# Patient Record
Sex: Female | Born: 1960 | Race: White | Hispanic: No | Smoking: Current every day smoker
Health system: Southern US, Community
[De-identification: ages and names within clinical notes are randomized; demographics above are authoritative.]

## PROBLEM LIST (undated history)

## (undated) DIAGNOSIS — K219 Gastro-esophageal reflux disease without esophagitis: Secondary | ICD-10-CM

## (undated) DIAGNOSIS — J449 Chronic obstructive pulmonary disease, unspecified: Secondary | ICD-10-CM

## (undated) DIAGNOSIS — G8929 Other chronic pain: Secondary | ICD-10-CM

## (undated) DIAGNOSIS — Z860100 Personal history of colon polyps, unspecified: Secondary | ICD-10-CM

## (undated) DIAGNOSIS — I10 Essential (primary) hypertension: Secondary | ICD-10-CM

## (undated) DIAGNOSIS — M169 Osteoarthritis of hip, unspecified: Secondary | ICD-10-CM

## (undated) DIAGNOSIS — E119 Type 2 diabetes mellitus without complications: Secondary | ICD-10-CM

## (undated) DIAGNOSIS — J45909 Unspecified asthma, uncomplicated: Secondary | ICD-10-CM

## (undated) DIAGNOSIS — C801 Malignant (primary) neoplasm, unspecified: Secondary | ICD-10-CM

## (undated) DIAGNOSIS — Z972 Presence of dental prosthetic device (complete) (partial): Secondary | ICD-10-CM

## (undated) DIAGNOSIS — Z8709 Personal history of other diseases of the respiratory system: Secondary | ICD-10-CM

## (undated) DIAGNOSIS — Z720 Tobacco use: Secondary | ICD-10-CM

## (undated) DIAGNOSIS — I5030 Unspecified diastolic (congestive) heart failure: Secondary | ICD-10-CM

## (undated) DIAGNOSIS — E785 Hyperlipidemia, unspecified: Secondary | ICD-10-CM

## (undated) DIAGNOSIS — R002 Palpitations: Secondary | ICD-10-CM

## (undated) DIAGNOSIS — R0600 Dyspnea, unspecified: Secondary | ICD-10-CM

## (undated) DIAGNOSIS — R531 Weakness: Secondary | ICD-10-CM

## (undated) DIAGNOSIS — I739 Peripheral vascular disease, unspecified: Secondary | ICD-10-CM

## (undated) DIAGNOSIS — Z8619 Personal history of other infectious and parasitic diseases: Secondary | ICD-10-CM

## (undated) DIAGNOSIS — M549 Dorsalgia, unspecified: Secondary | ICD-10-CM

## (undated) HISTORY — PX: COLONOSCOPY: SHX174

## (undated) HISTORY — DX: Essential (primary) hypertension: I10

## (undated) HISTORY — PX: ABDOMINAL HYSTERECTOMY: SHX81

## (undated) HISTORY — DX: Osteoarthritis of hip, unspecified: M16.9

## (undated) HISTORY — DX: Hyperlipidemia, unspecified: E78.5

## (undated) HISTORY — PX: APPENDECTOMY: SHX54

## (undated) HISTORY — PX: BACK SURGERY: SHX140

## (undated) HISTORY — PX: BREAST BIOPSY: SHX20

## (undated) HISTORY — PX: TOTAL ABDOMINAL HYSTERECTOMY W/ BILATERAL SALPINGOOPHORECTOMY: SHX83

## (undated) HISTORY — PX: EYE SURGERY: SHX253

---

## 2007-07-02 ENCOUNTER — Ambulatory Visit: Payer: Self-pay

## 2007-07-09 ENCOUNTER — Ambulatory Visit: Payer: Self-pay

## 2007-08-15 ENCOUNTER — Emergency Department: Payer: Self-pay | Admitting: Emergency Medicine

## 2010-02-23 ENCOUNTER — Ambulatory Visit: Payer: Self-pay | Admitting: Family Medicine

## 2010-02-23 DIAGNOSIS — F17219 Nicotine dependence, cigarettes, with unspecified nicotine-induced disorders: Secondary | ICD-10-CM | POA: Insufficient documentation

## 2010-03-08 ENCOUNTER — Ambulatory Visit: Payer: Self-pay | Admitting: Internal Medicine

## 2010-03-08 DIAGNOSIS — R519 Headache, unspecified: Secondary | ICD-10-CM | POA: Insufficient documentation

## 2010-03-08 DIAGNOSIS — R51 Headache: Secondary | ICD-10-CM | POA: Insufficient documentation

## 2010-05-04 NOTE — Assessment & Plan Note (Signed)
Summary: COLD/JBB   Vital Signs:  Patient Profile:   50 Years Old Female CC:      Cold & URI symptoms Height:     67 inches Weight:      216 pounds BMI:     33.95 O2 Sat:      95 % O2 treatment:    Room Air Temp:     97.4 degrees F oral Pulse rate:   93 / minute Pulse rhythm:   regular Resp:     20 per minute BP sitting:   129 / 91  (right arm)  Pt. in pain?   no  Vitals Entered By: Levonne Spiller EMT-P (February 23, 2010 12:40 PM)              Is Patient Diabetic? No Comments Pt. is a smoker. 1 pack per day.      Current Allergies: No known allergies History of Present Illness History from: patient Chief Complaint: Cold & URI symptoms History of Present Illness: The patient is reporting that she has had a "cold" for the last 2 weeks and her husband made her come in to get evaluated because she was coughing so much that he couldn't sleep.  She says that she is coughing up yellow sputum and denies fever and chills.  She is having some mild SOB.  No CP.  No emesis.  Pt says that she has been wheezing and has not been using her albuterol and advair that she has at home.  She has fallen out with her PCP and says that she may not be following up there.  She says that they want her to come in too regularly for medical care and treatment.  She has a history of smoking 1ppd of cigarettes for 37+ years.  She denies having a history of COPD/Emphysema but no records are available to confirm/verify.    REVIEW OF SYSTEMS Constitutional Symptoms      Denies fever, chills, night sweats, weight loss, weight gain, and fatigue.  Eyes       Denies change in vision, eye pain, eye discharge, glasses, contact lenses, and eye surgery. Ear/Nose/Throat/Mouth       Complains of frequent runny nose and hoarseness.      Denies hearing loss/aids, change in hearing, ear pain, ear discharge, dizziness, frequent nose bleeds, sinus problems, sore throat, and tooth pain or bleeding.  Respiratory  Complains of productive cough, wheezing, and shortness of breath.      Denies dry cough, asthma, bronchitis, and emphysema/COPD.      Comments: Colored Sputum Cardiovascular       Denies murmurs, chest pain, and tires easily with exhertion.    Gastrointestinal       Denies stomach pain, nausea/vomiting, diarrhea, constipation, blood in bowel movements, and indigestion. Genitourniary       Denies painful urination, kidney stones, and loss of urinary control. Neurological       Denies paralysis, seizures, and fainting/blackouts. Musculoskeletal       Denies muscle pain, joint pain, joint stiffness, decreased range of motion, redness, swelling, muscle weakness, and gout.  Skin       Denies bruising, unusual mles/lumps or sores, and hair/skin or nail changes.  Psych       Denies mood changes, temper/anger issues, anxiety/stress, speech problems, depression, and sleep problems.  Past History:  Family History: Last updated: 02/23/2010 Pt denies.  Social History: Last updated: 02/23/2010 Married Current Smoker - 1 ppd for 37+ years Drug use-no Regular  exercise-no  Risk Factors: Exercise: no (02/23/2010)  Risk Factors: Smoking Status: current (02/23/2010)  Past Medical History: ? COPD Chronic Active Nicotine Dependence  Past Surgical History: Denies surgical history  Family History: Pt denies.  Social History: Married Current Smoker - 1 ppd for 37+ years Drug use-no Regular exercise-no Smoking Status:  current Drug Use:  no Does Patient Exercise:  no Physical Exam General appearance: well developed, well nourished, no acute distress Head: normocephalic, atraumatic Eyes: conjunctivae and lids normal Pupils: equal, round, reactive to light Ears: normal, no lesions or deformities Nasal: pale, boggy, swollen nasal turbinates Oral/Pharynx: tongue normal, posterior pharynx without erythema or exudate Neck: neck supple,  trachea midline, no masses Chest/Lungs:  scattered wheezes throughout all lobes, rales at both bases Heart: regular rate and  rhythm, no murmur Abdomen: soft, non-tender without obvious organomegaly Extremities: normal extremities Neurological: grossly intact and non-focal Skin: no obvious rashes or lesions MSE: oriented to time, place, and person Assessment Problems:   New Problems: ? of OBSTRUCTIVE CHRONIC BRONCHITIS WITH EXACERBATION (ICD-491.21) ALLERGIC RHINITIS, SEASONAL (ICD-477.0) ACUTE BRONCHITIS (ICD-466.0) CIGARETTE SMOKER (ICD-305.1)   Patient Education: Patient and/or caregiver instructed in the following: rest, fluids. The risks, benefits and possible side effects were clearly explained and discussed with the patient.  The patient verbalized clear understanding.  The patient was given instructions to return if symptoms don't improve, worsen or new changes develop.  If it is not during clinic hours and the patient cannot get back to this clinic then the patient was told to seek medical care at an available urgent care or emergency department.  The patient verbalized understanding.   Demonstrates willingness to comply.  Plan New Medications/Changes: MEDROL (PAK) 4 MG TABS (METHYLPREDNISOLONE) take 1 dose pak as directed  #1 pak x 0, 02/23/2010, Clanford Johnson MD DOXYCYCLINE HYCLATE 100 MG CAPS (DOXYCYCLINE HYCLATE) take 1 by mouth two times a day with food until completed - avoid heavy sun exposure  #14 x 0, 02/23/2010, Standley Dakins MD  New Orders: Nebulizer Tx [phone] Planning Comments:   The patient was counseled and advised to stop using all tobacco products.  Medical assistance was offered and the patient was encouraged to call 1-800-QUIT-NOW to get a smoking cessation coach.    Follow Up: Follow up in 1-2 days if no improvement, Follow up on an as needed basis, Follow up with Primary Physician  The patient and/or caregiver has been counseled thoroughly with regard to medications prescribed including  dosage, schedule, interactions, rationale for use, and possible side effects and they verbalize understanding.  Diagnoses and expected course of recovery discussed and will return if not improved as expected or if the condition worsens. Patient and/or caregiver verbalized understanding.  Prescriptions: MEDROL (PAK) 4 MG TABS (METHYLPREDNISOLONE) take 1 dose pak as directed  #1 pak x 0   Entered and Authorized by:   Standley Dakins MD   Signed by:   Standley Dakins MD on 02/23/2010   Method used:   Electronically to        [facility] (retail)       [address]       [place], [place]  [address]       Ph: [phone]       Fax: [phone]   RxID:   [id number] DOXYCYCLINE HYCLATE 100 MG CAPS (DOXYCYCLINE HYCLATE) take 1 by mouth two times a day with food until completed - avoid heavy sun exposure  #14  x 0   Entered and Authorized by:   Standley Dakins MD   Signed by:   Standley Dakins MD on 02/23/2010   Method used:   Electronically to        [facility] (retail)       [address]       [place], [place]  [address]       Ph: [phone]       Fax: [phone]   RxID:   [id number]   The patient received 2 nebulizer treatments with albuterol/ipratropium solution and tolerated the treatments very well.  I reexamined the patient after the treatments and her pulse ox improved to 96-97% and lungs were clear and coughing stopped.    Patient Instructions: 1)  Tobacco is very bad for your health and your loved ones! You Should stop smoking!. 2)  Stop Smoking Tips: Choose a Quit date. Cut down before the Quit date. decide what you will do as a substitute when you feel the urge to smoke(gum,toothpick,exercise). 3)  Take your antibiotic as prescribed until ALL of it is gone, but stop if you develop a rash or swelling and contact our office as soon as possible. 4)  The patient was encouraged to get a flu  vaccine when she is feeling better.  The patient verbalized clear understanding.   5)  The patient was informed that there is no on-call provider or services available at this clinic during off-hours (when the clinic is closed).  If the patient developed a problem or concern that required immediate attention, the patient was advised to go the the nearest available urgent care or emergency department for medical care.  The patient verbalized understanding.    6)  It was clearly explained to the patient that this [facility] is not intended to be a primary care clinic.  The patient is always better served by the continuity of care and the provider/patient relationships developed with their dedicated primary care provider.  The patient was told to be sure to follow up as soon as possible with their primary care provider to discuss treatments received and to receive further examination and testing.  The patient verbalized understanding. The will f/u with PCP ASAP.   Orders Added: 1)  [facility]x [phone]

## 2010-05-04 NOTE — Assessment & Plan Note (Signed)
Summary: BAD COLD/EVM   Vital Signs:  Patient profile:   50 year old female Height:      67.5 inches Weight:      217 pounds BMI:     33.61 O2 Sat:      98 % on Room air Temp:     97.7 degrees F oral Pulse rate:   85 / minute Pulse rhythm:   regular Resp:     20 per minute BP sitting:   146 / 87  (right arm)  Vitals Entered By: Levonne Spiller EMT-P (March 08, 2010 11:20 AM) CC: URI/ Cold Syptoms Is Patient Diabetic? No Pain Assessment Patient in pain? no       Does patient need assistance? Functional Status Self care Ambulation Normal Comments Pt. Is A Smoker. 1 Pack Per Day.   CC:  URI/ Cold Syptoms.  Allergies (verified): No Known Drug Allergies  Past History:  Past Medical History: Last updated: 02/23/2010 ? COPD Chronic Active Nicotine Dependence  Past Surgical History: back surg  Family History: denies  Social History: Married Current Smoker - 1 ppd for 37+ years Drug use-no Regular exercise-no later says she tries to walk 1 mile daily disabled to to back problems on diet and has lost from max of 226 lbs  Review of Systems       The patient complains of weight loss, hoarseness, dyspnea on exertion, prolonged cough, and headaches.  The patient denies anorexia, fever, weight gain, chest pain, syncope, peripheral edema, hemoptysis, abdominal pain, melena, hematochezia, and severe indigestion/heartburn.   General:  Denies chills, fatigue, fever, loss of appetite, malaise, sleep disorder, sweats, weakness, and weight loss. Eyes:  Complains of blurring; denies discharge, double vision, eye irritation, eye pain, halos, itching, light sensitivity, red eye, vision loss-1 eye, and vision loss-both eyes; chronic and unchanged . ENT:  Complains of hoarseness, nasal congestion, and sore throat; nasal worse if supine requiring 4 pillows for 4 mos. denies sob if mouth breathing. CV:  Complains of difficulty breathing at night, difficulty breathing while lying  down, fatigue, and shortness of breath with exertion; denies bluish discoloration of lips or nails, chest pain or discomfort, fainting, leg cramps with exertion, lightheadness, near fainting, palpitations, swelling of feet, swelling of hands, and weight gain; fatigue chronic and unchanged   . Resp:  Complains of cough, morning headaches, sputum productive, and wheezing; denies chest discomfort, chest pain with inspiration, coughing up blood, excessive snoring, hypersomnolence, pleuritic, and shortness of breath; green sputum. denies dx of emphysema or copd in past. GI:  Denies abdominal pain, bloody stools, change in bowel habits, constipation, dark tarry stools, diarrhea, excessive appetite, gas, hemorrhoids, indigestion, loss of appetite, nausea, vomiting, vomiting blood, and yellowish skin color. GU:  Denies abnormal vaginal bleeding, decreased libido, discharge, dysuria, genital sores, hematuria, incontinence, nocturia, urinary frequency, and urinary hesitancy. MS:  Complains of low back pain; denies joint pain, joint redness, joint swelling, loss of strength, mid back pain, muscle aches, muscle , cramps, muscle weakness, stiffness, and thoracic pain; chronic and unchanged . Derm:  Denies changes in color of skin, changes in nail beds, dryness, excessive perspiration, flushing, hair loss, insect bite(s), itching, lesion(s), poor wound healing, and rash. Neuro:  Complains of headaches; denies brief paralysis, difficulty with concentration, disturbances in coordination, falling down, inability to speak, memory loss, numbness, poor balance, seizures, sensation of room spinning, tingling, tremors, visual disturbances, and weakness; generalized, responds to tylenol. 6/day max. Psych:  Denies alternate hallucination ( auditory/visual), anxiety, depression,  easily angered, easily tearful, irritability, mental problems, panic attacks, sense of great danger, suicidal thoughts/plans, thoughts of violence, unusual  visions or sounds, and thoughts /plans of harming others. Endo:  Denies cold intolerance, excessive hunger, excessive thirst, excessive urination, heat intolerance, polyuria, and weight change. Heme:  Denies abnormal bruising, bleeding, enlarge lymph nodes, fevers, pallor, and skin discoloration. Allergy:  Denies hives or rash, itching eyes, persistent infections, seasonal allergies, and sneezing; denies dx of allergies and prior seasons OK.  Physical Exam  General:  alert, well-developed, well-hydrated, appropriate dress, cooperative to examination, and overweight-appearing.   Head:  normocephalic and atraumatic.   Eyes:  no injection.   Ears:  External ear exam shows no significant lesions or deformities.  Otoscopic examination reveals clear canals, tympanic membranes are intact bilaterally without bulging, retraction, inflammation or discharge. Hearing is grossly normal bilaterally. Nose:  External nasal examination shows no deformity or inflammation. Nasal mucosa are pink and moist without lesions or exudates. Mouth:  Oral mucosa and oropharynx without lesions or exudates.   Neck:  No deformities, masses, or tenderness noted. Chest Wall:  No deformities, noted. Lungs:  normal respiratory effort and normal breath sounds.   Extremities:  No clubbing, cyanosis, edema, or deformity noted .   Neurologic:  No cranial nerve deficits noted. Station and gait are normal. motor and coordinative functions appear intact. Skin:  turgor normal and color normal.   Cervical Nodes:  No lymphadenopathy noted Psych:  Cognition and judgment appear intact. Alert and cooperative with normal attention span and concentration. No apparent delusions, illusions, hallucinations. patient admits she will never try to stop smoking due to past failures   Problems:  Medical Problems Added: 1)  Dx of Headache  (ICD-784.0) 2)  Dx of Cough  (ICD-786.2)  Impression & Recommendations:  Problem # 1:  COUGH  (ICD-786.2) patient is better after medrol and doxy but still worse than baseline and denies smokers cough. thinks her husband has given her back his new resp infection. Denies knowledge of copd and understands the natural progression of this if smoking continues.  Problem # 2:  HEADACHE (ICD-784.0) admits she get this with acute illness and sometimes related to chronic back pain. ha is less severe with less coughing. denies hbp dx.  Complete Medication List: 1)  Azithromycin 250 Mg Tabs (Azithromycin) .... 2 then 1 by mouth qd  Patient Instructions: 1)  coricidin hbp as needed. 2)  afrin generic 2 sprays in each nostril every 12 hours for 3 days maximum followed by 3 days of non-use. Continue cycle as needed to control nasal or ear congestion . 3)  robitussin dm or delsym. 4)  continue tylenol. 5)  get blood pressure rechecked and a flu shot when better 6)  Please schedule an appointment with your primary doctor in :10 days if not well. Prescriptions: AZITHROMYCIN 250 MG TABS (AZITHROMYCIN) 2 then 1 by mouth qd  #6 x 0   Entered and Authorized by:   J. Juline Patch MD   Signed by:   Shela Commons. Juline Patch MD on 03/08/2010   Method used:   Electronically to        [facility] (retail)       [address]       [place], [place]  [address]       Ph: [phone]       Fax: [phone]   RxID:   [id number]

## 2011-06-28 DIAGNOSIS — B958 Unspecified staphylococcus as the cause of diseases classified elsewhere: Secondary | ICD-10-CM | POA: Insufficient documentation

## 2011-06-28 DIAGNOSIS — T847XXA Infection and inflammatory reaction due to other internal orthopedic prosthetic devices, implants and grafts, initial encounter: Secondary | ICD-10-CM | POA: Insufficient documentation

## 2013-07-02 ENCOUNTER — Ambulatory Visit
Admission: RE | Admit: 2013-07-02 | Discharge: 2013-07-02 | Disposition: A | Discharge status: Home / Self Care | Payer: Medicare Other | Source: Ambulatory Visit | Attending: Internal Medicine | Admitting: Internal Medicine

## 2013-07-02 ENCOUNTER — Encounter: Payer: Self-pay | Admitting: Internal Medicine

## 2013-07-02 ENCOUNTER — Ambulatory Visit (INDEPENDENT_AMBULATORY_CARE_PROVIDER_SITE_OTHER): Payer: Medicare Other | Admitting: Internal Medicine

## 2013-07-02 VITALS — BP 131/84 | HR 87 | Temp 98.6°F | Ht 67.0 in | Wt 214.5 lb

## 2013-07-02 DIAGNOSIS — I739 Peripheral vascular disease, unspecified: Secondary | ICD-10-CM | POA: Insufficient documentation

## 2013-07-02 DIAGNOSIS — E785 Hyperlipidemia, unspecified: Secondary | ICD-10-CM | POA: Insufficient documentation

## 2013-07-02 DIAGNOSIS — M539 Dorsopathy, unspecified: Secondary | ICD-10-CM | POA: Insufficient documentation

## 2013-07-02 DIAGNOSIS — M5430 Sciatica, unspecified side: Secondary | ICD-10-CM

## 2013-07-02 DIAGNOSIS — E119 Type 2 diabetes mellitus without complications: Secondary | ICD-10-CM

## 2013-07-02 DIAGNOSIS — Z981 Arthrodesis status: Secondary | ICD-10-CM | POA: Insufficient documentation

## 2013-07-02 DIAGNOSIS — I10 Essential (primary) hypertension: Secondary | ICD-10-CM

## 2013-07-02 DIAGNOSIS — T8140XA Infection following a procedure, unspecified, initial encounter: Secondary | ICD-10-CM | POA: Insufficient documentation

## 2013-07-02 LAB — COMPREHENSIVE METABOLIC PANEL
ALK PHOS: 83 U/L (ref 39–117)
ALT: 45 U/L — ABNORMAL HIGH (ref 0–35)
AST: 38 U/L — ABNORMAL HIGH (ref 0–37)
Albumin: 4.6 g/dL (ref 3.5–5.2)
BUN: 13 mg/dL (ref 6–23)
CALCIUM: 9.8 mg/dL (ref 8.4–10.5)
CO2: 25 mEq/L (ref 19–32)
Chloride: 101 mEq/L (ref 96–112)
Creat: 0.79 mg/dL (ref 0.50–1.10)
GLUCOSE: 97 mg/dL (ref 70–99)
Potassium: 4.8 mEq/L (ref 3.5–5.3)
Sodium: 136 mEq/L (ref 135–145)
TOTAL PROTEIN: 7.1 g/dL (ref 6.0–8.3)
Total Bilirubin: 0.3 mg/dL (ref 0.2–1.2)

## 2013-07-02 LAB — CBC
HEMATOCRIT: 40.2 % (ref 36.0–46.0)
Hemoglobin: 13.8 g/dL (ref 12.0–15.0)
MCH: 30.5 pg (ref 26.0–34.0)
MCHC: 34.3 g/dL (ref 30.0–36.0)
MCV: 88.9 fL (ref 78.0–100.0)
Platelets: 338 10*3/uL (ref 150–400)
RBC: 4.52 MIL/uL (ref 3.87–5.11)
RDW: 13.3 % (ref 11.5–15.5)
WBC: 9.9 10*3/uL (ref 4.0–10.5)

## 2013-07-02 LAB — C-REACTIVE PROTEIN: CRP: 0.7 mg/dL — ABNORMAL HIGH (ref <0.60)

## 2013-07-02 LAB — SEDIMENTATION RATE: SED RATE: 10 mm/h (ref 0–22)

## 2013-07-02 MED ORDER — SULFAMETHOXAZOLE-TMP DS 800-160 MG PO TABS: 1.0000 | ORAL_TABLET | Freq: Two times a day (BID) | ORAL | Status: DC

## 2013-07-02 NOTE — Progress Notes (Signed)
Patient ID: Stacey Gross, female   DOB: 1960-04-05, 53 y.o.   MRN: [id number]         [facility]  Reason for Consult: History of postoperative lumbar staph infection Referring Physician: Dr. Golden Pop  Patient Active Problem List   Diagnosis Date Noted  . Sciatica 07/02/2013    Priority: High  . S/P lumbar fusion 07/02/2013    Priority: Medium  . Post-operative infection 07/02/2013    Priority: Medium  . Type II or unspecified type diabetes mellitus without mention of complication, not stated as uncontrolled   . Unspecified essential hypertension   . Other and unspecified hyperlipidemia   . CIGARETTE SMOKER 02/23/2010    Patient's Medications  New Prescriptions   No medications on file  Previous Medications   ACETAMINOPHEN (TYLENOL) 325 MG TABLET    Take 650 mg by mouth every 6 (six) hours as needed.   METFORMIN (GLUCOPHAGE) 500 MG TABLET    Take by mouth 2 (two) times daily.   OMEPRAZOLE (PRILOSEC) 20 MG CAPSULE    Take 30 mg by mouth daily.  Modified Medications   Modified Medication Previous Medication   SULFAMETHOXAZOLE-TRIMETHOPRIM (BACTRIM DS) 800-160 MG PER TABLET sulfamethoxazole-trimethoprim (BACTRIM DS) 800-160 MG per tablet      Take 1 tablet by mouth 2 (two) times daily.    Take 1 tablet by mouth 2 (two) times daily.  Discontinued Medications   FERROUS SULFATE 325 (65 FE) MG TABLET    Take 325 mg by mouth 2 (two) times daily.    Recommendations: 1. Continue trimethoprim sulfamethoxazole for now 2. Check CBC, complete metabolic panel, sedimentation rate and C-reactive protein 3. Lumbar x-rays 4. Obtain medical records from Dr. Lanae Boast and Dr. Almira Coaster  5. Followup in 2 weeks  Assessment: Ms. Stacey Gross has developed acute buttock pain with radiation into both thighs compatible with sciatica. It is unclear if this is related to previous infection that has reactivated or new musculoskeletal problems. Will continue  trimethoprim sulfamethoxazole for now, attempt to obtain her prior medical records, check lab work and lumbar x-rays.   HPI: Stacey Gross is a 53 y.o. female who fell at work in 2010 and sufferred a lumbar spine injury. She underwent lumbar fusion in [place] in 2010 by Dr. Almira Coaster. She said that she developed a staph infection and required repeat surgery 2 weeks later. She was started on IV antibiotics. She recalls being allergic to all of the mycin antibiotics but cannot recall what antibiotic she was treated with. She states that she stayed on IV antibiotics for 6 months postoperatively. She was followed by Dr. Lanae Boast, an infectious disease specialist in [place], until he moved to [place] last summer. She last saw him 8 months ago. He had recommended that she stay on chronic suppressive antibiotic therapy with trimethoprim sulfamethoxazole. She's been on it continuously since completing IV antibiotics 5 years ago. She has not had anyone to refill her antibiotics if she recently started taking it only once daily in fear that she would run out.  About 3 months ago she began having severe pain radiating from her buttocks down her posterior thighs bilaterally. This pain was unlike the pain she had in 2010. She's not had any recent injury that could have precipitated her acute pain. She states that the pain is there all the time and gets worse when she rolls over in bed, stands and walks. It has been getting progressively worse. She had been caring for  her husband who had metastatic lung cancer until he died 2 months ago. She states that prevented her from seeing any doctor about her pain. She has not been taking anything for pain. She states that Tylenol does not work. Her pain can be up to 7/10.  Review of Systems: Constitutional: positive for weight loss, negative for chills, fevers and sweats Eyes: negative Ears, nose, mouth, throat, and face: negative Respiratory:  negative Cardiovascular: negative Gastrointestinal: positive for recent diarrhea that she relates to her metformin Genitourinary:negative    Past Medical History  Diagnosis Date  . Diabetes     History  Substance Use Topics  . Smoking status: Current Every Day Smoker -- 1.00 packs/day for 26 years    Types: Cigarettes  . Smokeless tobacco: Never Used  . Alcohol Use: No    No family history on file. Allergies  Allergen Reactions  . Doxycycline Hyclate   . Vancomycin     OBJECTIVE: Blood pressure 131/84, pulse 87, temperature 98.6 F (37 C), temperature source Oral, height 5\' 7"  (1.702 m), weight 214 lb 8 oz (97.297 kg). General: She is tearful and somewhat uncomfortable due to pain Skin: No rash Oral: No oropharyngeal lesions her teeth in good condition Lungs: Clear Cor: Regular S1 and S2 with no murmurs Abdomen: Obese, soft and nontender Neuro: Strength appears to be intact in all extremities but the exam is somewhat compromised by pain. She is able to stand without assistance and walk across the room Healed lumbar incision without any evidence of active infection  Microbiology: No results found for this or any previous visit (from the past 240 hour(s)).  Michel Bickers, MD [facility] [facility] [phone] pager   [phone] cell 07/02/2013, 2:26 PM

## 2013-07-22 ENCOUNTER — Ambulatory Visit (INDEPENDENT_AMBULATORY_CARE_PROVIDER_SITE_OTHER): Payer: Medicare Other | Admitting: Internal Medicine

## 2013-07-22 VITALS — BP 149/82 | HR 71 | Temp 97.8°F | Wt 217.0 lb

## 2013-07-22 DIAGNOSIS — T8140XA Infection following a procedure, unspecified, initial encounter: Secondary | ICD-10-CM

## 2013-07-22 NOTE — Progress Notes (Signed)
Patient ID: Stacey Gross, female   DOB: Sep 03, 1960, 53 y.o.   MRN: [id number]         [facility]  Patient Active Problem List   Diagnosis Date Noted  . Sciatica 07/02/2013    Priority: High  . S/P lumbar fusion 07/02/2013    Priority: Medium  . Post-operative infection 07/02/2013    Priority: Medium  . Type II or unspecified type diabetes mellitus without mention of complication, not stated as uncontrolled   . Unspecified essential hypertension   . Other and unspecified hyperlipidemia   . CIGARETTE SMOKER 02/23/2010    Patient's Medications  New Prescriptions   No medications on file  Previous Medications   ACETAMINOPHEN (TYLENOL) 325 MG TABLET    Take 650 mg by mouth every 6 (six) hours as needed.   METFORMIN (GLUCOPHAGE) 500 MG TABLET    Take by mouth 2 (two) times daily.   OMEPRAZOLE (PRILOSEC) 20 MG CAPSULE    Take 30 mg by mouth daily.  Modified Medications   No medications on file  Discontinued Medications   SULFAMETHOXAZOLE-TRIMETHOPRIM (BACTRIM DS) 800-160 MG PER TABLET    Take 1 tablet by mouth 2 (two) times daily.    Subjective: Stacey Gross is in for her followup visit. She states her back pain is unchanged since her last visit. It has been worse over the past 3 months but has not changed recently. She states that she is still taking her trimethoprim sulfamethoxazole. She says that she does not like taking medications and would like to try stopping it.  Review of Systems: Pertinent items are noted in HPI.  Past Medical History  Diagnosis Date  . Diabetes     History  Substance Use Topics  . Smoking status: Current Every Day Smoker -- 1.00 packs/day for 26 years    Types: Cigarettes  . Smokeless tobacco: Never Used  . Alcohol Use: No    No family history on file.  Allergies  Allergen Reactions  . Doxycycline Hyclate   . Vancomycin     Objective: Temp: 97.8 F (36.6 C) (04/20 1047) Temp src: Oral (04/20 1047) BP:  149/82 mmHg (04/20 1047) Pulse Rate: 71 (04/20 1047)  General: He is talkative and in good spirits Skin: No rash Lungs: Clear Cor: Regular S1 and S2 with no murmurs Abdomen: Soft nontender Lumbar incisions healed  Lab Results Lab Results  Component Value Date   WBC 9.9 07/02/2013   HGB 13.8 07/02/2013   HCT 40.2 07/02/2013   MCV 88.9 07/02/2013   PLT 338 07/02/2013   CMP     Component Value Date/Time   NA 136 07/02/2013 1422   K 4.8 07/02/2013 1422   CL 101 07/02/2013 1422   CO2 25 07/02/2013 1422   GLUCOSE 97 07/02/2013 1422   BUN 13 07/02/2013 1422   CREATININE 0.79 07/02/2013 1422   CALCIUM 9.8 07/02/2013 1422   PROT 7.1 07/02/2013 1422   ALBUMIN 4.6 07/02/2013 1422   AST 38* 07/02/2013 1422   ALT 45* 07/02/2013 1422   ALKPHOS 83 07/02/2013 1422   BILITOT 0.3 07/02/2013 1422   Lab Results  Component Value Date   CRP 0.7* 07/02/2013   Lab Results  Component Value Date   ESRSEDRATE 10 07/02/2013     Assessment: Her lab work and x-rays are unremarkable. I cannot determine if her previous lumbar infection has been cured or if it is simply suppressed with chronic trimethoprim sulfamethoxazole therapy. She would like to try  stopping it now to see how she does.  Plan: 1. Discontinue trimethoprim-sulfamethoxazole 2. Followup in 6 weeks   Michel Bickers, MD [facility] for [place] [phone] pager   [phone] cell 07/22/2013, 11:03 AM

## 2013-09-02 ENCOUNTER — Ambulatory Visit: Payer: Medicare Other | Admitting: Internal Medicine

## 2013-09-18 ENCOUNTER — Ambulatory Visit (INDEPENDENT_AMBULATORY_CARE_PROVIDER_SITE_OTHER): Payer: Medicare Other | Admitting: Internal Medicine

## 2013-09-18 ENCOUNTER — Encounter: Payer: Self-pay | Admitting: Internal Medicine

## 2013-09-18 VITALS — BP 155/83 | HR 83 | Temp 98.3°F | Wt 220.5 lb

## 2013-09-18 DIAGNOSIS — T8140XA Infection following a procedure, unspecified, initial encounter: Secondary | ICD-10-CM

## 2013-09-18 NOTE — Progress Notes (Signed)
Patient ID: Stacey Gross, female   DOB: 15-Oct-1960, 53 y.o.   MRN: [id number]         [facility] for Infectious Disease  Patient Active Problem List   Diagnosis Date Noted  . Claudication 07/02/2013    Priority: High  . S/P lumbar fusion 07/02/2013    Priority: Medium  . Post-operative infection 07/02/2013    Priority: Medium  . Type II or unspecified type diabetes mellitus without mention of complication, not stated as uncontrolled   . Unspecified essential hypertension   . Other and unspecified hyperlipidemia   . CIGARETTE SMOKER 02/23/2010    Patient's Medications  New Prescriptions   No medications on file  Previous Medications   IBUPROFEN (ADVIL,MOTRIN) 200 MG TABLET    Take 200 mg by mouth every 6 (six) hours as needed.   METFORMIN (GLUCOPHAGE) 500 MG TABLET    Take by mouth 2 (two) times daily.   OMEPRAZOLE (PRILOSEC) 20 MG CAPSULE    Take 30 mg by mouth daily.  Modified Medications   No medications on file  Discontinued Medications   ACETAMINOPHEN (TYLENOL) 325 MG TABLET    Take 650 mg by mouth every 6 (six) hours as needed.    Subjective: Stacey Gross is in for her routine visit. She stopped taking her chronic, suppressive trimethoprim sulfamethoxazole after her last visit with me on April 20. She has not noted any change in her chronic leg pain. She is not having any back pain. She states her pain is worse when she walks. It improves if she stops and rests. She has not any fever, chills or sweats. She is down to smoking one pack of cigarettes daily. She previously smoked 2 packs. She has started using and he cigarette but has no current plans to quit. She has quit for up to one month in the past. She was recently seen by her home health nurse who told her that her cholesterol was "skyhigh". She recalls her last hemoglobin A1c was normal. Her blood sugars at home have been running around 175 recently.  Review of Systems: Constitutional: negative Eyes:  negative Ears, nose, mouth, throat, and face: negative Respiratory: negative Cardiovascular: negative Gastrointestinal: negative Genitourinary:negative Musculoskeletal:positive for calf pain when walking, negative for back pain and muscle weakness Neurological: negative for coordination problems, gait problems, paresthesia and weakness  Past Medical History  Diagnosis Date  . Diabetes   . Hyperlipidemia     History  Substance Use Topics  . Smoking status: Current Every Day Smoker -- 1.00 packs/day for 26 years    Types: Cigarettes  . Smokeless tobacco: Never Used  . Alcohol Use: No    Family History  Problem Relation Age of Onset  . Hyperlipidemia Mother   . Hyperlipidemia Father     Allergies  Allergen Reactions  . Doxycycline Hyclate   . Vancomycin   . Latex Rash    Objective: Temp: 98.3 F (36.8 C) (06/17 1012) Temp src: Oral (06/17 1012) BP: 155/83 mmHg (06/17 1012) Pulse Rate: 83 (06/17 1012)  General: She is in no distress  Skin: No rash  Oral: No oropharyngeal lesions her teeth in good condition  Lungs: Clear  Cor: Regular S1 and S2 with no murmurs  Abdomen: Obese, soft and nontender  Neuro: Strength appears to be intact in all extremities. Healed lumbar incision without any evidence of active infection  Assessment: She's had no change in her leg pain and no recurrence of back pain since stopping chronic, suppressive trimethoprim  sulfamethoxazole 2 months ago. I think it is safe to say that she does not have any active infection and I see no need for further diagnostic testing or antibiotic treatment.  It sounds like she has claudication. She is due to see her primary care physician, Dr. Jeananne Rama next month. I talked to her at length about the importance of cigarette cessation and gave her written information about the [place] quit line.  We'll also be very important for her to maintain optimal glycemic control and to get her dyslipidemia under  better control.  Plan: 1. Continue observation off of antibiotics 2. Followup here as needed 3. Cigarette cessation counseling provided   Michel Bickers, MD [facility] for [facility] [place] [facility] [phone] pager   [phone] cell 09/18/2013, 10:35 AM

## 2013-11-07 ENCOUNTER — Ambulatory Visit: Payer: Self-pay | Admitting: Family Medicine

## 2013-11-14 ENCOUNTER — Ambulatory Visit: Payer: Self-pay | Admitting: Family Medicine

## 2014-02-23 ENCOUNTER — Emergency Department: Payer: Self-pay | Admitting: Emergency Medicine

## 2014-05-13 DIAGNOSIS — E119 Type 2 diabetes mellitus without complications: Secondary | ICD-10-CM | POA: Diagnosis not present

## 2014-05-13 DIAGNOSIS — I10 Essential (primary) hypertension: Secondary | ICD-10-CM | POA: Diagnosis not present

## 2014-05-13 DIAGNOSIS — E785 Hyperlipidemia, unspecified: Secondary | ICD-10-CM | POA: Diagnosis not present

## 2014-05-13 DIAGNOSIS — Z23 Encounter for immunization: Secondary | ICD-10-CM | POA: Diagnosis not present

## 2014-08-07 DIAGNOSIS — E785 Hyperlipidemia, unspecified: Secondary | ICD-10-CM | POA: Diagnosis not present

## 2014-08-07 DIAGNOSIS — I10 Essential (primary) hypertension: Secondary | ICD-10-CM | POA: Diagnosis not present

## 2014-08-07 DIAGNOSIS — E119 Type 2 diabetes mellitus without complications: Secondary | ICD-10-CM | POA: Diagnosis not present

## 2014-09-04 ENCOUNTER — Other Ambulatory Visit: Payer: Self-pay | Admitting: Family Medicine

## 2014-09-04 ENCOUNTER — Telehealth: Payer: Self-pay

## 2014-09-04 MED ORDER — SULFAMETHOXAZOLE-TRIMETHOPRIM 400-80 MG PO TABS: 1.0000 | ORAL_TABLET | Freq: Two times a day (BID) | ORAL | Status: DC

## 2014-09-04 NOTE — Telephone Encounter (Signed)
Call re sulfa meds

## 2014-09-04 NOTE — Telephone Encounter (Signed)
Patient's phone is disconnected.

## 2014-09-04 NOTE — Addendum Note (Signed)
Addended byGolden Pop on: 09/04/2014 05:39 PM   Modules accepted: Orders

## 2014-09-04 NOTE — Telephone Encounter (Signed)
rx given

## 2014-10-21 DIAGNOSIS — E1159 Type 2 diabetes mellitus with other circulatory complications: Secondary | ICD-10-CM | POA: Insufficient documentation

## 2014-10-21 DIAGNOSIS — Z87891 Personal history of nicotine dependence: Secondary | ICD-10-CM | POA: Insufficient documentation

## 2014-10-21 DIAGNOSIS — M4326 Fusion of spine, lumbar region: Secondary | ICD-10-CM | POA: Insufficient documentation

## 2014-10-21 DIAGNOSIS — E1169 Type 2 diabetes mellitus with other specified complication: Secondary | ICD-10-CM | POA: Insufficient documentation

## 2014-10-21 DIAGNOSIS — M169 Osteoarthritis of hip, unspecified: Secondary | ICD-10-CM | POA: Insufficient documentation

## 2014-10-21 DIAGNOSIS — G8918 Other acute postprocedural pain: Secondary | ICD-10-CM | POA: Insufficient documentation

## 2014-10-21 DIAGNOSIS — E119 Type 2 diabetes mellitus without complications: Secondary | ICD-10-CM | POA: Insufficient documentation

## 2014-10-21 DIAGNOSIS — I10 Essential (primary) hypertension: Secondary | ICD-10-CM | POA: Insufficient documentation

## 2014-10-21 DIAGNOSIS — E785 Hyperlipidemia, unspecified: Secondary | ICD-10-CM | POA: Insufficient documentation

## 2014-10-21 DIAGNOSIS — M549 Dorsalgia, unspecified: Secondary | ICD-10-CM | POA: Insufficient documentation

## 2014-10-22 ENCOUNTER — Encounter: Payer: Self-pay | Admitting: Family Medicine

## 2014-11-03 ENCOUNTER — Other Ambulatory Visit: Payer: Self-pay | Admitting: Family Medicine

## 2014-11-18 ENCOUNTER — Other Ambulatory Visit: Payer: Self-pay | Admitting: Family Medicine

## 2014-12-30 ENCOUNTER — Encounter: Payer: Self-pay | Admitting: Family Medicine

## 2014-12-30 ENCOUNTER — Ambulatory Visit (INDEPENDENT_AMBULATORY_CARE_PROVIDER_SITE_OTHER): Payer: Medicare Other | Admitting: Family Medicine

## 2014-12-30 VITALS — BP 95/65 | HR 82 | Temp 97.5°F | Ht 66.8 in | Wt 204.0 lb

## 2014-12-30 DIAGNOSIS — K219 Gastro-esophageal reflux disease without esophagitis: Secondary | ICD-10-CM

## 2014-12-30 DIAGNOSIS — E119 Type 2 diabetes mellitus without complications: Secondary | ICD-10-CM | POA: Diagnosis not present

## 2014-12-30 DIAGNOSIS — Z720 Tobacco use: Secondary | ICD-10-CM | POA: Diagnosis not present

## 2014-12-30 DIAGNOSIS — I10 Essential (primary) hypertension: Secondary | ICD-10-CM | POA: Diagnosis not present

## 2014-12-30 DIAGNOSIS — F329 Major depressive disorder, single episode, unspecified: Secondary | ICD-10-CM

## 2014-12-30 DIAGNOSIS — T847XXD Infection and inflammatory reaction due to other internal orthopedic prosthetic devices, implants and grafts, subsequent encounter: Secondary | ICD-10-CM

## 2014-12-30 DIAGNOSIS — N3000 Acute cystitis without hematuria: Secondary | ICD-10-CM | POA: Diagnosis not present

## 2014-12-30 DIAGNOSIS — F17200 Nicotine dependence, unspecified, uncomplicated: Secondary | ICD-10-CM

## 2014-12-30 DIAGNOSIS — Z0000 Encounter for general adult medical examination without abnormal findings: Secondary | ICD-10-CM

## 2014-12-30 DIAGNOSIS — E785 Hyperlipidemia, unspecified: Secondary | ICD-10-CM

## 2014-12-30 DIAGNOSIS — F32A Depression, unspecified: Secondary | ICD-10-CM | POA: Insufficient documentation

## 2014-12-30 LAB — URINALYSIS, ROUTINE W REFLEX MICROSCOPIC
Bilirubin, UA: NEGATIVE
KETONES UA: NEGATIVE
Nitrite, UA: NEGATIVE
Protein, UA: NEGATIVE
RBC, UA: NEGATIVE
Specific Gravity, UA: 1.015 (ref 1.005–1.030)
Urobilinogen, Ur: 0.2 mg/dL (ref 0.2–1.0)
pH, UA: 6 (ref 5.0–7.5)

## 2014-12-30 LAB — MICROSCOPIC EXAMINATION: Renal Epithel, UA: NONE SEEN /hpf

## 2014-12-30 LAB — BAYER DCA HB A1C WAIVED: HB A1C (BAYER DCA - WAIVED): 5.8 % (ref <7.0)

## 2014-12-30 MED ORDER — ATORVASTATIN CALCIUM 20 MG PO TABS: 20.0000 mg | ORAL_TABLET | Freq: Every day | ORAL | Status: DC

## 2014-12-30 MED ORDER — METFORMIN HCL 500 MG PO TABS: 1000.0000 mg | ORAL_TABLET | Freq: Two times a day (BID) | ORAL | Status: DC

## 2014-12-30 MED ORDER — SULFAMETHOXAZOLE-TRIMETHOPRIM 800-160 MG PO TABS: 1.0000 | ORAL_TABLET | Freq: Two times a day (BID) | ORAL | Status: DC

## 2014-12-30 MED ORDER — DULOXETINE HCL 60 MG PO CPEP: 60.0000 mg | ORAL_CAPSULE | Freq: Every day | ORAL | Status: DC

## 2014-12-30 MED ORDER — MELOXICAM 15 MG PO TABS: 15.0000 mg | ORAL_TABLET | Freq: Every day | ORAL | Status: DC

## 2014-12-30 MED ORDER — CANAGLIFLOZIN 300 MG PO TABS: 300.0000 mg | ORAL_TABLET | Freq: Every day | ORAL | Status: DC

## 2014-12-30 MED ORDER — OMEPRAZOLE 20 MG PO CPDR: 20.0000 mg | DELAYED_RELEASE_CAPSULE | Freq: Every day | ORAL | Status: DC

## 2014-12-30 MED ORDER — LISINOPRIL 10 MG PO TABS: 10.0000 mg | ORAL_TABLET | Freq: Every day | ORAL | Status: DC

## 2014-12-30 MED ORDER — PIOGLITAZONE HCL 30 MG PO TABS: 30.0000 mg | ORAL_TABLET | Freq: Every day | ORAL | Status: DC

## 2014-12-30 NOTE — Assessment & Plan Note (Signed)
The current medical regimen is effective;  continue present plan and medications.  

## 2014-12-30 NOTE — Assessment & Plan Note (Signed)
No symptoms of infection will continue antibiotics long-term

## 2014-12-30 NOTE — Assessment & Plan Note (Signed)
Encouraged to quit smoking.  

## 2014-12-30 NOTE — Progress Notes (Signed)
BP 95/65 mmHg  Pulse 82  Temp(Src) 97.5 F (36.4 C)  Ht 5' 6.8" (1.697 m)  Wt 204 lb (92.534 kg)  BMI 32.13 kg/m2  SpO2 97%   Subjective:    Patient ID: Stacey Gross, female    DOB: 04-19-1960, 54 y.o.   MRN: [id number]  HPI: Stacey Gross is a 54 y.o. female  Chief Complaint  Patient presents with  . Annual Exam   Patient for physical has been doing well without any specific complaints. 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 Depression nerves pain controlled well with Cymbalta And congratulations on 16 pound weight loss  Relevant past medical, surgical, family and social history reviewed and updated as indicated. Interim medical history since our last visit reviewed. Allergies and medications reviewed and updated.  Review of Systems  Constitutional: Negative.   HENT: Negative.   Eyes: Negative.   Respiratory: Negative.   Cardiovascular: Negative.   Gastrointestinal: Negative.   Endocrine: Negative.   Genitourinary: Negative.   Musculoskeletal: Negative.   Skin: Negative.   Allergic/Immunologic: Negative.   Neurological: Negative.   Hematological: Negative.   Psychiatric/Behavioral: Negative.     Per HPI unless specifically indicated above     Objective:    BP 95/65 mmHg  Pulse 82  Temp(Src) 97.5 F (36.4 C)  Ht 5' 6.8" (1.697 m)  Wt 204 lb (92.534 kg)  BMI 32.13 kg/m2  SpO2 97%  Wt Readings from Last 3 Encounters:  12/30/14 204 lb (92.534 kg)  08/07/14 208 lb (94.348 kg)  09/18/13 220 lb 8 oz (100.018 kg)    Physical Exam  Constitutional: She is oriented to person, place, and time. She appears well-developed and well-nourished.   HENT:  Head: Normocephalic and atraumatic.  Right Ear: External ear normal.  Left Ear: External ear normal.  Nose: Nose normal.  Mouth/Throat: Oropharynx is clear and moist.  Eyes: Conjunctivae and EOM are normal. Pupils are equal, round, and reactive to light.  Neck: Normal range of motion. Neck supple. Carotid bruit is not present.  Cardiovascular: Normal rate, regular rhythm and normal heart sounds.   No murmur heard. Pulmonary/Chest: Effort normal and breath sounds normal.  Abdominal: Soft. Bowel sounds are normal. There is no hepatosplenomegaly.  Musculoskeletal: Normal range of motion.  Neurological: She is alert and oriented to person, place, and time.  Skin: No rash noted.  Psychiatric: She has a normal mood and affect. Her behavior is normal. Judgment and thought content normal.        Assessment & Plan:   Problem List Items Addressed This Visit      Cardiovascular and Mediastinum   Hypertension    The current medical regimen is effective;  continue present plan and medications.       Relevant Medications   atorvastatin (LIPITOR) 20 MG tablet   lisinopril (PRINIVIL,ZESTRIL) 10 MG tablet   Other Relevant Orders   CBC with Differential/Platelet   Comprehensive metabolic panel   TSH   Urinalysis, Routine w reflex microscopic (not at [facility])     Digestive   Acid reflux    The current medical regimen is effective;  continue present plan and medications.       Relevant Medications  omeprazole (PRILOSEC) 20 MG capsule   Other Relevant Orders   Comprehensive metabolic panel     Endocrine   Diabetes   Relevant Medications   atorvastatin (LIPITOR) 20 MG tablet   canagliflozin (INVOKANA) 300 MG TABS tablet   lisinopril (PRINIVIL,ZESTRIL) 10 MG tablet   metFORMIN (GLUCOPHAGE) 500 MG tablet   pioglitazone (ACTOS) 30 MG tablet   Other Relevant Orders   Bayer DCA Hb A1c Waived     Musculoskeletal and Integument   Infection/inflammation due to internal  orthopedic device/implant/graft    No symptoms of infection will continue antibiotics long-term      Relevant Medications   meloxicam (MOBIC) 15 MG tablet   sulfamethoxazole-trimethoprim (BACTRIM DS,SEPTRA DS) 800-160 MG tablet   Other Relevant Orders   CBC with Differential/Platelet   Comprehensive metabolic panel   TSH   Urinalysis, Routine w reflex microscopic (not at  [facility])     Other   CIGARETTE SMOKER    Encouraged to quit smoking      Hyperlipidemia    The current medical regimen is effective;  continue present plan and medications.       Relevant Medications   atorvastatin (LIPITOR) 20 MG tablet   lisinopril (PRINIVIL,ZESTRIL) 10 MG tablet   Other Relevant Orders   Lipid panel   Comprehensive metabolic panel   Depression    The current medical regimen is effective;  continue present plan and medications.       Relevant Medications   DULoxetine (CYMBALTA) 60 MG capsule   Other Relevant Orders   Comprehensive metabolic panel   TSH    Other Visit Diagnoses    PE (physical exam), annual    -  Primary        Follow up plan: Return in about 3 months (around 03/31/2015), or if symptoms worsen or fail to improve, for A1c.

## 2014-12-30 NOTE — Addendum Note (Signed)
Addended byGolden Pop on: 12/30/2014 04:09 PM   Modules accepted: Orders, SmartSet

## 2014-12-31 ENCOUNTER — Telehealth: Payer: Self-pay | Admitting: Family Medicine

## 2014-12-31 LAB — CBC WITH DIFFERENTIAL/PLATELET
BASOS: 1 %
Basophils Absolute: 0.1 10*3/uL (ref 0.0–0.2)
EOS (ABSOLUTE): 0.1 10*3/uL (ref 0.0–0.4)
EOS: 1 %
HEMATOCRIT: 41.7 % (ref 34.0–46.6)
Hemoglobin: 13.9 g/dL (ref 11.1–15.9)
Immature Grans (Abs): 0.1 10*3/uL (ref 0.0–0.1)
Immature Granulocytes: 1 %
LYMPHS ABS: 3.4 10*3/uL — AB (ref 0.7–3.1)
Lymphs: 29 %
MCH: 31.5 pg (ref 26.6–33.0)
MCHC: 33.3 g/dL (ref 31.5–35.7)
MCV: 95 fL (ref 79–97)
MONOS ABS: 0.6 10*3/uL (ref 0.1–0.9)
Monocytes: 5 %
NEUTROS ABS: 7.4 10*3/uL — AB (ref 1.4–7.0)
Neutrophils: 63 %
Platelets: 346 10*3/uL (ref 150–379)
RBC: 4.41 x10E6/uL (ref 3.77–5.28)
RDW: 12.9 % (ref 12.3–15.4)
WBC: 11.6 10*3/uL — ABNORMAL HIGH (ref 3.4–10.8)

## 2014-12-31 LAB — COMPREHENSIVE METABOLIC PANEL
ALT: 10 IU/L (ref 0–32)
AST: 15 IU/L (ref 0–40)
Albumin/Globulin Ratio: 2.1 (ref 1.1–2.5)
Albumin: 4.5 g/dL (ref 3.5–5.5)
Alkaline Phosphatase: 71 IU/L (ref 39–117)
BUN/Creatinine Ratio: 12 (ref 9–23)
BUN: 16 mg/dL (ref 6–24)
Bilirubin Total: 0.3 mg/dL (ref 0.0–1.2)
CALCIUM: 9.9 mg/dL (ref 8.7–10.2)
CO2: 23 mmol/L (ref 18–29)
Chloride: 94 mmol/L — ABNORMAL LOW (ref 97–108)
Creatinine, Ser: 1.35 mg/dL — ABNORMAL HIGH (ref 0.57–1.00)
GFR, EST AFRICAN AMERICAN: 51 mL/min/{1.73_m2} — AB (ref >59)
GFR, EST NON AFRICAN AMERICAN: 44 mL/min/{1.73_m2} — AB (ref >59)
Globulin, Total: 2.1 g/dL (ref 1.5–4.5)
Glucose: 105 mg/dL — ABNORMAL HIGH (ref 65–99)
Potassium: 5.1 mmol/L (ref 3.5–5.2)
Sodium: 132 mmol/L — ABNORMAL LOW (ref 134–144)
TOTAL PROTEIN: 6.6 g/dL (ref 6.0–8.5)

## 2014-12-31 LAB — LIPID PANEL
CHOL/HDL RATIO: 4.5 ratio — AB (ref 0.0–4.4)
Cholesterol, Total: 161 mg/dL (ref 100–199)
HDL: 36 mg/dL — ABNORMAL LOW (ref >39)
LDL Calculated: 74 mg/dL (ref 0–99)
Triglycerides: 256 mg/dL — ABNORMAL HIGH (ref 0–149)
VLDL Cholesterol Cal: 51 mg/dL — ABNORMAL HIGH (ref 5–40)

## 2014-12-31 LAB — TSH: TSH: 1.28 u[IU]/mL (ref 0.450–4.500)

## 2014-12-31 NOTE — Telephone Encounter (Signed)
Phone call Discussed with patient elevated white count for possible UTI will observe Culture and sensitivity pending Patient's GFR declining some will observe patient is not taking Advil and Aleve aspirin will observe Recheck CBC and BMP mid next month Patient indicates she will comply

## 2014-12-31 NOTE — Telephone Encounter (Signed)
-----   Message from Wynn Maudlin, [place] sent at 12/31/2014 12:58 PM EDT ----- labs

## 2015-01-01 ENCOUNTER — Other Ambulatory Visit: Payer: Self-pay | Admitting: Family Medicine

## 2015-01-01 LAB — CULTURE, URINE COMPREHENSIVE

## 2015-01-25 ENCOUNTER — Encounter: Payer: Self-pay | Admitting: Emergency Medicine

## 2015-01-25 ENCOUNTER — Emergency Department
Admission: EM | Admit: 2015-01-25 | Discharge: 2015-01-25 | Disposition: A | Discharge status: Home / Self Care | Payer: Medicare Other | Attending: Emergency Medicine | Admitting: Emergency Medicine

## 2015-01-25 DIAGNOSIS — M5442 Lumbago with sciatica, left side: Secondary | ICD-10-CM | POA: Diagnosis not present

## 2015-01-25 DIAGNOSIS — R319 Hematuria, unspecified: Secondary | ICD-10-CM | POA: Insufficient documentation

## 2015-01-25 DIAGNOSIS — E119 Type 2 diabetes mellitus without complications: Secondary | ICD-10-CM | POA: Insufficient documentation

## 2015-01-25 DIAGNOSIS — Z79899 Other long term (current) drug therapy: Secondary | ICD-10-CM | POA: Diagnosis not present

## 2015-01-25 DIAGNOSIS — G8929 Other chronic pain: Secondary | ICD-10-CM | POA: Diagnosis not present

## 2015-01-25 DIAGNOSIS — Z91040 Latex allergy status: Secondary | ICD-10-CM | POA: Diagnosis not present

## 2015-01-25 DIAGNOSIS — M545 Low back pain: Secondary | ICD-10-CM | POA: Diagnosis not present

## 2015-01-25 DIAGNOSIS — R9431 Abnormal electrocardiogram [ECG] [EKG]: Secondary | ICD-10-CM | POA: Diagnosis not present

## 2015-01-25 DIAGNOSIS — R1110 Vomiting, unspecified: Secondary | ICD-10-CM | POA: Diagnosis not present

## 2015-01-25 DIAGNOSIS — Z720 Tobacco use: Secondary | ICD-10-CM | POA: Diagnosis not present

## 2015-01-25 DIAGNOSIS — Z791 Long term (current) use of non-steroidal anti-inflammatories (NSAID): Secondary | ICD-10-CM | POA: Insufficient documentation

## 2015-01-25 DIAGNOSIS — I10 Essential (primary) hypertension: Secondary | ICD-10-CM | POA: Insufficient documentation

## 2015-01-25 DIAGNOSIS — M5432 Sciatica, left side: Secondary | ICD-10-CM

## 2015-01-25 DIAGNOSIS — R531 Weakness: Secondary | ICD-10-CM | POA: Diagnosis present

## 2015-01-25 DIAGNOSIS — M549 Dorsalgia, unspecified: Secondary | ICD-10-CM

## 2015-01-25 LAB — URINALYSIS COMPLETE WITH MICROSCOPIC (ARMC ONLY)
Bilirubin Urine: NEGATIVE
Glucose, UA: >500 mg/dL — AB
Hgb urine dipstick: NEGATIVE
Ketones, ur: NEGATIVE mg/dL
Nitrite: NEGATIVE
PH: 6 (ref 5.0–8.0)
PROTEIN: NEGATIVE mg/dL
SPECIFIC GRAVITY, URINE: 1.006 (ref 1.005–1.030)

## 2015-01-25 LAB — CBC
HEMATOCRIT: 42.5 % (ref 35.0–47.0)
HEMOGLOBIN: 14.6 g/dL (ref 12.0–16.0)
MCH: 31.5 pg (ref 26.0–34.0)
MCHC: 34.3 g/dL (ref 32.0–36.0)
MCV: 91.8 fL (ref 80.0–100.0)
Platelets: 303 10*3/uL (ref 150–440)
RBC: 4.62 MIL/uL (ref 3.80–5.20)
RDW: 13.3 % (ref 11.5–14.5)
WBC: 9.6 10*3/uL (ref 3.6–11.0)

## 2015-01-25 LAB — COMPREHENSIVE METABOLIC PANEL
ALBUMIN: 4.6 g/dL (ref 3.5–5.0)
ALT: 18 U/L (ref 14–54)
ANION GAP: 8 (ref 5–15)
AST: 21 U/L (ref 15–41)
Alkaline Phosphatase: 77 U/L (ref 38–126)
BUN: 23 mg/dL — ABNORMAL HIGH (ref 6–20)
CO2: 24 mmol/L (ref 22–32)
Calcium: 9.5 mg/dL (ref 8.9–10.3)
Chloride: 104 mmol/L (ref 101–111)
Creatinine, Ser: 1.12 mg/dL — ABNORMAL HIGH (ref 0.44–1.00)
GFR calc non Af Amer: 54 mL/min — ABNORMAL LOW (ref >60)
Glucose, Bld: 115 mg/dL — ABNORMAL HIGH (ref 65–99)
POTASSIUM: 4.5 mmol/L (ref 3.5–5.1)
SODIUM: 136 mmol/L (ref 135–145)
Total Bilirubin: 0.8 mg/dL (ref 0.3–1.2)
Total Protein: 7.4 g/dL (ref 6.5–8.1)

## 2015-01-25 LAB — LIPASE, BLOOD: LIPASE: 27 U/L (ref 11–51)

## 2015-01-25 MED ORDER — KETOROLAC TROMETHAMINE 30 MG/ML IJ SOLN
60.0000 mg | Freq: Once | INTRAMUSCULAR | Status: DC
  Filled 2015-01-25: qty 2

## 2015-01-25 MED ORDER — OXYCODONE-ACETAMINOPHEN 5-325 MG PO TABS: 1.0000 | ORAL_TABLET | Freq: Four times a day (QID) | ORAL | Status: DC | PRN

## 2015-01-25 MED ORDER — KETOROLAC TROMETHAMINE 30 MG/ML IJ SOLN
30.0000 mg | Freq: Once | INTRAMUSCULAR | Status: AC
  Administered 2015-01-25: 30 mg via INTRAVENOUS

## 2015-01-25 NOTE — ED Provider Notes (Addendum)
[facility] Emergency Department Provider Note  ____________________________________________  Time seen: 3:10 PM  I have reviewed the triage vital signs and the nursing notes.   HISTORY  Chief Complaint Weakness and Emesis    HPI Stacey Gross is a 54 y.o. female who complains of worsening left leg pain that is been going on for at least 2 or 3 years, but worsening over the past week. No new trauma or other injuries. No falls. She denies numbness tingling or weakness pallor bladder incontinence or retention. He does state that she noticed some blood in her urine yesterday, but denies any fevers chills dysuria frequency urgency. Has a history of back surgery and hardware infection but denies any new unusual pain at this time.  The back pain is in the left lower back radiating into the posterior left thigh into the knee. It is achy and severe. Worse with movement and weightbearing.     Past Medical History  Diagnosis Date  . Tobacco abuse   . Hyperlipidemia   . Hypertension   . Diabetes ([place])   . Degenerative joint disease (DJD) of hip   . Postoperative back pain   . Fusion of spine of lumbar region   . Infection/inflammation due to internal orthopedic device/implant/graft [facility])      Patient Active Problem List   Diagnosis Date Noted  . Acid reflux 12/30/2014  . Depression 12/30/2014  . Tobacco abuse   . Hyperlipidemia   . Hypertension   . Diabetes ([place])   . Degenerative joint disease (DJD) of hip   . Postoperative back pain   . Fusion of spine of lumbar region   . Infection/inflammation due to internal orthopedic device/implant/graft ([place])   . Claudication ([place]) 07/02/2013  . S/P lumbar fusion 07/02/2013  . Post-operative infection 07/02/2013  . Type II or unspecified type diabetes mellitus without mention of complication, not stated as uncontrolled   . Unspecified essential hypertension   . Other and unspecified hyperlipidemia   .  CIGARETTE SMOKER 02/23/2010     Past Surgical History  Procedure Laterality Date  . Appendectomy    . Abdominal hysterectomy      total  . Spine surgery  2010     Current Outpatient Rx  Name  Route  Sig  Dispense  Refill  . atorvastatin (LIPITOR) 20 MG tablet   Oral   Take 1 tablet (20 mg total) by mouth at bedtime.   90 tablet   4   . canagliflozin (INVOKANA) 300 MG TABS tablet   Oral   Take 300 mg by mouth daily before breakfast.   90 tablet   4   . DULoxetine (CYMBALTA) 60 MG capsule   Oral   Take 1 capsule (60 mg total) by mouth daily.   90 capsule   4   . lisinopril (PRINIVIL,ZESTRIL) 10 MG tablet   Oral   Take 1 tablet (10 mg total) by mouth daily.   90 tablet   4   . meloxicam (MOBIC) 15 MG tablet   Oral   Take 1 tablet (15 mg total) by mouth daily.   90 tablet   4   . metFORMIN (GLUCOPHAGE) 500 MG tablet   Oral   Take 2 tablets (1,000 mg total) by mouth 2 (two) times daily.   180 tablet   4   . omeprazole (PRILOSEC) 20 MG capsule   Oral   Take 1 capsule (20 mg total) by mouth daily.   90 capsule  4   . pioglitazone (ACTOS) 30 MG tablet   Oral   Take 1 tablet (30 mg total) by mouth daily.   90 tablet   4   . sitaGLIPtin (JANUVIA) 100 MG tablet   Oral   Take 100 mg by mouth daily.         Marland Kitchen sulfamethoxazole-trimethoprim (BACTRIM DS,SEPTRA DS) 800-160 MG tablet   Oral   Take 1 tablet by mouth 2 (two) times daily. Patient taking differently: Take 1 tablet by mouth 3 (three) times daily.    180 tablet   4   . oxyCODONE-acetaminophen (ROXICET) 5-325 MG tablet   Oral   Take 1 tablet by mouth every 6 (six) hours as needed for severe pain.   6 tablet   0      Allergies Doxycycline hyclate; Latex; and Vancomycin   Family History  Problem Relation Age of Onset  . Hyperlipidemia Mother   . Hyperlipidemia Father   . Cancer Sister     melanoma    Social History Social History  Substance Use Topics  . Smoking status:  Current Every Day Smoker -- 1.00 packs/day for 26 years    Types: Cigarettes  . Smokeless tobacco: Never Used  . Alcohol Use: No   Denies drug use Review of Systems  Constitutional:   No fever or chills. No weight changes Eyes:   No blurry vision or double vision.  ENT:   No sore throat. Cardiovascular:   No chest pain. Respiratory:   No dyspnea or cough. Gastrointestinal:   Negative for abdominal pain, vomiting and diarrhea.  No BRBPR or melena. Genitourinary:   Negative for dysuria, urinary retention, or difficulty urinating. Positive hematuria Musculoskeletal:   Positive chronic back pain and left leg pain  Skin:   Negative for rash. Neurological:   Negative for headaches, focal weakness or numbness. Psychiatric:  No anxiety or depression.   Endocrine:  No hot/cold intolerance, changes in energy, or sleep difficulty.  10-point ROS otherwise negative.  ____________________________________________   PHYSICAL EXAM:  VITAL SIGNS: ED Triage Vitals  Enc Vitals Group     BP 01/25/15 1412 121/87 mmHg     Pulse Rate 01/25/15 1412 77     Resp 01/25/15 1412 18     Temp 01/25/15 1412 97.5 F (36.4 C)     Temp Source 01/25/15 1412 Oral     SpO2 01/25/15 1412 98 %     Weight 01/25/15 1412 186 lb (84.369 kg)     Height 01/25/15 1412 5\' 7"  (1.702 m)     Head Cir --      Peak Flow --      Pain Score 01/25/15 1413 8     Pain Loc --      Pain Edu? --      Excl. in [place]? --      Constitutional:   Alert and oriented. Well appearing and in no distress. Eyes:   No scleral icterus. No conjunctival pallor. PERRL. EOMI ENT   Head:   Normocephalic and atraumatic.   Nose:   No congestion/rhinnorhea. No septal hematoma   Mouth/Throat:   MMM, no pharyngeal erythema. No peritonsillar mass. No uvula shift.   Neck:   No stridor. No SubQ emphysema. No meningismus. Hematological/Lymphatic/Immunilogical:   No cervical lymphadenopathy. Cardiovascular:   RRR. Normal and symmetric  distal pulses are present in all extremities. No murmurs, rubs, or gallops. Respiratory:   Normal respiratory effort without tachypnea nor retractions. Breath sounds are clear and  equal bilaterally. No wheezes/rales/rhonchi. Gastrointestinal:   Soft and nontender. No distention. There is no CVA tenderness.  No rebound, rigidity, or guarding. Genitourinary:   deferred Musculoskeletal:   No midline spinal tenderness. There is pain reproduced with rotation of the trunk, particularly with rotation toward the right. Straight leg raise causes only a tightness in the posterior right thigh on the right side, but is positive with reproduced left lower back pain on the left at about 15-20. Neurologic:   Normal speech and language.  CN 2-10 normal. Motor grossly intact. No pronator drift.  Normal gait. Normal dorsiflexion and plantarflexion. Normal patellar reflexes, symmetric. No gross focal neurologic deficits are appreciated.  Skin:    Skin is warm, dry and intact. No rash noted.  No petechiae, purpura, or bullae. Psychiatric:   Mood and affect are normal. Speech and behavior are normal. Patient exhibits appropriate insight and judgment.  ____________________________________________    LABS (pertinent positives/negatives) (all labs ordered are listed, but only abnormal results are displayed) Labs Reviewed  COMPREHENSIVE METABOLIC PANEL - Abnormal; Notable for the following:    Glucose, Bld 115 (*)    BUN 23 (*)    Creatinine, Ser 1.12 (*)    GFR calc non Af Amer 54 (*)    All other components within normal limits  URINALYSIS COMPLETEWITH MICROSCOPIC ([facility] ONLY) - Abnormal; Notable for the following:    Color, Urine YELLOW (*)    APPearance CLEAR (*)    Glucose, UA >500 (*)    Leukocytes, UA 1+ (*)    Bacteria, UA RARE (*)    Squamous Epithelial / LPF 6-30 (*)    All other components within normal limits  LIPASE, BLOOD  CBC    ____________________________________________   EKG  Interpreted by me Normal sinus rhythm rate of 84, normal axis and intervals. Poor R-wave progression in anterior precordial leads. Normal ST segments and T waves  ____________________________________________    RADIOLOGY    ____________________________________________   PROCEDURES   ____________________________________________   INITIAL IMPRESSION / ASSESSMENT AND PLAN / ED COURSE  Pertinent labs & imaging results that were available during my care of the patient were reviewed by me and considered in my medical decision making (see chart for details).  Patient presents with chronic back pain and sciatica. Also has hematuria which if she has urinary tract infection and acute stress response may be exacerbating her chronic pain. We'll give her some Toradol for her back pain while getting urinalysis. Labs otherwise unrevealing. Patient ambulatory to the bathroom with steady gait. Low suspicion for spinal epidural abscess or hematoma or cauda equina or other neurologic injury. No evidence of sepsis stroke. Patient has a follow-up appointment with Dr. Janae Bridgeman in 3 days so encouraged her to continue taking the [place] and to keep that appointment. She's also been encouraged to continue taking all of her medications to manage her diabetes.  ----------------------------------------- 5:19 PM on 01/25/2015 -----------------------------------------  Labs unremarkable. Patient calm, pain controlled, stable. We'll discharge and have her follow up with primary care.   ____________________________________________   FINAL CLINICAL IMPRESSION(S) / ED DIAGNOSES  Final diagnoses:  Chronic back pain  Sciatica associated with disorder of lumbar spine, left      Carrie Mew, MD 01/25/15 [place], MD 01/25/15 [phone]

## 2015-01-25 NOTE — Discharge Instructions (Signed)
You were prescribed a medication that is potentially sedating. Do not drink alcohol, drive or participate in any other potentially dangerous activities while taking this medication as it may make you sleepy. Do not take this medication with any other sedating medications, either prescription or over-the-counter. If you were prescribed Percocet or Vicodin, do not take these with acetaminophen (Tylenol) as it is already contained within these medications.   Opioid pain medications (or "narcotics") can be habit forming.  Use it as little as possible to achieve adequate pain control.  Do not use or use it with extreme caution if you have a history of opiate abuse or dependence.  If you are on a pain contract with your primary care doctor or a pain specialist, be sure to let them know you were prescribed this medication today from the [facility] Emergency Department.  This medication is intended for your use only - do not give any to anyone else and keep it in a secure place where nobody else, especially children and pets, have access to it.  It will also cause or worsen constipation, so you may want to consider taking an over-the-counter stool softener while you are taking this medication.  Chronic Back Pain  When back pain lasts longer than 3 months, it is called chronic back pain.People with chronic back pain often go through certain periods that are more intense (flare-ups).  CAUSES Chronic back pain can be caused by wear and tear (degeneration) on different structures in your back. These structures include:  The bones of your spine (vertebrae) and the joints surrounding your spinal cord and nerve roots (facets).  The strong, fibrous tissues that connect your vertebrae (ligaments). Degeneration of these structures may result in pressure on your nerves. This can lead to constant pain. HOME CARE INSTRUCTIONS  Avoid bending, heavy lifting, prolonged sitting, and activities which make the problem  worse.  Take brief periods of rest throughout the day to reduce your pain. Lying down or standing usually is better than sitting while you are resting.  Take over-the-counter or prescription medicines only as directed by your caregiver. SEEK IMMEDIATE MEDICAL CARE IF:   You have weakness or numbness in one of your legs or feet.  You have trouble controlling your bladder or bowels.  You have nausea, vomiting, abdominal pain, shortness of breath, or fainting.   This information is not intended to replace advice given to you by your health care provider. Make sure you discuss any questions you have with your health care provider.   Document Released: 04/28/2004 Document Revised: 06/13/2011 Document Reviewed: 09/08/2014 Elsevier Interactive Patient Education 2016 Elsevier Inc.  Sciatica Sciatica is pain, weakness, numbness, or tingling along the path of the sciatic nerve. The nerve starts in the lower back and runs down the back of each leg. The nerve controls the muscles in the lower leg and in the back of the knee, while also providing sensation to the back of the thigh, lower leg, and the sole of your foot. Sciatica is a symptom of another medical condition. For instance, nerve damage or certain conditions, such as a herniated disk or bone spur on the spine, pinch or put pressure on the sciatic nerve. This causes the pain, weakness, or other sensations normally associated with sciatica. Generally, sciatica only affects one side of the body. CAUSES   Herniated or slipped disc.  Degenerative disk disease.  A pain disorder involving the narrow muscle in the buttocks (piriformis syndrome).  Pelvic injury or fracture.  Pregnancy.  Tumor (rare). SYMPTOMS  Symptoms can vary from mild to very severe. The symptoms usually travel from the low back to the buttocks and down the back of the leg. Symptoms can include:  Mild tingling or dull aches in the lower back, leg, or hip.  Numbness in  the back of the calf or sole of the foot.  Burning sensations in the lower back, leg, or hip.  Sharp pains in the lower back, leg, or hip.  Leg weakness.  Severe back pain inhibiting movement. These symptoms may get worse with coughing, sneezing, laughing, or prolonged sitting or standing. Also, being overweight may worsen symptoms. DIAGNOSIS  Your caregiver will perform a physical exam to look for common symptoms of sciatica. He or she may ask you to do certain movements or activities that would trigger sciatic nerve pain. Other tests may be performed to find the cause of the sciatica. These may include:  Blood tests.  X-rays.  Imaging tests, such as an MRI or CT scan. TREATMENT  Treatment is directed at the cause of the sciatic pain. Sometimes, treatment is not necessary and the pain and discomfort goes away on its own. If treatment is needed, your caregiver may suggest:  Over-the-counter medicines to relieve pain.  Prescription medicines, such as anti-inflammatory medicine, muscle relaxants, or narcotics.  Applying heat or ice to the painful area.  Steroid injections to lessen pain, irritation, and inflammation around the nerve.  Reducing activity during periods of pain.  Exercising and stretching to strengthen your abdomen and improve flexibility of your spine. Your caregiver may suggest losing weight if the extra weight makes the back pain worse.  Physical therapy.  Surgery to eliminate what is pressing or pinching the nerve, such as a bone spur or part of a herniated disk. HOME CARE INSTRUCTIONS   Only take over-the-counter or prescription medicines for pain or discomfort as directed by your caregiver.  Apply ice to the affected area for 20 minutes, 3-4 times a day for the first 48-72 hours. Then try heat in the same way.  Exercise, stretch, or perform your usual activities if these do not aggravate your pain.  Attend physical therapy sessions as directed by your  caregiver.  Keep all follow-up appointments as directed by your caregiver.  Do not wear high heels or shoes that do not provide proper support.  Check your mattress to see if it is too soft. A firm mattress may lessen your pain and discomfort. SEEK IMMEDIATE MEDICAL CARE IF:   You lose control of your bowel or bladder (incontinence).  You have increasing weakness in the lower back, pelvis, buttocks, or legs.  You have redness or swelling of your back.  You have a burning sensation when you urinate.  You have pain that gets worse when you lie down or awakens you at night.  Your pain is worse than you have experienced in the past.  Your pain is lasting longer than 4 weeks.  You are suddenly losing weight without reason. MAKE SURE YOU:  Understand these instructions.  Will watch your condition.  Will get help right away if you are not doing well or get worse.   This information is not intended to replace advice given to you by your health care provider. Make sure you discuss any questions you have with your health care provider.   Document Released: 03/15/2001 Document Revised: 12/10/2014 Document Reviewed: 07/31/2011 Elsevier Interactive Patient Education [employer].

## 2015-01-25 NOTE — ED Notes (Signed)
Per pt she has not been able to bear weight on her legs d/t pain and weakness. Pt's SO states she had back surgery approx 5 years ago and was dx with an infection in her back 5 years ago. Per SO pt is taking 3,000mg  of abx/day for the infection in her back. Both pt and SO states that she has not been able to keep anything down. SO states that pt's legs are swelling beginning to turn black. Pt states last episode of emesis was "not too long ago".

## 2015-01-28 ENCOUNTER — Ambulatory Visit (INDEPENDENT_AMBULATORY_CARE_PROVIDER_SITE_OTHER): Payer: Medicare Other | Admitting: Family Medicine

## 2015-01-28 ENCOUNTER — Encounter: Payer: Self-pay | Admitting: Family Medicine

## 2015-01-28 VITALS — BP 96/63 | HR 82 | Temp 98.4°F | Ht 66.3 in | Wt 201.0 lb

## 2015-01-28 DIAGNOSIS — F329 Major depressive disorder, single episode, unspecified: Secondary | ICD-10-CM | POA: Diagnosis not present

## 2015-01-28 DIAGNOSIS — E119 Type 2 diabetes mellitus without complications: Secondary | ICD-10-CM

## 2015-01-28 DIAGNOSIS — M4326 Fusion of spine, lumbar region: Secondary | ICD-10-CM | POA: Diagnosis not present

## 2015-01-28 DIAGNOSIS — F32A Depression, unspecified: Secondary | ICD-10-CM

## 2015-01-28 MED ORDER — TRAMADOL HCL 50 MG PO TABS: 50.0000 mg | ORAL_TABLET | Freq: Three times a day (TID) | ORAL | Status: DC | PRN

## 2015-01-28 MED ORDER — PREDNISONE 10 MG PO TABS: ORAL_TABLET | ORAL | Status: DC

## 2015-01-28 NOTE — Assessment & Plan Note (Signed)
Patient with chronic back pain with acute on chronic over this last month worsening pain with sciatica and radicular symptoms into her left leg. Reviewed the emergency nature of cauda equina syndrome and to go to the emergency room Reviewed prednisone risk-benefit and elevated blood sugar especially in a known diabetic. Reviewed because of hardware and back imaging would be limited will refer to neurosurgery to further evaluate And determine any appropriate imaging and further treatment.

## 2015-01-28 NOTE — Progress Notes (Signed)
BP 96/63 mmHg  Pulse 82  Temp(Src) 98.4 F (36.9 C)  Ht 5' 6.3" (1.684 m)  Wt 201 lb (91.173 kg)  BMI 32.15 kg/m2  SpO2 99%   Subjective:    Patient ID: Stacey Gross, female    DOB: Sep 28, 1960, 54 y.o.   MRN: [id number]  HPI: Stacey Gross is a 54 y.o. female  Chief Complaint  Patient presents with  . Sciatica    left leg  . bloodwork    [facility] ED, 01/26/15   Patient follow-up from emergency room for sciatica pain medicine Toradol injection did not help patient with continued marked left leg pain discomfort weakness difficulty walking no bowel or bladder symptoms no swelling or wetting pants inadvertently. No blood in stool or urine. And pain is gotten worse. Radicular pain is constant down her left buttocks area into her left knee. And having left leg numbness and weakness. Relevant past medical, surgical, family and social history reviewed and updated as indicated. Interim medical history since our last visit reviewed. Allergies and medications reviewed and updated.  Review of Systems  Constitutional: Positive for activity change and unexpected weight change. Negative for fever, chills and fatigue.       Weight loss as patient not able to eat due to pain  Respiratory: Negative.   Cardiovascular: Negative.     Per HPI unless specifically indicated above     Objective:    BP 96/63 mmHg  Pulse 82  Temp(Src) 98.4 F (36.9 C)  Ht 5' 6.3" (1.684 m)  Wt 201 lb (91.173 kg)  BMI 32.15 kg/m2  SpO2 99%  Wt Readings from Last 3 Encounters:  01/28/15 201 lb (91.173 kg)  01/25/15 186 lb (84.369 kg)  12/30/14 204 lb (92.534 kg)    Physical Exam  Constitutional: She is oriented to person, place, and time. She appears well-developed and well-nourished. No distress.  HENT:  Head: Normocephalic and atraumatic.  Right Ear: Hearing normal.  Left Ear: Hearing normal.  Nose: Nose normal.  Eyes: Conjunctivae and lids are normal. Right eye exhibits no discharge. Left eye  exhibits no discharge. No scleral icterus.  Pulmonary/Chest: Effort normal. No respiratory distress.  Musculoskeletal: Normal range of motion.  Neurological: She is alert and oriented to person, place, and time.  Patient refuses left leg raising because it'll hurt too bad has decreased coordination of left leg unable to track heel to shin on the left or have enough strength to stand on her left foot. Unable to do toe raises on the left  Skin: Skin is intact. No rash noted.  Psychiatric: She has a normal mood and affect. Her speech is normal and behavior is normal. Judgment and thought content normal. Cognition and memory are normal.    Results for orders placed or performed during the hospital encounter of 01/25/15  Lipase, blood  Result Value Ref Range   Lipase 27 11 - 51 U/L  Comprehensive metabolic panel  Result Value Ref Range   Sodium 136 135 - 145 mmol/L   Potassium 4.5 3.5 - 5.1 mmol/L   Chloride 104 101 - 111 mmol/L   CO2 24 22 - 32 mmol/L   Glucose, Bld 115 (H) 65 - 99 mg/dL   BUN 23 (H) 6 - 20 mg/dL   Creatinine, Ser 1.12 (H) 0.44 - 1.00 mg/dL   Calcium 9.5 8.9 - 10.3 mg/dL   Total Protein 7.4 6.5 - 8.1 g/dL   Albumin 4.6 3.5 - 5.0 g/dL   AST  21 15 - 41 U/L   ALT 18 14 - 54 U/L   Alkaline Phosphatase 77 38 - 126 U/L   Total Bilirubin 0.8 0.3 - 1.2 mg/dL   GFR calc non Af Amer 54 (L) >60 mL/min   GFR calc Af Amer >60 >60 mL/min   Anion gap 8 5 - 15  CBC  Result Value Ref Range   WBC 9.6 3.6 - 11.0 K/uL   RBC 4.62 3.80 - 5.20 MIL/uL   Hemoglobin 14.6 12.0 - 16.0 g/dL   HCT 42.5 35.0 - 47.0 %   MCV 91.8 80.0 - 100.0 fL   MCH 31.5 26.0 - 34.0 pg   MCHC 34.3 32.0 - 36.0 g/dL   RDW 13.3 11.5 - 14.5 %   Platelets 303 150 - 440 K/uL  Urinalysis complete, with microscopic ([facility] only)  Result Value Ref Range   Color, Urine YELLOW (A) YELLOW   APPearance CLEAR (A) CLEAR   Glucose, UA >500 (A) NEGATIVE mg/dL   Bilirubin Urine NEGATIVE NEGATIVE   Ketones, ur NEGATIVE  NEGATIVE mg/dL   Specific Gravity, Urine 1.006 1.005 - 1.030   Hgb urine dipstick NEGATIVE NEGATIVE   pH 6.0 5.0 - 8.0   Protein, ur NEGATIVE NEGATIVE mg/dL   Nitrite NEGATIVE NEGATIVE   Leukocytes, UA 1+ (A) NEGATIVE   RBC / HPF 0-5 0 - 5 RBC/hpf   WBC, UA 6-30 0 - 5 WBC/hpf   Bacteria, UA RARE (A) NONE SEEN   Squamous Epithelial / LPF 6-30 (A) NONE SEEN   Mucous PRESENT       Assessment & Plan:   Problem List Items Addressed This Visit      Endocrine   Diabetes ([place]) - Primary    Reviewed diabetes blood sugars been doing well especially with weight loss patient should be on to tolerate prednisone        Musculoskeletal and Integument   Fusion of spine of lumbar region    Patient with chronic back pain with acute on chronic over this last month worsening pain with sciatica and radicular symptoms into her left leg. Reviewed the emergency nature of cauda equina syndrome and to go to the emergency room Reviewed prednisone risk-benefit and elevated blood sugar especially in a known diabetic. Reviewed because of hardware and back imaging would be limited will refer to neurosurgery to further evaluate And determine any appropriate imaging and further treatment.      Relevant Orders   Ambulatory referral to Neurosurgery     Other   Depression    Discuss potential for interaction between duloxetine and tramadol patient will hold duloxetine while taking tramadol.          Follow up plan: Return in about 4 weeks (around 02/25/2015), or if symptoms worsen or fail to improve, for Follow-up .

## 2015-01-28 NOTE — Assessment & Plan Note (Signed)
Discuss potential for interaction between duloxetine and tramadol patient will hold duloxetine while taking tramadol.

## 2015-01-28 NOTE — Assessment & Plan Note (Signed)
Reviewed diabetes blood sugars been doing well especially with weight loss patient should be on to tolerate prednisone

## 2015-02-02 DIAGNOSIS — M25551 Pain in right hip: Secondary | ICD-10-CM | POA: Diagnosis not present

## 2015-02-02 DIAGNOSIS — T847XXS Infection and inflammatory reaction due to other internal orthopedic prosthetic devices, implants and grafts, sequela: Secondary | ICD-10-CM | POA: Diagnosis not present

## 2015-02-02 DIAGNOSIS — M4727 Other spondylosis with radiculopathy, lumbosacral region: Secondary | ICD-10-CM | POA: Diagnosis not present

## 2015-02-02 DIAGNOSIS — M25552 Pain in left hip: Secondary | ICD-10-CM | POA: Diagnosis not present

## 2015-02-03 ENCOUNTER — Telehealth: Payer: Self-pay

## 2015-02-03 MED ORDER — ONETOUCH ULTRA SYSTEM W/DEVICE KIT: 1.0000 | PACK | Freq: Once | Status: DC

## 2015-02-03 NOTE — Telephone Encounter (Signed)
Fax from [employer]  Requesting  One Touch Ultra Test Strips #100 Checks once daily

## 2015-02-05 ENCOUNTER — Other Ambulatory Visit (HOSPITAL_COMMUNITY): Payer: Self-pay | Admitting: Neurosurgery

## 2015-02-05 ENCOUNTER — Other Ambulatory Visit: Payer: Self-pay | Admitting: Neurosurgery

## 2015-02-05 DIAGNOSIS — T847XXS Infection and inflammatory reaction due to other internal orthopedic prosthetic devices, implants and grafts, sequela: Secondary | ICD-10-CM

## 2015-02-09 ENCOUNTER — Telehealth: Payer: Self-pay

## 2015-02-09 MED ORDER — GLUCOSE BLOOD VI STRP: ORAL_STRIP | Status: DC

## 2015-02-09 NOTE — Telephone Encounter (Signed)
[facility]  Stating 4th request  One Touch Ultra Test Strips #100

## 2015-02-13 ENCOUNTER — Ambulatory Visit (HOSPITAL_COMMUNITY)
Admission: RE | Admit: 2015-02-13 | Discharge: 2015-02-13 | Disposition: A | Discharge status: Home / Self Care | Payer: Medicare Other | Source: Ambulatory Visit | Attending: Neurosurgery | Admitting: Neurosurgery

## 2015-02-13 DIAGNOSIS — M5416 Radiculopathy, lumbar region: Secondary | ICD-10-CM | POA: Diagnosis not present

## 2015-02-13 DIAGNOSIS — T847XXA Infection and inflammatory reaction due to other internal orthopedic prosthetic devices, implants and grafts, initial encounter: Secondary | ICD-10-CM | POA: Diagnosis present

## 2015-02-13 DIAGNOSIS — M4806 Spinal stenosis, lumbar region: Secondary | ICD-10-CM | POA: Diagnosis not present

## 2015-02-13 DIAGNOSIS — T847XXS Infection and inflammatory reaction due to other internal orthopedic prosthetic devices, implants and grafts, sequela: Secondary | ICD-10-CM | POA: Insufficient documentation

## 2015-02-13 DIAGNOSIS — M5136 Other intervertebral disc degeneration, lumbar region: Secondary | ICD-10-CM | POA: Insufficient documentation

## 2015-02-13 DIAGNOSIS — Y838 Other surgical procedures as the cause of abnormal reaction of the patient, or of later complication, without mention of misadventure at the time of the procedure: Secondary | ICD-10-CM | POA: Diagnosis not present

## 2015-02-13 DIAGNOSIS — Z981 Arthrodesis status: Secondary | ICD-10-CM | POA: Diagnosis not present

## 2015-02-13 LAB — GLUCOSE, CAPILLARY: GLUCOSE-CAPILLARY: 113 mg/dL — AB (ref 65–99)

## 2015-02-13 MED ORDER — OXYCODONE HCL 5 MG PO TABS
ORAL_TABLET | ORAL | Status: AC
  Filled 2015-02-13: qty 1

## 2015-02-13 MED ORDER — DIAZEPAM 5 MG PO TABS
10.0000 mg | ORAL_TABLET | Freq: Once | ORAL | Status: AC
  Administered 2015-02-13: 10 mg via ORAL

## 2015-02-13 MED ORDER — ONDANSETRON HCL 4 MG/2ML IJ SOLN: 4.0000 mg | Freq: Four times a day (QID) | INTRAMUSCULAR | Status: DC | PRN

## 2015-02-13 MED ORDER — OXYCODONE HCL 5 MG PO TABS
5.0000 mg | ORAL_TABLET | ORAL | Status: DC | PRN
  Administered 2015-02-13 (×2): 5 mg via ORAL

## 2015-02-13 MED ORDER — LIDOCAINE HCL (PF) 1 % IJ SOLN
INTRAMUSCULAR | Status: AC
  Filled 2015-02-13: qty 5

## 2015-02-13 MED ORDER — DIAZEPAM 5 MG PO TABS
ORAL_TABLET | ORAL | Status: AC
  Administered 2015-02-13: 10 mg via ORAL
  Filled 2015-02-13: qty 2

## 2015-02-13 MED ORDER — OXYCODONE HCL 5 MG PO TABS
ORAL_TABLET | ORAL | Status: DC
Start: 2015-02-13 — End: 2015-02-14
  Filled 2015-02-13: qty 1

## 2015-02-13 MED ORDER — IOHEXOL 180 MG/ML  SOLN
20.0000 mL | Freq: Once | INTRAMUSCULAR | Status: DC | PRN
  Administered 2015-02-13: 20 mL via INTRATHECAL
  Filled 2015-02-13: qty 20

## 2015-02-13 NOTE — Op Note (Signed)
02/13/2015 [facility]el[facility]  PATIENT:  Stacey Gross is a 54 y.o. female with pain in the lower back and lower extremities.  PRE-OPERATIVE DIAGNOSIS:  Osteoarthritis with radiculopathy, spinal stenosis  POST-OPERATIVE DIAGNOSIS:  Osteoarthritis with radiculopathy, spinal stenosis   PROCEDURE:  Lumbar Myelogram   SURGEON:  Stephane Niemann  ANESTHESIA:   local LOCAL MEDICATIONS USED:  LIDOCAINE  and Amount: 7 ml Procedure Note: HASTI VAUGHNS is a 54 y.o. female Was taken to the fluoroscopy suite and  positioned prone on the fluoroscopy table. Her back was prepared and draped in a sterile manner. I infiltrated 7 cc into the lumbar region. I then introduced a spinal needle into the thecal sac at the L2/3 interlaminar space. I infiltrated 20cc of Omnipaque 180 into the thecal sac. Fluoroscopy showed the needle and contrast in the thecal sac. [name] tolerated the procedure well. she Will be taken to CT for evaluation.     PATIENT DISPOSITION:  PACU - hemodynamically stable.

## 2015-02-13 NOTE — Discharge Instructions (Signed)
Myelography, Care After °These instructions give you information on caring for yourself after your procedure. Your doctor may also give you more specific instructions. Call your doctor if you have any problems or questions after your procedure. °HOME CARE °· Rest the first day. °· When you rest, lie flat, with your head slightly raised (elevated). °· Avoid heavy lifting and activity for 48 hours, or as told by your doctor. °· You may take the bandage (dressing) off one day after the test, or as told by your doctor. °· Take all medicines only as told by your doctor. °· Ask your doctor when it is okay to take a shower or bath. °· Ask your doctor when your test results will be ready and how you can get them. Make sure you follow up and get your results. °· Do not drink alcohol for 24 hours, or as told by your doctor. °· Drink enough fluid to keep your pee (urine) clear or pale yellow. °GET HELP IF:  °· You have a fever. °· You have a headache. °· You feel sick to your stomach (nauseous) or throw up (vomit). °· You have pain or cramping in your belly (abdomen). °GET HELP RIGHT AWAY IF:  °· You have a headache with a stiff neck or fever. °· You have trouble breathing. °· Any of the places where the needles were put in are: °¨ Puffy (swollen) or red. °¨ Sore or hot to the touch. °¨ Draining yellowish-white fluid (pus). °¨ Bleeding. °MAKE SURE YOU: °· Understand these instructions. °· Will watch your condition. °· Will get help right away if you are not doing well or get worse. °  °This information is not intended to replace advice given to you by your health care provider. Make sure you discuss any questions you have with your health care provider. °  °Document Released: 12/29/2007 Document Revised: 04/11/2014 Document Reviewed: 12/14/2011 °Elsevier Interactive Patient Education ©2016 Elsevier Inc. ° °

## 2015-02-18 ENCOUNTER — Telehealth: Payer: Self-pay | Admitting: Family Medicine

## 2015-02-18 NOTE — Telephone Encounter (Signed)
Pt would like to know if she could have something called [facility]

## 2015-02-25 ENCOUNTER — Encounter: Payer: Self-pay | Admitting: Family Medicine

## 2015-02-25 ENCOUNTER — Ambulatory Visit (INDEPENDENT_AMBULATORY_CARE_PROVIDER_SITE_OTHER): Payer: Medicare Other | Admitting: Family Medicine

## 2015-02-25 VITALS — BP 119/74 | HR 73 | Temp 97.9°F | Ht 66.3 in | Wt 201.0 lb

## 2015-02-25 DIAGNOSIS — M48061 Spinal stenosis, lumbar region without neurogenic claudication: Secondary | ICD-10-CM | POA: Insufficient documentation

## 2015-02-25 DIAGNOSIS — M4806 Spinal stenosis, lumbar region: Secondary | ICD-10-CM

## 2015-02-25 DIAGNOSIS — Z720 Tobacco use: Secondary | ICD-10-CM | POA: Diagnosis not present

## 2015-02-25 DIAGNOSIS — J019 Acute sinusitis, unspecified: Secondary | ICD-10-CM | POA: Diagnosis not present

## 2015-02-25 MED ORDER — AMOXICILLIN-POT CLAVULANATE 875-125 MG PO TABS
1.0000 | ORAL_TABLET | Freq: Two times a day (BID) | ORAL | Status: DC
Start: 2015-02-25 — End: 2015-03-26

## 2015-02-25 NOTE — Progress Notes (Addendum)
   BP 119/74 mmHg  Pulse 73  Temp(Src) 97.9 F (36.6 C)  Ht 5' 6.3" (1.684 m)  Wt 201 lb (91.173 kg)  BMI 32.15 kg/m2  SpO2 99%   Subjective:    Patient ID: Stacey Gross, female    DOB: April 12, 1960, 54 y.o.   MRN: [id number]  HPI: Stacey Gross is a 54 y.o. female  No chief complaint on file.  patient still with chest head cold and a lot of drainage congestion and coughing Tramadol did not help at all for pain has continued Cymbalta for nerves. Working with neurosurgeon and CT of back showing marked spinal stenosis patient hasn't heard back from neurosurgery at patient will contact later today. Cold still feeling bad with a lot of drainage no fever  Relevant past medical, surgical, family and social history reviewed and updated as indicated. Interim medical history since our last visit reviewed. Allergies and medications reviewed and updated.  Review of Systems  Constitutional: Positive for fatigue. Negative for chills and diaphoresis.  Respiratory: Negative.   Cardiovascular: Negative.     Per HPI unless specifically indicated above     Objective:    BP 119/74 mmHg  Pulse 73  Temp(Src) 97.9 F (36.6 C)  Ht 5' 6.3" (1.684 m)  Wt 201 lb (91.173 kg)  BMI 32.15 kg/m2  SpO2 99%  Wt Readings from Last 3 Encounters:  02/25/15 201 lb (91.173 kg)  02/13/15 196 lb (88.905 kg)  01/28/15 201 lb (91.173 kg)    Physical Exam  Constitutional: She is oriented to person, place, and time. She appears well-developed and well-nourished. No distress.  HENT:  Head: Normocephalic and atraumatic.  Right Ear: Hearing normal.  Left Ear: Hearing normal.  Nose: Nose normal.  Mouth/Throat: Oropharyngeal exudate present.  Left ear inflamed  Eyes: Conjunctivae and lids are normal. Right eye exhibits no discharge. Left eye exhibits no discharge. No scleral icterus.  Cardiovascular: Normal rate, regular rhythm and normal heart sounds.   Pulmonary/Chest: Effort normal. No respiratory  distress.  Rhonchi  Musculoskeletal: Normal range of motion.  Lymphadenopathy:    She has no cervical adenopathy.  Neurological: She is alert and oriented to person, place, and time.  Skin: Skin is intact. No rash noted.  Psychiatric: She has a normal mood and affect. Her speech is normal and behavior is normal. Judgment and thought content normal. Cognition and memory are normal.    Results for orders placed or performed during the hospital encounter of 02/13/15  Glucose, capillary  Result Value Ref Range   Glucose-Capillary 113 (H) 65 - 99 mg/dL   Comment 1 Notify RN       Assessment & Plan:   Problem List Items Addressed This Visit      Respiratory   Sinusitis, acute - Primary    Discussed sinusitis care and treatment with use of over-the-counter Tylenol sinus Mucinex will start antibiotics      Relevant Medications   amoxicillin-clavulanate (AUGMENTIN) 875-125 MG tablet     Other   Tobacco abuse    Encourage smoking cessation again      Spinal stenosis at L4-L5 level    Patient will follow-up with neurosurgery for further consideration of repeat surgery.          Follow up plan: Return in about 4 weeks (around 03/25/2015) for Diabetes check 1 month with A1c.

## 2015-02-25 NOTE — Assessment & Plan Note (Signed)
Encourage smoking cessation again

## 2015-02-25 NOTE — Assessment & Plan Note (Signed)
Patient will follow-up with neurosurgery for further consideration of repeat surgery.

## 2015-02-25 NOTE — Assessment & Plan Note (Signed)
Discussed sinusitis care and treatment with use of over-the-counter Tylenol sinus Mucinex will start antibiotics

## 2015-03-02 DIAGNOSIS — M4806 Spinal stenosis, lumbar region: Secondary | ICD-10-CM | POA: Diagnosis not present

## 2015-03-02 DIAGNOSIS — Z6831 Body mass index (BMI) 31.0-31.9, adult: Secondary | ICD-10-CM | POA: Diagnosis not present

## 2015-03-02 DIAGNOSIS — T847XXS Infection and inflammatory reaction due to other internal orthopedic prosthetic devices, implants and grafts, sequela: Secondary | ICD-10-CM | POA: Diagnosis not present

## 2015-03-02 DIAGNOSIS — M5416 Radiculopathy, lumbar region: Secondary | ICD-10-CM | POA: Diagnosis not present

## 2015-03-02 DIAGNOSIS — M4727 Other spondylosis with radiculopathy, lumbosacral region: Secondary | ICD-10-CM | POA: Diagnosis not present

## 2015-03-03 ENCOUNTER — Other Ambulatory Visit: Payer: Self-pay | Admitting: Neurosurgery

## 2015-03-05 ENCOUNTER — Encounter (HOSPITAL_COMMUNITY): Payer: Self-pay | Admitting: *Deleted

## 2015-03-06 ENCOUNTER — Inpatient Hospital Stay (HOSPITAL_COMMUNITY): Payer: Medicare Other | Admitting: Anesthesiology

## 2015-03-06 ENCOUNTER — Inpatient Hospital Stay (HOSPITAL_COMMUNITY): Payer: Medicare Other

## 2015-03-06 ENCOUNTER — Encounter (HOSPITAL_COMMUNITY): Payer: Self-pay | Admitting: General Practice

## 2015-03-06 ENCOUNTER — Inpatient Hospital Stay (HOSPITAL_COMMUNITY)
Admission: RE | Admit: 2015-03-06 | Discharge: 2015-03-08 | DRG: 459 | Disposition: A | Discharge status: Home / Self Care | Payer: Medicare Other | Source: Ambulatory Visit | Attending: Neurosurgery | Admitting: Neurosurgery

## 2015-03-06 ENCOUNTER — Encounter (HOSPITAL_COMMUNITY)
Admission: RE | Disposition: A | Discharge status: Home / Self Care | Payer: Self-pay | Source: Ambulatory Visit | Attending: Neurosurgery

## 2015-03-06 DIAGNOSIS — M4726 Other spondylosis with radiculopathy, lumbar region: Secondary | ICD-10-CM | POA: Diagnosis present

## 2015-03-06 DIAGNOSIS — Z9889 Other specified postprocedural states: Secondary | ICD-10-CM | POA: Diagnosis not present

## 2015-03-06 DIAGNOSIS — I10 Essential (primary) hypertension: Secondary | ICD-10-CM | POA: Diagnosis present

## 2015-03-06 DIAGNOSIS — Z91040 Latex allergy status: Secondary | ICD-10-CM | POA: Diagnosis not present

## 2015-03-06 DIAGNOSIS — M4306 Spondylolysis, lumbar region: Secondary | ICD-10-CM | POA: Diagnosis not present

## 2015-03-06 DIAGNOSIS — M4806 Spinal stenosis, lumbar region: Principal | ICD-10-CM | POA: Diagnosis present

## 2015-03-06 DIAGNOSIS — Z7984 Long term (current) use of oral hypoglycemic drugs: Secondary | ICD-10-CM

## 2015-03-06 DIAGNOSIS — G9519 Other vascular myelopathies: Secondary | ICD-10-CM | POA: Diagnosis not present

## 2015-03-06 DIAGNOSIS — Z881 Allergy status to other antibiotic agents status: Secondary | ICD-10-CM | POA: Diagnosis not present

## 2015-03-06 DIAGNOSIS — M47816 Spondylosis without myelopathy or radiculopathy, lumbar region: Secondary | ICD-10-CM | POA: Diagnosis not present

## 2015-03-06 DIAGNOSIS — E119 Type 2 diabetes mellitus without complications: Secondary | ICD-10-CM | POA: Diagnosis not present

## 2015-03-06 DIAGNOSIS — E7800 Pure hypercholesterolemia, unspecified: Secondary | ICD-10-CM | POA: Diagnosis not present

## 2015-03-06 DIAGNOSIS — M4326 Fusion of spine, lumbar region: Secondary | ICD-10-CM | POA: Diagnosis not present

## 2015-03-06 DIAGNOSIS — Z888 Allergy status to other drugs, medicaments and biological substances status: Secondary | ICD-10-CM

## 2015-03-06 DIAGNOSIS — Z981 Arthrodesis status: Secondary | ICD-10-CM

## 2015-03-06 DIAGNOSIS — F17200 Nicotine dependence, unspecified, uncomplicated: Secondary | ICD-10-CM | POA: Diagnosis not present

## 2015-03-06 DIAGNOSIS — M549 Dorsalgia, unspecified: Secondary | ICD-10-CM | POA: Diagnosis not present

## 2015-03-06 DIAGNOSIS — Z792 Long term (current) use of antibiotics: Secondary | ICD-10-CM

## 2015-03-06 DIAGNOSIS — K219 Gastro-esophageal reflux disease without esophagitis: Secondary | ICD-10-CM | POA: Diagnosis not present

## 2015-03-06 DIAGNOSIS — Z419 Encounter for procedure for purposes other than remedying health state, unspecified: Secondary | ICD-10-CM

## 2015-03-06 HISTORY — DX: Personal history of colonic polyps: Z86.010

## 2015-03-06 HISTORY — DX: Personal history of other diseases of the respiratory system: Z87.09

## 2015-03-06 HISTORY — DX: Weakness: R53.1

## 2015-03-06 HISTORY — DX: Gastro-esophageal reflux disease without esophagitis: K21.9

## 2015-03-06 HISTORY — DX: Personal history of colon polyps, unspecified: Z86.0100

## 2015-03-06 HISTORY — DX: Dorsalgia, unspecified: M54.9

## 2015-03-06 HISTORY — DX: Other chronic pain: G89.29

## 2015-03-06 HISTORY — DX: Personal history of other infectious and parasitic diseases: Z86.19

## 2015-03-06 LAB — CBC
HEMATOCRIT: 42 % (ref 36.0–46.0)
Hemoglobin: 13.8 g/dL (ref 12.0–15.0)
MCH: 31.6 pg (ref 26.0–34.0)
MCHC: 32.9 g/dL (ref 30.0–36.0)
MCV: 96.1 fL (ref 78.0–100.0)
Platelets: 270 10*3/uL (ref 150–400)
RBC: 4.37 MIL/uL (ref 3.87–5.11)
RDW: 13.2 % (ref 11.5–15.5)
WBC: 7.9 10*3/uL (ref 4.0–10.5)

## 2015-03-06 LAB — GLUCOSE, CAPILLARY
Glucose-Capillary: 116 mg/dL — ABNORMAL HIGH (ref 65–99)
Glucose-Capillary: 93 mg/dL (ref 65–99)

## 2015-03-06 LAB — BASIC METABOLIC PANEL
Anion gap: 8 (ref 5–15)
BUN: 11 mg/dL (ref 6–20)
CHLORIDE: 107 mmol/L (ref 101–111)
CO2: 26 mmol/L (ref 22–32)
Calcium: 9.5 mg/dL (ref 8.9–10.3)
Creatinine, Ser: 0.78 mg/dL (ref 0.44–1.00)
GFR calc Af Amer: >60 mL/min (ref >60)
GFR calc non Af Amer: >60 mL/min (ref >60)
GLUCOSE: 105 mg/dL — AB (ref 65–99)
POTASSIUM: 4.1 mmol/L (ref 3.5–5.1)
Sodium: 141 mmol/L (ref 135–145)

## 2015-03-06 LAB — SURGICAL PCR SCREEN
MRSA, PCR: NEGATIVE
Staphylococcus aureus: NEGATIVE

## 2015-03-06 LAB — ABO/RH: ABO/RH(D): O POS

## 2015-03-06 SURGERY — POSTERIOR LUMBAR FUSION 1 LEVEL
Anesthesia: General | Site: Back

## 2015-03-06 MED ORDER — ACETAMINOPHEN 325 MG PO TABS: 650.0000 mg | ORAL_TABLET | ORAL | Status: DC | PRN

## 2015-03-06 MED ORDER — PROPOFOL 10 MG/ML IV BOLUS
INTRAVENOUS | Status: DC | PRN
  Administered 2015-03-06: 150 mg via INTRAVENOUS

## 2015-03-06 MED ORDER — PHENYLEPHRINE 40 MCG/ML (10ML) SYRINGE FOR IV PUSH (FOR BLOOD PRESSURE SUPPORT)
PREFILLED_SYRINGE | INTRAVENOUS | Status: AC
Start: 2015-03-06 — End: 2015-03-06
  Filled 2015-03-06: qty 10

## 2015-03-06 MED ORDER — SULFAMETHOXAZOLE-TRIMETHOPRIM 400-80 MG/5ML IV SOLN
5.0000 mg/kg | INTRAVENOUS | Status: DC
  Filled 2015-03-06: qty 28.5

## 2015-03-06 MED ORDER — SULFAMETHOXAZOLE-TRIMETHOPRIM 800-160 MG PO TABS
1.0000 | ORAL_TABLET | Freq: Two times a day (BID) | ORAL | Status: DC
  Administered 2015-03-06 – 2015-03-08 (×4): 1 via ORAL
  Filled 2015-03-06 (×4): qty 1

## 2015-03-06 MED ORDER — PROPOFOL 10 MG/ML IV BOLUS
INTRAVENOUS | Status: AC
  Filled 2015-03-06: qty 20

## 2015-03-06 MED ORDER — GLYCOPYRROLATE 0.2 MG/ML IJ SOLN
INTRAMUSCULAR | Status: DC | PRN
  Administered 2015-03-06: 0.6 mg via INTRAVENOUS

## 2015-03-06 MED ORDER — NEOSTIGMINE METHYLSULFATE 10 MG/10ML IV SOLN
INTRAVENOUS | Status: AC
  Filled 2015-03-06: qty 1

## 2015-03-06 MED ORDER — BISACODYL 5 MG PO TBEC
5.0000 mg | DELAYED_RELEASE_TABLET | Freq: Every day | ORAL | Status: DC | PRN
  Administered 2015-03-07: 5 mg via ORAL
  Filled 2015-03-06: qty 1

## 2015-03-06 MED ORDER — HYDROMORPHONE HCL 1 MG/ML IJ SOLN
0.5000 mg | INTRAMUSCULAR | Status: DC | PRN
  Administered 2015-03-06 – 2015-03-07 (×4): 1 mg via INTRAVENOUS
  Filled 2015-03-06 (×4): qty 1

## 2015-03-06 MED ORDER — DULOXETINE HCL 60 MG PO CPEP
60.0000 mg | ORAL_CAPSULE | Freq: Every day | ORAL | Status: DC
  Administered 2015-03-07 – 2015-03-08 (×2): 60 mg via ORAL
  Filled 2015-03-06 (×2): qty 1

## 2015-03-06 MED ORDER — ROCURONIUM BROMIDE 50 MG/5ML IV SOLN
INTRAVENOUS | Status: AC
  Filled 2015-03-06: qty 1

## 2015-03-06 MED ORDER — SODIUM CHLORIDE 0.9 % IV SOLN: 250.0000 mL | INTRAVENOUS | Status: DC

## 2015-03-06 MED ORDER — FENTANYL CITRATE (PF) 100 MCG/2ML IJ SOLN
INTRAMUSCULAR | Status: AC
  Filled 2015-03-06: qty 2

## 2015-03-06 MED ORDER — DIAZEPAM 5 MG PO TABS
ORAL_TABLET | ORAL | Status: AC
  Filled 2015-03-06: qty 1

## 2015-03-06 MED ORDER — AMOXICILLIN-POT CLAVULANATE 875-125 MG PO TABS
1.0000 | ORAL_TABLET | Freq: Two times a day (BID) | ORAL | Status: DC
  Administered 2015-03-06 – 2015-03-08 (×4): 1 via ORAL
  Filled 2015-03-06 (×4): qty 1

## 2015-03-06 MED ORDER — ONDANSETRON HCL 4 MG/2ML IJ SOLN
INTRAMUSCULAR | Status: AC
  Filled 2015-03-06: qty 2

## 2015-03-06 MED ORDER — CEFAZOLIN SODIUM-DEXTROSE 2-3 GM-% IV SOLR
2.0000 g | Freq: Three times a day (TID) | INTRAVENOUS | Status: AC
  Administered 2015-03-06 – 2015-03-07 (×2): 2 g via INTRAVENOUS
  Filled 2015-03-06 (×2): qty 50

## 2015-03-06 MED ORDER — ALBUTEROL SULFATE HFA 108 (90 BASE) MCG/ACT IN AERS
INHALATION_SPRAY | RESPIRATORY_TRACT | Status: DC | PRN
  Administered 2015-03-06: 2 via RESPIRATORY_TRACT
  Administered 2015-03-06: 4 via RESPIRATORY_TRACT
  Administered 2015-03-06: 2 via RESPIRATORY_TRACT

## 2015-03-06 MED ORDER — FENTANYL CITRATE (PF) 250 MCG/5ML IJ SOLN
INTRAMUSCULAR | Status: AC
  Filled 2015-03-06: qty 5

## 2015-03-06 MED ORDER — LINAGLIPTIN 5 MG PO TABS
5.0000 mg | ORAL_TABLET | Freq: Every day | ORAL | Status: DC
  Administered 2015-03-08: 5 mg via ORAL
  Filled 2015-03-06 (×3): qty 1

## 2015-03-06 MED ORDER — GLYCOPYRROLATE 0.2 MG/ML IJ SOLN
INTRAMUSCULAR | Status: AC
  Filled 2015-03-06: qty 3

## 2015-03-06 MED ORDER — MAGNESIUM CITRATE PO SOLN: 1.0000 | Freq: Once | ORAL | Status: DC | PRN

## 2015-03-06 MED ORDER — POTASSIUM CHLORIDE IN NACL 20-0.9 MEQ/L-% IV SOLN
INTRAVENOUS | Status: DC
  Administered 2015-03-06: 20:00:00 via INTRAVENOUS
  Filled 2015-03-06 (×6): qty 1000

## 2015-03-06 MED ORDER — PHENOL 1.4 % MT LIQD: 1.0000 | OROMUCOSAL | Status: DC | PRN

## 2015-03-06 MED ORDER — PHENYLEPHRINE HCL 10 MG/ML IJ SOLN
INTRAMUSCULAR | Status: DC | PRN
  Administered 2015-03-06: 80 ug via INTRAVENOUS
  Administered 2015-03-06: 40 ug via INTRAVENOUS
  Administered 2015-03-06: 80 ug via INTRAVENOUS

## 2015-03-06 MED ORDER — FENTANYL CITRATE (PF) 100 MCG/2ML IJ SOLN
INTRAMUSCULAR | Status: DC | PRN
  Administered 2015-03-06 (×2): 50 ug via INTRAVENOUS
  Administered 2015-03-06: 100 ug via INTRAVENOUS
  Administered 2015-03-06: 25 ug via INTRAVENOUS
  Administered 2015-03-06: 100 ug via INTRAVENOUS

## 2015-03-06 MED ORDER — ROCURONIUM BROMIDE 100 MG/10ML IV SOLN
INTRAVENOUS | Status: DC | PRN
  Administered 2015-03-06: 50 mg via INTRAVENOUS
  Administered 2015-03-06: 10 mg via INTRAVENOUS

## 2015-03-06 MED ORDER — MENTHOL 3 MG MT LOZG: 1.0000 | LOZENGE | OROMUCOSAL | Status: DC | PRN

## 2015-03-06 MED ORDER — DOCUSATE SODIUM 100 MG PO CAPS
100.0000 mg | ORAL_CAPSULE | Freq: Two times a day (BID) | ORAL | Status: DC
  Administered 2015-03-06 – 2015-03-08 (×4): 100 mg via ORAL
  Filled 2015-03-06 (×4): qty 1

## 2015-03-06 MED ORDER — MIDAZOLAM HCL 5 MG/5ML IJ SOLN
INTRAMUSCULAR | Status: DC | PRN
  Administered 2015-03-06: 2 mg via INTRAVENOUS

## 2015-03-06 MED ORDER — PIOGLITAZONE HCL 30 MG PO TABS
30.0000 mg | ORAL_TABLET | Freq: Every day | ORAL | Status: DC
  Administered 2015-03-07 – 2015-03-08 (×2): 30 mg via ORAL
  Filled 2015-03-06 (×2): qty 1

## 2015-03-06 MED ORDER — DEXTROSE 5 % IV SOLN
INTRAVENOUS | Status: DC | PRN
  Administered 2015-03-06: 12:00:00 via INTRAVENOUS

## 2015-03-06 MED ORDER — ATORVASTATIN CALCIUM 10 MG PO TABS
20.0000 mg | ORAL_TABLET | Freq: Every day | ORAL | Status: DC
  Administered 2015-03-06 – 2015-03-07 (×2): 20 mg via ORAL
  Filled 2015-03-06 (×2): qty 2

## 2015-03-06 MED ORDER — CANAGLIFLOZIN 300 MG PO TABS
300.0000 mg | ORAL_TABLET | Freq: Every day | ORAL | Status: DC
  Administered 2015-03-07 – 2015-03-08 (×2): 300 mg via ORAL
  Filled 2015-03-06 (×3): qty 300

## 2015-03-06 MED ORDER — MUPIROCIN 2 % EX OINT
1.0000 "application " | TOPICAL_OINTMENT | Freq: Once | CUTANEOUS | Status: AC
  Administered 2015-03-06: 1 via TOPICAL
  Filled 2015-03-06: qty 22

## 2015-03-06 MED ORDER — ONDANSETRON HCL 4 MG/2ML IJ SOLN: 4.0000 mg | INTRAMUSCULAR | Status: DC | PRN

## 2015-03-06 MED ORDER — PROMETHAZINE HCL 25 MG/ML IJ SOLN: 6.2500 mg | INTRAMUSCULAR | Status: DC | PRN

## 2015-03-06 MED ORDER — BUPIVACAINE HCL (PF) 0.5 % IJ SOLN
INTRAMUSCULAR | Status: DC | PRN
  Administered 2015-03-06: 20 mL

## 2015-03-06 MED ORDER — SENNOSIDES-DOCUSATE SODIUM 8.6-50 MG PO TABS: 1.0000 | ORAL_TABLET | Freq: Every evening | ORAL | Status: DC | PRN

## 2015-03-06 MED ORDER — PANTOPRAZOLE SODIUM 40 MG PO TBEC
40.0000 mg | DELAYED_RELEASE_TABLET | Freq: Every day | ORAL | Status: DC
  Administered 2015-03-06 – 2015-03-08 (×3): 40 mg via ORAL
  Filled 2015-03-06 (×3): qty 1

## 2015-03-06 MED ORDER — LACTATED RINGERS IV SOLN
INTRAVENOUS | Status: DC | PRN
  Administered 2015-03-06 (×3): via INTRAVENOUS

## 2015-03-06 MED ORDER — LISINOPRIL 10 MG PO TABS
10.0000 mg | ORAL_TABLET | Freq: Every day | ORAL | Status: DC
  Administered 2015-03-06 – 2015-03-08 (×3): 10 mg via ORAL
  Filled 2015-03-06 (×3): qty 1

## 2015-03-06 MED ORDER — THROMBIN 20000 UNITS EX SOLR
CUTANEOUS | Status: DC | PRN
  Administered 2015-03-06: 20 mL via TOPICAL

## 2015-03-06 MED ORDER — CEFAZOLIN SODIUM-DEXTROSE 2-3 GM-% IV SOLR
INTRAVENOUS | Status: DC | PRN
  Administered 2015-03-06: 2 g via INTRAVENOUS

## 2015-03-06 MED ORDER — MIDAZOLAM HCL 2 MG/2ML IJ SOLN
INTRAMUSCULAR | Status: AC
  Filled 2015-03-06: qty 2

## 2015-03-06 MED ORDER — BACITRACIN ZINC 500 UNIT/GM EX OINT
TOPICAL_OINTMENT | CUTANEOUS | Status: DC | PRN
  Administered 2015-03-06: 1 via TOPICAL

## 2015-03-06 MED ORDER — SODIUM CHLORIDE 0.9 % IJ SOLN: 3.0000 mL | INTRAMUSCULAR | Status: DC | PRN

## 2015-03-06 MED ORDER — ZOLPIDEM TARTRATE 5 MG PO TABS
5.0000 mg | ORAL_TABLET | Freq: Every evening | ORAL | Status: DC | PRN
  Administered 2015-03-07 (×2): 5 mg via ORAL
  Filled 2015-03-06 (×2): qty 1

## 2015-03-06 MED ORDER — DEXTROSE 5 % IV SOLN
10.0000 mg | INTRAVENOUS | Status: DC | PRN
  Administered 2015-03-06: 15 ug/min via INTRAVENOUS

## 2015-03-06 MED ORDER — DIAZEPAM 5 MG PO TABS
5.0000 mg | ORAL_TABLET | Freq: Four times a day (QID) | ORAL | Status: DC | PRN
  Administered 2015-03-06 – 2015-03-07 (×2): 5 mg via ORAL
  Filled 2015-03-06 (×2): qty 1

## 2015-03-06 MED ORDER — METFORMIN HCL 500 MG PO TABS
1000.0000 mg | ORAL_TABLET | Freq: Two times a day (BID) | ORAL | Status: DC
Start: 2015-03-07 — End: 2015-03-08
  Administered 2015-03-07 – 2015-03-08 (×3): 1000 mg via ORAL
  Filled 2015-03-06 (×3): qty 2

## 2015-03-06 MED ORDER — PHENYLEPHRINE 40 MCG/ML (10ML) SYRINGE FOR IV PUSH (FOR BLOOD PRESSURE SUPPORT)
PREFILLED_SYRINGE | INTRAVENOUS | Status: AC
  Filled 2015-03-06: qty 10

## 2015-03-06 MED ORDER — 0.9 % SODIUM CHLORIDE (POUR BTL) OPTIME
TOPICAL | Status: DC | PRN
  Administered 2015-03-06 (×2): 1000 mL

## 2015-03-06 MED ORDER — NEOSTIGMINE METHYLSULFATE 10 MG/10ML IV SOLN
INTRAVENOUS | Status: DC | PRN
  Administered 2015-03-06: 4 mg via INTRAVENOUS

## 2015-03-06 MED ORDER — ONDANSETRON HCL 4 MG/2ML IJ SOLN
INTRAMUSCULAR | Status: DC | PRN
  Administered 2015-03-06: 4 mg via INTRAVENOUS

## 2015-03-06 MED ORDER — LACTATED RINGERS IV SOLN
INTRAVENOUS | Status: DC
  Administered 2015-03-06: 09:00:00 via INTRAVENOUS

## 2015-03-06 MED ORDER — SODIUM CHLORIDE 0.9 % IJ SOLN
3.0000 mL | Freq: Two times a day (BID) | INTRAMUSCULAR | Status: DC
  Administered 2015-03-06 – 2015-03-07 (×2): 3 mL via INTRAVENOUS

## 2015-03-06 MED ORDER — FENTANYL CITRATE (PF) 100 MCG/2ML IJ SOLN
25.0000 ug | INTRAMUSCULAR | Status: DC | PRN
  Administered 2015-03-06 (×4): 25 ug via INTRAVENOUS

## 2015-03-06 MED ORDER — ACETAMINOPHEN 650 MG RE SUPP: 650.0000 mg | RECTAL | Status: DC | PRN

## 2015-03-06 MED ORDER — CEFAZOLIN SODIUM-DEXTROSE 2-3 GM-% IV SOLR
INTRAVENOUS | Status: AC
  Filled 2015-03-06: qty 50

## 2015-03-06 MED ORDER — LIDOCAINE HCL (CARDIAC) 20 MG/ML IV SOLN
INTRAVENOUS | Status: DC | PRN
  Administered 2015-03-06: 100 mg via INTRAVENOUS

## 2015-03-06 SURGICAL SUPPLY — 62 items
BENZOIN TINCTURE PRP APPL 2/3 (GAUZE/BANDAGES/DRESSINGS) IMPLANT
BLADE CLIPPER SURG (BLADE) IMPLANT
BUR MATCHSTICK NEURO 3.0 LAGG (BURR) ×6 IMPLANT
BUR PRECISION FLUTE 5.0 (BURR) ×3 IMPLANT
CANISTER SUCT 3000ML PPV (MISCELLANEOUS) ×3 IMPLANT
CAP LCK SPNE (Orthopedic Implant) ×4 IMPLANT
CAP LOCK SPINE RADIUS (Orthopedic Implant) ×4 IMPLANT
CAP LOCKING (Orthopedic Implant) ×8 IMPLANT
CLOSURE WOUND 1/2 X4 (GAUZE/BANDAGES/DRESSINGS)
CONT SPEC 4OZ CLIKSEAL STRL BL (MISCELLANEOUS) ×3 IMPLANT
COVER BACK TABLE 60X90IN (DRAPES) ×3 IMPLANT
DECANTER SPIKE VIAL GLASS SM (MISCELLANEOUS) ×3 IMPLANT
DRAPE C-ARM 42X72 X-RAY (DRAPES) ×6 IMPLANT
DRAPE C-ARMOR (DRAPES) IMPLANT
DRAPE LAPAROTOMY 100X72X124 (DRAPES) ×3 IMPLANT
DRAPE POUCH INSTRU U-SHP 10X18 (DRAPES) ×3 IMPLANT
DRAPE SURG 17X23 STRL (DRAPES) ×3 IMPLANT
DRSG OPSITE POSTOP 4X8 (GAUZE/BANDAGES/DRESSINGS) ×3 IMPLANT
DURAPREP 26ML APPLICATOR (WOUND CARE) ×3 IMPLANT
ELECT REM PT RETURN 9FT ADLT (ELECTROSURGICAL) ×3
ELECTRODE REM PT RTRN 9FT ADLT (ELECTROSURGICAL) ×1 IMPLANT
GAUZE SPONGE 4X4 12PLY STRL (GAUZE/BANDAGES/DRESSINGS) IMPLANT
GAUZE SPONGE 4X4 16PLY XRAY LF (GAUZE/BANDAGES/DRESSINGS) ×3 IMPLANT
GLOVE EXAM NITRILE LRG STRL (GLOVE) IMPLANT
GLOVE EXAM NITRILE MD LF STRL (GLOVE) IMPLANT
GLOVE EXAM NITRILE XL STR (GLOVE) IMPLANT
GLOVE EXAM NITRILE XS STR PU (GLOVE) IMPLANT
GLOVE SS N UNI LF 6.5 STRL (GLOVE) ×6 IMPLANT
GOWN STRL REUS W/ TWL LRG LVL3 (GOWN DISPOSABLE) ×2 IMPLANT
GOWN STRL REUS W/ TWL XL LVL3 (GOWN DISPOSABLE) IMPLANT
GOWN STRL REUS W/TWL 2XL LVL3 (GOWN DISPOSABLE) IMPLANT
GOWN STRL REUS W/TWL LRG LVL3 (GOWN DISPOSABLE) ×4
GOWN STRL REUS W/TWL XL LVL3 (GOWN DISPOSABLE)
GRAFT BN 5X1XSPNE CVD POST DBM (Bone Implant) ×1 IMPLANT
GRAFT BONE MAGNIFUSE 1X5CM (Bone Implant) ×2 IMPLANT
KIT BASIN OR (CUSTOM PROCEDURE TRAY) ×3 IMPLANT
KIT POSITION SURG JACKSON T1 (MISCELLANEOUS) ×3 IMPLANT
KIT ROOM TURNOVER OR (KITS) ×3 IMPLANT
LIQUID BAND (GAUZE/BANDAGES/DRESSINGS) ×3 IMPLANT
MILL MEDIUM DISP (BLADE) ×3 IMPLANT
NEEDLE HYPO 25X1 1.5 SAFETY (NEEDLE) ×3 IMPLANT
NEEDLE SPNL 18GX3.5 QUINCKE PK (NEEDLE) IMPLANT
NS IRRIG 1000ML POUR BTL (IV SOLUTION) ×6 IMPLANT
PACK LAMINECTOMY NEURO (CUSTOM PROCEDURE TRAY) ×3 IMPLANT
PAD ARMBOARD 7.5X6 YLW CONV (MISCELLANEOUS) ×6 IMPLANT
ROD RADIUS 35MM (Rod) ×6 IMPLANT
SCREW 6.75X45MM (Screw) ×12 IMPLANT
SLEEVE SURGEON STRL (DRAPES) ×3 IMPLANT
SPACER OPAL 10X24 12MM (Spacer) ×6 IMPLANT
SPONGE LAP 4X18 X RAY DECT (DISPOSABLE) IMPLANT
SPONGE SURGIFOAM ABS GEL 100 (HEMOSTASIS) ×3 IMPLANT
STRIP CLOSURE SKIN 1/2X4 (GAUZE/BANDAGES/DRESSINGS) IMPLANT
SUT ETHILON 3 0 FSL (SUTURE) ×3 IMPLANT
SUT PROLENE 6 0 BV (SUTURE) IMPLANT
SUT VIC AB 0 CT1 18XCR BRD8 (SUTURE) ×2 IMPLANT
SUT VIC AB 0 CT1 8-18 (SUTURE) ×4
SUT VIC AB 2-0 CT1 18 (SUTURE) ×3 IMPLANT
SUT VIC AB 3-0 SH 8-18 (SUTURE) ×3 IMPLANT
TOWEL OR 17X24 6PK STRL BLUE (TOWEL DISPOSABLE) ×3 IMPLANT
TOWEL OR 17X26 10 PK STRL BLUE (TOWEL DISPOSABLE) ×3 IMPLANT
TRAY FOLEY CATH SILVER 16FR LF (SET/KITS/TRAYS/PACK) ×3 IMPLANT
WATER STERILE IRR 1000ML POUR (IV SOLUTION) ×3 IMPLANT

## 2015-03-06 NOTE — Transfer of Care (Signed)
Immediate Anesthesia Transfer of Care Note  Patient: Stacey Gross  Procedure(s) Performed: Procedure(s) with comments: Lumbar four- five posterior lumbar interbody fusion with interbody prosthesis posterior lateral arthrodesis and posterior segmental instrumentation with removal of existing hardware (N/A) - L45 posterior lumbar interbody fusion with interbody prosthesis posterior lateral arthrodesis and posterior segmental instrumentation with removal of existing hardware  Patient Location: PACU  Anesthesia Type:General  Level of Consciousness: awake and alert   Airway & Oxygen Therapy: Patient Spontanous Breathing and Patient connected to nasal cannula oxygen  Post-op Assessment: Report given to RN, Post -op Vital signs reviewed and stable and Patient moving all extremities  Post vital signs: Reviewed and stable  Last Vitals:  Filed Vitals:   03/06/15 0822  BP: 144/75  Pulse: 95  Temp: 37.3 C  Resp: 20    Complications: No apparent anesthesia complications

## 2015-03-06 NOTE — H&P (Signed)
BP 144/75 mmHg  Pulse 95  Temp(Src) 99.1 F (37.3 C) (Oral)  Resp 20  Ht 5\' 6"  (1.676 m)  Wt 91.173 kg (201 lb)  BMI 32.46 kg/m2  SpO2 95%  Stacey Gross comes in today at the behest of Dr. Golden Pop.  She has a somewhat complicated history, having sustained a back injury in 2004 and undergone a lumbar fusion at L5-S1 in, I believe, 2009.  This was complicated by a postoperative hematoma for which she was taken to the operating room and evacuated soon after operation and then subsequently by wound infection.  Her hardware was never removed.  She has been told that she needs to take antibacterials for the rest of her life as a result of the hardware being in place.  She has no new films but states that she, for the last nine months, has had pain in the back, buttocks, and left lower extremity.  She mentions no discomfort or problem on the right side.  She has had prednisone tapers.  She does take pain medication, which she says is not helpful, and she has had very little, if any, relief.  She is 54 years of age, right-handed.     She states that the pain is so severe that she seems to have fallen twice, though it is not exactly clear if that is just when her legs gave out on her.     She has been seen and evaluated by Dr. Michel Bickers.     REVIEW OF SYSTEMS:                                    Review of systems positive for weight loss, night sweats, eye infections, hypertension, hypercholesterolemia, swelling in the lower extremities, leg pain with walking and at rest, shortness of breath, nausea, abdominal pain, back pain, joint pain, leg weakness, diabetes.  She denies skin, neurological, psychiatric, hematologic, or allergic problems.      PAST MEDICAL HISTORY:                                Past medical history is significant for hypertension, diabetes and some kidney problems              Medications and Allergies:  SHE HAS AN ALLERGY TO DOXYCYCLINE, LATEX AND VANCOMYCIN.  She  takes atorvastatin, Invokana, duloxetine, lisinopril, meloxicam, metformin, omeprazole, pioglitazone, sitagliptin, and Bactrim, which she takes twice a day.     FAMILY HISTORY:                                              She provided no history about her mother or father.          SOCIAL HISTORY:                                            She does smoke, two packs of cigarettes a day.  She does drink alcohol socially.  She does not have a history of substance abuse.     PHYSICAL EXAMINATION:  Vital signs are as follows:  Height 5 feet 6.5 inches, weight 209.2 pounds, blood pressure is 147/82, pulse is 83, temperature is 99.2.  Pain is 8/10.     NEUROLOGICAL EXAMINATION:                       On physical exam she is alert, oriented x4 and answers all questions appropriately.  Memory, language, attention span and fund of knowledge are normal.  Well-kempt and in minor distress.  Markedly antalgic walk, favors the left lower extremity.  Toe walking is done with great difficulty as she hops, essentially, on the left lower extremity.  She can heel walk.  Hip flexors show approximately normal strength, though they are weaker on the left than when compared to the right.  Hamstrings and quadriceps also seem slightly weaker on the left in comparison to the right but would be 5/5 on an objective basis.  Reflexes 2+ at the knees, ankles, biceps, triceps, brachioradialis. Symmetric facial sensation and movement.  Uvula elevates in the midline.  Shoulder shrug is normal.  Tongue protrudes in the midline.      IMAGING STUDIES:                                          I did look at a plain x-ray of the lumbar spine.  It seems to show what is a solid fusion at L5-S1.  She has a non-segmental construct with a crosslink.  She also had two x-rays of the hips, which I reviewed.  X-ray of the left and of the right both showed osteoarthritis.  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 Thus I believe that she needs a decompressive laminectomy and fusion since this adjacent to the fused segement at L5/S1. Risks and benefits were discussed including but not limited to bleeding, infection, need for further surgery, no pain relief, hardware failure, nerve damage, weakness in one or both lower extremities, bowel and or bladder dysfunction. She understands and wishes to proceed.

## 2015-03-06 NOTE — Progress Notes (Signed)
..  Pt arrived to 5C08 @ 1831, Pt A&Ox 4, c/o pain 0/10. Dressing CDI,. Pt VS taken, Fluids runningat 75 cc/hr. Foley intact, unclamped. Pt without distress. Family at the bedside. Diet ordered, will monitor.

## 2015-03-06 NOTE — Op Note (Signed)
03/06/2015  5:02 PM  PATIENT:  Stacey Gross  54 y.o. female with severe stenosis at L4/5 and pain consistent with neurogenic claudication.   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, neurogenic claudication  PROCEDURE:  Procedure(s): Lumbar four- five posterior lumbar interbody fusion with interbody Peek prosthesis, 12 x 2mm, morselized autograft Lumbar decompression L4, L5 roots bilaterally, beyond the needed exposure for a PLIF  posterior lateral arthrodesis L4-5 morselized allograft   posterior segmental instrumentation L4/5(Stryker), 6.75x19mm x4 screws L4, L5 pedicle screw removal(Zimmer)  SURGEON:  Surgeon(s): Ashok Pall, MD Kevan Ny Ditty, MD  ASSISTANTS:Ditty, Marland Kitchen  ANESTHESIA:   general  EBL:  Total I/O In: 3000 [I.V.:3000] Out: 670 [Urine:270; Blood:400]  BLOOD ADMINISTERED:none  CELL SAVER GIVEN:none  COUNT:per nursing  DRAINS: none   SPECIMEN:  No Specimen  DICTATION: Stacey Gross is a 54 y.o. female whom was taken to the operating room intubated, and placed under a general anesthetic without difficulty. A foley catheter was placed under sterile conditions. He was positioned prone on a Jackson stable with all pressure points properly padded.  His lumbar region was prepped and draped in a sterile manner. I infiltrated occ's 1/2%lidocaine/1:2000,000 strength epinephrine into the planned incision. I opened the skin with a 10 blade and took the incision down to the thoracolumbar fascia. I exposed the lamina of L3, and 4 in a subperiosteal fashion bilaterally. I confirmed my location with an intraoperative xray.  I placed self retaining retractors and started the hardware removal. I used the monopolar cautery to expose the hardware on both sides. I then was able to remove the screws and rods. I decompressed the spinal canal at L4,5 via a  complete laminectomy of L4, well beyond the exposure needed just to perform the interbody arthordeses, and partial laminectomy of L5. I performed complete inferior facetectomies at L4 in order to decompress the lateral recesses and the L4 nerve roots. I performed foraminotomies of L5 to decompress the L5 roots. With the decompression completed moved on to the PLIF. PLIF's were performed at L4/5 in the same fashion. I opened the disc space with a 15 blade then used a variety of instruments to remove the disc and prepare the space for the arthrodesis. I used curettes, rongeurs, punches, shavers for the disc space, and rasps in the discetomy. I measured the disc space and placed 40mmx 67mm  Peek cages(Synthes) into the disc space(s). I packed the disc spaces and the cages with morselized autograft I decorticated the lateral bone at L4, and L5. I then placed magnafuse on the decorticated surfaces to complete the posterolateral arthrodesis.  We placed pedicle screws at L4, and L5, using fluoroscopic guidance. I drilled a pilot hole, then cannulated the pedicles at L4 with a bone probe at each site. I then tapped each L4 pedicle, assessing each site for pedicle violations. No cutouts were appreciated. Screws (Stryker) were then placed at each site without difficulty.I removed 6.47mm screws and used the 6.72mm screws at both L5, and at L4. 20mm screws were used at each level.  Final films were performed and all screws appeared to be in good position. We connected the rods, and secured them with locking caps. I closed the wound in a layered fashion. We approximated the thoracolumbar fascia, subcutaneous, and subcuticular planes with vicryl sutures. I approximated the skin edges with nylon. I used an occlusive bandage for a sterile dressing.  PLAN OF CARE: Admit to inpatient   PATIENT DISPOSITION:  PACU - hemodynamically stable.   Delay start of Pharmacological VTE agent (>24hrs) due to surgical blood loss or  risk of bleeding:  yes

## 2015-03-06 NOTE — Anesthesia Procedure Notes (Signed)
Procedure Name: Intubation Date/Time: 03/06/2015 12:12 PM Performed by: Gaylene Brooks Pre-anesthesia Checklist: Patient identified, Timeout performed, Emergency Drugs available, Suction available and Patient being monitored Patient Re-evaluated:Patient Re-evaluated prior to inductionOxygen Delivery Method: Circle system utilized Preoxygenation: Pre-oxygenation with 100% oxygen Intubation Type: IV induction Ventilation: Mask ventilation without difficulty and Oral airway inserted - appropriate to patient size Laryngoscope Size: Sabra Heck and 2 Grade View: Grade I Tube type: Oral Tube size: 7.0 mm Number of attempts: 1 Airway Equipment and Method: Stylet Placement Confirmation: ETT inserted through vocal cords under direct vision,  breath sounds checked- equal and bilateral,  positive ETCO2 and CO2 detector Secured at: 21 cm Tube secured with: Tape Dental Injury: Teeth and Oropharynx as per pre-operative assessment

## 2015-03-06 NOTE — Anesthesia Preprocedure Evaluation (Addendum)
Anesthesia Evaluation  Patient identified by MRN, date of birth, ID band Patient awake    Reviewed: Allergy & Precautions, NPO status , Patient's Chart, lab work & pertinent test results  Airway Mallampati: II  TM Distance: >3 FB Neck ROM: Full    Dental  (+) Dental Advisory Given, Upper Dentures   Pulmonary Current Smoker (43 pack years),    Pulmonary exam normal breath sounds clear to auscultation       Cardiovascular hypertension, Pt. on medications (-) angina(-) CAD and (-) Past MI Normal cardiovascular exam Rhythm:Regular Rate:Normal     Neuro/Psych PSYCHIATRIC DISORDERS Depression Spinal stenosis     GI/Hepatic Neg liver ROS, GERD  Medicated,  Endo/Other  diabetes, Type 2, Oral Hypoglycemic AgentsObesity   Renal/GU negative Renal ROS     Musculoskeletal  (+) Arthritis , Osteoarthritis,    Abdominal   Peds  Hematology negative hematology ROS (+)   Anesthesia Other Findings Day of surgery medications reviewed with the patient.  Reproductive/Obstetrics                          Anesthesia Physical Anesthesia Plan  ASA: III  Anesthesia Plan: General   Post-op Pain Management:    Induction: Intravenous  Airway Management Planned: Oral ETT  Additional Equipment:   Intra-op Plan:   Post-operative Plan: Extubation in OR  Informed Consent: I have reviewed the patients History and Physical, chart, labs and discussed the procedure including the risks, benefits and alternatives for the proposed anesthesia with the patient or authorized representative who has indicated his/her understanding and acceptance.   Dental advisory given  Plan Discussed with: CRNA  Anesthesia Plan Comments: (Risks/benefits of general anesthesia discussed with patient including risk of damage to teeth, lips, gum, and tongue, nausea/vomiting, allergic reactions to medications, and the possibility of heart  attack, stroke and death.  All patient questions answered.  Patient wishes to proceed.)       Anesthesia Quick Evaluation

## 2015-03-06 NOTE — Progress Notes (Signed)
Utilization review completed.  

## 2015-03-07 LAB — TYPE AND SCREEN
ABO/RH(D): O POS
Antibody Screen: NEGATIVE

## 2015-03-07 LAB — HEMOGLOBIN A1C
HEMOGLOBIN A1C: 5.8 % — AB (ref 4.8–5.6)
MEAN PLASMA GLUCOSE: 120 mg/dL

## 2015-03-07 MED ORDER — OXYCODONE-ACETAMINOPHEN 5-325 MG PO TABS
1.0000 | ORAL_TABLET | ORAL | Status: DC | PRN
  Administered 2015-03-07: 1 via ORAL
  Administered 2015-03-07 – 2015-03-08 (×3): 2 via ORAL
  Filled 2015-03-07: qty 1
  Filled 2015-03-07 (×3): qty 2

## 2015-03-07 NOTE — Evaluation (Signed)
Physical Therapy Evaluation Patient Details Name: Stacey Gross MRN: [id number] DOB: 05/21/1960 Today's Date: 03/07/2015   History of Present Illness  54 y.o. s/p Lumbar four- five posterior lumbar interbody fusion with interbody prosthesis posterior lateral arthrodesis and posterior segmental instrumentation with removal of existing hardware. Pt with history of previous back surgery.  Clinical Impression  Patient ready for discharge home.  Will attempt to see in am to ensure she does not have any final questions for therapy.  She is well-versed in DME use and mobility with RW.  Patient doing well with general mobility and has adequate support at home.  Pt safe to walk on unit with RW.    Follow Up Recommendations No PT follow up;Supervision - Intermittent    Equipment Recommendations  None recommended by PT    Recommendations for Other Services       Precautions / Restrictions Precautions Precautions: Back Precaution Booklet Issued: Yes (comment) Precaution Comments: reviewed back precautions with pt and sleeping posture Required Braces or Orthoses: Spinal Brace Spinal Brace: Lumbar corset;Applied in sitting position Restrictions Weight Bearing Restrictions: No      Mobility  Bed Mobility Overal bed mobility: Modified Independent Bed Mobility: Sidelying to Sit   Sidelying to sit: Modified independent (Device/Increase time);HOB elevated (rail)       General bed mobility comments: reviwed bed mobility strategies to manage pain  Transfers Overall transfer level: Modified independent Equipment used: Rolling walker (2 wheeled) Transfers: Sit to/from Stand Sit to Stand: Supervision         General transfer comment: decr hand positioning safety (held to walker upon standing)  Ambulation/Gait Ambulation/Gait assistance: Supervision Ambulation Distance (Feet): 500 Feet Assistive device: Rolling walker (2 wheeled) Gait Pattern/deviations: Step-through pattern      General Gait Details: guarded posture  Stairs Stairs: Yes Stairs assistance: Min guard Stair Management: One rail Right;Forwards;Step to pattern Number of Stairs: 10 General stair comments: ascend with Rt leg, decr Lt hip stability upon trial of leading with left  Wheelchair Mobility    Modified Rankin (Stroke Patients Only)       Balance Overall balance assessment: Needs assistance Sitting-balance support: No upper extremity supported;Feet supported Sitting balance-Leahy Scale: Good     Standing balance support: No upper extremity supported Standing balance-Leahy Scale: Fair                               Pertinent Vitals/Pain Pain Assessment: 0-10 Pain Score: 4  Pain Location: back at incision site (alleviated by wearing brace) Pain Descriptors / Indicators: Sore Pain Intervention(s): Limited activity within patient's tolerance;Monitored during session    Home Living Family/patient expects to be discharged to:: Private residence Living Arrangements: Other relatives (sister) Available Help at Discharge: Family;Available 24 hours/day Type of Home: Mobile home Home Access: Stairs to enter Entrance Stairs-Rails: Can reach both Entrance Stairs-Number of Steps: 2 Home Layout: One level Home Equipment: Walker - 2 wheels;Walker - 4 wheels;Cane - single point;Shower seat;Wheelchair - manual;Toilet riser;Adaptive equipment      Prior Function Level of Independence: Independent               Hand Dominance   Dominant Hand: Right    Extremity/Trunk Assessment   Upper Extremity Assessment: Overall WFL for tasks assessed           Lower Extremity Assessment: Generalized weakness (decr left hip strength noted during stair training)      Cervical / Trunk Assessment:  Normal  Communication   Communication: No difficulties  Cognition Arousal/Alertness: Awake/alert Behavior During Therapy: WFL for tasks assessed/performed Overall Cognitive  Status: Within Functional Limits for tasks assessed                      General Comments General comments (skin integrity, edema, etc.): clean, dry dressing, min assist to don back brace    Exercises        Assessment/Plan    PT Assessment Patient needs continued PT services  PT Diagnosis Abnormality of gait;Acute pain   PT Problem List Decreased strength;Decreased activity tolerance;Decreased balance;Decreased knowledge of precautions;Pain  PT Treatment Interventions DME instruction;Gait training;Stair training;Functional mobility training;Therapeutic exercise;Therapeutic activities;Balance training;Patient/family education   PT Goals (Current goals can be found in the Care Plan section) Acute Rehab PT Goals Patient Stated Goal: go home as soon as possible PT Goal Formulation: With patient Time For Goal Achievement: 03/13/15 Potential to Achieve Goals: Good    Frequency Min 3X/week   Barriers to discharge        Co-evaluation               End of Session Equipment Utilized During Treatment: Gait belt;Back brace Activity Tolerance: Patient tolerated treatment well;No increased pain Patient left: in bed;with call bell/phone within reach (sitting EOB)           Time: [id number] PT Time Calculation (min) (ACUTE ONLY): 30 min   Charges:   PT Evaluation $Initial PT Evaluation Tier I: 1 Procedure PT Treatments $Gait Training: 8-22 mins   PT G CodesMalka So, PT [phone]  Clint 03/07/2015, 7:19 PM

## 2015-03-07 NOTE — Evaluation (Signed)
Occupational Therapy Evaluation Patient Details Name: Stacey Gross MRN: [id number] DOB: Jul 28, 1960 Today's Date: 03/07/2015    History of Present Illness 54 y.o. s/p Lumbar four- five posterior lumbar interbody fusion with interbody prosthesis posterior lateral arthrodesis and posterior segmental instrumentation with removal of existing hardware. Pt with history of previous back surgery.   Clinical Impression   Pt s/p above. Education provided in session and pt verbalized understanding of information covered. OT signing off.    Follow Up Recommendations  No OT follow up;Supervision - Intermittent    Equipment Recommendations  None recommended by OT    Recommendations for Other Services       Precautions / Restrictions Precautions Precautions: Back Precaution Booklet Issued: No Precaution Comments: reviewed back precautions with pt Required Braces or Orthoses: Spinal Brace Spinal Brace: Lumbar corset;Applied in sitting position Restrictions Weight Bearing Restrictions: No      Mobility Bed Mobility Overal bed mobility: Needs Assistance Bed Mobility: Rolling;Sidelying to Sit;Sit to Sidelying Rolling: Min assist Sidelying to sit: Supervision     Sit to sidelying: Supervision General bed mobility comments: cues for technique.   Transfers Overall transfer level: Needs assistance   Transfers: Sit to/from Stand Sit to Stand: Min guard              Balance  No LOB in session-slightly unsteady.                                          ADL Overall ADL's : Needs assistance/impaired Eating/Feeding: Independent;Sitting               Upper Body Dressing : Set up;Supervision/safety;Sitting   Lower Body Dressing: Minimal assistance;With adaptive equipment;Sit to/from stand   Toilet Transfer: Min guard;Ambulation (ambulated with and without RW)           Functional mobility during ADLs: Min guard;Rolling walker (ambulated with and  without RW) General ADL Comments: Educated on AE. Discussed incorporating precautions into functional activities such as using a cup for oral care, placement of grooming items, reaching head. Instructed to have clothing under brace and not sleeping in it. Educated on use of bag on walker and sitting for LB bathing. Recommended someone be with her for shower transfer and bathing and discussed shower transfer.     Vision     Perception     Praxis      Pertinent Vitals/Pain Pain Assessment: 0-10 Pain Score:  (6-7) Pain Location: back-once she was sitting EOB Pain Intervention(s): Monitored during session     Hand Dominance Right   Extremity/Trunk Assessment Upper Extremity Assessment Upper Extremity Assessment: Overall WFL for tasks assessed   Lower Extremity Assessment Lower Extremity Assessment: Defer to PT evaluation       Communication Communication Communication: No difficulties   Cognition Arousal/Alertness: Awake/alert Behavior During Therapy: WFL for tasks assessed/performed Overall Cognitive Status: Within Functional Limits for tasks assessed                     General Comments       Exercises       Shoulder Instructions      Home Living Family/patient expects to be discharged to:: Private residence Living Arrangements: Other relatives (sister; cousin lives next door) Available Help at Discharge: Family;Available 24 hours/day (boyfriend can also assist) Type of Home: Mobile home Home Access: Stairs to enter [employer] of Steps:  2 Entrance Stairs-Rails: Right;Left;Can reach both Home Layout: One level     Bathroom Shower/Tub: Tub/shower unit;Walk-in shower         Home Equipment: Gilford Rile - 4 wheels;Cane - single point;Shower seat;Adaptive equipment;Wheelchair - Higher education careers adviser: [employer]        Prior Functioning/Environment Level of Independence: Independent             OT Diagnosis: Acute pain   OT Problem  List:     OT Treatment/Interventions:      OT Goals(Current goals can be found in the care plan section) Acute Rehab OT Goals Patient Stated Goal: go home  OT Frequency:     Barriers to D/C:            Co-evaluation              End of Session Equipment Utilized During Treatment: Gait belt;Rolling walker;Back brace Nurse Communication: Mobility status;Other (comment) (became nauseous)  Activity Tolerance: Patient tolerated treatment well Patient left: in bed;with call bell/phone within reach;with bed alarm set   Time: 1341-1402 OT Time Calculation (min): 21 min Charges:  OT General Charges $OT Visit: 1 Procedure OT Evaluation $Initial OT Evaluation Tier I: 1 Procedure G-CodesBenito Mccreedy OTR/L [id number] 03/07/2015, 2:17 PM

## 2015-03-07 NOTE — Anesthesia Postprocedure Evaluation (Signed)
Anesthesia Post Note  Patient: Stacey Gross  Procedure(s) Performed: Procedure(s) (LRB): Lumbar four- five posterior lumbar interbody fusion with interbody prosthesis posterior lateral arthrodesis and posterior segmental instrumentation with removal of existing hardware (N/A)  Patient location during evaluation: PACU Anesthesia Type: General Level of consciousness: sedated Pain management: pain level controlled Vital Signs Assessment: post-procedure vital signs reviewed and stable Respiratory status: spontaneous breathing and respiratory function stable Cardiovascular status: stable Anesthetic complications: no    Last Vitals:  Filed Vitals:   03/07/15 0130 03/07/15 0454  BP: 107/87 102/52  Pulse: 76 75  Temp: 36.8 C 37.2 C  Resp: 18 18    Last Pain:  Filed Vitals:   03/07/15 0458  PainSc: 8                  Kandy Towery DANIEL

## 2015-03-07 NOTE — Progress Notes (Signed)
Filed Vitals:   03/06/15 1826 03/06/15 2150 03/07/15 0130 03/07/15 0454  BP: 126/73 101/59 107/87 102/52  Pulse: 70 78 76 75  Temp: 97.6 F (36.4 C) 98.1 F (36.7 C) 98.3 F (36.8 C) 98.9 F (37.2 C)  TempSrc: Oral Oral Oral Oral  Resp: 19 18 18 18   Height: 5\' 6"  (1.676 m)     Weight: 89.812 kg (198 lb)     SpO2: 100% 98% 94% 93%    CBC  Recent Labs  03/06/15 0859  WBC 7.9  HGB 13.8  HCT 42.0  PLT 270   BMET  Recent Labs  03/06/15 0859  NA 141  K 4.1  CL 107  CO2 26  GLUCOSE 105*  BUN 11  CREATININE 0.78  CALCIUM 9.5    Patient has been up and ambulating in the room, but has not yet walked in the hall. Dressing dry and intact. Pain not well controlled with IV Dilaudid because of only brief duration, will start on by mouth pain medication for better pain control. Encouraged to ambulate in the hall. Foley DC'd about 3 hours ago, no void yet, nursing staff to monitor voiding function.  Plan: Continue to progress through postoperative recovery.  Encouraged to ambulate in halls, ordered Percocet for analgesia.  Hosie Spangle, MD 03/07/2015, 9:21 AM

## 2015-03-08 MED ORDER — OXYCODONE-ACETAMINOPHEN 5-325 MG PO TABS: 1.0000 | ORAL_TABLET | ORAL | Status: DC | PRN

## 2015-03-08 MED ORDER — METHOCARBAMOL 750 MG PO TABS: 750.0000 mg | ORAL_TABLET | Freq: Four times a day (QID) | ORAL | Status: DC

## 2015-03-08 MED ORDER — DIAZEPAM 5 MG PO TABS: 5.0000 mg | ORAL_TABLET | Freq: Four times a day (QID) | ORAL | Status: DC | PRN

## 2015-03-08 NOTE — Discharge Summary (Signed)
Date of admission: 12-16  Date of discharge: 03/08/2015  Admission diagnosis: L4-5 spondylosis, L4-5 stenosis, neurogenic claudication  Discharge diagnosis: Same  Procedure performed: L4-5 posterior lumbar interbody fusion  Hospital course: Stacey Gross was admitted to the hospital morning surgery taken the operating room for the above listed operation. She tolerated it well. She was neurologically intact postoperatively. Over the accommodation increase her ambulation. She is discharged home today in stable medical neurological condition.  Follow-up: With Dr. Gloriann Loan in 3 weeks  Discharge medications: Resume prior medications, Percocet for pain, Valium and Robaxin for muscle spasms

## 2015-03-08 NOTE — Progress Notes (Signed)
Doing well after 4-5 PLIF Full strength Bandage reinforced, some serous drainage Home today

## 2015-03-26 ENCOUNTER — Encounter: Payer: Self-pay | Admitting: Family Medicine

## 2015-03-26 ENCOUNTER — Ambulatory Visit (INDEPENDENT_AMBULATORY_CARE_PROVIDER_SITE_OTHER): Payer: Medicare Other | Admitting: Family Medicine

## 2015-03-26 VITALS — BP 135/73 | HR 69 | Temp 98.5°F | Ht 67.0 in | Wt 193.0 lb

## 2015-03-26 DIAGNOSIS — M4326 Fusion of spine, lumbar region: Secondary | ICD-10-CM | POA: Diagnosis not present

## 2015-03-26 DIAGNOSIS — E119 Type 2 diabetes mellitus without complications: Secondary | ICD-10-CM

## 2015-03-26 DIAGNOSIS — J411 Mucopurulent chronic bronchitis: Secondary | ICD-10-CM

## 2015-03-26 DIAGNOSIS — J449 Chronic obstructive pulmonary disease, unspecified: Secondary | ICD-10-CM | POA: Insufficient documentation

## 2015-03-26 MED ORDER — AZITHROMYCIN 250 MG PO TABS: ORAL_TABLET | ORAL | Status: DC

## 2015-03-26 MED ORDER — FLUCONAZOLE 150 MG PO TABS: 150.0000 mg | ORAL_TABLET | Freq: Once | ORAL | Status: DC

## 2015-03-26 MED ORDER — HYDROCODONE-ACETAMINOPHEN 10-325 MG PO TABS: 1.0000 | ORAL_TABLET | Freq: Three times a day (TID) | ORAL | Status: DC | PRN

## 2015-03-26 NOTE — Assessment & Plan Note (Signed)
Discussed acute exacerbation chronic bronchitis Encouraged to quit smoking and strongly Will start azithromycin May need pulmonary consult

## 2015-03-26 NOTE — Assessment & Plan Note (Signed)
For pain will try Vicodin

## 2015-03-26 NOTE — Progress Notes (Signed)
BP 135/73 mmHg  Pulse 69  Temp(Src) 98.5 F (36.9 C)  Ht 5\' 7"  (1.702 m)  Wt 193 lb (87.544 kg)  BMI 30.22 kg/m2  SpO2 95%   Subjective:    Patient ID: Stacey Gross, female    DOB: 09/22/1960, 54 y.o.   MRN: [id number]  HPI: Stacey Gross is a 54 y.o. female  Chief Complaint  Patient presents with  . Diabetes  . URI    follow-up had back surgery earlier this month had a stormy course with some bleeding had to go back in every suturing Been taking Percocet for pain and getting marked nausea and throwing up from Percocets unable to take that needs something else for pain Took tramadol prior to surgery and that didn't do any good at all  Patient's cold is noted a month ago got a little better on Augmentin but not much need something else continues to smoke like a chimney. Patient's diabetes is been doing well hemoglobin A1c of 5.8 done at the hospital  Relevant past medical, surgical, family and social history reviewed and updated as indicated. Interim medical history since our last visit reviewed. Allergies and medications reviewed and updated.  Review of Systems  Constitutional: Positive for fatigue.  HENT: Negative.   Respiratory:       Marked cough and congestion  Cardiovascular: Negative.   Genitourinary:       Complaints of a vaginal yeast infection secondary to antibiotics and hospitalization    Per HPI unless specifically indicated above     Objective:    BP 135/73 mmHg  Pulse 69  Temp(Src) 98.5 F (36.9 C)  Ht 5\' 7"  (1.702 m)  Wt 193 lb (87.544 kg)  BMI 30.22 kg/m2  SpO2 95%  Wt Readings from Last 3 Encounters:  03/26/15 193 lb (87.544 kg)  03/06/15 198 lb (89.812 kg)  02/25/15 201 lb (91.173 kg)    Physical Exam  Constitutional: She is oriented to person, place, and time. She appears well-developed and well-nourished. No distress.  HENT:  Head: Normocephalic and atraumatic.  Right Ear: Hearing normal.  Left Ear: Hearing normal.  Nose: Nose  normal.  Mouth/Throat: Oropharyngeal exudate present.  Eyes: Conjunctivae and lids are normal. Right eye exhibits no discharge. Left eye exhibits no discharge. No scleral icterus.  Cardiovascular: Normal rate, regular rhythm and normal heart sounds.   Pulmonary/Chest: Effort normal. No respiratory distress.  Rhonchi and wheeze  Musculoskeletal: Normal range of motion.  Neurological: She is alert and oriented to person, place, and time.  Skin: Skin is intact. No rash noted.  Psychiatric: She has a normal mood and affect. Her speech is normal and behavior is normal. Judgment and thought content normal. Cognition and memory are normal.    Results for orders placed or performed during the hospital encounter of 03/06/15  Surgical pcr screen  Result Value Ref Range   MRSA, PCR NEGATIVE NEGATIVE   Staphylococcus aureus NEGATIVE NEGATIVE  Basic metabolic panel  Result Value Ref Range   Sodium 141 135 - 145 mmol/L   Potassium 4.1 3.5 - 5.1 mmol/L   Chloride 107 101 - 111 mmol/L   CO2 26 22 - 32 mmol/L   Glucose, Bld 105 (H) 65 - 99 mg/dL   BUN 11 6 - 20 mg/dL   Creatinine, Ser 0.78 0.44 - 1.00 mg/dL   Calcium 9.5 8.9 - 10.3 mg/dL   GFR calc non Af Amer >60 >60 mL/min   GFR calc Af Amer >60 >  60 mL/min   Anion gap 8 5 - 15  CBC  Result Value Ref Range   WBC 7.9 4.0 - 10.5 K/uL   RBC 4.37 3.87 - 5.11 MIL/uL   Hemoglobin 13.8 12.0 - 15.0 g/dL   HCT 42.0 36.0 - 46.0 %   MCV 96.1 78.0 - 100.0 fL   MCH 31.6 26.0 - 34.0 pg   MCHC 32.9 30.0 - 36.0 g/dL   RDW 13.2 11.5 - 15.5 %   Platelets 270 150 - 400 K/uL  Hemoglobin A1c  Result Value Ref Range   Hgb A1c MFr Bld 5.8 (H) 4.8 - 5.6 %   Mean Plasma Glucose 120 mg/dL  Glucose, capillary  Result Value Ref Range   Glucose-Capillary 116 (H) 65 - 99 mg/dL  Glucose, capillary  Result Value Ref Range   Glucose-Capillary 93 65 - 99 mg/dL  Type and screen All Cardiac and thoracic surgeries, spinal fusions, myomectomies, craniotomies, colon &  liver resections, total joint revisions, same day c-section with placenta previa or accreta.  Result Value Ref Range   ABO/RH(D) O POS    Antibody Screen NEG    Sample Expiration 03/09/2015   ABO/Rh  Result Value Ref Range   ABO/RH(D) O POS       Assessment & Plan:   Problem List Items Addressed This Visit      Respiratory   COPD (chronic obstructive pulmonary disease) ([place])    Discussed acute exacerbation chronic bronchitis Encouraged to quit smoking and strongly Will start azithromycin May need pulmonary consult      Relevant Medications   azithromycin (ZITHROMAX) 250 MG tablet     Endocrine   Diabetes ([place])    The current medical regimen is effective;  continue present plan and medications.         Musculoskeletal and Integument   Fusion of spine of lumbar region    For pain will try Vicodin       Other Visit Diagnoses    Diabetes mellitus without complication ([place])    -  Primary        Follow up plan: Return in about 3 months (around 06/24/2015) for a1c.

## 2015-03-26 NOTE — Assessment & Plan Note (Signed)
The current medical regimen is effective;  continue present plan and medications.  

## 2015-04-07 DIAGNOSIS — M5416 Radiculopathy, lumbar region: Secondary | ICD-10-CM | POA: Diagnosis not present

## 2015-04-09 ENCOUNTER — Ambulatory Visit: Payer: Self-pay | Admitting: Family Medicine

## 2015-04-16 ENCOUNTER — Telehealth: Payer: Self-pay | Admitting: Family Medicine

## 2015-04-16 NOTE — Telephone Encounter (Signed)
Pt has a bad cold and would like to [facility]

## 2015-04-16 NOTE — Telephone Encounter (Signed)
Patient will need to be seen, please get patient scheduled for an appointment. If we do not have any available, please suggest [facility].

## 2015-04-20 NOTE — Telephone Encounter (Signed)
Called patient to schedule appointment. Patient states she is getting better and doesn't feel like she needs to schedule an appointment. I asked for for her to please give [place] a call to schedule an appointment if she starts feeling worse.

## 2015-06-09 ENCOUNTER — Encounter: Payer: Self-pay | Admitting: Family Medicine

## 2015-06-09 ENCOUNTER — Ambulatory Visit (INDEPENDENT_AMBULATORY_CARE_PROVIDER_SITE_OTHER): Payer: Medicare Other | Admitting: Family Medicine

## 2015-06-09 VITALS — BP 120/77 | HR 75 | Temp 98.8°F | Ht 66.7 in | Wt 189.0 lb

## 2015-06-09 DIAGNOSIS — J411 Mucopurulent chronic bronchitis: Secondary | ICD-10-CM | POA: Diagnosis not present

## 2015-06-09 DIAGNOSIS — I10 Essential (primary) hypertension: Secondary | ICD-10-CM | POA: Diagnosis not present

## 2015-06-09 DIAGNOSIS — E119 Type 2 diabetes mellitus without complications: Secondary | ICD-10-CM | POA: Diagnosis not present

## 2015-06-09 LAB — BAYER DCA HB A1C WAIVED: HB A1C: 5.9 % (ref <7.0)

## 2015-06-09 NOTE — Assessment & Plan Note (Signed)
Readings doing okay still smoking Scuffs smoking cessation patient not ready

## 2015-06-09 NOTE — Progress Notes (Signed)
BP 120/77 mmHg  Pulse 75  Temp(Src) 98.8 F (37.1 C)  Ht 5' 6.7" (1.694 m)  Wt 189 lb (85.73 kg)  BMI 29.87 kg/m2  SpO2 99%   Subjective:    Patient ID: Stacey Gross, female    DOB: Oct 24, 1960, 55 y.o.   MRN: [id number]  HPI: Stacey Gross is a 55 y.o. female  Chief Complaint  Patient presents with  . Diabetes  . Patient has not taken any medication for past 2 weeks   Patient's diabetes good long-term control along with blood pressure went to the beach and didn't take her medicine and is done well anyway  been back on medicines for 4 days now. No complaints from medications With back doing better patient is been feeling betters been able to be more active She'll taking tramadol and sleeps well Relevant past medical, surgical, family and social history reviewed and updated as indicated. Interim medical history since our last visit reviewed. Allergies and medications reviewed and updated.  Review of Systems  Constitutional: Negative.   Respiratory: Negative.   Cardiovascular: Negative.     Per HPI unless specifically indicated above     Objective:    BP 120/77 mmHg  Pulse 75  Temp(Src) 98.8 F (37.1 C)  Ht 5' 6.7" (1.694 m)  Wt 189 lb (85.73 kg)  BMI 29.87 kg/m2  SpO2 99%  Wt Readings from Last 3 Encounters:  06/09/15 189 lb (85.73 kg)  03/26/15 193 lb (87.544 kg)  03/06/15 198 lb (89.812 kg)    Physical Exam  Constitutional: She is oriented to person, place, and time. She appears well-developed and well-nourished. No distress.  HENT:  Head: Normocephalic and atraumatic.  Right Ear: Hearing normal.  Left Ear: Hearing normal.  Nose: Nose normal.  Eyes: Conjunctivae and lids are normal. Right eye exhibits no discharge. Left eye exhibits no discharge. No scleral icterus.  Cardiovascular: Normal rate, regular rhythm and normal heart sounds.   Pulmonary/Chest: Effort normal and breath sounds normal. No respiratory distress.  Musculoskeletal: Normal range  of motion.  Neurological: She is alert and oriented to person, place, and time.  Skin: Skin is intact. No rash noted.  Psychiatric: She has a normal mood and affect. Her speech is normal and behavior is normal. Judgment and thought content normal. Cognition and memory are normal.    Results for orders placed or performed during the hospital encounter of 03/06/15  Surgical pcr screen  Result Value Ref Range   MRSA, PCR NEGATIVE NEGATIVE   Staphylococcus aureus NEGATIVE NEGATIVE  Basic metabolic panel  Result Value Ref Range   Sodium 141 135 - 145 mmol/L   Potassium 4.1 3.5 - 5.1 mmol/L   Chloride 107 101 - 111 mmol/L   CO2 26 22 - 32 mmol/L   Glucose, Bld 105 (H) 65 - 99 mg/dL   BUN 11 6 - 20 mg/dL   Creatinine, Ser 0.78 0.44 - 1.00 mg/dL   Calcium 9.5 8.9 - 10.3 mg/dL   GFR calc non Af Amer >60 >60 mL/min   GFR calc Af Amer >60 >60 mL/min   Anion gap 8 5 - 15  CBC  Result Value Ref Range   WBC 7.9 4.0 - 10.5 K/uL   RBC 4.37 3.87 - 5.11 MIL/uL   Hemoglobin 13.8 12.0 - 15.0 g/dL   HCT 42.0 36.0 - 46.0 %   MCV 96.1 78.0 - 100.0 fL   MCH 31.6 26.0 - 34.0 pg   MCHC 32.9 30.0 -  36.0 g/dL   RDW 13.2 11.5 - 15.5 %   Platelets 270 150 - 400 K/uL  Hemoglobin A1c  Result Value Ref Range   Hgb A1c MFr Bld 5.8 (H) 4.8 - 5.6 %   Mean Plasma Glucose 120 mg/dL  Glucose, capillary  Result Value Ref Range   Glucose-Capillary 116 (H) 65 - 99 mg/dL  Glucose, capillary  Result Value Ref Range   Glucose-Capillary 93 65 - 99 mg/dL  Type and screen All Cardiac and thoracic surgeries, spinal fusions, myomectomies, craniotomies, colon & liver resections, total joint revisions, same day c-section with placenta previa or accreta.  Result Value Ref Range   ABO/RH(D) O POS    Antibody Screen NEG    Sample Expiration 03/09/2015   ABO/Rh  Result Value Ref Range   ABO/RH(D) O POS       Assessment & Plan:   Problem List Items Addressed This Visit      Cardiovascular and Mediastinum    Hypertension    The current medical regimen is effective;  continue present plan and medications.         Respiratory   COPD (chronic obstructive pulmonary disease) (HCC)    Readings doing okay still smoking Scuffs smoking cessation patient not ready        Endocrine   Diabetes ([place]) - Primary    The current medical regimen is effective;  continue present plan and medications.       Relevant Orders   Bayer DCA Hb A1c Waived       Follow up plan: Return in about 3 months (around 09/09/2015) for BMP, lipids, alt, ast, a1c.

## 2015-06-09 NOTE — Assessment & Plan Note (Signed)
The current medical regimen is effective;  continue present plan and medications.  

## 2015-09-21 ENCOUNTER — Ambulatory Visit (INDEPENDENT_AMBULATORY_CARE_PROVIDER_SITE_OTHER): Payer: Medicare Other | Admitting: Family Medicine

## 2015-09-21 ENCOUNTER — Encounter: Payer: Self-pay | Admitting: Family Medicine

## 2015-09-21 VITALS — BP 109/72 | HR 71 | Temp 98.1°F | Ht 66.7 in | Wt 202.0 lb

## 2015-09-21 DIAGNOSIS — I10 Essential (primary) hypertension: Secondary | ICD-10-CM

## 2015-09-21 DIAGNOSIS — Z720 Tobacco use: Secondary | ICD-10-CM | POA: Diagnosis not present

## 2015-09-21 DIAGNOSIS — E119 Type 2 diabetes mellitus without complications: Secondary | ICD-10-CM | POA: Diagnosis not present

## 2015-09-21 DIAGNOSIS — E785 Hyperlipidemia, unspecified: Secondary | ICD-10-CM | POA: Diagnosis not present

## 2015-09-21 LAB — LP+ALT+AST PICCOLO, WAIVED
ALT (SGPT) PICCOLO, WAIVED: 14 U/L (ref 10–47)
AST (SGOT) PICCOLO, WAIVED: 34 U/L (ref 11–38)
Chol/HDL Ratio Piccolo,Waive: 6.5 mg/dL — ABNORMAL HIGH
Cholesterol Piccolo, Waived: 274 mg/dL — ABNORMAL HIGH (ref <200)
HDL CHOL PICCOLO, WAIVED: 42 mg/dL — AB (ref >59)
LDL Chol Calc Piccolo Waived: 171 mg/dL — ABNORMAL HIGH (ref <100)
TRIGLYCERIDES PICCOLO,WAIVED: 301 mg/dL — AB (ref <150)
VLDL Chol Calc Piccolo,Waive: 60 mg/dL — ABNORMAL HIGH (ref <30)

## 2015-09-21 LAB — BAYER DCA HB A1C WAIVED: HB A1C (BAYER DCA - WAIVED): 5.7 % (ref <7.0)

## 2015-09-21 MED ORDER — VARENICLINE TARTRATE 1 MG PO TABS: 1.0000 mg | ORAL_TABLET | Freq: Two times a day (BID) | ORAL | Status: DC

## 2015-09-21 MED ORDER — VARENICLINE TARTRATE 0.5 MG X 11 & 1 MG X 42 PO MISC: ORAL | Status: DC

## 2015-09-21 NOTE — Assessment & Plan Note (Signed)
The current medical regimen is effective;  continue present plan and medications.  

## 2015-09-21 NOTE — Assessment & Plan Note (Signed)
Has already been working on cutting back on amount of cigarettes, will start Chantix. Risks and side effects discussed. Patient ready to quit and very motivated.

## 2015-09-21 NOTE — Progress Notes (Signed)
BP 109/72 mmHg  Pulse 71  Temp(Src) 98.1 F (36.7 C)  Ht 5' 6.7" (1.694 m)  Wt 202 lb (91.627 kg)  BMI 31.93 kg/m2  SpO2 97%   Subjective:    Patient ID: Stacey Gross, female    DOB: Aug 11, 1960, 55 y.o.   MRN: [id number]  HPI: Stacey Gross is a 55 y.o. female  Chief Complaint  Patient presents with  . Hypertension  . Hyperlipidemia  . Diabetes  . Nicotine Dependence  Recheck medications doing well Patient not sure she is taking atorvastatin. Taking other medications without problems noted low blood sugar spells or other issues. The pressure doing well no complaints Patient's been off her diet as she is been at the beach eating out mostly brown food.   Relevant past medical, surgical, family and social history reviewed and updated as indicated. Interim medical history since our last visit reviewed. Allergies and medications reviewed and updated.  Review of Systems  Constitutional: Negative.   Respiratory: Negative.   Cardiovascular: Negative.     Per HPI unless specifically indicated above     Objective:    BP 109/72 mmHg  Pulse 71  Temp(Src) 98.1 F (36.7 C)  Ht 5' 6.7" (1.694 m)  Wt 202 lb (91.627 kg)  BMI 31.93 kg/m2  SpO2 97%  Wt Readings from Last 3 Encounters:  09/21/15 202 lb (91.627 kg)  06/09/15 189 lb (85.73 kg)  03/26/15 193 lb (87.544 kg)    Physical Exam  Constitutional: She is oriented to person, place, and time. She appears well-developed and well-nourished. No distress.  HENT:  Head: Normocephalic and atraumatic.  Right Ear: Hearing normal.  Left Ear: Hearing normal.  Nose: Nose normal.  Eyes: Conjunctivae and lids are normal. Right eye exhibits no discharge. Left eye exhibits no discharge. No scleral icterus.  Cardiovascular: Normal rate, regular rhythm and normal heart sounds.   Pulmonary/Chest: Effort normal and breath sounds normal. No respiratory distress.  Musculoskeletal: Normal range of motion.  Neurological: She is  alert and oriented to person, place, and time.  Skin: Skin is intact. No rash noted.  Psychiatric: She has a normal mood and affect. Her speech is normal and behavior is normal. Judgment and thought content normal. Cognition and memory are normal.    Results for orders placed or performed in visit on 06/09/15  Bayer DCA Hb A1c Waived  Result Value Ref Range   Bayer DCA Hb A1c Waived 5.9 <7.0 %      Assessment & Plan:   Problem List Items Addressed This Visit      Cardiovascular and Mediastinum   Hypertension    The current medical regimen is effective;  continue present plan and medications.       Relevant Orders   Bayer DCA Hb A1c Waived   LP+ALT+AST Piccolo, Waived   Basic metabolic panel     Endocrine   Diabetes [facility])    The current medical regimen is effective;  continue present plan and medications.       Relevant Orders   Bayer DCA Hb A1c Waived   LP+ALT+AST Piccolo, Waived   Basic metabolic panel     Other   Tobacco abuse    Has already been working on cutting back on amount of cigarettes, will start Chantix. Risks and side effects discussed. Patient ready to quit and very motivated.       Relevant Medications   varenicline (CHANTIX CONTINUING MONTH PAK) 1 MG tablet   varenicline (CHANTIX  STARTING MONTH PAK) 0.5 MG X 11 & 1 MG X 42 tablet   Hyperlipidemia - Primary    Patient will work on improving diet and exercise, and will begin taking lipitor in the mornings with other meds to help her remember to take it.      Relevant Orders   Bayer DCA Hb A1c Waived   LP+ALT+AST Piccolo, Waived   Basic metabolic panel       Follow up plan: Return in about 3 months (around 12/22/2015) for Physical Exam a1c.

## 2015-09-21 NOTE — Assessment & Plan Note (Signed)
Patient will work on improving diet and exercise, and will begin taking lipitor in the mornings with other meds to help her remember to take it.

## 2015-09-22 ENCOUNTER — Encounter: Payer: Self-pay | Admitting: Family Medicine

## 2015-09-22 LAB — BASIC METABOLIC PANEL
BUN / CREAT RATIO: 18 (ref 9–23)
BUN: 19 mg/dL (ref 6–24)
CO2: 19 mmol/L (ref 18–29)
CREATININE: 1.03 mg/dL — AB (ref 0.57–1.00)
Calcium: 9.3 mg/dL (ref 8.7–10.2)
Chloride: 101 mmol/L (ref 96–106)
GFR calc Af Amer: 70 mL/min/{1.73_m2} (ref >59)
GFR, EST NON AFRICAN AMERICAN: 60 mL/min/{1.73_m2} (ref >59)
GLUCOSE: 100 mg/dL — AB (ref 65–99)
POTASSIUM: 4.7 mmol/L (ref 3.5–5.2)
SODIUM: 139 mmol/L (ref 134–144)

## 2015-10-01 ENCOUNTER — Telehealth: Payer: Self-pay | Admitting: Family Medicine

## 2015-10-01 NOTE — Telephone Encounter (Signed)
Pt called asking for "something to be called in" I explained Dr Jeananne Rama was out of the office until the end of July, pt declined to make appt with a different provider.

## 2015-10-28 ENCOUNTER — Other Ambulatory Visit: Payer: Self-pay | Admitting: Family Medicine

## 2015-10-28 ENCOUNTER — Telehealth: Payer: Self-pay | Admitting: Family Medicine

## 2015-10-28 DIAGNOSIS — L03119 Cellulitis of unspecified part of limb: Principal | ICD-10-CM

## 2015-10-28 DIAGNOSIS — L02619 Cutaneous abscess of unspecified foot: Secondary | ICD-10-CM

## 2015-10-28 MED ORDER — MUPIROCIN 2 % EX OINT: TOPICAL_OINTMENT | Freq: Two times a day (BID) | CUTANEOUS | Status: DC

## 2015-10-28 NOTE — Telephone Encounter (Signed)
Phone call Discussed with patient had trauma to toe with toenail knocked off now toes using yellow material and slightly red will call him some Bactroban ointment If not getting better will need to see.

## 2015-10-28 NOTE — Telephone Encounter (Signed)
Returned patient's call, no answer, left VM

## 2015-10-29 ENCOUNTER — Telehealth: Payer: Self-pay

## 2015-10-29 MED ORDER — MUPIROCIN 2 % EX OINT: 1.0000 "application " | TOPICAL_OINTMENT | Freq: Two times a day (BID) | CUTANEOUS | 0 refills | Status: DC

## 2015-10-29 NOTE — Telephone Encounter (Signed)
Pharmacy states patient is there looking for Rx,  I believe you were going to send in [facility]

## 2015-10-29 NOTE — Telephone Encounter (Signed)
Call pt 

## 2015-10-29 NOTE — Telephone Encounter (Signed)
Pt called and stated that she has left but would like a call back once it has been called in. Thanks

## 2015-12-08 DIAGNOSIS — M4806 Spinal stenosis, lumbar region: Secondary | ICD-10-CM | POA: Diagnosis not present

## 2016-01-04 ENCOUNTER — Encounter (INDEPENDENT_AMBULATORY_CARE_PROVIDER_SITE_OTHER): Payer: Self-pay

## 2016-01-13 ENCOUNTER — Other Ambulatory Visit: Payer: Self-pay | Admitting: Family Medicine

## 2016-01-13 NOTE — Telephone Encounter (Signed)
1 year worth of medicine sent to pharmacy last month.

## 2016-02-09 ENCOUNTER — Ambulatory Visit (INDEPENDENT_AMBULATORY_CARE_PROVIDER_SITE_OTHER): Payer: Medicare Other | Admitting: Family Medicine

## 2016-02-09 ENCOUNTER — Encounter: Payer: Self-pay | Admitting: Family Medicine

## 2016-02-09 VITALS — BP 136/82 | HR 73 | Temp 98.2°F | Ht 67.25 in | Wt 211.0 lb

## 2016-02-09 DIAGNOSIS — M4326 Fusion of spine, lumbar region: Secondary | ICD-10-CM

## 2016-02-09 DIAGNOSIS — T847XXD Infection and inflammatory reaction due to other internal orthopedic prosthetic devices, implants and grafts, subsequent encounter: Secondary | ICD-10-CM

## 2016-02-09 DIAGNOSIS — E782 Mixed hyperlipidemia: Secondary | ICD-10-CM

## 2016-02-09 DIAGNOSIS — Z0000 Encounter for general adult medical examination without abnormal findings: Secondary | ICD-10-CM

## 2016-02-09 DIAGNOSIS — I10 Essential (primary) hypertension: Secondary | ICD-10-CM

## 2016-02-09 DIAGNOSIS — E7800 Pure hypercholesterolemia, unspecified: Secondary | ICD-10-CM

## 2016-02-09 DIAGNOSIS — Z23 Encounter for immunization: Secondary | ICD-10-CM

## 2016-02-09 DIAGNOSIS — E119 Type 2 diabetes mellitus without complications: Secondary | ICD-10-CM

## 2016-02-09 DIAGNOSIS — K219 Gastro-esophageal reflux disease without esophagitis: Secondary | ICD-10-CM

## 2016-02-09 DIAGNOSIS — J411 Mucopurulent chronic bronchitis: Secondary | ICD-10-CM

## 2016-02-09 DIAGNOSIS — Z1231 Encounter for screening mammogram for malignant neoplasm of breast: Secondary | ICD-10-CM

## 2016-02-09 LAB — URINALYSIS, ROUTINE W REFLEX MICROSCOPIC
Bilirubin, UA: NEGATIVE
GLUCOSE, UA: NEGATIVE
Ketones, UA: NEGATIVE
Nitrite, UA: NEGATIVE
Specific Gravity, UA: 1.015 (ref 1.005–1.030)
UUROB: 0.2 mg/dL (ref 0.2–1.0)
pH, UA: 6 (ref 5.0–7.5)

## 2016-02-09 LAB — MICROALBUMIN, URINE WAIVED
Creatinine, Urine Waived: 200 mg/dL (ref 10–300)
Microalb, Ur Waived: 30 mg/L — ABNORMAL HIGH (ref 0–19)
Microalb/Creat Ratio: <30 mg/g (ref <30)

## 2016-02-09 LAB — BAYER DCA HB A1C WAIVED: HB A1C (BAYER DCA - WAIVED): 5.8 % (ref <7.0)

## 2016-02-09 LAB — MICROSCOPIC EXAMINATION

## 2016-02-09 MED ORDER — MELOXICAM 15 MG PO TABS: 15.0000 mg | ORAL_TABLET | Freq: Every day | ORAL | 4 refills | Status: DC

## 2016-02-09 MED ORDER — OMEPRAZOLE 20 MG PO CPDR: 20.0000 mg | DELAYED_RELEASE_CAPSULE | Freq: Every day | ORAL | 4 refills | Status: DC

## 2016-02-09 MED ORDER — PIOGLITAZONE HCL 30 MG PO TABS: 30.0000 mg | ORAL_TABLET | Freq: Every day | ORAL | 4 refills | Status: DC

## 2016-02-09 MED ORDER — METFORMIN HCL 500 MG PO TABS: 1000.0000 mg | ORAL_TABLET | Freq: Two times a day (BID) | ORAL | 4 refills | Status: DC

## 2016-02-09 MED ORDER — LISINOPRIL 10 MG PO TABS: 10.0000 mg | ORAL_TABLET | Freq: Every day | ORAL | 4 refills | Status: DC

## 2016-02-09 MED ORDER — ATORVASTATIN CALCIUM 20 MG PO TABS: 20.0000 mg | ORAL_TABLET | Freq: Every day | ORAL | 4 refills | Status: DC

## 2016-02-09 MED ORDER — SULFAMETHOXAZOLE-TRIMETHOPRIM 800-160 MG PO TABS: 1.0000 | ORAL_TABLET | Freq: Two times a day (BID) | ORAL | 4 refills | Status: DC

## 2016-02-09 NOTE — Assessment & Plan Note (Signed)
Stable on medications

## 2016-02-09 NOTE — Assessment & Plan Note (Signed)
The current medical regimen is effective;  continue present plan and medications.  

## 2016-02-09 NOTE — Assessment & Plan Note (Signed)
Chronic pain of arthritis stable

## 2016-02-09 NOTE — Assessment & Plan Note (Signed)
Discussed patient's diabetes doing well off Invokanna discussed better diet exercise nutrition will stay off Invokanna this patient's A1c of 5.8.

## 2016-02-09 NOTE — Progress Notes (Addendum)
BP 136/82 (BP Location: Left Arm)   Pulse 73   Temp 98.2 F (36.8 C)   Ht 5' 7.25" (1.708 m)   Wt 211 lb (95.7 kg)   SpO2 98%   BMI 32.80 kg/m    Subjective:    Patient ID: Stacey Gross, female    DOB: 1960/10/18, 55 y.o.   MRN: [id number]  HPI: Stacey Gross is a 55 y.o. female  Chief Complaint  Patient presents with  . Annual Exam  . Diabetes    she states she has been out of her Invokana for a month, was unable to get refills. She will call and schedule her Dm eye exam.   Patient with complaints of fatigue and poor sleep. On typical day review patient with limited nutrition except for drinks a lot of sweet tea and coffee with cream. Drinks coffee all day long into the evening and at night may even take cup of coffee to bed with her. (Then she wonders why she can't sleep) Patient gaining weight with very poor nutrition mostly sugar and cream.  Patient is been trying to get Invokanna refilled and due to some breakdown in our office systems has not been refilled and patient hasn't been taking for over a month. Patient hasn't noticed any change in her diabetes status. Patient still smoking readings doing okay though On back arthritis stuff constant pain Patient doing okay with Septra daily for suppression of osteomyelitis  Relevant past medical, surgical, family and social history reviewed and updated as indicated. Interim medical history since our last visit reviewed. Allergies and medications reviewed and updated.  Review of Systems  Constitutional: Negative.   HENT: Negative.   Eyes: Negative.   Respiratory: Negative.   Cardiovascular: Negative.   Gastrointestinal: Negative.   Endocrine: Negative.   Genitourinary: Negative.   Musculoskeletal: Negative.   Skin: Negative.   Allergic/Immunologic: Negative.   Neurological: Negative.   Hematological: Negative.   Psychiatric/Behavioral: Negative.     Per HPI unless specifically indicated above     Objective:    BP 136/82 (BP Location: Left Arm)   Pulse 73   Temp 98.2 F (36.8 C)   Ht 5' 7.25" (1.708 m)   Wt 211 lb (95.7 kg)   SpO2 98%   BMI 32.80 kg/m   Wt Readings from Last 3 Encounters:  02/09/16 211 lb (95.7 kg)  09/21/15 202 lb (91.6 kg)  06/09/15 189 lb (85.7 kg)    Physical Exam  Constitutional: She is oriented to person, place, and time. She appears well-developed and well-nourished.  HENT:  Head: Normocephalic and atraumatic.  Right Ear: External ear normal.  Left Ear: External ear normal.  Nose: Nose normal.  Mouth/Throat: Oropharynx is clear and moist.  Eyes: Conjunctivae and EOM are normal. Pupils are equal, round, and reactive to light.  Neck: Normal range of motion. Neck supple. Carotid bruit is not present.  Cardiovascular: Normal rate, regular rhythm and normal heart sounds.   No murmur heard. Pulmonary/Chest: Effort normal. She has wheezes. She has no rales. She exhibits no mass. Right breast exhibits no mass, no skin change and no tenderness. Left breast exhibits no mass, no skin change and no tenderness. Breasts are symmetrical.  Abdominal: Soft. Bowel sounds are normal. There is no hepatosplenomegaly.  Musculoskeletal: Normal range of motion.  Neurological: She is alert and oriented to person, place, and time.  Skin: No rash noted.  Psychiatric: She has a normal mood and affect. Her behavior is normal.  Judgment and thought content normal.    Results for orders placed or performed in visit on 09/21/15  Bayer DCA Hb A1c Waived  Result Value Ref Range   Bayer DCA Hb A1c Waived 5.7 <7.0 %  LP+ALT+AST Piccolo, Waived  Result Value Ref Range   ALT (SGPT) Piccolo, Waived 14 10 - 47 U/L   AST (SGOT) Piccolo, Waived 34 11 - 38 U/L   Cholesterol Piccolo, Waived 274 (H) <200 mg/dL   HDL Chol Piccolo, Waived 42 (L) >59 mg/dL   Triglycerides Piccolo,Waived 301 (H) <150 mg/dL   Chol/HDL Ratio Piccolo,Waive 6.5 (H) mg/dL   LDL Chol Calc Piccolo Waived 171 (H) <100 mg/dL     VLDL Chol Calc Piccolo,Waive 60 (H) <30 mg/dL  Basic metabolic panel  Result Value Ref Range   Glucose 100 (H) 65 - 99 mg/dL   BUN 19 6 - 24 mg/dL   Creatinine, Ser 1.03 (H) 0.57 - 1.00 mg/dL   GFR calc non Af Amer 60 >59 mL/min/1.73   GFR calc Af Amer 70 >59 mL/min/1.73   BUN/Creatinine Ratio 18 9 - 23   Sodium 139 134 - 144 mmol/L   Potassium 4.7 3.5 - 5.2 mmol/L   Chloride 101 96 - 106 mmol/L   CO2 19 18 - 29 mmol/L   Calcium 9.3 8.7 - 10.2 mg/dL      Assessment & Plan:   Problem List Items Addressed This Visit      Cardiovascular and Mediastinum   Hypertension    The current medical regimen is effective;  continue present plan and medications.       Relevant Medications   atorvastatin (LIPITOR) 20 MG tablet   lisinopril (PRINIVIL,ZESTRIL) 10 MG tablet   Other Relevant Orders   CBC with Differential/Platelet   Comprehensive metabolic panel     Respiratory   COPD (chronic obstructive pulmonary disease) ([place])    The current medical regimen is effective;  continue present plan and medications.         Digestive   Acid reflux   Relevant Medications   omeprazole (PRILOSEC) 20 MG capsule     Endocrine   Diabetes ([place])    Discussed patient's diabetes doing well off Invokanna discussed better diet exercise nutrition will stay off Invokanna this patient's A1c of 5.8.      Relevant Medications   atorvastatin (LIPITOR) 20 MG tablet   lisinopril (PRINIVIL,ZESTRIL) 10 MG tablet   metFORMIN (GLUCOPHAGE) 500 MG tablet   pioglitazone (ACTOS) 30 MG tablet   Other Relevant Orders   Urinalysis, Routine w reflex microscopic (not at [facility])   Bayer DCA Hb A1c Waived (STAT)   Microalbumin, Urine Waived   Pneumococcal polysaccharide vaccine 23-valent greater than or equal to 2yo subcutaneous/IM (Completed)     Musculoskeletal and Integument   Fusion of spine of lumbar region    Chronic pain of arthritis stable      Wound infection complicating hardware (HCC)    Stable  on medications      Relevant Medications   sulfamethoxazole-trimethoprim (BACTRIM DS,SEPTRA DS) 800-160 MG tablet     Other   Hyperlipidemia - Primary    The current medical regimen is effective;  continue present plan and medications.       Relevant Medications   atorvastatin (LIPITOR) 20 MG tablet   lisinopril (PRINIVIL,ZESTRIL) 10 MG tablet   Other Relevant Orders   Lipid Profile    Other Visit Diagnoses    PE (physical exam), annual  Relevant Orders   CBC with Differential/Platelet   Comprehensive metabolic panel   Lipid Profile   Urinalysis, Routine w reflex microscopic (not at [facility])   TSH   Bayer DCA Hb A1c Waived (STAT)   Microalbumin, Urine Waived   Needs flu shot       Screening mammogram, encounter for       Relevant Orders   MM Digital Screening   Need for pneumococcal vaccination       Relevant Orders   Pneumococcal polysaccharide vaccine 23-valent greater than or equal to 2yo subcutaneous/IM (Completed)   Encounter for immunization       Relevant Orders   Flu Vaccine QUAD 36+ mos IM (Completed)   Infection/inflammation due to internal orthopedic device/implant/graft, subsequent encounter       Relevant Medications   meloxicam (MOBIC) 15 MG tablet   sulfamethoxazole-trimethoprim (BACTRIM DS,SEPTRA DS) 800-160 MG tablet     Discussed lifestyle and sleep disorder also discussed malnutrition and better sleep and eating. Cutting back on sugar and cream   Follow up plan: Return in about 3 months (around 05/11/2016) for Hemoglobin A1c.

## 2016-02-10 ENCOUNTER — Encounter: Payer: Self-pay | Admitting: Family Medicine

## 2016-02-10 LAB — CBC WITH DIFFERENTIAL/PLATELET
BASOS ABS: 0.1 10*3/uL (ref 0.0–0.2)
BASOS: 1 %
EOS (ABSOLUTE): 0.1 10*3/uL (ref 0.0–0.4)
Eos: 2 %
HEMATOCRIT: 41 % (ref 34.0–46.6)
Hemoglobin: 13.4 g/dL (ref 11.1–15.9)
IMMATURE GRANULOCYTES: 0 %
Immature Grans (Abs): 0 10*3/uL (ref 0.0–0.1)
Lymphocytes Absolute: 2.5 10*3/uL (ref 0.7–3.1)
Lymphs: 37 %
MCH: 29.8 pg (ref 26.6–33.0)
MCHC: 32.7 g/dL (ref 31.5–35.7)
MCV: 91 fL (ref 79–97)
MONOS ABS: 0.5 10*3/uL (ref 0.1–0.9)
Monocytes: 7 %
NEUTROS PCT: 53 %
Neutrophils Absolute: 3.6 10*3/uL (ref 1.4–7.0)
Platelets: 300 10*3/uL (ref 150–379)
RBC: 4.5 x10E6/uL (ref 3.77–5.28)
RDW: 13.6 % (ref 12.3–15.4)
WBC: 6.8 10*3/uL (ref 3.4–10.8)

## 2016-02-10 LAB — TSH: TSH: 0.899 u[IU]/mL (ref 0.450–4.500)

## 2016-02-10 LAB — COMPREHENSIVE METABOLIC PANEL
A/G RATIO: 1.8 (ref 1.2–2.2)
ALK PHOS: 88 IU/L (ref 39–117)
ALT: 16 IU/L (ref 0–32)
AST: 18 IU/L (ref 0–40)
Albumin: 4.2 g/dL (ref 3.5–5.5)
BUN/Creatinine Ratio: 17 (ref 9–23)
BUN: 18 mg/dL (ref 6–24)
Bilirubin Total: 0.4 mg/dL (ref 0.0–1.2)
CO2: 25 mmol/L (ref 18–29)
CREATININE: 1.03 mg/dL — AB (ref 0.57–1.00)
Calcium: 9.5 mg/dL (ref 8.7–10.2)
Chloride: 101 mmol/L (ref 96–106)
GFR calc Af Amer: 70 mL/min/{1.73_m2} (ref >59)
GFR calc non Af Amer: 60 mL/min/{1.73_m2} (ref >59)
GLOBULIN, TOTAL: 2.3 g/dL (ref 1.5–4.5)
Glucose: 99 mg/dL (ref 65–99)
POTASSIUM: 4.9 mmol/L (ref 3.5–5.2)
SODIUM: 140 mmol/L (ref 134–144)
Total Protein: 6.5 g/dL (ref 6.0–8.5)

## 2016-02-10 LAB — LIPID PANEL
CHOLESTEROL TOTAL: 198 mg/dL (ref 100–199)
Chol/HDL Ratio: 5 ratio units — ABNORMAL HIGH (ref 0.0–4.4)
HDL: 40 mg/dL (ref >39)
LDL Calculated: 116 mg/dL — ABNORMAL HIGH (ref 0–99)
TRIGLYCERIDES: 209 mg/dL — AB (ref 0–149)
VLDL Cholesterol Cal: 42 mg/dL — ABNORMAL HIGH (ref 5–40)

## 2016-02-29 ENCOUNTER — Encounter: Payer: Self-pay | Admitting: Family Medicine

## 2016-02-29 ENCOUNTER — Ambulatory Visit (INDEPENDENT_AMBULATORY_CARE_PROVIDER_SITE_OTHER): Payer: Medicare Other | Admitting: Family Medicine

## 2016-02-29 VITALS — BP 112/79 | HR 74 | Temp 98.5°F | Wt 207.0 lb

## 2016-02-29 DIAGNOSIS — I739 Peripheral vascular disease, unspecified: Secondary | ICD-10-CM

## 2016-02-29 MED ORDER — TRAMADOL HCL 50 MG PO TABS: 50.0000 mg | ORAL_TABLET | Freq: Three times a day (TID) | ORAL | 0 refills | Status: DC | PRN

## 2016-02-29 NOTE — Progress Notes (Signed)
Be sure when I check a patient with you to put it somewhere in the notes

## 2016-02-29 NOTE — Patient Instructions (Signed)
Follow up as needed. Await vascular appt scheduling with referral coordinator

## 2016-02-29 NOTE — Progress Notes (Addendum)
   BP 112/79   Pulse 74   Temp 98.5 F (36.9 C)   Wt 207 lb (93.9 kg)   SpO2 97%   BMI 32.18 kg/m    Subjective:    Patient ID: Stacey Gross, female    DOB: [birth year], 54 y.o.   MRN: [id number]  HPI: Stacey Gross is a 55 y.o. female  Chief Complaint  Patient presents with  . Toe swelling    x 10 days. bilateral feet, started in left. Toes swell and then they turn black and then she soaks them and color returns. Painful, feet stay cold as ice.    Patient presents with about 10 days of worsening pain, swelling, and discoloration of distal toes. Left foot seems worse than right foot. States her feet stay freezing cold no matter what she does. Warm epsom salt soaks seem to help temporarily. Hx of diabetes, claudication, and smoking. On chronic bactrim for osteomyelitis suppression. No LE wounds, fevers, chills.  Not yet ready to quit smoking.  Relevant past medical, surgical, family and social history reviewed and updated as indicated. Interim medical history since our last visit reviewed. Allergies and medications reviewed and updated.  Review of Systems  Constitutional: Negative.   HENT: Negative.   Respiratory: Negative.   Cardiovascular: Negative.   Gastrointestinal: Negative.   Genitourinary: Negative.   Musculoskeletal: Positive for arthralgias, gait problem and joint swelling.  Skin: Positive for color change.  Psychiatric/Behavioral: Negative.     Per HPI unless specifically indicated above     Objective:    BP 112/79   Pulse 74   Temp 98.5 F (36.9 C)   Wt 207 lb (93.9 kg)   SpO2 97%   BMI 32.18 kg/m   Wt Readings from Last 3 Encounters:  02/29/16 207 lb (93.9 kg)  02/09/16 211 lb (95.7 kg)  09/21/15 202 lb (91.6 kg)    Physical Exam  Constitutional: She is oriented to person, place, and time. She appears well-developed and well-nourished. No distress.  HENT:  Head: Atraumatic.  Eyes: Conjunctivae are normal. No scleral icterus.  Neck: Normal  range of motion. Neck supple.  Cardiovascular: Normal rate.   Minimal posterior tibial pulse palpated b/l   Pulmonary/Chest: Effort normal. No respiratory distress.  Musculoskeletal: Normal range of motion.  Neurological: She is alert and oriented to person, place, and time.  Skin: Skin is warm and dry.  Psychiatric: She has a normal mood and affect. Her behavior is normal.  Nursing note and vitals reviewed.     Assessment & Plan:   Problem List Items Addressed This Visit    None    Visit Diagnoses    PAD (peripheral artery disease) ([place])    -  Primary   Referral to vascular surgery placed. Leg elevation, warm epsom salt soaks. Discussed smoking cessation importance.    Relevant Orders   Ambulatory referral to Vascular Surgery    Case discussed with Dr. Jeananne Rama, who also examined the patient.    Follow up plan: Return if symptoms worsen or fail to improve.

## 2016-03-01 ENCOUNTER — Ambulatory Visit: Payer: Medicare Other | Admitting: Family Medicine

## 2016-03-04 ENCOUNTER — Encounter (INDEPENDENT_AMBULATORY_CARE_PROVIDER_SITE_OTHER): Payer: Self-pay | Admitting: Vascular Surgery

## 2016-03-04 ENCOUNTER — Ambulatory Visit (INDEPENDENT_AMBULATORY_CARE_PROVIDER_SITE_OTHER): Payer: Medicare Other | Admitting: Vascular Surgery

## 2016-03-04 ENCOUNTER — Encounter (INDEPENDENT_AMBULATORY_CARE_PROVIDER_SITE_OTHER): Payer: Self-pay

## 2016-03-04 VITALS — BP 134/85 | HR 68 | Resp 16 | Ht 67.0 in | Wt 209.6 lb

## 2016-03-04 DIAGNOSIS — I75029 Atheroembolism of unspecified lower extremity: Secondary | ICD-10-CM | POA: Insufficient documentation

## 2016-03-04 DIAGNOSIS — E119 Type 2 diabetes mellitus without complications: Secondary | ICD-10-CM | POA: Diagnosis not present

## 2016-03-04 DIAGNOSIS — I70213 Atherosclerosis of native arteries of extremities with intermittent claudication, bilateral legs: Secondary | ICD-10-CM

## 2016-03-04 DIAGNOSIS — E782 Mixed hyperlipidemia: Secondary | ICD-10-CM

## 2016-03-04 DIAGNOSIS — I70219 Atherosclerosis of native arteries of extremities with intermittent claudication, unspecified extremity: Secondary | ICD-10-CM | POA: Insufficient documentation

## 2016-03-04 DIAGNOSIS — F17210 Nicotine dependence, cigarettes, uncomplicated: Secondary | ICD-10-CM

## 2016-03-04 DIAGNOSIS — Z720 Tobacco use: Secondary | ICD-10-CM

## 2016-03-04 DIAGNOSIS — I75022 Atheroembolism of left lower extremity: Secondary | ICD-10-CM

## 2016-03-04 NOTE — Patient Instructions (Signed)

## 2016-03-04 NOTE — Assessment & Plan Note (Signed)
The patient describes symptoms consistent with claudication of both lower extremities which is long-standing. 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 We had a long discussion regarding the pathophysiology and natural history of both peripheral arterial disease as well as blue toe syndrome. She reports severe pain. I have offered her noninvasive studies for full other evaluation, but she would like to proceed as soon as possible to angiography. Given her strong clinical history and exam I think that is certainly reasonable. I have added Plavix to her regimen today. She will continue taking her Lipitor. Smoking cessation is strongly recommended.

## 2016-03-04 NOTE — Progress Notes (Signed)
Patient ID: Stacey Gross, female   DOB: 07-20-1960, 56 y.o.   MRN: [id number]  Chief Complaint  Patient presents with  . New Patient (Initial Visit)    HPI Stacey Gross is a 55 y.o. female.  I am asked to see the patient by Dr. Jeananne Rama and his PA Ms. Lane for evaluation of peripheral artery disease with painful discolored toes.  The patient reports Pain and cramping in her legs with walking. This predominantly affects the calves and thighs and is a little worse on the left leg on the right leg. This has been going on for over a year without a clear inciting event or causative factor. This had been gradually progressive. A couple of weeks ago, she developed dark discoloration of all the toes on the left foot as well as a lesser degree of discoloration of the toes on the right foot. These were exquisitely painful. Her left great toe and third toe remained discolored with the third toe being the worst. She says the color has gradually improved. She has been soaking these in Epsom salts daily. She does not have open ulceration or infection. She has multiple atherosclerotic risk factors including ongoing tobacco use, hyperlipidemia, and diabetes.   Past Medical History:  Diagnosis Date  . Chronic back pain   . Degenerative joint disease (DJD) of hip   . GERD (gastroesophageal reflux disease)    takes Omeprazole daily  . History of bronchitis yrs ago  . History of colon polyps    benign  . History of staph infection 10 yrs ago  . Hyperlipidemia    takes Atorvastatin daily  . Weakness    numbness and tingling in both legs     Past Surgical History:  Procedure Laterality Date  . ABDOMINAL HYSTERECTOMY     total  . APPENDECTOMY    . BACK SURGERY  9-10 yrs ago  . COLONOSCOPY    . HERNIA REPAIR      Family History  Problem Relation Age of Onset  . Hyperlipidemia Mother   . Hyperlipidemia Father   . Cancer Sister     melanoma  No known history of bleeding disorders or  clotting disorders.  Familiar with peripheral arterial disease as her husband has had a treated many times  Social History Social History  Substance Use Topics  . Smoking status: Current Every Day Smoker    Packs/day: 0.50    Years: 43.00    Types: Cigarettes  . Smokeless tobacco: Never Used  . Alcohol use No  No IV drug use  Allergies  Allergen Reactions  . Doxycycline Hyclate Anaphylaxis, Swelling and Rash  . Latex Rash  . Vancomycin Rash    Current Outpatient Prescriptions  Medication Sig Dispense Refill  . atorvastatin (LIPITOR) 20 MG tablet Take 1 tablet (20 mg total) by mouth at bedtime. 90 tablet 4  . Blood Glucose Monitoring Suppl (ONE TOUCH ULTRA SYSTEM KIT) W/DEVICE KIT 1 kit by Does not apply route once. 100 each 12  . glucose blood test strip Use as instructed 100 each 12  . lisinopril (PRINIVIL,ZESTRIL) 10 MG tablet Take 1 tablet (10 mg total) by mouth daily. 90 tablet 4  . meloxicam (MOBIC) 15 MG tablet Take 1 tablet (15 mg total) by mouth daily. 90 tablet 4  . metFORMIN (GLUCOPHAGE) 500 MG tablet Take 2 tablets (1,000 mg total) by mouth 2 (two) times daily. 180 tablet 4  . omeprazole (PRILOSEC) 20 MG capsule Take 1 capsule (  20 mg total) by mouth daily. 90 capsule 4  . pioglitazone (ACTOS) 30 MG tablet Take 1 tablet (30 mg total) by mouth daily. 90 tablet 4  . sulfamethoxazole-trimethoprim (BACTRIM DS,SEPTRA DS) 800-160 MG tablet Take 1 tablet by mouth 2 (two) times daily. 180 tablet 4  . traMADol (ULTRAM) 50 MG tablet Take 1 tablet (50 mg total) by mouth every 8 (eight) hours as needed. 30 tablet 0   No current facility-administered medications for this visit.       REVIEW OF SYSTEMS (Negative unless checked)  Constitutional: [] Weight loss  [] Fever  [] Chills Cardiac: [] Chest pain   [] Chest pressure   [] Palpitations   [] Shortness of breath when laying flat   [] Shortness of breath at rest   [] Shortness of breath with exertion. Vascular:  [x] Pain in legs with  walking   [] Pain in legs at rest   [] Pain in legs when laying flat   [x] Claudication   [] Pain in feet when walking  [x] Pain in feet at rest  [] Pain in feet when laying flat   [] History of DVT   [] Phlebitis   [] Swelling in legs   [] Varicose veins   [] Non-healing ulcers Pulmonary:   [] Uses home oxygen   [] Productive cough   [] Hemoptysis   [] Wheeze  [] COPD   [] Asthma Neurologic:  [] Dizziness  [] Blackouts   [] Seizures   [] History of stroke   [] History of TIA  [] Aphasia   [] Temporary blindness   [] Dysphagia   [] Weakness or numbness in arms   [] Weakness or numbness in legs Musculoskeletal:  [] Arthritis   [] Joint swelling   [] Joint pain   [x] Low back pain Hematologic:  [] Easy bruising  [] Easy bleeding   [] Hypercoagulable state   [] Anemic  [] Hepatitis Gastrointestinal:  [] Blood in stool   [] Vomiting blood  [] Gastroesophageal reflux/heartburn   [] Abdominal pain Genitourinary:  [] Chronic kidney disease   [] Difficult urination  [] Frequent urination  [] Burning with urination   [] Hematuria Skin:  [] Rashes   [] Ulcers   [] Wounds Psychological:  [] History of anxiety   []  History of major depression.    Physical Exam BP 134/85   Pulse 68   Resp 16   Ht 5' 7"  (1.702 m)   Wt 95.1 kg (209 lb 9.6 oz)   BMI 32.83 kg/m  Gen:  WD/WN, NAD. Appears older than stated age Head: /AT, No temporalis wasting. Prominent temp pulse not noted. Ear/Nose/Throat: Hearing grossly intact, nares w/o erythema or drainage, oropharynx w/o Erythema/Exudate Eyes: Conjunctiva clear, sclera non-icteric  Neck: trachea midline.  No JVD.  Pulmonary:  Good air movement, respirations nonlabored, no use of accessory muscles  Cardiac: RRR, normal S1, S2 Vascular:  Vessel Right Left  Radial Palpable Palpable  Ulnar Palpable Palpable  Brachial Palpable Palpable  Carotid Palpable, without bruit Palpable, without bruit  Aorta Not palpable N/A  Femoral Palpable Palpable  Popliteal Palpable Trace Palpable  PT 1+ Palpable Not Palpable  DP  1+ Palpable 1+ Palpable   Gastrointestinal: soft, non-tender/non-distended. No guarding/reflex. No masses, surgical incisions, or scars. Musculoskeletal: M/S 5/5 throughout.  Cyanosis is present in the left great toe and left third toe most notably in the third toe. The remainder of the toes on both feet have pretty good capillary refill at this point. Neurologic: Sensation grossly intact in extremities.  Symmetrical.  Speech is fluent. Motor exam as listed above. Psychiatric: Judgment intact, Mood & affect appropriate for pt's clinical situation. Dermatologic: No rashes or ulcers noted.  No cellulitis or open wounds. Lymph : No Cervical, Axillary, or  Inguinal lymphadenopathy.   Radiology No results found.  Labs Recent Results (from the past 2160 hour(s))  CBC with Differential/Platelet     Status: None   Collection Time: 02/09/16  8:57 AM  Result Value Ref Range   WBC 6.8 3.4 - 10.8 x10E3/uL   RBC 4.50 3.77 - 5.28 x10E6/uL   Hemoglobin 13.4 11.1 - 15.9 g/dL   Hematocrit 41.0 34.0 - 46.6 %   MCV 91 79 - 97 fL   MCH 29.8 26.6 - 33.0 pg   MCHC 32.7 31.5 - 35.7 g/dL   RDW 13.6 12.3 - 15.4 %   Platelets 300 150 - 379 x10E3/uL   Neutrophils 53 Not Estab. %    Comment: Occasional metamyelocyte seen on scan.   Lymphs 37 Not Estab. %   Monocytes 7 Not Estab. %   Eos 2 Not Estab. %   Basos 1 Not Estab. %   Neutrophils Absolute 3.6 1.4 - 7.0 x10E3/uL   Lymphocytes Absolute 2.5 0.7 - 3.1 x10E3/uL   Monocytes Absolute 0.5 0.1 - 0.9 x10E3/uL   EOS (ABSOLUTE) 0.1 0.0 - 0.4 x10E3/uL   Basophils Absolute 0.1 0.0 - 0.2 x10E3/uL   Immature Granulocytes 0 Not Estab. %   Immature Grans (Abs) 0.0 0.0 - 0.1 x10E3/uL   Hematology Comments: Note:     Comment: Verified by microscopic examination.  Comprehensive metabolic panel     Status: Abnormal   Collection Time: 02/09/16  8:57 AM  Result Value Ref Range   Glucose 99 65 - 99 mg/dL   BUN 18 6 - 24 mg/dL   Creatinine, Ser 1.03 (H) 0.57 - 1.00  mg/dL   GFR calc non Af Amer 60 >59 mL/min/1.73   GFR calc Af Amer 70 >59 mL/min/1.73   BUN/Creatinine Ratio 17 9 - 23   Sodium 140 134 - 144 mmol/L   Potassium 4.9 3.5 - 5.2 mmol/L   Chloride 101 96 - 106 mmol/L   CO2 25 18 - 29 mmol/L   Calcium 9.5 8.7 - 10.2 mg/dL   Total Protein 6.5 6.0 - 8.5 g/dL   Albumin 4.2 3.5 - 5.5 g/dL   Globulin, Total 2.3 1.5 - 4.5 g/dL   Albumin/Globulin Ratio 1.8 1.2 - 2.2   Bilirubin Total 0.4 0.0 - 1.2 mg/dL   Alkaline Phosphatase 88 39 - 117 IU/L   AST 18 0 - 40 IU/L   ALT 16 0 - 32 IU/L  Lipid Profile     Status: Abnormal   Collection Time: 02/09/16  8:57 AM  Result Value Ref Range   Cholesterol, Total 198 100 - 199 mg/dL   Triglycerides 209 (H) 0 - 149 mg/dL   HDL 40 >39 mg/dL   VLDL Cholesterol Cal 42 (H) 5 - 40 mg/dL   LDL Calculated 116 (H) 0 - 99 mg/dL   Chol/HDL Ratio 5.0 (H) 0.0 - 4.4 ratio units    Comment:                                   T. Chol/HDL Ratio                                             Men  Women  1/2 Avg.Risk  3.4    3.3                                   Avg.Risk  5.0    4.4                                2X Avg.Risk  9.6    7.1                                3X Avg.Risk 23.4   11.0   Urinalysis, Routine w reflex microscopic (not at [facility])     Status: Abnormal   Collection Time: 02/09/16  8:57 AM  Result Value Ref Range   Specific Gravity, UA 1.015 1.005 - 1.030   pH, UA 6.0 5.0 - 7.5   Color, UA Yellow Yellow   Appearance Ur Clear Clear   Leukocytes, UA Trace (A) Negative   Protein, UA 1+ (A) Negative/Trace   Glucose, UA Negative Negative   Ketones, UA Negative Negative   RBC, UA Trace (A) Negative   Bilirubin, UA Negative Negative   Urobilinogen, Ur 0.2 0.2 - 1.0 mg/dL   Nitrite, UA Negative Negative   Microscopic Examination See below:   TSH     Status: None   Collection Time: 02/09/16  8:57 AM  Result Value Ref Range   TSH 0.899 0.450 - 4.500 uIU/mL  Bayer DCA Hb A1c  Waived (STAT)     Status: None   Collection Time: 02/09/16  8:57 AM  Result Value Ref Range   Bayer DCA Hb A1c Waived 5.8 <7.0 %    Comment:                                       Diabetic Adult            <7.0                                       Healthy Adult        4.3 - 5.7                                                           (DCCT/NGSP) American Diabetes Association's Summary of Glycemic Recommendations for Adults with Diabetes: Hemoglobin A1c <7.0%. More stringent glycemic goals (A1c <6.0%) may further reduce complications at the cost of increased risk of hypoglycemia.   Microalbumin, Urine Waived     Status: Abnormal   Collection Time: 02/09/16  8:57 AM  Result Value Ref Range   Microalb, Ur Waived 30 (H) 0 - 19 mg/L   Creatinine, Urine Waived 200 10 - 300 mg/dL   Microalb/Creat Ratio <30 <30 mg/g    Comment:                              Abnormal:       30 -  300                         High Abnormal:           >300   Microscopic Examination     Status: Abnormal   Collection Time: 02/09/16  8:57 AM  Result Value Ref Range   WBC, UA 0-5 0 - 5 /hpf   RBC, UA 0-2 0 - 2 /hpf   Epithelial Cells (non renal) 0-10 0 - 10 /hpf   Renal Epithel, UA 0-10 (A) None seen /hpf   Bacteria, UA Few (A) None seen/Few    Assessment/Plan:  Diabetes blood glucose control important in reducing the progression of atherosclerotic disease. Also, involved in wound healing. On appropriate medications.   Hyperlipidemia lipid control important in reducing the progression of atherosclerotic disease. Continue statin therapy   Tobacco abuse We had a discussion for approximately 4-5 minutes regarding the absolute need for smoking cessation due to the deleterious nature of tobacco on the vascular system. We discussed the tobacco use would diminish patency of any intervention, and likely significantly worsen progressio of disease. We discussed multiple agents for quitting including replacement  therapy or medications to reduce cravings such as Chantix. The patient voices their understanding of the importance of smoking cessation.   Atherosclerosis of native arteries of extremity with intermittent claudication ([facility]) The patient describes symptoms consistent with claudication of both lower extremities which is long-standing. 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 We had a long discussion regarding the pathophysiology and natural history of both peripheral arterial disease as well as blue toe syndrome. She reports severe pain. I have offered her noninvasive studies for full other evaluation, but she would like to proceed as soon as possible to angiography. Given her strong clinical history and exam I think that is certainly reasonable. I have added Plavix to her regimen today. She will continue taking her Lipitor. Smoking cessation is strongly recommended.  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) The patient describes symptoms consistent with claudication of both lower extremities which is long-standing. 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 We had a long discussion regarding the pathophysiology and natural history of both peripheral arterial disease as well as blue toe syndrome. She reports severe pain. I have offered her noninvasive studies for full other evaluation, but she would like to proceed as soon as possible to angiography. Given her strong clinical history and exam I think that is certainly reasonable. I have added Plavix to her regimen today. She will continue taking her Lipitor. Smoking cessation is strongly recommended.      Leotis Pain 03/04/2016, 1:28 PM   This note was created with Dragon medical transcription system.  Any errors from dictation are unintentional.

## 2016-03-04 NOTE — Assessment & Plan Note (Signed)
lipid control important in reducing the progression of atherosclerotic disease. Continue statin therapy  

## 2016-03-04 NOTE — Assessment & Plan Note (Signed)
We had a discussion for approximately 4-5 minutes regarding the absolute need for smoking cessation due to the deleterious nature of tobacco on the vascular system. We discussed the tobacco use would diminish patency of any intervention, and likely significantly worsen progressio of disease. We discussed multiple agents for quitting including replacement therapy or medications to reduce cravings such as Chantix. The patient voices their understanding of the importance of smoking cessation.  

## 2016-03-04 NOTE — Assessment & Plan Note (Signed)
blood glucose control important in reducing the progression of atherosclerotic disease. Also, involved in wound healing. On appropriate medications.  

## 2016-03-08 ENCOUNTER — Other Ambulatory Visit (INDEPENDENT_AMBULATORY_CARE_PROVIDER_SITE_OTHER): Payer: Self-pay | Admitting: Vascular Surgery

## 2016-03-08 ENCOUNTER — Encounter (INDEPENDENT_AMBULATORY_CARE_PROVIDER_SITE_OTHER): Payer: Self-pay

## 2016-03-09 ENCOUNTER — Encounter
Admission: RE | Admit: 2016-03-09 | Discharge: 2016-03-09 | Disposition: A | Discharge status: Home / Self Care | Payer: Medicare Other | Source: Ambulatory Visit | Attending: Vascular Surgery | Admitting: Vascular Surgery

## 2016-03-09 DIAGNOSIS — I739 Peripheral vascular disease, unspecified: Secondary | ICD-10-CM | POA: Insufficient documentation

## 2016-03-09 DIAGNOSIS — Z01812 Encounter for preprocedural laboratory examination: Secondary | ICD-10-CM | POA: Diagnosis not present

## 2016-03-09 HISTORY — DX: Peripheral vascular disease, unspecified: I73.9

## 2016-03-09 HISTORY — DX: Type 2 diabetes mellitus without complications: E11.9

## 2016-03-09 HISTORY — DX: Tobacco use: Z72.0

## 2016-03-09 LAB — CREATININE, SERUM
Creatinine, Ser: 1.02 mg/dL — ABNORMAL HIGH (ref 0.44–1.00)
GFR calc Af Amer: >60 mL/min (ref >60)
GFR calc non Af Amer: 60 mL/min — ABNORMAL LOW (ref >60)

## 2016-03-09 LAB — SURGICAL PCR SCREEN
MRSA, PCR: NEGATIVE
Staphylococcus aureus: POSITIVE — AB

## 2016-03-09 LAB — BUN: BUN: 20 mg/dL (ref 6–20)

## 2016-03-09 NOTE — Patient Instructions (Signed)
[facility] VEIN AND VASCULAR SURGERY  Your procedure is scheduled with Dr  Lucky Cowboy                              On:  Date: March 17, 2016 (Thursday)              Time: 90:30 AM     On arrival go [facility] on first floor of the [facility]. Your arrival time will be approximately 1-1.5 hours prior to you procedure start time. This allows time to check lab results and prep you for the procedure.  If your procedure time change you will be notified by the doctor's office.    _x____Do not eat or drink 8 hours prior to your procedure.    __x___Take all your morning medications with sips of water.  __x___Continue Plavix and/or Aspirin.  _____Take half of your bedtime Insulin.  _____Take half of  your morning Insulin.  _____Bring your current medications in their bottle with you.  __x___Do not take Metformin 24 hours before procedure or 48 hours after you procedure. (Stop Metformin on March 16, 2016)  _____Stop Pradaxa, Xarelto, Coumadin, Effient, or Eliquis for 3 days prior to your                      procedure.  Your recovery time will be 1-2 hours (1 hour for a  vein stick, 2 hours for an artery stick )  Please call Dr Lucky Cowboy and Dr Nino Parsley office with any questions or concerns: [phone].  You will need to have someone drive you home and stay with you the night of the procedure.

## 2016-03-11 ENCOUNTER — Other Ambulatory Visit: Payer: Self-pay

## 2016-03-17 ENCOUNTER — Encounter: Payer: Self-pay | Admitting: *Deleted

## 2016-03-17 ENCOUNTER — Ambulatory Visit
Admission: RE | Admit: 2016-03-17 | Discharge: 2016-03-17 | Disposition: A | Discharge status: Home / Self Care | Payer: Medicare Other | Source: Ambulatory Visit | Attending: Vascular Surgery | Admitting: Vascular Surgery

## 2016-03-17 ENCOUNTER — Encounter
Admission: RE | Disposition: A | Discharge status: Home / Self Care | Payer: Self-pay | Source: Ambulatory Visit | Attending: Vascular Surgery

## 2016-03-17 DIAGNOSIS — Z91040 Latex allergy status: Secondary | ICD-10-CM | POA: Insufficient documentation

## 2016-03-17 DIAGNOSIS — I75022 Atheroembolism of left lower extremity: Secondary | ICD-10-CM | POA: Insufficient documentation

## 2016-03-17 DIAGNOSIS — Z90710 Acquired absence of both cervix and uterus: Secondary | ICD-10-CM | POA: Diagnosis not present

## 2016-03-17 DIAGNOSIS — E119 Type 2 diabetes mellitus without complications: Secondary | ICD-10-CM | POA: Diagnosis not present

## 2016-03-17 DIAGNOSIS — K219 Gastro-esophageal reflux disease without esophagitis: Secondary | ICD-10-CM | POA: Diagnosis not present

## 2016-03-17 DIAGNOSIS — Z86010 Personal history of colonic polyps: Secondary | ICD-10-CM | POA: Diagnosis not present

## 2016-03-17 DIAGNOSIS — Z8619 Personal history of other infectious and parasitic diseases: Secondary | ICD-10-CM | POA: Insufficient documentation

## 2016-03-17 DIAGNOSIS — G8929 Other chronic pain: Secondary | ICD-10-CM | POA: Diagnosis not present

## 2016-03-17 DIAGNOSIS — M169 Osteoarthritis of hip, unspecified: Secondary | ICD-10-CM | POA: Insufficient documentation

## 2016-03-17 DIAGNOSIS — Z808 Family history of malignant neoplasm of other organs or systems: Secondary | ICD-10-CM | POA: Insufficient documentation

## 2016-03-17 DIAGNOSIS — Z7984 Long term (current) use of oral hypoglycemic drugs: Secondary | ICD-10-CM | POA: Insufficient documentation

## 2016-03-17 DIAGNOSIS — F17210 Nicotine dependence, cigarettes, uncomplicated: Secondary | ICD-10-CM | POA: Diagnosis not present

## 2016-03-17 DIAGNOSIS — I70212 Atherosclerosis of native arteries of extremities with intermittent claudication, left leg: Secondary | ICD-10-CM | POA: Diagnosis not present

## 2016-03-17 DIAGNOSIS — Z8342 Family history of familial hypercholesterolemia: Secondary | ICD-10-CM | POA: Diagnosis not present

## 2016-03-17 DIAGNOSIS — M545 Low back pain: Secondary | ICD-10-CM | POA: Diagnosis not present

## 2016-03-17 DIAGNOSIS — I70218 Atherosclerosis of native arteries of extremities with intermittent claudication, other extremity: Secondary | ICD-10-CM | POA: Insufficient documentation

## 2016-03-17 DIAGNOSIS — Z881 Allergy status to other antibiotic agents status: Secondary | ICD-10-CM | POA: Insufficient documentation

## 2016-03-17 DIAGNOSIS — E785 Hyperlipidemia, unspecified: Secondary | ICD-10-CM | POA: Insufficient documentation

## 2016-03-17 HISTORY — PX: PERIPHERAL VASCULAR CATHETERIZATION: SHX172C

## 2016-03-17 LAB — GLUCOSE, CAPILLARY: GLUCOSE-CAPILLARY: 100 mg/dL — AB (ref 65–99)

## 2016-03-17 SURGERY — LOWER EXTREMITY ANGIOGRAPHY
Anesthesia: Moderate Sedation | Site: Leg Lower | Laterality: Left

## 2016-03-17 MED ORDER — DEXTROSE 5 % IV SOLN: 1.5000 g | INTRAVENOUS | Status: DC

## 2016-03-17 MED ORDER — METOPROLOL TARTRATE 5 MG/5ML IV SOLN: 2.0000 mg | INTRAVENOUS | Status: DC | PRN

## 2016-03-17 MED ORDER — SODIUM CHLORIDE 0.9 % IV SOLN: 500.0000 mL | Freq: Once | INTRAVENOUS | Status: DC | PRN

## 2016-03-17 MED ORDER — SODIUM CHLORIDE 0.9 % IV SOLN
INTRAVENOUS | Status: DC
  Administered 2016-03-17: 10:00:00 via INTRAVENOUS

## 2016-03-17 MED ORDER — ONDANSETRON HCL 4 MG/2ML IJ SOLN: 4.0000 mg | Freq: Four times a day (QID) | INTRAMUSCULAR | Status: DC | PRN

## 2016-03-17 MED ORDER — FENTANYL CITRATE (PF) 100 MCG/2ML IJ SOLN
INTRAMUSCULAR | Status: DC | PRN
  Administered 2016-03-17 (×4): 50 ug via INTRAVENOUS

## 2016-03-17 MED ORDER — FENTANYL CITRATE (PF) 100 MCG/2ML IJ SOLN
INTRAMUSCULAR | Status: AC
  Filled 2016-03-17: qty 4

## 2016-03-17 MED ORDER — HYDROMORPHONE HCL 1 MG/ML IJ SOLN: 1.0000 mg | Freq: Once | INTRAMUSCULAR | Status: DC

## 2016-03-17 MED ORDER — ACETAMINOPHEN 325 MG RE SUPP: 325.0000 mg | RECTAL | Status: DC | PRN

## 2016-03-17 MED ORDER — ACETAMINOPHEN 325 MG PO TABS: 325.0000 mg | ORAL_TABLET | ORAL | Status: DC | PRN

## 2016-03-17 MED ORDER — GUAIFENESIN-DM 100-10 MG/5ML PO SYRP: 15.0000 mL | ORAL_SOLUTION | ORAL | Status: DC | PRN

## 2016-03-17 MED ORDER — CEFAZOLIN IN D5W 1 GM/50ML IV SOLN: 1.0000 g | Freq: Once | INTRAVENOUS | Status: DC

## 2016-03-17 MED ORDER — LIDOCAINE-EPINEPHRINE 1 %-1:100000 IJ SOLN
INTRAMUSCULAR | Status: AC
  Filled 2016-03-17: qty 1

## 2016-03-17 MED ORDER — PHENOL 1.4 % MT LIQD
1.0000 | OROMUCOSAL | Status: DC | PRN
  Filled 2016-03-17: qty 177

## 2016-03-17 MED ORDER — MIDAZOLAM HCL 5 MG/5ML IJ SOLN
INTRAMUSCULAR | Status: AC
  Filled 2016-03-17: qty 5

## 2016-03-17 MED ORDER — FENTANYL CITRATE (PF) 100 MCG/2ML IJ SOLN
INTRAMUSCULAR | Status: AC
  Filled 2016-03-17: qty 2

## 2016-03-17 MED ORDER — HYDROMORPHONE HCL 1 MG/ML IJ SOLN: 0.5000 mg | INTRAMUSCULAR | Status: DC | PRN

## 2016-03-17 MED ORDER — OXYCODONE-ACETAMINOPHEN 5-325 MG PO TABS: 1.0000 | ORAL_TABLET | ORAL | Status: DC | PRN

## 2016-03-17 MED ORDER — ATORVASTATIN CALCIUM 10 MG PO TABS: 10.0000 mg | ORAL_TABLET | Freq: Every day | ORAL | 3 refills | Status: DC

## 2016-03-17 MED ORDER — METHYLPREDNISOLONE SODIUM SUCC 125 MG IJ SOLR: 125.0000 mg | INTRAMUSCULAR | Status: DC | PRN

## 2016-03-17 MED ORDER — MIDAZOLAM HCL 2 MG/2ML IJ SOLN
INTRAMUSCULAR | Status: AC
  Filled 2016-03-17: qty 2

## 2016-03-17 MED ORDER — HYDRALAZINE HCL 20 MG/ML IJ SOLN: 5.0000 mg | INTRAMUSCULAR | Status: DC | PRN

## 2016-03-17 MED ORDER — OXYCODONE-ACETAMINOPHEN 5-325 MG PO TABS
ORAL_TABLET | ORAL | Status: AC
Start: 2016-03-17 — End: 2016-03-17
  Administered 2016-03-17: 1 via ORAL
  Filled 2016-03-17: qty 1

## 2016-03-17 MED ORDER — LABETALOL HCL 5 MG/ML IV SOLN: 10.0000 mg | INTRAVENOUS | Status: DC | PRN

## 2016-03-17 MED ORDER — FAMOTIDINE 20 MG PO TABS: 40.0000 mg | ORAL_TABLET | ORAL | Status: DC | PRN

## 2016-03-17 MED ORDER — MIDAZOLAM HCL 2 MG/2ML IJ SOLN
INTRAMUSCULAR | Status: DC | PRN
  Administered 2016-03-17 (×3): 1 mg via INTRAVENOUS
  Administered 2016-03-17: 2 mg via INTRAVENOUS

## 2016-03-17 MED ORDER — OXYCODONE-ACETAMINOPHEN 5-325 MG PO TABS
1.0000 | ORAL_TABLET | Freq: Once | ORAL | Status: AC
  Administered 2016-03-17: 1 via ORAL

## 2016-03-17 SURGICAL SUPPLY — 7 items
CATH PIG 70CM (CATHETERS) ×2 IMPLANT
DEVICE STARCLOSE SE CLOSURE (Vascular Products) ×2 IMPLANT
PACK ANGIOGRAPHY (CUSTOM PROCEDURE TRAY) ×2 IMPLANT
SHEATH BRITE TIP 5FRX11 (SHEATH) ×2 IMPLANT
SYR MEDRAD MARK V 150ML (SYRINGE) ×2 IMPLANT
TUBING CONTRAST HIGH PRESS 72 (TUBING) ×2 IMPLANT
WIRE J 3MM .035X145CM (WIRE) ×2 IMPLANT

## 2016-03-17 NOTE — Discharge Instructions (Signed)
Angiogram, Care After °These instructions give you information about caring for yourself after your procedure. Your doctor may also give you more specific instructions. Call your doctor if you have any problems or questions after your procedure. °Follow these instructions at home: °· Take medicines only as told by your doctor. °· Follow your doctor's instructions about: °¨ Care of the area where the tube was inserted. °¨ Bandage (dressing) changes and removal. °· You may shower 24-48 hours after the procedure or as told by your doctor. °· Do not take baths, swim, or use a hot tub until your doctor approves. °· Every day, check the area where the tube was inserted. Watch for: °¨ Redness, swelling, or pain. °¨ Fluid, blood, or pus. °· Do not apply powder or lotion to the site. °· Do not lift anything that is heavier than 10 lb (4.5 kg) for 5 days or as told by your doctor. °· Ask your doctor when you can: °¨ Return to work or school. °¨ Do physical activities or play sports. °¨ Have sex. °· Do not drive or operate heavy machinery for 24 hours or as told by your doctor. °· Have someone with you for the first 24 hours after the procedure. °· Keep all follow-up visits as told by your doctor. This is important. °Contact a health care provider if: °· You have a fever. °· You have chills. °· You have more bleeding from the area where the tube was inserted. Hold pressure on the area. °· You have redness, swelling, or pain in the area where the tube was inserted. °· You have fluid or pus coming from the area. °Get help right away if: °· You have a lot of pain in the area where the tube was inserted. °· The area where the tube was inserted is bleeding, and the bleeding does not stop after 30 minutes of holding steady pressure on the area. °· The area near or just beyond the insertion site becomes pale, cool, tingly, or numb. °This information is not intended to replace advice given to you by your health care provider. Make  sure you discuss any questions you have with your health care provider. °Document Released: 06/17/2008 Document Revised: 08/27/2015 Document Reviewed: 08/22/2012 °Elsevier Interactive Patient Education © 2017 [employer] ° °

## 2016-03-17 NOTE — Progress Notes (Signed)
Pt sat up,  rt fem site wnl post sitting. Pt given meal tray and coffee

## 2016-03-17 NOTE — Op Note (Signed)
[facility] Percutaneous Study/Intervention Procedural Note   Date of Surgery: 03/17/2016  Surgeon(s):Myrlene Riera   Assistants:none  Pre-operative Diagnosis: PAD with atheroembolization to the left foot  Post-operative diagnosis: Same  Procedure(s) Performed: 1. Ultrasound guidance for vascular access right femoral artery 2. Catheter placement into left superficial femoral artery from right femoral approach 3. Aortogram and selective left lower extremity angiogram 4. 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 The patient is brought in for angiography for further evaluation and potential treatment. Risks and benefits are discussed and informed consent is obtained  Procedure: The patient was identified and appropriate procedural time out was performed. The patient was then placed supine on the table and prepped and draped in the usual sterile fashion.Moderate conscious sedation was administered during a face to face encounter with the patient throughout the procedure with my supervision of the RN administering medicines and monitoring the patient's vital signs, pulse oximetry, telemetry and mental status throughout from the start of the procedure until the patient was taken to the recovery room. Ultrasound was used to evaluate the right common femoral artery. It was patent . A digital ultrasound image was acquired. A Seldinger needle was used to access the right common femoral artery under direct ultrasound guidance and a permanent image was performed. A 0.035 J wire was advanced without resistance and a 5Fr sheath  was placed. Pigtail catheter was placed into the aorta and an AP aortogram was performed. This demonstrated normal renal arteries and normal aorta and iliac segments without significant stenosis. I then crossed the aortic bifurcation and advanced to the left femoral head. Selective left lower extremity angiogram was then performed. This demonstrated mild irregularity at Hunter's canal with an otherwise patent common femoral artery, profunda femoris artery, superficial femoral artery, and popliteal artery. The degree of stenosis in this location is minimal and less than 20%. There is then two-vessel runoff to the foot. The flow was reasonably slow into the foot and there was some intrinsic small vessel disease in the foot. Because the slow flow, the catheter was actually advanced into the mid superficial femoral artery to opacify the tibial and pedal vessels. Given these findings, no intervention was warranted and medical management alone would be planned. I elected to terminate the procedure. The sheath was removed and StarClose closure device was deployed in the right femoral artery with excellent hemostatic result. The patient was taken to the recovery room in stable condition having tolerated the procedure well.  Findings:  Aortogram: Normal renal arteries, normal aorta and iliac arteries without significant stenosis Left Lower Extremity: Mild irregularity at Hunter's canal with an otherwise patent common femoral artery, profunda femoris artery, superficial femoral artery, and popliteal artery. The degree of stenosis in this location is minimal and less than 20%. There is then two-vessel runoff to the foot. The flow was reasonably slow into the foot and there was some intrinsic small vessel disease in the foot. Because the slow flow, the catheter was actually advanced into the mid superficial femoral artery to opacify the tibial and pedal vessels.   Disposition: Patient was  taken to the recovery room in stable condition having tolerated the procedure well.  Complications: None  Leotis Pain 03/17/2016 11:10  AM   This note was created with Dragon Medical transcription system. Any errors in dictation are purely unintentional.

## 2016-03-17 NOTE — H&P (Signed)
[facility] History & Physical Update  The patient was interviewed and re-examined.  The patient's previous History and Physical has been reviewed and is unchanged.  There is no change in the plan of care. We plan to proceed with the scheduled procedure.  Leotis Pain, MD  03/17/2016, 9:29 AM

## 2016-03-18 ENCOUNTER — Encounter: Payer: Self-pay | Admitting: Vascular Surgery

## 2016-04-08 ENCOUNTER — Ambulatory Visit (INDEPENDENT_AMBULATORY_CARE_PROVIDER_SITE_OTHER): Payer: Medicare Other | Admitting: Vascular Surgery

## 2016-04-08 ENCOUNTER — Encounter (INDEPENDENT_AMBULATORY_CARE_PROVIDER_SITE_OTHER): Payer: Self-pay | Admitting: Vascular Surgery

## 2016-04-08 VITALS — BP 137/82 | HR 70 | Resp 17 | Wt 213.0 lb

## 2016-04-08 DIAGNOSIS — E119 Type 2 diabetes mellitus without complications: Secondary | ICD-10-CM | POA: Diagnosis not present

## 2016-04-08 DIAGNOSIS — E782 Mixed hyperlipidemia: Secondary | ICD-10-CM | POA: Diagnosis not present

## 2016-04-08 DIAGNOSIS — I75022 Atheroembolism of left lower extremity: Secondary | ICD-10-CM

## 2016-04-08 NOTE — Assessment & Plan Note (Signed)
blood glucose control important in reducing the progression of atherosclerotic disease. Also, involved in wound healing. On appropriate medications.  

## 2016-04-08 NOTE — Progress Notes (Signed)
MRN : [id number]  Stacey Gross is a 56 y.o. (1960-05-05) female who presents with chief complaint of  Chief Complaint  Patient presents with  . Follow-up  .  HPI: Patient returns in follow-up after an angiogram 3 weeks ago. She had no periprocedural complications. Her third toe cyanosis has improved but her great toe cyanosis remains. Her foot is feeling better with less pain at her last visit. her angiogram showed no large vessel disease that would require revascularization and she has been maintained on antiplatelet therapy.        Past Medical History:  Diagnosis Date  . Chronic back pain   . Degenerative joint disease (DJD) of hip   . GERD (gastroesophageal reflux disease)    takes Omeprazole daily  . History of bronchitis yrs ago  . History of colon polyps    benign  . History of staph infection 10 yrs ago  . Hyperlipidemia    takes Atorvastatin daily  . Weakness    numbness and tingling in both legs          Past Surgical History:  Procedure Laterality Date  . ABDOMINAL HYSTERECTOMY     total  . APPENDECTOMY    . BACK SURGERY  9-10 yrs ago  . COLONOSCOPY    . HERNIA REPAIR            Family History  Problem Relation Age of Onset  . Hyperlipidemia Mother   . Hyperlipidemia Father   . Cancer Sister     melanoma  No known history of bleeding disorders or clotting disorders.  Familiar with peripheral arterial disease as her husband has had a treated many times  Social History      Social History  Substance Use Topics  . Smoking status: Current Every Day Smoker    Packs/day: 0.50    Years: 43.00    Types: Cigarettes  . Smokeless tobacco: Never Used  . Alcohol use No  No IV drug use      Allergies  Allergen Reactions  . Doxycycline Hyclate Anaphylaxis, Swelling and Rash  . Latex Rash  . Vancomycin Rash          Current Outpatient Prescriptions  Medication Sig Dispense Refill  . atorvastatin (LIPITOR)  20 MG tablet Take 1 tablet (20 mg total) by mouth at bedtime. 90 tablet 4  . Blood Glucose Monitoring Suppl (ONE TOUCH ULTRA SYSTEM KIT) W/DEVICE KIT 1 kit by Does not apply route once. 100 each 12  . glucose blood test strip Use as instructed 100 each 12  . lisinopril (PRINIVIL,ZESTRIL) 10 MG tablet Take 1 tablet (10 mg total) by mouth daily. 90 tablet 4  . meloxicam (MOBIC) 15 MG tablet Take 1 tablet (15 mg total) by mouth daily. 90 tablet 4  . metFORMIN (GLUCOPHAGE) 500 MG tablet Take 2 tablets (1,000 mg total) by mouth 2 (two) times daily. 180 tablet 4  . omeprazole (PRILOSEC) 20 MG capsule Take 1 capsule (20 mg total) by mouth daily. 90 capsule 4  . pioglitazone (ACTOS) 30 MG tablet Take 1 tablet (30 mg total) by mouth daily. 90 tablet 4  . sulfamethoxazole-trimethoprim (BACTRIM DS,SEPTRA DS) 800-160 MG tablet Take 1 tablet by mouth 2 (two) times daily. 180 tablet 4  . traMADol (ULTRAM) 50 MG tablet Take 1 tablet (50 mg total) by mouth every 8 (eight) hours as needed. 30 tablet 0   No current facility-administered medications for this visit.  REVIEW OF SYSTEMS (Negative unless checked)  Constitutional: [] Weight loss  [] Fever  [] Chills Cardiac: [] Chest pain   [] Chest pressure   [] Palpitations   [] Shortness of breath when laying flat   [] Shortness of breath at rest   [] Shortness of breath with exertion. Vascular:  [x] Pain in legs with walking   [] Pain in legs at rest   [] Pain in legs when laying flat   [x] Claudication   [] Pain in feet when walking  [x] Pain in feet at rest  [] Pain in feet when laying flat   [] History of DVT   [] Phlebitis   [] Swelling in legs   [] Varicose veins   [] Non-healing ulcers Pulmonary:   [] Uses home oxygen   [] Productive cough   [] Hemoptysis   [] Wheeze  [] COPD   [] Asthma Neurologic:  [] Dizziness  [] Blackouts   [] Seizures   [] History of stroke   [] History of TIA  [] Aphasia   [] Temporary blindness   [] Dysphagia   [] Weakness or numbness in arms   [] Weakness or  numbness in legs Musculoskeletal:  [] Arthritis   [] Joint swelling   [] Joint pain   [x] Low back pain Hematologic:  [] Easy bruising  [] Easy bleeding   [] Hypercoagulable state   [] Anemic  [] Hepatitis Gastrointestinal:  [] Blood in stool   [] Vomiting blood  [] Gastroesophageal reflux/heartburn   [] Abdominal pain Genitourinary:  [] Chronic kidney disease   [] Difficult urination  [] Frequent urination  [] Burning with urination   [] Hematuria Skin:  [] Rashes   [] Ulcers   [] Wounds Psychological:  [] History of anxiety   []  History of major depression.    Physical Exam BP 134/85   Pulse 68   Resp 16   Ht 5' 7"  (1.702 m)   Wt 95.1 kg (209 lb 9.6 oz)   BMI 32.83 kg/m  Gen:  WD/WN, NAD. Appears older than stated age Head: /AT, No temporalis wasting. Prominent temp pulse not noted. Ear/Nose/Throat: Hearing grossly intact, nares w/o erythema or drainage, oropharynx w/o Erythema/Exudate Eyes: Conjunctiva clear, sclera non-icteric  Neck: trachea midline.  No JVD.  Pulmonary:  Good air movement, respirations nonlabored, no use of accessory muscles  Cardiac: RRR, normal S1, S2 Vascular:  Vessel Right Left  Radial Palpable Palpable  Ulnar Palpable Palpable  Brachial Palpable Palpable  Carotid Palpable, without bruit Palpable, without bruit  Aorta Not palpable N/A  Femoral Palpable Palpable  Popliteal Palpable Trace Palpable  PT 1+ Palpable 1+ Palpable  DP 1+ Palpable 1+ Palpable   Gastrointestinal: soft, non-tender/non-distended. No guarding/reflex. No masses, surgical incisions, or scars. Musculoskeletal: M/S 5/5 throughout.  Cyanosis is present in the left great toe. The remainder of the toes on both feet have pretty good capillary refill at this point. Neurologic: Sensation grossly intact in extremities.  Symmetrical.  Speech is fluent. Motor exam as listed above. Psychiatric: Judgment intact, Mood & affect appropriate for pt's clinical situation. Dermatologic: No rashes or ulcers noted.  No  cellulitis or open wounds. Lymph : No Cervical, Axillary, or Inguinal lymphadenopathy.    Labs Recent Results (from the past 2160 hour(s))  CBC with Differential/Platelet     Status: None   Collection Time: 02/09/16  8:57 AM  Result Value Ref Range   WBC 6.8 3.4 - 10.8 x10E3/uL   RBC 4.50 3.77 - 5.28 x10E6/uL   Hemoglobin 13.4 11.1 - 15.9 g/dL   Hematocrit 41.0 34.0 - 46.6 %   MCV 91 79 - 97 fL   MCH 29.8 26.6 - 33.0 pg   MCHC 32.7 31.5 - 35.7 g/dL   RDW 13.6 12.3 -  15.4 %   Platelets 300 150 - 379 x10E3/uL   Neutrophils 53 Not Estab. %    Comment: Occasional metamyelocyte seen on scan.   Lymphs 37 Not Estab. %   Monocytes 7 Not Estab. %   Eos 2 Not Estab. %   Basos 1 Not Estab. %   Neutrophils Absolute 3.6 1.4 - 7.0 x10E3/uL   Lymphocytes Absolute 2.5 0.7 - 3.1 x10E3/uL   Monocytes Absolute 0.5 0.1 - 0.9 x10E3/uL   EOS (ABSOLUTE) 0.1 0.0 - 0.4 x10E3/uL   Basophils Absolute 0.1 0.0 - 0.2 x10E3/uL   Immature Granulocytes 0 Not Estab. %   Immature Grans (Abs) 0.0 0.0 - 0.1 x10E3/uL   Hematology Comments: Note:     Comment: Verified by microscopic examination.  Comprehensive metabolic panel     Status: Abnormal   Collection Time: 02/09/16  8:57 AM  Result Value Ref Range   Glucose 99 65 - 99 mg/dL   BUN 18 6 - 24 mg/dL   Creatinine, Ser 1.03 (H) 0.57 - 1.00 mg/dL   GFR calc non Af Amer 60 >59 mL/min/1.73   GFR calc Af Amer 70 >59 mL/min/1.73   BUN/Creatinine Ratio 17 9 - 23   Sodium 140 134 - 144 mmol/L   Potassium 4.9 3.5 - 5.2 mmol/L   Chloride 101 96 - 106 mmol/L   CO2 25 18 - 29 mmol/L   Calcium 9.5 8.7 - 10.2 mg/dL   Total Protein 6.5 6.0 - 8.5 g/dL   Albumin 4.2 3.5 - 5.5 g/dL   Globulin, Total 2.3 1.5 - 4.5 g/dL   Albumin/Globulin Ratio 1.8 1.2 - 2.2   Bilirubin Total 0.4 0.0 - 1.2 mg/dL   Alkaline Phosphatase 88 39 - 117 IU/L   AST 18 0 - 40 IU/L   ALT 16 0 - 32 IU/L  Lipid Profile     Status: Abnormal   Collection Time: 02/09/16  8:57 AM  Result Value  Ref Range   Cholesterol, Total 198 100 - 199 mg/dL   Triglycerides 209 (H) 0 - 149 mg/dL   HDL 40 >39 mg/dL   VLDL Cholesterol Cal 42 (H) 5 - 40 mg/dL   LDL Calculated 116 (H) 0 - 99 mg/dL   Chol/HDL Ratio 5.0 (H) 0.0 - 4.4 ratio units    Comment:                                   T. Chol/HDL Ratio                                             Men  Women                               1/2 Avg.Risk  3.4    3.3                                   Avg.Risk  5.0    4.4                                2X Avg.Risk  9.6    7.1  3X Avg.Risk 23.4   11.0   Urinalysis, Routine w reflex microscopic (not at [facility])     Status: Abnormal   Collection Time: 02/09/16  8:57 AM  Result Value Ref Range   Specific Gravity, UA 1.015 1.005 - 1.030   pH, UA 6.0 5.0 - 7.5   Color, UA Yellow Yellow   Appearance Ur Clear Clear   Leukocytes, UA Trace (A) Negative   Protein, UA 1+ (A) Negative/Trace   Glucose, UA Negative Negative   Ketones, UA Negative Negative   RBC, UA Trace (A) Negative   Bilirubin, UA Negative Negative   Urobilinogen, Ur 0.2 0.2 - 1.0 mg/dL   Nitrite, UA Negative Negative   Microscopic Examination See below:   TSH     Status: None   Collection Time: 02/09/16  8:57 AM  Result Value Ref Range   TSH 0.899 0.450 - 4.500 uIU/mL  Bayer DCA Hb A1c Waived (STAT)     Status: None   Collection Time: 02/09/16  8:57 AM  Result Value Ref Range   Bayer DCA Hb A1c Waived 5.8 <7.0 %    Comment:                                       Diabetic Adult            <7.0                                       Healthy Adult        4.3 - 5.7                                                           (DCCT/NGSP) American Diabetes Association's Summary of Glycemic Recommendations for Adults with Diabetes: Hemoglobin A1c <7.0%. More stringent glycemic goals (A1c <6.0%) may further reduce complications at the cost of increased risk of hypoglycemia.   Microalbumin, Urine Waived     Status:  Abnormal   Collection Time: 02/09/16  8:57 AM  Result Value Ref Range   Microalb, Ur Waived 30 (H) 0 - 19 mg/L   Creatinine, Urine Waived 200 10 - 300 mg/dL   Microalb/Creat Ratio <30 <30 mg/g    Comment:                              Abnormal:       30 - 300                         High Abnormal:           >300   Microscopic Examination     Status: Abnormal   Collection Time: 02/09/16  8:57 AM  Result Value Ref Range   WBC, UA 0-5 0 - 5 /hpf   RBC, UA 0-2 0 - 2 /hpf   Epithelial Cells (non renal) 0-10 0 - 10 /hpf   Renal Epithel, UA 0-10 (A) None seen /hpf   Bacteria, UA Few (A) None seen/Few  BUN     Status: None  Collection Time: 03/09/16 12:54 PM  Result Value Ref Range   BUN 20 6 - 20 mg/dL  Creatinine, serum     Status: Abnormal   Collection Time: 03/09/16 12:54 PM  Result Value Ref Range   Creatinine, Ser 1.02 (H) 0.44 - 1.00 mg/dL   GFR calc non Af Amer 60 (L) >60 mL/min   GFR calc Af Amer >60 >60 mL/min    Comment: (NOTE) The eGFR has been calculated using the CKD EPI equation. This calculation has not been validated in all clinical situations. eGFR's persistently <60 mL/min signify possible Chronic Kidney Disease.   Surgical pcr screen     Status: Abnormal   Collection Time: 03/09/16 12:54 PM  Result Value Ref Range   MRSA, PCR NEGATIVE NEGATIVE   Staphylococcus aureus POSITIVE (A) NEGATIVE    Comment:        The Xpert SA Assay (FDA approved for NASAL specimens in patients over 54 years of age), is one component of a comprehensive surveillance program.  Test performance has been validated by [facility] for patients greater than or equal to 53 year old. It is not intended to diagnose infection nor to guide or monitor treatment.   Glucose, capillary     Status: Abnormal   Collection Time: 03/17/16 10:06 AM  Result Value Ref Range   Glucose-Capillary 100 (H) 65 - 99 mg/dL    Radiology No results found.    Assessment/Plan  Hyperlipidemia lipid  control important in reducing the progression of atherosclerotic disease. Continue statin therapy   Diabetes blood glucose control important in reducing the progression of atherosclerotic disease. Also, involved in wound healing. On appropriate medications.   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 Angiogram showed no large vessel disease requiring revascularization. Continue antiplatelet therapy but can switch to aspirin alone as her symptoms improved. Return to clinic as needed.    Leotis Pain, MD  04/08/2016 9:31 AM    This note was created with Dragon medical transcription system.  Any errors from dictation are purely unintentional

## 2016-04-08 NOTE — Assessment & Plan Note (Signed)
lipid control important in reducing the progression of atherosclerotic disease. Continue statin therapy  

## 2016-04-08 NOTE — Assessment & Plan Note (Signed)
Her symptoms are gradually improving. Angiogram showed no large vessel disease requiring revascularization. Continue antiplatelet therapy but can switch to aspirin alone as her symptoms improved. Return to clinic as needed.

## 2016-05-06 ENCOUNTER — Ambulatory Visit (INDEPENDENT_AMBULATORY_CARE_PROVIDER_SITE_OTHER): Payer: Medicare Other | Admitting: Family Medicine

## 2016-05-06 ENCOUNTER — Encounter: Payer: Self-pay | Admitting: Family Medicine

## 2016-05-06 VITALS — BP 91/58 | HR 90 | Temp 98.6°F | Wt 209.0 lb

## 2016-05-06 DIAGNOSIS — R05 Cough: Secondary | ICD-10-CM

## 2016-05-06 DIAGNOSIS — J411 Mucopurulent chronic bronchitis: Secondary | ICD-10-CM

## 2016-05-06 DIAGNOSIS — R6889 Other general symptoms and signs: Secondary | ICD-10-CM | POA: Diagnosis not present

## 2016-05-06 DIAGNOSIS — R059 Cough, unspecified: Secondary | ICD-10-CM

## 2016-05-06 LAB — VERITOR FLU A/B WAIVED
INFLUENZA B: NEGATIVE
Influenza A: NEGATIVE

## 2016-05-06 MED ORDER — AZITHROMYCIN 250 MG PO TABS: ORAL_TABLET | ORAL | 0 refills | Status: DC

## 2016-05-06 NOTE — Assessment & Plan Note (Signed)
Acute flare from URI. Symbicort samples given. Continue using albuterol nebulizers prn at home. Start azithromycin. Follow up as scheduled for recheck.

## 2016-05-06 NOTE — Patient Instructions (Signed)
Follow up as scheduled.  

## 2016-05-06 NOTE — Progress Notes (Signed)
BP (!) 91/58   Pulse 90   Temp 98.6 F (37 C)   Wt 209 lb (94.8 kg)   SpO2 95%   BMI 32.73 kg/m    Subjective:    Patient ID: Stacey Gross, female    DOB: Dec 24, 1960, 56 y.o.   MRN: [id number]  HPI: Stacey Gross is a 56 y.o. female  Chief Complaint  Patient presents with  . URI    x 8 days, fever, head and chest congestion, cough, sore throat, runny nose, head ache. No body aches.   Patient presents with over a week of intermittent fever, congestion, cough, sore throat, HA, wheezing, DOE. Denies CP, body aches, chills, sore throat. Taking mucinex, alka seltzer cold tablets. States she is not on any inhalers because she can't afford abluterol or the steroid inhalers. Boyfriend brought over his nebulizer and she has been occasionally using albuterol nebs. Feels like sxs have improved the past few days some.   Past Medical History:  Diagnosis Date  . Chronic back pain   . Degenerative joint disease (DJD) of hip   . Diabetes mellitus without complication ([place])   . GERD (gastroesophageal reflux disease)    takes Omeprazole daily  . History of bronchitis yrs ago  . History of colon polyps    benign  . History of staph infection 10 yrs ago  . Hyperlipidemia    takes Atorvastatin daily  . Hypertension   . Peripheral vascular disease ([place])   . Tobacco use    0ne pack of cigaretts daily  . Weakness    numbness and tingling in both legs    Social History   Social History  . Marital status: Widowed    Spouse name: N/A  . Number of children: 0  . Years of education: N/A   Occupational History  . disabled    Social History Main Topics  . Smoking status: Current Every Day Smoker    Packs/day: 1.00    Years: 43.00    Types: Cigarettes  . Smokeless tobacco: Never Used  . Alcohol use No  . Drug use: No  . Sexual activity: Not on file   Other Topics Concern  . Not on file   Social History Narrative  . No narrative on file    Relevant past medical, surgical,  family and social history reviewed and updated as indicated. Interim medical history since our last visit reviewed. Allergies and medications reviewed and updated.  Review of Systems  Constitutional: Positive for fever.  HENT: Positive for congestion, rhinorrhea and sore throat.   Respiratory: Positive for cough, shortness of breath and wheezing.   Cardiovascular: Negative.   Gastrointestinal: Negative.   Genitourinary: Negative.   Musculoskeletal: Negative.   Neurological: Positive for headaches.  Psychiatric/Behavioral: Negative.     Per HPI unless specifically indicated above     Objective:    BP (!) 91/58   Pulse 90   Temp 98.6 F (37 C)   Wt 209 lb (94.8 kg)   SpO2 95%   BMI 32.73 kg/m   Wt Readings from Last 3 Encounters:  05/06/16 209 lb (94.8 kg)  04/08/16 213 lb (96.6 kg)  03/17/16 206 lb (93.4 kg)    Physical Exam  Constitutional: She is oriented to person, place, and time. She appears well-developed and well-nourished.  HENT:  Head: Atraumatic.  Right Ear: External ear normal.  Left Ear: External ear normal.  Nose: Nose normal.  Mouth/Throat: Oropharynx is clear and moist.  Left middle ear effusion  Eyes: Conjunctivae are normal. Pupils are equal, round, and reactive to light.  Neck: Normal range of motion. Neck supple.  Cardiovascular: Normal rate and normal heart sounds.   Pulmonary/Chest: She has wheezes (Moderate wheezes). She has no rales.  Musculoskeletal: Normal range of motion.  Neurological: She is alert and oriented to person, place, and time.  Skin: Skin is warm and dry.  Psychiatric: She has a normal mood and affect. Her behavior is normal.    Results for orders placed or performed during the hospital encounter of 03/17/16  Glucose, capillary  Result Value Ref Range   Glucose-Capillary 100 (H) 65 - 99 mg/dL      Assessment & Plan:   Problem List Items Addressed This Visit      Respiratory   COPD (chronic obstructive pulmonary  disease) ([place]) - Primary    Acute flare from URI. Symbicort samples given. Continue using albuterol nebulizers prn at home. Start azithromycin. Follow up as scheduled for recheck.       Relevant Medications   azithromycin (ZITHROMAX) 250 MG tablet    Other Visit Diagnoses    Cough       Rapid flu negative. Will treat URI symptoms with azithromycin. Continue OTC remedies and supportive care.    Relevant Orders   Influenza A & B (STAT)      Follow up plan: Return for as scheduled next weekF.

## 2016-05-09 ENCOUNTER — Ambulatory Visit: Payer: Medicare Other | Admitting: Family Medicine

## 2016-05-11 ENCOUNTER — Ambulatory Visit (INDEPENDENT_AMBULATORY_CARE_PROVIDER_SITE_OTHER): Payer: Medicare Other | Admitting: Family Medicine

## 2016-05-11 ENCOUNTER — Encounter: Payer: Self-pay | Admitting: Family Medicine

## 2016-05-11 VITALS — BP 107/73 | HR 80 | Temp 98.0°F | Ht 67.0 in | Wt 211.6 lb

## 2016-05-11 DIAGNOSIS — I10 Essential (primary) hypertension: Secondary | ICD-10-CM | POA: Diagnosis not present

## 2016-05-11 DIAGNOSIS — E119 Type 2 diabetes mellitus without complications: Secondary | ICD-10-CM | POA: Diagnosis not present

## 2016-05-11 DIAGNOSIS — J019 Acute sinusitis, unspecified: Secondary | ICD-10-CM | POA: Diagnosis not present

## 2016-05-11 LAB — BAYER DCA HB A1C WAIVED: HB A1C: 5.8 % (ref <7.0)

## 2016-05-11 MED ORDER — AMOXICILLIN-POT CLAVULANATE 875-125 MG PO TABS: 1.0000 | ORAL_TABLET | Freq: Two times a day (BID) | ORAL | 0 refills | Status: DC

## 2016-05-11 NOTE — Progress Notes (Signed)
BP 107/73   Pulse 80   Temp 98 F (36.7 C) (Oral)   Ht 5\' 7"  (1.702 m)   Wt 211 lb 9.6 oz (96 kg)   BMI 33.14 kg/m    Subjective:    Patient ID: Stacey Gross, female    DOB: May 10, 1960, 56 y.o.   MRN: [id number]  HPI: Stacey Gross is a 56 y.o. female  Chief Complaint  Patient presents with  . Follow-up  . Hypertension  . Diabetes  . Cough  Patient follow-up diabetes doing well noted low blood sugar spells no problems with medications taken faithfully. Blood pressure doing well no complaints taking lisinopril without problems Cholesterol no issues. Patient's biggest concern is cough still hasn't cleared up maybe is worse is very loose productive Leory Plowman also sinus congestion pressure. Z-Pak and other medicines given didn't do anything.  Relevant past medical, surgical, family and social history reviewed and updated as indicated. Interim medical history since our last visit reviewed. Allergies and medications reviewed and updated.  Review of Systems  Constitutional: Positive for fatigue. Negative for chills, diaphoresis and fever.  HENT: Positive for congestion, rhinorrhea, sinus pain and sinus pressure.   Respiratory: Positive for cough, choking, shortness of breath and wheezing.   Cardiovascular: Negative.     Per HPI unless specifically indicated above     Objective:    BP 107/73   Pulse 80   Temp 98 F (36.7 C) (Oral)   Ht 5\' 7"  (1.702 m)   Wt 211 lb 9.6 oz (96 kg)   BMI 33.14 kg/m   Wt Readings from Last 3 Encounters:  05/11/16 211 lb 9.6 oz (96 kg)  05/06/16 209 lb (94.8 kg)  04/08/16 213 lb (96.6 kg)    Physical Exam  Constitutional: She is oriented to person, place, and time. She appears well-developed and well-nourished. No distress.  HENT:  Head: Normocephalic and atraumatic.  Right Ear: Hearing and external ear normal.  Left Ear: Hearing and external ear normal.  Nose: Nose normal.  Mouth/Throat: Oropharyngeal exudate present.  Eyes:  Conjunctivae and lids are normal. Right eye exhibits no discharge. Left eye exhibits no discharge. No scleral icterus.  Neck: No thyromegaly present.  Cardiovascular: Normal rate, regular rhythm and normal heart sounds.   Pulmonary/Chest: Effort normal and breath sounds normal. No respiratory distress. She has no wheezes. She has no rales.  Musculoskeletal: Normal range of motion.  Lymphadenopathy:    She has no cervical adenopathy.  Neurological: She is alert and oriented to person, place, and time.  Skin: Skin is intact. No rash noted.  Psychiatric: She has a normal mood and affect. Her speech is normal and behavior is normal. Judgment and thought content normal. Cognition and memory are normal.    Results for orders placed or performed in visit on 05/06/16  Influenza A & B (STAT)  Result Value Ref Range   Influenza A Negative Negative   Influenza B Negative Negative      Assessment & Plan:   Problem List Items Addressed This Visit      Cardiovascular and Mediastinum   Hypertension    The current medical regimen is effective;  continue present plan and medications.       Relevant Orders   Bayer DCA Hb A1c Waived     Endocrine   Diabetes ([place]) - Primary    The current medical regimen is effective;  continue present plan and medications.       Relevant Orders  Bayer DCA Hb A1c Waived    Other Visit Diagnoses    Acute sinusitis, recurrence not specified, unspecified location       Discuss care and treatment sinusitis bronchitis use of Augmentin Mucinex quitting smoking and if worse will need recheck chest x-ray etc.   Relevant Medications   amoxicillin-clavulanate (AUGMENTIN) 875-125 MG tablet       Follow up plan: Return in about 3 months (around 08/08/2016) for Hemoglobin A1c, BMP,  Lipids, ALT, AST.

## 2016-05-11 NOTE — Assessment & Plan Note (Signed)
The current medical regimen is effective;  continue present plan and medications.  

## 2016-06-10 ENCOUNTER — Telehealth: Payer: Self-pay | Admitting: Family Medicine

## 2016-06-10 MED ORDER — FLUTICASONE PROPIONATE 50 MCG/ACT NA SUSP: 2.0000 | Freq: Every day | NASAL | 6 refills | Status: DC

## 2016-06-10 NOTE — Telephone Encounter (Signed)
Pt aware.

## 2016-06-10 NOTE — Telephone Encounter (Signed)
Please advise 

## 2016-06-10 NOTE — Telephone Encounter (Signed)
Rx sent to her pharmacy. If this doesn't help, she will need to be seen.

## 2016-06-10 NOTE — Telephone Encounter (Signed)
Pt called and stated that her allergies were acting up and she has tried zyrtec and eyedrops but neither are helping. She would like to know if she could have something else sent in to [facility].

## 2016-06-13 ENCOUNTER — Other Ambulatory Visit: Payer: Self-pay

## 2016-06-13 DIAGNOSIS — E119 Type 2 diabetes mellitus without complications: Secondary | ICD-10-CM

## 2016-06-13 DIAGNOSIS — I10 Essential (primary) hypertension: Secondary | ICD-10-CM

## 2016-06-13 DIAGNOSIS — E7800 Pure hypercholesterolemia, unspecified: Secondary | ICD-10-CM

## 2016-06-13 MED ORDER — ATORVASTATIN CALCIUM 20 MG PO TABS: 20.0000 mg | ORAL_TABLET | Freq: Every day | ORAL | 0 refills | Status: DC

## 2016-06-13 MED ORDER — PIOGLITAZONE HCL 30 MG PO TABS: 30.0000 mg | ORAL_TABLET | Freq: Every day | ORAL | 0 refills | Status: DC

## 2016-06-13 MED ORDER — LISINOPRIL 10 MG PO TABS: 10.0000 mg | ORAL_TABLET | Freq: Every day | ORAL | 0 refills | Status: DC

## 2016-08-11 ENCOUNTER — Ambulatory Visit (INDEPENDENT_AMBULATORY_CARE_PROVIDER_SITE_OTHER): Payer: Medicare Other | Admitting: Family Medicine

## 2016-08-11 ENCOUNTER — Encounter: Payer: Self-pay | Admitting: Family Medicine

## 2016-08-11 VITALS — BP 121/82 | HR 54 | Ht 67.0 in | Wt 221.0 lb

## 2016-08-11 DIAGNOSIS — F331 Major depressive disorder, recurrent, moderate: Secondary | ICD-10-CM | POA: Diagnosis not present

## 2016-08-11 DIAGNOSIS — E782 Mixed hyperlipidemia: Secondary | ICD-10-CM | POA: Diagnosis not present

## 2016-08-11 DIAGNOSIS — E7800 Pure hypercholesterolemia, unspecified: Secondary | ICD-10-CM

## 2016-08-11 DIAGNOSIS — I75022 Atheroembolism of left lower extremity: Secondary | ICD-10-CM

## 2016-08-11 DIAGNOSIS — I10 Essential (primary) hypertension: Secondary | ICD-10-CM

## 2016-08-11 DIAGNOSIS — K219 Gastro-esophageal reflux disease without esophagitis: Secondary | ICD-10-CM

## 2016-08-11 DIAGNOSIS — T847XXD Infection and inflammatory reaction due to other internal orthopedic prosthetic devices, implants and grafts, subsequent encounter: Secondary | ICD-10-CM | POA: Diagnosis not present

## 2016-08-11 DIAGNOSIS — E119 Type 2 diabetes mellitus without complications: Secondary | ICD-10-CM

## 2016-08-11 LAB — LP+ALT+AST PICCOLO, WAIVED
ALT (SGPT) PICCOLO, WAIVED: 23 U/L (ref 10–47)
AST (SGOT) Piccolo, Waived: 23 U/L (ref 11–38)
Chol/HDL Ratio Piccolo,Waive: 3.7 mg/dL
Cholesterol Piccolo, Waived: 187 mg/dL (ref <200)
HDL Chol Piccolo, Waived: 51 mg/dL — ABNORMAL LOW (ref >59)
LDL CHOL CALC PICCOLO WAIVED: 90 mg/dL (ref <100)
TRIGLYCERIDES PICCOLO,WAIVED: 229 mg/dL — AB (ref <150)
VLDL CHOL CALC PICCOLO,WAIVE: 46 mg/dL — AB (ref <30)

## 2016-08-11 LAB — BAYER DCA HB A1C WAIVED: HB A1C (BAYER DCA - WAIVED): 5.9 % (ref <7.0)

## 2016-08-11 MED ORDER — ATORVASTATIN CALCIUM 20 MG PO TABS: 20.0000 mg | ORAL_TABLET | Freq: Every day | ORAL | 4 refills | Status: DC

## 2016-08-11 MED ORDER — TRAZODONE HCL 50 MG PO TABS: 25.0000 mg | ORAL_TABLET | Freq: Every evening | ORAL | 3 refills | Status: DC | PRN

## 2016-08-11 MED ORDER — PIOGLITAZONE HCL 30 MG PO TABS: 30.0000 mg | ORAL_TABLET | Freq: Every day | ORAL | 4 refills | Status: DC

## 2016-08-11 MED ORDER — METFORMIN HCL 500 MG PO TABS: 1000.0000 mg | ORAL_TABLET | Freq: Two times a day (BID) | ORAL | 4 refills | Status: DC

## 2016-08-11 MED ORDER — MELOXICAM 15 MG PO TABS: 15.0000 mg | ORAL_TABLET | Freq: Every day | ORAL | 4 refills | Status: DC

## 2016-08-11 MED ORDER — LISINOPRIL 10 MG PO TABS: 10.0000 mg | ORAL_TABLET | Freq: Every day | ORAL | 4 refills | Status: DC

## 2016-08-11 MED ORDER — OMEPRAZOLE 20 MG PO CPDR: 20.0000 mg | DELAYED_RELEASE_CAPSULE | Freq: Every day | ORAL | 4 refills | Status: DC

## 2016-08-11 MED ORDER — SULFAMETHOXAZOLE-TRIMETHOPRIM 800-160 MG PO TABS: 1.0000 | ORAL_TABLET | Freq: Two times a day (BID) | ORAL | 4 refills | Status: DC

## 2016-08-11 MED ORDER — FLUOXETINE HCL 20 MG PO TABS: 20.0000 mg | ORAL_TABLET | Freq: Every day | ORAL | 1 refills | Status: DC

## 2016-08-11 NOTE — Progress Notes (Signed)
BP 121/82   Pulse (!) 54   Ht 5\' 7"  (1.702 m)   Wt 221 lb (100.2 kg)   SpO2 99%   BMI 34.61 kg/m    Subjective:    Patient ID: Stacey Gross, female    DOB: 1960-07-28, 56 y.o.   MRN: [id number]  HPI: Stacey Gross is a 56 y.o. female  Chief Complaint  Patient presents with  . Follow-up  . Hyperlipidemia  . Diabetes   Patient follow-up having complaints of marked fatigue mostly secondary to poor sleep wakes up frequently due to leg cramping and spasms. Not really restless legs but leg cramps. Due to inactivity patient's appetite has increased and consequently has gained weight. Continued with medications blood pressures doing well hasn't tried any medication for her legs with Tylenol or other type massage stretching prior to bed. Diabetes noted low blood sugar spells no issues with medications. Blood pressure doing well with medications no complaints or problems.  Relevant past medical, surgical, family and social history reviewed and updated as indicated. Interim medical history since our last visit reviewed. Allergies and medications reviewed and updated.  Review of Systems  Constitutional: Negative.   Respiratory: Negative.   Cardiovascular: Negative.     Per HPI unless specifically indicated above     Objective:    BP 121/82   Pulse (!) 54   Ht 5\' 7"  (1.702 m)   Wt 221 lb (100.2 kg)   SpO2 99%   BMI 34.61 kg/m   Wt Readings from Last 3 Encounters:  08/11/16 221 lb (100.2 kg)  05/11/16 211 lb 9.6 oz (96 kg)  05/06/16 209 lb (94.8 kg)    Physical Exam  Constitutional: She is oriented to person, place, and time. She appears well-developed and well-nourished.  HENT:  Head: Normocephalic and atraumatic.  Eyes: Conjunctivae and EOM are normal.  Neck: Normal range of motion.  Cardiovascular: Normal rate, regular rhythm and normal heart sounds.   Pulmonary/Chest: Effort normal and breath sounds normal.  Musculoskeletal: Normal range of motion.    Neurological: She is alert and oriented to person, place, and time.  Skin: No erythema.  Psychiatric: She has a normal mood and affect. Her behavior is normal. Judgment and thought content normal.    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 Patient's going to continue smoking so that is not an option. Reviewed other options including stretching and massage. Will try Flexeril at bedtime. We'll also hold Lipitor for 2 weeks then restart and observe symptoms.      Relevant Medications   atorvastatin (LIPITOR) 20 MG tablet   lisinopril (PRINIVIL,ZESTRIL) 10 MG tablet     Digestive   Acid reflux   Relevant Medications   omeprazole (PRILOSEC) 20 MG capsule     Endocrine   Diabetes ([place])    The current medical regimen is effective;  continue present plan and medications.       Relevant Medications   atorvastatin (LIPITOR) 20 MG tablet   lisinopril (PRINIVIL,ZESTRIL) 10 MG tablet   metFORMIN (GLUCOPHAGE) 500 MG tablet   pioglitazone (ACTOS) 30 MG tablet   Other Relevant Orders   Basic metabolic panel   Bayer DCA Hb A1c Waived   LP+ALT+AST Piccolo, Waived     Other   Hyperlipidemia    Will hold atorvastatin to see if causing leg symptoms      Relevant Medications   atorvastatin (LIPITOR) 20 MG tablet   lisinopril (PRINIVIL,ZESTRIL) 10 MG tablet   Other Relevant Orders   Basic metabolic panel   Bayer DCA Hb A1c Waived   LP+ALT+AST Piccolo, Waived    Depression    Patient with recurrence of depression will start Prozac as does not want Cymbalta has taken that before and did not seem to work effectively.      Relevant Medications   FLUoxetine (PROZAC) 20 MG tablet   traZODone (DESYREL) 50 MG tablet    Other Visit Diagnoses    Infection/inflammation due to internal orthopedic device/implant/graft, subsequent encounter       Relevant Medications   meloxicam (MOBIC) 15 MG tablet   sulfamethoxazole-trimethoprim (BACTRIM DS,SEPTRA DS) 800-160 MG tablet       Follow up plan: Return in about 2 weeks (around 08/25/2016) for Recheck leg cramps and depression.

## 2016-08-11 NOTE — Assessment & Plan Note (Signed)
The current medical regimen is effective;  continue present plan and medications.  

## 2016-08-11 NOTE — Assessment & Plan Note (Signed)
Reviewed foot leg cramps and circulation issues. Patient's going to continue smoking so that is not an option. Reviewed other options including stretching and massage. Will try Flexeril at bedtime. We'll also hold Lipitor for 2 weeks then restart and observe symptoms.

## 2016-08-11 NOTE — Assessment & Plan Note (Signed)
Will hold atorvastatin to see if causing leg symptoms

## 2016-08-11 NOTE — Assessment & Plan Note (Signed)
Patient with recurrence of depression will start Prozac as does not want Cymbalta has taken that before and did not seem to work effectively.

## 2016-08-12 LAB — BASIC METABOLIC PANEL
BUN/Creatinine Ratio: 20 (ref 9–23)
BUN: 21 mg/dL (ref 6–24)
CALCIUM: 9.5 mg/dL (ref 8.7–10.2)
CO2: 23 mmol/L (ref 18–29)
Chloride: 95 mmol/L — ABNORMAL LOW (ref 96–106)
Creatinine, Ser: 1.05 mg/dL — ABNORMAL HIGH (ref 0.57–1.00)
GFR calc Af Amer: 68 mL/min/{1.73_m2} (ref >59)
GFR calc non Af Amer: 59 mL/min/{1.73_m2} — ABNORMAL LOW (ref >59)
GLUCOSE: 143 mg/dL — AB (ref 65–99)
POTASSIUM: 4.7 mmol/L (ref 3.5–5.2)
Sodium: 135 mmol/L (ref 134–144)

## 2016-08-15 ENCOUNTER — Encounter: Payer: Self-pay | Admitting: Family Medicine

## 2016-09-13 ENCOUNTER — Ambulatory Visit (INDEPENDENT_AMBULATORY_CARE_PROVIDER_SITE_OTHER): Payer: Medicare Other | Admitting: Family Medicine

## 2016-09-13 ENCOUNTER — Encounter: Payer: Self-pay | Admitting: Family Medicine

## 2016-09-13 VITALS — BP 119/79 | HR 70 | Ht 67.0 in | Wt 222.0 lb

## 2016-09-13 DIAGNOSIS — E119 Type 2 diabetes mellitus without complications: Secondary | ICD-10-CM

## 2016-09-13 DIAGNOSIS — I70213 Atherosclerosis of native arteries of extremities with intermittent claudication, bilateral legs: Secondary | ICD-10-CM

## 2016-09-13 DIAGNOSIS — F331 Major depressive disorder, recurrent, moderate: Secondary | ICD-10-CM | POA: Diagnosis not present

## 2016-09-13 MED ORDER — GLUCOSE BLOOD VI STRP: ORAL_STRIP | 12 refills | Status: DC

## 2016-09-13 MED ORDER — FLUOXETINE HCL 20 MG PO TABS: 20.0000 mg | ORAL_TABLET | Freq: Every day | ORAL | 4 refills | Status: DC

## 2016-09-13 MED ORDER — CYCLOBENZAPRINE HCL 10 MG PO TABS
10.0000 mg | ORAL_TABLET | Freq: Every day | ORAL | 3 refills | Status: DC
Start: 2016-09-13 — End: 2017-10-04

## 2016-09-13 NOTE — Progress Notes (Signed)
BP 119/79   Pulse 70   Ht 5\' 7"  (1.702 m)   Wt 222 lb (100.7 kg)   SpO2 98%   BMI 34.77 kg/m    Subjective:    Patient ID: Stacey Gross, female    DOB: 1960/05/30, 56 y.o.   MRN: [id number]  HPI: Stacey Gross is a 56 y.o. female  Patient follow-up for multiple issues. Depression somewhat better doesn't feel like slapping everybody. Taking medicines without side effects so will continue. Sleep trazodone helped for a few nights and then is quit helping. Still getting up a lot to urinate. Discuss hydration fluid water and using the bathroom at night patient will try to cut back. This may help her leg cramps since waking her at night also. Has tried massage didn't really help any. Did not get the Flexeril prescription as we discussed ongoing ahead and give a prescription for that.  Relevant past medical, surgical, family and social history reviewed and updated as indicated. Interim medical history since our last visit reviewed. Allergies and medications reviewed and updated.  Review of Systems  Constitutional: Negative.   Respiratory: Negative.   Cardiovascular: Negative.     Per HPI unless specifically indicated above     Objective:    BP 119/79   Pulse 70   Ht 5\' 7"  (3.818 m)   Wt 222 lb (100.7 kg)   SpO2 98%   BMI 34.77 kg/m   Wt Readings from Last 3 Encounters:  09/13/16 222 lb (100.7 kg)  08/11/16 221 lb (100.2 kg)  05/11/16 211 lb 9.6 oz (96 kg)    Physical Exam  Constitutional: She is oriented to person, place, and time. She appears well-developed and well-nourished.  HENT:  Head: Normocephalic and atraumatic.  Eyes: Conjunctivae and EOM are normal.  Neck: Normal range of motion.  Cardiovascular: Normal rate, regular rhythm and normal heart sounds.   Pulmonary/Chest: Effort normal and breath sounds normal.  Musculoskeletal: Normal range of motion.  Neurological: She is alert and oriented to person, place, and time.  Skin: No erythema.  Psychiatric:  She has a normal mood and affect. Her behavior is normal. Judgment and thought content normal.    Results for orders placed or performed in visit on 29/93/71  Basic metabolic panel  Result Value Ref Range   Glucose 143 (H) 65 - 99 mg/dL   BUN 21 6 - 24 mg/dL   Creatinine, Ser 1.05 (H) 0.57 - 1.00 mg/dL   GFR calc non Af Amer 59 (L) >59 mL/min/1.73   GFR calc Af Amer 68 >59 mL/min/1.73   BUN/Creatinine Ratio 20 9 - 23   Sodium 135 134 - 144 mmol/L   Potassium 4.7 3.5 - 5.2 mmol/L   Chloride 95 (L) 96 - 106 mmol/L   CO2 23 18 - 29 mmol/L   Calcium 9.5 8.7 - 10.2 mg/dL  Bayer DCA Hb A1c Waived  Result Value Ref Range   Bayer DCA Hb A1c Waived 5.9 <7.0 %  LP+ALT+AST Piccolo, Waived  Result Value Ref Range   ALT (SGPT) Piccolo, Waived 23 10 - 47 U/L   AST (SGOT) Piccolo, Waived 23 11 - 38 U/L   Cholesterol Piccolo, Waived 187 <200 mg/dL   HDL Chol Piccolo, Waived 51 (L) >59 mg/dL   Triglycerides Piccolo,Waived 229 (H) <150 mg/dL   Chol/HDL Ratio Piccolo,Waive 3.7 mg/dL   LDL Chol Calc Piccolo Waived 90 <100 mg/dL   VLDL Chol Calc Piccolo,Waive 46 (H) <30 mg/dL  Assessment & Plan:   Problem List Items Addressed This Visit      Endocrine   Diabetes ([place]) - Primary     Other   Depression   Relevant Medications   FLUoxetine (PROZAC) 20 MG tablet       Follow up plan: Return in about 2 months (around 11/13/2016) for Hemoglobin A1c.

## 2016-09-13 NOTE — Assessment & Plan Note (Signed)
Legs not really any better will try Flexeril as patient did not give prescription from last time.

## 2016-09-13 NOTE — Assessment & Plan Note (Signed)
The current medical regimen is effective;  continue present plan and medications.  

## 2016-11-14 ENCOUNTER — Ambulatory Visit (INDEPENDENT_AMBULATORY_CARE_PROVIDER_SITE_OTHER): Payer: Medicare Other | Admitting: Family Medicine

## 2016-11-14 ENCOUNTER — Other Ambulatory Visit: Payer: Self-pay | Admitting: Family Medicine

## 2016-11-14 ENCOUNTER — Encounter: Payer: Self-pay | Admitting: Family Medicine

## 2016-11-14 VITALS — BP 112/73 | HR 105 | Wt 218.0 lb

## 2016-11-14 DIAGNOSIS — R591 Generalized enlarged lymph nodes: Secondary | ICD-10-CM | POA: Diagnosis not present

## 2016-11-14 DIAGNOSIS — E119 Type 2 diabetes mellitus without complications: Secondary | ICD-10-CM

## 2016-11-14 DIAGNOSIS — I10 Essential (primary) hypertension: Secondary | ICD-10-CM

## 2016-11-14 DIAGNOSIS — Z114 Encounter for screening for human immunodeficiency virus [HIV]: Secondary | ICD-10-CM | POA: Diagnosis not present

## 2016-11-14 DIAGNOSIS — Z1159 Encounter for screening for other viral diseases: Secondary | ICD-10-CM | POA: Diagnosis not present

## 2016-11-14 NOTE — Progress Notes (Signed)
BP 112/73   Pulse (!) 105   Wt 218 lb (98.9 kg)   SpO2 96%   BMI 34.14 kg/m    Subjective:    Patient ID: Stacey Gross, female    DOB: 07-29-1960, 56 y.o.   MRN: [id number]  HPI: Stacey Gross is a 56 y.o. female  Chief Complaint  Patient presents with  . Follow-up  . Diabetes  . Neck Pain    x 2 weeks.    Patient with no complaints from diabetes noted low blood sugar spells or issues with medications having good control.  Patient of especially concern today is left neck area has noticed some slight nodularity with marked discomfort in that area especially with palpation of the nodules. This radiates into her left shoulder area and is painful with looking to the left This is been ongoing for 2+ weeks no known specific trauma or irritation to this area. Relevant past medical, surgical, family and social history reviewed and updated as indicated. Interim medical history since our last visit reviewed. Allergies and medications reviewed and updated.  Review of Systems  Constitutional: Negative.   HENT: Negative for ear pain, facial swelling, hearing loss, rhinorrhea, sinus pain, sore throat, trouble swallowing and voice change.   Respiratory: Negative.  Negative for apnea, choking and chest tightness.   Cardiovascular: Negative.     Per HPI unless specifically indicated above     Objective:    BP 112/73   Pulse (!) 105   Wt 218 lb (98.9 kg)   SpO2 96%   BMI 34.14 kg/m   Wt Readings from Last 3 Encounters:  11/14/16 218 lb (98.9 kg)  09/13/16 222 lb (100.7 kg)  08/11/16 221 lb (100.2 kg)    Physical Exam  Constitutional: She is oriented to person, place, and time. She appears well-developed and well-nourished.  HENT:  Head: Normocephalic and atraumatic.  Eyes: Conjunctivae and EOM are normal.  Neck: Normal range of motion.  Left neck area of nodularity and tenderness palpated no distinct nodules palpated more like a thickening.  Cardiovascular: Normal rate,  regular rhythm and normal heart sounds.   Pulmonary/Chest: Effort normal and breath sounds normal.  Musculoskeletal: Normal range of motion.  Neurological: She is alert and oriented to person, place, and time.  Skin: No erythema.  Psychiatric: She has a normal mood and affect. Her behavior is normal. Judgment and thought content normal.    Results for orders placed or performed in visit on 54/65/03  Basic metabolic panel  Result Value Ref Range   Glucose 143 (H) 65 - 99 mg/dL   BUN 21 6 - 24 mg/dL   Creatinine, Ser 1.05 (H) 0.57 - 1.00 mg/dL   GFR calc non Af Amer 59 (L) >59 mL/min/1.73   GFR calc Af Amer 68 >59 mL/min/1.73   BUN/Creatinine Ratio 20 9 - 23   Sodium 135 134 - 144 mmol/L   Potassium 4.7 3.5 - 5.2 mmol/L   Chloride 95 (L) 96 - 106 mmol/L   CO2 23 18 - 29 mmol/L   Calcium 9.5 8.7 - 10.2 mg/dL  Bayer DCA Hb A1c Waived  Result Value Ref Range   Bayer DCA Hb A1c Waived 5.9 <7.0 %  LP+ALT+AST Piccolo, Waived  Result Value Ref Range   ALT (SGPT) Piccolo, Waived 23 10 - 47 U/L   AST (SGOT) Piccolo, Waived 23 11 - 38 U/L   Cholesterol Piccolo, Waived 187 <200 mg/dL   HDL Chol Piccolo, Waived 51 (  L) >59 mg/dL   Triglycerides Piccolo,Waived 229 (H) <150 mg/dL   Chol/HDL Ratio Piccolo,Waive 3.7 mg/dL   LDL Chol Calc Piccolo Waived 90 <100 mg/dL   VLDL Chol Calc Piccolo,Waive 46 (H) <30 mg/dL      Assessment & Plan:   Problem List Items Addressed This Visit      Cardiovascular and Mediastinum   Hypertension    The current medical regimen is effective;  continue present plan and medications.         Endocrine   Diabetes ([place]) - Primary    The current medical regimen is effective;  continue present plan and medications.       Relevant Orders   Bayer DCA Hb A1c Waived     Immune and Lymphatic   Head and neck lymphadenopathy    With nonspecific neck symptoms will refer to ear nose and throat to further evaluate.       Other Visit Diagnoses    Need for  hepatitis C screening test       Encounter for screening for HIV           Follow up plan: Return in about 3 months (around 02/14/2017) for Hemoglobin A1c, BMP,  Lipids, ALT, AST.

## 2016-11-14 NOTE — Assessment & Plan Note (Signed)
The current medical regimen is effective;  continue present plan and medications.  

## 2016-11-14 NOTE — Assessment & Plan Note (Signed)
With nonspecific neck symptoms will refer to ear nose and throat to further evaluate.

## 2016-11-15 LAB — BAYER DCA HB A1C WAIVED: HB A1C (BAYER DCA - WAIVED): 5.6 % (ref <7.0)

## 2016-12-08 ENCOUNTER — Other Ambulatory Visit: Payer: Self-pay | Admitting: Family Medicine

## 2016-12-08 DIAGNOSIS — F331 Major depressive disorder, recurrent, moderate: Secondary | ICD-10-CM

## 2016-12-09 ENCOUNTER — Telehealth: Payer: Self-pay | Admitting: Family Medicine

## 2016-12-09 NOTE — Telephone Encounter (Signed)
Form filled out, signed by Dr. Wynetta Emery, and faxed to [facility].

## 2016-12-09 NOTE — Telephone Encounter (Signed)
Patient needs new script for the Accu check aviva , test strips, lancets and accu check.  Please sent to [facility] at [facility] as soon as this can be done.  The call came in for this yesterday and was not forwarded to the provider.  Sorry about that I will let the pharmacist know it was not forwarded to the provider/CMA  Thank Gentry Fitz [phone]

## 2016-12-28 ENCOUNTER — Ambulatory Visit (INDEPENDENT_AMBULATORY_CARE_PROVIDER_SITE_OTHER): Payer: Medicare Other | Admitting: Family Medicine

## 2016-12-28 ENCOUNTER — Encounter: Payer: Self-pay | Admitting: Family Medicine

## 2016-12-28 VITALS — BP 132/74 | HR 75 | Temp 98.4°F | Wt 217.0 lb

## 2016-12-28 DIAGNOSIS — T847XXD Infection and inflammatory reaction due to other internal orthopedic prosthetic devices, implants and grafts, subsequent encounter: Secondary | ICD-10-CM | POA: Diagnosis not present

## 2016-12-28 DIAGNOSIS — J019 Acute sinusitis, unspecified: Secondary | ICD-10-CM | POA: Diagnosis not present

## 2016-12-28 MED ORDER — DOXYCYCLINE HYCLATE 100 MG PO TABS: 100.0000 mg | ORAL_TABLET | Freq: Two times a day (BID) | ORAL | 0 refills | Status: DC

## 2016-12-28 NOTE — Progress Notes (Signed)
   BP 132/74   Pulse 75   Temp 98.4 F (36.9 C) (Oral)   Wt 217 lb (98.4 kg)   SpO2 93%   BMI 33.99 kg/m    Subjective:    Patient ID: Stacey Gross, female    DOB: Sep 18, 1960, 56 y.o.   MRN: [id number]  HPI: Stacey Gross is a 56 y.o. female  Chief Complaint  Patient presents with  . Cough    Mostly Dry cough.   . Chest Pain  Patient with getting worse sinus congestion drainage feeling bad cough and low-grade fever achiness and is been ongoing for 5 days and getting worse. Patient's tried over-the-counter medications without any real relief. Patient also has been taking some leftover amoxicillin one a day. With no effect. Relevant past medical, surgical, family and social history reviewed and updated as indicated. Interim medical history since our last visit reviewed. Allergies and medications reviewed and updated.  Review of Systems  Constitutional: Negative.   Respiratory: Negative.   Cardiovascular: Negative.     Per HPI unless specifically indicated above     Objective:    BP 132/74   Pulse 75   Temp 98.4 F (36.9 C) (Oral)   Wt 217 lb (98.4 kg)   SpO2 93%   BMI 33.99 kg/m   Wt Readings from Last 3 Encounters:  12/28/16 217 lb (98.4 kg)  11/14/16 218 lb (98.9 kg)  09/13/16 222 lb (100.7 kg)    Physical Exam  Constitutional: She is oriented to person, place, and time. She appears well-developed and well-nourished.  HENT:  Head: Normocephalic and atraumatic.  Right Ear: External ear normal.  Left Ear: External ear normal.  Mouth/Throat: Oropharynx is clear and moist.  Eyes: Conjunctivae and EOM are normal.  Neck: Normal range of motion.  Cardiovascular: Normal rate, regular rhythm and normal heart sounds.   Pulmonary/Chest: Effort normal and breath sounds normal.  Musculoskeletal: Normal range of motion.  Lymphadenopathy:    She has no cervical adenopathy.  Neurological: She is alert and oriented to person, place, and time.  Skin: No erythema.    Psychiatric: She has a normal mood and affect. Her behavior is normal. Judgment and thought content normal.    Results for orders placed or performed in visit on 11/14/16  Bayer DCA Hb A1c Waived  Result Value Ref Range   Bayer DCA Hb A1c Waived 5.6 <7.0 %      Assessment & Plan:   Problem List Items Addressed This Visit      Respiratory   Acute sinusitis - Primary   Relevant Medications   doxycycline (VIBRA-TABS) 100 MG tablet     Musculoskeletal and Integument   Infection/inflammation due to internal orthopedic device/implant/graft ([place])     Discussed sinusitis care and treatment not using leftover antibiotics unless at full dose. 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 We'll use doxycycline 100 mg twice a day. Discuss other over-the-counter medications for cough cold care and treatment of Mucinex Tylenol sinus nasal rinse etc.  Follow up plan: Return for As scheduled.

## 2017-01-16 ENCOUNTER — Ambulatory Visit: Payer: Medicare Other

## 2017-01-25 ENCOUNTER — Ambulatory Visit (INDEPENDENT_AMBULATORY_CARE_PROVIDER_SITE_OTHER): Payer: Medicare Other

## 2017-01-25 VITALS — BP 116/75 | HR 93 | Temp 98.3°F | Resp 18 | Ht 67.0 in | Wt 220.4 lb

## 2017-01-25 DIAGNOSIS — Z0000 Encounter for general adult medical examination without abnormal findings: Secondary | ICD-10-CM

## 2017-01-25 NOTE — Progress Notes (Signed)
Subjective:   Stacey Gross is a 56 y.o. female who presents for Medicare Annual (Subsequent) preventive examination.  Review of Systems:  Cardiac Risk Factors include: advanced age (>68mn, >>78women);hypertension;diabetes mellitus;dyslipidemia;smoking/ tobacco exposure;obesity (BMI >30kg/m2)     Objective:     Vitals: BP 116/75 (BP Location: Left Arm, Patient Position: Sitting)   Pulse 93   Temp 98.3 F (36.8 C) (Oral)   Resp 18   Ht 5' 7"  (1.702 m)   Wt 220 lb 6.4 oz (100 kg)   BMI 34.52 kg/m   Body mass index is 34.52 kg/m.   Tobacco History  Smoking Status  . Current Every Day Smoker  . Packs/day: 1.00  . Years: 43.00  . Types: Cigarettes  Smokeless Tobacco  . Never Used     Ready to quit: No Counseling given: Yes   Past Medical History:  Diagnosis Date  . Chronic back pain   . Degenerative joint disease (DJD) of hip   . Diabetes mellitus without complication ([place]   . GERD (gastroesophageal reflux disease)    takes Omeprazole daily  . History of bronchitis yrs ago  . History of colon polyps    benign  . History of staph infection 10 yrs ago  . Hyperlipidemia    takes Atorvastatin daily  . Hypertension   . Peripheral vascular disease ([place]   . Tobacco use    0ne pack of cigaretts daily  . Weakness    numbness and tingling in both legs    Past Surgical History:  Procedure Laterality Date  . ABDOMINAL HYSTERECTOMY     total  . APPENDECTOMY    . BACK SURGERY  9-10 yrs ago  . COLONOSCOPY    . PERIPHERAL VASCULAR CATHETERIZATION Left 03/17/2016   Procedure: Lower Extremity Angiography;  Surgeon: JAlgernon Huxley MD;  [facility];  Service: Cardiovascular;  Laterality: Left;   Family History  Problem Relation Age of Onset  . Hyperlipidemia Mother   . Hyperlipidemia Father   . Melanoma Sister    History  Sexual Activity  . Sexual activity: Not on file    Outpatient Encounter Prescriptions as of 01/25/2017  Medication Sig   . atorvastatin (LIPITOR) 20 MG tablet Take 1 tablet (20 mg total) by mouth at bedtime.  . Blood Glucose Monitoring Suppl (ONE TOUCH ULTRA SYSTEM KIT) W/DEVICE KIT 1 kit by Does not apply route once. (Patient taking differently: 1 kit by Does not apply route daily. )  . cyclobenzaprine (FLEXERIL) 10 MG tablet Take 1 tablet (10 mg total) by mouth at bedtime.  .Marland KitchenFLUoxetine (PROZAC) 20 MG tablet Take 1 tablet (20 mg total) by mouth daily.  .Marland Kitchenglucose blood test strip Use as instructed  . lisinopril (PRINIVIL,ZESTRIL) 10 MG tablet Take 1 tablet (10 mg total) by mouth daily.  . meloxicam (MOBIC) 15 MG tablet Take 1 tablet (15 mg total) by mouth daily.  . metFORMIN (GLUCOPHAGE) 500 MG tablet Take 2 tablets (1,000 mg total) by mouth 2 (two) times daily.  .Marland Kitchenomeprazole (PRILOSEC) 20 MG capsule Take 1 capsule (20 mg total) by mouth daily.  . pioglitazone (ACTOS) 30 MG tablet Take 1 tablet (30 mg total) by mouth daily.  . traZODone (DESYREL) 50 MG tablet TAKE ONE-HALF TO ONE TABLET BY MOUTH AT BEDTIME AS NEEDED FOR SLEEP  . [DISCONTINUED] doxycycline (VIBRA-TABS) 100 MG tablet Take 1 tablet (100 mg total) by mouth 2 (two) times daily. (Patient not taking: Reported on 01/25/2017)  No facility-administered encounter medications on file as of 01/25/2017.     Activities of Daily Living In your present state of health, do you have any difficulty performing the following activities: 01/25/2017  Hearing? N  Vision? N  Difficulty concentrating or making decisions? N  Walking or climbing stairs? Y  Comment d/t back surgery   Dressing or bathing? N  Doing errands, shopping? N  Preparing Food and eating ? N  Using the Toilet? N  In the past six months, have you accidently leaked urine? N  Do you have problems with loss of bowel control? N  Managing your Medications? N  Managing your Finances? N  Housekeeping or managing your Housekeeping? N  Some recent data might be hidden    Patient Care  Team: Guadalupe Maple, MD as PCP - General (Family Medicine) Guadalupe Maple, MD (Family Medicine)    Assessment:     Exercise Activities and Dietary recommendations Current Exercise Habits: The patient does not participate in regular exercise at present, Exercise limited by: orthopedic condition(s) (back pain )  Goals    . Quit smoking / using tobacco          Smoking cessation discussed       Fall Risk Fall Risk  01/25/2017 11/14/2016 08/11/2016 05/11/2016 09/18/2013  Falls in the past year? No No Yes No No  Number falls in past yr: - - 1 - -  Injury with Fall? - - No - -  Risk for fall due to : - - - - -  Risk for fall due to: Comment - - - - -  Follow up - - Falls evaluation completed - -   Depression Screen PHQ 2/9 Scores 01/25/2017 11/14/2016 08/11/2016 02/09/2016  PHQ - 2 Score 0 0 0 0  PHQ- 9 Score 0 - - -     Cognitive Function     6CIT Screen 01/25/2017  What Year? 0 points  What month? 0 points  What time? 0 points  Count back from 20 0 points  Months in reverse 0 points  Repeat phrase 0 points  Total Score 0    Immunization History  Administered Date(s) Administered  . Influenza,inj,Quad PF,6+ Mos 02/09/2016  . Pneumococcal Polysaccharide-23 02/09/2016  . Td 04/04/2004, 05/13/2014   Screening Tests Health Maintenance  Topic Date Due  . INFLUENZA VACCINE  02/21/2017 (Originally 11/02/2016)  . MAMMOGRAM  01/31/2018 (Originally 11/19/2015)  . OPHTHALMOLOGY EXAM  01/31/2018 (Originally 03/15/2015)  . Hepatitis C Screening  01/31/2018 (Originally [birth year])  . HIV Screening  01/31/2018 (Originally 05/06/1973)  . HEMOGLOBIN A1C  05/17/2017  . FOOT EXAM  08/11/2017  . PNEUMOCOCCAL POLYSACCHARIDE VACCINE (2) 02/08/2021  . COLONOSCOPY  07/11/2022  . TETANUS/TDAP  05/13/2024      Plan:     I have personally reviewed and addressed the Medicare Annual Wellness questionnaire and have noted the following in the patient's chart:  A. Medical and social  history B. Use of alcohol, tobacco or illicit drugs  C. Current medications and supplements D. Functional ability and status E.  Nutritional status F.  Physical activity G. Advance directives H. List of other physicians I.  Hospitalizations, surgeries, and ER visits in previous 12 months J.  [place] such as hearing and vision if needed, cognitive and depression L. Referrals and appointments  In addition, I have reviewed and discussed with patient certain preventive protocols, quality metrics, and best practice recommendations. A written personalized care plan for preventive services as  well as general preventive health recommendations were provided to patient.   Signed,  Tyler Aas, LPN Nurse Health Advisor   MD Recommendations: Patient states her chest cold is not better after abx treatment. She has also tried mucinex as needed. Advised to call and make an appointment if needed.

## 2017-01-25 NOTE — Patient Instructions (Signed)
Stacey Gross , Thank you for taking time to come for your Medicare Wellness Visit. I appreciate your ongoing commitment to your health goals. Please review the following plan we discussed and let me know if I can assist you in the future.   Screening recommendations/referrals: Colonoscopy: completed 07/10/2012 Mammogram: completed 11/18/2013- declined Bone Density: completed 11/07/2013 Recommended yearly ophthalmology/optometry visit for glaucoma screening and checkup Recommended yearly dental visit for hygiene and checkup  Vaccinations: Influenza vaccine: due, ill today Pneumococcal vaccine:due at age 45 Tdap vaccine: up to date Shingles vaccine: due, check with your insurance company for coverage   Advanced directives: Please bring a copy of your health care power of attorney and living will to the office at your convenience.  Conditions/risks identified: Smoking cessation discussed   Next appointment: Follow up on 02/22/2017 at 8:45am with Dr.Crissman. Follow up in one year for your annual wellness exam.   Preventive Care 40-64 Years, Female Preventive care refers to lifestyle choices and visits with your health care provider that can promote health and wellness. What does preventive care include?  A yearly physical exam. This is also called an annual well check.  Dental exams once or twice a year.  Routine eye exams. Ask your health care provider how often you should have your eyes checked.  Personal lifestyle choices, including:  Daily care of your teeth and gums.  Regular physical activity.  Eating a healthy diet.  Avoiding tobacco and drug use.  Limiting alcohol use.  Practicing safe sex.  Taking low-dose aspirin daily starting at age 70.  Taking vitamin and mineral supplements as recommended by your health care provider. What happens during an annual well check? The services and screenings done by your health care provider during your annual well check will depend  on your age, overall health, lifestyle risk factors, and family history of disease. Counseling  Your health care provider may ask you questions about your:  Alcohol use.  Tobacco use.  Drug use.  Emotional well-being.  Home and relationship well-being.  Sexual activity.  Eating habits.  Work and work Statistician.  Method of birth control.  Menstrual cycle.  Pregnancy history. Screening  You may have the following tests or measurements:  Height, weight, and BMI.  Blood pressure.  Lipid and cholesterol levels. These may be checked every 5 years, or more frequently if you are over 41 years old.  Skin check.  Lung cancer screening. You may have this screening every year starting at age 13 if you have a 30-pack-year history of smoking and currently smoke or have quit within the past 15 years.  Fecal occult blood test (FOBT) of the stool. You may have this test every year starting at age 51.  Flexible sigmoidoscopy or colonoscopy. You may have a sigmoidoscopy every 5 years or a colonoscopy every 10 years starting at age 50.  Hepatitis C blood test.  Hepatitis B blood test.  Sexually transmitted disease (STD) testing.  Diabetes screening. This is done by checking your blood sugar (glucose) after you have not eaten for a while (fasting). You may have this done every 1-3 years.  Mammogram. This may be done every 1-2 years. Talk to your health care provider about when you should start having regular mammograms. This may depend on whether you have a family history of breast cancer.  BRCA-related cancer screening. This may be done if you have a family history of breast, ovarian, tubal, or peritoneal cancers.  Pelvic exam and Pap test. This may  be done every 3 years starting at age 81. Starting at age 63, this may be done every 5 years if you have a Pap test in combination with an HPV test.  Bone density scan. This is done to screen for osteoporosis. You may have this scan  if you are at high risk for osteoporosis. Discuss your test results, treatment options, and if necessary, the need for more tests with your health care provider. Vaccines  Your health care provider may recommend certain vaccines, such as:  Influenza vaccine. This is recommended every year.  Tetanus, diphtheria, and acellular pertussis (Tdap, Td) vaccine. You may need a Td booster every 10 years.  Zoster vaccine. You may need this after age 86.  Pneumococcal 13-valent conjugate (PCV13) vaccine. You may need this if you have certain conditions and were not previously vaccinated.  Pneumococcal polysaccharide (PPSV23) vaccine. You may need one or two doses if you smoke cigarettes or if you have certain conditions. Talk to your health care provider about which screenings and vaccines you need and how often you need them. This information is not intended to replace advice given to you by your health care provider. Make sure you discuss any questions you have with your health care provider. Document Released: 04/17/2015 Document Revised: 12/09/2015 Document Reviewed: 01/20/2015 Elsevier Interactive Patient Education  2017 [place] Prevention in the Home Falls can cause injuries. They can happen to people of all ages. There are many things you can do to make your home safe and to help prevent falls. What can I do on the outside of my home?  Regularly fix the edges of walkways and driveways and fix any cracks.  Remove anything that might make you trip as you walk through a door, such as a raised step or threshold.  Trim any bushes or trees on the path to your home.  Use bright outdoor lighting.  Clear any walking paths of anything that might make someone trip, such as rocks or tools.  Regularly check to see if handrails are loose or broken. Make sure that both sides of any steps have handrails.  Any raised decks and porches should have guardrails on the edges.  Have any  leaves, snow, or ice cleared regularly.  Use sand or salt on walking paths during winter.  Clean up any spills in your garage right away. This includes oil or grease spills. What can I do in the bathroom?  Use night lights.  Install grab bars by the toilet and in the tub and shower. Do not use towel bars as grab bars.  Use non-skid mats or decals in the tub or shower.  If you need to sit down in the shower, use a plastic, non-slip stool.  Keep the floor dry. Clean up any water that spills on the floor as soon as it happens.  Remove soap buildup in the tub or shower regularly.  Attach bath mats securely with double-sided non-slip rug tape.  Do not have throw rugs and other things on the floor that can make you trip. What can I do in the bedroom?  Use night lights.  Make sure that you have a light by your bed that is easy to reach.  Do not use any sheets or blankets that are too big for your bed. They should not hang down onto the floor.  Have a firm chair that has side arms. You can use this for support while you get dressed.  Do  not have throw rugs and other things on the floor that can make you trip. What can I do in the kitchen?  Clean up any spills right away.  Avoid walking on wet floors.  Keep items that you use a lot in easy-to-reach places.  If you need to reach something above you, use a strong step stool that has a grab bar.  Keep electrical cords out of the way.  Do not use floor polish or wax that makes floors slippery. If you must use wax, use non-skid floor wax.  Do not have throw rugs and other things on the floor that can make you trip. What can I do with my stairs?  Do not leave any items on the stairs.  Make sure that there are handrails on both sides of the stairs and use them. Fix handrails that are broken or loose. Make sure that handrails are as long as the stairways.  Check any carpeting to make sure that it is firmly attached to the stairs.  Fix any carpet that is loose or worn.  Avoid having throw rugs at the top or bottom of the stairs. If you do have throw rugs, attach them to the floor with carpet tape.  Make sure that you have a light switch at the top of the stairs and the bottom of the stairs. If you do not have them, ask someone to add them for you. What else can I do to help prevent falls?  Wear shoes that:  Do not have high heels.  Have rubber bottoms.  Are comfortable and fit you well.  Are closed at the toe. Do not wear sandals.  If you use a stepladder:  Make sure that it is fully opened. Do not climb a closed stepladder.  Make sure that both sides of the stepladder are locked into place.  Ask someone to hold it for you, if possible.  Clearly mark and make sure that you can see:  Any grab bars or handrails.  First and last steps.  Where the edge of each step is.  Use tools that help you move around (mobility aids) if they are needed. These include:  Canes.  Walkers.  Scooters.  Crutches.  Turn on the lights when you go into a dark area. Replace any light bulbs as soon as they burn out.  Set up your furniture so you have a clear path. Avoid moving your furniture around.  If any of your floors are uneven, fix them.  If there are any pets around you, be aware of where they are.  Review your medicines with your doctor. Some medicines can make you feel dizzy. This can increase your chance of falling. Ask your doctor what other things that you can do to help prevent falls. This information is not intended to replace advice given to you by your health care provider. Make sure you discuss any questions you have with your health care provider. Document Released: 01/15/2009 Document Revised: 08/27/2015 Document Reviewed: 04/25/2014 Elsevier Interactive Patient Education  2017 [employer].

## 2017-02-07 ENCOUNTER — Other Ambulatory Visit: Payer: Self-pay | Admitting: Family Medicine

## 2017-02-07 DIAGNOSIS — F331 Major depressive disorder, recurrent, moderate: Secondary | ICD-10-CM

## 2017-02-13 ENCOUNTER — Other Ambulatory Visit: Payer: Self-pay

## 2017-02-13 NOTE — Patient Outreach (Signed)
[facility]) Care Management  02/13/2017  Stacey Gross 08-05-1960 [id number]   Medication Adherence call to Mrs.Stacey Gross patient is showing past due under [facility] Ins.on Pioglitazone 30 mg and Metformin 500 mg spoke to patient she said she still has a few tablets ,she also said she will order this medication herself patient did not want any assistance.   [place] Management Direct Dial [phone]  Fax [phone] [email or web address]

## 2017-02-22 ENCOUNTER — Encounter: Payer: Self-pay | Admitting: Family Medicine

## 2017-02-22 ENCOUNTER — Ambulatory Visit (INDEPENDENT_AMBULATORY_CARE_PROVIDER_SITE_OTHER): Payer: Medicare Other | Admitting: Family Medicine

## 2017-02-22 VITALS — BP 114/79 | HR 73 | Wt 219.0 lb

## 2017-02-22 DIAGNOSIS — I10 Essential (primary) hypertension: Secondary | ICD-10-CM

## 2017-02-22 DIAGNOSIS — E119 Type 2 diabetes mellitus without complications: Secondary | ICD-10-CM

## 2017-02-22 DIAGNOSIS — E782 Mixed hyperlipidemia: Secondary | ICD-10-CM | POA: Diagnosis not present

## 2017-02-22 DIAGNOSIS — T847XXD Infection and inflammatory reaction due to other internal orthopedic prosthetic devices, implants and grafts, subsequent encounter: Secondary | ICD-10-CM | POA: Diagnosis not present

## 2017-02-22 DIAGNOSIS — Z7189 Other specified counseling: Secondary | ICD-10-CM

## 2017-02-22 DIAGNOSIS — Z23 Encounter for immunization: Secondary | ICD-10-CM | POA: Diagnosis not present

## 2017-02-22 DIAGNOSIS — M549 Dorsalgia, unspecified: Secondary | ICD-10-CM

## 2017-02-22 DIAGNOSIS — E7800 Pure hypercholesterolemia, unspecified: Secondary | ICD-10-CM

## 2017-02-22 DIAGNOSIS — G8918 Other acute postprocedural pain: Secondary | ICD-10-CM | POA: Diagnosis not present

## 2017-02-22 DIAGNOSIS — Z0000 Encounter for general adult medical examination without abnormal findings: Secondary | ICD-10-CM

## 2017-02-22 DIAGNOSIS — F331 Major depressive disorder, recurrent, moderate: Secondary | ICD-10-CM

## 2017-02-22 DIAGNOSIS — K219 Gastro-esophageal reflux disease without esophagitis: Secondary | ICD-10-CM

## 2017-02-22 DIAGNOSIS — Z0001 Encounter for general adult medical examination with abnormal findings: Secondary | ICD-10-CM | POA: Diagnosis not present

## 2017-02-22 LAB — URINALYSIS, ROUTINE W REFLEX MICROSCOPIC
Bilirubin, UA: NEGATIVE
Glucose, UA: NEGATIVE
Ketones, UA: NEGATIVE
Nitrite, UA: NEGATIVE
PH UA: 6.5 (ref 5.0–7.5)
PROTEIN UA: NEGATIVE
RBC UA: NEGATIVE
SPEC GRAV UA: 1.02 (ref 1.005–1.030)
Urobilinogen, Ur: 1 mg/dL (ref 0.2–1.0)

## 2017-02-22 LAB — LP+ALT+AST PICCOLO, WAIVED
ALT (SGPT) Piccolo, Waived: 19 U/L (ref 10–47)
AST (SGOT) Piccolo, Waived: 32 U/L (ref 11–38)
CHOL/HDL RATIO PICCOLO,WAIVE: 3.8 mg/dL
Cholesterol Piccolo, Waived: 194 mg/dL (ref <200)
HDL Chol Piccolo, Waived: 51 mg/dL — ABNORMAL LOW (ref >59)
LDL CHOL CALC PICCOLO WAIVED: 97 mg/dL (ref <100)
TRIGLYCERIDES PICCOLO,WAIVED: 227 mg/dL — AB (ref <150)
VLDL CHOL CALC PICCOLO,WAIVE: 45 mg/dL — AB (ref <30)

## 2017-02-22 LAB — MICROSCOPIC EXAMINATION

## 2017-02-22 LAB — BAYER DCA HB A1C WAIVED: HB A1C (BAYER DCA - WAIVED): 5.8 % (ref <7.0)

## 2017-02-22 MED ORDER — MELOXICAM 15 MG PO TABS: 15.0000 mg | ORAL_TABLET | Freq: Every day | ORAL | 4 refills | Status: DC

## 2017-02-22 MED ORDER — OMEPRAZOLE 20 MG PO CPDR: 20.0000 mg | DELAYED_RELEASE_CAPSULE | Freq: Every day | ORAL | 4 refills | Status: DC

## 2017-02-22 MED ORDER — ATORVASTATIN CALCIUM 20 MG PO TABS: 20.0000 mg | ORAL_TABLET | Freq: Every day | ORAL | 4 refills | Status: DC

## 2017-02-22 MED ORDER — FLUOXETINE HCL 20 MG PO TABS: 20.0000 mg | ORAL_TABLET | Freq: Every day | ORAL | 4 refills | Status: DC

## 2017-02-22 MED ORDER — TRAZODONE HCL 50 MG PO TABS: 50.0000 mg | ORAL_TABLET | Freq: Every evening | ORAL | 5 refills | Status: DC | PRN

## 2017-02-22 MED ORDER — PIOGLITAZONE HCL 30 MG PO TABS: 30.0000 mg | ORAL_TABLET | Freq: Every day | ORAL | 4 refills | Status: DC

## 2017-02-22 MED ORDER — LISINOPRIL 10 MG PO TABS: 10.0000 mg | ORAL_TABLET | Freq: Every day | ORAL | 4 refills | Status: DC

## 2017-02-22 MED ORDER — METFORMIN HCL 500 MG PO TABS: 1000.0000 mg | ORAL_TABLET | Freq: Two times a day (BID) | ORAL | 4 refills | Status: DC

## 2017-02-22 NOTE — Progress Notes (Signed)
BP 114/79   Pulse 73   Wt 219 lb (99.3 kg)   SpO2 99%   BMI 34.30 kg/m    Subjective:    Patient ID: Stacey Gross, female    DOB: 01/11/1961, 56 y.o.   MRN: [id number]  HPI: Stacey Gross is a 56 y.o. female Annual exam  Chief Complaint  Patient presents with  . Follow-up  . Hypertension  . Diabetes  . Hyperlipidemia   Patient follow-up doing really well blood pressure medications no issues or complaints. Same for cholesterol doing faithfully without problems or issues. Diabetes doing well noted low blood sugar spells taking medications without problems or side effects and doing well. Unfortunately patient continues to smoke!  patient of concern has ongoing low back pain has appointment with back doctor in February and is taking Goody's powders and Tylenol to help with pain..  Relevant past medical, surgical, family and social history reviewed and updated as indicated. Interim medical history since our last visit reviewed. Allergies and medications reviewed and updated.  Review of Systems  Constitutional: Negative.   HENT: Negative.   Eyes: Negative.   Respiratory: Negative.   Cardiovascular: Negative.   Gastrointestinal: Negative.   Endocrine: Negative.   Genitourinary: Negative.   Musculoskeletal: Negative.   Skin: Negative.   Allergic/Immunologic: Negative.   Neurological: Negative.   Hematological: Negative.   Psychiatric/Behavioral: Negative.     Per HPI unless specifically indicated above     Objective:    BP 114/79   Pulse 73   Wt 219 lb (99.3 kg)   SpO2 99%   BMI 34.30 kg/m   Wt Readings from Last 3 Encounters:  02/22/17 219 lb (99.3 kg)  01/25/17 220 lb 6.4 oz (100 kg)  12/28/16 217 lb (98.4 kg)    Physical Exam  Constitutional: She is oriented to person, place, and time. She appears well-developed and well-nourished.  HENT:  Head: Normocephalic and atraumatic.  Right Ear: External ear normal.  Left Ear: External ear normal.  Nose:  Nose normal.  Mouth/Throat: Oropharynx is clear and moist.  Eyes: Conjunctivae and EOM are normal. Pupils are equal, round, and reactive to light.  Neck: Normal range of motion. Neck supple. Carotid bruit is not present.  Cardiovascular: Normal rate, regular rhythm and normal heart sounds.  No murmur heard. Pulmonary/Chest: Effort normal and breath sounds normal. She exhibits no mass. Right breast exhibits no mass, no skin change and no tenderness. Left breast exhibits no mass, no skin change and no tenderness. Breasts are symmetrical.  Abdominal: Soft. Bowel sounds are normal. There is no hepatosplenomegaly.  Musculoskeletal: Normal range of motion.  Neurological: She is alert and oriented to person, place, and time.  Skin: No rash noted. No erythema.  Psychiatric: She has a normal mood and affect. Her behavior is normal. Judgment and thought content normal.    Results for orders placed or performed in visit on 11/14/16  Bayer DCA Hb A1c Waived  Result Value Ref Range   Bayer DCA Hb A1c Waived 5.6 <7.0 %      Assessment & Plan:   Problem List Items Addressed This Visit      Cardiovascular and Mediastinum   Hypertension - Primary    The current medical regimen is effective;  continue present plan and medications.       Relevant Orders   Bayer DCA Hb A1c Waived   LP+ALT+AST [place], [place]     Digestive   Acid reflux  Endocrine   Diabetes [facility])    The current medical regimen is effective;  continue present plan and medications.       Relevant Orders   Bayer DCA Hb A1c Waived   LP+ALT+AST [place], Waived     Other   Hyperlipidemia   Relevant Orders   Bayer DCA Hb A1c Waived   LP+ALT+AST [place], Waived   Postoperative back pain    Discuss renal function and Goody's powders will continue meloxicam and limit Goody's powders.      Depression   Advanced care planning/counseling discussion    A voluntary discussion about advance care planning including the  explanation and discussion of advance directives was extensively discussed  with the patient.  Explanation about the health care proxy and Living will was reviewed and packet with forms with explanation of how to fill them out was given.        Other Visit Diagnoses    Needs flu shot       Relevant Orders   Flu Vaccine QUAD 36+ mos IM (Completed)   Infection/inflammation due to internal orthopedic device/implant/graft, subsequent encounter       PE (physical exam), annual           Follow up plan: Return in about 6 months (around 08/22/2017) for Hemoglobin A1c, BMP,  Lipids, ALT, AST.

## 2017-02-22 NOTE — Assessment & Plan Note (Addendum)
Discuss renal function and Goody's powders will continue meloxicam and limit Goody's powders.

## 2017-02-22 NOTE — Assessment & Plan Note (Signed)
A voluntary discussion about advance care planning including the explanation and discussion of advance directives was extensively discussed  with the patient.  Explanation about the health care proxy and Living will was reviewed and packet with forms with explanation of how to fill them out was given.    

## 2017-02-22 NOTE — Assessment & Plan Note (Signed)
The current medical regimen is effective;  continue present plan and medications.  

## 2017-02-23 LAB — COMPREHENSIVE METABOLIC PANEL
ALBUMIN: 4.6 g/dL (ref 3.5–5.5)
ALK PHOS: 94 IU/L (ref 39–117)
ALT: 17 IU/L (ref 0–32)
AST: 17 IU/L (ref 0–40)
Albumin/Globulin Ratio: 2.2 (ref 1.2–2.2)
BILIRUBIN TOTAL: 0.3 mg/dL (ref 0.0–1.2)
BUN/Creatinine Ratio: 15 (ref 9–23)
BUN: 18 mg/dL (ref 6–24)
CHLORIDE: 101 mmol/L (ref 96–106)
CO2: 24 mmol/L (ref 20–29)
CREATININE: 1.19 mg/dL — AB (ref 0.57–1.00)
Calcium: 9.6 mg/dL (ref 8.7–10.2)
GFR calc Af Amer: 58 mL/min/{1.73_m2} — ABNORMAL LOW (ref >59)
GFR calc non Af Amer: 50 mL/min/{1.73_m2} — ABNORMAL LOW (ref >59)
GLUCOSE: 100 mg/dL — AB (ref 65–99)
Globulin, Total: 2.1 g/dL (ref 1.5–4.5)
Potassium: 5 mmol/L (ref 3.5–5.2)
Sodium: 141 mmol/L (ref 134–144)
Total Protein: 6.7 g/dL (ref 6.0–8.5)

## 2017-02-23 LAB — CBC WITH DIFFERENTIAL/PLATELET
BASOS ABS: 0.1 10*3/uL (ref 0.0–0.2)
Basos: 1 %
EOS (ABSOLUTE): 0.1 10*3/uL (ref 0.0–0.4)
EOS: 1 %
HEMATOCRIT: 39.2 % (ref 34.0–46.6)
HEMOGLOBIN: 12.8 g/dL (ref 11.1–15.9)
IMMATURE GRANULOCYTES: 0 %
Immature Grans (Abs): 0 10*3/uL (ref 0.0–0.1)
LYMPHS: 29 %
Lymphocytes Absolute: 1.9 10*3/uL (ref 0.7–3.1)
MCH: 30.2 pg (ref 26.6–33.0)
MCHC: 32.7 g/dL (ref 31.5–35.7)
MCV: 93 fL (ref 79–97)
MONOCYTES: 6 %
Monocytes Absolute: 0.4 10*3/uL (ref 0.1–0.9)
NEUTROS PCT: 63 %
Neutrophils Absolute: 4.2 10*3/uL (ref 1.4–7.0)
PLATELETS: 307 10*3/uL (ref 150–379)
RBC: 4.24 x10E6/uL (ref 3.77–5.28)
RDW: 13 % (ref 12.3–15.4)
WBC: 6.6 10*3/uL (ref 3.4–10.8)

## 2017-02-23 LAB — TSH: TSH: 0.983 u[IU]/mL (ref 0.450–4.500)

## 2017-02-28 ENCOUNTER — Telehealth: Payer: Self-pay | Admitting: Family Medicine

## 2017-02-28 NOTE — Telephone Encounter (Signed)
Phone call Discussed with patient slight decline in renal function not taking anti-inflammatory agents will observe recheck BMP next office visit.

## 2017-03-06 ENCOUNTER — Telehealth: Payer: Self-pay | Admitting: Family Medicine

## 2017-03-06 ENCOUNTER — Encounter: Payer: Self-pay | Admitting: *Deleted

## 2017-03-06 MED ORDER — AZITHROMYCIN 250 MG PO TABS: ORAL_TABLET | ORAL | 0 refills | Status: DC

## 2017-03-06 NOTE — Telephone Encounter (Signed)
rx sent

## 2017-03-06 NOTE — Telephone Encounter (Signed)
Pt is requesting an antibiotic for her cold. She has had the cold for about a week. She has chest congestion but not coughing up anything. She has taken Mucinex and it is not helping. She does have some shortness of breathe with exertion. Wakes up "with a tickle in her throat". She does not have a temp elevation.

## 2017-03-06 NOTE — Addendum Note (Signed)
Addended by: Golden Pop A on: 03/06/2017 05:21 PM   Modules accepted: Orders

## 2017-03-06 NOTE — Telephone Encounter (Signed)
This encounter was created in error - please disregard.

## 2017-03-15 ENCOUNTER — Telehealth: Payer: Self-pay | Admitting: Family Medicine

## 2017-03-15 MED ORDER — AMOXICILLIN-POT CLAVULANATE 875-125 MG PO TABS: 1.0000 | ORAL_TABLET | Freq: Two times a day (BID) | ORAL | 0 refills | Status: DC

## 2017-03-15 NOTE — Telephone Encounter (Signed)
Patient was contacted. She has completed Zpac. Still continues to have chest congestion, w/ mostly dry, some productivity. Please advise.

## 2017-03-15 NOTE — Telephone Encounter (Signed)
Please Advise

## 2017-03-15 NOTE — Telephone Encounter (Signed)
Phone call Discussed with patient Z-Pak no better cough and feeling bad will call in Augmentin.

## 2017-03-15 NOTE — Telephone Encounter (Signed)
Copied from [place]. Topic: Inquiry >> Mar 15, 2017 10:19 AM Cecelia Byars, NT wrote: Reason for CRM: Patient needs to discuss anti biotics requested to speak with PCP's nurse only  please advise [phone]

## 2017-03-15 NOTE — Telephone Encounter (Signed)
Call pt 

## 2017-04-18 ENCOUNTER — Ambulatory Visit (INDEPENDENT_AMBULATORY_CARE_PROVIDER_SITE_OTHER): Payer: Medicare Other | Admitting: Family Medicine

## 2017-04-18 ENCOUNTER — Telehealth: Payer: Self-pay | Admitting: Family Medicine

## 2017-04-18 ENCOUNTER — Encounter: Payer: Self-pay | Admitting: Family Medicine

## 2017-04-18 ENCOUNTER — Ambulatory Visit
Admission: RE | Admit: 2017-04-18 | Discharge: 2017-04-18 | Disposition: A | Discharge status: Home / Self Care | Payer: Medicare Other | Source: Ambulatory Visit | Attending: Family Medicine | Admitting: Family Medicine

## 2017-04-18 ENCOUNTER — Ambulatory Visit: Payer: Self-pay | Admitting: *Deleted

## 2017-04-18 VITALS — BP 127/75 | HR 87 | Temp 99.5°F | Wt 224.1 lb

## 2017-04-18 DIAGNOSIS — K5732 Diverticulitis of large intestine without perforation or abscess without bleeding: Secondary | ICD-10-CM | POA: Diagnosis not present

## 2017-04-18 DIAGNOSIS — I700 Atherosclerosis of aorta: Secondary | ICD-10-CM | POA: Insufficient documentation

## 2017-04-18 DIAGNOSIS — R109 Unspecified abdominal pain: Secondary | ICD-10-CM | POA: Diagnosis not present

## 2017-04-18 DIAGNOSIS — R1032 Left lower quadrant pain: Secondary | ICD-10-CM | POA: Diagnosis not present

## 2017-04-18 DIAGNOSIS — K5720 Diverticulitis of large intestine with perforation and abscess without bleeding: Secondary | ICD-10-CM | POA: Diagnosis not present

## 2017-04-18 HISTORY — DX: Malignant (primary) neoplasm, unspecified: C80.1

## 2017-04-18 HISTORY — DX: Unspecified asthma, uncomplicated: J45.909

## 2017-04-18 LAB — UA/M W/RFLX CULTURE, ROUTINE
Bilirubin, UA: NEGATIVE
GLUCOSE, UA: NEGATIVE
KETONES UA: NEGATIVE
NITRITE UA: NEGATIVE
Protein, UA: NEGATIVE
RBC, UA: NEGATIVE
SPEC GRAV UA: 1.015 (ref 1.005–1.030)
Urobilinogen, Ur: 0.2 mg/dL (ref 0.2–1.0)
pH, UA: 6 (ref 5.0–7.5)

## 2017-04-18 LAB — MICROSCOPIC EXAMINATION
BACTERIA UA: NONE SEEN
RBC MICROSCOPIC, UA: NONE SEEN /HPF (ref 0–?)

## 2017-04-18 LAB — POCT I-STAT CREATININE: Creatinine, Ser: 0.8 mg/dL (ref 0.44–1.00)

## 2017-04-18 MED ORDER — METRONIDAZOLE 500 MG PO TABS: 500.0000 mg | ORAL_TABLET | Freq: Two times a day (BID) | ORAL | 0 refills | Status: DC

## 2017-04-18 MED ORDER — CIPROFLOXACIN HCL 500 MG PO TABS: 500.0000 mg | ORAL_TABLET | Freq: Two times a day (BID) | ORAL | 0 refills | Status: DC

## 2017-04-18 MED ORDER — TRAMADOL HCL 50 MG PO TABS: 50.0000 mg | ORAL_TABLET | Freq: Three times a day (TID) | ORAL | 0 refills | Status: DC | PRN

## 2017-04-18 MED ORDER — IOPAMIDOL (ISOVUE-370) INJECTION 76%
80.0000 mL | Freq: Once | INTRAVENOUS | Status: AC | PRN
  Administered 2017-04-18: 80 mL via INTRAVENOUS

## 2017-04-18 NOTE — Telephone Encounter (Signed)
Call received from [facility] and message relayed to patient as below. Patient has acute diverticulitis. Will treat with antibiotics and give some medicine for pain. No perforation, no need to go to the ER. Rx sent to her pharmacy. I'd like to see her on Friday to make sure she's doing better. We can see her at 1:30 on Friday. Please call in tramadol. Thanks!

## 2017-04-18 NOTE — Telephone Encounter (Signed)
Medication called into [facility].

## 2017-04-18 NOTE — Telephone Encounter (Signed)
Pt c/o rectal pain with some bleeding. Had been constipated and took Ex lax, which did not help her. Denied having external hemorrhoids. Appointment made for today.  Reason for Disposition . SEVERE rectal pain (e.g., excruciating, unable to have a bowel movement)  Answer Assessment - Initial Assessment Questions 1. SYMPTOM:  "What's the main symptom you're concerned about?" (e.g., pain, itching, swelling, rash)     pain 2. ONSET: "When did the ________  start?"     A week ago 3. RECTAL PAIN: "Do you have any pain around your rectum?" "How bad is the pain?"  (Scale 1-10; or mild, moderate, severe)  - MILD (1-3): doesn't interfere with normal activities   - MODERATE (4-7): interferes with normal activities or awakens from sleep, limping   - SEVERE (8-10): excruciating pain, unable to have a bowel movement      #10 4. RECTAL ITCHING: "Do you have any itching in this area?" "How bad is the itching?"  (Scale 1-10; or mild, moderate, severe)  - MILD - doesn't interfere with normal activities   - MODERATE-SEVERE: interferes with normal activities or awakens from sleep     no 5. CONSTIPATION: "Do you have constipation?" If so, "How bad is it?"     Yes has been 2 days 6. CAUSE: "What do you think is causing the anus symptoms?"     Not sure 7. OTHER SYMPTOMS: "Do you have any other symptoms?"  (e.g., rectal bleeding, abdominal pain, vomiting, fever)     Rectal bleeding, abdominal pain on the left side 8. PREGNANCY: "Is there any chance you are pregnant?" "When was your last menstrual period?"     no  Protocols used: RECTAL [facility]

## 2017-04-18 NOTE — Progress Notes (Signed)
BP 127/75 (BP Location: Left Arm, Patient Position: Sitting, Cuff Size: Large)   Pulse 87   Temp 99.5 F (37.5 C)   Wt 224 lb 1 oz (101.6 kg)   SpO2 96%   BMI 35.09 kg/m    Subjective:    Patient ID: Stacey Gross, female    DOB: May 30, 1960, 57 y.o.   MRN: [id number]  HPI: Stacey Gross is a 57 y.o. female  Chief Complaint  Patient presents with  . Back Pain  . Abdominal Pain   ABDOMINAL PAIN- has been having pain in LLQ  Duration: over a week Onset: sudden Severity: severe Quality: sharp Location:  LLQ  Episode duration:  Radiation: no Frequency: constant Alleviating factors:  Aggravating factors: Status: worse Treatments attempted: PPI Fever: no Nausea: no Vomiting: no Weight loss: no Decreased appetite: no Diarrhea: yes Constipation: yes Blood in stool: yes Heartburn: no Jaundice: no Rash: no Dysuria/urinary frequency: no Hematuria: no History of sexually transmitted disease: no Recurrent NSAID use: no  Relevant past medical, surgical, family and social history reviewed and updated as indicated. Interim medical history since our last visit reviewed. Allergies and medications reviewed and updated.  Review of Systems  Constitutional: Negative.   Respiratory: Negative.   Cardiovascular: Negative.   Gastrointestinal: Positive for abdominal pain, anal bleeding, blood in stool, constipation and diarrhea. Negative for abdominal distention, nausea, rectal pain and vomiting.  Genitourinary: Negative.   Neurological: Negative.   Psychiatric/Behavioral: Negative.     Per HPI unless specifically indicated above     Objective:    BP 127/75 (BP Location: Left Arm, Patient Position: Sitting, Cuff Size: Large)   Pulse 87   Temp 99.5 F (37.5 C)   Wt 224 lb 1 oz (101.6 kg)   SpO2 96%   BMI 35.09 kg/m   Wt Readings from Last 3 Encounters:  04/18/17 224 lb 1 oz (101.6 kg)  02/22/17 219 lb (99.3 kg)  01/25/17 220 lb 6.4 oz (100 kg)    Physical Exam   Constitutional: She is oriented to person, place, and time. She appears well-developed and well-nourished. No distress.  HENT:  Head: Normocephalic and atraumatic.  Right Ear: Hearing normal.  Left Ear: Hearing normal.  Nose: Nose normal.  Eyes: Conjunctivae and lids are normal. Right eye exhibits no discharge. Left eye exhibits no discharge. No scleral icterus.  Cardiovascular: Normal rate, regular rhythm, normal heart sounds and intact distal pulses. Exam reveals no gallop and no friction rub.  No murmur heard. Pulmonary/Chest: Effort normal and breath sounds normal. No respiratory distress. She has no wheezes. She has no rales. She exhibits no tenderness.  Abdominal: Soft. Bowel sounds are normal. She exhibits no distension and no mass. There is tenderness (LLQ). There is no rebound and no guarding.  Musculoskeletal: Normal range of motion.  Neurological: She is alert and oriented to person, place, and time.  Skin: Skin is warm, dry and intact. No rash noted. She is not diaphoretic. No erythema. No pallor.  Psychiatric: She has a normal mood and affect. Her speech is normal and behavior is normal. Judgment and thought content normal. Cognition and memory are normal.  Nursing note and vitals reviewed.   Results for orders placed or performed in visit on 02/22/17  Microscopic Examination  Result Value Ref Range   WBC, UA 0-5 0 - 5 /hpf   RBC, UA 0-2 0 - 2 /hpf   Epithelial Cells (non renal) 0-10 0 - 10 /hpf   Renal Epithel,  UA 0-10 (A) None seen /hpf   Bacteria, UA Few None seen/Few  Bayer DCA Hb A1c Waived  Result Value Ref Range   Bayer DCA Hb A1c Waived 5.8 <7.0 %  LP+ALT+AST Piccolo, Waived  Result Value Ref Range   ALT (SGPT) Piccolo, Waived 19 10 - 47 U/L   AST (SGOT) Piccolo, Waived 32 11 - 38 U/L   Cholesterol Piccolo, Waived 194 <200 mg/dL   HDL Chol Piccolo, Waived 51 (L) >59 mg/dL   Triglycerides Piccolo,Waived 227 (H) <150 mg/dL   Chol/HDL Ratio Piccolo,Waive 3.8  mg/dL   LDL Chol Calc Piccolo Waived 97 <100 mg/dL   VLDL Chol Calc Piccolo,Waive 45 (H) <30 mg/dL  Comprehensive metabolic panel  Result Value Ref Range   Glucose 100 (H) 65 - 99 mg/dL   BUN 18 6 - 24 mg/dL   Creatinine, Ser 1.19 (H) 0.57 - 1.00 mg/dL   GFR calc non Af Amer 50 (L) >59 mL/min/1.73   GFR calc Af Amer 58 (L) >59 mL/min/1.73   BUN/Creatinine Ratio 15 9 - 23   Sodium 141 134 - 144 mmol/L   Potassium 5.0 3.5 - 5.2 mmol/L   Chloride 101 96 - 106 mmol/L   CO2 24 20 - 29 mmol/L   Calcium 9.6 8.7 - 10.2 mg/dL   Total Protein 6.7 6.0 - 8.5 g/dL   Albumin 4.6 3.5 - 5.5 g/dL   Globulin, Total 2.1 1.5 - 4.5 g/dL   Albumin/Globulin Ratio 2.2 1.2 - 2.2   Bilirubin Total 0.3 0.0 - 1.2 mg/dL   Alkaline Phosphatase 94 39 - 117 IU/L   AST 17 0 - 40 IU/L   ALT 17 0 - 32 IU/L  CBC with Differential/Platelet  Result Value Ref Range   WBC 6.6 3.4 - 10.8 x10E3/uL   RBC 4.24 3.77 - 5.28 x10E6/uL   Hemoglobin 12.8 11.1 - 15.9 g/dL   Hematocrit 39.2 34.0 - 46.6 %   MCV 93 79 - 97 fL   MCH 30.2 26.6 - 33.0 pg   MCHC 32.7 31.5 - 35.7 g/dL   RDW 13.0 12.3 - 15.4 %   Platelets 307 150 - 379 x10E3/uL   Neutrophils 63 Not Estab. %   Lymphs 29 Not Estab. %   Monocytes 6 Not Estab. %   Eos 1 Not Estab. %   Basos 1 Not Estab. %   Neutrophils Absolute 4.2 1.4 - 7.0 x10E3/uL   Lymphocytes Absolute 1.9 0.7 - 3.1 x10E3/uL   Monocytes Absolute 0.4 0.1 - 0.9 x10E3/uL   EOS (ABSOLUTE) 0.1 0.0 - 0.4 x10E3/uL   Basophils Absolute 0.1 0.0 - 0.2 x10E3/uL   Immature Granulocytes 0 Not Estab. %   Immature Grans (Abs) 0.0 0.0 - 0.1 x10E3/uL  TSH  Result Value Ref Range   TSH 0.983 0.450 - 4.500 uIU/mL  Urinalysis, Routine w reflex microscopic  Result Value Ref Range   Specific Gravity, UA 1.020 1.005 - 1.030   pH, UA 6.5 5.0 - 7.5   Color, UA Yellow Yellow   Appearance Ur Hazy (A) Clear   Leukocytes, UA 1+ (A) Negative   Protein, UA Negative Negative/Trace   Glucose, UA Negative Negative    Ketones, UA Negative Negative   RBC, UA Negative Negative   Bilirubin, UA Negative Negative   Urobilinogen, Ur 1.0 0.2 - 1.0 mg/dL   Nitrite, UA Negative Negative   Microscopic Examination See below:       Assessment & Plan:   Problem List Items  Addressed This Visit    None    Visit Diagnoses    Left lower quadrant pain    -  Primary   Significant concern for diverticulitis. Will obtain CT of adomen and pelvis. Await results. Checking CBC and CMP. Continue to monitor closely.   Relevant Orders   CT Abdomen Pelvis W Contrast   CBC with Differential/Platelet   Comprehensive metabolic panel   Abdominal pain, unspecified abdominal location       Relevant Orders   UA/M w/rflx Culture, Routine   CBC with Differential/Platelet   Comprehensive metabolic panel       Follow up plan: Return Pending results.

## 2017-04-21 ENCOUNTER — Encounter: Payer: Self-pay | Admitting: Family Medicine

## 2017-04-21 ENCOUNTER — Ambulatory Visit (INDEPENDENT_AMBULATORY_CARE_PROVIDER_SITE_OTHER): Payer: Medicare Other | Admitting: Family Medicine

## 2017-04-21 VITALS — BP 130/76 | HR 87 | Temp 98.6°F | Wt 224.1 lb

## 2017-04-21 DIAGNOSIS — K5900 Constipation, unspecified: Secondary | ICD-10-CM

## 2017-04-21 DIAGNOSIS — K5792 Diverticulitis of intestine, part unspecified, without perforation or abscess without bleeding: Secondary | ICD-10-CM

## 2017-04-21 NOTE — Patient Instructions (Addendum)
Diverticulitis °Diverticulitis is infection or inflammation of small pouches (diverticula) in the colon that form due to a condition called diverticulosis. Diverticula can trap stool (feces) and bacteria, causing infection and inflammation. °Diverticulitis may cause severe stomach pain and diarrhea. It may lead to tissue damage in the colon that causes bleeding. The diverticula may also burst (rupture) and cause infected stool to enter other areas of the abdomen. °Complications of diverticulitis can include: °· Bleeding. °· Severe infection. °· Severe pain. °· Rupture (perforation) of the colon. °· Blockage (obstruction) of the colon. ° °What are the causes? °This condition is caused by stool becoming trapped in the diverticula, which allows bacteria to grow in the diverticula. This leads to inflammation and infection. °What increases the risk? °You are more likely to develop this condition if: °· You have diverticulosis. The risk for diverticulosis increases if: °? You are overweight or obese. °? You use tobacco products. °? You do not get enough exercise. °· You eat a diet that does not include enough fiber. High-fiber foods include fruits, vegetables, beans, nuts, and whole grains. ° °What are the signs or symptoms? °Symptoms of this condition may include: °· Pain and tenderness in the abdomen. The pain is normally located on the left side of the abdomen, but it may occur in other areas. °· Fever and chills. °· Bloating. °· Cramping. °· Nausea. °· Vomiting. °· Changes in bowel routines. °· Blood in your stool. ° °How is this diagnosed? °This condition is diagnosed based on: °· Your medical history. °· A physical exam. °· Tests to make sure there is nothing else causing your condition. These tests may include: °? Blood tests. °? Urine tests. °? Imaging tests of the abdomen, including X-rays, ultrasounds, MRIs, or CT scans. ° °How is this treated? °Most cases of this condition are mild and can be treated at home.  Treatment may include: °· Taking over-the-counter pain medicines. °· Following a clear liquid diet. °· Taking antibiotic medicines by mouth. °· Rest. ° °More severe cases may need to be treated at a hospital. Treatment may include: °· Not eating or drinking. °· Taking prescription pain medicine. °· Receiving antibiotic medicines through an IV tube. °· Receiving fluids and nutrition through an IV tube. °· Surgery. ° °When your condition is under control, your health care provider may recommend that you have a colonoscopy. This is an exam to look at the entire large intestine. During the exam, a lubricated, bendable tube is inserted into the anus and then passed into the rectum, colon, and other parts of the large intestine. A colonoscopy can show how severe your diverticula are and whether something else may be causing your symptoms. °Follow these instructions at home: °Medicines °· Take over-the-counter and prescription medicines only as told by your health care provider. These include fiber supplements, probiotics, and stool softeners. °· If you were prescribed an antibiotic medicine, take it as told by your health care provider. Do not stop taking the antibiotic even if you start to feel better. °· Do not drive or use heavy machinery while taking prescription pain medicine. °General instructions °· Follow a full liquid diet or another diet as directed by your health care provider. After your symptoms improve, your health care provider may tell you to change your diet. He or she may recommend that you eat a diet that contains at least 25 g (25 grams) of fiber daily. Fiber makes it easier to pass stool. Healthy sources of fiber include: °? Berries. One cup   contains 4-8 grams of fiber. °? Beans or lentils. One half cup contains 5-8 grams of fiber. °? Green vegetables. One cup contains 4 grams of fiber. °· Exercise for at least 30 minutes, 3 times each week. You should exercise hard enough to raise your heart rate and  break a sweat. °· Keep all follow-up visits as told by your health care provider. This is important. You may need a colonoscopy. °Contact a health care provider if: °· Your pain does not improve. °· You have a hard time drinking or eating food. °· Your bowel movements do not return to normal. °Get help right away if: °· Your pain gets worse. °· Your symptoms do not get better with treatment. °· Your symptoms suddenly get worse. °· You have a fever. °· You vomit more than one time. °· You have stools that are bloody, black, or tarry. °Summary °· Diverticulitis is infection or inflammation of small pouches (diverticula) in the colon that form due to a condition called diverticulosis. Diverticula can trap stool (feces) and bacteria, causing infection and inflammation. °· You are at higher risk for this condition if you have diverticulosis and you eat a diet that does not include enough fiber. °· Most cases of this condition are mild and can be treated at home. More severe cases may need to be treated at a hospital. °· When your condition is under control, your health care provider may recommend that you have an exam called a colonoscopy. This exam can show how severe your diverticula are and whether something else may be causing your symptoms. °This information is not intended to replace advice given to you by your health care provider. Make sure you discuss any questions you have with your health care provider. °Document Released: 12/29/2004 Document Revised: 04/23/2016 Document Reviewed: 04/23/2016 °Elsevier Interactive Patient Education © 2018 [employer] ° °

## 2017-04-21 NOTE — Progress Notes (Signed)
BP 130/76 (BP Location: Left Arm, Patient Position: Sitting, Cuff Size: Normal)   Pulse 87   Temp 98.6 F (37 C)   Wt 224 lb 1 oz (101.6 kg)   SpO2 97%   BMI 35.09 kg/m    Subjective:    Patient ID: Stacey Gross, female    DOB: [birth year], 57 y.o.   MRN: [id number]  HPI: Stacey Gross is a 57 y.o. female  Chief Complaint  Patient presents with  . Diverticulitis   Still not feeling well. She notes that she is still feeling worse after she eats. She is still having pain in her LLQ. No fevers. Doing a bit better, but very constipated. She feels like every time she eats something she has to go to the bathroom and then she is only able to get out a very small pellet. Belly feels like it's tight and aching. She has had no fevers. No chills. No diarrhea. No blood. No other concerns or complaints at this time.   Relevant past medical, surgical, family and social history reviewed and updated as indicated. Interim medical history since our last visit reviewed. Allergies and medications reviewed and updated.  Review of Systems  Constitutional: Negative.   Respiratory: Negative.   Cardiovascular: Negative.   Gastrointestinal: Positive for abdominal distention and abdominal pain. Negative for anal bleeding, blood in stool, constipation, diarrhea, nausea, rectal pain and vomiting.  Psychiatric/Behavioral: Negative.     Per HPI unless specifically indicated above     Objective:    BP 130/76 (BP Location: Left Arm, Patient Position: Sitting, Cuff Size: Normal)   Pulse 87   Temp 98.6 F (37 C)   Wt 224 lb 1 oz (101.6 kg)   SpO2 97%   BMI 35.09 kg/m   Wt Readings from Last 3 Encounters:  04/21/17 224 lb 1 oz (101.6 kg)  04/18/17 224 lb 1 oz (101.6 kg)  02/22/17 219 lb (99.3 kg)    Physical Exam  Constitutional: She is oriented to person, place, and time. She appears well-developed and well-nourished. No distress.  HENT:  Head: Normocephalic and atraumatic.  Right Ear:  Hearing normal.  Left Ear: Hearing normal.  Nose: Nose normal.  Eyes: Conjunctivae and lids are normal. Right eye exhibits no discharge. Left eye exhibits no discharge. No scleral icterus.  Cardiovascular: Normal rate, regular rhythm, normal heart sounds and intact distal pulses. Exam reveals no gallop and no friction rub.  No murmur heard. Pulmonary/Chest: Effort normal and breath sounds normal. No respiratory distress. She has no wheezes. She has no rales. She exhibits no tenderness.  Abdominal: Soft. Bowel sounds are normal. She exhibits no distension and no mass. There is tenderness. There is no rebound and no guarding.  Musculoskeletal: Normal range of motion.  Neurological: She is alert and oriented to person, place, and time.  Skin: Skin is warm, dry and intact. No rash noted. She is not diaphoretic. No erythema. No pallor.  Psychiatric: She has a normal mood and affect. Her speech is normal and behavior is normal. Judgment and thought content normal. Cognition and memory are normal.  Nursing note and vitals reviewed.   Results for orders placed or performed during the hospital encounter of 04/18/17  I-STAT creatinine  Result Value Ref Range   Creatinine, Ser 0.80 0.44 - 1.00 mg/dL      Assessment & Plan:   Problem List Items Addressed This Visit    None    Visit Diagnoses    Diverticulitis    -  Primary   Seems to be resolving. Pain significantly better on exam, but now with constipation pain. Will recheck on Monday.   Constipation, unspecified constipation type       Will do oral laxatives- avoid any enemas due to diverticulitis. Recheck on Monday.       Follow up plan: Return Monday, for follow up.

## 2017-05-30 ENCOUNTER — Encounter: Payer: Self-pay | Admitting: Family Medicine

## 2017-06-07 ENCOUNTER — Emergency Department
Admission: EM | Admit: 2017-06-07 | Discharge: 2017-06-07 | Disposition: A | Discharge status: Home / Self Care | Payer: Medicare Other | Attending: Emergency Medicine | Admitting: Emergency Medicine

## 2017-06-07 ENCOUNTER — Ambulatory Visit: Payer: Self-pay | Admitting: *Deleted

## 2017-06-07 ENCOUNTER — Encounter: Payer: Self-pay | Admitting: Emergency Medicine

## 2017-06-07 ENCOUNTER — Telehealth: Payer: Self-pay | Admitting: Family Medicine

## 2017-06-07 DIAGNOSIS — R109 Unspecified abdominal pain: Secondary | ICD-10-CM | POA: Insufficient documentation

## 2017-06-07 DIAGNOSIS — Z5321 Procedure and treatment not carried out due to patient leaving prior to being seen by health care provider: Secondary | ICD-10-CM | POA: Insufficient documentation

## 2017-06-07 LAB — URINALYSIS, COMPLETE (UACMP) WITH MICROSCOPIC
BACTERIA UA: NONE SEEN
Bilirubin Urine: NEGATIVE
GLUCOSE, UA: NEGATIVE mg/dL
Hgb urine dipstick: NEGATIVE
KETONES UR: NEGATIVE mg/dL
NITRITE: NEGATIVE
PH: 7 (ref 5.0–8.0)
Protein, ur: NEGATIVE mg/dL
Specific Gravity, Urine: 1.014 (ref 1.005–1.030)

## 2017-06-07 LAB — COMPREHENSIVE METABOLIC PANEL
ALT: 18 U/L (ref 14–54)
AST: 21 U/L (ref 15–41)
Albumin: 4.3 g/dL (ref 3.5–5.0)
Alkaline Phosphatase: 83 U/L (ref 38–126)
Anion gap: 10 (ref 5–15)
BILIRUBIN TOTAL: 0.6 mg/dL (ref 0.3–1.2)
BUN: 16 mg/dL (ref 6–20)
CO2: 26 mmol/L (ref 22–32)
Calcium: 9.2 mg/dL (ref 8.9–10.3)
Chloride: 101 mmol/L (ref 101–111)
Creatinine, Ser: 0.99 mg/dL (ref 0.44–1.00)
Glucose, Bld: 103 mg/dL — ABNORMAL HIGH (ref 65–99)
POTASSIUM: 4.6 mmol/L (ref 3.5–5.1)
Sodium: 137 mmol/L (ref 135–145)
TOTAL PROTEIN: 7.9 g/dL (ref 6.5–8.1)

## 2017-06-07 LAB — LIPASE, BLOOD: LIPASE: 28 U/L (ref 11–51)

## 2017-06-07 LAB — CBC
HEMATOCRIT: 40.7 % (ref 35.0–47.0)
Hemoglobin: 13.5 g/dL (ref 12.0–16.0)
MCH: 30.3 pg (ref 26.0–34.0)
MCHC: 33.2 g/dL (ref 32.0–36.0)
MCV: 91.3 fL (ref 80.0–100.0)
PLATELETS: 285 10*3/uL (ref 150–440)
RBC: 4.46 MIL/uL (ref 3.80–5.20)
RDW: 13.3 % (ref 11.5–14.5)
WBC: 9.8 10*3/uL (ref 3.6–11.0)

## 2017-06-07 NOTE — Telephone Encounter (Signed)
Patient is experiencing severe abdominal pain and also due to bowel movements from drinking a liquid to help her bowels she has been seeing blood.  Please advise.  Thank you

## 2017-06-07 NOTE — Telephone Encounter (Signed)
Pt called with complaints of colon pain; she has a history of diverticulitis; the pt states she took a bottle of clear laxative and the capsule that she had before but the pain has not gotten better; she also says that she "has been to the bathroom so much she is bleeding out her butt"; she also says that her stool has turned bloody; nurse triage initiated and recommendations made to include going to ED; also spoke with Santiago Glad at [facility] and was told that the message would be given to the provider; they will contact the pt before she leaves for the ED; the pt verbalizes understanding;  the pt can be reached at [phone].  Reason for Disposition . [1] SEVERE pain (e.g., excruciating) AND [2] present > 1 hour  Answer Assessment - Initial Assessment Questions 1. LOCATION: "Where does it hurt?"      Bottom of stomach 2. RADIATION: "Does the pain shoot anywhere else?" (e.g., chest, back)     Radiates up all the way up; feels like gas 3. ONSET: "When did the pain begin?" (e.g., minutes, hours or days ago)      06/06/17 4. SUDDEN: "Gradual or sudden onset?"     sudden 5. PATTERN "Does the pain come and go, or is it constant?"    - If constant: "Is it getting better, staying the same, or worsening?"      (Note: Constant means the pain never goes away completely; most serious pain is constant and it progresses)     - If intermittent: "How long does it last?" "Do you have pain now?"     (Note: Intermittent means the pain goes away completely between bouts)     intermittent 6. SEVERITY: "How bad is the pain?"  (e.g., Scale 1-10; mild, moderate, or severe)   - MILD (1-3): doesn't interfere with normal activities, abdomen soft and not tender to touch    - MODERATE (4-7): interferes with normal activities or awakens from sleep, tender to touch    - SEVERE (8-10): excruciating pain, doubled over, unable to do any normal activities      Rated 8 out of 10 7. RECURRENT SYMPTOM: "Have you ever had this type  of abdominal pain before?" If so, ask: "When was the last time?" and "What happened that time?"      Yes; history of diverticulitis 8. CAUSE: "What do you think is causing the abdominal pain?"    diverticulitis 9. RELIEVING/AGGRAVATING FACTORS: "What makes it better or worse?" (e.g., movement, antacids, bowel movement)     Sitting on toliet and passing gas helps 10. OTHER SYMPTOMS: "Has there been any vomiting, diarrhea, constipation, or urine problems?"       Constipation; took laxative and now having watery, bloody stool 11. PREGNANCY: "Is there any chance you are pregnant?" "When was your last menstrual period?"       No hysterectomy  Protocols used: ABDOMINAL PAIN - [facility]

## 2017-06-07 NOTE — ED Notes (Signed)
FN: pt to stat desk reporting to this RN that she is no longer staying to be seen and is leaving.

## 2017-06-07 NOTE — Telephone Encounter (Signed)
She has been advised to go to the ER- did she not go?

## 2017-06-07 NOTE — Telephone Encounter (Signed)
Patient notified to go to the ER patient agreed.

## 2017-06-07 NOTE — Telephone Encounter (Signed)
Agree with sending her to ER.

## 2017-06-07 NOTE — Telephone Encounter (Signed)
Pt  Was  Triaged   Earlier today  And  Was  Advised  To  Go  To  Er   -  Treatment   Was  Initiated  And  Patient  Left     Without completing  Treatment   Pt  Called  Back   Reports  Symptoms  Are  Still   Present  She reports  Pain is  Severe  Pt  Advised   To  Return to  Er   And  Complete  Treatment  . Pt was  Advised  To  Remain Npo .   Pt  States   She  Will  Comply  And  Go  To  Er . She  Says  She  Will go to [facility]    Reason for Disposition . [1] SEVERE pain (e.g., excruciating) AND [2] present > 1 hour  Answer Assessment - Initial Assessment Questions 1. LOCATION: "Where does it hurt?"      Lower  abdoman  l   Side    2. RADIATION: "Does the pain shoot anywhere else?" (e.g., chest, back)       Goes   Across   Stomach   3. ONSET: "When did the pain begin?" (e.g., minutes, hours or days ago)        2  Days   Ago   4. SUDDEN: "Gradual or sudden onset?"       Sudden   5. PATTERN "Does the pain come and go, or is it constant?"    - If constant: "Is it getting better, staying the same, or worsening?"      (Note: Constant means the pain never goes away completely; most serious pain is constant and it progresses)     - If intermittent: "How long does it last?" "Do you have pain now?"     (Note: Intermittent means the pain goes away completely between bouts)       Constant   6. SEVERITY: "How bad is the pain?"  (e.g., Scale 1-10; mild, moderate, or severe)   - MILD (1-3): doesn't interfere with normal activities, abdomen soft and not tender to touch    - MODERATE (4-7): interferes with normal activities or awakens from sleep, tender to touch    - SEVERE (8-10): excruciating pain, doubled over, unable to do any normal activities        Severe    7. RECURRENT SYMPTOM: "Have you ever had this type of abdominal pain before?" If so, ask: "When was the last time?" and "What happened that time?"      Yes     approx  1. 5  Months    Took  Anti  Biotics   8. CAUSE: "What do you think is causing the  abdominal pain?"      Does  Not  Know    9. RELIEVING/AGGRAVATING FACTORS: "What makes it better or worse?" (e.g., movement, antacids, bowel movement)       Movement   Makes  It  Worse    10. OTHER SYMPTOMS: "Has there been any vomiting, diarrhea, constipation, or urine problems?"       No    Pt   Pt took  Laxative  Yesterday   With  Watery  Rectal bleeding   11. PREGNANCY: "Is there any chance you are pregnant?" "When was your last menstrual period?"         N/a  Protocols used: ABDOMINAL PAIN - [facility]

## 2017-06-07 NOTE — Telephone Encounter (Signed)
I did this as high priority due to severe abdominal pain and blood.

## 2017-06-07 NOTE — ED Triage Notes (Signed)
Pt to ED with c/o of abdominal pain that started Monday. Pt has hx of diverticulitis.

## 2017-07-11 ENCOUNTER — Ambulatory Visit (INDEPENDENT_AMBULATORY_CARE_PROVIDER_SITE_OTHER): Payer: Medicare Other | Admitting: Family Medicine

## 2017-07-11 ENCOUNTER — Encounter: Payer: Self-pay | Admitting: Family Medicine

## 2017-07-11 DIAGNOSIS — I75022 Atheroembolism of left lower extremity: Secondary | ICD-10-CM | POA: Diagnosis not present

## 2017-07-11 DIAGNOSIS — K5732 Diverticulitis of large intestine without perforation or abscess without bleeding: Secondary | ICD-10-CM

## 2017-07-11 MED ORDER — METRONIDAZOLE 500 MG PO TABS: 500.0000 mg | ORAL_TABLET | Freq: Two times a day (BID) | ORAL | 0 refills | Status: DC

## 2017-07-11 MED ORDER — CIPROFLOXACIN HCL 500 MG PO TABS: 500.0000 mg | ORAL_TABLET | Freq: Two times a day (BID) | ORAL | 0 refills | Status: DC

## 2017-07-11 NOTE — Assessment & Plan Note (Signed)
Discussed diverticulitis care and treatment will use antibiotics for 2 weeks.  Patient will follow-up here in a month and at that time will consider GI referral. Discussed constipation care and treatment use of Dulcolax along with Senokot.

## 2017-07-11 NOTE — Progress Notes (Signed)
BP 116/82   Pulse 89   Ht 5\' 6"  (1.676 m)   SpO2 97%   BMI 35.51 kg/m    Subjective:    Patient ID: Stacey Gross, female    DOB: Feb 06, 1961, 57 y.o.   MRN: [id number]  HPI: Stacey Gross is a 57 y.o. female  Chief Complaint  Patient presents with  . Foot Swelling    and blistering. Bilateral  . Constipation    Pt would like colonoscopy.    Follow-up diverticulitis with patient got better with taking antibiotics for a week and then got worse ended up in the emergency room but after almost 6 hours of waiting left untreated. Patient now having marked problems with constipation has used over-the-counter what sounds like Senokot and may be Citroma.  Is barely having any motion at all. Also has some purplish changes of both feet and legs with varicosities and some mild edema.  Relevant past medical, surgical, family and social history reviewed and updated as indicated. Interim medical history since our last visit reviewed. Allergies and medications reviewed and updated.  Review of Systems  Constitutional: Negative.   Respiratory: Negative.   Cardiovascular: Negative.     Per HPI unless specifically indicated above     Objective:    BP 116/82   Pulse 89   Ht 5\' 6"  (1.676 m)   SpO2 97%   BMI 35.51 kg/m   Wt Readings from Last 3 Encounters:  06/07/17 220 lb (99.8 kg)  04/21/17 224 lb 1 oz (101.6 kg)  04/18/17 224 lb 1 oz (101.6 kg)    Physical Exam  Constitutional: She is oriented to person, place, and time. She appears well-developed and well-nourished. No distress.  HENT:  Head: Normocephalic and atraumatic.  Right Ear: Hearing normal.  Left Ear: Hearing normal.  Nose: Nose normal.  Eyes: Conjunctivae and lids are normal. Right eye exhibits no discharge. Left eye exhibits no discharge. No scleral icterus.  Pulmonary/Chest: Effort normal. No respiratory distress.  Musculoskeletal: Normal range of motion.  Neurological: She is alert and oriented to person,  place, and time.  Skin: Skin is intact. No rash noted.  Psychiatric: She has a normal mood and affect. Her speech is normal and behavior is normal. Judgment and thought content normal. Cognition and memory are normal.    Results for orders placed or performed during the hospital encounter of 06/07/17  Lipase, blood  Result Value Ref Range   Lipase 28 11 - 51 U/L  Comprehensive metabolic panel  Result Value Ref Range   Sodium 137 135 - 145 mmol/L   Potassium 4.6 3.5 - 5.1 mmol/L   Chloride 101 101 - 111 mmol/L   CO2 26 22 - 32 mmol/L   Glucose, Bld 103 (H) 65 - 99 mg/dL   BUN 16 6 - 20 mg/dL   Creatinine, Ser 0.99 0.44 - 1.00 mg/dL   Calcium 9.2 8.9 - 10.3 mg/dL   Total Protein 7.9 6.5 - 8.1 g/dL   Albumin 4.3 3.5 - 5.0 g/dL   AST 21 15 - 41 U/L   ALT 18 14 - 54 U/L   Alkaline Phosphatase 83 38 - 126 U/L   Total Bilirubin 0.6 0.3 - 1.2 mg/dL   GFR calc non Af Amer >60 >60 mL/min   GFR calc Af Amer >60 >60 mL/min   Anion gap 10 5 - 15  CBC  Result Value Ref Range   WBC 9.8 3.6 - 11.0 K/uL   RBC  4.46 3.80 - 5.20 MIL/uL   Hemoglobin 13.5 12.0 - 16.0 g/dL   HCT 40.7 35.0 - 47.0 %   MCV 91.3 80.0 - 100.0 fL   MCH 30.3 26.0 - 34.0 pg   MCHC 33.2 32.0 - 36.0 g/dL   RDW 13.3 11.5 - 14.5 %   Platelets 285 150 - 440 K/uL  Urinalysis, Complete w Microscopic  Result Value Ref Range   Color, Urine YELLOW (A) YELLOW   APPearance HAZY (A) CLEAR   Specific Gravity, Urine 1.014 1.005 - 1.030   pH 7.0 5.0 - 8.0   Glucose, UA NEGATIVE NEGATIVE mg/dL   Hgb urine dipstick NEGATIVE NEGATIVE   Bilirubin Urine NEGATIVE NEGATIVE   Ketones, ur NEGATIVE NEGATIVE mg/dL   Protein, ur NEGATIVE NEGATIVE mg/dL   Nitrite NEGATIVE NEGATIVE   Leukocytes, UA TRACE (A) NEGATIVE   RBC / HPF 0-5 0 - 5 RBC/hpf   WBC, UA 0-5 0 - 5 WBC/hpf   Bacteria, UA NONE SEEN NONE SEEN   Squamous Epithelial / LPF 0-5 (A) NONE SEEN      Assessment & Plan:   Problem List Items Addressed This Visit       Cardiovascular and Mediastinum   Blue toe syndrome (HCC)    Discussed elevation discussed compression        Digestive   Diverticulitis large intestine    Discussed diverticulitis care and treatment will use antibiotics for 2 weeks.  Patient will follow-up here in a month and at that time will consider GI referral. Discussed constipation care and treatment use of Dulcolax along with Senokot.      Relevant Medications   metroNIDAZOLE (FLAGYL) 500 MG tablet   ciprofloxacin (CIPRO) 500 MG tablet       Follow up plan: Return in about 1 month (around 08/08/2017).

## 2017-07-11 NOTE — Assessment & Plan Note (Signed)
Discussed elevation discussed compression

## 2017-08-08 ENCOUNTER — Encounter: Payer: Self-pay | Admitting: Family Medicine

## 2017-08-08 ENCOUNTER — Ambulatory Visit (INDEPENDENT_AMBULATORY_CARE_PROVIDER_SITE_OTHER): Payer: Medicare Other | Admitting: Family Medicine

## 2017-08-08 DIAGNOSIS — K5904 Chronic idiopathic constipation: Secondary | ICD-10-CM | POA: Diagnosis not present

## 2017-08-08 DIAGNOSIS — K5900 Constipation, unspecified: Secondary | ICD-10-CM | POA: Insufficient documentation

## 2017-08-08 DIAGNOSIS — J411 Mucopurulent chronic bronchitis: Secondary | ICD-10-CM

## 2017-08-08 DIAGNOSIS — K5732 Diverticulitis of large intestine without perforation or abscess without bleeding: Secondary | ICD-10-CM | POA: Diagnosis not present

## 2017-08-08 NOTE — Progress Notes (Signed)
BP 116/82   Pulse 66   Ht 5\' 6"  (1.676 m)   Wt 226 lb (102.5 kg)   SpO2 98%   BMI 36.48 kg/m    Subjective:    Patient ID: Stacey Gross, female    DOB: [birth year], 57 y.o.   MRN: [id number]  HPI: Stacey Gross is a 57 y.o. female  Chief Complaint  Patient presents with  . Follow-up    Doing much better. Still having difficulty w/ bm  Patient follow-up constipation diverticulitis.  Took antibiotics which is really helped with discomfort and pain but still having constipation problems.  Taking Dulcolax and Senokot.  Having issues with 2 loose stool up to 15 times a day to constipation. Again no further diverticulitis symptoms or changes.  No blood in stool or urine.  Relevant past medical, surgical, family and social history reviewed and updated as indicated. Interim medical history since our last visit reviewed. Allergies and medications reviewed and updated.  Review of Systems  Constitutional: Negative.   Respiratory: Negative.   Cardiovascular: Negative.     Per HPI unless specifically indicated above     Objective:    BP 116/82   Pulse 66   Ht 5\' 6"  (1.676 m)   Wt 226 lb (102.5 kg)   SpO2 98%   BMI 36.48 kg/m   Wt Readings from Last 3 Encounters:  08/08/17 226 lb (102.5 kg)  06/07/17 220 lb (99.8 kg)  04/21/17 224 lb 1 oz (101.6 kg)    Physical Exam  Constitutional: She is oriented to person, place, and time. She appears well-developed and well-nourished.  HENT:  Head: Normocephalic and atraumatic.  Eyes: Conjunctivae and EOM are normal.  Neck: Normal range of motion.  Cardiovascular: Normal rate, regular rhythm and normal heart sounds.  Pulmonary/Chest: Effort normal and breath sounds normal.  Musculoskeletal: Normal range of motion.  Neurological: She is alert and oriented to person, place, and time.  Skin: No erythema.  Psychiatric: She has a normal mood and affect. Her behavior is normal. Judgment and thought content normal.    Results for  orders placed or performed during the hospital encounter of 06/07/17  Lipase, blood  Result Value Ref Range   Lipase 28 11 - 51 U/L  Comprehensive metabolic panel  Result Value Ref Range   Sodium 137 135 - 145 mmol/L   Potassium 4.6 3.5 - 5.1 mmol/L   Chloride 101 101 - 111 mmol/L   CO2 26 22 - 32 mmol/L   Glucose, Bld 103 (H) 65 - 99 mg/dL   BUN 16 6 - 20 mg/dL   Creatinine, Ser 0.99 0.44 - 1.00 mg/dL   Calcium 9.2 8.9 - 10.3 mg/dL   Total Protein 7.9 6.5 - 8.1 g/dL   Albumin 4.3 3.5 - 5.0 g/dL   AST 21 15 - 41 U/L   ALT 18 14 - 54 U/L   Alkaline Phosphatase 83 38 - 126 U/L   Total Bilirubin 0.6 0.3 - 1.2 mg/dL   GFR calc non Af Amer >60 >60 mL/min   GFR calc Af Amer >60 >60 mL/min   Anion gap 10 5 - 15  CBC  Result Value Ref Range   WBC 9.8 3.6 - 11.0 K/uL   RBC 4.46 3.80 - 5.20 MIL/uL   Hemoglobin 13.5 12.0 - 16.0 g/dL   HCT 40.7 35.0 - 47.0 %   MCV 91.3 80.0 - 100.0 fL   MCH 30.3 26.0 - 34.0 pg   MCHC  33.2 32.0 - 36.0 g/dL   RDW 13.3 11.5 - 14.5 %   Platelets 285 150 - 440 K/uL  Urinalysis, Complete w Microscopic  Result Value Ref Range   Color, Urine YELLOW (A) YELLOW   APPearance HAZY (A) CLEAR   Specific Gravity, Urine 1.014 1.005 - 1.030   pH 7.0 5.0 - 8.0   Glucose, UA NEGATIVE NEGATIVE mg/dL   Hgb urine dipstick NEGATIVE NEGATIVE   Bilirubin Urine NEGATIVE NEGATIVE   Ketones, ur NEGATIVE NEGATIVE mg/dL   Protein, ur NEGATIVE NEGATIVE mg/dL   Nitrite NEGATIVE NEGATIVE   Leukocytes, UA TRACE (A) NEGATIVE   RBC / HPF 0-5 0 - 5 RBC/hpf   WBC, UA 0-5 0 - 5 WBC/hpf   Bacteria, UA NONE SEEN NONE SEEN   Squamous Epithelial / LPF 0-5 (A) NONE SEEN      Assessment & Plan:   Problem List Items Addressed This Visit      Respiratory   COPD (chronic obstructive pulmonary disease) ([place])    Still smoking discussed stopping        Digestive   Diverticulitis large intestine    No further pain is completed antibiotics and doing well.      Relevant Orders    Ambulatory referral to Gastroenterology     Other   Constipation    Discussed constipation care and treatment also GI referral      Relevant Orders   Ambulatory referral to Gastroenterology       Follow up plan: Return in about 1 month (around 09/05/2017) for BMP,  Lipids, ALT, AST, Hemoglobin A1c.

## 2017-08-08 NOTE — Assessment & Plan Note (Signed)
Still smoking discussed stopping 

## 2017-08-08 NOTE — Assessment & Plan Note (Signed)
Discussed constipation care and treatment also GI referral

## 2017-08-08 NOTE — Assessment & Plan Note (Signed)
No further pain is completed antibiotics and doing well.

## 2017-08-10 ENCOUNTER — Encounter: Payer: Self-pay | Admitting: Gastroenterology

## 2017-08-10 ENCOUNTER — Other Ambulatory Visit: Payer: Self-pay

## 2017-08-10 ENCOUNTER — Ambulatory Visit: Payer: Medicare Other | Admitting: Gastroenterology

## 2017-08-10 VITALS — BP 133/79 | HR 72 | Temp 97.8°F | Ht 66.0 in | Wt 226.8 lb

## 2017-08-10 DIAGNOSIS — K5909 Other constipation: Secondary | ICD-10-CM

## 2017-08-10 DIAGNOSIS — K5732 Diverticulitis of large intestine without perforation or abscess without bleeding: Secondary | ICD-10-CM | POA: Diagnosis not present

## 2017-08-10 NOTE — Patient Instructions (Signed)
F/u 3 months Blood work ordered-creatine  High-Fiber Diet Fiber, also called dietary fiber, is a type of carbohydrate found in fruits, vegetables, whole grains, and beans. A high-fiber diet can have many health benefits. Your health care provider may recommend a high-fiber diet to help:  Prevent constipation. Fiber can make your bowel movements more regular.  Lower your cholesterol.  Relieve hemorrhoids, uncomplicated diverticulosis, or irritable bowel syndrome.  Prevent overeating as part of a weight-loss plan.  Prevent heart disease, type 2 diabetes, and certain cancers.  What is my plan? The recommended daily intake of fiber includes:  38 grams for men under age 89.  93 grams for men over age 38.  53 grams for women under age 18.  6 grams for women over age 93.  You can get the recommended daily intake of dietary fiber by eating a variety of fruits, vegetables, grains, and beans. Your health care provider may also recommend a fiber supplement if it is not possible to get enough fiber through your diet. What do I need to know about a high-fiber diet?  Fiber supplements have not been widely studied for their effectiveness, so it is better to get fiber through food sources.  Always check the fiber content on thenutrition facts label of any prepackaged food. Look for foods that contain at least 5 grams of fiber per serving.  Ask your dietitian if you have questions about specific foods that are related to your condition, especially if those foods are not listed in the following section.  Increase your daily fiber consumption gradually. Increasing your intake of dietary fiber too quickly may cause bloating, cramping, or gas.  Drink plenty of water. Water helps you to digest fiber. What foods can I eat? Grains Whole-grain breads. Multigrain cereal. Oats and oatmeal. Brown rice. Barley. Bulgur wheat. [place]. Bran muffins. Popcorn. Rye wafer crackers. Vegetables Sweet  potatoes. Spinach. Kale. Artichokes. Cabbage. Broccoli. Green peas. Carrots. Squash. Fruits Berries. Pears. Apples. Oranges. Avocados. Prunes and raisins. Dried figs. Meats and Other Protein Sources [place], kidney, pinto, and soy beans. Split peas. Lentils. Nuts and seeds. Dairy Fiber-fortified yogurt. Beverages Fiber-fortified soy milk. Fiber-fortified orange juice. Other Fiber bars. The items listed above may not be a complete list of recommended foods or beverages. Contact your dietitian for more options. What foods are not recommended? Grains White bread. Pasta made with refined flour. White rice. Vegetables Fried potatoes. Canned vegetables. Well-cooked vegetables. Fruits Fruit juice. Cooked, strained fruit. Meats and Other Protein Sources Fatty cuts of meat. Fried Sales executive or fried fish. Dairy Milk. Yogurt. Cream cheese. Sour cream. Beverages Soft drinks. Other Cakes and pastries. Butter and oils. The items listed above may not be a complete list of foods and beverages to avoid. Contact your dietitian for more information. What are some tips for including high-fiber foods in my diet?  Eat a wide variety of high-fiber foods.  Make sure that half of all grains consumed each day are whole grains.  Replace breads and cereals made from refined flour or white flour with whole-grain breads and cereals.  Replace white rice with brown rice, bulgur wheat, or millet.  Start the day with a breakfast that is high in fiber, such as a cereal that contains at least 5 grams of fiber per serving.  Use beans in place of meat in soups, salads, or pasta.  Eat high-fiber snacks, such as berries, raw vegetables, nuts, or popcorn. This information is not intended to replace advice given to you by your health  care provider. Make sure you discuss any questions you have with your health care provider. Document Released: 03/21/2005 Document Revised: 08/27/2015 Document Reviewed:  09/03/2013 Elsevier Interactive Patient Education  [employer].

## 2017-08-10 NOTE — Progress Notes (Signed)
Stacey Gross, [place] [address]  Main: [phone]  Fax: [phone]   Gastroenterology Consultation  Referring Provider:     Guadalupe Maple, MD Primary Care Physician:  Guadalupe Maple, MD Primary Gastroenterologist:  Dr. Vonda Antigua Reason for Consultation:     Constipation, diverticulitis        HPI:   Stacey Gross is a 57 y.o. y/o female referred for consultation & management  by Dr. Guadalupe Maple, MD.  In January 2019, patient had left lower quadrant abdominal pain that led to a CT scan by her primary care provider, that showed severe descending colon diverticulitis.  Patient received outpatient antibiotics, and patient states pain improved after that.  In early March 2019, pain reoccurred, and she went to the ER, but had to wait 6 hours so went back home.  She was seen by her primary care provider in April 2018, and at that time complained of constipation, and persistent left lower quadrant abdominal pain, and was treated with outpatient antibiotics.  No imaging was done at that time.  Patient denies any fever or chills.  She states she finished her antibiotics about 5 days ago, and had a 2-week course.  States pain improved after that, but recurred last night.  Reports the pain is sharp, intermittent, left lower quadrant, with no radiation, not associated with any nausea or vomiting or diarrhea.  No blood in stool.  However, reports one episode of black stool.  Reports intermittent bright red blood per rectum.  Last episode of bright red blood per rectum was 1 month ago, and thinks it might be due to hemorrhoids.  No abdominal pain at this time, last episode of pain was last night.  Patient reports a colonoscopy about 5 to 6 years ago.  Report not available.  States polyps were removed at that time.  No other colonoscopies besides that.  No immediate family history of colon cancer.  No NSAID use.  No heartburn, dysphagia, weight  loss, dyspepsia.    Reports constipation for for 5 months, and inability to have a bowel movement without over-the-counter laxatives during this time.  Is currently on Dulcolax and Senokot.   Past Medical History:  Diagnosis Date  . Asthma   . Cancer ([place])   . Chronic back pain   . Degenerative joint disease (DJD) of hip   . Diabetes mellitus without complication ([place])   . GERD (gastroesophageal reflux disease)    takes Omeprazole daily  . History of bronchitis yrs ago  . History of colon polyps    benign  . History of staph infection 10 yrs ago  . Hyperlipidemia    takes Atorvastatin daily  . Hypertension   . Peripheral vascular disease ([place])   . Tobacco use    0ne pack of cigaretts daily  . Weakness    numbness and tingling in both legs     Past Surgical History:  Procedure Laterality Date  . ABDOMINAL HYSTERECTOMY     total  . APPENDECTOMY    . BACK SURGERY  9-10 yrs ago  . COLONOSCOPY    . PERIPHERAL VASCULAR CATHETERIZATION Left 03/17/2016   Procedure: Lower Extremity Angiography;  Surgeon: Algernon Huxley, MD;  Location: [facility];  Service: Cardiovascular;  Laterality: Left;    Prior to Admission medications   Medication Sig Start Date End Date Taking? Authorizing Provider  atorvastatin (LIPITOR) 20 MG tablet Take 1 tablet (20  mg total) by mouth at bedtime. 02/22/17  Yes Crissman, Jeannette How, MD  bisacodyl (DULCOLAX) 5 MG EC tablet Take 5 mg by mouth at bedtime.   Yes [provider]  Blood Glucose Monitoring Suppl (ONE TOUCH ULTRA SYSTEM KIT) W/DEVICE KIT 1 kit by Does not apply route once. Patient taking differently: 1 kit by Does not apply route daily.  02/03/15  Yes Crissman, Jeannette How, MD  cyclobenzaprine (FLEXERIL) 10 MG tablet Take 1 tablet (10 mg total) by mouth at bedtime. 09/13/16  Yes Crissman, Jeannette How, MD  FLUoxetine (PROZAC) 20 MG tablet Take 1 tablet (20 mg total) by mouth daily. 02/22/17  Yes Guadalupe Maple, MD  glucose blood test strip Use  as instructed 09/13/16  Yes Crissman, Jeannette How, MD  lisinopril (PRINIVIL,ZESTRIL) 10 MG tablet Take 1 tablet (10 mg total) by mouth daily. 02/22/17  Yes Crissman, Jeannette How, MD  meloxicam (MOBIC) 15 MG tablet Take 1 tablet (15 mg total) by mouth daily. 02/22/17  Yes Crissman, Jeannette How, MD  metFORMIN (GLUCOPHAGE) 500 MG tablet Take 2 tablets (1,000 mg total) by mouth 2 (two) times daily. 02/22/17  Yes Crissman, Jeannette How, MD  omeprazole (PRILOSEC) 20 MG capsule Take 1 capsule (20 mg total) by mouth daily. 02/22/17  Yes Crissman, Jeannette How, MD  pioglitazone (ACTOS) 30 MG tablet Take 1 tablet (30 mg total) by mouth daily. 02/22/17  Yes Crissman, Jeannette How, MD  senna (SENOKOT) 8.6 MG TABS tablet Take 2 tablets by mouth daily.   Yes [provider]  traMADol (ULTRAM) 50 MG tablet Take 1 tablet (50 mg total) by mouth every 8 (eight) hours as needed. 04/18/17  Yes Johnson, Megan P, DO  traZODone (DESYREL) 50 MG tablet Take 1 tablet (50 mg total) by mouth at bedtime as needed for sleep. 02/22/17  Yes Crissman, Jeannette How, MD    Family History  Problem Relation Age of Onset  . Hyperlipidemia Mother   . Hyperlipidemia Father   . Melanoma Sister      Social History   Tobacco Use  . Smoking status: Current Every Day Smoker    Packs/day: 1.00    Years: 43.00    Pack years: 43.00    Types: Cigarettes  . Smokeless tobacco: Never Used  Substance Use Topics  . Alcohol use: No  . Drug use: No    Allergies as of 08/10/2017 - Review Complete 08/10/2017  Allergen Reaction Noted  . Doxycycline hyclate Anaphylaxis, Swelling, and Rash   . Latex Rash 09/18/2013  . Vancomycin Rash     Review of Systems:    All systems reviewed and negative except where noted in HPI.   Physical Exam:  BP 133/79   Pulse 72   Temp 97.8 F (36.6 C) (Oral)   Ht 5' 6"  (1.676 m)   Wt 226 lb 12.8 oz (102.9 kg)   BMI 36.61 kg/m  No LMP recorded. Patient has had a hysterectomy. Psych:  Alert and cooperative. Normal mood and  affect. General:   Alert,  Well-developed, well-nourished, pleasant and cooperative in NAD Head:  Normocephalic and atraumatic. Eyes:  Sclera clear, no icterus.   Conjunctiva pink. Ears:  Normal auditory acuity. Nose:  No deformity, discharge, or lesions. Mouth:  No deformity or lesions,oropharynx pink & moist. Neck:  Supple; no masses or thyromegaly. Lungs:  Respirations even and unlabored.  Clear throughout to auscultation.   No wheezes, crackles, or rhonchi. No acute distress. Heart:  Regular rate and rhythm; no murmurs,  clicks, rubs, or gallops. Abdomen:  Normal bowel sounds.  No bruits.  Soft, non-tender and non-distended without masses, hepatosplenomegaly or hernias noted.  No guarding or rebound tenderness.    Msk:  Symmetrical without gross deformities. Good, equal movement & strength bilaterally. Pulses:  Normal pulses noted. Extremities:  No clubbing or edema.  No cyanosis. Neurologic:  Alert and oriented x3;  grossly normal neurologically. Skin:  Intact without significant lesions or rashes. No jaundice. Lymph Nodes:  No significant cervical adenopathy. Psych:  Alert and cooperative. Normal mood and affect.   Labs: CBC    Component Value Date/Time   WBC 9.8 06/07/2017 1114   RBC 4.46 06/07/2017 1114   HGB 13.5 06/07/2017 1114   HGB 12.8 02/22/2017 1150   HCT 40.7 06/07/2017 1114   HCT 39.2 02/22/2017 1150   PLT 285 06/07/2017 1114   PLT 307 02/22/2017 1150   MCV 91.3 06/07/2017 1114   MCV 93 02/22/2017 1150   MCH 30.3 06/07/2017 1114   MCHC 33.2 06/07/2017 1114   RDW 13.3 06/07/2017 1114   RDW 13.0 02/22/2017 1150   LYMPHSABS 1.9 02/22/2017 1150   EOSABS 0.1 02/22/2017 1150   BASOSABS 0.1 02/22/2017 1150   CMP     Component Value Date/Time   NA 137 06/07/2017 1114   NA 141 02/22/2017 1150   K 4.6 06/07/2017 1114   CL 101 06/07/2017 1114   CO2 26 06/07/2017 1114   GLUCOSE 103 (H) 06/07/2017 1114   BUN 16 06/07/2017 1114   BUN 18 02/22/2017 1150    CREATININE 0.99 06/07/2017 1114   CREATININE 0.79 07/02/2013 1422   CALCIUM 9.2 06/07/2017 1114   PROT 7.9 06/07/2017 1114   PROT 6.7 02/22/2017 1150   ALBUMIN 4.3 06/07/2017 1114   ALBUMIN 4.6 02/22/2017 1150   AST 21 06/07/2017 1114   AST 32 02/22/2017 0847   ALT 18 06/07/2017 1114   ALT 19 02/22/2017 0847   ALKPHOS 83 06/07/2017 1114   BILITOT 0.6 06/07/2017 1114   BILITOT 0.3 02/22/2017 1150   GFRNONAA >60 06/07/2017 1114   GFRAA >60 06/07/2017 1114    Imaging Studies: CT abdomen pelvis, Jan 2019 report reviewed.  Assessment and Plan:   DANAIJA ESKRIDGE is a 57 y.o. y/o female has been referred for constipation, and diverticulitis  January 2019 showed severe descending colon diverticulitis Patient had recurrence of pain earlier in May, and reports some pain last night as well.  Therefore, CT scan is indicated to evaluate status of previous diverticulitis, to see if it is completely resolved or still persistent. If no further diverticulitis present, can proceed with colonoscopy, due to episode of diverticulitis, and also due to previous history of polyps, and also due to episode of bright red blood per rectum 1 month ago. Since patient also describe one episode of black stools, EGD can be added to that colonoscopy No anemia on lab work which is reassuring Avoid NSAIDs, and this was discussed  Left lower quadrant pain is intermittent, and improves after bowel movements, and thus might be related to constipation High-fiber diet discussed Patient was encouraged to start taking Metamucil daily as well   Dr Stacey Antigua

## 2017-08-11 ENCOUNTER — Other Ambulatory Visit: Payer: Self-pay

## 2017-08-11 DIAGNOSIS — K5732 Diverticulitis of large intestine without perforation or abscess without bleeding: Secondary | ICD-10-CM

## 2017-08-11 LAB — CREATININE, SERUM
Creatinine, Ser: 1.06 mg/dL — ABNORMAL HIGH (ref 0.57–1.00)
GFR calc Af Amer: 66 mL/min/{1.73_m2} (ref >59)
GFR calc non Af Amer: 58 mL/min/{1.73_m2} — ABNORMAL LOW (ref >59)

## 2017-08-11 NOTE — Progress Notes (Signed)
Was on hold to schedule, will try to re-contact scheduling.

## 2017-08-14 ENCOUNTER — Telehealth: Payer: Self-pay | Admitting: Gastroenterology

## 2017-08-14 DIAGNOSIS — R7989 Other specified abnormal findings of blood chemistry: Secondary | ICD-10-CM

## 2017-08-14 NOTE — Telephone Encounter (Signed)
Pt notified that CT of abdomin/pelvis with contrast ordered for 08/22/17.  Due to elevated creatinine level they will repeat lab a hour before procedure per scheduling. Pt is aware of creatinine level being elevated.  Pt CT is at 11:45am and is to be at [facility] outpatient imaging at 10:45am for lab. To pick up contrast prior to day of appt. Drink one bottle at 9:45am and the 2nd bottle at 10:45am.

## 2017-08-14 NOTE — Telephone Encounter (Signed)
Pt left vm she states the nurse was supposed to set her up  For a CT scan and has not heard please call pt

## 2017-08-22 ENCOUNTER — Ambulatory Visit
Admission: RE | Admit: 2017-08-22 | Discharge: 2017-08-22 | Disposition: A | Discharge status: Home / Self Care | Payer: Medicare Other | Source: Ambulatory Visit | Attending: Gastroenterology | Admitting: Gastroenterology

## 2017-08-22 ENCOUNTER — Ambulatory Visit: Payer: Medicare Other | Admitting: Family Medicine

## 2017-08-22 DIAGNOSIS — I700 Atherosclerosis of aorta: Secondary | ICD-10-CM | POA: Diagnosis not present

## 2017-08-22 DIAGNOSIS — K5732 Diverticulitis of large intestine without perforation or abscess without bleeding: Secondary | ICD-10-CM | POA: Diagnosis not present

## 2017-08-22 DIAGNOSIS — R109 Unspecified abdominal pain: Secondary | ICD-10-CM | POA: Diagnosis not present

## 2017-08-22 MED ORDER — IOPAMIDOL (ISOVUE-300) INJECTION 61%
100.0000 mL | Freq: Once | INTRAVENOUS | Status: AC | PRN
  Administered 2017-08-22: 100 mL via INTRAVENOUS

## 2017-09-01 ENCOUNTER — Telehealth: Payer: Self-pay | Admitting: Gastroenterology

## 2017-09-01 ENCOUNTER — Other Ambulatory Visit: Payer: Self-pay

## 2017-09-01 DIAGNOSIS — K219 Gastro-esophageal reflux disease without esophagitis: Secondary | ICD-10-CM

## 2017-09-01 DIAGNOSIS — Z8719 Personal history of other diseases of the digestive system: Secondary | ICD-10-CM

## 2017-09-01 DIAGNOSIS — Z86010 Personal history of colonic polyps: Secondary | ICD-10-CM

## 2017-09-01 MED ORDER — PEG 3350-KCL-NA BICARB-NACL 420 G PO SOLR: 4000.0000 mL | Freq: Once | ORAL | 0 refills | Status: AC

## 2017-09-01 MED ORDER — BISACODYL 5 MG PO TBEC: 10.0000 mg | DELAYED_RELEASE_TABLET | Freq: Once | ORAL | 0 refills | Status: AC

## 2017-09-01 NOTE — Telephone Encounter (Signed)
Patient called and stated her CT scan looked good and she doesn't want to have a colonoscopy done at this time.

## 2017-09-01 NOTE — Telephone Encounter (Signed)
Pt had called the hospital and canceled produre.

## 2017-09-06 ENCOUNTER — Other Ambulatory Visit: Payer: Self-pay

## 2017-09-06 SURGERY — COLONOSCOPY WITH PROPOFOL
Anesthesia: General

## 2017-09-06 MED ORDER — PEG 3350-KCL-NA BICARB-NACL 420 G PO SOLR: 4000.0000 mL | Freq: Once | ORAL | 0 refills | Status: AC

## 2017-09-06 NOTE — Telephone Encounter (Signed)
I spoke with pt and she did agree to have colonoscopy done. I explained why she needed it. Scheduled for 09/20/17 at [facility]. Will mail prep information. I also went over prep instructions.

## 2017-09-11 ENCOUNTER — Other Ambulatory Visit: Payer: Self-pay

## 2017-09-11 ENCOUNTER — Ambulatory Visit: Payer: Medicare Other | Admitting: Family Medicine

## 2017-09-11 DIAGNOSIS — Z86010 Personal history of colonic polyps: Secondary | ICD-10-CM

## 2017-09-11 DIAGNOSIS — K219 Gastro-esophageal reflux disease without esophagitis: Secondary | ICD-10-CM

## 2017-09-11 DIAGNOSIS — Z8719 Personal history of other diseases of the digestive system: Secondary | ICD-10-CM

## 2017-09-14 ENCOUNTER — Other Ambulatory Visit: Payer: Self-pay

## 2017-09-14 ENCOUNTER — Other Ambulatory Visit: Payer: Self-pay | Admitting: Family Medicine

## 2017-09-14 DIAGNOSIS — F331 Major depressive disorder, recurrent, moderate: Secondary | ICD-10-CM

## 2017-09-14 NOTE — Telephone Encounter (Signed)
trazodone refill Last Refill:02/22/17 # 30 tab 5RF Last OV: 09/13/16 PCP: Dr. Jeananne Rama Pharmacy:[facility].

## 2017-09-15 ENCOUNTER — Other Ambulatory Visit: Payer: Self-pay

## 2017-09-19 ENCOUNTER — Encounter: Payer: Self-pay | Admitting: *Deleted

## 2017-09-20 ENCOUNTER — Ambulatory Visit: Payer: Medicare Other | Admitting: Family

## 2017-09-20 ENCOUNTER — Other Ambulatory Visit: Payer: Self-pay

## 2017-09-20 ENCOUNTER — Ambulatory Visit
Admission: RE | Admit: 2017-09-20 | Discharge: 2017-09-20 | Disposition: A | Discharge status: Home / Self Care | Payer: Medicare Other | Source: Ambulatory Visit | Attending: Gastroenterology | Admitting: Gastroenterology

## 2017-09-20 ENCOUNTER — Encounter
Admission: RE | Disposition: A | Discharge status: Home / Self Care | Payer: Self-pay | Source: Ambulatory Visit | Attending: Gastroenterology

## 2017-09-20 ENCOUNTER — Encounter: Payer: Self-pay | Admitting: Anesthesiology

## 2017-09-20 DIAGNOSIS — D125 Benign neoplasm of sigmoid colon: Secondary | ICD-10-CM | POA: Insufficient documentation

## 2017-09-20 DIAGNOSIS — K5790 Diverticulosis of intestine, part unspecified, without perforation or abscess without bleeding: Secondary | ICD-10-CM | POA: Diagnosis not present

## 2017-09-20 DIAGNOSIS — Z86010 Personal history of colonic polyps: Secondary | ICD-10-CM | POA: Diagnosis not present

## 2017-09-20 DIAGNOSIS — K2950 Unspecified chronic gastritis without bleeding: Secondary | ICD-10-CM | POA: Insufficient documentation

## 2017-09-20 DIAGNOSIS — K2960 Other gastritis without bleeding: Secondary | ICD-10-CM | POA: Diagnosis not present

## 2017-09-20 DIAGNOSIS — Z791 Long term (current) use of non-steroidal anti-inflammatories (NSAID): Secondary | ICD-10-CM | POA: Diagnosis not present

## 2017-09-20 DIAGNOSIS — M329 Systemic lupus erythematosus, unspecified: Secondary | ICD-10-CM | POA: Insufficient documentation

## 2017-09-20 DIAGNOSIS — K219 Gastro-esophageal reflux disease without esophagitis: Secondary | ICD-10-CM | POA: Diagnosis not present

## 2017-09-20 DIAGNOSIS — Z7984 Long term (current) use of oral hypoglycemic drugs: Secondary | ICD-10-CM | POA: Diagnosis not present

## 2017-09-20 DIAGNOSIS — E1151 Type 2 diabetes mellitus with diabetic peripheral angiopathy without gangrene: Secondary | ICD-10-CM | POA: Diagnosis not present

## 2017-09-20 DIAGNOSIS — Z79899 Other long term (current) drug therapy: Secondary | ICD-10-CM | POA: Diagnosis not present

## 2017-09-20 DIAGNOSIS — Z8719 Personal history of other diseases of the digestive system: Secondary | ICD-10-CM

## 2017-09-20 DIAGNOSIS — D122 Benign neoplasm of ascending colon: Secondary | ICD-10-CM

## 2017-09-20 DIAGNOSIS — D123 Benign neoplasm of transverse colon: Secondary | ICD-10-CM | POA: Diagnosis not present

## 2017-09-20 DIAGNOSIS — Z881 Allergy status to other antibiotic agents status: Secondary | ICD-10-CM | POA: Insufficient documentation

## 2017-09-20 DIAGNOSIS — E785 Hyperlipidemia, unspecified: Secondary | ICD-10-CM | POA: Insufficient documentation

## 2017-09-20 DIAGNOSIS — K635 Polyp of colon: Secondary | ICD-10-CM

## 2017-09-20 DIAGNOSIS — K921 Melena: Secondary | ICD-10-CM | POA: Diagnosis not present

## 2017-09-20 DIAGNOSIS — D127 Benign neoplasm of rectosigmoid junction: Secondary | ICD-10-CM | POA: Diagnosis not present

## 2017-09-20 DIAGNOSIS — J449 Chronic obstructive pulmonary disease, unspecified: Secondary | ICD-10-CM | POA: Insufficient documentation

## 2017-09-20 DIAGNOSIS — K319 Disease of stomach and duodenum, unspecified: Secondary | ICD-10-CM | POA: Diagnosis not present

## 2017-09-20 DIAGNOSIS — K259 Gastric ulcer, unspecified as acute or chronic, without hemorrhage or perforation: Secondary | ICD-10-CM | POA: Diagnosis not present

## 2017-09-20 DIAGNOSIS — K3189 Other diseases of stomach and duodenum: Secondary | ICD-10-CM

## 2017-09-20 DIAGNOSIS — K5730 Diverticulosis of large intestine without perforation or abscess without bleeding: Secondary | ICD-10-CM

## 2017-09-20 DIAGNOSIS — Q402 Other specified congenital malformations of stomach: Secondary | ICD-10-CM | POA: Diagnosis not present

## 2017-09-20 DIAGNOSIS — F17210 Nicotine dependence, cigarettes, uncomplicated: Secondary | ICD-10-CM | POA: Insufficient documentation

## 2017-09-20 DIAGNOSIS — I10 Essential (primary) hypertension: Secondary | ICD-10-CM | POA: Insufficient documentation

## 2017-09-20 DIAGNOSIS — Z1211 Encounter for screening for malignant neoplasm of colon: Secondary | ICD-10-CM | POA: Diagnosis present

## 2017-09-20 HISTORY — PX: COLONOSCOPY WITH PROPOFOL: SHX5780

## 2017-09-20 HISTORY — PX: ESOPHAGOGASTRODUODENOSCOPY (EGD) WITH PROPOFOL: SHX5813

## 2017-09-20 LAB — GLUCOSE, CAPILLARY: Glucose-Capillary: 140 mg/dL — ABNORMAL HIGH (ref 65–99)

## 2017-09-20 SURGERY — COLONOSCOPY WITH PROPOFOL
Anesthesia: General

## 2017-09-20 MED ORDER — SODIUM CHLORIDE 0.9 % IV SOLN
INTRAVENOUS | Status: DC
  Administered 2017-09-20: 08:00:00 via INTRAVENOUS

## 2017-09-20 MED ORDER — PROPOFOL 10 MG/ML IV BOLUS
INTRAVENOUS | Status: DC | PRN
  Administered 2017-09-20: 160 mg via INTRAVENOUS
  Administered 2017-09-20 (×2): 20 mg via INTRAVENOUS
  Administered 2017-09-20: 30 mg via INTRAVENOUS
  Administered 2017-09-20: 20 mg via INTRAVENOUS

## 2017-09-20 MED ORDER — PROPOFOL 500 MG/50ML IV EMUL
INTRAVENOUS | Status: AC
  Filled 2017-09-20: qty 50

## 2017-09-20 MED ORDER — PROPOFOL 500 MG/50ML IV EMUL
INTRAVENOUS | Status: DC | PRN
  Administered 2017-09-20: 120 ug/kg/min via INTRAVENOUS

## 2017-09-20 MED ORDER — PROPOFOL 10 MG/ML IV BOLUS
INTRAVENOUS | Status: AC
  Filled 2017-09-20: qty 20

## 2017-09-20 NOTE — Op Note (Signed)
[facility] Gastroenterology Patient Name: Stacey Gross Procedure Date: 09/20/2017 7:49 AM MRN: [id number] Account #: [id number] Date of Birth: 27-Apr-1960 Admit Type: Outpatient Age: 57 Room: [facility] ENDO ROOM 2 Gender: Female Note Status: Finalized Procedure:            Colonoscopy Indications:          High risk colon cancer surveillance: Personal history                        of colonic polyps, Incidental - Diverticulitis Providers:            Eian Vandervelden B. Bonna Gains MD, MD Referring MD:         Guadalupe Maple, MD (Referring MD) Medicines:            Monitored Anesthesia Care Complications:        No immediate complications. Procedure:            Pre-Anesthesia Assessment:                       - ASA Grade Assessment: III - A patient with severe                        systemic disease.                       - Prior to the procedure, a History and Physical was                        performed, and patient medications, allergies and                        sensitivities were reviewed. The patient's tolerance of                        previous anesthesia was reviewed.                       - The risks and benefits of the procedure and the                        sedation options and risks were discussed with the                        patient. All questions were answered and informed                        consent was obtained.                       - Patient identification and proposed procedure were                        verified prior to the procedure by the physician, the                        nurse, the anesthesiologist, the anesthetist and the                        technician. The procedure was verified in the procedure  room.                       After obtaining informed consent, the colonoscope was                        passed under direct vision. Throughout the procedure,                        the patient's blood pressure, pulse,  and oxygen                        saturations were monitored continuously. The                        Colonoscope was introduced through the anus and                        advanced to the the cecum, identified by appendiceal                        orifice and ileocecal valve. The colonoscopy was                        performed with ease. The patient tolerated the                        procedure well. The quality of the bowel preparation                        was fair except the ascending colon was poor and the                        cecum was poor. Findings:      The perianal and digital rectal examinations were normal.      Multiple diverticula were found in the colon.      A 5 mm polyp was found in the ascending colon. The polyp was flat. The       polyp was removed with a cold snare. Resection and retrieval were       complete.      Two sessile, non-bleeding polyps were found in the transverse colon. The       polyps were 4 to 5 mm in size. The polyp was removed with a cold biopsy       forceps and The polyp was removed with a cold snare. Resection was       complete, but the polyp tissue was only partially retrieved. One polyp       was removed completely via cold snare but was not retrieved. The other       was removed via biopsy forceps and was completely retrieved.      A 7 mm polyp was found in the transverse colon. The polyp was flat. The       polyp was removed with a cold snare. Resection and retrieval were       complete.      A 7 mm polyp was found in the sigmoid colon. The polyp was       semi-pedunculated. The polyp was removed with a hot snare. Resection and       retrieval were complete. To prevent bleeding after the polypectomy,  one       hemostatic clip was successfully placed. There was no bleeding at the       end of the procedure.      Three flat polyps were found in the recto-sigmoid colon. The polyps were       3 to 5 mm in size. These polyps were removed  with a cold snare.       Resection and retrieval were complete.      The exam was otherwise without abnormality.      The rectum, sigmoid colon, descending colon, transverse colon, ascending       colon and cecum appeared normal.      The retroflexed view of the distal rectum and anal verge was normal and       showed no anal or rectal abnormalities. Impression:           - Diverticulosis.                       - One 5 mm polyp in the ascending colon, removed with a                        cold snare. Resected and retrieved.                       - Two 4 to 5 mm, non-bleeding polyps in the transverse                        colon, removed with a cold snare and removed with a                        cold biopsy forceps. Complete resection. Partial                        retrieval.                       - One 7 mm polyp in the transverse colon, removed with                        a cold snare. Resected and retrieved.                       - One 7 mm polyp in the sigmoid colon, removed with a                        hot snare. Resected and retrieved. Clip was placed.                       - Three 3 to 5 mm polyps at the recto-sigmoid colon,                        removed with a cold snare. Resected and retrieved.                       - The examination was otherwise normal.                       - The rectum, sigmoid colon, descending colon,  transverse colon, ascending colon and cecum are normal.                       - The distal rectum and anal verge are normal on                        retroflexion view. Recommendation:       - Discharge patient to home (with escort).                       - High fiber diet.                       - Advance diet as tolerated.                       - Continue present medications.                       - Await pathology results.                       - Repeat colonoscopy in 6 months for surveillance with                        2 day  prep due to fair prep on today's exam.                       - The findings and recommendations were discussed with                        the patient.                       - The findings and recommendations were discussed with                        the patient's family.                       - Return to primary care physician as previously                        scheduled. Procedure Code(s):    --- Professional ---                       [phone], Colonoscopy, flexible; with removal of tumor(s),                        polyp(s), or other lesion(s) by snare technique Diagnosis Code(s):    --- Professional ---                       Z86.010, Personal history of colonic polyps                       D12.2, Benign neoplasm of ascending colon                       D12.3, Benign neoplasm of transverse colon (hepatic                        flexure or splenic  flexure)                       D12.5, Benign neoplasm of sigmoid colon                       D12.7, Benign neoplasm of rectosigmoid junction                       K57.30, Diverticulosis of large intestine without                        perforation or abscess without bleeding CPT copyright 2017 American Medical Association. All rights reserved. The codes documented in this report are preliminary and upon coder review may  be revised to meet current compliance requirements.  Vonda Antigua, MD Margretta Sidle B. Bonna Gains MD, MD 09/20/2017 9:37:48 AM This report has been signed electronically. Number of Addenda: 0 Note Initiated On: 09/20/2017 7:49 AM Scope Withdrawal Time: 0 hours 44 minutes 0 seconds  Total Procedure Duration: 0 hours 51 minutes 41 seconds  Estimated Blood Loss: Estimated blood loss: none.      [facility]

## 2017-09-20 NOTE — Anesthesia Postprocedure Evaluation (Signed)
Anesthesia Post Note  Patient: Stacey Gross  Procedure(s) Performed: COLONOSCOPY WITH PROPOFOL (N/A ) ESOPHAGOGASTRODUODENOSCOPY (EGD) WITH PROPOFOL (N/A )  Patient location during evaluation: PACU Anesthesia Type: General Level of consciousness: awake, awake and alert and oriented Pain management: pain level controlled Vital Signs Assessment: post-procedure vital signs reviewed and stable Respiratory status: spontaneous breathing Cardiovascular status: blood pressure returned to baseline Anesthetic complications: no     Last Vitals:  Vitals:   09/20/17 0747 09/20/17 0937  BP: (!) 183/80 (!) 128/52  Pulse: 65   Resp: 20 16  Temp: (!) 35.4 C (!) 36.1 C  SpO2: 98% 95%    Last Pain:  Vitals:   09/20/17 0937  TempSrc: Tympanic  PainSc: 0-No pain                 Paz Winsett Marilynn Rail

## 2017-09-20 NOTE — Anesthesia Preprocedure Evaluation (Addendum)
Anesthesia Evaluation  Patient identified by MRN, date of birth, ID band Patient awake    Reviewed: Allergy & Precautions, H&P , NPO status , Patient's Chart, lab work & pertinent test results, reviewed documented beta blocker date and time   History of Anesthesia Complications Negative for: history of anesthetic complications  Airway Mallampati: I  TM Distance: >3 FB Neck ROM: full    Dental  (+) Dental Advidsory Given, Edentulous Upper, Missing   Pulmonary neg shortness of breath, asthma , neg sleep apnea, COPD,  COPD inhaler, neg recent URI, Current Smoker,           Cardiovascular Exercise Tolerance: Good hypertension, (-) angina+ Peripheral Vascular Disease  (-) CAD, (-) Past MI, (-) Cardiac Stents and (-) CABG (-) dysrhythmias (-) Valvular Problems/Murmurs     Neuro/Psych PSYCHIATRIC DISORDERS Depression negative neurological ROS     GI/Hepatic Neg liver ROS, GERD  ,  Endo/Other  diabetes  Renal/GU negative Renal ROS  negative genitourinary   Musculoskeletal   Abdominal   Peds  Hematology negative hematology ROS (+)   Anesthesia Other Findings Past Medical History: No date: Asthma No date: Cancer ([place]) No date: Chronic back pain No date: Degenerative joint disease (DJD) of hip No date: Diabetes mellitus without complication (HCC) No date: GERD (gastroesophageal reflux disease)     Comment:  takes Omeprazole daily yrs ago: History of bronchitis No date: History of colon polyps     Comment:  benign 10 yrs ago: History of staph infection No date: Hyperlipidemia     Comment:  takes Atorvastatin daily No date: Hypertension No date: Peripheral vascular disease (HCC) No date: Tobacco use     Comment:  0ne pack of cigaretts daily No date: Weakness     Comment:  numbness and tingling in both legs    Reproductive/Obstetrics negative OB ROS                            Anesthesia  Physical Anesthesia Plan  ASA: III  Anesthesia Plan: General   Post-op Pain Management:    Induction: Intravenous  PONV Risk Score and Plan: 2 and Propofol infusion  Airway Management Planned: Nasal Cannula  Additional Equipment:   Intra-op Plan:   Post-operative Plan:   Informed Consent: I have reviewed the patients History and Physical, chart, labs and discussed the procedure including the risks, benefits and alternatives for the proposed anesthesia with the patient or authorized representative who has indicated his/her understanding and acceptance.   Dental Advisory Given  Plan Discussed with: Anesthesiologist, CRNA and Surgeon  Anesthesia Plan Comments:         Anesthesia Quick Evaluation

## 2017-09-20 NOTE — Op Note (Signed)
[facility] Gastroenterology Patient Name: Stacey Gross Procedure Date: 09/20/2017 7:51 AM MRN: [id number] Account #: [id number] Date of Birth: 09/10/1960 Admit Type: Outpatient Age: 57 Room: [facility] ENDO ROOM 2 Gender: Female Note Status: Finalized Procedure:            Upper GI endoscopy Indications:          Melena Providers:            Janiaya Ryser B. Bonna Gains MD, MD Referring MD:         Guadalupe Maple, MD (Referring MD) Medicines:            Monitored Anesthesia Care Complications:        No immediate complications. Procedure:            Pre-Anesthesia Assessment:                       - Prior to the procedure, a History and Physical was                        performed, and patient medications, allergies and                        sensitivities were reviewed. The patient's tolerance of                        previous anesthesia was reviewed.                       - The risks and benefits of the procedure and the                        sedation options and risks were discussed with the                        patient. All questions were answered and informed                        consent was obtained.                       - Patient identification and proposed procedure were                        verified prior to the procedure by the physician, the                        nurse, the anesthesiologist, the anesthetist and the                        technician. The procedure was verified in the procedure                        room.                       - ASA Grade Assessment: III - A patient with severe                        systemic disease.                       After  obtaining informed consent, the endoscope was                        passed under direct vision. Throughout the procedure,                        the patient's blood pressure, pulse, and oxygen                        saturations were monitored continuously. The Endoscope   was introduced through the mouth, and advanced to the                        second part of duodenum. The upper GI endoscopy was                        accomplished with ease. The patient tolerated the                        procedure well. 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 Biopsies were taken with a cold       forceps for histology.      The exam of the esophagus was otherwise normal.      A single localized, non-bleeding erosion was found in the gastric       antrum. There were no stigmata of recent bleeding. Biopsies were taken       with a cold forceps for histology. Biopsies were obtained in the gastric       body, at the incisura and in the gastric antrum with cold forceps for       histology.      Localized mild mucosal changes characterized by thickened folds were       found in the duodenal bulb. Biopsies were taken with a cold forceps for       histology.      The second portion of the duodenum and examined duodenum were normal. Impression:           - Ectopic gastric mucosa in the proximal esophagus.                        Biopsied.                       - Non-bleeding erosive gastropathy. Biopsied.                       - Mucosal changes in the duodenum. Biopsied.                       - Normal second portion of the duodenum and examined                        duodenum.                       - Biopsies were obtained in the gastric body, at the                        incisura and in the gastric antrum. Recommendation:       -  Await pathology results.                       - Discharge patient to home (with escort).                       - Advance diet as tolerated.                       - Continue present medications.                       - Patient has a contact number available for                        emergencies. The signs and symptoms of potential                        delayed  complications were discussed with the patient.                        Return to normal activities tomorrow. Written discharge                        instructions were provided to the patient.                       - Discharge patient to home (with escort).                       - The findings and recommendations were discussed with                        the patient.                       - The findings and recommendations were discussed with                        the patient's family.                       - Take prescribed proton pump inhibitor or H2 blocker                        (antacid) medications 30 - 60 minutes before meals.                       - Avoid NSAIDs except Aspirin 81mg  if medically                        indicated and prescribed by PCP or cardiology Procedure Code(s):    --- Professional ---                       [phone], Esophagogastroduodenoscopy, flexible, transoral;                        with biopsy, single or multiple Diagnosis Code(s):    --- Professional ---                       Q40.2, Other specified congenital malformations of  stomach                       K31.89, Other diseases of stomach and duodenum                       K92.1, Melena (includes Hematochezia) CPT copyright 2017 American Medical Association. All rights reserved. The codes documented in this report are preliminary and upon coder review may  be revised to meet current compliance requirements.  Vonda Antigua, MD Margretta Sidle B. Bonna Gains MD, MD 09/20/2017 8:34:03 AM This report has been signed electronically. Number of Addenda: 0 Note Initiated On: 09/20/2017 7:51 AM Estimated Blood Loss: Estimated blood loss: none.      [facility]

## 2017-09-20 NOTE — Anesthesia Post-op Follow-up Note (Signed)
Anesthesia QCDR form completed.        

## 2017-09-20 NOTE — H&P (Signed)
Stacey Antigua, MD [address] [place], [place], [place], [address] [address], [place], [place], [place], [address] Phone: [phone]  Fax: [phone]  Primary Care Physician:  Guadalupe Maple, MD   Pre-Procedure History & Physical: HPI:  Stacey Gross is a 57 y.o. female is here for a colonoscopy and EGD.   Past Medical History:  Diagnosis Date  . Asthma   . Cancer ([place])   . Chronic back pain   . Degenerative joint disease (DJD) of hip   . Diabetes mellitus without complication ([place])   . GERD (gastroesophageal reflux disease)    takes Omeprazole daily  . History of bronchitis yrs ago  . History of colon polyps    benign  . History of staph infection 10 yrs ago  . Hyperlipidemia    takes Atorvastatin daily  . Hypertension   . Peripheral vascular disease ([place])   . Tobacco use    0ne pack of cigaretts daily  . Weakness    numbness and tingling in both legs     Past Surgical History:  Procedure Laterality Date  . ABDOMINAL HYSTERECTOMY     total  . APPENDECTOMY    . BACK SURGERY  9-10 yrs ago  . COLONOSCOPY    . PERIPHERAL VASCULAR CATHETERIZATION Left 03/17/2016   Procedure: Lower Extremity Angiography;  Surgeon: Algernon Huxley, MD;  Location: [facility];  Service: Cardiovascular;  Laterality: Left;    Prior to Admission medications   Medication Sig Start Date End Date Taking? Authorizing Provider  cyclobenzaprine (FLEXERIL) 10 MG tablet Take 1 tablet (10 mg total) by mouth at bedtime. 09/13/16  Yes Crissman, Jeannette How, MD  atorvastatin (LIPITOR) 20 MG tablet Take 1 tablet (20 mg total) by mouth at bedtime. 02/22/17   Guadalupe Maple, MD  bisacodyl (DULCOLAX) 5 MG EC tablet Take 5 mg by mouth at bedtime.    [provider]  Blood Glucose Monitoring Suppl (ONE TOUCH ULTRA SYSTEM KIT) W/DEVICE KIT 1 kit by Does not apply route once. Patient taking differently: 1 kit by Does not apply route daily.  02/03/15   Guadalupe Maple, MD    FLUoxetine (PROZAC) 20 MG tablet Take 1 tablet (20 mg total) by mouth daily. 02/22/17   Guadalupe Maple, MD  glucose blood test strip Use as instructed 09/13/16   Guadalupe Maple, MD  lisinopril (PRINIVIL,ZESTRIL) 10 MG tablet Take 1 tablet (10 mg total) by mouth daily. 02/22/17   Guadalupe Maple, MD  meloxicam (MOBIC) 15 MG tablet Take 1 tablet (15 mg total) by mouth daily. 02/22/17   Guadalupe Maple, MD  metFORMIN (GLUCOPHAGE) 500 MG tablet Take 2 tablets (1,000 mg total) by mouth 2 (two) times daily. 02/22/17   Guadalupe Maple, MD  omeprazole (PRILOSEC) 20 MG capsule Take 1 capsule (20 mg total) by mouth daily. 02/22/17   Guadalupe Maple, MD  pioglitazone (ACTOS) 30 MG tablet Take 1 tablet (30 mg total) by mouth daily. 02/22/17   Guadalupe Maple, MD  senna (SENOKOT) 8.6 MG TABS tablet Take 2 tablets by mouth daily.    [provider]  traMADol (ULTRAM) 50 MG tablet Take 1 tablet (50 mg total) by mouth every 8 (eight) hours as needed. 04/18/17   Johnson, Megan P, DO  traZODone (DESYREL) 50 MG tablet TAKE ONE TABLET BY MOUTH AT BEDTIME AS NEEDED FOR SLEEP 09/14/17   Guadalupe Maple, MD    Allergies as of 09/12/2017 -  Review Complete 08/22/2017  Allergen Reaction Noted  . Doxycycline hyclate Anaphylaxis, Swelling, and Rash   . Latex Rash 09/18/2013  . Vancomycin Rash     Family History  Problem Relation Age of Onset  . Hyperlipidemia Mother   . Hyperlipidemia Father   . Melanoma Sister     Social History   Socioeconomic History  . Marital status: Widowed    Spouse name: Not on file  . Number of children: 0  . Years of education: Not on file  . Highest education level: Not on file  Occupational History  . Occupation: disabled  Social Needs  . Financial resource strain: Not on file  . Food insecurity:    Worry: Not on file    Inability: Not on file  . Transportation needs:    Medical: Not on file    Non-medical: Not on file  Tobacco Use  . Smoking status:  Current Every Day Smoker    Packs/day: 1.00    Years: 43.00    Pack years: 43.00    Types: Cigarettes  . Smokeless tobacco: Never Used  Substance and Sexual Activity  . Alcohol use: No  . Drug use: No  . Sexual activity: Not on file  Lifestyle  . Physical activity:    Days per week: Not on file    Minutes per session: Not on file  . Stress: Not on file  Relationships  . Social connections:    Talks on phone: Not on file    Gets together: Not on file    Attends religious service: Not on file    Active member of club or organization: Not on file    Attends meetings of clubs or organizations: Not on file    Relationship status: Not on file  . Intimate partner violence:    Fear of current or ex partner: Not on file    Emotionally abused: Not on file    Physically abused: Not on file    Forced sexual activity: Not on file  Other Topics Concern  . Not on file  Social History Narrative  . Not on file    Review of Systems: See HPI, otherwise negative ROS  Physical Exam: BP (!) 183/80   Pulse 65   Temp (!) 95.8 F (35.4 C) (Oral)   Resp 20   Ht _0  (1.702 m)   Wt 224 lb (101.6 kg)   SpO2 98%   BMI 35.08 kg/m  General:   Alert,  pleasant and cooperative in NAD Head:  Normocephalic and atraumatic. Neck:  Supple; no masses or thyromegaly. Lungs:  Clear throughout to auscultation, normal respiratory effort.    Heart:  +S1, +S2, Regular rate and rhythm, No edema. Abdomen:  Soft, nontender and nondistended. Normal bowel sounds, without guarding, and without rebound.   Neurologic:  Alert and  oriented x4;  grossly normal neurologically.  Impression/Plan: Stacey Gross is here for a colonoscopy to be performed for diverticulitis, and history of colon polyps, EGD for melena.  Risks, benefits, limitations, and alternatives regarding the procedures have been reviewed with the patient.  Questions have been answered.  All parties agreeable.   Virgel Manifold, MD   09/20/2017, 8:02 AM

## 2017-09-20 NOTE — Transfer of Care (Signed)
Immediate Anesthesia Transfer of Care Note  Patient: Stacey Gross  Procedure(s) Performed: COLONOSCOPY WITH PROPOFOL (N/A ) ESOPHAGOGASTRODUODENOSCOPY (EGD) WITH PROPOFOL (N/A )  Patient Location: PACU  Anesthesia Type:General  Level of Consciousness: awake, alert  and oriented  Airway & Oxygen Therapy: Patient Spontanous Breathing  Post-op Assessment: Report given to RN  Post vital signs: Reviewed and stable  Last Vitals:  Vitals Value Taken Time  BP 128/52 09/20/2017  9:37 AM  Temp 36.1 C 09/20/2017  9:37 AM  Pulse 66 09/20/2017  9:38 AM  Resp 16 09/20/2017  9:38 AM  SpO2 93 % 09/20/2017  9:38 AM  Vitals shown include unvalidated device data.  Last Pain:  Vitals:   09/20/17 0937  TempSrc: Tympanic  PainSc: 0-No pain         Complications: No apparent anesthesia complications

## 2017-09-21 ENCOUNTER — Encounter: Payer: Self-pay | Admitting: Gastroenterology

## 2017-09-21 LAB — SURGICAL PATHOLOGY

## 2017-09-22 ENCOUNTER — Encounter: Payer: Self-pay | Admitting: Gastroenterology

## 2017-10-04 ENCOUNTER — Other Ambulatory Visit: Payer: Self-pay | Admitting: Family Medicine

## 2017-10-04 NOTE — Telephone Encounter (Signed)
Request refill for cyclobenzaprine 10 mg tab, prescription expired on 09/13/17.  LOV 08/08/17 Dr. Jeananne Rama [facility]

## 2017-10-16 ENCOUNTER — Telehealth: Payer: Self-pay | Admitting: Gastroenterology

## 2017-10-16 NOTE — Telephone Encounter (Signed)
Pt left vm to see about prev. message

## 2017-10-16 NOTE — Telephone Encounter (Signed)
Pt is calling to let Dr. Bonna Gains she still can not go to the bathroom if someone could call her

## 2017-10-18 NOTE — Telephone Encounter (Signed)
Pt states she took laxatives and is going a little. Please advise.

## 2017-10-20 NOTE — Telephone Encounter (Signed)
I spoke with pt and she states she took Senokot 2 tabs. Last night, Fleet enema, Dulcolax 2 tabs last night and is taking Miralax 1 capful everyday with her coffee. States she is drinking lots of fluid. She is not hurting as bad in lower abdomen as she was, and no rectal bleeding from constipation. Does state her stomach is hard. I did advise pt to drink some warm prune juice with her coffee and see if that doesn't help. Please advise.

## 2017-10-23 ENCOUNTER — Telehealth: Payer: Self-pay | Admitting: Family Medicine

## 2017-10-23 MED ORDER — LINACLOTIDE 145 MCG PO CAPS: 145.0000 ug | ORAL_CAPSULE | Freq: Every day | ORAL | 5 refills | Status: DC

## 2017-10-23 NOTE — Telephone Encounter (Signed)
Phone call Patient very frustrated having marked constipation issues will work with GI but also called in some Linzess.

## 2017-10-23 NOTE — Telephone Encounter (Signed)
Copied from [place] [id number]. Topic: Quick Communication - See Telephone Encounter >> Oct 23, 2017 10:02 AM Neva Seat wrote: Pt is having issues with not having a bowel movement. Pt has discussed with the colon doctor's nurse and nothing they advised is working. Pt is needing a call back to see if there is something else to be done. Please call pt back asap.

## 2017-10-27 NOTE — Telephone Encounter (Signed)
Pt.notified

## 2017-11-03 NOTE — Progress Notes (Signed)
Patient needs labs, A1C, and F/U.

## 2017-11-13 ENCOUNTER — Ambulatory Visit: Payer: Medicare Other | Admitting: Gastroenterology

## 2017-11-27 DIAGNOSIS — E119 Type 2 diabetes mellitus without complications: Secondary | ICD-10-CM | POA: Diagnosis not present

## 2017-11-27 DIAGNOSIS — Z0100 Encounter for examination of eyes and vision without abnormal findings: Secondary | ICD-10-CM | POA: Diagnosis not present

## 2018-01-18 ENCOUNTER — Ambulatory Visit: Payer: Medicare Other | Admitting: Family Medicine

## 2018-02-01 ENCOUNTER — Ambulatory Visit (INDEPENDENT_AMBULATORY_CARE_PROVIDER_SITE_OTHER): Payer: Medicare Other | Admitting: Family Medicine

## 2018-02-01 ENCOUNTER — Ambulatory Visit (INDEPENDENT_AMBULATORY_CARE_PROVIDER_SITE_OTHER): Payer: Medicare Other

## 2018-02-01 ENCOUNTER — Encounter: Payer: Self-pay | Admitting: Family Medicine

## 2018-02-01 VITALS — BP 128/82 | HR 74 | Temp 97.9°F | Resp 18 | Ht 67.0 in | Wt 231.0 lb

## 2018-02-01 DIAGNOSIS — Z1239 Encounter for other screening for malignant neoplasm of breast: Secondary | ICD-10-CM | POA: Diagnosis not present

## 2018-02-01 DIAGNOSIS — K219 Gastro-esophageal reflux disease without esophagitis: Secondary | ICD-10-CM | POA: Diagnosis not present

## 2018-02-01 DIAGNOSIS — I10 Essential (primary) hypertension: Secondary | ICD-10-CM | POA: Diagnosis not present

## 2018-02-01 DIAGNOSIS — Z0000 Encounter for general adult medical examination without abnormal findings: Secondary | ICD-10-CM | POA: Diagnosis not present

## 2018-02-01 DIAGNOSIS — Z7189 Other specified counseling: Secondary | ICD-10-CM | POA: Diagnosis not present

## 2018-02-01 MED ORDER — OMEPRAZOLE 20 MG PO CPDR: 20.0000 mg | DELAYED_RELEASE_CAPSULE | Freq: Two times a day (BID) | ORAL | 3 refills | Status: DC

## 2018-02-01 MED ORDER — DICLOFENAC SODIUM 75 MG PO TBEC: 75.0000 mg | DELAYED_RELEASE_TABLET | Freq: Two times a day (BID) | ORAL | 3 refills | Status: DC

## 2018-02-01 NOTE — Patient Instructions (Signed)
Stacey Gross , Thank you for taking time to come for your Medicare Wellness Visit. I appreciate your ongoing commitment to your health goals. Please review the following plan we discussed and let me know if I can assist you in the future.   Screening recommendations/referrals: Colonoscopy: review recommendations from gastroenterology Mammogram: Please call [phone] to schedule your mammogram.  Bone Density: Up to date Recommended yearly ophthalmology/optometry visit for glaucoma screening and checkup Recommended yearly dental visit for hygiene and checkup Mammogram A mammogram is an X-ray of the breasts that is done to check for changes that are not normal. This test can screen for and find any changes that may suggest breast cancer. This test can also help to find other changes and variations in the breast. What happens before the procedure?  Have this test done about 1-2 weeks after your period. This is usually when your breasts are the least tender.  If you are visiting a new doctor or clinic, send any past mammogram images to your new doctor's office.  Wash your breasts and under your arms the day of the test.  Do not use deodorants, perfumes, lotions, or powders on the day of the test.  Take off any jewelry from your neck.  Wear clothes that you can change into and out of easily. What happens during the procedure?  You will undress from the waist up. You will put on a gown.  You will stand in front of the X-ray machine.  Each breast will be placed between two plastic or glass plates. The plates will press down on your breast for a few seconds. Try to stay as relaxed as possible. This does not cause any harm to your breasts. Any discomfort you feel will be very brief.  X-rays will be taken from different angles of each breast. The procedure may vary among doctors and hospitals. What happens after the procedure?  The mammogram will be looked at by a specialist  (radiologist).  You may need to do certain parts of the test again. This depends on the quality of the images.  Ask when your test results will be ready. Make sure you get your test results.  You may go back to your normal activities. This information is not intended to replace advice given to you by your health care provider. Make sure you discuss any questions you have with your health care provider. Document Released: 06/17/2008 Document Revised: 08/27/2015 Document Reviewed: 05/30/2014 Elsevier Interactive Patient Education  2018 [employer].   Vaccinations: Influenza vaccine: due - please contact pharmacy or return to office for high dose flu vaccine Pneumococcal vaccine: next dose due at age 21 Tdap vaccine: Up to date Shingles vaccine: Shingrix discussed  Advanced directives: Advance directive discussed with you today. I have provided a copy for you to complete at home and have notarized. Once this is complete please bring a copy in to our office so we can scan it into your chart.  Conditions/risks identified: smoking cessation disscussed; please contact us if you would like additional information on quitting smoking  Next appointment: Please follow up in one year for your Medicare Annual Wellness visit.   Preventive Care 40-64 Years, Female Preventive care refers to lifestyle choices and visits with your health care provider that can promote health and wellness. What does preventive care include?  A yearly physical exam. This is also called an annual well check.  Dental exams once or twice a year.  Routine eye exams. Ask your health  care provider how often you should have your eyes checked.  Personal lifestyle choices, including:  Daily care of your teeth and gums.  Regular physical activity.  Eating a healthy diet.  Avoiding tobacco and drug use.  Limiting alcohol use.  Practicing safe sex.  Taking low-dose aspirin daily starting at age 27.  Taking vitamin  and mineral supplements as recommended by your health care provider. What happens during an annual well check? The services and screenings done by your health care provider during your annual well check will depend on your age, overall health, lifestyle risk factors, and family history of disease. Counseling  Your health care provider may ask you questions about your:  Alcohol use.  Tobacco use.  Drug use.  Emotional well-being.  Home and relationship well-being.  Sexual activity.  Eating habits.  Work and work Statistician.  Method of birth control.  Menstrual cycle.  Pregnancy history. Screening  You may have the following tests or measurements:  Height, weight, and BMI.  Blood pressure.  Lipid and cholesterol levels. These may be checked every 5 years, or more frequently if you are over 72 years old.  Skin check.  Lung cancer screening. You may have this screening every year starting at age 18 if you have a 30-pack-year history of smoking and currently smoke or have quit within the past 15 years.  Fecal occult blood test (FOBT) of the stool. You may have this test every year starting at age 98.  Flexible sigmoidoscopy or colonoscopy. You may have a sigmoidoscopy every 5 years or a colonoscopy every 10 years starting at age 79.  Hepatitis C blood test.  Hepatitis B blood test.  Sexually transmitted disease (STD) testing.  Diabetes screening. This is done by checking your blood sugar (glucose) after you have not eaten for a while (fasting). You may have this done every 1-3 years.  Mammogram. This may be done every 1-2 years. Talk to your health care provider about when you should start having regular mammograms. This may depend on whether you have a family history of breast cancer.  BRCA-related cancer screening. This may be done if you have a family history of breast, ovarian, tubal, or peritoneal cancers.  Pelvic exam and Pap test. This may be done every 3  years starting at age 68. Starting at age 43, this may be done every 5 years if you have a Pap test in combination with an HPV test.  Bone density scan. This is done to screen for osteoporosis. You may have this scan if you are at high risk for osteoporosis. Discuss your test results, treatment options, and if necessary, the need for more tests with your health care provider. Vaccines  Your health care provider may recommend certain vaccines, such as:  Influenza vaccine. This is recommended every year.  Tetanus, diphtheria, and acellular pertussis (Tdap, Td) vaccine. You may need a Td booster every 10 years.  Zoster vaccine. You may need this after age 31.  Pneumococcal 13-valent conjugate (PCV13) vaccine. You may need this if you have certain conditions and were not previously vaccinated.  Pneumococcal polysaccharide (PPSV23) vaccine. You may need one or two doses if you smoke cigarettes or if you have certain conditions. Talk to your health care provider about which screenings and vaccines you need and how often you need them. This information is not intended to replace advice given to you by your health care provider. Make sure you discuss any questions you have with your health care  provider. Document Released: 04/17/2015 Document Revised: 12/09/2015 Document Reviewed: 01/20/2015 Elsevier Interactive Patient Education  2017 [place] Prevention in the Home Falls can cause injuries. They can happen to people of all ages. There are many things you can do to make your home safe and to help prevent falls. What can I do on the outside of my home?  Regularly fix the edges of walkways and driveways and fix any cracks.  Remove anything that might make you trip as you walk through a door, such as a raised step or threshold.  Trim any bushes or trees on the path to your home.  Use bright outdoor lighting.  Clear any walking paths of anything that might make someone trip,  such as rocks or tools.  Regularly check to see if handrails are loose or broken. Make sure that both sides of any steps have handrails.  Any raised decks and porches should have guardrails on the edges.  Have any leaves, snow, or ice cleared regularly.  Use sand or salt on walking paths during winter.  Clean up any spills in your garage right away. This includes oil or grease spills. What can I do in the bathroom?  Use night lights.  Install grab bars by the toilet and in the tub and shower. Do not use towel bars as grab bars.  Use non-skid mats or decals in the tub or shower.  If you need to sit down in the shower, use a plastic, non-slip stool.  Keep the floor dry. Clean up any water that spills on the floor as soon as it happens.  Remove soap buildup in the tub or shower regularly.  Attach bath mats securely with double-sided non-slip rug tape.  Do not have throw rugs and other things on the floor that can make you trip. What can I do in the bedroom?  Use night lights.  Make sure that you have a light by your bed that is easy to reach.  Do not use any sheets or blankets that are too big for your bed. They should not hang down onto the floor.  Have a firm chair that has side arms. You can use this for support while you get dressed.  Do not have throw rugs and other things on the floor that can make you trip. What can I do in the kitchen?  Clean up any spills right away.  Avoid walking on wet floors.  Keep items that you use a lot in easy-to-reach places.  If you need to reach something above you, use a strong step stool that has a grab bar.  Keep electrical cords out of the way.  Do not use floor polish or wax that makes floors slippery. If you must use wax, use non-skid floor wax.  Do not have throw rugs and other things on the floor that can make you trip. What can I do with my stairs?  Do not leave any items on the stairs.  Make sure that there are  handrails on both sides of the stairs and use them. Fix handrails that are broken or loose. Make sure that handrails are as long as the stairways.  Check any carpeting to make sure that it is firmly attached to the stairs. Fix any carpet that is loose or worn.  Avoid having throw rugs at the top or bottom of the stairs. If you do have throw rugs, attach them to the floor with carpet tape.  Make  sure that you have a light switch at the top of the stairs and the bottom of the stairs. If you do not have them, ask someone to add them for you. What else can I do to help prevent falls?  Wear shoes that:  Do not have high heels.  Have rubber bottoms.  Are comfortable and fit you well.  Are closed at the toe. Do not wear sandals.  If you use a stepladder:  Make sure that it is fully opened. Do not climb a closed stepladder.  Make sure that both sides of the stepladder are locked into place.  Ask someone to hold it for you, if possible.  Clearly mark and make sure that you can see:  Any grab bars or handrails.  First and last steps.  Where the edge of each step is.  Use tools that help you move around (mobility aids) if they are needed. These include:  Canes.  Walkers.  Scooters.  Crutches.  Turn on the lights when you go into a dark area. Replace any light bulbs as soon as they burn out.  Set up your furniture so you have a clear path. Avoid moving your furniture around.  If any of your floors are uneven, fix them.  If there are any pets around you, be aware of where they are.  Review your medicines with your doctor. Some medicines can make you feel dizzy. This can increase your chance of falling. Ask your doctor what other things that you can do to help prevent falls. This information is not intended to replace advice given to you by your health care provider. Make sure you discuss any questions you have with your health care provider. Document Released: 01/15/2009  Document Revised: 08/27/2015 Document Reviewed: 04/25/2014 Elsevier Interactive Patient Education  2017 [employer].

## 2018-02-01 NOTE — Assessment & Plan Note (Signed)
Increase Prilosec and follow-up with GI

## 2018-02-01 NOTE — Progress Notes (Signed)
BP 128/82   Pulse 74   Ht 5\' 7"  (1.702 m)   Wt 231 lb (104.8 kg)   SpO2 94%   BMI 36.18 kg/m    Subjective:    Patient ID: Stacey Gross, female    DOB: 09/12/1960, 57 y.o.   MRN: [id number]  HPI: Stacey Gross is a 57 y.o. female  Chief Complaint  Patient presents with  . Follow-up  Patient for multiple concerns 1 left ankle lateral ankle area pain and heel area pain is been ongoing for a week meloxicam is not helping having exquisite pain with marked tenderness. Patient having reflux issues taking Prilosec 20 mg twice a day which seems to help but is running out once refills. Has questions about her colonoscopy and 6 months repeat reviewed and looks like poor prep the patient will check with GI for further details. Patient had multiple questions about her living will etc. which were discussed in detail. Patient still smoking discussed smoking cessation which patient is not interested in  Relevant past medical, surgical, family and social history reviewed and updated as indicated. Interim medical history since our last visit reviewed. Allergies and medications reviewed and updated.  Review of Systems  Constitutional: Negative.   Respiratory: Negative.   Cardiovascular: Negative.     Per HPI unless specifically indicated above     Objective:    BP 128/82   Pulse 74   Ht 5\' 7"  (1.702 m)   Wt 231 lb (104.8 kg)   SpO2 94%   BMI 36.18 kg/m   Wt Readings from Last 3 Encounters:  02/01/18 231 lb (104.8 kg)  02/01/18 231 lb (104.8 kg)  09/20/17 224 lb (101.6 kg)    Physical Exam  Constitutional: She is oriented to person, place, and time. She appears well-developed and well-nourished.  HENT:  Head: Normocephalic and atraumatic.  Eyes: Conjunctivae and EOM are normal.  Neck: Normal range of motion.  Cardiovascular: Normal rate, regular rhythm and normal heart sounds.  Pulmonary/Chest: Effort normal and breath sounds normal.  Musculoskeletal: Normal range of  motion.  Neurological: She is alert and oriented to person, place, and time.  Skin: No erythema.  Psychiatric: She has a normal mood and affect. Her behavior is normal. Judgment and thought content normal.    Results for orders placed or performed during the hospital encounter of 09/20/17  Glucose, capillary  Result Value Ref Range   Glucose-Capillary 140 (H) 65 - 99 mg/dL  Surgical pathology  Result Value Ref Range   SURGICAL PATHOLOGY      Surgical Pathology CASE: [id number] PATIENT: [facility] Surgical Pathology Report     SPECIMEN SUBMITTED: A. Duodenum, thickened bulb fold; cbx B. Stomach, erosion, r/o h pylori; cbx C. Stomach, atrophic mucosa, 18 cm; cbx D. Colon polyp, ascending; cold snare E. Colon polyp, transverse; cbx F. Colon polyp, transverse; cold snare G. Colon polyp, sigmoid; hot snare H. Colon polyp x3, rectosigmoid; cold snare  CLINICAL HISTORY: None provided  PRE-OPERATIVE DIAGNOSIS: GERD K21.9; hx diverticulitis Z87.19, HX colon polyps Z86.010; hx melena K87.19  POST-OPERATIVE DIAGNOSIS: Gastric erosion, thickened duodenal fold, diverticulosis, polyps     DIAGNOSIS: A.  THICKENED FOLD, DUODENUM BULB; COLD BIOPSY: - DUODENAL MUCOSA WITH PRESERVED VILLOUS ARCHITECTURE AND BRUNNER'S GLAND HYPERPLASIA. - NEGATIVE FOR INTRA-EPITHELIAL LYMPHOCYTOSIS, DYSPLASIA AND MALIGNANCY.  B.  STOMACH EROSION; COLD BIOPSY: - ANTRAL MUCOSA WITH MINIMAL CHRONIC GASTRITIS AND EROSI ON. - OXYNTIC MUCOSA WITH MINIMAL CHRONIC GASTRITIS. - NEGATIVE FOR H. PYLORI, DYSPLASIA, AND  MALIGNANCY.  C.  ATROPHIC MUCOSA, STOMACH, 18 CM; COLD BIOPSY: - SUPERFICIAL SAMPLE OF OXYNTIC MUCOSA WITH MODERATE CHRONIC GASTRITIS WITH MILD ACTIVITY. - MILD FOVEOLAR HYPERPLASIA. - NEGATIVE FOR H. PYLORI, DYSPLASIA AND MALIGNANCY.  Note: Features of atrophy are not seen in this superficial sample.  D.  COLON POLYP, ASCENDING; COLD SNARE: - TUBULAR ADENOMA. - NEGATIVE FOR  HIGH-GRADE DYSPLASIA AND MALIGNANCY.  E.  COLON POLYP, TRANSVERSE; COLD BIOPSY: - TUBULAR ADENOMA. - NEGATIVE FOR HIGH-GRADE DYSPLASIA AND MALIGNANCY.  F.  COLON POLYP, TRANSVERSE; COLD SNARE: - TUBULAR ADENOMA. - NEGATIVE FOR HIGH-GRADE DYSPLASIA AND MALIGNANCY.  G.  COLON POLYP, SIGMOID; HOT SNARE: - TUBULOVILLOUS ADENOMA. - NEGATIVE FOR HIGH-GRADE DYSPLASIA AND MALIGNANCY.  H.  COLON POLYP X3, RECTOSIGMOID; COLD SNARE: - HYPERPLASTIC POLYP (1), NEGATIVE FOR DYSPLASIA AND MALIGNANCY. - NO PA THOLOGIC CHANGE (2).   GROSS DESCRIPTION: A. Labeled: Cbx thickened duodenal bulb fold Received: In formalin Tissue fragment(s): 2 Size: 0.2 and 0.4 cm Description: Tan fragments Entirely submitted in 1 cassette.  B. Labeled: Cbx gastric erosion to rule out H. pylori Received: In formalin Tissue fragment(s): Multiple Size: aggregate, 1.0 x 0.4 x 0.1 cm Description: Pink-Tan fragments Entirely submitted in one cassette.  C. Labeled: Atopic gastric mucosa at 18 cm cbx Received in formalin Tissue fragment(s): 2 Size: 0.3 and 0.4 cm Description: Tan fragments Entirely submitted in one cassette.  D. Labeled: Cold snare polyp ascending colon in formalin Received: In formalin Tissue fragment(s): 1 Size: 0.3 cm Description: Tan fragment Entirely submitted in one cassette.  E. Labeled: Cbx polyp transverse colon Received: In formalin Tissue fragment(s): Multiple Size: Aggregate, 0.8 x 0.2 x 0.1 cm Description: Tan fragments Entirely submitted in one cassette .  F. Labeled: Cold snare transverse polyp: Received: In formalin Tissue fragment(s): Multiple Size: Aggregate, 1.2 x 0.3 x 0.1 cm Description: Tan fragments and fecal material Entirely submitted in one cassette.  G. Labeled: Hot snare polyp sigmoid colon Received: In formalin Tissue fragment(s): Multiple Size: Aggregate, 2.0 x 0.8 x 0.1 cm Description: Purple tissue fragments and fecal material, marked  blue Entirely submitted in one cassette.  H. Labeled: Cold snare polyp x3 rectal sigmoid Received: In formalin Tissue fragment(s): Multiple Size: Aggregate, 2.0 x 1.0 x 0.1 cm Description: Tan fragments and fecal material Entirely submitted in one cassette.    Final Diagnosis performed by Delorse Lek, MD.   Electronically signed 09/21/2017 3:14:21PM Correction performed by Delorse Lek, MD.   Electronically signed 09/21/2017 3:15:06PM The electronic signature indicates that the named Attending Pathologist has evaluated the specimen  Technical component performe d at [facility], [address] [place], [place] [address] Lab: [phone] Dir: Rush Farmer, MD, MMM  Professional component performed at [facility], [facility], [place], [place], [place] [address] Lab: [phone] Dir: Dellia Nims. Reuel Derby, MD       Assessment & Plan:   Problem List Items Addressed This Visit      Cardiovascular and Mediastinum   Hypertension    The current medical regimen is effective;  continue present plan and medications.         Digestive   GERD (gastroesophageal reflux disease)    Increase Prilosec and follow-up with GI      Relevant Medications   omeprazole (PRILOSEC) 20 MG capsule     Other   Advanced care planning/counseling discussion    A voluntary discussion about advanced care planning including explanation and discussion of advanced directives was extentively discussed with the patient.  Explained about  the healthcare proxy and living will was reviewed and packet with forms with expiration of how to fill them out was given.  Time spent: Encounter 16+ min individuals present: Patient       Ankle pain will try diclofenac if not successful will consider orthopedic referral  Follow up plan: Return in about 2 months (around 04/03/2018) for Physical Exam, Hemoglobin A1c.

## 2018-02-01 NOTE — Assessment & Plan Note (Signed)
A voluntary discussion about advanced care planning including explanation and discussion of advanced directives was extentively discussed with the patient.  Explained about the healthcare proxy and living will was reviewed and packet with forms with expiration of how to fill them out was given.  Time spent: Encounter 16+ min individuals present: Patient 

## 2018-02-01 NOTE — Assessment & Plan Note (Signed)
The current medical regimen is effective;  continue present plan and medications.  

## 2018-02-01 NOTE — Progress Notes (Signed)
Subjective:   Stacey Gross is a 57 y.o. female who presents for Medicare Annual (Subsequent) preventive examination.  Review of Systems:   Cardiac Risk Factors include: advanced age (>58mn, >>93women);diabetes mellitus;dyslipidemia;obesity (BMI >30kg/m2);hypertension;smoking/ tobacco exposure     Objective:     Vitals: BP 128/82 (BP Location: Left Arm, Patient Position: Sitting, Cuff Size: Large)   Pulse 74   Temp 97.9 F (36.6 C)   Resp 18   Ht _0  (1.702 m)   Wt 231 lb (104.8 kg)   SpO2 94%   BMI 36.18 kg/m   Body mass index is 36.18 kg/m.  Advanced Directives 02/01/2018 09/20/2017 06/07/2017 01/25/2017 04/08/2016 03/17/2016 03/04/2016  Does Patient Have a Medical Advance Directive? No _1  Yes  Type of Advance Directive - Living will Living will [place] Living will Living will Living will  Copy of [place] Chart? - - - No - copy requested - - -  Would patient like information on creating a medical advance directive? Yes (MAU/Ambulatory/Procedural Areas - Information given) - - - - - -    Tobacco Social History   Tobacco Use  Smoking Status Current Every Day Smoker  . Packs/day: 0.50  . Years: 43.00  . Pack years: 21.50  . Types: Cigarettes  Smokeless Tobacco Never Used     Ready to quit: Not Answered Counseling given: Not Answered   Clinical Intake:  Pre-visit preparation completed: Yes  Pain : 0-10 Pain Score: 6  Pain Type: Acute pain Pain Location: Foot Pain Orientation: Other (Comment)(bilateral heels, left ankle) Pain Descriptors / Indicators: Stabbing Pain Onset: In the past 7 days Pain Frequency: Intermittent Pain Relieving Factors: being off of her feet  Pain Relieving Factors: being off of her feet  Nutritional Status: BMI > 30  Obese Nutritional Risks: None Diabetes: Yes CBG done?: No Did pt. bring in CBG monitor from home?: No   Nutrition Risk Assessment:  Has  the patient had any N/V/D within the last 2 months?  No  Does the patient have any non-healing wounds?  No  Has the patient had any unintentional weight loss or weight gain?  No   Diabetes:  Is the patient diabetic?  Yes  If diabetic, was a CBG obtained today?  No  Did the patient bring in their glucometer from home?  No  How often do you monitor your CBG's? Once daily.   Financial Strains and Diabetes Management:  Are you having any financial strains with the device, your supplies or your medication? No .  Does the patient want to be seen by Chronic Care Management for management of their diabetes?  No  Would the patient like to be referred to a Nutritionist or for Diabetic Management?  No   Diabetic Exams:  Diabetic Eye Exam: Completed per patient. Will obtain result from company that went to patients home [facility]  Diabetic Foot Exam: due. Dr. CJeananne Ramato complete.  How often do you need to have someone help you when you read instructions, pamphlets, or other written materials from your doctor or pharmacy?: 1 - Never What is the last grade level you completed in school?: 8th grade  Interpreter Needed?: No  Information entered by :: KClemetine MarkerLPN  Past Medical History:  Diagnosis Date  . Asthma   . Cancer ([place]   . Chronic back pain   . Degenerative joint disease (DJD) of hip   .  Diabetes mellitus without complication ([place])   . GERD (gastroesophageal reflux disease)    takes Omeprazole daily  . History of bronchitis yrs ago  . History of colon polyps    benign  . History of staph infection 10 yrs ago  . Hyperlipidemia    takes Atorvastatin daily  . Hypertension   . Peripheral vascular disease ([place])   . Tobacco use    0ne pack of cigaretts daily  . Weakness    numbness and tingling in both legs    Past Surgical History:  Procedure Laterality Date  . ABDOMINAL HYSTERECTOMY     total  . APPENDECTOMY    . BACK SURGERY  9-10 yrs ago  . COLONOSCOPY      . COLONOSCOPY WITH PROPOFOL N/A 09/20/2017   Procedure: COLONOSCOPY WITH PROPOFOL;  Surgeon: Virgel Manifold, MD;  Location: [facility] END[facility];  Service: Endoscopy;  Laterality: N/A;  . ESOPHAGOGASTRODUODENOSCOPY (EGD) WITH PROPOFOL N/A 09/20/2017   Procedure: ESOPHAGOGASTRODUODENOSCOPY (EGD) WITH PROPOFOL;  Surgeon: Virgel Manifold, MD;  Location: [facility] ENDOSCOPY;  Service: Endoscopy;  Laterality: N/A;  . PERIPHERAL VASCULAR CATHETERIZATION Left 03/17/2016   Procedure: Lower Extremity Angiography;  Surgeon: Algernon Huxley, MD;  Location: [facility];  Service: Cardiovascular;  Laterality: Left;   Family History  Problem Relation Age of Onset  . Hyperlipidemia Mother   . Hyperlipidemia Father   . Melanoma Sister    Social History   Socioeconomic History  . Marital status: Widowed    Spouse name: Not on file  . Number of children: 0  . Years of education: 8  . Highest education level: Not on file  Occupational History  . Occupation: disabled  Social Needs  . Financial resource strain: Not very hard  . Food insecurity:    Worry: Never true    Inability: Never true  . Transportation needs:    Medical: No    Non-medical: No  Tobacco Use  . Smoking status: Current Every Day Smoker    Packs/day: 0.50    Years: 43.00    Pack years: 21.50    Types: Cigarettes  . Smokeless tobacco: Never Used  Substance and Sexual Activity  . Alcohol use: No  . Drug use: No  . Sexual activity: Not on file  Lifestyle  . Physical activity:    Days per week: 0 days    Minutes per session: 0 min  . Stress: Patient refused  Relationships  . Social connections:    Talks on phone: More than three times a week    Gets together: More than three times a week    Attends religious service: Never    Active member of club or organization: No    Attends meetings of clubs or organizations: Never    Relationship status: Widowed  Other Topics Concern  . Not on file  Social History Narrative   . Not on file    Outpatient Encounter Medications as of 02/01/2018  Medication Sig  . atorvastatin (LIPITOR) 20 MG tablet Take 1 tablet (20 mg total) by mouth at bedtime.  . bisacodyl (DULCOLAX) 5 MG EC tablet Take 5 mg by mouth at bedtime.  . cyclobenzaprine (FLEXERIL) 10 MG tablet TAKE ONE TABLET BY MOUTH AT BEDTIME.  Marland Kitchen FLUoxetine (PROZAC) 20 MG capsule   . FLUoxetine (PROZAC) 20 MG tablet Take 1 tablet (20 mg total) by mouth daily.  Marland Kitchen glucose blood test strip Use as instructed  . linaclotide (LINZESS) 145 MCG CAPS capsule Take  1 capsule (145 mcg total) by mouth daily before breakfast.  . lisinopril (PRINIVIL,ZESTRIL) 10 MG tablet Take 1 tablet (10 mg total) by mouth daily.  . meloxicam (MOBIC) 15 MG tablet Take 1 tablet (15 mg total) by mouth daily.  . metFORMIN (GLUCOPHAGE) 500 MG tablet Take 2 tablets (1,000 mg total) by mouth 2 (two) times daily.  Marland Kitchen omeprazole (PRILOSEC) 20 MG capsule Take 1 capsule (20 mg total) by mouth daily.  . pioglitazone (ACTOS) 30 MG tablet Take 1 tablet (30 mg total) by mouth daily.  Marland Kitchen senna (SENOKOT) 8.6 MG TABS tablet Take 2 tablets by mouth daily.  . traZODone (DESYREL) 50 MG tablet TAKE ONE TABLET BY MOUTH AT BEDTIME AS NEEDED FOR SLEEP  . Blood Glucose Monitoring Suppl (ONE TOUCH ULTRA SYSTEM KIT) W/DEVICE KIT 1 kit by Does not apply route once. (Patient taking differently: 1 kit by Does not apply route daily. )  . traMADol (ULTRAM) 50 MG tablet Take 1 tablet (50 mg total) by mouth every 8 (eight) hours as needed. (Patient not taking: Reported on 02/01/2018)   No facility-administered encounter medications on file as of 02/01/2018.     Activities of Daily Living In your present state of health, do you have any difficulty performing the following activities: 02/01/2018  Hearing? N  Comment declines hearing aids  Vision? N  Comment no glasses  Difficulty concentrating or making decisions? N  Walking or climbing stairs? N  Dressing or bathing? N   Doing errands, shopping? N  Preparing Food and eating ? N  Using the Toilet? N  In the past six months, have you accidently leaked urine? N  Do you have problems with loss of bowel control? N  Managing your Medications? N  Managing your Finances? N  Housekeeping or managing your Housekeeping? N  Some recent data might be hidden    Patient Care Team: Guadalupe Maple, MD as PCP - General (Family Medicine) Guadalupe Maple, MD (Family Medicine)    Assessment:   This is a routine wellness examination for Donni.  Exercise Activities and Dietary recommendations Current Exercise Habits: The patient does not participate in regular exercise at present, Exercise limited by: orthopedic condition(s)(back pain)  Goals    . Quit smoking / using tobacco     Smoking cessation discussed        Fall Risk Fall Risk  02/01/2018 08/08/2017 02/22/2017 01/25/2017 11/14/2016  Falls in the past year? _0   Number falls in past yr: - - - - -  Injury with Fall? - - - - -  Risk for fall due to : - - - - -  Risk for fall due to: Comment - - - - -  Follow up - - - - -   FALL [place]:  Any stairs in or around the home WITH handrails? Yes  Home free of loose throw rugs in walkways, pet beds, electrical cords, etc? Yes  Adequate lighting in your home to reduce risk of falls? Yes   ASSISTIVE DEVICES UTILIZED TO PREVENT FALLS:  Life alert? No  Use of a cane, walker or w/c? No  Grab bars in the bathroom? No  Shower chair or bench in shower? No  Elevated toilet seat or a handicapped toilet? Yes   DME ORDERS:  DME order needed?  No   TIMED UP AND GO:  Was the test performed? YES   Length of time to ambulate 10 feet:  7 sec.   GAIT:  Appearance of gait: Gait stead-fast andnwithout the use of an assistive device. Education: Fall risk prevention has been discussed.  Intervention(s) required? No   Depression Screen PHQ 2/9 Scores 02/01/2018 08/08/2017  02/22/2017 01/25/2017  PHQ - 2 Score - 0 0 0  PHQ- 9 Score - - - 0  Exception Documentation Patient refusal - - -     Cognitive Function     6CIT Screen 02/01/2018 01/25/2017  What Year? 0 points 0 points  What month? 0 points 0 points  What time? 0 points 0 points  Count back from 20 0 points 0 points  Months in reverse 0 points 0 points  Repeat phrase 0 points 0 points  Total Score 0 0    Immunization History  Administered Date(s) Administered  . Influenza,inj,Quad PF,6+ Mos 02/09/2016, 02/22/2017  . Pneumococcal Polysaccharide-23 02/09/2016  . Td 04/04/2004, 05/13/2014    Qualifies for Shingles Vaccine? Yes  Due for Shingrix. Education has been provided regarding the importance of this vaccine. Pt has been advised to call insurance company to determine out of pocket expense. Advised may also receive vaccine at local pharmacy or Health Dept. Verbalized acceptance and understanding.  Tdap: Up to date  Flu Vaccine: Due for Flu vaccine. Does the patient want to receive this vaccine today?  Yes . Education has been provided regarding the importance of this vaccine but still declined. Advised may receive this vaccine at local pharmacy or Health Dept. Aware to provide a copy of the vaccination record if obtained from local pharmacy or Health Dept. Verbalized acceptance and understanding. Pt aware office out of stock of high dose vaccine today and plans to contact pharmacy or return to office for nurse visit.   Pneumococcal Vaccine: Up to date    Screening Tests Health Maintenance  Topic Date Due  . OPHTHALMOLOGY EXAM  03/15/2015  . MAMMOGRAM  11/19/2015  . FOOT EXAM  08/11/2017  . HEMOGLOBIN A1C  08/22/2017  . INFLUENZA VACCINE  11/02/2017  . Hepatitis C Screening  02/02/2019 (Originally 10/08/1960)  . HIV Screening  02/02/2019 (Originally 05/06/1975)  . COLONOSCOPY  09/21/2018  . TETANUS/TDAP  05/13/2024  . PNEUMOCOCCAL POLYSACCHARIDE VACCINE AGE 74-64 HIGH RISK  Completed       Cancer Screenings:  Colorectal Screening: Completed 09/20/17. Repeat every 5 years;   Mammogram:  Ordered today. Pt provided with contact info and advised to call to schedule appt.   Bone Density: Completed 11/07/13.   Lung Cancer Screening: (Low Dose CT Chest recommended if Age 92-80 years, 30 pack-year currently smoking OR have quit w/in 15years.) does qualify. Pt declines for now.    Additional Screening:  Hepatitis C Screening: does qualify; declined  Vision Screening: Recommended annual ophthalmology exams for early detection of glaucoma and other disorders of the eye. Is the patient up to date with their annual eye exam?  Yes  Who is the provider or what is the name of the office in which the pt attends annual eye exams? Pt states exam done at home by company provided by [facility].  Dental Screening: Recommended annual dental exams for proper oral hygiene  Community Resource Referral:  CRR required this visit?  No   Plan:      I have personally reviewed and addressed the Medicare Annual Wellness questionnaire and have noted the following in the patient's chart:  A. Medical and social history B. Use of alcohol, tobacco or illicit drugs  C. Current medications and supplements D.  Functional ability and status E.  Nutritional status F.  Physical activity G. Advance directives H. List of other physicians I.  Hospitalizations, surgeries, and ER visits in previous 12 months J.  [place] such as hearing and vision if needed, cognitive and depression L. Referrals and appointments   In addition, I have reviewed and discussed with patient certain preventive protocols, quality metrics, and best practice recommendations. A written personalized care plan for preventive services as well as general preventive health recommendations were provided to patient.   Signed,  Clemetine Marker, LPN Nurse Health Advisor   Nurse Notes: Pt refused PHQ "I think those questions are  stupid" She is scheduled to see Dr. Jeananne Rama today regarding constipation. She also c/o bilateral stabbing foot pain; needs diabetic foot exam. Mammogram referral sent today. Patient also stated she is supposed to have another colonoscopy in 6 months (Dec 2019) per note from GI this is true but no orders have been placed. She also needs the high dose flu vaccine but office is currently out of stock, she will contact pharmacy or return for nurse visit.

## 2018-03-13 ENCOUNTER — Other Ambulatory Visit: Payer: Self-pay | Admitting: Family Medicine

## 2018-03-13 ENCOUNTER — Telehealth: Payer: Self-pay | Admitting: Family Medicine

## 2018-03-13 DIAGNOSIS — K219 Gastro-esophageal reflux disease without esophagitis: Secondary | ICD-10-CM

## 2018-03-13 DIAGNOSIS — F331 Major depressive disorder, recurrent, moderate: Secondary | ICD-10-CM

## 2018-03-13 DIAGNOSIS — E119 Type 2 diabetes mellitus without complications: Secondary | ICD-10-CM

## 2018-03-13 NOTE — Telephone Encounter (Signed)
Copied from [place] [id number]. Topic: Quick Communication - Rx Refill/Question >> Mar 13, 2018  1:48 PM Rayann Heman wrote: Medication: metFORMIN (GLUCOPHAGE) 500 MG tablet [[id number]] omeprazole (PRILOSEC) 20 MG capsule [[id number]]   Pt is requesting call from dr Jeananne Rama   Has the patient contacted their pharmacy? No  Preferred Pharmacy (with phone number or street name): [place], [place] - [address] [phone] (Phone) [phone] (Fax)  Agent: Please be advised that RX refills may take up to 3 business days. We ask that you follow-up with your pharmacy.

## 2018-03-14 MED ORDER — OMEPRAZOLE 20 MG PO CPDR: 20.0000 mg | DELAYED_RELEASE_CAPSULE | Freq: Two times a day (BID) | ORAL | 1 refills | Status: DC

## 2018-03-14 MED ORDER — METFORMIN HCL 500 MG PO TABS: 1000.0000 mg | ORAL_TABLET | Freq: Two times a day (BID) | ORAL | 1 refills | Status: DC

## 2018-03-14 NOTE — Telephone Encounter (Signed)
Requested medication (s) are due for refill today: yes  Requested medication (s) are on the active medication list: yes  Last refill:  02/22/17  Future visit scheduled: yes  Notes to clinic:  Expired RX    Requested Prescriptions  Pending Prescriptions Disp Refills   FLUoxetine (PROZAC) 20 MG capsule [Pharmacy Med Name: FLUOXETINE HCL 20 MG CAP] 90 capsule     Sig: TAKE 1 CAPSULE BY MOUTH DAILY     Psychiatry:  Antidepressants - SSRI Passed - 03/13/2018  9:43 AM      Passed - Completed PHQ-2 or PHQ-9 in the last 360 days.      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

## 2018-03-15 ENCOUNTER — Other Ambulatory Visit: Payer: Self-pay | Admitting: Family Medicine

## 2018-03-15 DIAGNOSIS — F331 Major depressive disorder, recurrent, moderate: Secondary | ICD-10-CM

## 2018-03-16 NOTE — Telephone Encounter (Signed)
Already ordered on 03/15/18 in another encounter.

## 2018-03-21 ENCOUNTER — Ambulatory Visit (INDEPENDENT_AMBULATORY_CARE_PROVIDER_SITE_OTHER): Payer: Medicare Other | Admitting: Nurse Practitioner

## 2018-03-21 ENCOUNTER — Encounter: Payer: Self-pay | Admitting: Nurse Practitioner

## 2018-03-21 ENCOUNTER — Other Ambulatory Visit: Payer: Self-pay

## 2018-03-21 VITALS — BP 141/77 | HR 76 | Temp 98.3°F | Ht 67.0 in

## 2018-03-21 DIAGNOSIS — Z1211 Encounter for screening for malignant neoplasm of colon: Secondary | ICD-10-CM | POA: Diagnosis not present

## 2018-03-21 DIAGNOSIS — E1151 Type 2 diabetes mellitus with diabetic peripheral angiopathy without gangrene: Secondary | ICD-10-CM | POA: Diagnosis not present

## 2018-03-21 DIAGNOSIS — J411 Mucopurulent chronic bronchitis: Secondary | ICD-10-CM

## 2018-03-21 MED ORDER — ALBUTEROL SULFATE (2.5 MG/3ML) 0.083% IN NEBU
2.5000 mg | INHALATION_SOLUTION | Freq: Once | RESPIRATORY_TRACT | Status: AC
  Administered 2018-03-21: 2.5 mg via RESPIRATORY_TRACT

## 2018-03-21 MED ORDER — PREDNISONE 10 MG PO TABS: 30.0000 mg | ORAL_TABLET | Freq: Every day | ORAL | 0 refills | Status: AC

## 2018-03-21 MED ORDER — ALBUTEROL SULFATE HFA 108 (90 BASE) MCG/ACT IN AERS: 2.0000 | INHALATION_SPRAY | Freq: Four times a day (QID) | RESPIRATORY_TRACT | 0 refills | Status: DC | PRN

## 2018-03-21 MED ORDER — ALBUTEROL SULFATE (2.5 MG/3ML) 0.083% IN NEBU: 2.5000 mg | INHALATION_SOLUTION | Freq: Four times a day (QID) | RESPIRATORY_TRACT | 4 refills | Status: DC | PRN

## 2018-03-21 MED ORDER — AMOXICILLIN-POT CLAVULANATE 875-125 MG PO TABS: 1.0000 | ORAL_TABLET | Freq: Two times a day (BID) | ORAL | 0 refills | Status: DC

## 2018-03-21 MED ORDER — VARENICLINE TARTRATE 0.5 MG PO TABS: 0.5000 mg | ORAL_TABLET | Freq: Two times a day (BID) | ORAL | 3 refills | Status: DC

## 2018-03-21 NOTE — Assessment & Plan Note (Addendum)
Refer to vascular d/t pt report worsening pain and discoloration.  Referral sent to podiatry for foot care, calluses bilateral heels.  Continue current med regimen.  A1C today.

## 2018-03-21 NOTE — Progress Notes (Signed)
BP (!) 141/77   Pulse 76   Temp 98.3 F (36.8 C) (Oral)   Ht 5\' 7"  (1.702 m)   SpO2 95%   BMI 36.18 kg/m    Subjective:    Patient ID: Stacey Gross, female    DOB: 31-May-1960, 57 y.o.   MRN: [id number]  HPI: Stacey Gross is a 57 y.o. female presents for cough and bilateral foot pain  Chief Complaint  Patient presents with  . Cough    pt states she has had a chest congestion about a week ago  . Foot Pain    bilateral  . Wheezing   UPPER RESPIRATORY TRACT INFECTION Started a week ago.  Reports she has noted SOB with it walking car.  Has not taken inhalers in two weeks, as she ran out and could not afford to refill.  Has nebulizers at home, but has only been using occasionally.  Reports cough is getting worse, especially when she smokes.  Continues to smoke, but wishes to restart Chantix which she was on in 2017 and she reports it did help some.  She now reports she is more focused on quitting.  Discussed alternate options, such as OTC patch or lozenges.  She states they did not work. Worst symptom: cough Fever: no Cough: yes Shortness of breath: yes Wheezing: yes Chest pain: yes, with cough Chest tightness: yes Chest congestion: yes Nasal congestion: yes Runny nose: yes Post nasal drip: no Sneezing: no Sore throat: no Swollen glands: no Sinus pressure: no Headache: no Face pain: no Toothache: no Ear pain: none Ear pressure: none Eyes red/itching:no Eye drainage/crusting: no  Vomiting: no Rash: no Fatigue: yes Sick contacts: no Strep contacts: no  Context: worse Recurrent sinusitis: no Relief with OTC cold/cough medications: no  Treatments attempted: cold/sinus   BILATERAL FOOT PAIN: Has been ongoing issue for over a year.  Has previously seen vascular and reports being placed on Plavix, which has said they told her she could stop taking once pain improved.  Reports sharp pain to bilateral feet, can not tie shoes tight.  Has a burning sensation to feet,  reports when she takes her shoes off it is worse.  Worse pain at night or when on feet for long period of time.  States pain is present whether she is resting or moving.  Pain is a 10/10 when present and c/o callus to left heel, which causes discomfort.  States that she has had discoloration to bilateral toes that has waxed and waned over past year, which she states is now becoming worse.  Has not followed up with vascular in several months per her report, on review last saw vascular 04/2016, Dr. Lucky Cowboy.  Relevant past medical, surgical, family and social history reviewed and updated as indicated. Interim medical history since our last visit reviewed. Allergies and medications reviewed and updated.  Review of Systems  Constitutional: Positive for fatigue. Negative for activity change, appetite change, diaphoresis and fever.  HENT: Positive for congestion and rhinorrhea. Negative for ear discharge, ear pain, postnasal drip, sinus pressure, sinus pain, sneezing, sore throat and voice change.   Respiratory: Positive for cough, chest tightness (with cough), shortness of breath (with cough) and wheezing.   Cardiovascular: Negative for chest pain, palpitations and leg swelling.  Gastrointestinal: Negative for abdominal distention, abdominal pain, constipation, diarrhea, nausea and vomiting.  Endocrine: Negative for cold intolerance, heat intolerance, polydipsia, polyphagia and polyuria.  Skin: Positive for color change (bilateral toes).  Neurological: Negative for dizziness,  numbness and headaches.  Psychiatric/Behavioral: Negative.     Per HPI unless specifically indicated above     Objective:    BP (!) 141/77   Pulse 76   Temp 98.3 F (36.8 C) (Oral)   Ht 5\' 7"  (1.702 m)   SpO2 95%   BMI 36.18 kg/m   Wt Readings from Last 3 Encounters:  02/01/18 231 lb (104.8 kg)  02/01/18 231 lb (104.8 kg)  09/20/17 224 lb (101.6 kg)    Physical Exam Vitals signs and nursing note reviewed.    Constitutional:      Appearance: She is well-developed. She is obese. She is ill-appearing.  HENT:     Head: Normocephalic.     Right Ear: Hearing, ear canal and external ear normal. A middle ear effusion is present.     Left Ear: Hearing, ear canal and external ear normal. A middle ear effusion is present.     Nose: Mucosal edema and rhinorrhea present. Rhinorrhea is clear.     Right Sinus: No maxillary sinus tenderness or frontal sinus tenderness.     Left Sinus: No maxillary sinus tenderness or frontal sinus tenderness.     Mouth/Throat:     Mouth: Mucous membranes are moist.     Pharynx: Oropharynx is clear. No oropharyngeal exudate or posterior oropharyngeal erythema.  Eyes:     General:        Right eye: No discharge.        Left eye: No discharge.     Conjunctiva/sclera: Conjunctivae normal.     Pupils: Pupils are equal, round, and reactive to light.  Neck:     Musculoskeletal: Normal range of motion and neck supple.     Thyroid: No thyromegaly.     Vascular: No carotid bruit or JVD.  Cardiovascular:     Rate and Rhythm: Normal rate and regular rhythm.     Pulses:          Dorsalis pedis pulses are 1+ on the right side and 1+ on the left side.       Posterior tibial pulses are 1+ on the right side and 1+ on the left side.     Heart sounds: Normal heart sounds.     Comments: Diminished pulses bilateral feet.  Purplish discoloration to bilateral second and great toes (slow cap refill), with mild discoloration to remaining toes bilaterally.  Cool to touch.   Pulmonary:     Effort: Pulmonary effort is normal.     Breath sounds: Examination of the right-upper field reveals wheezing. Examination of the left-upper field reveals wheezing. Wheezing present.     Comments: Expiratory wheezes upper lobes with clear lower lobes. Audible wheezes intermittently while talking.  No SOB noted.  Intermittent productive (yellow) cough. Abdominal:     General: Bowel sounds are normal.      Palpations: Abdomen is soft.  Musculoskeletal:     Right lower leg: No edema.     Left lower leg: No edema.  Lymphadenopathy:     Cervical: No cervical adenopathy.  Skin:    General: Skin is warm and dry.  Neurological:     Mental Status: She is alert and oriented to person, place, and time.     Deep Tendon Reflexes: Reflexes are normal and symmetric.     Reflex Scores:      Brachioradialis reflexes are 2+ on the right side and 2+ on the left side.      Patellar reflexes are 2+ on the right  side and 2+ on the left side. Psychiatric:        Behavior: Behavior normal.        Thought Content: Thought content normal.        Judgment: Judgment normal.    Diabetic Foot Exam - Simple   Simple Foot Form Visual Inspection See comments:  Yes Sensation Testing See comments:  Yes Pulse Check See comments:  Yes Comments Refer to physical exam.  Decreased sensation bilateral feet L>R.  Calluses present bilateral heels.     Most Recent CrCl 92.48 based on most recent labs and weight.  Results for orders placed or performed during the hospital encounter of 09/20/17  Glucose, capillary  Result Value Ref Range   Glucose-Capillary 140 (H) 65 - 99 mg/dL  Surgical pathology  Result Value Ref Range   SURGICAL PATHOLOGY      Surgical Pathology CASE: [id number] PATIENT: [facility] Surgical Pathology Report     SPECIMEN SUBMITTED: A. Duodenum, thickened bulb fold; cbx B. Stomach, erosion, r/o h pylori; cbx C. Stomach, atrophic mucosa, 18 cm; cbx D. Colon polyp, ascending; cold snare E. Colon polyp, transverse; cbx F. Colon polyp, transverse; cold snare G. Colon polyp, sigmoid; hot snare H. Colon polyp x3, rectosigmoid; cold snare  CLINICAL HISTORY: None provided  PRE-OPERATIVE DIAGNOSIS: GERD K21.9; hx diverticulitis Z87.19, HX colon polyps Z86.010; hx melena K87.19  POST-OPERATIVE DIAGNOSIS: Gastric erosion, thickened duodenal fold, diverticulosis,  polyps     DIAGNOSIS: A.  THICKENED FOLD, DUODENUM BULB; COLD BIOPSY: - DUODENAL MUCOSA WITH PRESERVED VILLOUS ARCHITECTURE AND BRUNNER'S GLAND HYPERPLASIA. - NEGATIVE FOR INTRA-EPITHELIAL LYMPHOCYTOSIS, DYSPLASIA AND MALIGNANCY.  B.  STOMACH EROSION; COLD BIOPSY: - ANTRAL MUCOSA WITH MINIMAL CHRONIC GASTRITIS AND EROSI ON. - OXYNTIC MUCOSA WITH MINIMAL CHRONIC GASTRITIS. - NEGATIVE FOR H. PYLORI, DYSPLASIA, AND MALIGNANCY.  C.  ATROPHIC MUCOSA, STOMACH, 18 CM; COLD BIOPSY: - SUPERFICIAL SAMPLE OF OXYNTIC MUCOSA WITH MODERATE CHRONIC GASTRITIS WITH MILD ACTIVITY. - MILD FOVEOLAR HYPERPLASIA. - NEGATIVE FOR H. PYLORI, DYSPLASIA AND MALIGNANCY.  Note: Features of atrophy are not seen in this superficial sample.  D.  COLON POLYP, ASCENDING; COLD SNARE: - TUBULAR ADENOMA. - NEGATIVE FOR HIGH-GRADE DYSPLASIA AND MALIGNANCY.  E.  COLON POLYP, TRANSVERSE; COLD BIOPSY: - TUBULAR ADENOMA. - NEGATIVE FOR HIGH-GRADE DYSPLASIA AND MALIGNANCY.  F.  COLON POLYP, TRANSVERSE; COLD SNARE: - TUBULAR ADENOMA. - NEGATIVE FOR HIGH-GRADE DYSPLASIA AND MALIGNANCY.  G.  COLON POLYP, SIGMOID; HOT SNARE: - TUBULOVILLOUS ADENOMA. - NEGATIVE FOR HIGH-GRADE DYSPLASIA AND MALIGNANCY.  H.  COLON POLYP X3, RECTOSIGMOID; COLD SNARE: - HYPERPLASTIC POLYP (1), NEGATIVE FOR DYSPLASIA AND MALIGNANCY. - NO PA THOLOGIC CHANGE (2).   GROSS DESCRIPTION: A. Labeled: Cbx thickened duodenal bulb fold Received: In formalin Tissue fragment(s): 2 Size: 0.2 and 0.4 cm Description: Tan fragments Entirely submitted in 1 cassette.  B. Labeled: Cbx gastric erosion to rule out H. pylori Received: In formalin Tissue fragment(s): Multiple Size: aggregate, 1.0 x 0.4 x 0.1 cm Description: Pink-Tan fragments Entirely submitted in one cassette.  C. Labeled: Atopic gastric mucosa at 18 cm cbx Received in formalin Tissue fragment(s): 2 Size: 0.3 and 0.4 cm Description: Tan fragments Entirely submitted in one  cassette.  D. Labeled: Cold snare polyp ascending colon in formalin Received: In formalin Tissue fragment(s): 1 Size: 0.3 cm Description: Tan fragment Entirely submitted in one cassette.  E. Labeled: Cbx polyp transverse colon Received: In formalin Tissue fragment(s): Multiple Size: Aggregate, 0.8 x 0.2 x 0.1 cm Description: Tan fragments Entirely submitted in  one cassette .  F. Labeled: Cold snare transverse polyp: Received: In formalin Tissue fragment(s): Multiple Size: Aggregate, 1.2 x 0.3 x 0.1 cm Description: Tan fragments and fecal material Entirely submitted in one cassette.  G. Labeled: Hot snare polyp sigmoid colon Received: In formalin Tissue fragment(s): Multiple Size: Aggregate, 2.0 x 0.8 x 0.1 cm Description: Purple tissue fragments and fecal material, marked blue Entirely submitted in one cassette.  H. Labeled: Cold snare polyp x3 rectal sigmoid Received: In formalin Tissue fragment(s): Multiple Size: Aggregate, 2.0 x 1.0 x 0.1 cm Description: Tan fragments and fecal material Entirely submitted in one cassette.    Final Diagnosis performed by Delorse Lek, MD.   Electronically signed 09/21/2017 3:14:21PM Correction performed by Delorse Lek, MD.   Electronically signed 09/21/2017 3:15:06PM The electronic signature indicates that the named Attending Pathologist has evaluated the specimen  Technical component performe d at [facility], [address] [place], [place] [address] Lab: [phone] Dir: Rush Farmer, MD, MMM  Professional component performed at [facility], [facility], [place], [place],  [address] Lab: [phone] Dir: Dellia Nims. Rubinas, MD       Assessment & Plan:   Problem List Items Addressed This Visit      Cardiovascular and Mediastinum   Type 2 diabetes, controlled, with peripheral circulatory disorder ([place])    Refer to vascular d/t pt report worsening pain and discoloration.  Referral sent to podiatry for  foot care, calluses bilateral heels.  Continue current med regimen.  A1C today.      Relevant Orders   Ambulatory referral to Vascular Surgery   Ambulatory referral to Podiatry   HgB A1c     Respiratory   COPD (chronic obstructive pulmonary disease) ([place]) - Primary    Exacerbation present, most likely d/t continued smoking and no use of maintenance inhaler in weeks.  Script for Augmentin BID x 10 days, Albuterol inhaler and nebs, and Prednisone 30 MG x 5 days sent.  Provided sample of Symbicort to patient, as she reports financial strain with inhalers.  Chantix script sent (most recent CrCl 92.48).  Continue to encouraged complete cessation.  Return for worsening or continued symptoms.      Relevant Medications   albuterol (PROVENTIL) (2.5 MG/3ML) 0.083% nebulizer solution 2.5 mg (Completed)   albuterol (PROVENTIL) (2.5 MG/3ML) 0.083% nebulizer solution   varenicline (CHANTIX) 0.5 MG tablet   albuterol (PROVENTIL HFA;VENTOLIN HFA) 108 (90 Base) MCG/ACT inhaler   predniSONE (DELTASONE) 10 MG tablet    Other Visit Diagnoses    Encounter for screening colonoscopy       Referral to Dr. Allen Norris per patient request.   Relevant Orders   Ambulatory referral to Gastroenterology       Follow up plan: Return as scheduled in February Dr. Jeananne Rama.

## 2018-03-21 NOTE — Assessment & Plan Note (Signed)
Exacerbation present, most likely d/t continued smoking and no use of maintenance inhaler in weeks.  Script for Augmentin BID x 10 days, Albuterol inhaler and nebs, and Prednisone 30 MG x 5 days sent.  Provided sample of Symbicort to patient, as she reports financial strain with inhalers.  Chantix script sent (most recent CrCl 92.48).  Continue to encouraged complete cessation.  Return for worsening or continued symptoms.

## 2018-03-21 NOTE — Patient Instructions (Addendum)
Diabetes Mellitus and Foot Care  Foot care is an important part of your health, especially when you have diabetes. Diabetes may cause you to have problems because of poor blood flow (circulation) to your feet and legs, which can cause your skin to:   Become thinner and drier.   Break more easily.   Heal more slowly.   Peel and crack.  You may also have nerve damage (neuropathy) in your legs and feet, causing decreased feeling in them. This means that you may not notice minor injuries to your feet that could lead to more serious problems. Noticing and addressing any potential problems early is the best way to prevent future foot problems.  How to care for your feet  Foot hygiene   Wash your feet daily with warm water and mild soap. Do not use hot water. Then, pat your feet and the areas between your toes until they are completely dry. Do not soak your feet as this can dry your skin.   Trim your toenails straight across. Do not dig under them or around the cuticle. File the edges of your nails with an emery board or nail file.   Apply a moisturizing lotion or petroleum jelly to the skin on your feet and to dry, brittle toenails. Use lotion that does not contain alcohol and is unscented. Do not apply lotion between your toes.  Shoes and socks   Wear clean socks or stockings every day. Make sure they are not too tight. Do not wear knee-high stockings since they may decrease blood flow to your legs.   Wear shoes that fit properly and have enough cushioning. Always look in your shoes before you put them on to be sure there are no objects inside.   To break in new shoes, wear them for just a few hours a day. This prevents injuries on your feet.  Wounds, scrapes, corns, and calluses   Check your feet daily for blisters, cuts, bruises, sores, and redness. If you cannot see the bottom of your feet, use a mirror or ask someone for help.   Do not cut corns or calluses or try to remove them with medicine.   If you  find a minor scrape, cut, or break in the skin on your feet, keep it and the skin around it clean and dry. You may clean these areas with mild soap and water. Do not clean the area with peroxide, alcohol, or iodine.   If you have a wound, scrape, corn, or callus on your foot, look at it several times a day to make sure it is healing and not infected. Check for:  ? Redness, swelling, or pain.  ? Fluid or blood.  ? Warmth.  ? Pus or a bad smell.  General instructions   Do not cross your legs. This may decrease blood flow to your feet.   Do not use heating pads or hot water bottles on your feet. They may burn your skin. If you have lost feeling in your feet or legs, you may not know this is happening until it is too late.   Protect your feet from hot and cold by wearing shoes, such as at the beach or on hot pavement.   Schedule a complete foot exam at least once a year (annually) or more often if you have foot problems. If you have foot problems, report any cuts, sores, or bruises to your health care provider immediately.  Contact a health care provider if:     You have a medical condition that increases your risk of infection and you have any cuts, sores, or bruises on your feet.   You have an injury that is not healing.   You have redness on your legs or feet.   You feel burning or tingling in your legs or feet.   You have pain or cramps in your legs and feet.   Your legs or feet are numb.   Your feet always feel cold.   You have pain around a toenail.  Get help right away if:   You have a wound, scrape, corn, or callus on your foot and:  ? You have pain, swelling, or redness that gets worse.  ? You have fluid or blood coming from the wound, scrape, corn, or callus.  ? Your wound, scrape, corn, or callus feels warm to the touch.  ? You have pus or a bad smell coming from the wound, scrape, corn, or callus.  ? You have a fever.  ? You have a red line going up your leg.  Summary   Check your feet every day  for cuts, sores, red spots, swelling, and blisters.   Moisturize feet and legs daily.   Wear shoes that fit properly and have enough cushioning.   If you have foot problems, report any cuts, sores, or bruises to your health care provider immediately.   Schedule a complete foot exam at least once a year (annually) or more often if you have foot problems.  This information is not intended to replace advice given to you by your health care provider. Make sure you discuss any questions you have with your health care provider.  Document Released: 03/18/2000 Document Revised: 05/03/2017 Document Reviewed: 04/22/2016  [employer] Interactive Patient Education  2019 [employer]

## 2018-03-22 LAB — HEMOGLOBIN A1C
Est. average glucose Bld gHb Est-mCnc: 146 mg/dL
Hgb A1c MFr Bld: 6.7 % — ABNORMAL HIGH (ref 4.8–5.6)

## 2018-03-23 ENCOUNTER — Other Ambulatory Visit (INDEPENDENT_AMBULATORY_CARE_PROVIDER_SITE_OTHER): Payer: Self-pay | Admitting: Nurse Practitioner

## 2018-03-23 ENCOUNTER — Other Ambulatory Visit (INDEPENDENT_AMBULATORY_CARE_PROVIDER_SITE_OTHER): Payer: Medicare Other

## 2018-03-23 ENCOUNTER — Encounter (INDEPENDENT_AMBULATORY_CARE_PROVIDER_SITE_OTHER): Payer: Self-pay | Admitting: Vascular Surgery

## 2018-03-23 ENCOUNTER — Ambulatory Visit (INDEPENDENT_AMBULATORY_CARE_PROVIDER_SITE_OTHER): Payer: Medicare Other | Admitting: Vascular Surgery

## 2018-03-23 VITALS — BP 130/79 | HR 73 | Resp 16 | Ht 67.0 in | Wt 235.8 lb

## 2018-03-23 DIAGNOSIS — E119 Type 2 diabetes mellitus without complications: Secondary | ICD-10-CM

## 2018-03-23 DIAGNOSIS — M79661 Pain in right lower leg: Secondary | ICD-10-CM

## 2018-03-23 DIAGNOSIS — E782 Mixed hyperlipidemia: Secondary | ICD-10-CM | POA: Diagnosis not present

## 2018-03-23 DIAGNOSIS — M79662 Pain in left lower leg: Secondary | ICD-10-CM | POA: Diagnosis not present

## 2018-03-23 DIAGNOSIS — Z720 Tobacco use: Secondary | ICD-10-CM

## 2018-03-23 DIAGNOSIS — I75023 Atheroembolism of bilateral lower extremities: Secondary | ICD-10-CM

## 2018-03-23 DIAGNOSIS — F17210 Nicotine dependence, cigarettes, uncomplicated: Secondary | ICD-10-CM

## 2018-03-23 NOTE — Progress Notes (Signed)
MRN : [id number]  Stacey Gross is a 57 y.o. (11-Apr-1960) female who presents with chief complaint of  Chief Complaint  Patient presents with  . Follow-up  .  History of Present Illness: Patient returns today in follow up of PAD with some bluish discoloration to the toes on both feet.  She had the same symptoms about 2 years ago and underwent evaluation including an angiogram which showed only small vessel disease.  No intervention was performed, and her symptoms improved with Plavix.  She continues to smoke although she has gotten Chantix and is planning to stop the first of the year. We studied her with noninvasive studies today which showed normal triphasic waveforms and ABIs of 0.94 on the right and 0.92 on the left.  Her digital pressure was normal on the left and somewhat reduced on the right at 65.  Current Outpatient Medications  Medication Sig Dispense Refill  . albuterol (PROVENTIL HFA;VENTOLIN HFA) 108 (90 Base) MCG/ACT inhaler Inhale 2 puffs into the lungs every 6 (six) hours as needed for wheezing or shortness of breath. 1 Inhaler 0  . albuterol (PROVENTIL) (2.5 MG/3ML) 0.083% nebulizer solution Take 3 mLs (2.5 mg total) by nebulization every 6 (six) hours as needed for wheezing or shortness of breath. 150 mL 4  . amoxicillin-clavulanate (AUGMENTIN) 875-125 MG tablet Take 1 tablet by mouth 2 (two) times daily. 20 tablet 0  . atorvastatin (LIPITOR) 20 MG tablet Take 1 tablet (20 mg total) by mouth at bedtime. 90 tablet 4  . bisacodyl (DULCOLAX) 5 MG EC tablet Take 5 mg by mouth at bedtime.    . Blood Glucose Monitoring Suppl (ONE TOUCH ULTRA SYSTEM KIT) W/DEVICE KIT 1 kit by Does not apply route once. (Patient taking differently: 1 kit by Does not apply route daily. ) 100 each 12  . budesonide-formoterol (SYMBICORT) 160-4.5 MCG/ACT inhaler Inhale 2 puffs into the lungs 2 (two) times daily.    . cyclobenzaprine (FLEXERIL) 10 MG tablet TAKE ONE TABLET BY MOUTH AT BEDTIME. 30 tablet  3  . diclofenac (VOLTAREN) 75 MG EC tablet Take 1 tablet (75 mg total) by mouth 2 (two) times daily. 30 tablet 3  . FLUoxetine (PROZAC) 20 MG capsule TAKE 1 CAPSULE BY MOUTH DAILY 90 capsule 1  . glucose blood test strip Use as instructed 100 each 12  . linaclotide (LINZESS) 145 MCG CAPS capsule Take 1 capsule (145 mcg total) by mouth daily before breakfast. 30 capsule 5  . lisinopril (PRINIVIL,ZESTRIL) 10 MG tablet Take 1 tablet (10 mg total) by mouth daily. 90 tablet 4  . metFORMIN (GLUCOPHAGE) 500 MG tablet Take 2 tablets (1,000 mg total) by mouth 2 (two) times daily. 180 tablet 1  . omeprazole (PRILOSEC) 20 MG capsule Take 1 capsule (20 mg total) by mouth 2 (two) times daily before a meal. 180 capsule 1  . pioglitazone (ACTOS) 30 MG tablet Take 1 tablet (30 mg total) by mouth daily. 90 tablet 4  . predniSONE (DELTASONE) 10 MG tablet Take 3 tablets (30 mg total) by mouth daily with breakfast for 5 days. 15 tablet 0  . senna (SENOKOT) 8.6 MG TABS tablet Take 2 tablets by mouth daily.    . traZODone (DESYREL) 50 MG tablet TAKE ONE TABLET BY MOUTH AT BEDTIME AS NEEDED FOR SLEEP 30 tablet 5  . varenicline (CHANTIX) 0.5 MG tablet Take 1 tablet (0.5 mg total) by mouth 2 (two) times daily. Take one 0.5 MG tablet once a day for 3  days and then increase to one 0.5MG  tablet twice a day for 4 days and then increase to two 0.5 MG tablet twice a day. (Patient not taking: Reported on 03/23/2018) 90 tablet 3   No current facility-administered medications for this visit.     Past Medical History:  Diagnosis Date  . Asthma   . Cancer ([place])   . Chronic back pain   . Degenerative joint disease (DJD) of hip   . Diabetes mellitus without complication ([place])   . GERD (gastroesophageal reflux disease)    takes Omeprazole daily  . History of bronchitis yrs ago  . History of colon polyps    benign  . History of staph infection 10 yrs ago  . Hyperlipidemia    takes Atorvastatin daily  . Hypertension   .  Peripheral vascular disease ([place])   . Tobacco use    0ne pack of cigaretts daily  . Weakness    numbness and tingling in both legs     Past Surgical History:  Procedure Laterality Date  . ABDOMINAL HYSTERECTOMY     total  . APPENDECTOMY    . BACK SURGERY  9-10 yrs ago  . COLONOSCOPY    . COLONOSCOPY WITH PROPOFOL N/A 09/20/2017   Procedure: COLONOSCOPY WITH PROPOFOL;  Surgeon: Virgel Manifold, MD;  Location: [facility];  Service: Endoscopy;  Laterality: N/A;  . ESOPHAGOGASTRODUODENOSCOPY (EGD) WITH PROPOFOL N/A 09/20/2017   Procedure: ESOPHAGOGASTRODUODENOSCOPY (EGD) WITH PROPOFOL;  Surgeon: Virgel Manifold, MD;  Location: [facility] END[facility];  Service: Endoscopy;  Laterality: N/A;  . PERIPHERAL VASCULAR CATHETERIZATION Left 03/17/2016   Procedure: Lower Extremity Angiography;  Surgeon: Algernon Huxley, MD;  Location: [facility];  Service: Cardiovascular;  Laterality: Left;   Family History  Problem Relation Age of Onset  . Hyperlipidemia Mother   . Hyperlipidemia Father   . Cancer Sister     melanoma  No known history of bleeding disorders or clotting disorders.  Familiar with peripheral arterial disease as her husband has had a treated many times  Social History      Social History  Substance Use Topics  . Smoking status: Current Every Day Smoker    Packs/day: 0.50    Years: 43.00    Types: Cigarettes  . Smokeless tobacco: Never Used  . Alcohol use No  No IV drug use      Allergies  Allergen Reactions  . Doxycycline Hyclate Anaphylaxis, Swelling and Rash  . Latex Rash  . Vancomycin Rash      REVIEW OF SYSTEMS(Negative unless checked)  Constitutional: [] ?Weight loss[] ?Fever[] ?Chills Cardiac:[] ?Chest pain[] ?Chest pressure[] ?Palpitations [] ?Shortness of breath when laying flat [] ?Shortness of breath at rest [] ?Shortness of breath with exertion. Vascular: [x] ?Pain in legs with walking[] ?Pain in legsat  rest[] ?Pain in legs when laying flat [x] ?Claudication [] ?Pain in feet when walking [x] ?Pain in feet at rest [] ?Pain in feet when laying flat [] ?History of DVT [] ?Phlebitis [] ?Swelling in legs [] ?Varicose veins [] ?Non-healing ulcers Pulmonary: [] ?Uses home oxygen [] ?Productive cough[] ?Hemoptysis [] ?Wheeze [] ?COPD [] ?Asthma Neurologic: [] ?Dizziness [] ?Blackouts [] ?Seizures [] ?History of stroke [] ?History of TIA[] ?Aphasia [] ?Temporary blindness[] ?Dysphagia [] ?Weaknessor numbness in arms [] ?Weakness or numbnessin legs Musculoskeletal: [] ?Arthritis [] ?Joint swelling [] ?Joint pain [x] ?Low back pain Hematologic:[] ?Easy bruising[] ?Easy bleeding [] ?Hypercoagulable state [] ?Anemic [] ?Hepatitis Gastrointestinal:[] ?Blood in stool[] ?Vomiting blood[] ?Gastroesophageal reflux/heartburn[] ?Abdominal pain Genitourinary: [] ?Chronic kidney disease [] ?Difficulturination [] ?Frequenturination [] ?Burning with urination[] ?Hematuria Skin: [] ?Rashes [] ?Ulcers [] ?Wounds Psychological: [] ?History of anxiety[] ?History of major depression.    Physical Examination  BP 130/79 (BP Location: Right Arm, Patient Position: Sitting)   Pulse 73   Resp 16  Ht 5' 7"  (1.702 m)   Wt 235 lb 12.8 oz (107 kg)   BMI 36.93 kg/m  Gen:  WD/WN, NAD Head: [place]/AT, No temporalis wasting. Ear/Nose/Throat: Hearing grossly intact, nares w/o erythema or drainage Eyes: Conjunctiva clear. Sclera non-icteric Neck: Supple.  Trachea midline Pulmonary:  Good air movement, no use of accessory muscles.  Cardiac: RRR, no JVD Vascular:  Vessel Right Left  Radial Palpable Palpable                          PT Palpable Palpable  DP Palpable Palpable    Musculoskeletal: M/S 5/5 throughout.  No deformity or atrophy.  Purple to blueish discoloration of multiple toes on the right foot with the second toe being the worst.  The left second toe is also  discolored and somewhat blue.  No edema. Neurologic: Sensation grossly intact in extremities.  Symmetrical.  Speech is fluent.  Psychiatric: Judgment intact, Mood & affect appropriate for pt's clinical situation. Dermatologic: No rashes or ulcers noted.  No cellulitis or open wounds.       Labs Recent Results (from the past 2160 hour(s))  HgB A1c     Status: Abnormal   Collection Time: 03/21/18 11:48 AM  Result Value Ref Range   Hgb A1c MFr Bld 6.7 (H) 4.8 - 5.6 %    Comment:          Prediabetes: 5.7 - 6.4          Diabetes: >6.4          Glycemic control for adults with diabetes: <7.0    Est. average glucose Bld gHb Est-mCnc 146 mg/dL    Radiology No results found.  Assessment/Plan Hyperlipidemia lipid control important in reducing the progression of atherosclerotic disease. Continue statin therapy   Diabetes blood glucose control important in reducing the progression of atherosclerotic disease. Also, involved in wound healing. On appropriate medications.  Tobacco abuse We had a discussion for approximately 3 minutes regarding the absolute need for smoking cessation due to the deleterious nature of tobacco on the vascular system. We discussed the tobacco use would diminish patency of any intervention, and likely significantly worsen progressio of disease. We discussed multiple agents for quitting including replacement therapy or medications to reduce cravings such as Chantix. The patient voices their understanding of the importance of smoking cessation.   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  Her digital pressure was normal on the left and somewhat reduced on the right at 65. Given these findings, it would appear that she has small vessel disease as well as atheroembolization/blue toe syndrome.  Given her relatively normal ABIs, the likelihood that  endovascular intervention is going to be of benefit is somewhat limited.  At this point I would resume her Plavix that she had been on a couple of years ago.  She will continue her statin agent.  I will reassess her in 1 to 2 months and if her symptoms worsen or do not improve, an angiogram may need to be performed for complete evaluation.  Smoking cessation would be of great benefit.    Leotis Pain, MD  03/23/2018 1:16 PM    This note was created with Dragon medical transcription system.  Any errors from dictation are purely unintentional

## 2018-03-23 NOTE — Patient Instructions (Signed)
Peripheral Vascular Disease  Peripheral vascular disease (PVD) is a disease of the blood vessels that are not part of your heart and brain. A simple term for PVD is poor circulation. In most cases, PVD narrows the blood vessels that carry blood from your heart to the rest of your body. This can reduce the supply of blood to your arms, legs, and internal organs, like your stomach or kidneys. However, PVD most often affects a person's lower legs and feet. Without treatment, PVD tends to get worse. PVD can also lead to acute ischemic limb. This is when an arm or leg suddenly cannot get enough blood. This is a medical emergency. Follow these instructions at home: Lifestyle  Do not use any products that contain nicotine or tobacco, such as cigarettes and e-cigarettes. If you need help quitting, ask your doctor.  Lose weight if you are overweight. Or, stay at a healthy weight as told by your doctor.  Eat a diet that is low in fat and cholesterol. If you need help, ask your doctor.  Exercise regularly. Ask your doctor for activities that are right for you. General instructions  Take over-the-counter and prescription medicines only as told by your doctor.  Take good care of your feet: ? Wear comfortable shoes that fit well. ? Check your feet often for any cuts or sores.  Keep all follow-up visits as told by your doctor This is important. Contact a doctor if:  You have cramps in your legs when you walk.  You have leg pain when you are at rest.  You have coldness in a leg or foot.  Your skin changes.  You are unable to get or have an erection (erectile dysfunction).  You have cuts or sores on your feet that do not heal. Get help right away if:  Your arm or leg turns cold, numb, and blue.  Your arms or legs become red, warm, swollen, painful, or numb.  You have chest pain.  You have trouble breathing.  You suddenly have weakness in your face, arm, or leg.  You become very  confused or you cannot speak.  You suddenly have a very bad headache.  You suddenly cannot see. Summary  Peripheral vascular disease (PVD) is a disease of the blood vessels.  A simple term for PVD is poor circulation. Without treatment, PVD tends to get worse.  Treatment may include exercise, low fat and low cholesterol diet, and quitting smoking. This information is not intended to replace advice given to you by your health care provider. Make sure you discuss any questions you have with your health care provider. Document Released: 06/15/2009 Document Revised: 04/28/2016 Document Reviewed: 04/28/2016 Elsevier Interactive Patient Education  2019 Elsevier Inc.  

## 2018-03-23 NOTE — Assessment & Plan Note (Signed)

## 2018-03-23 NOTE — Assessment & Plan Note (Signed)
We studied her with noninvasive studies today which showed normal triphasic waveforms and ABIs of 0.94 on the right and 0.92 on the left.  Her digital pressure was normal on the left and somewhat reduced on the right at 65. Given these findings, it would appear that she has small vessel disease as well as atheroembolization/blue toe syndrome.  Given her relatively normal ABIs, the likelihood that endovascular intervention is going to be of benefit is somewhat limited.  At this point I would resume her Plavix that she had been on a couple of years ago.  She will continue her statin agent.  I will reassess her in 1 to 2 months and if her symptoms worsen or do not improve, an angiogram may need to be performed for complete evaluation.  Smoking cessation would be of great benefit.

## 2018-03-26 ENCOUNTER — Other Ambulatory Visit: Payer: Self-pay | Admitting: Nurse Practitioner

## 2018-03-26 ENCOUNTER — Telehealth: Payer: Self-pay | Admitting: Nurse Practitioner

## 2018-03-26 ENCOUNTER — Ambulatory Visit
Admission: RE | Admit: 2018-03-26 | Discharge: 2018-03-26 | Disposition: A | Discharge status: Home / Self Care | Payer: Medicare Other | Source: Ambulatory Visit | Attending: Family Medicine | Admitting: Family Medicine

## 2018-03-26 DIAGNOSIS — Z1239 Encounter for other screening for malignant neoplasm of breast: Secondary | ICD-10-CM

## 2018-03-26 DIAGNOSIS — Z1231 Encounter for screening mammogram for malignant neoplasm of breast: Secondary | ICD-10-CM | POA: Diagnosis not present

## 2018-03-26 MED ORDER — VARENICLINE TARTRATE 1 MG PO TABS: 1.0000 mg | ORAL_TABLET | Freq: Two times a day (BID) | ORAL | 2 refills | Status: DC

## 2018-03-26 MED ORDER — VARENICLINE TARTRATE 0.5 MG PO TABS: ORAL_TABLET | ORAL | 0 refills | Status: DC

## 2018-03-26 NOTE — Telephone Encounter (Signed)
Copied from [place] [id number]. Topic: Quick Communication - Rx Refill/Question >> Mar 26, 2018 11:55 AM Margot Ables wrote: Medication: varenicline (CHANTIX) 0.5 MG tablet - need to resend RX for chantix starter pack & then separate RX for the continuing therapy.  Starter pack - Take 1 tablet (0.5 mg total) by mouth 2 (two) times daily. Take one 0.5 MG tablet once a day for 3 days and then increase to one 0.5MG   tablet twice a day for 4 days and then increase to two 0.5 MG tablet twice a day.  Has the patient contacted their pharmacy? Yes Preferred Pharmacy (with phone number or street name): [facility] [address] [place], [place] - [place] [phone] (Phone) [phone] (Fax)

## 2018-03-26 NOTE — Telephone Encounter (Signed)
Complete

## 2018-03-26 NOTE — Progress Notes (Signed)
Dose change and new script sent for Chantix.

## 2018-03-29 ENCOUNTER — Telehealth: Payer: Self-pay

## 2018-03-29 ENCOUNTER — Other Ambulatory Visit: Payer: Self-pay

## 2018-03-29 DIAGNOSIS — Z86010 Personal history of colonic polyps: Secondary | ICD-10-CM

## 2018-03-29 DIAGNOSIS — E7800 Pure hypercholesterolemia, unspecified: Secondary | ICD-10-CM

## 2018-03-29 MED ORDER — ATORVASTATIN CALCIUM 20 MG PO TABS: 20.0000 mg | ORAL_TABLET | Freq: Every day | ORAL | 4 refills | Status: DC

## 2018-03-29 NOTE — Telephone Encounter (Signed)
Contacted patient to discuss new referral for a colonoscopy.  Upon reviewing her chart I noticed that she had been seen by Dr. Bonna Gains.  Paitent stated that she did not want to see Dr. Bonna Gains and she was supposed to be scheduled originally with Dr. Allen Norris from the start and did not want to see Dr. Bonna Gains to start with.    I discussed this matter with Ginger (Lead CMA) and she approved of patient transferring service from Dr. Bonna Gains to Dr. Allen Norris.  Patient has been scheduled colonoscopy with dx of history of colon polyps Allen Norris) as she mentioned during her triage.  She also wanted an appt with Dr. Allen Norris to discuss her change in bowel habits.  I will ask front office to call and arrange an office visit with Dr. Allen Norris to be scheduled after her colonoscopy date.  Thanks [employer]

## 2018-03-29 NOTE — Telephone Encounter (Signed)
-----   Message from Algernon Huxley, MD sent at 03/29/2018 10:29 AM EST ----- She can stop five days before the procedure and resume the day after the procedure ----- Message ----- From: Vanetta Mulders, CMA Sent: 03/29/2018   9:58 AM EST To: Algernon Huxley, MD

## 2018-03-29 NOTE — Telephone Encounter (Signed)
Contacted patient to notify her that we are able to transfer her care from Dr. Bonna Gains to Dr. Allen Norris.  Her office visit has been scheduled for Dr. Allen Norris on 02/18 with Dr. Allen Norris at [facility] location.  Thanks [employer]

## 2018-03-29 NOTE — Telephone Encounter (Signed)
LVM to advise patient to stop blood thinner 5 days prior to colonoscopy and resume the day after as advised by Dr. Lucky Cowboy.  Thanks [employer]

## 2018-04-13 ENCOUNTER — Ambulatory Visit: Payer: Medicare Other | Admitting: Podiatry

## 2018-04-13 ENCOUNTER — Encounter: Payer: Self-pay | Admitting: Podiatry

## 2018-04-13 ENCOUNTER — Ambulatory Visit (INDEPENDENT_AMBULATORY_CARE_PROVIDER_SITE_OTHER): Payer: Medicare Other

## 2018-04-13 VITALS — BP 124/73 | HR 72 | Temp 97.5°F

## 2018-04-13 DIAGNOSIS — L989 Disorder of the skin and subcutaneous tissue, unspecified: Secondary | ICD-10-CM

## 2018-04-13 DIAGNOSIS — M722 Plantar fascial fibromatosis: Secondary | ICD-10-CM

## 2018-04-13 DIAGNOSIS — I739 Peripheral vascular disease, unspecified: Secondary | ICD-10-CM | POA: Diagnosis not present

## 2018-04-16 NOTE — Progress Notes (Signed)
   Subjective: 58 year old female presenting today with a chief complaint of constant sharp, stabbing pain to the plantar left heel secondary to a callus lesion that has been present for the past 3-4 months. Walking increases the pain. She has not done anything for treatment. She states she has circulation issues and is treated by Dr. Lucky Cowboy. Patient is here for further evaluation and treatment.   Past Medical History:  Diagnosis Date  . Asthma   . Cancer ([place])    stomach  . Chronic back pain   . Degenerative joint disease (DJD) of hip   . Diabetes mellitus without complication ([place])   . GERD (gastroesophageal reflux disease)    takes Omeprazole daily  . History of bronchitis yrs ago  . History of colon polyps    benign  . History of staph infection 10 yrs ago  . Hyperlipidemia    takes Atorvastatin daily  . Hypertension   . Peripheral vascular disease ([place])   . Tobacco use    0ne pack of cigaretts daily  . Weakness    numbness and tingling in both legs      Objective:  Physical Exam General: Alert and oriented x3 in no acute distress  Dermatology: Hyperkeratotic lesion present on the left heel. Pain on palpation with a central nucleated core noted.  Skin is warm, dry and supple bilateral lower extremities. Negative for open lesions or macerations.  Vascular: Palpable pedal pulses bilaterally. No edema or erythema noted. Capillary refill within normal limits.  Neurological: Epicritic and protective threshold diminished bilaterally.   Musculoskeletal Exam: Pain on palpation at the keratotic lesion noted. Range of motion within normal limits bilateral. Muscle strength 5/5 in all groups bilateral.  Radiographic Exam:  Normal osseous mineralization. Joint spaces preserved. No fracture/dislocation/boney destruction.    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 X-Rays  reviewed.  #2 Excisional debridement of keratotic lesion using a chisel blade was performed without incident.  #3 Dressed area with light dressing. #4 Continue PVD management with Dr. Lucky Cowboy, Vascular.  #5 Recommended good shoe gear.  #6 Patient is to return to the clinic PRN.    Edrick Kins, DPM [facility]  Dr. Edrick Kins, [address]                                        [place], [place] [address]                Office [phone]  Fax [phone]

## 2018-04-17 ENCOUNTER — Other Ambulatory Visit: Payer: Self-pay | Admitting: Family Medicine

## 2018-04-17 DIAGNOSIS — I10 Essential (primary) hypertension: Secondary | ICD-10-CM

## 2018-04-17 DIAGNOSIS — E119 Type 2 diabetes mellitus without complications: Secondary | ICD-10-CM

## 2018-04-24 ENCOUNTER — Ambulatory Visit: Payer: Medicare Other | Admitting: Certified Registered Nurse Anesthetist

## 2018-04-24 ENCOUNTER — Encounter: Payer: Self-pay | Admitting: *Deleted

## 2018-04-24 ENCOUNTER — Ambulatory Visit
Admission: RE | Admit: 2018-04-24 | Discharge: 2018-04-24 | Disposition: A | Discharge status: Home / Self Care | Payer: Medicare Other | Attending: Gastroenterology | Admitting: Gastroenterology

## 2018-04-24 ENCOUNTER — Encounter
Admission: RE | Disposition: A | Discharge status: Home / Self Care | Payer: Self-pay | Source: Home / Self Care | Attending: Gastroenterology

## 2018-04-24 DIAGNOSIS — Z881 Allergy status to other antibiotic agents status: Secondary | ICD-10-CM | POA: Diagnosis not present

## 2018-04-24 DIAGNOSIS — Z79899 Other long term (current) drug therapy: Secondary | ICD-10-CM | POA: Diagnosis not present

## 2018-04-24 DIAGNOSIS — E1151 Type 2 diabetes mellitus with diabetic peripheral angiopathy without gangrene: Secondary | ICD-10-CM | POA: Insufficient documentation

## 2018-04-24 DIAGNOSIS — K641 Second degree hemorrhoids: Secondary | ICD-10-CM | POA: Diagnosis not present

## 2018-04-24 DIAGNOSIS — K219 Gastro-esophageal reflux disease without esophagitis: Secondary | ICD-10-CM | POA: Diagnosis not present

## 2018-04-24 DIAGNOSIS — Z86010 Personal history of colonic polyps: Secondary | ICD-10-CM | POA: Diagnosis not present

## 2018-04-24 DIAGNOSIS — K5730 Diverticulosis of large intestine without perforation or abscess without bleeding: Secondary | ICD-10-CM | POA: Insufficient documentation

## 2018-04-24 DIAGNOSIS — Z1211 Encounter for screening for malignant neoplasm of colon: Secondary | ICD-10-CM | POA: Insufficient documentation

## 2018-04-24 DIAGNOSIS — Z7951 Long term (current) use of inhaled steroids: Secondary | ICD-10-CM | POA: Insufficient documentation

## 2018-04-24 DIAGNOSIS — D122 Benign neoplasm of ascending colon: Secondary | ICD-10-CM | POA: Diagnosis not present

## 2018-04-24 DIAGNOSIS — F17210 Nicotine dependence, cigarettes, uncomplicated: Secondary | ICD-10-CM | POA: Insufficient documentation

## 2018-04-24 DIAGNOSIS — D124 Benign neoplasm of descending colon: Secondary | ICD-10-CM | POA: Diagnosis not present

## 2018-04-24 DIAGNOSIS — K5790 Diverticulosis of intestine, part unspecified, without perforation or abscess without bleeding: Secondary | ICD-10-CM | POA: Diagnosis not present

## 2018-04-24 DIAGNOSIS — Z7984 Long term (current) use of oral hypoglycemic drugs: Secondary | ICD-10-CM | POA: Insufficient documentation

## 2018-04-24 DIAGNOSIS — D126 Benign neoplasm of colon, unspecified: Secondary | ICD-10-CM | POA: Diagnosis not present

## 2018-04-24 DIAGNOSIS — D123 Benign neoplasm of transverse colon: Secondary | ICD-10-CM | POA: Diagnosis not present

## 2018-04-24 DIAGNOSIS — E785 Hyperlipidemia, unspecified: Secondary | ICD-10-CM | POA: Diagnosis not present

## 2018-04-24 DIAGNOSIS — F329 Major depressive disorder, single episode, unspecified: Secondary | ICD-10-CM | POA: Diagnosis not present

## 2018-04-24 DIAGNOSIS — J449 Chronic obstructive pulmonary disease, unspecified: Secondary | ICD-10-CM | POA: Insufficient documentation

## 2018-04-24 DIAGNOSIS — K635 Polyp of colon: Secondary | ICD-10-CM | POA: Insufficient documentation

## 2018-04-24 DIAGNOSIS — I10 Essential (primary) hypertension: Secondary | ICD-10-CM | POA: Insufficient documentation

## 2018-04-24 HISTORY — DX: Chronic obstructive pulmonary disease, unspecified: J44.9

## 2018-04-24 HISTORY — DX: Dyspnea, unspecified: R06.00

## 2018-04-24 HISTORY — PX: COLONOSCOPY WITH PROPOFOL: SHX5780

## 2018-04-24 LAB — GLUCOSE, CAPILLARY: Glucose-Capillary: 113 mg/dL — ABNORMAL HIGH (ref 70–99)

## 2018-04-24 SURGERY — COLONOSCOPY WITH PROPOFOL
Anesthesia: General

## 2018-04-24 MED ORDER — LIDOCAINE HCL (CARDIAC) PF 100 MG/5ML IV SOSY
PREFILLED_SYRINGE | INTRAVENOUS | Status: DC | PRN
  Administered 2018-04-24: 50 mg via INTRAVENOUS

## 2018-04-24 MED ORDER — PROPOFOL 500 MG/50ML IV EMUL
INTRAVENOUS | Status: AC
  Filled 2018-04-24: qty 50

## 2018-04-24 MED ORDER — LIDOCAINE HCL (PF) 2 % IJ SOLN
INTRAMUSCULAR | Status: AC
  Filled 2018-04-24: qty 10

## 2018-04-24 MED ORDER — SODIUM CHLORIDE 0.9 % IV SOLN
INTRAVENOUS | Status: DC
  Administered 2018-04-24: 1000 mL via INTRAVENOUS

## 2018-04-24 MED ORDER — PROPOFOL 500 MG/50ML IV EMUL
INTRAVENOUS | Status: DC | PRN
  Administered 2018-04-24: 125 ug/kg/min via INTRAVENOUS

## 2018-04-24 MED ORDER — PROPOFOL 10 MG/ML IV BOLUS
INTRAVENOUS | Status: DC | PRN
  Administered 2018-04-24: 20 mg via INTRAVENOUS
  Administered 2018-04-24: 80 mg via INTRAVENOUS
  Administered 2018-04-24: 20 mg via INTRAVENOUS

## 2018-04-24 NOTE — H&P (Signed)
Lucilla Lame, MD [address] [place], [place] [address] Phone:[phone] Fax : [phone]  Primary Care Physician:  Guadalupe Maple, MD Primary Gastroenterologist:  Dr. Allen Norris  Pre-Procedure History & Physical: HPI:  Stacey Gross is a 58 y.o. female is here for an colonoscopy.   Past Medical History:  Diagnosis Date  . Asthma   . Cancer ([place])    stomach  . Chronic back pain   . COPD (chronic obstructive pulmonary disease) ([place])   . Degenerative joint disease (DJD) of hip   . Diabetes mellitus without complication ([place])   . Dyspnea   . GERD (gastroesophageal reflux disease)    takes Omeprazole daily  . History of bronchitis yrs ago  . History of colon polyps    benign  . History of staph infection 10 yrs ago  . Hyperlipidemia    takes Atorvastatin daily  . Hypertension   . Peripheral vascular disease ([place])   . Tobacco use    0ne pack of cigaretts daily  . Weakness    numbness and tingling in both legs     Past Surgical History:  Procedure Laterality Date  . ABDOMINAL HYSTERECTOMY     total  . APPENDECTOMY    . BACK SURGERY  9-10 yrs ago  . BREAST BIOPSY Right    Core- neg  . COLONOSCOPY    . COLONOSCOPY WITH PROPOFOL N/A 09/20/2017   Procedure: COLONOSCOPY WITH PROPOFOL;  Surgeon: Virgel Manifold, MD;  Location: [facility] ENDOSCOPY;  Service: Endoscopy;  Laterality: N/A;  . ESOPHAGOGASTRODUODENOSCOPY (EGD) WITH PROPOFOL N/A 09/20/2017   Procedure: ESOPHAGOGASTRODUODENOSCOPY (EGD) WITH PROPOFOL;  Surgeon: Virgel Manifold, MD;  Location: [facility] ENDOSCOPY;  Service: Endoscopy;  Laterality: N/A;  . PERIPHERAL VASCULAR CATHETERIZATION Left 03/17/2016   Procedure: Lower Extremity Angiography;  Surgeon: Algernon Huxley, MD;  Location: [facility];  Service: Cardiovascular;  Laterality: Left;    Prior to Admission medications   Medication Sig Start Date End Date Taking? Authorizing Provider  budesonide-formoterol (SYMBICORT) 160-4.5  MCG/ACT inhaler Inhale 2 puffs into the lungs 2 (two) times daily.   Yes [provider]  albuterol (PROVENTIL HFA;VENTOLIN HFA) 108 (90 Base) MCG/ACT inhaler Inhale 2 puffs into the lungs every 6 (six) hours as needed for wheezing or shortness of breath. 03/21/18   Cannady, Henrine Screws T, NP  albuterol (PROVENTIL) (2.5 MG/3ML) 0.083% nebulizer solution Take 3 mLs (2.5 mg total) by nebulization every 6 (six) hours as needed for wheezing or shortness of breath. 03/21/18   Cannady, Henrine Screws T, NP  atorvastatin (LIPITOR) 20 MG tablet Take 1 tablet (20 mg total) by mouth at bedtime. 03/29/18   Cannady, Henrine Screws T, NP  bisacodyl (DULCOLAX) 5 MG EC tablet Take 5 mg by mouth at bedtime.    [provider]  Blood Glucose Monitoring Suppl (ONE TOUCH ULTRA SYSTEM KIT) W/DEVICE KIT 1 kit by Does not apply route once. Patient taking differently: 1 kit by Does not apply route daily.  02/03/15   Guadalupe Maple, MD  cyclobenzaprine (FLEXERIL) 10 MG tablet TAKE ONE TABLET BY MOUTH AT BEDTIME. 10/04/17   Crissman, Jeannette How, MD  diclofenac (VOLTAREN) 75 MG EC tablet Take 1 tablet (75 mg total) by mouth 2 (two) times daily. 02/01/18   Guadalupe Maple, MD  FLUoxetine (PROZAC) 20 MG capsule TAKE 1 CAPSULE BY MOUTH DAILY 03/15/18   Guadalupe Maple, MD  glucose blood test strip Use as instructed 09/13/16   Guadalupe Maple,  MD  linaclotide (LINZESS) 145 MCG CAPS capsule Take 1 capsule (145 mcg total) by mouth daily before breakfast. 10/23/17   Guadalupe Maple, MD  lisinopril (PRINIVIL,ZESTRIL) 10 MG tablet TAKE 1 TABLET BY MOUTH EVERY DAY 04/17/18   Guadalupe Maple, MD  metFORMIN (GLUCOPHAGE) 500 MG tablet Take 2 tablets (1,000 mg total) by mouth 2 (two) times daily. 03/14/18   Guadalupe Maple, MD  omeprazole (PRILOSEC) 20 MG capsule Take 1 capsule (20 mg total) by mouth 2 (two) times daily before a meal. 03/14/18   Crissman, Jeannette How, MD  pioglitazone (ACTOS) 30 MG tablet TAKE 1 TABLET BY MOUTH EVERY DAY 04/17/18    Guadalupe Maple, MD  senna (SENOKOT) 8.6 MG TABS tablet Take 2 tablets by mouth daily.    [provider]  traZODone (DESYREL) 50 MG tablet TAKE ONE TABLET BY MOUTH AT BEDTIME AS NEEDED FOR SLEEP 09/14/17   Guadalupe Maple, MD  varenicline (CHANTIX CONTINUING MONTH PAK) 1 MG tablet Take 1 tablet (1 mg total) by mouth 2 (two) times daily. Start this only after finished initial 7 day start up. 04/04/17   Cannady, Henrine Screws T, NP  varenicline (CHANTIX) 0.5 MG tablet Take 1 tablet (0.5MG) once a day x 3 days and then 1 tablet twice a day x 4 days. 03/26/18   Marnee Guarneri T, NP    Allergies as of 03/30/2018 - Review Complete 03/23/2018  Allergen Reaction Noted  . Doxycycline hyclate Anaphylaxis, Swelling, and Rash   . Latex Rash 09/18/2013  . Vancomycin Rash     Family History  Problem Relation Age of Onset  . Hyperlipidemia Mother   . Hyperlipidemia Father   . Melanoma Sister   . Breast cancer Neg Hx     Social History   Socioeconomic History  . Marital status: Widowed    Spouse name: Not on file  . Number of children: 0  . Years of education: 8  . Highest education level: Not on file  Occupational History  . Occupation: disabled  Social Needs  . Financial resource strain: Not very hard  . Food insecurity:    Worry: Never true    Inability: Never true  . Transportation needs:    Medical: No    Non-medical: No  Tobacco Use  . Smoking status: Current Every Day Smoker    Packs/day: 0.50    Years: 43.00    Pack years: 21.50    Types: Cigarettes  . Smokeless tobacco: Never Used  Substance and Sexual Activity  . Alcohol use: No  . Drug use: No  . Sexual activity: Not on file  Lifestyle  . Physical activity:    Days per week: 0 days    Minutes per session: 0 min  . Stress: Patient refused  Relationships  . Social connections:    Talks on phone: More than three times a week    Gets together: More than three times a week    Attends religious service: Never     Active member of club or organization: No    Attends meetings of clubs or organizations: Never    Relationship status: Widowed  . Intimate partner violence:    Fear of current or ex partner: No    Emotionally abused: No    Physically abused: No    Forced sexual activity: No  Other Topics Concern  . Not on file  Social History Narrative  . Not on file    Review of Systems: See  HPI, otherwise negative ROS  Physical Exam: BP (!) 130/93   Pulse 76   Temp (!) 97.2 F (36.2 C) (Tympanic)   Resp 20   Ht 5' 7"  (1.702 m)   Wt 104.3 kg   SpO2 97%   BMI 36.02 kg/m  General:   Alert,  pleasant and cooperative in NAD Head:  Normocephalic and atraumatic. Neck:  Supple; no masses or thyromegaly. Lungs:  Clear throughout to auscultation.    Heart:  Regular rate and rhythm. Abdomen:  Soft, nontender and nondistended. Normal bowel sounds, without guarding, and without rebound.   Neurologic:  Alert and  oriented x4;  grossly normal neurologically.  Impression/Plan: Stacey Gross is here for an colonoscopy to be performed for history of polyps 09/20/2017 with sub optimal prep.  Risks, benefits, limitations, and alternatives regarding  colonoscopy have been reviewed with the patient.  Questions have been answered.  All parties agreeable.   Lucilla Lame, MD  04/24/2018, 8:10 AM

## 2018-04-24 NOTE — Transfer of Care (Signed)
Immediate Anesthesia Transfer of Care Note  Patient: Stacey Gross  Procedure(s) Performed: COLONOSCOPY WITH PROPOFOL (N/A )  Patient Location: PACU and Endoscopy Unit  Anesthesia Type:General  Level of Consciousness: awake, alert , oriented and patient cooperative  Airway & Oxygen Therapy: Patient Spontanous Breathing  Post-op Assessment: Report given to RN and Post -op Vital signs reviewed and stable  Post vital signs: Reviewed and stable  Last Vitals:  Vitals Value Taken Time  BP 109/66 04/24/2018  8:33 AM  Temp 36.2 C 04/24/2018  8:32 AM  Pulse 77 04/24/2018  8:33 AM  Resp 20 04/24/2018  8:33 AM  SpO2 98 % 04/24/2018  8:33 AM  Vitals shown include unvalidated device data.  Last Pain:  Vitals:   04/24/18 0832  TempSrc: Tympanic  PainSc: 0-No pain         Complications: No apparent anesthesia complications

## 2018-04-24 NOTE — Anesthesia Postprocedure Evaluation (Signed)
Anesthesia Post Note  Patient: Stacey Gross  Procedure(s) Performed: COLONOSCOPY WITH PROPOFOL (N/A )  Patient location during evaluation: Endoscopy Anesthesia Type: General Level of consciousness: awake and alert Pain management: pain level controlled Vital Signs Assessment: post-procedure vital signs reviewed and stable Respiratory status: spontaneous breathing, nonlabored ventilation, respiratory function stable and patient connected to nasal cannula oxygen Cardiovascular status: blood pressure returned to baseline and stable Postop Assessment: no apparent nausea or vomiting Anesthetic complications: no     Last Vitals:  Vitals:   04/24/18 0852 04/24/18 0902  BP: (!) 136/57 (!) 132/117  Pulse: 63 (!) 56  Resp: 17 (!) 22  Temp:    SpO2: 100% 94%    Last Pain:  Vitals:   04/24/18 0902  TempSrc:   PainSc: 0-No pain                 Precious Haws Rayburn Mundis

## 2018-04-24 NOTE — Op Note (Signed)
[facility] Gastroenterology Patient Name: Stacey Gross Procedure Date: 04/24/2018 7:59 AM MRN: [id number] Account #: [id number] Date of Birth: Feb 13, 1961 Admit Type: Outpatient Age: 58 Room: [facility] ENDO ROOM 4 Gender: Female Note Status: Finalized Procedure:            Colonoscopy Indications:          High risk colon cancer surveillance: Personal history                        of colonic polyps Providers:            Lucilla Lame MD, MD Medicines:            Propofol per Anesthesia Complications:        No immediate complications. Procedure:            Pre-Anesthesia Assessment:                       - Prior to the procedure, a History and Physical was                        performed, and patient medications and allergies were                        reviewed. The patient's tolerance of previous                        anesthesia was also reviewed. The risks and benefits of                        the procedure and the sedation options and risks were                        discussed with the patient. All questions were                        answered, and informed consent was obtained. Prior                        Anticoagulants: The patient has taken no previous                        anticoagulant or antiplatelet agents. ASA Grade                        Assessment: II - A patient with mild systemic disease.                        After reviewing the risks and benefits, the patient was                        deemed in satisfactory condition to undergo the                        procedure.                       After obtaining informed consent, the colonoscope was                        passed under direct vision. Throughout the  procedure,                        the patient's blood pressure, pulse, and oxygen                        saturations were monitored continuously. The                        Colonoscope was introduced through the anus and    advanced to the the cecum, identified by appendiceal                        orifice and ileocecal valve. The colonoscopy was                        performed without difficulty. The patient tolerated the                        procedure well. The quality of the bowel preparation                        was excellent. Findings:      The perianal and digital rectal examinations were normal.      A 3 mm polyp was found in the ascending colon. The polyp was sessile.       The polyp was removed with a cold biopsy forceps. Resection and       retrieval were complete.      A 3 mm polyp was found in the descending colon. The polyp was sessile.       The polyp was removed with a cold biopsy forceps. Resection and       retrieval were complete.      A 3 mm polyp was found in the sigmoid colon. The polyp was sessile. The       polyp was removed with a cold biopsy forceps. Resection and retrieval       were complete.      Multiple small-mouthed diverticula were found in the entire colon.      Non-bleeding internal hemorrhoids were found during retroflexion. The       hemorrhoids were Grade II (internal hemorrhoids that prolapse but reduce       spontaneously). Impression:           - One 3 mm polyp in the ascending colon, removed with a                        cold biopsy forceps. Resected and retrieved.                       - One 3 mm polyp in the descending colon, removed with                        a cold biopsy forceps. Resected and retrieved.                       - One 3 mm polyp in the sigmoid colon, removed with a                        cold biopsy forceps. Resected and retrieved.                       -  Diverticulosis in the entire examined colon.                       - Non-bleeding internal hemorrhoids. Recommendation:       - Discharge patient to home.                       - Resume previous diet.                       - Continue present medications.                       - Await  pathology results.                       - Repeat colonoscopy in 3 years for surveillance. Procedure Code(s):    --- Professional ---                       [id number], Colonoscopy, flexible; with biopsy, single or                        multiple Diagnosis Code(s):    --- Professional ---                       Z86.010, Personal history of colonic polyps                       D12.2, Benign neoplasm of ascending colon                       D12.4, Benign neoplasm of descending colon                       D12.5, Benign neoplasm of sigmoid colon CPT copyright 2018 American Medical [employer]. All rights reserved. The codes documented in this report are preliminary and upon coder review may  be revised to meet current compliance requirements. Lucilla Lame MD, MD 04/24/2018 8:31:58 AM This report has been signed electronically. Number of Addenda: 0 Note Initiated On: 04/24/2018 7:59 AM Scope Withdrawal Time: 0 hours 8 minutes 42 seconds  Total Procedure Duration: 0 hours 11 minutes 42 seconds       [facility]

## 2018-04-24 NOTE — Anesthesia Preprocedure Evaluation (Signed)
Anesthesia Evaluation  Patient identified by MRN, date of birth, ID band Patient awake    Reviewed: Allergy & Precautions, H&P , NPO status , Patient's Chart, lab work & pertinent test results, reviewed documented beta blocker date and time   History of Anesthesia Complications Negative for: history of anesthetic complications  Airway Mallampati: III  TM Distance: <3 FB Neck ROM: limited    Dental  (+) Dental Advidsory Given, Missing, Poor Dentition, Upper Dentures   Pulmonary shortness of breath and with exertion, asthma , neg sleep apnea, COPD,  COPD inhaler, neg recent URI, Current Smoker,           Cardiovascular Exercise Tolerance: Good hypertension, (-) angina+ Peripheral Vascular Disease  (-) CAD, (-) Past MI, (-) Cardiac Stents and (-) CABG (-) dysrhythmias (-) Valvular Problems/Murmurs     Neuro/Psych PSYCHIATRIC DISORDERS Depression  Neuromuscular disease    GI/Hepatic Neg liver ROS, GERD  Medicated,  Endo/Other  diabetes, Type 2  Renal/GU negative Renal ROS  negative genitourinary   Musculoskeletal  (+) Arthritis ,   Abdominal   Peds  Hematology negative hematology ROS (+)   Anesthesia Other Findings Past Medical History: No date: Asthma No date: Cancer ([place]) No date: Chronic back pain No date: Degenerative joint disease (DJD) of hip No date: Diabetes mellitus without complication ([facility]) No date: GERD (gastroesophageal reflux disease)     Comment:  takes Omeprazole daily yrs ago: History of bronchitis No date: History of colon polyps     Comment:  benign 10 yrs ago: History of staph infection No date: Hyperlipidemia     Comment:  takes Atorvastatin daily No date: Hypertension No date: Peripheral vascular disease ([facility]) No date: Tobacco use     Comment:  0ne pack of cigaretts daily No date: Weakness     Comment:  numbness and tingling in both legs    Reproductive/Obstetrics negative OB ROS                              Anesthesia Physical  Anesthesia Plan  ASA: III  Anesthesia Plan: General   Post-op Pain Management:    Induction: Intravenous  PONV Risk Score and Plan: 2 and Propofol infusion  Airway Management Planned: Nasal Cannula  Additional Equipment:   Intra-op Plan:   Post-operative Plan:   Informed Consent: I have reviewed the patients History and Physical, chart, labs and discussed the procedure including the risks, benefits and alternatives for the proposed anesthesia with the patient or authorized representative who has indicated his/her understanding and acceptance.     Dental Advisory Given  Plan Discussed with: Anesthesiologist, CRNA and Surgeon  Anesthesia Plan Comments: (Patient consented for risks of anesthesia including but not limited to:  - adverse reactions to medications - risk of intubation if required - damage to teeth, lips or other oral mucosa - sore throat or hoarseness - Damage to heart, brain, lungs or loss of life  Patient voiced understanding.)        Anesthesia Quick Evaluation

## 2018-04-24 NOTE — Anesthesia Post-op Follow-up Note (Signed)
Anesthesia QCDR form completed.        

## 2018-04-25 ENCOUNTER — Encounter: Payer: Self-pay | Admitting: Gastroenterology

## 2018-04-25 LAB — SURGICAL PATHOLOGY

## 2018-04-26 ENCOUNTER — Encounter: Payer: Self-pay | Admitting: Gastroenterology

## 2018-05-09 ENCOUNTER — Encounter: Payer: Self-pay | Admitting: Family Medicine

## 2018-05-09 ENCOUNTER — Ambulatory Visit (INDEPENDENT_AMBULATORY_CARE_PROVIDER_SITE_OTHER): Payer: Medicare Other | Admitting: Family Medicine

## 2018-05-09 VITALS — BP 144/89 | HR 82 | Temp 98.3°F | Ht 66.5 in | Wt 237.4 lb

## 2018-05-09 DIAGNOSIS — F331 Major depressive disorder, recurrent, moderate: Secondary | ICD-10-CM | POA: Diagnosis not present

## 2018-05-09 DIAGNOSIS — Z23 Encounter for immunization: Secondary | ICD-10-CM

## 2018-05-09 DIAGNOSIS — J411 Mucopurulent chronic bronchitis: Secondary | ICD-10-CM

## 2018-05-09 DIAGNOSIS — I10 Essential (primary) hypertension: Secondary | ICD-10-CM

## 2018-05-09 DIAGNOSIS — E1151 Type 2 diabetes mellitus with diabetic peripheral angiopathy without gangrene: Secondary | ICD-10-CM

## 2018-05-09 DIAGNOSIS — Z122 Encounter for screening for malignant neoplasm of respiratory organs: Secondary | ICD-10-CM | POA: Diagnosis not present

## 2018-05-09 DIAGNOSIS — I700 Atherosclerosis of aorta: Secondary | ICD-10-CM | POA: Diagnosis not present

## 2018-05-09 DIAGNOSIS — I75023 Atheroembolism of bilateral lower extremities: Secondary | ICD-10-CM

## 2018-05-09 DIAGNOSIS — E119 Type 2 diabetes mellitus without complications: Secondary | ICD-10-CM

## 2018-05-09 DIAGNOSIS — K219 Gastro-esophageal reflux disease without esophagitis: Secondary | ICD-10-CM

## 2018-05-09 LAB — URINALYSIS, ROUTINE W REFLEX MICROSCOPIC
Bilirubin, UA: NEGATIVE
Glucose, UA: NEGATIVE
Ketones, UA: NEGATIVE
LEUKOCYTES UA: NEGATIVE
Nitrite, UA: NEGATIVE
Protein, UA: NEGATIVE
RBC, UA: NEGATIVE
Specific Gravity, UA: 1.025 (ref 1.005–1.030)
Urobilinogen, Ur: 0.2 mg/dL (ref 0.2–1.0)
pH, UA: 5.5 (ref 5.0–7.5)

## 2018-05-09 MED ORDER — PANTOPRAZOLE SODIUM 40 MG PO TBEC: 40.0000 mg | DELAYED_RELEASE_TABLET | Freq: Every day | ORAL | 4 refills | Status: DC

## 2018-05-09 MED ORDER — LISINOPRIL 10 MG PO TABS: 10.0000 mg | ORAL_TABLET | Freq: Every day | ORAL | 4 refills | Status: DC

## 2018-05-09 MED ORDER — FLUOXETINE HCL 20 MG PO CAPS: 20.0000 mg | ORAL_CAPSULE | Freq: Every day | ORAL | 4 refills | Status: DC

## 2018-05-09 MED ORDER — TRAZODONE HCL 50 MG PO TABS: 50.0000 mg | ORAL_TABLET | Freq: Every evening | ORAL | 4 refills | Status: DC | PRN

## 2018-05-09 MED ORDER — ALBUTEROL SULFATE HFA 108 (90 BASE) MCG/ACT IN AERS: 2.0000 | INHALATION_SPRAY | Freq: Four times a day (QID) | RESPIRATORY_TRACT | 3 refills | Status: DC | PRN

## 2018-05-09 MED ORDER — METFORMIN HCL 500 MG PO TABS: 1000.0000 mg | ORAL_TABLET | Freq: Two times a day (BID) | ORAL | 4 refills | Status: DC

## 2018-05-09 MED ORDER — PIOGLITAZONE HCL 30 MG PO TABS: 30.0000 mg | ORAL_TABLET | Freq: Every day | ORAL | 4 refills | Status: DC

## 2018-05-09 NOTE — Assessment & Plan Note (Signed)
Stable followed by vascular.  

## 2018-05-09 NOTE — Assessment & Plan Note (Signed)
Continue diabetes medications stable

## 2018-05-09 NOTE — Assessment & Plan Note (Signed)
The current medical regimen is effective;  continue present plan and medications.  

## 2018-05-09 NOTE — Assessment & Plan Note (Signed)
The current medical regimen is effective;  continue present plan and medications. We will set up screening CT for lung cancer

## 2018-05-09 NOTE — Assessment & Plan Note (Signed)
Stable

## 2018-05-09 NOTE — Progress Notes (Signed)
BP (!) 144/89   Pulse 82   Temp 98.3 F (36.8 C) (Oral)   Ht 5' 6.5" (1.689 m)   Wt 237 lb 6.4 oz (107.7 kg)   SpO2 95%   BMI 37.74 kg/m    Subjective:    Patient ID: Stacey Gross, female    DOB: 1960-09-25, 58 y.o.   MRN: [id number]  HPI: Stacey Gross is a 58 y.o. female  Chief Complaint  Patient presents with  . Annual Exam  Patient's been able to with Chantix help cut back on her smoking is down to 8 cigarettes a day. Has not had lung cancer screening through CT will set up for same.  Has been smoking since she was 58 years old. Patient's breathing otherwise doing okay likes albuterol and uses Symbicort. Takes atorvastatin without problems. Takes Plavix which was started by vascular surgery. Taking fluoxetine without problems. Metformin for diabetes doing well with no low blood sugar spells.   Relevant past medical, surgical, family and social history reviewed and updated as indicated. Interim medical history since our last visit reviewed. Allergies and medications reviewed and updated.  Review of Systems  Constitutional: Negative.   HENT: Negative.   Eyes: Negative.   Respiratory: Negative.   Cardiovascular: Negative.   Gastrointestinal: Negative.   Endocrine: Negative.   Genitourinary: Negative.   Musculoskeletal: Negative.   Skin: Negative.   Allergic/Immunologic: Negative.   Neurological: Negative.   Hematological: Negative.   Psychiatric/Behavioral: Negative.     Per HPI unless specifically indicated above     Objective:    BP (!) 144/89   Pulse 82   Temp 98.3 F (36.8 C) (Oral)   Ht 5' 6.5" (1.689 m)   Wt 237 lb 6.4 oz (107.7 kg)   SpO2 95%   BMI 37.74 kg/m   Wt Readings from Last 3 Encounters:  05/09/18 237 lb 6.4 oz (107.7 kg)  04/24/18 230 lb (104.3 kg)  03/23/18 235 lb 12.8 oz (107 kg)    Physical Exam Constitutional:      Appearance: She is well-developed.  HENT:     Head: Normocephalic and atraumatic.     Right Ear:  External ear normal.     Left Ear: External ear normal.     Nose: Nose normal.  Eyes:     Conjunctiva/sclera: Conjunctivae normal.     Pupils: Pupils are equal, round, and reactive to light.  Neck:     Musculoskeletal: Normal range of motion and neck supple.     Vascular: No carotid bruit.  Cardiovascular:     Rate and Rhythm: Normal rate and regular rhythm.     Heart sounds: Normal heart sounds. No murmur.  Pulmonary:     Effort: Pulmonary effort is normal.     Breath sounds: Normal breath sounds.  Abdominal:     General: Bowel sounds are normal.     Palpations: Abdomen is soft.  Musculoskeletal: Normal range of motion.  Skin:    Findings: No rash.  Neurological:     Mental Status: She is alert and oriented to person, place, and time.  Psychiatric:        Behavior: Behavior normal.        Thought Content: Thought content normal.        Judgment: Judgment normal.     Results for orders placed or performed during the hospital encounter of 04/24/18  Glucose, capillary  Result Value Ref Range   Glucose-Capillary 113 (H) 70 - 99 mg/dL  Surgical pathology  Result Value Ref Range   SURGICAL PATHOLOGY      Surgical Pathology CASE: [id number] PATIENT: [facility] Surgical Pathology Report     SPECIMEN SUBMITTED: A. Colon polyp, ascending; cbx B. Colon polyp, descending; cbx C. Colon polyp, sigmoid; cbx  CLINICAL HISTORY: None provided  PRE-OPERATIVE DIAGNOSIS: History of colon polyps  POST-OPERATIVE DIAGNOSIS: Diverticulosis, colon polyps     DIAGNOSIS: A.  COLON POLYP, ASCENDING; COLD BIOPSY: - TUBULAR ADENOMA. - NEGATIVE FOR HIGH-GRADE DYSPLASIA AND MALIGNANCY.  B.  COLON POLYP, DESCENDING; COLD BIOPSY: - TUBULAR ADENOMA. - NEGATIVE FOR HIGH-GRADE DYSPLASIA AND MALIGNANCY.  C.  COLON POLYP, SIGMOID; COLD BIOPSY: - HYPERPLASTIC POLYP. - NEGATIVE FOR DYSPLASIA AND MALIGNANCY.   GROSS DESCRIPTION: A. Labeled: C BX ascending colon  polyp Received: Formalin Tissue fragment(s): 1 Size: 0.3 cm Description: Tan soft tissue fragment Entirely submitted in 1 cassette.  B. Labeled: C BX descending colon polyp Received: Formalin Tissue fragment(s) : 1 Size: 0.3 cm Description: Tan soft tissue fragment Entirely submitted in 1 cassette.  C. Labeled: C BX sigmoid colon polyp Received: Formalin Tissue fragment(s): 3 Size: 0.2-0.3 cm Description: Tan soft tissue fragments Entirely submitted in 1 cassette.   Final Diagnosis performed by Quay Burow, MD.   Electronically signed 04/25/2018 1:53:00PM The electronic signature indicates that the named Attending Pathologist has evaluated the specimen  Technical component performed at [facility], [address] [place], [place] [address] Lab: [phone] Dir: Rush Farmer, MD, MMM  Professional component performed at [facility], [facility], [place], [place], [place] [address] Lab: [phone] Dir: Dellia Nims. Rubinas, MD       Assessment & Plan:   Problem List Items Addressed This Visit      Cardiovascular and Mediastinum   Hypertension   Relevant Medications   lisinopril (PRINIVIL,ZESTRIL) 10 MG tablet   Other Relevant Orders   TSH   Blue toe syndrome (HCC)    Stable followed by vascular      Relevant Medications   lisinopril (PRINIVIL,ZESTRIL) 10 MG tablet   Aortic atherosclerosis (HCC)    Stable       Relevant Medications   lisinopril (PRINIVIL,ZESTRIL) 10 MG tablet   Other Relevant Orders   Lipid panel   Urinalysis, Routine w reflex microscopic   Type 2 diabetes, controlled, with peripheral circulatory disorder (HCC)    Continue diabetes medications stable      Relevant Medications   pioglitazone (ACTOS) 30 MG tablet   lisinopril (PRINIVIL,ZESTRIL) 10 MG tablet   metFORMIN (GLUCOPHAGE) 500 MG tablet     Respiratory   COPD (chronic obstructive pulmonary disease) (HCC)    The current medical regimen is effective;  continue  present plan and medications. We will set up screening CT for lung cancer      Relevant Medications   albuterol (PROVENTIL HFA;VENTOLIN HFA) 108 (90 Base) MCG/ACT inhaler     Digestive   GERD (gastroesophageal reflux disease)    The current medical regimen is effective;  continue present plan and medications.       Relevant Medications   pantoprazole (PROTONIX) 40 MG tablet     Endocrine   Diabetes (HCC)   Relevant Medications   pioglitazone (ACTOS) 30 MG tablet   lisinopril (PRINIVIL,ZESTRIL) 10 MG tablet   metFORMIN (GLUCOPHAGE) 500 MG tablet   Other Relevant Orders   CBC with Differential/Platelet   Comprehensive metabolic panel   TSH   Urinalysis, Routine w reflex microscopic     Other   Depression  Relevant Medications   traZODone (DESYREL) 50 MG tablet   FLUoxetine (PROZAC) 20 MG capsule   Other Relevant Orders   CBC with Differential/Platelet   Comprehensive metabolic panel   TSH   Urinalysis, Routine w reflex microscopic    Other Visit Diagnoses    Need for influenza vaccination    -  Primary   Relevant Orders   Flu Vaccine QUAD 36+ mos IM   Encounter for screening for lung cancer       Relevant Orders   CT CHEST LUNG CA SCREEN LOW DOSE W/O CM       Follow up plan: Return in about 6 months (around 11/07/2018) for BMP,  Lipids, ALT, AST, Hemoglobin A1c.

## 2018-05-09 NOTE — Patient Instructions (Signed)
Influenza (Flu) Vaccine (Inactivated or Recombinant): What You Need to Know  1. Why get vaccinated?  Influenza vaccine can prevent influenza (flu).  Flu is a contagious disease that spreads around the [place] every year, usually between October and May. Anyone can get the flu, but it is more dangerous for some people. Infants and young children, people 60 years of age and older, pregnant women, and people with certain health conditions or a weakened immune system are at greatest risk of flu complications.  Pneumonia, bronchitis, sinus infections and ear infections are examples of flu-related complications. If you have a medical condition, such as heart disease, cancer or diabetes, flu can make it worse.  Flu can cause fever and chills, sore throat, muscle aches, fatigue, cough, headache, and runny or stuffy nose. Some people may have vomiting and diarrhea, though this is more common in children than adults.  Each year thousands of people in the [place] die from flu, and many more are hospitalized. Flu vaccine prevents millions of illnesses and flu-related visits to the doctor each year.  2. Influenza vaccine  CDC recommends everyone 6 months of age and older get vaccinated every flu season. Children 6 months through 8 years of age may need 2 doses during a single flu season. Everyone else needs only 1 dose each flu season.  It takes about 2 weeks for protection to develop after vaccination.  There are many flu viruses, and they are always changing. Each year a new flu vaccine is made to protect against three or four viruses that are likely to cause disease in the upcoming flu season. Even when the vaccine doesn't exactly match these viruses, it may still provide some protection.  Influenza vaccine does not cause flu.  Influenza vaccine may be given at the same time as other vaccines.  3. Talk with your health care provider  Tell your vaccine provider if the person getting the vaccine:  · Has had an  allergic reaction after a previous dose of influenza vaccine, or has any severe, life-threatening allergies.  · Has ever had Guillain-Barré Syndrome (also called GBS).  In some cases, your health care provider may decide to postpone influenza vaccination to a future visit.  People with minor illnesses, such as a cold, may be vaccinated. People who are moderately or severely ill should usually wait until they recover before getting influenza vaccine.  Your health care provider can give you more information.  4. Risks of a vaccine reaction  · Soreness, redness, and swelling where shot is given, fever, muscle aches, and headache can happen after influenza vaccine.  · There may be a very small increased risk of Guillain-Barré Syndrome (GBS) after inactivated influenza vaccine (the flu shot).  Young children who get the flu shot along with pneumococcal vaccine (PCV13), and/or DTaP vaccine at the same time might be slightly more likely to have a seizure caused by fever. Tell your health care provider if a child who is getting flu vaccine has ever had a seizure.  People sometimes faint after medical procedures, including vaccination. Tell your provider if you feel dizzy or have vision changes or ringing in the ears.  As with any medicine, there is a very remote chance of a vaccine causing a severe allergic reaction, other serious injury, or death.  5. What if there is a serious problem?  An allergic reaction could occur after the vaccinated person leaves the clinic. If you see signs of a severe allergic reaction (hives, swelling   of the face and throat, difficulty breathing, a fast heartbeat, dizziness, or weakness), call 9-1-1 and get the person to the nearest hospital.  For other signs that concern you, call your health care provider.  Adverse reactions should be reported to the Vaccine Adverse Event Reporting System (VAERS). Your health care provider will usually file this report, or you can do it yourself. Visit the  VAERS website at www.vaers.hhs.gov or call 1-800-822-7967.VAERS is only for reporting reactions, and VAERS staff do not give medical advice.  6. The National Vaccine Injury Compensation Program  The National Vaccine Injury Compensation Program (VICP) is a federal program that was created to compensate people who may have been injured by certain vaccines. Visit the VICP website at www.hrsa.gov/vaccinecompensation or call 1-800-338-2382 to learn about the program and about filing a claim. There is a time limit to file a claim for compensation.  7. How can I learn more?  · Ask your healthcare provider.  · Call your local or state health department.  · Contact the Centers for Disease Control and Prevention (CDC):  ? Call 1-800-232-4636 (1-800-CDC-INFO) or  ? Visit CDC's www.cdc.gov/flu  Vaccine Information Statement (Interim) Inactivated Influenza Vaccine (11/16/2017)  This information is not intended to replace advice given to you by your health care provider. Make sure you discuss any questions you have with your health care provider.  Document Released: 01/13/2006 Document Revised: 11/20/2017 Document Reviewed: 11/20/2017  Elsevier Interactive Patient Education © 2019 [employer].

## 2018-05-10 ENCOUNTER — Encounter: Payer: Self-pay | Admitting: Family Medicine

## 2018-05-10 ENCOUNTER — Telehealth: Payer: Self-pay | Admitting: *Deleted

## 2018-05-10 DIAGNOSIS — Z122 Encounter for screening for malignant neoplasm of respiratory organs: Secondary | ICD-10-CM

## 2018-05-10 DIAGNOSIS — Z87891 Personal history of nicotine dependence: Secondary | ICD-10-CM

## 2018-05-10 LAB — CBC WITH DIFFERENTIAL/PLATELET
Basophils Absolute: 0.1 10*3/uL (ref 0.0–0.2)
Basos: 1 %
EOS (ABSOLUTE): 0.1 10*3/uL (ref 0.0–0.4)
Eos: 1 %
Hematocrit: 39.7 % (ref 34.0–46.6)
Hemoglobin: 13.8 g/dL (ref 11.1–15.9)
Immature Grans (Abs): 0 10*3/uL (ref 0.0–0.1)
Immature Granulocytes: 1 %
Lymphocytes Absolute: 2.6 10*3/uL (ref 0.7–3.1)
Lymphs: 31 %
MCH: 31.1 pg (ref 26.6–33.0)
MCHC: 34.8 g/dL (ref 31.5–35.7)
MCV: 89 fL (ref 79–97)
Monocytes Absolute: 0.5 10*3/uL (ref 0.1–0.9)
Monocytes: 6 %
NEUTROS ABS: 5.1 10*3/uL (ref 1.4–7.0)
Neutrophils: 60 %
Platelets: 298 10*3/uL (ref 150–450)
RBC: 4.44 x10E6/uL (ref 3.77–5.28)
RDW: 12.1 % (ref 11.7–15.4)
WBC: 8.5 10*3/uL (ref 3.4–10.8)

## 2018-05-10 LAB — LIPID PANEL
Chol/HDL Ratio: 5.2 ratio — ABNORMAL HIGH (ref 0.0–4.4)
Cholesterol, Total: 203 mg/dL — ABNORMAL HIGH (ref 100–199)
HDL: 39 mg/dL — ABNORMAL LOW (ref >39)
LDL Calculated: 125 mg/dL — ABNORMAL HIGH (ref 0–99)
Triglycerides: 195 mg/dL — ABNORMAL HIGH (ref 0–149)
VLDL Cholesterol Cal: 39 mg/dL (ref 5–40)

## 2018-05-10 LAB — COMPREHENSIVE METABOLIC PANEL
ALT: 23 IU/L (ref 0–32)
AST: 23 IU/L (ref 0–40)
Albumin/Globulin Ratio: 1.7 (ref 1.2–2.2)
Albumin: 4.3 g/dL (ref 3.8–4.9)
Alkaline Phosphatase: 100 IU/L (ref 39–117)
BILIRUBIN TOTAL: 0.3 mg/dL (ref 0.0–1.2)
BUN / CREAT RATIO: 23 (ref 12–28)
BUN: 20 mg/dL (ref 8–27)
CO2: 25 mmol/L (ref 20–29)
Calcium: 9.9 mg/dL (ref 8.7–10.3)
Chloride: 100 mmol/L (ref 96–106)
Creatinine, Ser: 0.88 mg/dL (ref 0.57–1.00)
GFR calc Af Amer: 83 mL/min/{1.73_m2} (ref >59)
GFR calc non Af Amer: 72 mL/min/{1.73_m2} (ref >59)
Globulin, Total: 2.5 g/dL (ref 1.5–4.5)
Glucose: 105 mg/dL — ABNORMAL HIGH (ref 65–99)
Potassium: 4.8 mmol/L (ref 3.5–5.2)
Sodium: 139 mmol/L (ref 134–144)
Total Protein: 6.8 g/dL (ref 6.0–8.5)

## 2018-05-10 LAB — TSH: TSH: 0.647 u[IU]/mL (ref 0.450–4.500)

## 2018-05-10 NOTE — Telephone Encounter (Signed)
Received referral for initial lung cancer screening scan. Contacted patient and obtained smoking history,(current, 96 pack year) as well as answering questions related to screening process. Patient denies signs of lung cancer such as weight loss or hemoptysis. Patient denies comorbidity that would prevent curative treatment if lung cancer were found. Patient is scheduled for shared decision making visit and CT scan on 05/29/18 at 130.

## 2018-05-22 ENCOUNTER — Ambulatory Visit: Payer: Self-pay | Admitting: Gastroenterology

## 2018-05-22 ENCOUNTER — Other Ambulatory Visit: Payer: Self-pay

## 2018-05-22 ENCOUNTER — Encounter: Payer: Self-pay | Admitting: Gastroenterology

## 2018-05-22 ENCOUNTER — Ambulatory Visit: Payer: Medicare Other | Admitting: Gastroenterology

## 2018-05-22 VITALS — BP 137/82 | HR 96 | Ht 66.5 in | Wt 239.0 lb

## 2018-05-22 DIAGNOSIS — K5904 Chronic idiopathic constipation: Secondary | ICD-10-CM

## 2018-05-22 NOTE — Progress Notes (Signed)
Primary Care Physician: Guadalupe Maple, MD  Primary Gastroenterologist:  Dr. Lucilla Lame  Chief Complaint  Patient presents with  . change in bowel habits    HPI: Stacey Gross is a 58 y.o. female here for chronic constipation.  The patient states that she is constipated quite frequently and will only move her bowels once a week.  The patient states that she takes different medications to help her move her bowels when she gets constipated.  She does not take anything on a regular basis.  There is no report of any unexplained weight loss fevers chills nausea vomiting.  The patient has had 2 colonoscopies with polyps recently the second one 6 months after the first 1 due to a poor prep.  The patient has been recommended to have a repeat colonoscopy in 3 years.  The patient denies any bright red blood per rectum.  Current Outpatient Medications  Medication Sig Dispense Refill  . albuterol (PROVENTIL HFA;VENTOLIN HFA) 108 (90 Base) MCG/ACT inhaler Inhale 2 puffs into the lungs every 6 (six) hours as needed for wheezing or shortness of breath. 1 Inhaler 3  . atorvastatin (LIPITOR) 20 MG tablet Take 1 tablet (20 mg total) by mouth at bedtime. 90 tablet 4  . Blood Glucose Monitoring Suppl (ONE TOUCH ULTRA SYSTEM KIT) W/DEVICE KIT 1 kit by Does not apply route once. (Patient taking differently: 1 kit by Does not apply route daily. ) 100 each 12  . budesonide-formoterol (SYMBICORT) 160-4.5 MCG/ACT inhaler Inhale 2 puffs into the lungs 2 (two) times daily.    . clopidogrel (PLAVIX) 75 MG tablet Take 75 mg by mouth daily.    . cyclobenzaprine (FLEXERIL) 10 MG tablet TAKE ONE TABLET BY MOUTH AT BEDTIME. 30 tablet 3  . FLUoxetine (PROZAC) 20 MG capsule Take 1 capsule (20 mg total) by mouth daily. 90 capsule 4  . glucose blood test strip Use as instructed 100 each 12  . lisinopril (PRINIVIL,ZESTRIL) 10 MG tablet Take 1 tablet (10 mg total) by mouth daily. 90 tablet 4  . metFORMIN (GLUCOPHAGE) 500  MG tablet Take 2 tablets (1,000 mg total) by mouth 2 (two) times daily. 360 tablet 4  . omeprazole (PRILOSEC) 20 MG capsule Take 1 capsule (20 mg total) by mouth 2 (two) times daily before a meal. 180 capsule 1  . pantoprazole (PROTONIX) 40 MG tablet Take 1 tablet (40 mg total) by mouth daily. 90 tablet 4  . pioglitazone (ACTOS) 30 MG tablet Take 1 tablet (30 mg total) by mouth daily. 90 tablet 4  . traZODone (DESYREL) 50 MG tablet Take 1 tablet (50 mg total) by mouth at bedtime as needed. for sleep 90 tablet 4  . varenicline (CHANTIX CONTINUING MONTH PAK) 1 MG tablet Take 1 tablet (1 mg total) by mouth 2 (two) times daily. Start this only after finished initial 7 day start up. 60 tablet 2   No current facility-administered medications for this visit.     Allergies as of 05/22/2018 - Review Complete 05/22/2018  Allergen Reaction Noted  . Doxycycline hyclate Anaphylaxis, Swelling, and Rash   . Latex Rash 09/18/2013  . Vancomycin Rash     ROS:  General: Negative for anorexia, weight loss, fever, chills, fatigue, weakness. ENT: Negative for hoarseness, difficulty swallowing , nasal congestion. CV: Negative for chest pain, angina, palpitations, dyspnea on exertion, peripheral edema.  Respiratory: Negative for dyspnea at rest, dyspnea on exertion, cough, sputum, wheezing.  GI: See history of present illness. GU:  Negative for dysuria, hematuria, urinary incontinence, urinary frequency, nocturnal urination.  Endo: Negative for unusual weight change.    Physical Examination:   BP 137/82   Pulse 96   Ht 5' 6.5" (1.689 m)   Wt 239 lb (108.4 kg)   BMI 38.00 kg/m   General: Well-nourished, well-developed in no acute distress.  Eyes: No icterus. Conjunctivae pink. Mouth: Oropharyngeal mucosa moist and pink , no lesions erythema or exudate. Lungs: Clear to auscultation bilaterally. Non-labored. Heart: Regular rate and rhythm, no murmurs rubs or gallops.  Abdomen: Bowel sounds are normal,  nontender, nondistended, no hepatosplenomegaly or masses, no abdominal bruits or hernia , no rebound or guarding.   Extremities: No lower extremity edema. No clubbing or deformities. Neuro: Alert and oriented x 3.  Grossly intact. Skin: Warm and dry, no jaundice.   Psych: Alert and cooperative, normal mood and affect.  Labs:    Imaging Studies: No results found.  Assessment and Plan:   Stacey Gross is a 58 y.o. y/o female who has a history of chronic constipation.  The patient has had an obstructing lesion ruled out with her recent colonoscopies.  The patient reports that she has Linzess at home but does not recall the dose.  The patient has been told that she should take this every day instead of as needed and she will contact me if she needs a refill or if the medication is not working.  The patient has been explained the plan and agrees with it.    Lucilla Lame, MD. Marval Regal   Note: This dictation was prepared with Dragon dictation along with smaller phrase technology. Any transcriptional errors that result from this process are unintentional.

## 2018-05-25 ENCOUNTER — Encounter (INDEPENDENT_AMBULATORY_CARE_PROVIDER_SITE_OTHER): Payer: Self-pay | Admitting: Vascular Surgery

## 2018-05-25 ENCOUNTER — Other Ambulatory Visit: Payer: Self-pay

## 2018-05-25 ENCOUNTER — Ambulatory Visit (INDEPENDENT_AMBULATORY_CARE_PROVIDER_SITE_OTHER): Payer: Medicare Other | Admitting: Vascular Surgery

## 2018-05-25 VITALS — BP 152/93 | HR 73 | Resp 16 | Ht 67.0 in | Wt 238.6 lb

## 2018-05-25 DIAGNOSIS — I75023 Atheroembolism of bilateral lower extremities: Secondary | ICD-10-CM

## 2018-05-25 DIAGNOSIS — I10 Essential (primary) hypertension: Secondary | ICD-10-CM

## 2018-05-25 DIAGNOSIS — E782 Mixed hyperlipidemia: Secondary | ICD-10-CM | POA: Diagnosis not present

## 2018-05-25 DIAGNOSIS — E119 Type 2 diabetes mellitus without complications: Secondary | ICD-10-CM | POA: Diagnosis not present

## 2018-05-25 NOTE — Assessment & Plan Note (Signed)
Symptoms are really not improving as well as they should for somebody his perfusion was found to be reasonably well intact several months ago.  I will plan to see her back in 1 to 2 months with repeat noninvasive studies or sooner if worsening problems develop in the interim.

## 2018-05-25 NOTE — Patient Instructions (Signed)
Peripheral Vascular Disease  Peripheral vascular disease (PVD) is a disease of the blood vessels that are not part of your heart and brain. A simple term for PVD is poor circulation. In most cases, PVD narrows the blood vessels that carry blood from your heart to the rest of your body. This can reduce the supply of blood to your arms, legs, and internal organs, like your stomach or kidneys. However, PVD most often affects a person's lower legs and feet. Without treatment, PVD tends to get worse. PVD can also lead to acute ischemic limb. This is when an arm or leg suddenly cannot get enough blood. This is a medical emergency. Follow these instructions at home: Lifestyle  Do not use any products that contain nicotine or tobacco, such as cigarettes and e-cigarettes. If you need help quitting, ask your doctor.  Lose weight if you are overweight. Or, stay at a healthy weight as told by your doctor.  Eat a diet that is low in fat and cholesterol. If you need help, ask your doctor.  Exercise regularly. Ask your doctor for activities that are right for you. General instructions  Take over-the-counter and prescription medicines only as told by your doctor.  Take good care of your feet: ? Wear comfortable shoes that fit well. ? Check your feet often for any cuts or sores.  Keep all follow-up visits as told by your doctor This is important. Contact a doctor if:  You have cramps in your legs when you walk.  You have leg pain when you are at rest.  You have coldness in a leg or foot.  Your skin changes.  You are unable to get or have an erection (erectile dysfunction).  You have cuts or sores on your feet that do not heal. Get help right away if:  Your arm or leg turns cold, numb, and blue.  Your arms or legs become red, warm, swollen, painful, or numb.  You have chest pain.  You have trouble breathing.  You suddenly have weakness in your face, arm, or leg.  You become very  confused or you cannot speak.  You suddenly have a very bad headache.  You suddenly cannot see. Summary  Peripheral vascular disease (PVD) is a disease of the blood vessels.  A simple term for PVD is poor circulation. Without treatment, PVD tends to get worse.  Treatment may include exercise, low fat and low cholesterol diet, and quitting smoking. This information is not intended to replace advice given to you by your health care provider. Make sure you discuss any questions you have with your health care provider. Document Released: 06/15/2009 Document Revised: 04/28/2016 Document Reviewed: 04/28/2016 Elsevier Interactive Patient Education  2019 Elsevier Inc.  

## 2018-05-25 NOTE — Progress Notes (Signed)
MRN : [id number]  Stacey Gross is a 58 y.o. ([birth year]) female who presents with chief complaint of  Chief Complaint  Patient presents with  . Follow-up    19monthfollow up  .  History of Present Illness: Patient returns today in follow up of her foot pain and blue toe syndrome.  Her right second toe remains discolored and painful.  She has pain in both feet at rest.  They hurt worse when she walks.  She says sometimes she wished she would just cut her legs off.  Current Outpatient Medications  Medication Sig Dispense Refill  . albuterol (PROVENTIL HFA;VENTOLIN HFA) 108 (90 Base) MCG/ACT inhaler Inhale 2 puffs into the lungs every 6 (six) hours as needed for wheezing or shortness of breath. 1 Inhaler 3  . atorvastatin (LIPITOR) 20 MG tablet Take 1 tablet (20 mg total) by mouth at bedtime. 90 tablet 4  . Blood Glucose Monitoring Suppl (ONE TOUCH ULTRA SYSTEM KIT) W/DEVICE KIT 1 kit by Does not apply route once. (Patient taking differently: 1 kit by Does not apply route daily. ) 100 each 12  . budesonide-formoterol (SYMBICORT) 160-4.5 MCG/ACT inhaler Inhale 2 puffs into the lungs 2 (two) times daily.    . clopidogrel (PLAVIX) 75 MG tablet Take 75 mg by mouth daily.    . cyclobenzaprine (FLEXERIL) 10 MG tablet TAKE ONE TABLET BY MOUTH AT BEDTIME. 30 tablet 3  . FLUoxetine (PROZAC) 20 MG capsule Take 1 capsule (20 mg total) by mouth daily. 90 capsule 4  . glucose blood test strip Use as instructed 100 each 12  . lisinopril (PRINIVIL,ZESTRIL) 10 MG tablet Take 1 tablet (10 mg total) by mouth daily. 90 tablet 4  . metFORMIN (GLUCOPHAGE) 500 MG tablet Take 2 tablets (1,000 mg total) by mouth 2 (two) times daily. 360 tablet 4  . pantoprazole (PROTONIX) 40 MG tablet Take 1 tablet (40 mg total) by mouth daily. 90 tablet 4  . pioglitazone (ACTOS) 30 MG tablet Take 1 tablet (30 mg total) by mouth daily. 90 tablet 4  . traZODone (DESYREL) 50 MG tablet Take 1 tablet (50 mg total) by mouth at  bedtime as needed. for sleep 90 tablet 4  . varenicline (CHANTIX CONTINUING MONTH PAK) 1 MG tablet Take 1 tablet (1 mg total) by mouth 2 (two) times daily. Start this only after finished initial 7 day start up. 60 tablet 2  . omeprazole (PRILOSEC) 20 MG capsule Take 1 capsule (20 mg total) by mouth 2 (two) times daily before a meal. (Patient not taking: Reported on 05/25/2018) 180 capsule 1   No current facility-administered medications for this visit.     Past Medical History:  Diagnosis Date  . Asthma   . Cancer ([place]    stomach  . Chronic back pain   . COPD (chronic obstructive pulmonary disease) ([place]   . Degenerative joint disease (DJD) of hip   . Diabetes mellitus without complication ([place]   . Dyspnea   . GERD (gastroesophageal reflux disease)    takes Omeprazole daily  . History of bronchitis yrs ago  . History of colon polyps    benign  . History of staph infection 10 yrs ago  . Hyperlipidemia    takes Atorvastatin daily  . Hypertension   . Peripheral vascular disease ([place]   . Tobacco use    0ne pack of cigaretts daily  . Weakness    numbness and tingling in both legs     Past Surgical  History:  Procedure Laterality Date  . ABDOMINAL HYSTERECTOMY     total  . APPENDECTOMY    . BACK SURGERY  9-10 yrs ago  . BREAST BIOPSY Right    Core- neg  . COLONOSCOPY    . COLONOSCOPY WITH PROPOFOL N/A 09/20/2017   Procedure: COLONOSCOPY WITH PROPOFOL;  Surgeon: Virgel Manifold, MD;  Location: [facility] ENDOSCOPY;  Service: Endoscopy;  Laterality: N/A;  . COLONOSCOPY WITH PROPOFOL N/A 04/24/2018   Procedure: COLONOSCOPY WITH PROPOFOL;  Surgeon: Lucilla Lame, MD;  Location: [facility] ENDOSCOPY;  Service: Endoscopy;  Laterality: N/A;  . ESOPHAGOGASTRODUODENOSCOPY (EGD) WITH PROPOFOL N/A 09/20/2017   Procedure: ESOPHAGOGASTRODUODENOSCOPY (EGD) WITH PROPOFOL;  Surgeon: Virgel Manifold, MD;  Location: [facility] ENDOSCOPY;  Service: Endoscopy;  Laterality: N/A;  . PERIPHERAL VASCULAR  CATHETERIZATION Left 03/17/2016   Procedure: Lower Extremity Angiography;  Surgeon: Algernon Huxley, MD;  Location: [facility];  Service: Cardiovascular;  Laterality: Left;    Family History  Problem Relation Age of Onset  . Hyperlipidemia Mother   . Hyperlipidemia Father   . Cancer Sister     melanoma  No known history of bleeding disorders or clotting disorders.  Familiar with peripheral arterial disease as her husband has had a treated many times  Social History      Social History  Substance Use Topics  . Smoking status: Current Every Day Smoker    Packs/day: 0.50    Years: 43.00    Types: Cigarettes  . Smokeless tobacco: Never Used  . Alcohol use No  No IV drug use      Allergies  Allergen Reactions  . Doxycycline Hyclate Anaphylaxis, Swelling and Rash  . Latex Rash  . Vancomycin Rash      REVIEW OF SYSTEMS(Negative unless checked)  Constitutional: [] ??Weight loss[] ??Fever[] ??Chills Cardiac:[] ??Chest pain[] ??Chest pressure[] ??Palpitations [] ??Shortness of breath when laying flat [] ??Shortness of breath at rest [] ??Shortness of breath with exertion. Vascular: [x] ??Pain in legs with walking[] ??Pain in legsat rest[] ??Pain in legs when laying flat [x] ??Claudication [] ??Pain in feet when walking [x] ??Pain in feet at rest [] ??Pain in feet when laying flat [] ??History of DVT [] ??Phlebitis [] ??Swelling in legs [] ??Varicose veins [] ??Non-healing ulcers Pulmonary: [] ??Uses home oxygen [] ??Productive cough[] ??Hemoptysis [] ??Wheeze [] ??COPD [] ??Asthma Neurologic: [] ??Dizziness [] ??Blackouts [] ??Seizures [] ??History of stroke [] ??History of TIA[] ??Aphasia [] ??Temporary blindness[] ??Dysphagia [] ??Weaknessor numbness in arms [] ??Weakness or numbnessin legs Musculoskeletal: [] ??Arthritis [] ??Joint swelling [] ??Joint pain [x] ??Low back pain Hematologic:[] ??Easy  bruising[] ??Easy bleeding [] ??Hypercoagulable state [] ??Anemic [] ??Hepatitis Gastrointestinal:[] ??Blood in stool[] ??Vomiting blood[] ??Gastroesophageal reflux/heartburn[] ??Abdominal pain Genitourinary: [] ??Chronic kidney disease [] ??Difficulturination [] ??Frequenturination [] ??Burning with urination[] ??Hematuria Skin: [] ??Rashes [] ??Ulcers [] ??Wounds Psychological: [] ??History of anxiety[] ??History of major depression.    Physical Examination  BP (!) 152/93 (BP Location: Right Arm)   Pulse 73   Resp 16   Ht 5' 7"  (1.702 m)   Wt 238 lb 9.6 oz (108.2 kg)   BMI 37.37 kg/m  Gen:  WD/WN, NAD Head: [place]/AT, No temporalis wasting. Ear/Nose/Throat: Hearing grossly intact, nares w/o erythema or drainage Eyes: Conjunctiva clear. Sclera non-icteric Neck: Supple.  Trachea midline Pulmonary:  Good air movement, no use of accessory muscles.  Cardiac: RRR, no JVD Vascular:  Vessel Right Left  Radial Palpable Palpable                          PT  1+ palpable  1+ palpable  DP  1+ palpable Palpable    Musculoskeletal: M/S 5/5 throughout.  No deformity or atrophy.  Right second toe remains quite cyanotic with somewhat sluggish capillary refill.  The rest of the feet are reasonably warm with good capillary refill.  No significant lower extremity edema. Neurologic: Sensation grossly intact in extremities.  Symmetrical.  Speech is fluent.  Psychiatric: Judgment intact, Mood & affect appropriate for pt's clinical situation. Dermatologic: No rashes or ulcers noted.  No cellulitis or open wounds.       Labs Recent Results (from the past 2160 hour(s))  HgB A1c     Status: Abnormal   Collection Time: 03/21/18 11:48 AM  Result Value Ref Range   Hgb A1c MFr Bld 6.7 (H) 4.8 - 5.6 %    Comment:          Prediabetes: 5.7 - 6.4          Diabetes: >6.4          Glycemic control for adults with diabetes: <7.0    Est. average glucose Bld gHb Est-mCnc 146 mg/dL    Glucose, capillary     Status: Abnormal   Collection Time: 04/24/18  7:57 AM  Result Value Ref Range   Glucose-Capillary 113 (H) 70 - 99 mg/dL  Surgical pathology     Status: None   Collection Time: 04/24/18  8:22 AM  Result Value Ref Range   SURGICAL PATHOLOGY      Surgical Pathology CASE: [id number] PATIENT: [facility] Surgical Pathology Report     SPECIMEN SUBMITTED: A. Colon polyp, ascending; cbx B. Colon polyp, descending; cbx C. Colon polyp, sigmoid; cbx  CLINICAL HISTORY: None provided  PRE-OPERATIVE DIAGNOSIS: History of colon polyps  POST-OPERATIVE DIAGNOSIS: Diverticulosis, colon polyps     DIAGNOSIS: A.  COLON POLYP, ASCENDING; COLD BIOPSY: - TUBULAR ADENOMA. - NEGATIVE FOR HIGH-GRADE DYSPLASIA AND MALIGNANCY.  B.  COLON POLYP, DESCENDING; COLD BIOPSY: - TUBULAR ADENOMA. - NEGATIVE FOR HIGH-GRADE DYSPLASIA AND MALIGNANCY.  C.  COLON POLYP, SIGMOID; COLD BIOPSY: - HYPERPLASTIC POLYP. - NEGATIVE FOR DYSPLASIA AND MALIGNANCY.   GROSS DESCRIPTION: A. Labeled: C BX ascending colon polyp Received: Formalin Tissue fragment(s): 1 Size: 0.3 cm Description: Tan soft tissue fragment Entirely submitted in 1 cassette.  B. Labeled: C BX descending colon polyp Received: Formalin Tissue fragment(s) : 1 Size: 0.3 cm Description: Tan soft tissue fragment Entirely submitted in 1 cassette.  C. Labeled: C BX sigmoid colon polyp Received: Formalin Tissue fragment(s): 3 Size: 0.2-0.3 cm Description: Tan soft tissue fragments Entirely submitted in 1 cassette.   Final Diagnosis performed by Quay Burow, MD.   Electronically signed 04/25/2018 1:53:00PM The electronic signature indicates that the named Attending Pathologist has evaluated the specimen  Technical component performed at [facility], [address] [place], [place] [address] Lab: [phone] Dir: Rush Farmer, MD, MMM  Professional component performed at [facility], [facility], [place], [place], [place] [address] Lab: [phone] Dir: Dellia Nims. Rubinas, MD   Urinalysis, Routine w reflex microscopic     Status: None   Collection Time: 05/09/18  2:45 PM  Result Value Ref Range   Specific Gravity, UA 1.025 1.005 - 1.030   pH, UA 5.5 5.0 - 7.5   Color, UA Yellow Yellow   Appearance Ur Clear Clear   Leukocytes, UA Negative Negative   Protein, UA Negative Negative/Trace   Glucose, UA Negative Negative   Ketones, UA Negative Negative   RBC, UA Negative Negative   Bilirubin, UA Negative Negative   Urobilinogen, Ur 0.2 0.2 - 1.0 mg/dL   Nitrite, UA Negative Negative  CBC with Differential/Platelet     Status: None   Collection Time:  05/09/18  2:47 PM  Result Value Ref Range   WBC 8.5 3.4 - 10.8 x10E3/uL   RBC 4.44 3.77 - 5.28 x10E6/uL   Hemoglobin 13.8 11.1 - 15.9 g/dL   Hematocrit 39.7 34.0 - 46.6 %   MCV 89 79 - 97 fL   MCH 31.1 26.6 - 33.0 pg   MCHC 34.8 31.5 - 35.7 g/dL   RDW 12.1 11.7 - 15.4 %   Platelets 298 150 - 450 x10E3/uL   Neutrophils 60 Not Estab. %   Lymphs 31 Not Estab. %   Monocytes 6 Not Estab. %   Eos 1 Not Estab. %   Basos 1 Not Estab. %   Neutrophils Absolute 5.1 1.4 - 7.0 x10E3/uL   Lymphocytes Absolute 2.6 0.7 - 3.1 x10E3/uL   Monocytes Absolute 0.5 0.1 - 0.9 x10E3/uL   EOS (ABSOLUTE) 0.1 0.0 - 0.4 x10E3/uL   Basophils Absolute 0.1 0.0 - 0.2 x10E3/uL   Immature Granulocytes 1 Not Estab. %   Immature Grans (Abs) 0.0 0.0 - 0.1 x10E3/uL  Comprehensive metabolic panel     Status: Abnormal   Collection Time: 05/09/18  2:47 PM  Result Value Ref Range   Glucose 105 (H) 65 - 99 mg/dL   BUN 20 8 - 27 mg/dL   Creatinine, Ser 0.88 0.57 - 1.00 mg/dL   GFR calc non Af Amer 72 >59 mL/min/1.73   GFR calc Af Amer 83 >59 mL/min/1.73   BUN/Creatinine Ratio 23 12 - 28   Sodium 139 134 - 144 mmol/L   Potassium 4.8 3.5 - 5.2 mmol/L   Chloride 100 96 - 106 mmol/L   CO2 25 20 - 29 mmol/L   Calcium 9.9 8.7 - 10.3 mg/dL   Total  Protein 6.8 6.0 - 8.5 g/dL   Albumin 4.3 3.8 - 4.9 g/dL    Comment:               **Please note reference interval change**   Globulin, Total 2.5 1.5 - 4.5 g/dL   Albumin/Globulin Ratio 1.7 1.2 - 2.2   Bilirubin Total 0.3 0.0 - 1.2 mg/dL   Alkaline Phosphatase 100 39 - 117 IU/L   AST 23 0 - 40 IU/L   ALT 23 0 - 32 IU/L  TSH     Status: None   Collection Time: 05/09/18  2:47 PM  Result Value Ref Range   TSH 0.647 0.450 - 4.500 uIU/mL  Lipid panel     Status: Abnormal   Collection Time: 05/09/18  2:47 PM  Result Value Ref Range   Cholesterol, Total 203 (H) 100 - 199 mg/dL   Triglycerides 195 (H) 0 - 149 mg/dL   HDL 39 (L) >39 mg/dL   VLDL Cholesterol Cal 39 5 - 40 mg/dL   LDL Calculated 125 (H) 0 - 99 mg/dL   Chol/HDL Ratio 5.2 (H) 0.0 - 4.4 ratio    Comment:                                   T. Chol/HDL Ratio                                             Men  Women  1/2 Avg.Risk  3.4    3.3                                   Avg.Risk  5.0    4.4                                2X Avg.Risk  9.6    7.1                                3X Avg.Risk 23.4   11.0     Radiology No results found.  Assessment/Plan Hyperlipidemia lipid control important in reducing the progression of atherosclerotic disease. Continue statin therapy   Diabetes blood glucose control important in reducing the progression of atherosclerotic disease. Also, involved in wound healing. On appropriate medications.  Hypertension blood pressure control important in reducing the progression of atherosclerotic disease. On appropriate oral medications.   Blue toe syndrome ([place]) Symptoms are really not improving as well as they should for somebody his perfusion was found to be reasonably well intact several months ago.  I will plan to see her back in 1 to 2 months with repeat noninvasive studies or sooner if worsening problems develop in the interim.    Leotis Pain,  MD  05/25/2018 3:15 PM    This note was created with Dragon medical transcription system.  Any errors from dictation are purely unintentional

## 2018-05-25 NOTE — Assessment & Plan Note (Signed)
blood pressure control important in reducing the progression of atherosclerotic disease. On appropriate oral medications.  

## 2018-05-28 ENCOUNTER — Telehealth: Payer: Self-pay | Admitting: *Deleted

## 2018-05-28 NOTE — Telephone Encounter (Signed)
Called pt to remind her or her appt for ldct screening on 05-29-2018@1330 , voiced understanding

## 2018-05-29 ENCOUNTER — Encounter: Payer: Self-pay | Admitting: Nurse Practitioner

## 2018-05-29 ENCOUNTER — Inpatient Hospital Stay: Payer: Medicare Other | Attending: Oncology | Admitting: Nurse Practitioner

## 2018-05-29 ENCOUNTER — Ambulatory Visit
Admission: RE | Admit: 2018-05-29 | Discharge: 2018-05-29 | Disposition: A | Discharge status: Home / Self Care | Payer: Medicare Other | Source: Ambulatory Visit | Attending: Oncology | Admitting: Oncology

## 2018-05-29 DIAGNOSIS — F17210 Nicotine dependence, cigarettes, uncomplicated: Secondary | ICD-10-CM

## 2018-05-29 DIAGNOSIS — Z720 Tobacco use: Secondary | ICD-10-CM | POA: Diagnosis not present

## 2018-05-29 DIAGNOSIS — Z122 Encounter for screening for malignant neoplasm of respiratory organs: Secondary | ICD-10-CM

## 2018-05-29 DIAGNOSIS — Z87891 Personal history of nicotine dependence: Secondary | ICD-10-CM | POA: Diagnosis not present

## 2018-05-29 NOTE — Progress Notes (Signed)
In accordance with CMS guidelines, patient has met eligibility criteria including age, absence of signs or symptoms of lung cancer.  Social History   Tobacco Use  . Smoking status: Current Every Day Smoker    Packs/day: 2.00    Years: 48.00    Pack years: 96.00    Types: Cigarettes  . Smokeless tobacco: Never Used  Substance Use Topics  . Alcohol use: No  . Drug use: No      A shared decision-making session was conducted prior to the performance of CT scan. This includes one or more decision aids, includes benefits and harms of screening, follow-up diagnostic testing, over-diagnosis, false positive rate, and total radiation exposure.   Counseling on the importance of adherence to annual lung cancer LDCT screening, impact of co-morbidities, and ability or willingness to undergo diagnosis and treatment is imperative for compliance of the program.   Counseling on the importance of continued smoking cessation for former smokers; the importance of smoking cessation for current smokers, and information about tobacco cessation interventions have been given to patient including [place] and 1800 quit [place] programs.   Written order for lung cancer screening with LDCT has been given to the patient and any and all questions have been answered to the best of my abilities.    Yearly follow up will be coordinated by Burgess Estelle, Thoracic Navigator.  Beckey Rutter, DNP, AGNP-C [place] at [facility] [phone] (work cell) [phone] (office) 05/29/18 2:57 PM

## 2018-05-30 ENCOUNTER — Telehealth: Payer: Self-pay | Admitting: *Deleted

## 2018-05-30 NOTE — Telephone Encounter (Signed)
Notified patient of LDCT lung cancer screening program results with recommendation for 12 month follow up imaging. Also notified of incidental findings noted below and is encouraged to discuss further with PCP who will receive a copy of this note and/or the CT report. Patient verbalizes understanding.   IMPRESSION: 1. Lung-RADS 1, negative. Continue annual screening with low-dose chest CT without contrast in 12 months. 2.  Aortic atherosclerosis (ICD10-170.0). 3.  Emphysema (ICD10-J43.9).

## 2018-06-04 NOTE — Telephone Encounter (Signed)
Call pt 

## 2018-06-05 ENCOUNTER — Other Ambulatory Visit: Payer: Self-pay | Admitting: Family Medicine

## 2018-06-05 MED ORDER — AZITHROMYCIN 250 MG PO TABS: ORAL_TABLET | ORAL | 0 refills | Status: DC

## 2018-06-05 NOTE — Telephone Encounter (Signed)
Phone call

## 2018-06-06 ENCOUNTER — Encounter: Payer: Self-pay | Admitting: *Deleted

## 2018-06-11 ENCOUNTER — Ambulatory Visit: Payer: Self-pay | Admitting: *Deleted

## 2018-06-11 ENCOUNTER — Ambulatory Visit
Admission: RE | Admit: 2018-06-11 | Discharge: 2018-06-11 | Disposition: A | Discharge status: Home / Self Care | Payer: Medicare Other | Source: Ambulatory Visit | Attending: Nurse Practitioner | Admitting: Nurse Practitioner

## 2018-06-11 ENCOUNTER — Ambulatory Visit (INDEPENDENT_AMBULATORY_CARE_PROVIDER_SITE_OTHER): Payer: Medicare Other | Admitting: Nurse Practitioner

## 2018-06-11 ENCOUNTER — Ambulatory Visit: Admission: RE | Admit: 2018-06-11 | Payer: Medicare Other | Source: Ambulatory Visit

## 2018-06-11 ENCOUNTER — Other Ambulatory Visit: Payer: Self-pay

## 2018-06-11 ENCOUNTER — Encounter: Payer: Self-pay | Admitting: Nurse Practitioner

## 2018-06-11 VITALS — BP 125/80 | HR 70 | Temp 98.0°F | Ht 67.0 in | Wt 235.0 lb

## 2018-06-11 DIAGNOSIS — R509 Fever, unspecified: Secondary | ICD-10-CM | POA: Diagnosis not present

## 2018-06-11 DIAGNOSIS — J441 Chronic obstructive pulmonary disease with (acute) exacerbation: Secondary | ICD-10-CM | POA: Diagnosis not present

## 2018-06-11 DIAGNOSIS — R05 Cough: Secondary | ICD-10-CM | POA: Diagnosis not present

## 2018-06-11 LAB — CBC WITH DIFFERENTIAL/PLATELET
HEMATOCRIT: 42.6 % (ref 34.0–46.6)
Hemoglobin: 14.4 g/dL (ref 11.1–15.9)
Lymphocytes Absolute: 2.9 10*3/uL (ref 0.7–3.1)
Lymphs: 31 %
MCH: 31.5 pg (ref 26.6–33.0)
MCHC: 33.8 g/dL (ref 31.5–35.7)
MCV: 93 fL (ref 79–97)
MID (Absolute): 0.4 10*3/uL (ref 0.1–1.6)
MID: 4 %
Neutrophils Absolute: 6 10*3/uL (ref 1.4–7.0)
Neutrophils: 65 %
Platelets: 270 10*3/uL (ref 150–450)
RBC: 4.57 x10E6/uL (ref 3.77–5.28)
RDW: 13.1 % (ref 11.7–15.4)
WBC: 9.3 10*3/uL (ref 3.4–10.8)

## 2018-06-11 LAB — VERITOR FLU A/B WAIVED
Influenza A: NEGATIVE
Influenza B: NEGATIVE

## 2018-06-11 MED ORDER — TIOTROPIUM BROMIDE MONOHYDRATE 2.5 MCG/ACT IN AERS: 2.0000 | INHALATION_SPRAY | Freq: Every day | RESPIRATORY_TRACT | 3 refills | Status: DC

## 2018-06-11 MED ORDER — TRIAMCINOLONE ACETONIDE 40 MG/ML IJ SUSP
40.0000 mg | Freq: Once | INTRAMUSCULAR | Status: AC
  Administered 2018-06-11: 40 mg via INTRAMUSCULAR

## 2018-06-11 MED ORDER — ALBUTEROL SULFATE (2.5 MG/3ML) 0.083% IN NEBU
2.5000 mg | INHALATION_SOLUTION | Freq: Once | RESPIRATORY_TRACT | Status: AC
  Administered 2018-06-11: 2.5 mg via RESPIRATORY_TRACT

## 2018-06-11 MED ORDER — PREDNISONE 10 MG PO TABS: 30.0000 mg | ORAL_TABLET | Freq: Every day | ORAL | 0 refills | Status: AC

## 2018-06-11 MED ORDER — LEVOFLOXACIN 750 MG PO TABS: 750.0000 mg | ORAL_TABLET | Freq: Every day | ORAL | 0 refills | Status: AC

## 2018-06-11 MED ORDER — BUDESONIDE-FORMOTEROL FUMARATE 160-4.5 MCG/ACT IN AERO: 2.0000 | INHALATION_SPRAY | Freq: Two times a day (BID) | RESPIRATORY_TRACT | 3 refills | Status: DC

## 2018-06-11 NOTE — Assessment & Plan Note (Signed)
Acute with no improvement with recent Zpack.  Script for Levofloxacin & Prednisone, is aware this may increase BS and to monitor closely.  CXR ordered, she is aware she needs to go obtain this today. Symbicort & Spiriva samples provided in office + scripts sent.  Pt to report immeditely to ED if sx worsen or fail to improve, discussed that if worsening IV abx treatment may be necessary.  She was able to verbalize back these instructions and agrees to immediately go to ER if worsening.

## 2018-06-11 NOTE — Patient Instructions (Addendum)

## 2018-06-11 NOTE — Telephone Encounter (Signed)
Pt called with complaints of chest pressure that started on 06/05/18; she says that she says that she has not gotten better, and she is having shortness of breath with exertion or coughing; the pt says that she has a runny nose and temp around 100.0; the pt says that she has not been wheezing but she has emphysema; she also complains of a productive cough with thick yellow secretions, body aches, and chills; nurse triage initiated and recommendations made per protocol; the pt says that she is not going to the ED;   the pt normally sees Dr Jeananne Rama but he has no availability; pt offered and accepted appointment with Marnee Guarneri, [facility]. 06/11/2018 at 1300; she verbalized understanding; will route to office for notification.     Reason for Disposition . Wheezing is present  Answer Assessment - Initial Assessment Questions 1. ONSET: "When did the cough begin?"      06/05/2018 2. SEVERITY: "How bad is the cough today?"      sever 3. RESPIRATORY DISTRESS: "Describe your breathing."    Short of breath when coughing 4. FEVER: "Do you have a fever?" If so, ask: "What is your temperature, how was it measured, and when did it start?"     100.0 ogal digital 5. SPUTUM: "Describe the color of your sputum" (clear, white, yellow, green)     Thick yellow 6. HEMOPTYSIS: "Are you coughing up any blood?" If so ask: "How much?" (flecks, streaks, tablespoons, etc.)     no 7. CARDIAC HISTORY: "Do you have any history of heart disease?" (e.g., heart attack, congestive heart failure)      High blood pressure 8. LUNG HISTORY: "Do you have any history of lung disease?"  (e.g., pulmonary embolus, asthma, emphysema)     emphysema 9. PE RISK FACTORS: "Do you have a history of blood clots?" (or: recent major surgery, recent prolonged travel, bedridden)     no 10. OTHER SYMPTOMS: "Do you have any other symptoms?" (e.g., runny nose, wheezing, chest pain)       Short of breath when coughing, headache, body aches  11.  PREGNANCY: "Is there any chance you are pregnant?" "When was your last menstrual period?"       no 12. TRAVEL: "Have you traveled out of the country in the last month?" (e.g., travel history, exposures)       no  Protocols used: [place]

## 2018-06-11 NOTE — Progress Notes (Signed)
BP 125/80   Pulse 70   Temp 98 F (36.7 C) (Oral)   Ht 5\' 7"  (1.702 m)   Wt 235 lb (106.6 kg)   SpO2 91%   BMI 36.81 kg/m    Subjective:    Patient ID: Stacey Gross, female    DOB: 1960-10-21, 58 y.o.   MRN: [id number]  HPI: Stacey Gross is a 58 y.o. female  Chief Complaint  Patient presents with  . Cough    symptoms started about a week ago  . Shortness of Breath  . Headache  . Generalized Body Aches   UPPER RESPIRATORY TRACT INFECTION Pt reports symptoms started last week of "a bad cold". She was ordered a 5 day course of azithromycin, which she reports she completed this morning. She states she has had no improvement of symptoms with antibiotics and has worsening shortness of breath. She reports she is unable to walk very short distances without becoming severely short of breath. Endorses using her albuterol nebulizer 3 times a day and her rescue inhaler up to 10 times per day with no improvement of symptoms.She reports she has been out of Symbicort inhaler for three days. Pt stated she did not wish to go the the hospital for treatment at this time, stating "last time I went they made me wait in waiting room over 3 hours".  Nebulizer performed and kenalog injection given in the office today and CXR ordered. Respiratory symptoms improved post nebulizer and O2 sats increased to 94%. Discussed with patient the severity of symptoms and recommended she go immediately to emergency room if symptoms persist or worsen. Pt was able to verbalize understanding of this.      Worst symptom: "can't breathe" Fever:  yes Cough: yes Shortness of breath: yes Wheezing: yes Chest pain: yes, with cough Chest tightness: yes Chest congestion: yes Nasal congestion: yes Runny nose: yes Post nasal drip: yes Sneezing: no Sore throat: yes Swollen glands: no Sinus pressure: no Headache: yes Face pain: no Toothache: no Ear pain: yes left - pt reports perforated TM "forever" with intermittent  "yellow drainage" Ear pressure: yes left  Eyes red/itching:no Eye drainage/crusting: yes  Vomiting: yes- with cough Rash: no Fatigue: yes Sick contacts: no Strep contacts: no  Context: worse Recurrent sinusitis: no Relief with OTC cold/cough medications: no  Treatments attempted: cough syrup, cough drops, pseudoephedrine, azithromycin   Relevant past medical, surgical, family and social history reviewed and updated as indicated. Interim medical history since our last visit reviewed. Allergies and medications reviewed and updated.  Review of Systems Per HPI unless specifically indicated above     Objective:    BP 125/80   Pulse 70   Temp 98 F (36.7 C) (Oral)   Ht 5\' 7"  (1.702 m)   Wt 235 lb (106.6 kg)   SpO2 91%   BMI 36.81 kg/m   Wt Readings from Last 3 Encounters:  06/11/18 235 lb (106.6 kg)  05/29/18 238 lb (108 kg)  05/25/18 238 lb 9.6 oz (108.2 kg)    Physical Exam Vitals signs and nursing note reviewed.  Constitutional:      Appearance: She is well-developed. She is ill-appearing.  HENT:     Head: Normocephalic.     Right Ear: A middle ear effusion is present. Tympanic membrane is bulging.     Left Ear: Drainage present. Tympanic membrane is injected and perforated.     Nose: Mucosal edema, congestion and rhinorrhea present. Rhinorrhea is clear.  Right Sinus: Maxillary sinus tenderness present. No frontal sinus tenderness.     Left Sinus: Maxillary sinus tenderness present. No frontal sinus tenderness.     Mouth/Throat:     Mouth: Mucous membranes are moist.     Pharynx: Posterior oropharyngeal erythema present. No oropharyngeal exudate.     Comments: Cobblestone appearance to posterior pharynx.  Eyes:     General:        Right eye: Discharge present.        Left eye: Discharge present.    Conjunctiva/sclera:     Right eye: Right conjunctiva is injected.     Left eye: Left conjunctiva is injected.     Pupils: Pupils are equal, round, and reactive  to light.  Neck:     Musculoskeletal: Normal range of motion and neck supple. Normal range of motion.     Thyroid: No thyromegaly.     Vascular: No carotid bruit or JVD.  Cardiovascular:     Rate and Rhythm: Normal rate and regular rhythm.     Pulses: Normal pulses.     Heart sounds: Normal heart sounds, S1 normal and S2 normal.  Pulmonary:     Effort: Tachypnea and accessory muscle usage present.     Breath sounds: Decreased air movement present. Examination of the right-upper field reveals rhonchi. Examination of the left-upper field reveals rhonchi. Decreased breath sounds, wheezing and rhonchi present.     Comments:  Breath sounds and wheezing improved after in office nebulizer treatment.  Abdominal:     General: Bowel sounds are normal.     Palpations: Abdomen is soft.  Lymphadenopathy:     Cervical: Cervical adenopathy present.  Skin:    General: Skin is warm and moist.  Neurological:     Mental Status: She is alert and oriented to person, place, and time.  Psychiatric:        Behavior: Behavior normal. Behavior is cooperative.        Thought Content: Thought content normal.        Judgment: Judgment normal.    CRCL 114.40 calculated based on recent labs  Results for orders placed or performed in visit on 06/11/18  Veritor Flu A/B Waived  Result Value Ref Range   Influenza A Negative Negative   Influenza B Negative Negative  CBC With Differential/Platelet  Result Value Ref Range   WBC 9.3 3.4 - 10.8 x10E3/uL   RBC 4.57 3.77 - 5.28 x10E6/uL   Hemoglobin 14.4 11.1 - 15.9 g/dL   Hematocrit 42.6 34.0 - 46.6 %   MCV 93 79 - 97 fL   MCH 31.5 26.6 - 33.0 pg   MCHC 33.8 31.5 - 35.7 g/dL   RDW 13.1 11.7 - 15.4 %   Platelets 270 150 - 450 x10E3/uL   Neutrophils 65 Not Estab. %   Lymphs 31 Not Estab. %   MID 4 Not Estab. %   Neutrophils Absolute 6.0 1.4 - 7.0 x10E3/uL   Lymphocytes Absolute 2.9 0.7 - 3.1 x10E3/uL   MID (Absolute) 0.4 0.1 - 1.6 X10E3/uL        Assessment & Plan:   Problem List Items Addressed This Visit      Respiratory   COPD with acute exacerbation ([place]) - Primary    Acute with no improvement with recent Zpack.  Script for Levofloxacin & Prednisone, is aware this may increase BS and to monitor closely.  CXR ordered, she is aware she needs to go obtain this today. Symbicort & Spiriva samples  provided in office + scripts sent.  Pt to report immeditely to ED if sx worsen or fail to improve, discussed that if worsening IV abx treatment may be necessary.  She was able to verbalize back these instructions and agrees to immediately go to ER if worsening.      Relevant Medications   triamcinolone acetonide (KENALOG-40) injection 40 mg (Completed)   albuterol (PROVENTIL) (2.5 MG/3ML) 0.083% nebulizer solution 2.5 mg (Completed)   predniSONE (DELTASONE) 10 MG tablet   budesonide-formoterol (SYMBICORT) 160-4.5 MCG/ACT inhaler   Tiotropium Bromide Monohydrate (SPIRIVA RESPIMAT) 2.5 MCG/ACT AERS   Other Relevant Orders   Veritor Flu A/B Waived (Completed)   CBC With Differential/Platelet (Completed)   Basic Metabolic Panel (BMET)   DG Chest 2 View       Follow up plan: Return in about 2 weeks (around 06/25/2018) for lung check.   NOTE WRITTEN BY [employer] DNP STUDENT.  ASSESSMENT AND PLAN OF CARE REVIEWED WITH STUDENT, AGREE WITH ABOVE FINDINGS AND PLAN.

## 2018-06-12 ENCOUNTER — Telehealth: Payer: Self-pay | Admitting: Nurse Practitioner

## 2018-06-12 LAB — BASIC METABOLIC PANEL
BUN/Creatinine Ratio: 11 — ABNORMAL LOW (ref 12–28)
BUN: 10 mg/dL (ref 8–27)
CO2: 25 mmol/L (ref 20–29)
Calcium: 9.4 mg/dL (ref 8.7–10.3)
Chloride: 100 mmol/L (ref 96–106)
Creatinine, Ser: 0.89 mg/dL (ref 0.57–1.00)
GFR calc Af Amer: 81 mL/min/{1.73_m2} (ref >59)
GFR, EST NON AFRICAN AMERICAN: 71 mL/min/{1.73_m2} (ref >59)
Glucose: 91 mg/dL (ref 65–99)
POTASSIUM: 5.4 mmol/L — AB (ref 3.5–5.2)
Sodium: 141 mmol/L (ref 134–144)

## 2018-06-12 NOTE — Telephone Encounter (Signed)
Spoke to patient via telephone about imaging and lab results.  She reports feeling "slightly" better today and via telephone she sounds less hoarse.  Reports decrease in SOB when she is resting.  States some SOB continues if tries to be "really busy".  Recommended to continue to rest and use maintenance inhalers as instructed + continue abx and Prednisone.  To ensure she is taking in adequate fluid, 60 ml and hour.  Discussed with her that her potassium level was slightly elevated.  She endorses she eats "a lot of bananas" daily due to her ongoing leg cramps.  She stated "guess I should hold off on these".  Advised at this time to hold off on bananas for a few days and increase hydration. Will recheck labs at next visit.  She continues to be able to verbalize back that if any worsening in symptoms she is immediately to go to ER, at this time time she reports some improvement and continues to report wishes to not go to ER, but reports she will if any worsening.

## 2018-06-19 ENCOUNTER — Ambulatory Visit (INDEPENDENT_AMBULATORY_CARE_PROVIDER_SITE_OTHER): Payer: Medicare Other | Admitting: Nurse Practitioner

## 2018-06-19 ENCOUNTER — Other Ambulatory Visit: Payer: Self-pay

## 2018-06-19 ENCOUNTER — Encounter: Payer: Self-pay | Admitting: Nurse Practitioner

## 2018-06-19 VITALS — BP 143/82 | HR 79 | Temp 98.5°F | Ht 67.0 in

## 2018-06-19 DIAGNOSIS — B370 Candidal stomatitis: Secondary | ICD-10-CM | POA: Diagnosis not present

## 2018-06-19 DIAGNOSIS — J441 Chronic obstructive pulmonary disease with (acute) exacerbation: Secondary | ICD-10-CM

## 2018-06-19 MED ORDER — NYSTATIN 100000 UNIT/ML MT SUSP: 5.0000 mL | Freq: Four times a day (QID) | OROMUCOSAL | 0 refills | Status: DC

## 2018-06-19 MED ORDER — PREDNISONE 20 MG PO TABS: 40.0000 mg | ORAL_TABLET | Freq: Every day | ORAL | 0 refills | Status: AC

## 2018-06-19 MED ORDER — AMOXICILLIN-POT CLAVULANATE 875-125 MG PO TABS: 1.0000 | ORAL_TABLET | Freq: Two times a day (BID) | ORAL | 0 refills | Status: AC

## 2018-06-19 NOTE — Patient Instructions (Signed)
You were tested for COVID-19 in the office today. This is new virus that can cause serious respiratory symptoms in people, especially with lung disease and other chronic illnesses. You will be notified of the test results. The test takes up to 4 days to result.  Please stay home in "quarantine" away from other people for 4 days. This is very important to help keep the virus from spreading. Please keep track of all visitors that come into your home on the sheet provided. Please follow instructions on the sheet provided.   If you get short of breath and your oxygen drops you need to go to the hospital for further evaluation.

## 2018-06-19 NOTE — Assessment & Plan Note (Addendum)
Acute, minimal improvement with levofloxacin and 5 day prednisone. Script for Augmentin (which worked for previous exacerbation) and 5 day prednisone sent. Continue Spiriva, Symbicort, and Albuterol. Pt to report to ED immediately if sx worsen or fail to improve. COVID-19 testing today- education on testing, self-quarantine (minimum 4 days until test results and 14 days if positive), and visitor documentation provided. Pt was able to verbalize instructions and agrees to go to ER if sx worsen.

## 2018-06-19 NOTE — Assessment & Plan Note (Signed)
Nystatin swish and swallow script sent.

## 2018-06-19 NOTE — Progress Notes (Signed)
BP (!) 143/82 Comment: lower arm  Pulse 79   Temp 98.5 F (36.9 C) (Oral)   Ht 5\' 7"  (1.702 m)   SpO2 96%   BMI 36.81 kg/m    Subjective:    Patient ID: Stacey Gross, female    DOB: Mar 25, 1961, 58 y.o.   MRN: [id number]  HPI: Stacey Gross is a 58 y.o. female  Chief Complaint  Patient presents with  . Lung recheck    f/u  . Generalized Body Aches    for a few days ago   UPPER RESPIRATORY TRACT INFECTION Pt reports cough is mildly improved from last visit (06/11/2018), but feels her shortness of breath has not changed. States cough, congestion, and ShOB are worse at night and first thing in the morning. She endorses worsening body aches "all over" for the past 4-5 days with the worst symptoms felt in upper arms and back. She states, I can't lift arms to get dressed" and  "my right arm feels like something is ripping inside of it and I haven't done nothing". She states,  "My oxygen kept dropping down to 84% and my nephew brought an oxygen tank and I have used that every now and then". She reports only using the oxygen "a few" times and feels it helped her symptoms. She reports completing the course of levofloxacin and prednisone 06/16/2018 and using the Spiriva and Symbicort daily and Albuterol inhaler 6-8 times per day. She also reports pain in her tongue.  Worst symptom: "muscle pain in my body- mostly my arms, but even the back of my legs are hurting".  She continues to report wishes not to go to ER.  She ademantly states this and becomes tearful on discussion of ER visit. Recommend COVID-19 testing today. Discussed the importance of self-quarantine for a minimum of 4 days until results of testing are received. Educated on the importance of limiting visitors, social distancing, and proper hand hygiene. Explained if results for COVID-19 are positive, a quarantine for 14 days will be required. Pt verbalized understanding.    Fever: no Cough: yes Shortness of breath: yes Wheezing: yes  Chest pain: yes, with cough - my ribs feel like they are broke from coughing Chest tightness: yes Chest congestion: yes Nasal congestion: yes Runny nose: yes Post nasal drip: yes Sneezing: no Sore throat: no Swollen glands: no Sinus pressure: no Headache: yes Face pain: no Toothache: no Ear pain: yes bilateral Ear pressure: yes bilateral Eyes red/itching:no Eye drainage/crusting: yes  Vomiting: no Rash: no Fatigue: yes Sick contacts: no Strep contacts: no  Context: better Recurrent sinusitis: no Relief with OTC cold/cough medications: no  Treatments attempted: steroids and antibiotics, Tylenol, icy hot  COPD COPD status: exacerbated- "a little better than last week" Satisfied with current treatment?: yes Oxygen use: yes on and off at 2L Dyspnea frequency: with walking  Cough frequency: constant- worse with movement Rescue inhaler frequency: 6-8 times per day Limitation of activity: yes  Relevant past medical, surgical, family and social history reviewed and updated as indicated. Interim medical history since our last visit reviewed. Allergies and medications reviewed and updated.  Review of Systems Per HPI unless specifically indicated above     Objective:    BP (!) 143/82 Comment: lower arm  Pulse 79   Temp 98.5 F (36.9 C) (Oral)   Ht 5\' 7"  (1.702 m)   SpO2 96%   BMI 36.81 kg/m   Wt Readings from Last 3 Encounters:  06/11/18 235 lb (  106.6 kg)  05/29/18 238 lb (108 kg)  05/25/18 238 lb 9.6 oz (108.2 kg)    Physical Exam Vitals signs and nursing note reviewed.  Constitutional:      Appearance: She is well-developed. She is ill-appearing.  HENT:     Head: Normocephalic.     Right Ear: Hearing normal. A middle ear effusion is present. Tympanic membrane is bulging.     Left Ear: Hearing normal. Drainage present. A middle ear effusion is present. Tympanic membrane is injected and scarred.     Nose: Nasal tenderness, mucosal edema, congestion and  rhinorrhea present.     Right Sinus: No maxillary sinus tenderness or frontal sinus tenderness.     Left Sinus: No maxillary sinus tenderness or frontal sinus tenderness.     Mouth/Throat:     Mouth: Mucous membranes are moist.     Pharynx: Posterior oropharyngeal erythema present.     Comments: Tongue coated white.  Cobblestone appearance to posterior pharynx Eyes:     General:        Right eye: No discharge.        Left eye: No discharge.     Conjunctiva/sclera:     Right eye: Right conjunctiva is injected.     Left eye: Left conjunctiva is injected.     Pupils: Pupils are equal, round, and reactive to light.  Neck:     Musculoskeletal: Normal range of motion and neck supple.     Thyroid: No thyromegaly.     Vascular: No carotid bruit or JVD.  Cardiovascular:     Rate and Rhythm: Normal rate and regular rhythm.     Heart sounds: Normal heart sounds.  Pulmonary:     Effort: Accessory muscle usage present.     Breath sounds: Examination of the right-lower field reveals decreased breath sounds and rales. Examination of the left-lower field reveals decreased breath sounds and rales. Decreased breath sounds and rales present.     Comments: Exhibiting tripod positioning while breathing and talking.  Abdominal:     General: Bowel sounds are normal.     Palpations: Abdomen is soft.  Musculoskeletal:     Right shoulder: She exhibits decreased range of motion, tenderness, pain and decreased strength. She exhibits no swelling, no deformity and normal pulse.     Left shoulder: She exhibits decreased range of motion, tenderness, pain, abnormal pulse and decreased strength. She exhibits no swelling and no deformity.     Right elbow: Normal.She exhibits normal range of motion. No tenderness found.     Left elbow: Normal. She exhibits normal range of motion and no swelling.     Cervical back: She exhibits decreased range of motion, tenderness and pain. She exhibits no edema and no deformity.      Thoracic back: She exhibits decreased range of motion and pain. She exhibits no tenderness.  Lymphadenopathy:     Cervical: Cervical adenopathy present.  Skin:    General: Skin is warm and dry.  Neurological:     Mental Status: She is alert and oriented to person, place, and time.  Psychiatric:        Mood and Affect: Mood normal.        Behavior: Behavior normal.        Thought Content: Thought content normal.        Judgment: Judgment normal.   Of note during exam patient appearance improved somewhat with less tripoding.    Results for orders placed or performed in visit on  06/11/18  Veritor Flu A/B Waived  Result Value Ref Range   Influenza A Negative Negative   Influenza B Negative Negative  CBC With Differential/Platelet  Result Value Ref Range   WBC 9.3 3.4 - 10.8 x10E3/uL   RBC 4.57 3.77 - 5.28 x10E6/uL   Hemoglobin 14.4 11.1 - 15.9 g/dL   Hematocrit 42.6 34.0 - 46.6 %   MCV 93 79 - 97 fL   MCH 31.5 26.6 - 33.0 pg   MCHC 33.8 31.5 - 35.7 g/dL   RDW 13.1 11.7 - 15.4 %   Platelets 270 150 - 450 x10E3/uL   Neutrophils 65 Not Estab. %   Lymphs 31 Not Estab. %   MID 4 Not Estab. %   Neutrophils Absolute 6.0 1.4 - 7.0 x10E3/uL   Lymphocytes Absolute 2.9 0.7 - 3.1 x10E3/uL   MID (Absolute) 0.4 0.1 - 1.6 Q73A1/PF  Basic Metabolic Panel (BMET)  Result Value Ref Range   Glucose 91 65 - 99 mg/dL   BUN 10 8 - 27 mg/dL   Creatinine, Ser 0.89 0.57 - 1.00 mg/dL   GFR calc non Af Amer 71 >59 mL/min/1.73   GFR calc Af Amer 81 >59 mL/min/1.73   BUN/Creatinine Ratio 11 (L) 12 - 28   Sodium 141 134 - 144 mmol/L   Potassium 5.4 (H) 3.5 - 5.2 mmol/L   Chloride 100 96 - 106 mmol/L   CO2 25 20 - 29 mmol/L   Calcium 9.4 8.7 - 10.3 mg/dL      Assessment & Plan:   Problem List Items Addressed This Visit      Respiratory   COPD with acute exacerbation ([facility]) - Primary    Acute, minimal improvement with levofloxacin and 5 day prednisone. Script for Augmentin (which worked for  previous exacerbation) and 5 day prednisone sent. Continue Spiriva, Symbicort, and Albuterol. Pt to report to ED immediately if sx worsen or fail to improve. COVID-19 testing today- education on testing, self-quarantine (minimum 4 days until test results and 14 days if positive), and visitor documentation provided. Pt was able to verbalize instructions and agrees to go to ER if sx worsen.       Relevant Medications   predniSONE (DELTASONE) 20 MG tablet   Other Relevant Orders   Novel Coronavirus, NAA ([facility])     Digestive   Thrush    Nystatin swish and swallow script sent.        Relevant Medications   nystatin (MYCOSTATIN) 100000 UNIT/ML suspension      Time: 25 minutes, >50% spent counseling on COVID 19 testing and precautions  Follow up plan: Return in about 1 week (around 06/26/2018) for URI/COPD .   NOTE WRITTEN BY [employer] DNP STUDENT.  ASSESSMENT AND PLAN OF CARE REVIEWED WITH STUDENT, AGREE WITH ABOVE FINDINGS AND PLAN.

## 2018-06-24 LAB — NOVEL CORONAVIRUS, NAA: SARS-CoV-2, NAA: NOT DETECTED

## 2018-06-25 ENCOUNTER — Telehealth: Payer: Self-pay | Admitting: Nurse Practitioner

## 2018-06-25 NOTE — Telephone Encounter (Signed)
Copied from [place] [id number]. Topic: General - Inquiry >> Jun 25, 2018  4:25 PM Jill Side wrote: Reason for CRM: Appointment Called patient to see about doing virtual appointment for tomorrow 06/26/2018. Patient states that she does not have Internet or smartphone access and would prefer a telephone message. Pt also states that she feels better. Please advise.

## 2018-06-25 NOTE — Telephone Encounter (Signed)
Spoke to patient via telephone and alerted her to negative COVID testing result.

## 2018-06-26 ENCOUNTER — Ambulatory Visit: Payer: Medicare Other | Admitting: Nurse Practitioner

## 2018-06-26 NOTE — Telephone Encounter (Signed)
Called and left patient a VM letting her know that Jolene would still like to see her in office at her 11 am appointment today.

## 2018-06-27 ENCOUNTER — Other Ambulatory Visit: Payer: Self-pay

## 2018-06-27 ENCOUNTER — Ambulatory Visit (INDEPENDENT_AMBULATORY_CARE_PROVIDER_SITE_OTHER): Payer: Medicare Other | Admitting: Nurse Practitioner

## 2018-06-27 ENCOUNTER — Encounter: Payer: Self-pay | Admitting: Nurse Practitioner

## 2018-06-27 VITALS — BP 128/77 | HR 84 | Temp 98.3°F | Ht 67.0 in | Wt 236.0 lb

## 2018-06-27 DIAGNOSIS — B379 Candidiasis, unspecified: Secondary | ICD-10-CM

## 2018-06-27 DIAGNOSIS — E1151 Type 2 diabetes mellitus with diabetic peripheral angiopathy without gangrene: Secondary | ICD-10-CM

## 2018-06-27 DIAGNOSIS — J411 Mucopurulent chronic bronchitis: Secondary | ICD-10-CM

## 2018-06-27 DIAGNOSIS — F17219 Nicotine dependence, cigarettes, with unspecified nicotine-induced disorders: Secondary | ICD-10-CM | POA: Diagnosis not present

## 2018-06-27 LAB — BAYER DCA HB A1C WAIVED: HB A1C (BAYER DCA - WAIVED): 7.2 % — ABNORMAL HIGH (ref <7.0)

## 2018-06-27 MED ORDER — FLUCONAZOLE 150 MG PO TABS: 150.0000 mg | ORAL_TABLET | Freq: Once | ORAL | 0 refills | Status: AC

## 2018-06-27 MED ORDER — SEMAGLUTIDE(0.25 OR 0.5MG/DOS) 2 MG/1.5ML ~~LOC~~ SOPN: 0.5000 mg | PEN_INJECTOR | SUBCUTANEOUS | 5 refills | Status: DC

## 2018-06-27 NOTE — Assessment & Plan Note (Signed)
Chronic, ongoing. A1C today 7.2%. Due to cardiac diagnoses will discontinue Actos and start Ozempic 0.25MG  weekly (first dose provided in office today and education given) with plan to increased to 0.5 MG weekly in 4 weeks. Continue Metformin.  Return in 3 months.  CCM referral.

## 2018-06-27 NOTE — Assessment & Plan Note (Signed)
Chronic, ongoing with improvement in recent exacerbation.  Continue Spiriva, Symbicort, and Albuterol.  CCM referral to pharmacist for further recommendations based on recent frequent exacerbation.  Spirometry at next visit.  Consider switch to Anoro or Trelegy.

## 2018-06-27 NOTE — Progress Notes (Signed)
BP 128/77   Pulse 84   Temp 98.3 F (36.8 C) (Oral)   Ht 5\' 7"  (1.702 m)   Wt 236 lb (107 kg)   SpO2 98%   BMI 36.96 kg/m    Subjective:    Patient ID: Stacey Gross, female    DOB: 09/13/1960, 58 y.o.   MRN: [id number]  HPI: Stacey Gross is a 58 y.o. female  Chief Complaint  Patient presents with  . Lungs recheck   COPD Treated for recent exacerbation, initial treatment with Levofloxacin and Prednisone 06/11/2018, then on recheck 06/19/2018 she continued to have some wheezing and cough -- did second Prednisone burst and Augmentin which worked during previous exacerbation.  CXR from 06/11/2018 noted minimal left basilar atelectasis and no infiltrate.  As maintenance medication she continues on Spiriva and Symbicort with Albuterol as needed.  Reports hot showers make cough worse.  Continues to smoke, but has cut back from 2 packs to less than 1/2.  Reports yeast infection with recent abx use.   COPD status: better Satisfied with current treatment?: yes Oxygen use: no Dyspnea frequency: rarely at this time Cough frequency: throughout the day 4 times Rescue inhaler frequency:  3-4 times a day Limitation of activity: no Productive cough: none at this time Last Spirometry: unknown Pneumovax: Up to Date Influenza: unknown   DIABETES Recently on Prednisone for COPD exacerbation, had one higher BS of 480.  Last A1C in December 2019 === 6.7%.  Currently on Actos and Metformin.  Discussed taking off Actos d/t cardiac (HTN and Aortic atherosclerosis) and trial injectable Ozempic.  Patient is interested in this.  Started first dose in office today with sample provided and educated on how to use and dose regimen.  She agrees to [facility] referral for further recommendations. Hypoglycemic episodes:no Polydipsia/polyuria: no Visual disturbance: no Chest pain: no Paresthesias: no Glucose Monitoring: yes  Accucheck frequency: Daily  Fasting glucose: 120-130  Post prandial:  Evening:  Before  meals: Taking Insulin?: no  Long acting insulin:  Short acting insulin: Blood Pressure Monitoring: not checking Retinal Examination: Not up to Date Foot Exam: up to date Pneumovax: Up to Date Influenza: Up to Date Aspirin: no  Relevant past medical, surgical, family and social history reviewed and updated as indicated. Interim medical history since our last visit reviewed. Allergies and medications reviewed and updated.  Review of Systems  Constitutional: Negative for activity change, appetite change, diaphoresis, fatigue and fever.  Respiratory: Negative for cough, chest tightness and shortness of breath.   Cardiovascular: Negative for chest pain, palpitations and leg swelling.  Gastrointestinal: Negative for abdominal distention, abdominal pain, constipation, diarrhea, nausea and vomiting.  Endocrine: Negative for cold intolerance, heat intolerance, polydipsia, polyphagia and polyuria.  Neurological: Negative for dizziness, syncope, weakness, light-headedness, numbness and headaches.  Psychiatric/Behavioral: Negative.     Per HPI unless specifically indicated above     Objective:    BP 128/77   Pulse 84   Temp 98.3 F (36.8 C) (Oral)   Ht 5\' 7"  (1.702 m)   Wt 236 lb (107 kg)   SpO2 98%   BMI 36.96 kg/m   Wt Readings from Last 3 Encounters:  06/27/18 236 lb (107 kg)  06/11/18 235 lb (106.6 kg)  05/29/18 238 lb (108 kg)    Physical Exam Vitals signs and nursing note reviewed.  Constitutional:      General: She is awake.     Appearance: She is well-developed.  HENT:     Head:  Normocephalic.  Eyes:     General:        Right eye: No discharge.        Left eye: No discharge.     Conjunctiva/sclera: Conjunctivae normal.     Pupils: Pupils are equal, round, and reactive to light.  Neck:     Musculoskeletal: Normal range of motion and neck supple.     Thyroid: No thyromegaly.     Vascular: No carotid bruit or JVD.  Cardiovascular:     Rate and Rhythm: Normal rate  and regular rhythm.     Heart sounds: Normal heart sounds.  Pulmonary:     Effort: Pulmonary effort is normal.     Breath sounds: Normal breath sounds.     Comments: Clear throughout with slightly diminished bases, overall improvement in lung sounds on exam today. Abdominal:     General: Bowel sounds are normal.     Palpations: Abdomen is soft.  Lymphadenopathy:     Cervical: No cervical adenopathy.  Skin:    General: Skin is warm and dry.  Neurological:     Mental Status: She is alert and oriented to person, place, and time.  Psychiatric:        Attention and Perception: Attention normal.        Mood and Affect: Mood normal.        Speech: Speech normal.        Behavior: Behavior normal. Behavior is cooperative.        Thought Content: Thought content normal.        Judgment: Judgment normal.     Results for orders placed or performed in visit on 06/19/18  Novel Coronavirus, NAA ([facility])  Result Value Ref Range   SARS-CoV-2, NAA Not Detected Not Detected      Assessment & Plan:   Problem List Items Addressed This Visit      Cardiovascular and Mediastinum   Type 2 diabetes, controlled, with peripheral circulatory disorder ([place]) - Primary    Chronic, ongoing. A1C today 7.2%. Due to cardiac diagnoses will discontinue Actos and start Ozempic 0.25MG  weekly (first dose provided in office today and education given) with plan to increased to 0.5 MG weekly in 4 weeks. Continue Metformin.  Return in 3 months.  CCM referral.      Relevant Medications   Semaglutide,0.25 or 0.5MG /DOS, (OZEMPIC, 0.25 OR 0.5 MG/DOSE,) 2 MG/1.5ML SOPN   Other Relevant Orders   Bayer DCA Hb A1c Waived   Ambulatory referral to Chronic Care Management Services     Respiratory   COPD (chronic obstructive pulmonary disease) (HCC)    Chronic, ongoing with improvement in recent exacerbation.  Continue Spiriva, Symbicort, and Albuterol.  CCM referral to pharmacist for further recommendations based on recent  frequent exacerbation.  Spirometry at next visit.  Consider switch to Anoro or Trelegy.        Nervous and Auditory   Nicotine dependence, cigarettes, w unsp disorders    With COPD and T2DM === I have recommended complete cessation of tobacco use. I have discussed various options available for assistance with tobacco cessation including over the counter methods (Nicotine gum, patch and lozenges). We also discussed prescription options (Chantix, Nicotine Inhaler / Nasal Spray). The patient is not interested in pursuing any prescription tobacco cessation options at this time.       Other Visit Diagnoses    Yeast infection       Acute, Diflucan script provided.  Advised to hold statin for 72 hours  and then restart.   Relevant Medications   fluconazole (DIFLUCAN) 150 MG tablet       Follow up plan: Return in about 3 months (around 09/27/2018) for T2DM, HTN/HLD.

## 2018-06-27 NOTE — Assessment & Plan Note (Signed)
With COPD and T2DM === I have recommended complete cessation of tobacco use. I have discussed various options available for assistance with tobacco cessation including over the counter methods (Nicotine gum, patch and lozenges). We also discussed prescription options (Chantix, Nicotine Inhaler / Nasal Spray). The patient is not interested in pursuing any prescription tobacco cessation options at this time.

## 2018-06-27 NOTE — Patient Instructions (Signed)
Take Ozempic 0.25 MG once a week x 4 weeks and then increase to 0.5 MG weekly.    Semaglutide injection solution What is this medicine? SEMAGLUTIDE (Sem a GLOO tide) is used to improve blood sugar control in adults with type 2 diabetes. This medicine may be used with other diabetes medicines. This medicine may be used for other purposes; ask your health care provider or pharmacist if you have questions. COMMON BRAND NAME(S): OZEMPIC What should I tell my health care provider before I take this medicine? They need to know if you have any of these conditions: -endocrine tumors (MEN 2) or if someone in your family had these tumors -eye disease, vision problems -history of pancreatitis -kidney disease -stomach problems -thyroid cancer or if someone in your family had thyroid cancer -an unusual or allergic reaction to semaglutide, other medicines, foods, dyes, or preservatives -pregnant or trying to get pregnant -breast-feeding How should I use this medicine? This medicine is for injection under the skin of your upper leg (thigh), stomach area, or upper arm. It is given once every week (every 7 days). You will be taught how to prepare and give this medicine. Use exactly as directed. Take your medicine at regular intervals. Do not take it more often than directed. If you use this medicine with insulin, you should inject this medicine and the insulin separately. Do not mix them together. Do not give the injections right next to each other. Change (rotate) injection sites with each injection. It is important that you put your used needles and syringes in a special sharps container. Do not put them in a trash can. If you do not have a sharps container, call your pharmacist or healthcare provider to get one. A special MedGuide will be given to you by the pharmacist with each prescription and refill. Be sure to read this information carefully each time. Talk to your pediatrician regarding the use of this  medicine in children. Special care may be needed. Overdosage: If you think you have taken too much of this medicine contact a poison control center or emergency room at once. NOTE: This medicine is only for you. Do not share this medicine with others. What if I miss a dose? If you miss a dose, take it as soon as you can within 5 days after the missed dose. Then take your next dose at your regular weekly time. If it has been longer than 5 days after the missed dose, do not take the missed dose. Take the next dose at your regular time. Do not take double or extra doses. If you have questions about a missed dose, contact your health care provider for advice. What may interact with this medicine? -other medicines for diabetes Many medications may cause changes in blood sugar, these include: -alcohol containing beverages -antiviral medicines for HIV or AIDS -aspirin and aspirin-like drugs -certain medicines for blood pressure, heart disease, irregular heart beat -chromium -diuretics -female hormones, such as estrogens or progestins, birth control pills -fenofibrate -gemfibrozil -isoniazid -lanreotide -female hormones or anabolic steroids -MAOIs like Carbex, Eldepryl, Marplan, Nardil, and Parnate -medicines for weight loss -medicines for allergies, asthma, cold, or cough -medicines for depression, anxiety, or psychotic disturbances -niacin -nicotine -NSAIDs, medicines for pain and inflammation, like ibuprofen or naproxen -octreotide -pasireotide -pentamidine -phenytoin -probenecid -quinolone antibiotics such as ciprofloxacin, levofloxacin, ofloxacin -some herbal dietary supplements -steroid medicines such as prednisone or cortisone -sulfamethoxazole; trimethoprim -thyroid hormones Some medications can hide the warning symptoms of low blood sugar (hypoglycemia).  You may need to monitor your blood sugar more closely if you are taking one of these medications. These  include: -beta-blockers, often used for high blood pressure or heart problems (examples include atenolol, metoprolol, propranolol) -clonidine -guanethidine -reserpine This list may not describe all possible interactions. Give your health care provider a list of all the medicines, herbs, non-prescription drugs, or dietary supplements you use. Also tell them if you smoke, drink alcohol, or use illegal drugs. Some items may interact with your medicine. What should I watch for while using this medicine? Visit your doctor or health care professional for regular checks on your progress. Drink plenty of fluids while taking this medicine. Check with your doctor or health care professional if you get an attack of severe diarrhea, nausea, and vomiting. The loss of too much body fluid can make it dangerous for you to take this medicine. A test called the HbA1C (A1C) will be monitored. This is a simple blood test. It measures your blood sugar control over the last 2 to 3 months. You will receive this test every 3 to 6 months. Learn how to check your blood sugar. Learn the symptoms of low and high blood sugar and how to manage them. Always carry a quick-source of sugar with you in case you have symptoms of low blood sugar. Examples include hard sugar candy or glucose tablets. Make sure others know that you can choke if you eat or drink when you develop serious symptoms of low blood sugar, such as seizures or unconsciousness. They must get medical help at once. Tell your doctor or health care professional if you have high blood sugar. You might need to change the dose of your medicine. If you are sick or exercising more than usual, you might need to change the dose of your medicine. Do not skip meals. Ask your doctor or health care professional if you should avoid alcohol. Many nonprescription cough and cold products contain sugar or alcohol. These can affect blood sugar. Pens should never be shared. Even if the  needle is changed, sharing may result in passing of viruses like hepatitis or HIV. Wear a medical ID bracelet or chain, and carry a card that describes your disease and details of your medicine and dosage times. What side effects may I notice from receiving this medicine? Side effects that you should report to your doctor or health care professional as soon as possible: -allergic reactions like skin rash, itching or hives, swelling of the face, lips, or tongue -breathing problems -changes in vision -diarrhea that continues or is severe -lump or swelling on the neck -severe nausea -signs and symptoms of infection like fever or chills; cough; sore throat; pain or trouble passing urine -signs and symptoms of low blood sugar such as feeling anxious, confusion, dizziness, increased hunger, unusually weak or tired, sweating, shakiness, cold, irritable, headache, blurred vision, fast heartbeat, loss of consciousness -signs and symptoms of kidney injury like trouble passing urine or change in the amount of urine -trouble swallowing -unusual stomach upset or pain -vomiting Side effects that usually do not require medical attention (report to your doctor or health care professional if they continue or are bothersome): -constipation -diarrhea -nausea -pain, redness, or irritation at site where injected -stomach upset This list may not describe all possible side effects. Call your doctor for medical advice about side effects. You may report side effects to FDA at 1-800-FDA-1088. Where should I keep my medicine? Keep out of the reach of children. Store unopened  pens in a refrigerator between 2 and 8 degrees C (36 and 46 degrees F). Do not freeze. Protect from light and heat. After you first use the pen, it can be stored for 56 days at room temperature between 15 and 30 degrees C (59 and 86 degrees F) or in a refrigerator. Throw away your used pen after 56 days or after the expiration date, whichever  comes first. Do not store your pen with the needle attached. If the needle is left on, medicine may leak from the pen. NOTE: This sheet is a summary. It may not cover all possible information. If you have questions about this medicine, talk to your doctor, pharmacist, or health care provider.  2019 Elsevier/Gold Standard (2016-04-07 14:43:35)

## 2018-06-29 ENCOUNTER — Other Ambulatory Visit: Payer: Self-pay | Admitting: Nurse Practitioner

## 2018-06-29 ENCOUNTER — Ambulatory Visit (INDEPENDENT_AMBULATORY_CARE_PROVIDER_SITE_OTHER): Payer: Medicare Other | Admitting: Pharmacist

## 2018-06-29 DIAGNOSIS — E1151 Type 2 diabetes mellitus with diabetic peripheral angiopathy without gangrene: Secondary | ICD-10-CM

## 2018-06-29 DIAGNOSIS — F17219 Nicotine dependence, cigarettes, with unspecified nicotine-induced disorders: Secondary | ICD-10-CM

## 2018-06-29 MED ORDER — METFORMIN HCL 500 MG PO TABS: 500.0000 mg | ORAL_TABLET | Freq: Two times a day (BID) | ORAL | 3 refills | Status: DC

## 2018-06-29 MED ORDER — GLUCOSE BLOOD VI STRP: ORAL_STRIP | 12 refills | Status: DC

## 2018-06-29 NOTE — Chronic Care Management (AMB) (Signed)
Chronic Care Management   Note  06/29/2018 Name: Stacey Gross MRN: [id number] DOB: 03-21-1961   Subjective:  Patient is a 58 year old female followed by Golden Pop, MD and Marnee Guarneri, NP for primary care services, referred to chronic care management support with diabetes, COPD, hypertension, tobacco abuse.   Contacted patient telephonically today to discuss chronic care management program.    Stacey Gross was given information about Chronic Care Management services today including:  1. CCM service includes personalized support from designated clinical staff supervised by her physician, including individualized plan of care and coordination with other care providers 2. 24/7 contact phone numbers for assistance for urgent and routine care needs. 3. Service will only be billed when office clinical staff spend 20 minutes or more in a month to coordinate care. 4. Only one practitioner may furnish and bill the service in a calendar month. 5. The patient may stop CCM services at any time (effective at the end of the month) by phone call to the office staff. 6. The patient will be responsible for cost sharing (co-pay) of up to 20% of the service fee (after annual deductible is met).  Patient agreed to services and verbal consent obtained.   Review of patient status, including review of consultants reports, laboratory and other test data, was performed as part of comprehensive evaluation and provision of chronic care management services.   Objective: Lab Results  Component Value Date   CREATININE 0.89 06/11/2018   CREATININE 0.88 05/09/2018   CREATININE 1.06 (H) 08/10/2017    Lab Results  Component Value Date   HGBA1C 7.2 (H) 06/27/2018    Lipid Panel     Component Value Date/Time   CHOL 203 (H) 05/09/2018 1447   CHOL 194 02/22/2017 0847   TRIG 195 (H) 05/09/2018 1447   TRIG 227 (H) 02/22/2017 0847   HDL 39 (L) 05/09/2018 1447   CHOLHDL 5.2 (H) 05/09/2018 1447   VLDL 45  (H) 02/22/2017 0847   LDLCALC 125 (H) 05/09/2018 1447    BP Readings from Last 3 Encounters:  06/27/18 128/77  06/19/18 (!) 143/82  06/11/18 125/80    Allergies  Allergen Reactions  . Doxycycline Hyclate Anaphylaxis, Swelling and Rash  . Latex Rash  . Vancomycin Rash    All mycins    Medications Reviewed Today    Reviewed by De Hollingshead, [facility] (Pharmacist) on 06/29/18 at 1337  Med List Status: <None>  Medication Order Taking? Sig Documenting Provider Last Dose Status Informant  albuterol (PROVENTIL HFA;VENTOLIN HFA) 108 (90 Base) MCG/ACT inhaler [id number] Yes Inhale 2 puffs into the lungs every 6 (six) hours as needed for wheezing or shortness of breath. Guadalupe Maple, MD Taking Active            Med Note Stacey Gross, Arville Lime   Fri Jun 29, 2018  1:30 PM) Using 3-4 times daily  atorvastatin (LIPITOR) 20 MG tablet [id number] Yes Take 1 tablet (20 mg total) by mouth at bedtime. Marnee Guarneri T, NP Taking Active   Blood Glucose Monitoring Suppl (ONE TOUCH ULTRA SYSTEM KIT) W/DEVICE KIT [id number] Yes 1 kit by Does not apply route once.  Patient taking differently:  1 kit by Does not apply route daily.    Guadalupe Maple, MD Taking Active Self  budesonide-formoterol [facility]) 160-4.5 MCG/ACT inhaler [id number] Yes Inhale 2 puffs into the lungs 2 (two) times daily. Venita Lick, NP Taking Active  Med Note Stacey Gross, CATHERINE E   Fri Jun 29, 2018  1:30 PM) Using 2 puffs once daily  clopidogrel (PLAVIX) 75 MG tablet [id number] Yes Take 75 mg by mouth daily. [provider] Taking Active   cyclobenzaprine (FLEXERIL) 10 MG tablet [id number] Yes TAKE ONE TABLET BY MOUTH AT BEDTIME. Guadalupe Maple, MD Taking Active            Med Note Stacey Gross, Arville Lime   Fri Jun 29, 2018  1:32 PM) Using ~1/week  FLUoxetine (PROZAC) 20 MG capsule [id number] Yes Take 1 capsule (20 mg total) by mouth daily. Guadalupe Maple, MD Taking Active   glucose blood test strip  [id number] Yes Use as instructed Guadalupe Maple, MD Taking Active   lisinopril (PRINIVIL,ZESTRIL) 10 MG tablet [id number] Yes Take 1 tablet (10 mg total) by mouth daily. Guadalupe Maple, MD Taking Active   metFORMIN (GLUCOPHAGE) 500 MG tablet [id number] Yes Take 2 tablets (1,000 mg total) by mouth 2 (two) times daily. Guadalupe Maple, MD Taking Active            Med Note Stacey Gross, Arville Lime   Fri Jun 29, 2018  1:34 PM) Taking 500 mg BID  pantoprazole (PROTONIX) 40 MG tablet [id number] Yes Take 1 tablet (40 mg total) by mouth daily. Guadalupe Maple, MD Taking Active   Semaglutide,0.25 or 0.5MG/DOS, (OZEMPIC, 0.25 OR 0.5 MG/DOSE,) 2 MG/1.5ML SOPN [id number] Yes Inject 0.5 mg into the skin once a week. Marnee Guarneri T, NP Taking Active   Tiotropium Bromide Monohydrate (SPIRIVA RESPIMAT) 2.5 MCG/ACT AERS [id number] Yes Inhale 2 puffs into the lungs daily. Marnee Guarneri T, NP Taking Active   traZODone (DESYREL) 50 MG tablet [id number] Yes Take 1 tablet (50 mg total) by mouth at bedtime as needed. for sleep Guadalupe Maple, MD Taking Active   varenicline (CHANTIX CONTINUING MONTH PAK) 1 MG tablet [id number] No Take 1 tablet (1 mg total) by mouth 2 (two) times daily. Start this only after finished initial 7 day start up.  Patient not taking:  Reported on 06/29/2018   Venita Lick, NP Not Taking Active          Assessment:   Goals Addressed            This Visit's Progress     Patient Stated   . "I want to get my A1c down" (pt-stated)       Current Barriers:  . Uncontrolled diabetes: most recent A1c 7.2% on 06/27/2018, increased from 5.8% in 2019.  o Patient reports fasting blood sugars "around 120", but has not started checking 2 hour post prandial because she needs more test strips o Confirms first dose of Ozempic this past week, endorses some stomach upset and diarrhea, but is improving each day o Notes that she is taking metformin 500 mg BID instead of 1000 mg BID, as a higher dose  gave her diarrhea. Has never been on extended release metformin  Pharmacist Clinical Goal(s):  Marland Kitchen Over the next 90 days, patient will work with PharmD to address needs related to improved management of diabetes  Interventions: . Comprehensive medication review performed.  o Agree to continue Ozempic, re-evaluate tolerability in 3-4 weeks o In future, consider switching metformin to XR formulation to improve GI tolerability and potentially maximize metformin dosing. Would avoid increasing metformin while increasing Ozempic to prevent duplicative GI upset. Consider sending updated prescription for metformin 500 mg BID to pharmacy, so that patient does not appear nonadherent to  the current 1000 mg BID dosing on file with her insurance . Extensive counseling on patient specific dietary modifications (increasing protein, monitoring portion sizes of cereals, salad dressings) . Will collaborate with PCP Marnee Guarneri, NP to send updated test strips prescripton to instruct to test up to 3 times daily   Patient Self Care Activities:  . Self administers medications as prescribed  . Patient will continue to check fasting BG, will start checking 2 hour post prandial BG  Initial goal documentation     . "My breathing is bad right now" (pt-stated)       Current Barriers:  . Patient with significant COPD, on ICS/LABA/LAMA therapy and still using albuterol rescue 3-4 times daily o Of note, only using Symbicort once daily. Was unaware that she was to be taking it twice daily. Taking Spiriva once daily . Tobacco abuse, smoking since age 7, up to 2 ppd at most. Currently about 1/2 ppd. Rates importance of quitting as 10 out of 10, but rates her confidence as 4 out of 10. Notes that she is most worried about weight gain with tobacco cessation  Pharmacist Clinical Goal(s):  Marland Kitchen Over the next 30 days, patient will work with PharmD  to address needs related to reduction in tobacco use  . Over the next 30 days,  patient will work with PharmD to improve medication adherence by increasing Symbicort to twice daily dosing  Interventions: . Comprehensive medication review performed.  . Advised patient to starting taking Symbicort twice daily as prescribed, continue Spiriva once daily. Encouraged to evaluate if this helps her breathing . Discussed the appetite suppression effect of Ozempic that was recently started for diabetes, and how this may benefit her concern of increased appetite when cutting back on tobacco use.   Patient Self Care Activities:  . Self administers medications as prescribed  . Patient notes that she plans to pick up a refill of varenicline soon. In pre-contemplative stage of tobacco reduction/cessation  Initial goal documentation        Plan: - PharmD will collaborate with PCP to have updated glucometer test strip prescription sent to [facility] - PharmD will outreach patient in 3 weeks to follow up on blood sugars and COPD control.  Catie Stacey Gross, PharmD Clinical Pharmacist [place] [phone]

## 2018-06-29 NOTE — Progress Notes (Signed)
Metformin dose change, as patient has been taking 500 MG twice a day.  1000 MG caused GI upset.

## 2018-06-29 NOTE — Patient Instructions (Signed)
Visit Information  Goals Addressed            This Visit's Progress     Patient Stated   . "I want to get my A1c down" (pt-stated)       Current Barriers:  . Uncontrolled diabetes: most recent A1c 7.2% on 06/27/2018, increased from 5.8% in 2019.  o Patient reports fasting blood sugars "around 120", but has not started checking 2 hour post prandial because she needs more test strips o Confirms first dose of Ozempic this past week, endorses some stomach upset and diarrhea, but is improving each day o Notes that she is taking metformin 500 mg BID instead of 1000 mg BID, as a higher dose gave her diarrhea. Has never been on extended release metformin  Pharmacist Clinical Goal(s):  Stacey Gross Over the next 90 days, patient will work with PharmD to address needs related to improved management of diabetes  Interventions: . Comprehensive medication review performed.  o Agree to continue Ozempic, re-evaluate tolerability in 3-4 weeks o In future, consider switching metformin to XR formulation to improve GI tolerability and potentially maximize metformin dosing. Would avoid increasing metformin while increasing Ozempic to prevent duplicative GI upset. Consider sending updated prescription for metformin 500 mg BID to pharmacy, so that patient does not appear nonadherent to the current 1000 mg BID dosing on file with her insurance . Extensive counseling on patient specific dietary modifications (increasing protein, monitoring portion sizes of cereals, salad dressings) . Will collaborate with PCP Stacey Guarneri, NP to send updated test strips prescripton to instruct to test up to 3 times daily   Patient Self Care Activities:  . Self administers medications as prescribed  . Patient will continue to check fasting BG, will start checking 2 hour post prandial BG  Initial goal documentation     . "My breathing is bad right now" (pt-stated)       Current Barriers:  . Patient with significant COPD, on  ICS/LABA/LAMA therapy and still using albuterol rescue 3-4 times daily o Of note, only using Symbicort once daily. Was unaware that she was to be taking it twice daily. Taking Spiriva once daily . Tobacco abuse, smoking since age 29, up to 2 ppd at most. Currently about 1/2 ppd. Rates importance of quitting as 10 out of 10, but rates her confidence as 4 out of 10. Notes that she is most worried about weight gain with tobacco cessation  Pharmacist Clinical Goal(s):  Stacey Gross Over the next 30 days, patient will work with PharmD  to address needs related to reduction in tobacco use  . Over the next 30 days, patient will work with PharmD to improve medication adherence by increasing Symbicort to twice daily dosing  Interventions: . Comprehensive medication review performed.  . Advised patient to starting taking Symbicort twice daily as prescribed, continue Spiriva once daily. Encouraged to evaluate if this helps her breathing . Discussed the appetite suppression effect of Ozempic that was recently started for diabetes, and how this may benefit her concern of increased appetite when cutting back on tobacco use.   Patient Self Care Activities:  . Self administers medications as prescribed  . Patient notes that she plans to pick up a refill of varenicline soon. In pre-contemplative stage of tobacco reduction/cessation  Initial goal documentation        Ms. Gross was given information about Chronic Care Management services today including:  1. CCM service includes personalized support from designated clinical staff supervised by her physician, including  individualized plan of care and coordination with other care providers 2. 24/7 contact phone numbers for assistance for urgent and routine care needs. 3. Service will only be billed when office clinical staff spend 20 minutes or more in a month to coordinate care. 4. Only one practitioner may furnish and bill the service in a calendar month. 5. The  patient may stop CCM services at any time (effective at the end of the month) by phone call to the office staff. 6. The patient will be responsible for cost sharing (co-pay) of up to 20% of the service fee (after annual deductible is met).  Patient agreed to services and verbal consent obtained.   The patient verbalized understanding of instructions provided today and declined a print copy of patient instruction materials.    Plan: - PharmD will collaborate with PCP to have updated glucometer test strip prescription sent to [facility] - PharmD will outreach patient in 3 weeks to follow up on blood sugars and COPD control.  Stacey Gross, PharmD Clinical Pharmacist [facility] [phone]

## 2018-07-11 ENCOUNTER — Telehealth: Payer: Self-pay | Admitting: Nurse Practitioner

## 2018-07-11 NOTE — Telephone Encounter (Signed)
Please get patient scheduled.  °

## 2018-07-11 NOTE — Telephone Encounter (Signed)
If virtual access may be able to do virtual visit, if no access then may need in house visit Friday.

## 2018-07-11 NOTE — Telephone Encounter (Signed)
Copied from [facility] [facility] [phone]. Topic: General - Other >> Jul 11, 2018  9:18 AM Celene Kras A wrote: Reason for CRM: Pt called in stating she was still having trouble with her arm. Pt states Dr. Ned Card looked at it during her last visit but she is still experiencing lots of pain. Please advise.

## 2018-07-11 NOTE — Telephone Encounter (Signed)
Sounds good

## 2018-07-11 NOTE — Telephone Encounter (Signed)
Called pt she is unable to do virtual visit, however I put her on afternoon schedule for Friday.

## 2018-07-13 ENCOUNTER — Other Ambulatory Visit: Payer: Self-pay

## 2018-07-13 ENCOUNTER — Encounter: Payer: Self-pay | Admitting: Nurse Practitioner

## 2018-07-13 ENCOUNTER — Ambulatory Visit (INDEPENDENT_AMBULATORY_CARE_PROVIDER_SITE_OTHER): Payer: Medicare Other | Admitting: Nurse Practitioner

## 2018-07-13 VITALS — HR 82 | Temp 97.8°F | Wt 238.0 lb

## 2018-07-13 DIAGNOSIS — M67911 Unspecified disorder of synovium and tendon, right shoulder: Secondary | ICD-10-CM | POA: Insufficient documentation

## 2018-07-13 MED ORDER — PREDNISONE 10 MG PO TABS: ORAL_TABLET | ORAL | 0 refills | Status: DC

## 2018-07-13 NOTE — Assessment & Plan Note (Signed)
Suspect tendinitis vs tear.  Script for Prednisone sent.  Patient refuses imaging at this time, would like ortho referral.  Ortho referral sent.  Recommend continued use of Tylenol as needed, avoid Ibuprofen d/t HTN.  Use Icy/Hot, Biofreeze, and alternate heat/ice as needed.  Gentle stretches only.  She refuses referral to PT at this time, stating she is doing stretches at home.  Return for worsening or continued symptoms.

## 2018-07-13 NOTE — Progress Notes (Signed)
Pulse 82   Temp 97.8 F (36.6 C) (Oral)   Wt 238 lb (108 kg)   SpO2 98%   BMI 37.28 kg/m    Subjective:    Patient ID: Stacey Gross, female    DOB: 06-Jul-1960, 58 y.o.   MRN: [id number]  HPI: Stacey Gross is a 58 y.o. female  Chief Complaint  Patient presents with  . Arm Pain    Bilateral. From shoulder to wrist.    BILATERAL ARM PAIN (R>L): Has been present since recent lengthy upper respiratory illness, during which time she had a constant cough.  Present from her shoulder to her wrists. She questioned whether it can be caused by Lipitor, as she was not taking I consistently.  Was taking 2-3 times a week.  Currently she is taking her Lipitor every day, which she started prior to arm pain.  Right arm is worse than left arm.  States the pain is worse at night, worse to right side and can not lie on side.  She refuses imaging or referral to PT at this time.  Discussed that PT would be beneficial for current rotator cuff discomfort, she would prefer to see ortho provider.  Reports she can not perform every day ADLs with her right arm at this time.  Right arm is her dominant arm.   Duration: weeks Mechanism of injury: unknown Location: bilateral Onset: gradual Severity: 10/10 at worst and a 6/10 at best Quality: dull, pulling and throbbing Frequency: constant Radiation: none Aggravating factors: lifting and movement Alleviating factors: nothing Status: worse Treatments attempted: rest and APAP  Relief with NSAIDs?: No NSAIDs Taken Nighttime pain:  no Paresthesias / decreased sensation:  no Fevers:  no   Relevant past medical, surgical, family and social history reviewed and updated as indicated. Interim medical history since our last visit reviewed. Allergies and medications reviewed and updated.  Review of Systems  Constitutional: Negative for activity change, appetite change, diaphoresis, fatigue and fever.  Respiratory: Negative for cough, chest tightness and  shortness of breath.   Cardiovascular: Negative for chest pain, palpitations and leg swelling.  Gastrointestinal: Negative for abdominal distention, abdominal pain, constipation, diarrhea, nausea and vomiting.  Endocrine: Negative for cold intolerance, heat intolerance, polydipsia, polyphagia and polyuria.  Musculoskeletal: Positive for arthralgias.  Neurological: Negative for dizziness, syncope, weakness, light-headedness, numbness and headaches.  Psychiatric/Behavioral: Negative.     Per HPI unless specifically indicated above     Objective:    Pulse 82   Temp 97.8 F (36.6 C) (Oral)   Wt 238 lb (108 kg)   SpO2 98%   BMI 37.28 kg/m   Wt Readings from Last 3 Encounters:  07/13/18 238 lb (108 kg)  06/27/18 236 lb (107 kg)  06/11/18 235 lb (106.6 kg)    Physical Exam Vitals signs and nursing note reviewed.  Constitutional:      Appearance: She is well-developed.  HENT:     Head: Normocephalic.  Eyes:     General:        Right eye: No discharge.        Left eye: No discharge.     Conjunctiva/sclera: Conjunctivae normal.     Pupils: Pupils are equal, round, and reactive to light.  Neck:     Musculoskeletal: Normal range of motion and neck supple.     Thyroid: No thyromegaly.     Vascular: No carotid bruit or JVD.  Cardiovascular:     Rate and Rhythm: Normal rate and regular  rhythm.     Heart sounds: Normal heart sounds. No murmur. No gallop.   Pulmonary:     Effort: Pulmonary effort is normal.     Breath sounds: Normal breath sounds.  Abdominal:     General: Bowel sounds are normal.     Palpations: Abdomen is soft.  Musculoskeletal:     Right shoulder: She exhibits decreased range of motion, tenderness, pain and decreased strength. She exhibits no swelling and no crepitus.     Left shoulder: She exhibits normal range of motion, no tenderness, no swelling, no crepitus, no deformity, no pain and normal strength.     Right lower leg: No edema.     Left lower leg: No  edema.     Comments: Right side with positive empty can and decreased strength.  Tightness noted to right side neck to shoulder.  Tenderness to palpation right posterior shoulder.  Negative Neer's and Hawkins testing.    Lymphadenopathy:     Cervical: No cervical adenopathy.  Skin:    General: Skin is warm and dry.  Neurological:     Mental Status: She is alert and oriented to person, place, and time.  Psychiatric:        Mood and Affect: Mood normal.        Behavior: Behavior normal.        Thought Content: Thought content normal.        Judgment: Judgment normal.     Results for orders placed or performed in visit on 06/27/18  Bayer DCA Hb A1c Waived  Result Value Ref Range   HB A1C (BAYER DCA - WAIVED) 7.2 (H) <7.0 %      Assessment & Plan:   Problem List Items Addressed This Visit      Musculoskeletal and Integument   Rotator cuff disorder, right - Primary    Suspect tendinitis vs tear.  Script for Prednisone sent.  Patient refuses imaging at this time, would like ortho referral.  Ortho referral sent.  Recommend continued use of Tylenol as needed, avoid Ibuprofen d/t HTN.  Use Icy/Hot, Biofreeze, and alternate heat/ice as needed.  Gentle stretches only.  She refuses referral to PT at this time, stating she is doing stretches at home.  Return for worsening or continued symptoms.        Relevant Orders   Ambulatory referral to Orthopedics       Follow up plan: Return if symptoms worsen or fail to improve.

## 2018-07-13 NOTE — Patient Instructions (Addendum)
Pain Scale Information, Adult A pain scale is a tool to help you describe your pain. A pain scale often uses pictures, numbers, or words. It can help you explain to your health care provider:  What your pain feels like, such as dull, achy, throbbing, or sharp.  Where pain is located in your body.  How often you have pain. Pain scales range from simple to complex. Which pain scale your health care provider uses depends on your condition. Some pain scales only measure pain intensity. These can be useful if the cause of your pain is known. Other pain scales measure more factors, including if you are able to do your usual activities and how the pain is affecting your mood. These scales are useful if you have long-term (chronic) pain. How is a pain scale used? A pain scale may be used in your health care provider's office or in the hospital. Pain scales for adults are usually in the form of a survey. Your health care provider will ask you the questions on the pain scale or have you fill out a form. Your health care provider may also give you a pain scale to use at home. If you have chronic pain, you may use a pain scale for several weeks or months. Keeping a record of your pain symptoms helps your health care provider see how your pain changes over time. Your health care provider can use a pain scale rating to guide your treatment plan. Why is it important to communicate about pain? Being in pain can make you feel unwell and have negative feelings. It can interfere with your daily activities, such as work, school, hobbies, or relationships. Pain can be a sign you have a condition that needs to be treated. A pain scale can help you describe your pain so your health care provider has a better idea of what you are feeling and how to treat your condition. What are some questions to ask my health care provider?  How accurate are the results of this pain scale?  How often should I use a pain scale?  What is  causing my pain?  How long will I need treatment?  What are the risks of treatment with medicines?  What other treatments can help?  What if my pain does not go away with treatment?  Should I keep a record of pain symptoms or pain scale results at home? This information is not intended to replace advice given to you by your health care provider. Make sure you discuss any questions you have with your health care provider. Document Released: 04/05/2015 Document Revised: 08/27/2015 Document Reviewed: 04/05/2015 Elsevier Interactive Patient Education  2019 [employer] Cuff Tendinitis  Rotator cuff tendinitis is inflammation of the tough, cord-like bands that connect muscle to bone (tendons) in the rotator cuff. The rotator cuff includes all of the muscles and tendons that connect the arm to the shoulder. The rotator cuff holds the head of the upper arm bone (humerus) in the cup (fossa) of the shoulder blade (scapula). This condition can lead to a long-lasting (chronic) tear. The tear may be partial or complete. What are the causes? This condition is usually caused by overusing the rotator cuff. What increases the risk? This condition is more likely to develop in athletes and workers who frequently use their shoulder or reach over their heads. This can include activities such as:  Tennis.  Baseball or softball.  Swimming.  Construction work.  Painting. What are the  signs or symptoms? Symptoms of this condition include:  Pain spreading (radiating) from the shoulder to the upper arm.  Swelling and tenderness in front of the shoulder.  Pain when reaching, pulling, or lifting the arm above the head.  Pain when lowering the arm from above the head.  Minor pain in the shoulder when resting.  Increased pain in the shoulder at night.  Difficulty placing the arm behind the back. How is this diagnosed? This condition is diagnosed with a medical history and physical  exam. Tests may also be done, including:  X-rays.  MRI.  Ultrasounds.  CT or MR arthrogram. During this test, a contrast material is injected and then images are taken. How is this treated? Treatment for this condition depends on the severity of the condition. In less severe cases, treatment may include:  Rest. This may be done with a sling that holds the shoulder still (immobilization). Your health care provider may also recommend avoiding activities that involve lifting your arm over your head.  Icing the shoulder.  Anti-inflammatory medicines, such as aspirin or ibuprofen. In more severe cases, treatment may include:  Physical therapy.  Steroid injections.  Surgery. Follow these instructions at home: If you have a sling:  Wear the sling as told by your health care provider. Remove it only as told by your health care provider.  Loosen the sling if your fingers tingle, become numb, or turn cold and blue.  Keep the sling clean.  If the sling is not waterproof, do not let it get wet. Remove it, if allowed, or cover it with a watertight covering when you take a bath or shower. Managing pain, stiffness, and swelling  If directed, put ice on the injured area. ? If you have a removable sling, remove it as told by your health care provider. ? Put ice in a plastic bag. ? Place a towel between your skin and the bag. ? Leave the ice on for 20 minutes, 2-3 times a day.  Move your fingers often to avoid stiffness and to lessen swelling.  Raise (elevate) the injured area above the level of your heart while you are lying down.  Find a comfortable sleeping position or sleep on a recliner, if available. Driving  Do not drive or use heavy machinery while taking prescription pain medicine.  Ask your health care provider when it is safe to drive if you have a sling on your arm. Activity  Rest your shoulder as told by your health care provider.  Return to your normal activities  as told by your health care provider. Ask your health care provider what activities are safe for you.  Do any exercises or stretches as told by your health care provider.  If you do repetitive overhead tasks, take small breaks in between and include stretching exercises as told by your health care provider. General instructions  Do not use any products that contain nicotine or tobacco, such as cigarettes and e-cigarettes. These can delay healing. If you need help quitting, ask your health care provider.  Take over-the-counter and prescription medicines only as told by your health care provider.  Keep all follow-up visits as told by your health care provider. This is important. Contact a health care provider if:  Your pain gets worse.  You have new pain in your arm, hands, or fingers.  Your pain is not relieved with medicine or does not get better after 6 weeks of treatment.  You have cracking sensations when moving your  shoulder in certain directions.  You hear a snapping sound after using your shoulder, followed by severe pain and weakness. Get help right away if:  Your arm, hand, or fingers are numb or tingling.  Your arm, hand, or fingers are swollen or painful or they turn white or blue. Summary  Rotator cuff tendinitis is inflammation of the tough, cord-like bands that connect muscle to bone (tendons) in the rotator cuff.  This condition is usually caused by overusing the rotator cuff, which includes all of the muscles and tendons that connect the arm to the shoulder.  This condition is more likely to develop in athletes and workers who frequently use their shoulder or reach over their heads.  Treatment generally includes rest, anti-inflammatory medicines, and icing. In some cases, physical therapy and steroid injections may be needed. In severe cases, surgery may be needed. This information is not intended to replace advice given to you by your health care provider. Make  sure you discuss any questions you have with your health care provider. Document Released: 06/11/2003 Document Revised: 03/07/2016 Document Reviewed: 03/07/2016 Elsevier Interactive Patient Education  2019 [employer].

## 2018-07-17 ENCOUNTER — Ambulatory Visit (INDEPENDENT_AMBULATORY_CARE_PROVIDER_SITE_OTHER): Payer: Medicare Other | Admitting: Pharmacist

## 2018-07-17 DIAGNOSIS — E1151 Type 2 diabetes mellitus with diabetic peripheral angiopathy without gangrene: Secondary | ICD-10-CM

## 2018-07-17 DIAGNOSIS — F17219 Nicotine dependence, cigarettes, with unspecified nicotine-induced disorders: Secondary | ICD-10-CM

## 2018-07-17 NOTE — Patient Instructions (Signed)
Visit Information  Goals Addressed            This Visit's Progress     Patient Stated   . "I want to get my A1c down" (pt-stated)       Current Barriers:  . Uncontrolled diabetes: most recent A1c 7.2% on 06/27/2018, increased from 5.8% in 2019.  o Notes that she forgets to check BG until after she drinks coffee, and BG around 150; later in the day after taking prednisone, BG >200; however, notes that last time she had a prednisone burst, BG was >400 o Has begun checking 2 hour post-prandial BG since we last spoke, reports sugars 180-190s o Continues metformin 500 mg BID and Ozempic; currently on 0.25 mg daily and notes that she was instructed to finish this pen at that dose (~2-3 doses left), then she is to increase to 0.5 mg daily  Pharmacist Clinical Goal(s):  Marland Kitchen Over the next 90 days, patient will work with PharmD to address needs related to improved management of diabetes  Interventions: . Comprehensive medication review performed.  . Reassured patient that BG elevations with prednisone is a normal side effect, and that BG should return to normal after finishing the taper. Encouraged her to increase Ozempic as instructed, as this higher dose will have a greater impact on lowering BG.  . Educated on eventual goal fasting BG <130 and 2 hour post prandial <180  Patient Self Care Activities:  . Self administers medications as prescribed  . Patient will continue to check fasting BG and 2 hour post prandial BG  Please see past updates related to this goal by clicking on the "Past Updates" button in the selected goal      . "My breathing is bad right now" (pt-stated)       Current Barriers:  . Patient with significant COPD, on ICS/LABA/LAMA therapy and still using albuterol rescue 3-4 times daily o Since last PharmD visit, has started taking Symbicort BID as prescribed. Notes improvement in breathing.  . Tobacco abuse, smoking since age 46, up to 2 ppd at most. Currently about 1/2  ppd. Rates importance of quitting as 10 out of 10, but rates her confidence as 4 out of 10. Notes that she is most worried about weight gain with tobacco cessation. States that she has not picked up Chantix yet, but plans to go by the pharmacy today to pick up prescriptions  Pharmacist Clinical Goal(s):  Marland Kitchen Over the next 30 days, patient will work with PharmD  to address needs related to reduction in tobacco use   Interventions: . Comprehensive medication review performed.  . Encouraged patient to start taking Chanix to help reduce urge to use nicotine products.  . Encouraged patient to continue taking Symbicort BID and Spiriva daily as prescribed.   Patient Self Care Activities:  . Self administers medications as prescribed  . Patient in pre-contemplative stage of tobacco reduction/cessation  Please see past updates related to this goal by clicking on the "Past Updates" button in the selected goal         The patient verbalized understanding of instructions provided today and declined a print copy of patient instruction materials.   Plan:  - CCM PharmD will follow up with patient in 2-3 weeks to discuss diabetes and smoking cessation progress.   [name], PharmD Clinical Pharmacist [facility] [phone]

## 2018-07-17 NOTE — Chronic Care Management (AMB) (Signed)
Chronic Care Management   Follow Up Note   07/17/2018 Name: Stacey Gross MRN: [id number] DOB: 1960-07-06  Referred by: Stacey Guarneri, NP Reason for referral : Chronic Care Management (Diabetes, smoking cessation, COPD)   Stacey Gross is a 58 y.o. year old female who is a primary care patient of Stacey Guarneri, NP and Golden Pop, MD. The CCM team was consulted for assistance with chronic disease management and care coordination needs.    Contacted patient telephonically for follow up of chronic medication conditions and medication management.  Review of patient status, including review of consultants reports, relevant laboratory and other test results, and collaboration with appropriate care team members and the patient's provider was performed as part of comprehensive patient evaluation and provision of chronic care management services.    Goals Addressed            This Visit's Progress     Patient Stated   . "I want to get my A1c down" (pt-stated)       Current Barriers:  . Uncontrolled diabetes: most recent A1c 7.2% on 06/27/2018, increased from 5.8% in 2019.  o Notes that she forgets to check BG until after she drinks coffee, and BG around 150; later in the day after taking prednisone, BG >200; however, notes that last time she had a prednisone burst, BG was >400 o Has begun checking 2 hour post-prandial BG since we last spoke, reports sugars 180-190s o Continues metformin 500 mg BID and Ozempic; currently on 0.25 mg daily and notes that she was instructed to finish this pen at that dose (~2-3 doses left), then she is to increase to 0.5 mg daily  Pharmacist Clinical Goal(s):  Stacey Gross Over the next 90 days, patient will work with PharmD to address needs related to improved management of diabetes  Interventions: . Comprehensive medication review performed.  . Reassured patient that BG elevations with prednisone is a normal side effect, and that BG should return to normal  after finishing the taper. Encouraged her to increase Ozempic as instructed, as this higher dose will have a greater impact on lowering BG.  . Educated on eventual goal fasting BG <130 and 2 hour post prandial <180  Patient Self Care Activities:  . Self administers medications as prescribed  . Patient will continue to check fasting BG and 2 hour post prandial BG  Please see past updates related to this goal by clicking on the "Past Updates" button in the selected goal      . "My breathing is bad right now" (pt-stated)       Current Barriers:  . Patient with significant COPD, on ICS/LABA/LAMA therapy and still using albuterol rescue 3-4 times daily o Since last PharmD visit, has started taking Symbicort BID as prescribed. Notes improvement in breathing.  . Tobacco abuse, smoking since age 9, up to 2 ppd at most. Currently about 1/2 ppd. Rates importance of quitting as 10 out of 10, but rates her confidence as 4 out of 10. Notes that she is most worried about weight gain with tobacco cessation. States that she has not picked up Chantix yet, but plans to go by the pharmacy today to pick up prescriptions  Pharmacist Clinical Goal(s):  Stacey Gross Over the next 30 days, patient will work with PharmD  to address needs related to reduction in tobacco use   Interventions: . Comprehensive medication review performed.  . Encouraged patient to start taking Chanix to help reduce urge to use nicotine products.  Stacey Gross  Encouraged patient to continue taking Symbicort BID and Spiriva daily as prescribed.   Patient Self Care Activities:  . Self administers medications as prescribed  . Patient in pre-contemplative stage of tobacco reduction/cessation  Please see past updates related to this goal by clicking on the "Past Updates" button in the selected goal          Plan:  - CCM PharmD will follow up with patient in 2-3 weeks to discuss diabetes and smoking cessation progress.   Stacey Gross, PharmD Clinical  Pharmacist [place] [phone]

## 2018-07-18 DIAGNOSIS — J449 Chronic obstructive pulmonary disease, unspecified: Secondary | ICD-10-CM | POA: Diagnosis not present

## 2018-07-24 ENCOUNTER — Telehealth: Payer: Self-pay | Admitting: Family Medicine

## 2018-07-24 MED ORDER — MELOXICAM 15 MG PO TABS: 15.0000 mg | ORAL_TABLET | Freq: Every day | ORAL | 3 refills | Status: DC

## 2018-07-24 NOTE — Telephone Encounter (Signed)
Copied from [place] [id number]. Topic: Quick Communication - Rx Refill/Question >> Jul 24, 2018  9:52 AM Alanda Slim E wrote: Medication: Pt called to see if something can be called in for her rotator cuff pain. She is still waiting for ortho to call her for an appt/ please advise

## 2018-07-27 ENCOUNTER — Ambulatory Visit (INDEPENDENT_AMBULATORY_CARE_PROVIDER_SITE_OTHER): Payer: Medicare Other

## 2018-07-27 ENCOUNTER — Ambulatory Visit (INDEPENDENT_AMBULATORY_CARE_PROVIDER_SITE_OTHER): Payer: Medicare Other | Admitting: Nurse Practitioner

## 2018-07-27 ENCOUNTER — Other Ambulatory Visit: Payer: Self-pay

## 2018-07-27 ENCOUNTER — Encounter (INDEPENDENT_AMBULATORY_CARE_PROVIDER_SITE_OTHER): Payer: Self-pay | Admitting: Nurse Practitioner

## 2018-07-27 VITALS — BP 141/80 | HR 85 | Resp 16 | Ht 67.0 in | Wt 244.0 lb

## 2018-07-27 DIAGNOSIS — K219 Gastro-esophageal reflux disease without esophagitis: Secondary | ICD-10-CM

## 2018-07-27 DIAGNOSIS — I75023 Atheroembolism of bilateral lower extremities: Secondary | ICD-10-CM

## 2018-07-27 DIAGNOSIS — J411 Mucopurulent chronic bronchitis: Secondary | ICD-10-CM | POA: Diagnosis not present

## 2018-07-27 DIAGNOSIS — Z79899 Other long term (current) drug therapy: Secondary | ICD-10-CM | POA: Diagnosis not present

## 2018-07-27 DIAGNOSIS — F17219 Nicotine dependence, cigarettes, with unspecified nicotine-induced disorders: Secondary | ICD-10-CM

## 2018-07-27 NOTE — Progress Notes (Signed)
SUBJECTIVE:  Patient ID: Stacey Gross, female    DOB: 1960/08/09, 58 y.o.   MRN: [id number] Chief Complaint  Patient presents with  . Follow-up    67monthabi    HPI  Stacey GUEDESis a 58y.o. female the presents today for follow-up of blue toe syndrome.  Previously she had extreme pain and discomfort in her legs as well as in her toe.  Since her last visit she began taking Plavix.  She states that the Plavix is held greatly.  Her only complaint is with the bruising.  Her toe is still blue from time to time however she is no longer having pain in her lower extremities.  The patient underwent noninvasive studies today which revealed an ABI of 1.06 on her right lower extremity with ABI of 0.98 on her left.  She had triphasic tibial artery waveforms bilaterally.  Past Medical History:  Diagnosis Date  . Asthma   . Cancer ([place]    stomach  . Chronic back pain   . COPD (chronic obstructive pulmonary disease) ([place]   . Degenerative joint disease (DJD) of hip   . Diabetes mellitus without complication ([place]   . Dyspnea   . GERD (gastroesophageal reflux disease)    takes Omeprazole daily  . History of bronchitis yrs ago  . History of colon polyps    benign  . History of staph infection 10 yrs ago  . Hyperlipidemia    takes Atorvastatin daily  . Hypertension   . Peripheral vascular disease ([place]   . Tobacco use    0ne pack of cigaretts daily  . Weakness    numbness and tingling in both legs     Past Surgical History:  Procedure Laterality Date  . ABDOMINAL HYSTERECTOMY     total  . APPENDECTOMY    . BACK SURGERY  9-10 yrs ago  . BREAST BIOPSY Right    Core- neg  . COLONOSCOPY    . COLONOSCOPY WITH PROPOFOL N/A 09/20/2017   Procedure: COLONOSCOPY WITH PROPOFOL;  Surgeon: TVirgel Manifold MD;  Location: [facility] ENDOSCOPY;  Service: Endoscopy;  Laterality: N/A;  . COLONOSCOPY WITH PROPOFOL N/A 04/24/2018   Procedure: COLONOSCOPY WITH PROPOFOL;  Surgeon: WLucilla Lame  MD;  [facility];  Service: Endoscopy;  Laterality: N/A;  . ESOPHAGOGASTRODUODENOSCOPY (EGD) WITH PROPOFOL N/A 09/20/2017   Procedure: ESOPHAGOGASTRODUODENOSCOPY (EGD) WITH PROPOFOL;  Surgeon: TVirgel Manifold MD;  Location: [facility] ENDOSCOPY;  Service: Endoscopy;  Laterality: N/A;  . PERIPHERAL VASCULAR CATHETERIZATION Left 03/17/2016   Procedure: Lower Extremity Angiography;  Surgeon: JAlgernon Huxley MD;  [facility];  Service: Cardiovascular;  Laterality: Left;    Social History   Socioeconomic History  . Marital status: Widowed    Spouse name: Not on file  . Number of children: 0  . Years of education: 8  . Highest education level: Not on file  Occupational History  . Occupation: disabled  Social Needs  . Financial resource strain: Not very hard  . Food insecurity:    Worry: Never true    Inability: Never true  . Transportation needs:    Medical: No    Non-medical: No  Tobacco Use  . Smoking status: Current Every Day Smoker    Packs/day: 0.50    Years: 48.00    Pack years: 24.00    Types: Cigarettes  . Smokeless tobacco: Never Used  Substance and Sexual Activity  . Alcohol use: No  . Drug use:  No  . Sexual activity: Not on file  Lifestyle  . Physical activity:    Days per week: 0 days    Minutes per session: 0 min  . Stress: Patient refused  Relationships  . Social connections:    Talks on phone: More than three times a week    Gets together: More than three times a week    Attends religious service: Never    Active member of club or organization: No    Attends meetings of clubs or organizations: Never    Relationship status: Widowed  . Intimate partner violence:    Fear of current or ex partner: No    Emotionally abused: No    Physically abused: No    Forced sexual activity: No  Other Topics Concern  . Not on file  Social History Narrative  . Not on file    Family History  Problem Relation Age of Onset  . Hyperlipidemia  Mother   . Hyperlipidemia Father   . Melanoma Sister   . Breast cancer Neg Hx     Allergies  Allergen Reactions  . Doxycycline Hyclate Anaphylaxis, Swelling and Rash  . Latex Rash  . Vancomycin Rash    All mycins     Review of Systems   Review of Systems: Negative Unless Checked Constitutional: []Weight loss  []Fever  []Chills Cardiac: []Chest pain   [] Atrial Fibrillation  []Palpitations   []Shortness of breath when laying flat   []Shortness of breath with exertion. []Shortness of breath at rest Vascular:  []Pain in legs with walking   []Pain in legs with standing []Pain in legs when laying flat   []Claudication    []Pain in feet when laying flat    []History of DVT   []Phlebitis   []Swelling in legs   []Varicose veins   []Non-healing ulcers Pulmonary:   []Uses home oxygen   []Productive cough   []Hemoptysis   []Wheeze  [x]COPD   []Asthma Neurologic:  []Dizziness   []Seizures  []Blackouts []History of stroke   []History of TIA  []Aphasia   []Temporary Blindness   []Weakness or numbness in arm   []Weakness or numbness in leg Musculoskeletal:   []Joint swelling   [x]Joint pain   [x]Low back pain  [] History of Knee Replacement []Arthritis []back Surgeries  [] Spinal Stenosis    Hematologic:  []Easy bruising  []Easy bleeding   []Hypercoagulable state   []Anemic Gastrointestinal:  []Diarrhea   []Vomiting  []Gastroesophageal reflux/heartburn   []Difficulty swallowing. []Abdominal pain Genitourinary:  []Chronic kidney disease   []Difficult urination  []Anuric   []Blood in urine []Frequent urination  []Burning with urination   []Hematuria Skin:  []Rashes   []Ulcers []Wounds Psychological:  []History of anxiety   [x] History of major depression  [] Memory Difficulties      OBJECTIVE:   Physical Exam  BP (!) 141/80 (BP Location: Left Arm)   Pulse 85   Resp 16   Ht 5' 7" (1.702 m)   Wt 244 lb (110.7 kg)   BMI 38.22 kg/m   Gen: WD/WN, NAD Head: [place]/AT, No temporalis wasting.   Ear/Nose/Throat: Hearing grossly intact, nares w/o erythema or drainage Eyes: PER, EOMI, sclera nonicteric.  Neck: Supple, no masses.  No JVD.  Pulmonary:  Good air movement, no use of accessory muscles.  Cardiac: RRR Vascular:  Vessel Right Left  Radial Palpable Palpable  Dorsalis Pedis Palpable Palpable  Posterior Tibial Palpable Palpable   Gastrointestinal: soft, non-distended. No guarding/no  peritoneal signs.  Musculoskeletal: M/S 5/5 throughout.  No deformity or atrophy.  Neurologic: Pain and light touch intact in extremities.  Symmetrical.  Speech is fluent. Motor exam as listed above. Psychiatric: Judgment intact, Mood & affect appropriate for pt's clinical situation. Dermatologic: No Venous rashes. No Ulcers Noted.  No changes consistent with cellulitis. Lymph : No Cervical lymphadenopathy, no lichenification or skin changes of chronic lymphedema.       ASSESSMENT AND PLAN:  1. 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  She states that while she sometimes has the discoloration of her toe the pain is rarely there.  It is much more tolerable than it was previously.  The patient has also cut smoking greatly.  I have advised her to continue to take her Plavix, as well as to attempt to quit smoking.  She is also urged to exercise as well.  We will see her back with noninvasive studies in 6 months.  She is also advised to contact us sooner if the pain returns or if the discoloration returns from purple to black or if she develops any lower extremity wounds. - VAS [place] ABI WITH/WO TBI; Future  2. Mucopurulent chronic bronchitis ([place]) Continue pulmonary medications and aerosols as already ordered, these medications have been reviewed and there are no changes at this time.   3. Gastroesophageal reflux disease without esophagitis Continue PPI as already ordered, this medication has been reviewed and there  are no changes at this time.  Avoidence of caffeine and alcohol  Moderate elevation of the head of the bed   4. Nicotine dependence, cigarettes, w unsp disorders Smoking cessation was discussed, 3-10 minutes spent on this topic specifically     Current Outpatient Medications on File Prior to Visit  Medication Sig Dispense Refill  . albuterol (PROVENTIL HFA;VENTOLIN HFA) 108 (90 Base) MCG/ACT inhaler Inhale 2 puffs into the lungs every 6 (six) hours as needed for wheezing or shortness of breath. 1 Inhaler 3  . atorvastatin (LIPITOR) 20 MG tablet Take 1 tablet (20 mg total) by mouth at bedtime. 90 tablet 4  . Blood Glucose Monitoring Suppl (ONE TOUCH ULTRA SYSTEM KIT) W/DEVICE KIT 1 kit by Does not apply route once. (Patient taking differently: 1 kit by Does not apply route daily. ) 100 each 12  . budesonide-formoterol (SYMBICORT) 160-4.5 MCG/ACT inhaler Inhale 2 puffs into the lungs 2 (two) times daily. 1 Inhaler 3  . clopidogrel (PLAVIX) 75 MG tablet Take 75 mg by mouth daily.    . cyclobenzaprine (FLEXERIL) 10 MG tablet TAKE ONE TABLET BY MOUTH AT BEDTIME. 30 tablet 3  . FLUoxetine (PROZAC) 20 MG capsule Take 1 capsule (20 mg total) by mouth daily. 90 capsule 4  . glucose blood test strip Use to check blood sugar three times a day. 200 each 12  . lisinopril (PRINIVIL,ZESTRIL) 10 MG tablet Take 1 tablet (10 mg total) by mouth daily. 90 tablet 4  . meloxicam (MOBIC) 15 MG tablet Take 1 tablet (15 mg total) by mouth daily. 30 tablet 3  . metFORMIN (GLUCOPHAGE) 500 MG tablet Take 1 tablet (500 mg total) by mouth 2 (two) times daily with a meal. 180 tablet 3  . pantoprazole (PROTONIX) 40 MG tablet Take 1 tablet (40 mg total) by mouth daily. 90 tablet 4  . predniSONE (DELTASONE) 10 MG tablet Take 6 tablets by mouth daily for 2 days, then reduce by 1 tablet every  2 days until gone 42 tablet 0  . Semaglutide,0.25 or 0.5MG/DOS, (OZEMPIC, 0.25 OR 0.5 MG/DOSE,) 2 MG/1.5ML SOPN Inject 0.5 mg into the  skin once a week. 3 pen 5  . Tiotropium Bromide Monohydrate (SPIRIVA RESPIMAT) 2.5 MCG/ACT AERS Inhale 2 puffs into the lungs daily. 1 Inhaler 3  . traZODone (DESYREL) 50 MG tablet Take 1 tablet (50 mg total) by mouth at bedtime as needed. for sleep 90 tablet 4  . varenicline (CHANTIX CONTINUING MONTH PAK) 1 MG tablet Take 1 tablet (1 mg total) by mouth 2 (two) times daily. Start this only after finished initial 7 day start up. (Patient not taking: Reported on 07/17/2018) 60 tablet 2   No current facility-administered medications on file prior to visit.     There are no Patient Instructions on file for this visit. Return in about 6 months (around 01/26/2019).   Kris Hartmann, NP  This note was completed with Sales executive.  Any errors are purely unintentional.

## 2018-07-31 DIAGNOSIS — M7541 Impingement syndrome of right shoulder: Secondary | ICD-10-CM | POA: Diagnosis not present

## 2018-08-01 ENCOUNTER — Other Ambulatory Visit: Payer: Self-pay | Admitting: Nurse Practitioner

## 2018-08-01 ENCOUNTER — Telehealth: Payer: Self-pay | Admitting: Family Medicine

## 2018-08-01 MED ORDER — SEMAGLUTIDE(0.25 OR 0.5MG/DOS) 2 MG/1.5ML ~~LOC~~ SOPN: 0.5000 mg | PEN_INJECTOR | SUBCUTANEOUS | 5 refills | Status: DC

## 2018-08-01 NOTE — Progress Notes (Signed)
Refill on Ozempic sent, is at 0.5MG  weekly dosing at this time.

## 2018-08-01 NOTE — Telephone Encounter (Signed)
Refill sent.  She should be taking 0.5MG  weekly, clicking pen to 0.5MG  mark as was shown.  If questions let her know to have pharmacist show exact mark again to click to for weekly dosing.

## 2018-08-01 NOTE — Telephone Encounter (Signed)
Copied from [place] [id number]. Topic: Quick Communication - Rx Refill/Question >> Aug 01, 2018 10:29 AM Alanda Slim E wrote: Medication: Pt was given Ozempic 2.5 sample pack from [facility] and has taken her last one this morning. She needs new Rx sent to pharmacy and it was suppose to be  increased to 5/ please advise   Has the patient contacted their pharmacy? No   Preferred Pharmacy (with phone number or street name): [place], [place] - [place] [phone] (Phone) [phone] (Fax)    Agent: Please be advised that RX refills may take up to 3 business days. We ask that you follow-up with your pharmacy.

## 2018-08-15 ENCOUNTER — Telehealth: Payer: Self-pay

## 2018-08-15 ENCOUNTER — Ambulatory Visit: Payer: Self-pay | Admitting: Pharmacist

## 2018-08-15 DIAGNOSIS — E119 Type 2 diabetes mellitus without complications: Secondary | ICD-10-CM

## 2018-08-15 DIAGNOSIS — F17219 Nicotine dependence, cigarettes, with unspecified nicotine-induced disorders: Secondary | ICD-10-CM

## 2018-08-15 NOTE — Chronic Care Management (AMB) (Signed)
  Chronic Care Management   Note  08/15/2018 Name: [name] MRN: [id number] DOB: 11-23-1960  Stacey Gross is a 58 y.o. year old female who is a primary care patient of Marnee Guarneri, NP and Golden Pop, MD. The CCM team was consulted for assistance with chronic disease management and care coordination needs.    Contacted patient telephonically for follow up of chronic conditions. Left HIPAA compliant message for patient to return my call at her convenience.   Follow up plan: - If I do not hear back, will follow up in 1-2 weeks  [name], PharmD Clinical Pharmacist [place] [phone]

## 2018-08-17 ENCOUNTER — Other Ambulatory Visit: Payer: Self-pay

## 2018-08-17 DIAGNOSIS — J449 Chronic obstructive pulmonary disease, unspecified: Secondary | ICD-10-CM | POA: Diagnosis not present

## 2018-08-17 NOTE — Patient Outreach (Signed)
[facility] [facility]) Care Management  08/17/2018  Stacey Gross May 12, 1960 [id number]   Medication Adherence call to Mrs. [place] Compliant Voice message left with a call back number. [name] is showing past due on Lisinopril 10 mg under [place].   [place] Management Direct Dial [phone]  Fax [phone] [email or web address]

## 2018-08-28 ENCOUNTER — Other Ambulatory Visit: Payer: Self-pay | Admitting: Family Medicine

## 2018-08-28 ENCOUNTER — Ambulatory Visit (INDEPENDENT_AMBULATORY_CARE_PROVIDER_SITE_OTHER): Payer: Medicare Other | Admitting: Pharmacist

## 2018-08-28 DIAGNOSIS — F17219 Nicotine dependence, cigarettes, with unspecified nicotine-induced disorders: Secondary | ICD-10-CM

## 2018-08-28 DIAGNOSIS — E119 Type 2 diabetes mellitus without complications: Secondary | ICD-10-CM | POA: Diagnosis not present

## 2018-08-28 NOTE — Patient Instructions (Signed)
Visit Information  Goals Addressed            This Visit's Progress     Patient Stated   . "I want to get my A1c down" (pt-stated)       Current Barriers:  . Uncontrolled diabetes: most recent A1c 7.2% on 06/27/2018, increased from 5.8% in 2019.  o Takes blood glucose twice daily. In the morning she notes numbers of 111-114. Two hours after dinner she notes 140-144. She has finished her prednisone taper. . Continues metformin 500 mg BID and Ozempic 0.5mg  once weekly.  o Patient denies side effects including stomach upset and nausea   Pharmacist Clinical Goal(s):  Marland Kitchen Over the next 90 days, patient will work with PharmD to address needs related to improved management of diabetes  Interventions: . Comprehensive medication review performed.  . Encouraged her to continue taking medications as prescribed.  . Educated on eventual goal fasting BG <130 and 2 hour post prandial <180 and to write down some of her reading to have at her next visit.  . Celebrated her great numbers  . Anticipate her A1C will be at goal given current numbers. If additional therapy is needed consider increasing Ozempic (max dose 1mg ) as she continues to worry about weight gain with smoking cessation.   Patient Self Care Activities:  . Self administers medications as prescribed  . Patient will continue to check fasting BG and 2 hour post prandial BG and write numbers down   Please see past updates related to this goal by clicking on the "Past Updates" button in the selected goal      . "My breathing is bad right now" (pt-stated)       Current Barriers:  . Patient with significant COPD, on ICS/LABA/LAMA therapy and still using albuterol rescue 3-4 times daily o Since last PharmD visit, continued taking Symbicort BID and Spiriva daily as prescribed. Notes improvement in breathing. o Using albuterol 2 times daily (improved from 3-4 times daily)  . Tobacco abuse, smoking since age 20, up to 2 ppd at most. Currently  about 1/2 ppd. Rates importance of quitting as 10 out of 10, but rates her confidence as 4 out of 10. Notes that she is most worried about weight gain with tobacco cessation. States that she has started Chantix and notes improvement in her cravings. . Reports her main triggers are stress/anger and coffee. She is using peppermint hard candy to help curb triggers.    Pharmacist Clinical Goal(s):  Marland Kitchen Over the next 30 days, patient will work with PharmD  to address needs related to reduction in tobacco use   Interventions: . Comprehensive medication review performed.  . Encouraged patient to continue taking Chanix as prescribed to help reduce urge to use nicotine products.  . Encouraged patient to continue taking Symbicort BID and Spiriva daily as prescribed.  . Encouraged patient to continue to cut back her cigarettes, suggesting 1 cigarette a week and to think about setting an official quit date.  . Encouraged patient to walk to the mail box and back to help fight urges. Also suggested the use of celery or carrot sticks to help fight urges or consider sugar free hard candies.   Patient Self Care Activities:  . Self administers medications as prescribed  . Patient in pre-contemplative stage of tobacco reduction/cessation . Patient will try walking to the mail box and back to fight urges  . Patient will continue decreasing her cigarette consumption and think about having an official  quit date   Please see past updates related to this goal by clicking on the "Past Updates" button in the selected goal      . I have a lot of medcaitons  (pt-stated)       Current Barriers:   Patient states she was prescribed omeprazole (green capsule) and has been taking that scheduled and her pantoprazole as needed  Patient states she has had issues with her pharmacy filling the right things. She received a refill of her pioglitazone which she no longer takes.  She also did not receive a refill of her lisinopril,  and it took a few trips to straighten her refills out  Patient states she may change pharmacies soon.  Pharmacist Clinical Goal(s):  Marland Kitchen Over the next 90 days, patient will work with PharmD to address needs related medication management   Interventions:  Complete medication review conducted   Counseled patient to stop taking the pantoprazole and only take the omeprazole as they do the same thing.   Encouraged patient to call pharmacy and have them discontinue the pioglitazone prescription so she does not end up with medication she does not need.  . Encouraged patient to stay adherent to lisinopril and continue to pick it up regularly.   Patient Self Care Activities:   Patient will take medications as prescribed   Patient will let us know if she chooses to change pharmacies.   Please see past updates related to this goal by clicking on the "Past Updates" button in the selected goal          The patient verbalized understanding of instructions provided today and declined a print copy of patient instruction materials.   Follow up plan: - Will follow up with patient in 3-4 weeks to discuss diabetes management and smoking cessation   [name], PharmD Clinical Pharmacist [place] [phone]

## 2018-08-28 NOTE — Chronic Care Management (AMB) (Signed)
Chronic Care Management   Follow Up Note   08/28/2018 Name: Stacey Gross MRN: [id number] DOB: 04-Aug-1960  Referred by: Guadalupe Maple, MD Reason for referral : No chief complaint on file.   Stacey Gross is a 58 y.o. year old female who is a primary care patient of Crissman, Jeannette How, MD. The CCM team was consulted for assistance with chronic disease management and care coordination needs.    Called patient to follow up on diabetes management and smoking cessation.   Review of patient status, including review of consultants reports, relevant laboratory and other test results, and collaboration with appropriate care team members and the patient's provider was performed as part of comprehensive patient evaluation and provision of chronic care management services.    Goals Addressed            This Visit's Progress     Patient Stated    "I want to get my A1c down" (pt-stated)       Current Barriers:   Uncontrolled diabetes: most recent A1c 7.2% on 06/27/2018, increased from 5.8% in 2019.  o Takes blood glucose twice daily. In the morning she notes numbers of 111-114. Two hours after dinner she notes 140-144. She has finished her prednisone taper.  Continues metformin 500 mg BID and Ozempic 0.5mg  once weekly.  o Patient denies side effects including stomach upset and nausea   Pharmacist Clinical Goal(s):   Over the next 90 days, patient will work with PharmD to address needs related to improved management of diabetes  Interventions:  Comprehensive medication review performed.   Encouraged her to continue taking medications as prescribed.   Educated on eventual goal fasting BG <130 and 2 hour post prandial <180 and to write down some of her reading to have at her next visit.   Celebrated her great numbers   Anticipate her A1C will be at goal given current numbers. If additional therapy is needed consider increasing Ozempic (max dose 1mg ) as she continues to worry about  weight gain with smoking cessation.   Patient Self Care Activities:   Self administers medications as prescribed   Patient will continue to check fasting BG and 2 hour post prandial BG and write numbers down   Please see past updates related to this goal by clicking on the "Past Updates" button in the selected goal       "My breathing is bad right now" (pt-stated)       Current Barriers:   Patient with significant COPD, on ICS/LABA/LAMA therapy and still using albuterol rescue 3-4 times daily o Since last PharmD visit, continued taking Symbicort BID and Spiriva daily as prescribed. Notes improvement in breathing. o Using albuterol 2 times daily (improved from 3-4 times daily)   Tobacco abuse, smoking since age 13, up to 2 ppd at most. Currently about 1/2 ppd. Rates importance of quitting as 10 out of 10, but rates her confidence as 4 out of 10. Notes that she is most worried about weight gain with tobacco cessation. States that she has started Chantix and notes improvement in her cravings.  Reports her main triggers are stress/anger and coffee. She is using peppermint hard candy to help curb triggers.    Pharmacist Clinical Goal(s):   Over the next 30 days, patient will work with PharmD  to address needs related to reduction in tobacco use   Interventions:  Comprehensive medication review performed.   Encouraged patient to continue taking Chanix as prescribed to help reduce  urge to use nicotine products.   Encouraged patient to continue taking Symbicort BID and Spiriva daily as prescribed.   Encouraged patient to continue to cut back her cigarettes, suggesting 1 cigarette a week and to think about setting an official quit date.   Encouraged patient to walk to the mail box and back to help fight urges. Also suggested the use of celery or carrot sticks to help fight urges or consider sugar free hard candies.   Patient Self Care Activities:   Self administers medications as  prescribed   Patient in pre-contemplative stage of tobacco reduction/cessation  Patient will try walking to the mail box and back to fight urges   Patient will continue decreasing her cigarette consumption and think about having an official quit date   Please see past updates related to this goal by clicking on the "Past Updates" button in the selected goal       I have a lot of medcaitons  (pt-stated)       Current Barriers:   Patient states she was prescribed omeprazole (green capsule) and has been taking that scheduled and her pantoprazole as needed  Patient states she has had issues with her pharmacy filling the right things. She received a refill of her pioglitazone which she no longer takes.  She also did not receive a refill of her lisinopril, and it took a few trips to straighten her refills out  Patient states she may change pharmacies soon.  Pharmacist Clinical Goal(s):   Over the next 90 days, patient will work with PharmD to address needs related medication management   Interventions:  Complete medication review conducted   Counseled patient to stop taking the pantoprazole and only take the omeprazole as they do the same thing.   Encouraged patient to call pharmacy and have them discontinue the pioglitazone prescription so she does not end up with medication she does not need.   Encouraged patient to stay adherent to lisinopril and continue to pick it up regularly.   Patient Self Care Activities:   Patient will take medications as prescribed   Patient will let us know if she chooses to change pharmacies.   Please see past updates related to this goal by clicking on the "Past Updates" button in the selected goal          Follow up plan: - Will follow up with patient in 3-4 weeks to discuss diabetes management and smoking cessation   [name], PharmD Clinical Pharmacist [address] [phone]

## 2018-09-10 DIAGNOSIS — M7541 Impingement syndrome of right shoulder: Secondary | ICD-10-CM | POA: Diagnosis not present

## 2018-09-12 DIAGNOSIS — M19011 Primary osteoarthritis, right shoulder: Secondary | ICD-10-CM | POA: Diagnosis not present

## 2018-09-17 DIAGNOSIS — J449 Chronic obstructive pulmonary disease, unspecified: Secondary | ICD-10-CM | POA: Diagnosis not present

## 2018-09-25 ENCOUNTER — Telehealth: Payer: Self-pay

## 2018-09-25 ENCOUNTER — Other Ambulatory Visit (INDEPENDENT_AMBULATORY_CARE_PROVIDER_SITE_OTHER): Payer: Self-pay | Admitting: Vascular Surgery

## 2018-09-25 ENCOUNTER — Ambulatory Visit: Payer: Self-pay | Admitting: Pharmacist

## 2018-09-25 NOTE — Chronic Care Management (AMB) (Addendum)
  Chronic Care Management   Note  09/25/2018 Name: Stacey Gross MRN: [id number] DOB: 07-23-1960  [name] is a 58 y.o. year old female who is a primary care patient of Crissman, Jeannette How, MD. The CCM team was consulted for assistance with chronic disease management and care coordination needs.    Contacted patient to follow up on medication management and smoking cessation progress. Left HIPAA compliant message for patient to return my call at her convenience.   Follow up plan: - If I do not hear back, will outreach again in 2-4 weeks  [name], PharmD Clinical Pharmacist [place] [phone]

## 2018-09-27 ENCOUNTER — Telehealth: Payer: Self-pay | Admitting: Family Medicine

## 2018-09-27 ENCOUNTER — Encounter: Payer: Self-pay | Admitting: Nurse Practitioner

## 2018-09-27 ENCOUNTER — Ambulatory Visit: Payer: Medicare Other | Admitting: Family Medicine

## 2018-09-27 ENCOUNTER — Other Ambulatory Visit: Payer: Self-pay

## 2018-09-27 ENCOUNTER — Ambulatory Visit (INDEPENDENT_AMBULATORY_CARE_PROVIDER_SITE_OTHER): Payer: Medicare Other | Admitting: Nurse Practitioner

## 2018-09-27 VITALS — BP 129/84 | HR 91 | Ht 67.0 in | Wt 235.0 lb

## 2018-09-27 DIAGNOSIS — R1310 Dysphagia, unspecified: Secondary | ICD-10-CM | POA: Diagnosis not present

## 2018-09-27 NOTE — Patient Instructions (Signed)
Dysphagia Eating Plan, Bite Size Food °This diet plan is for people with moderate swallowing problems who have transitioned from pureed and minced foods. Bite size foods are soft and cut into small chunks so that they can be swallowed safely. On this eating plan, you may be instructed to drink liquids that are thickened. °Work with your health care provider and your diet and nutrition specialist (dietitian) to make sure that you are following the diet safely and getting all the nutrients you need. °What are tips for following this plan? °General guidelines for foods ° °· You may eat foods that are tender, soft, and moist. °· Always test food texture before taking a bite. Poke food with a fork or spoon to make sure it is tender. °· Food should be easy to cut and shew. Avoid large pieces of food that require a lot of chewing. °· Take small bites. Each bite should be smaller than your thumb nail (about 15mm by 15 mm). °· If you were on pureed and minced food diet plans, you may eat any of the foods included in those diets. °· Avoid foods that are very dry, hard, sticky, chewy, coarse, or crunchy. °· If instructed by your health care provider, thicken liquids. Follow your health care provider's instructions for what products to use, how to do this, and to what thickness. °? Your health care provider may recommend using a commercial thickener, rice cereal, or potato flakes. Ask your health care provider to recommend thickeners. °? Thickened liquids are usually a “pudding-like” consistency, or they may be as thick as honey or thick enough to eat with a spoon. °Cooking °· To moisten foods, you may add liquids while you are blending, mashing, or grinding your foods to the right consistency. These liquids include gravies, sauces, vegetable or fruit juice, milk, half and half, or water. °· Strain extra liquid from foods before eating. °· Reheat foods slowly to prevent a tough crust from forming. °· Prepare foods in  advance. °Meal planning °· Eat a variety of foods to get all the nutrients you need. °· Some foods may be tolerated better than others. Work with your dietitian to identify which foods are safest for you to eat. °· Follow your meal plan as told by your dietitian. °What foods are allowed? °Grains °Moist breads without nuts or seeds. Biscuits, muffins, pancakes, and waffles that are well-moistened with syrup, jelly, margarine, or butter. Cooked cereals. Moist bread stuffing. Moist rice. Well-moistened cold cereal with small chunks. Well-cooked pasta, noodles, rice, and bread dressing in small pieces and thick sauce. Soft dumplings or spaetzle in small pieces and butter or gravy. °Vegetables °Soft, well-cooked vegetables in small pieces. Soft-cooked, mashed potatoes. Thickened vegetable juice. °Fruits °Canned or cooked fruits that are soft or moist and do not have skin or seeds. Fresh, soft bananas. Thickened fruit juices. °Meat and other protein foods °Tender, moist meats or poultry in small pieces. Moist meatballs or meatloaf. Fish without bones. Eggs or egg substitutes in small pieces. Tofu. Tempeh and meat alternatives in small pieces. Well-cooked, tender beans, peas, baked beans, and other legumes. °Dairy °Thickened milk. Cream cheese. Yogurt. Cottage cheese. Sour cream. Small pieces of soft cheese. °Fats and oils °Butter. Oils. Margarine. Mayonnaise. Gravy. Spreads. °Sweets and desserts °Soft, smooth, moist desserts. Pudding. Custard. Moist cakes. Jam. Jelly. Honey. Preserves. Ask your health care provider whether you can have frozen desserts. °Seasoning and other foods °All seasonings and sweeteners. All sauces with small chunks. Prepared tuna, egg, or chicken   salad without raw fruits or vegetables. Moist casseroles with small, tender pieces of meat. Soups with tender meat. °What foods are not allowed? °Grains °Coarse or dry cereals. Dry breads. Toast. Crackers. Tough, crusty breads, such as French bread and  baguettes. Dry pancakes, waffles, and muffins. Sticky rice. Dry bread stuffing. Granola. Popcorn. Chips. °Vegetables °All raw vegetables. Cooked corn. Rubbery or stiff cooked vegetables. Stringy vegetables, such as celery. Tough, crisp fried potatoes. Potato skins. °Fruits °Hard, crunchy, stringy, high-pulp, and juicy raw fruits such as apples, pineapple, papaya, and watermelon. Small, round fruits, such as grapes. Dried fruit and fruit leather. °Meat and other protein foods °Large pieces of meat. Dry, tough meats, such as bacon, sausage, and hot dogs. Chicken, turkey, or fish with skin and bones. Crunchy peanut butter. Nuts. Seeds. Nut and seed butters. °Dairy °Yogurt with nuts, seeds, or large chunks. Large chunks of cheese. Frozen desserts and milk consistency not allowed by your dietitian. °Sweets and desserts °Dry cakes. Chewy or dry cookies. Any desserts with nuts, seeds, dry fruits, coconut, pineapple, or anything dry, sticky, or hard. Chewy caramel. Licorice. Taffy-type candies. Ask your health care provider whether you can have frozen desserts. °Seasoning and other foods °Soups with tough or large chunks of meats, poultry, or vegetables. Corn or clam chowder. Smoothies with large chunks of fruit. °Summary °· Bite size foods can be helpful for people with moderate swallowing problems. °· On the dysphagia eating plan, you may eat foods that are soft, moist, and cut into pieces smaller than 15mm by 15mm. °· You may be instructed to thicken liquids. Follow your health care provider's instructions about how to do this and to what consistency. °This information is not intended to replace advice given to you by your health care provider. Make sure you discuss any questions you have with your health care provider. °Document Released: 03/21/2005 Document Revised: 07/01/2016 Document Reviewed: 07/01/2016 °Elsevier Interactive Patient Education © 2019 [employer] ° °

## 2018-09-27 NOTE — Assessment & Plan Note (Addendum)
Ongoing x 2 weeks in presence of GERD and smoker.  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  Urgent referral to GI for further evaluation and possible repeat upper endo.  Have recommended increasing Prilosec to 20 MG twice a day, may require higher dose to control symptoms.  Hold Meloxicam.  Will follow-up in 2 weeks with patient for chronic disease visit.  Recommend if worsening symptoms or SOB/CP/stroke-like symptoms present then she is to immediately go to ER.  She was able to verbalize this plan back to provider.

## 2018-09-27 NOTE — Progress Notes (Signed)
BP 129/84 (BP Location: Left Arm, Patient Position: Sitting, Cuff Size: Normal)    Pulse 91    Ht 5\' 7"  (1.702 m)    Wt 235 lb (106.6 kg)    BMI 36.81 kg/m    Subjective:    Patient ID: Stacey Gross, female    DOB: 01-Dec-1960, 58 y.o.   MRN: [id number]  HPI: Stacey Gross is a 58 y.o. female  Chief Complaint  Patient presents with   Trouble Swallowing    Ongoing 1 week. Patient states she'll feel like the food is stuck in her chest. Patient states she'll have like "foam" come up from throat.      This visit was completed via telephone due to the restrictions of the COVID-19 pandemic. All issues as above were discussed and addressed but no physical exam was performed. If it was felt that the patient should be evaluated in the office, they were directed there. The patient verbally consented to this visit. Patient was unable to complete an audio/visual visit due to Lack of equipment. Due to the catastrophic nature of the COVID-19 pandemic, this visit was done through audio contact only.  Location of the patient: home  Location of the provider: home  Those involved with this call:   Provider: Marnee Guarneri, DNP  CMA: Merilyn Baba, [facility]/Registration: Jill Side   Time spent on call: 15 minutes on the phone discussing health concerns. 10 minutes total spent in review of patient's record and preparation of their chart. I verified patient identity using two factors (patient name and date of birth). Patient consents verbally to being seen via telemedicine visit today.   DYSPHAGIA: Is on Omeprazole 20 MG daily.  States she feels like the food is hanging right in her throat and then comes back up.  Reports this has been going on over a week.  This is happening even with fluids at times, has some discomfort right now with drinking Pepsi is having some regurgitation.  Wears dentures, top plate, states it fits okay.  Is a smoker, smoking 6-7 cigarettes a day.  Is taking  Chantix every day.  She denies SOB or CP with these episodes.  No issues with bowels, passing bowels daily without straining.  Denies melena.  Is taking Meloxicam as needed and on daily Plavix.  Had upper endoscopy 09/20/2017 noting non-bleeding erosive gastropathy.  No upper performed this year, had colonoscopy 04/24/2018. Duration: weeks Description of symptom: feeling like things are not going down, but get stuck Onset: Immediately upon swallowing Location of dysphagia: throat Dysphagia to solids only: no Dysphagia to solids & liquids: yes  Frequency:constant  Progressively getting worse: no Alleviatiating factors: nothing Provoking factors: unknown Status: stable EGD: yes, had one in 2019 June Weight loss: no Sensation of lump in throat: yes Heartburn: yes Odynophagia: no Nausea: yes Vomiting: yes Drooling/nasal regurgitation/food spillage: yes Coughing/choking/dysphonia: yes Dysarthria: no Hematemesis: no Regurgitation of undigested food/halitosis: yes Chest pain: no  Relevant past medical, surgical, family and social history reviewed and updated as indicated. Interim medical history since our last visit reviewed. Allergies and medications reviewed and updated.  Review of Systems  Constitutional: Negative for activity change, appetite change, diaphoresis, fatigue and fever.  Respiratory: Negative for cough, chest tightness and shortness of breath.   Cardiovascular: Negative for chest pain, palpitations and leg swelling.  Gastrointestinal: Positive for vomiting (occsionally with regurgitation). Negative for abdominal distention, abdominal pain, constipation, diarrhea and nausea.  Neurological: Negative for dizziness, syncope, facial  asymmetry, speech difficulty, weakness, light-headedness, numbness and headaches.  Psychiatric/Behavioral: Negative.     Per HPI unless specifically indicated above     Objective:    BP 129/84 (BP Location: Left Arm, Patient Position: Sitting,  Cuff Size: Normal)    Pulse 91    Ht 5\' 7"  (1.702 m)    Wt 235 lb (106.6 kg)    BMI 36.81 kg/m   Wt Readings from Last 3 Encounters:  09/27/18 235 lb (106.6 kg)  07/27/18 244 lb (110.7 kg)  07/13/18 238 lb (108 kg)    Physical Exam   Unable to perform, patient with no visual access.  Results for orders placed or performed in visit on 06/27/18  Bayer DCA Hb A1c Waived  Result Value Ref Range   HB A1C (BAYER DCA - WAIVED) 7.2 (H) <7.0 %      Assessment & Plan:   Problem List Items Addressed This Visit      Digestive   Dysphagia - Primary    Ongoing x 2 weeks in presence of GERD and smoker.  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  Urgent referral to GI for further evaluation and possible repeat upper endo.  Have recommended increasing Prilosec to 20 MG twice a day, may require higher dose to control symptoms.  Hold Meloxicam.  Will follow-up in 2 weeks with patient for chronic disease visit.  Recommend if worsening symptoms or SOB/CP/stroke-like symptoms present then she is to immediately go to ER.  She was able to verbalize this plan back to provider.      Relevant Orders   Ambulatory referral to Gastroenterology      I discussed the assessment and treatment plan with the patient. The patient was provided an opportunity to ask questions and all were answered. The patient agreed with the plan and demonstrated an understanding of the instructions.   The patient was advised to call back or seek an in-person evaluation if the symptoms worsen or if the condition fails to improve as anticipated.   I provided 15 minutes of time during this encounter.  Follow up plan: Return in about 2 weeks (around 10/11/2018) for chronic disease visit and dysphagia follow-up.

## 2018-10-01 ENCOUNTER — Other Ambulatory Visit: Payer: Self-pay

## 2018-10-01 ENCOUNTER — Encounter: Payer: Self-pay | Admitting: Gastroenterology

## 2018-10-01 ENCOUNTER — Ambulatory Visit (INDEPENDENT_AMBULATORY_CARE_PROVIDER_SITE_OTHER): Payer: Medicare Other | Admitting: Gastroenterology

## 2018-10-01 VITALS — BP 122/82 | HR 98 | Temp 95.8°F | Ht 67.0 in | Wt 236.0 lb

## 2018-10-01 DIAGNOSIS — R1310 Dysphagia, unspecified: Secondary | ICD-10-CM | POA: Diagnosis not present

## 2018-10-01 NOTE — Patient Instructions (Signed)
You are scheduled for a barium swallow at [facility] on Wednesday, July 1st at 11:00am. Please arrive at the medical mall registration desk at 10:45am. You cannot have anything to eat or drink 3 hours prior to test.  If you need to reschedule this appointment for any reason, please contact central scheduling at [phone].

## 2018-10-01 NOTE — Progress Notes (Signed)
Primary Care Physician: Guadalupe Maple, MD  Primary Gastroenterologist:  Dr. Lucilla Lame  Chief Complaint  Patient presents with  . Dysphagia    HPI: Stacey Gross is a 58 y.o. female here with a report of dysphagia.  The patient had an upper endoscopy and colonoscopy back in June 2019.  This was done by Dr. Bonna Gains. The patient had a poor prep and had a repeat colonoscopy by me in January 2020.  The patient presented to her PCP a few days ago with 2 weeks of symptoms of dysphagia.  She also has had increased reflux symptoms.  The patient had nonerosive gastritis with biopsy showing chronic gastritis without any sign of H. Pylori. The gastritis was reported as mild to moderate. The patient was informed by her PCP to increase her Prilosec to twice a day. The patient reports that she has pressure in her chest and even vomits up small amounts of water when she drinks it.  There is not been any unexplained weight loss.  The patient is on 3 inhalers for her COPD and she states 1 of them is a steroid.  She also reports that she is having a hard time breathing with her and 95 mask on.  Current Outpatient Medications  Medication Sig Dispense Refill  . albuterol (PROVENTIL HFA;VENTOLIN HFA) 108 (90 Base) MCG/ACT inhaler Inhale 2 puffs into the lungs every 6 (six) hours as needed for wheezing or shortness of breath. 1 Inhaler 3  . atorvastatin (LIPITOR) 20 MG tablet Take 1 tablet (20 mg total) by mouth at bedtime. 90 tablet 4  . Blood Glucose Monitoring Suppl (ONE TOUCH ULTRA SYSTEM KIT) W/DEVICE KIT 1 kit by Does not apply route once. (Patient taking differently: 1 kit by Does not apply route daily. ) 100 each 12  . budesonide-formoterol (SYMBICORT) 160-4.5 MCG/ACT inhaler Inhale 2 puffs into the lungs 2 (two) times daily. 1 Inhaler 3  . clopidogrel (PLAVIX) 75 MG tablet TAKE 1 TABLET BY MOUTH DAILY 30 tablet 4  . cyclobenzaprine (FLEXERIL) 10 MG tablet TAKE ONE TABLET BY MOUTH AT BEDTIME 30  tablet 3  . FLUoxetine (PROZAC) 20 MG capsule Take 1 capsule (20 mg total) by mouth daily. 90 capsule 4  . glucose blood test strip Use to check blood sugar three times a day. 200 each 12  . HYDROcodone-acetaminophen (NORCO/VICODIN) 5-325 MG tablet Take 1 tablet by mouth every 8 (eight) hours as needed.    Marland Kitchen lisinopril (PRINIVIL,ZESTRIL) 10 MG tablet Take 1 tablet (10 mg total) by mouth daily. 90 tablet 4  . meloxicam (MOBIC) 15 MG tablet Take 1 tablet (15 mg total) by mouth daily. 30 tablet 3  . metFORMIN (GLUCOPHAGE) 500 MG tablet Take 1 tablet (500 mg total) by mouth 2 (two) times daily with a meal. 180 tablet 3  . omeprazole (PRILOSEC) 20 MG capsule     . Semaglutide,0.25 or 0.5MG/DOS, (OZEMPIC, 0.25 OR 0.5 MG/DOSE,) 2 MG/1.5ML SOPN Inject 0.5 mg into the skin once a week. 3 pen 5  . Tiotropium Bromide Monohydrate (SPIRIVA RESPIMAT) 2.5 MCG/ACT AERS Inhale 2 puffs into the lungs daily. 1 Inhaler 3  . traZODone (DESYREL) 50 MG tablet Take 1 tablet (50 mg total) by mouth at bedtime as needed. for sleep 90 tablet 4  . varenicline (CHANTIX CONTINUING MONTH PAK) 1 MG tablet Take 1 tablet (1 mg total) by mouth 2 (two) times daily. Start this only after finished initial 7 day start up. 60 tablet 2  No current facility-administered medications for this visit.     Allergies as of 10/01/2018 - Review Complete 10/01/2018  Allergen Reaction Noted  . Doxycycline hyclate Anaphylaxis, Swelling, and Rash   . Latex Rash 09/18/2013  . Vancomycin Rash     ROS:  General: Negative for anorexia, weight loss, fever, chills, fatigue, weakness. ENT: Negative for hoarseness, difficulty swallowing , nasal congestion. CV: Negative for chest pain, angina, palpitations, dyspnea on exertion, peripheral edema.  Respiratory: Negative for dyspnea at rest, dyspnea on exertion, cough, sputum, wheezing.  GI: See history of present illness. GU:  Negative for dysuria, hematuria, urinary incontinence, urinary frequency,  nocturnal urination.  Endo: Negative for unusual weight change.    Physical Examination:   BP 122/82   Pulse 98   Temp (!) 95.8 F (35.4 C) (Oral)   Ht 5' 7"  (1.702 m)   Wt 236 lb (107 kg)   BMI 36.96 kg/m   General: Well-nourished, well-developed in no acute distress.  Eyes: No icterus. Conjunctivae pink. Mouth: Oropharyngeal mucosa moist and pink , no lesions erythema or exudate. Lungs: Diffuse rhonchi Heart: Regular rate and rhythm, no murmurs rubs or gallops.  Abdomen: Bowel sounds are normal, nontender, nondistended, no hepatosplenomegaly or masses, no abdominal bruits or hernia , no rebound or guarding.   Extremities: No lower extremity edema. No clubbing or deformities. Neuro: Alert and oriented x 3.  Grossly intact. Skin: Warm and dry, no jaundice.   Psych: Alert and cooperative, normal mood and affect.  Labs:    Imaging Studies: No results found.  Assessment and Plan:   Stacey Gross is a 58 y.o. y/o female who has a report of dysphasia and chest pressure with regurgitation and sometimes vomits even small amounts of liquids that she eats.  The patient had gastritis in the past on her previous EGD.  The patient may have a motility disorder versus candidal esophagitis versus an esophageal stricture.  The patient will be set up for a barium swallow and will also be set up for an EGD. I have discussed risks & benefits which include, but are not limited to, bleeding, infection, perforation & drug reaction.  The patient agrees with this plan & written consent will be obtained.       Lucilla Lame, MD. Marval Regal   Note: This dictation was prepared with Dragon dictation along with smaller phrase technology. Any transcriptional errors that result from this process are unintentional.

## 2018-10-03 ENCOUNTER — Ambulatory Visit
Admission: RE | Admit: 2018-10-03 | Discharge: 2018-10-03 | Disposition: A | Discharge status: Home / Self Care | Payer: Medicare Other | Source: Ambulatory Visit | Attending: Gastroenterology | Admitting: Gastroenterology

## 2018-10-03 ENCOUNTER — Other Ambulatory Visit: Payer: Self-pay

## 2018-10-03 DIAGNOSIS — R1310 Dysphagia, unspecified: Secondary | ICD-10-CM | POA: Diagnosis not present

## 2018-10-03 DIAGNOSIS — K219 Gastro-esophageal reflux disease without esophagitis: Secondary | ICD-10-CM | POA: Diagnosis not present

## 2018-10-04 ENCOUNTER — Telehealth: Payer: Self-pay

## 2018-10-04 NOTE — Telephone Encounter (Signed)
Thank you for this information.  I will discuss the results of the time of her procedure.

## 2018-10-04 NOTE — Telephone Encounter (Signed)
Contacted this pt to schedule a follow up appt to discuss the barium swallow results but she has an EGD on 10/16/18. Would you like to discuss those results before the procedure? Her office appt will not be until the week of July 20th.

## 2018-10-04 NOTE — Telephone Encounter (Signed)
-----   Message from Lucilla Lame, MD sent at 10/04/2018  9:19 AM EDT ----- Please have the patient come in for a follow up.

## 2018-10-08 ENCOUNTER — Other Ambulatory Visit: Payer: Self-pay

## 2018-10-08 ENCOUNTER — Ambulatory Visit (INDEPENDENT_AMBULATORY_CARE_PROVIDER_SITE_OTHER): Payer: Medicare Other | Admitting: Nurse Practitioner

## 2018-10-08 ENCOUNTER — Encounter: Payer: Self-pay | Admitting: Nurse Practitioner

## 2018-10-08 VITALS — BP 110/78 | HR 91 | Temp 98.8°F

## 2018-10-08 DIAGNOSIS — E1151 Type 2 diabetes mellitus with diabetic peripheral angiopathy without gangrene: Secondary | ICD-10-CM | POA: Diagnosis not present

## 2018-10-08 DIAGNOSIS — E119 Type 2 diabetes mellitus without complications: Secondary | ICD-10-CM | POA: Diagnosis not present

## 2018-10-08 DIAGNOSIS — B029 Zoster without complications: Secondary | ICD-10-CM

## 2018-10-08 MED ORDER — LIDOCAINE-PRILOCAINE 2.5-2.5 % EX CREA: 1.0000 "application " | TOPICAL_CREAM | CUTANEOUS | 0 refills | Status: DC | PRN

## 2018-10-08 MED ORDER — VALACYCLOVIR HCL 1 G PO TABS: 1000.0000 mg | ORAL_TABLET | Freq: Three times a day (TID) | ORAL | 0 refills | Status: AC

## 2018-10-08 NOTE — Assessment & Plan Note (Addendum)
Chronic, ongoing.  A1C today 7.0%.  Continue current medication regimen and monitoring BS at home daily.  Return in 3 months.

## 2018-10-08 NOTE — Progress Notes (Signed)
BP 110/78   Pulse 91   Temp 98.8 F (37.1 C) (Oral)   SpO2 95%    Subjective:    Patient ID: Stacey Gross, female    DOB: [birth year], 58 y.o.   MRN: [id number]  HPI: Stacey Gross is a 58 y.o. female  Chief Complaint  Patient presents with  . Rash    pt states she has a burning rash on the left side of her back, on rib area and under left breast. States it started about 2 days ago   RASH Started with rash to left side of back.  Started 2 days ago.  Has had shingles before and she states that is what it looks like, previously to right side.  Has not obtained shingles vaccine due to cost.  Reports that one area did have blisters, but these are now gone. Duration:  days  Location: trunk  Itching: no Burning: yes Redness: yes Oozing: no Scaling: no Blisters: yes Painful: yes Fevers: no Change in detergents/soaps/personal care products: no Recent illness: no Recent travel:no History of same: yes Context: fluctuating Alleviating factors: lotion/moisturizer Treatments attempted:lotion/moisturizer Shortness of breath: no  Throat/tongue swelling: no Myalgias/arthralgias: no   DIABETES Taking Metformin 500 MG BID and Ozempic 0.5 MG weekly.  March A1C 7.2%. Hypoglycemic episodes:no Polydipsia/polyuria: no Visual disturbance: no Chest pain: no Paresthesias: no Glucose Monitoring: yes  Accucheck frequency: Daily  Fasting glucose: <120  Post prandial:  Evening:  Before meals: Taking Insulin?: no  Long acting insulin:  Short acting insulin: Blood Pressure Monitoring: not checking Retinal Examination: Not up to Date Foot Exam: Up to Date Pneumovax: Up to Date Influenza: Up to Date Aspirin: no  Relevant past medical, surgical, family and social history reviewed and updated as indicated. Interim medical history since our last visit reviewed. Allergies and medications reviewed and updated.  Review of Systems  Constitutional: Negative for activity change,  appetite change, diaphoresis, fatigue and fever.  Respiratory: Negative for cough, chest tightness and shortness of breath.   Cardiovascular: Negative for chest pain, palpitations and leg swelling.  Gastrointestinal: Negative for abdominal distention, abdominal pain, constipation, diarrhea, nausea and vomiting.  Endocrine: Negative for cold intolerance, heat intolerance, polydipsia, polyphagia and polyuria.  Skin: Positive for rash.  Neurological: Negative for dizziness, syncope, weakness, light-headedness, numbness and headaches.  Psychiatric/Behavioral: Negative.     Per HPI unless specifically indicated above     Objective:    BP 110/78   Pulse 91   Temp 98.8 F (37.1 C) (Oral)   SpO2 95%   Wt Readings from Last 3 Encounters:  10/01/18 236 lb (107 kg)  09/27/18 235 lb (106.6 kg)  07/27/18 244 lb (110.7 kg)    Physical Exam Vitals signs and nursing note reviewed.  Constitutional:      General: She is awake. She is not in acute distress.    Appearance: She is well-developed. She is not ill-appearing.  HENT:     Head: Normocephalic.     Right Ear: Hearing normal.     Left Ear: Hearing normal.     Nose: Nose normal.     Mouth/Throat:     Mouth: Mucous membranes are moist.  Eyes:     General: Lids are normal.        Right eye: No discharge.        Left eye: No discharge.     Conjunctiva/sclera: Conjunctivae normal.     Pupils: Pupils are equal, round, and reactive to light.  Neck:     Musculoskeletal: Normal range of motion and neck supple.     Thyroid: No thyromegaly.  Cardiovascular:     Rate and Rhythm: Normal rate and regular rhythm.     Heart sounds: Normal heart sounds. No murmur. No gallop.   Pulmonary:     Effort: Pulmonary effort is normal. No accessory muscle usage or respiratory distress.     Breath sounds: Normal breath sounds.  Abdominal:     General: Bowel sounds are normal.     Palpations: Abdomen is soft. There is no hepatomegaly or splenomegaly.   Musculoskeletal:     Right lower leg: No edema.     Left lower leg: No edema.  Skin:    General: Skin is warm and dry.     Findings: Rash present.     Comments: To left side under armpit and top of left breast x 4 clustered, raised areas of erythema.  No vesicles noted at this time (however patient reports there were to area under armpit area).  No drainage, edema, tenderness, warmth.    Neurological:     Mental Status: She is alert and oriented to person, place, and time.  Psychiatric:        Attention and Perception: Attention normal.        Mood and Affect: Mood normal.        Behavior: Behavior normal. Behavior is cooperative.        Thought Content: Thought content normal.        Judgment: Judgment normal.    Diabetic Foot Exam - Simple   Simple Foot Form Visual Inspection See comments: Yes Sensation Testing See comments: Yes Pulse Check See comments: Yes Comments Pulses 1+ DP/PT.  Mild xerosis to skin both feet.  Sensation right 7/10 and left 6/10.     Results for orders placed or performed in visit on 06/27/18  Bayer DCA Hb A1c Waived  Result Value Ref Range   HB A1C (BAYER DCA - WAIVED) 7.2 (H) <7.0 %    Assessment & Plan:   Problem List Items Addressed This Visit      Endocrine   Diabetes ([place]) - Primary    Chronic, ongoing.  A1C today 7.0%.  Continue current medication regimen and monitoring BS at home daily.  Return in 3 months.      Relevant Orders   Bayer DCA Hb A1c Waived   Comprehensive metabolic panel     Other   Shingles rash    Acute x 48 hours.  Valtrex script sent + lidocaine cream for discomfort to areas as needed.  CrCl based on March labs and weight = 113.60.  May take Tylenol as needed for discomfort.  Return for worsening or continued issues.  Plan on shingles vaccine in upcoming months.      Relevant Medications   valACYclovir (VALTREX) 1000 MG tablet       Follow up plan: Return in about 3 months (around 01/08/2019) for T2DM,  HTN/HLD, COPD.

## 2018-10-08 NOTE — Patient Instructions (Signed)
Carbohydrate Counting for Diabetes Mellitus, Adult  Carbohydrate counting is a method of keeping track of how many carbohydrates you eat. Eating carbohydrates naturally increases the amount of sugar (glucose) in the blood. Counting how many carbohydrates you eat helps keep your blood glucose within normal limits, which helps you manage your diabetes (diabetes mellitus). It is important to know how many carbohydrates you can safely have in each meal. This is different for every person. A diet and nutrition specialist (registered dietitian) can help you make a meal plan and calculate how many carbohydrates you should have at each meal and snack. Carbohydrates are found in the following foods:  Grains, such as breads and cereals.  Dried beans and soy products.  Starchy vegetables, such as potatoes, peas, and corn.  Fruit and fruit juices.  Milk and yogurt.  Sweets and snack foods, such as cake, cookies, candy, chips, and soft drinks. How do I count carbohydrates? There are two ways to count carbohydrates in food. You can use either of the methods or a combination of both. Reading "Nutrition Facts" on packaged food The "Nutrition Facts" list is included on the labels of almost all packaged foods and beverages in the [place] It includes:  The serving size.  Information about nutrients in each serving, including the grams (g) of carbohydrate per serving. To use the "Nutrition Facts":  Decide how many servings you will have.  Multiply the number of servings by the number of carbohydrates per serving.  The resulting number is the total amount of carbohydrates that you will be having. Learning standard serving sizes of other foods When you eat carbohydrate foods that are not packaged or do not include "Nutrition Facts" on the label, you need to measure the servings in order to count the amount of carbohydrates:  Measure the foods that you will eat with a food scale or measuring cup, if needed.   Decide how many standard-size servings you will eat.  Multiply the number of servings by 15. Most carbohydrate-rich foods have about 15 g of carbohydrates per serving. ? For example, if you eat 8 oz (170 g) of strawberries, you will have eaten 2 servings and 30 g of carbohydrates (2 servings x 15 g = 30 g).  For foods that have more than one food mixed, such as soups and casseroles, you must count the carbohydrates in each food that is included. The following list contains standard serving sizes of common carbohydrate-rich foods. Each of these servings has about 15 g of carbohydrates:   hamburger bun or  English muffin.   oz (15 mL) syrup.   oz (14 g) jelly.  1 slice of bread.  1 six-inch tortilla.  3 oz (85 g) cooked rice or pasta.  4 oz (113 g) cooked dried beans.  4 oz (113 g) starchy vegetable, such as peas, corn, or potatoes.  4 oz (113 g) hot cereal.  4 oz (113 g) mashed potatoes or  of a large baked potato.  4 oz (113 g) canned or frozen fruit.  4 oz (120 mL) fruit juice.  4-6 crackers.  6 chicken nuggets.  6 oz (170 g) unsweetened dry cereal.  6 oz (170 g) plain fat-free yogurt or yogurt sweetened with artificial sweeteners.  8 oz (240 mL) milk.  8 oz (170 g) fresh fruit or one small piece of fruit.  24 oz (680 g) popped popcorn. Example of carbohydrate counting Sample meal  3 oz (85 g) chicken breast.  6 oz (170 g)   brown rice.  4 oz (113 g) corn.  8 oz (240 mL) milk.  8 oz (170 g) strawberries with sugar-free whipped topping. Carbohydrate calculation 1. Identify the foods that contain carbohydrates: ? Rice. ? Corn. ? Milk. ? Strawberries. 2. Calculate how many servings you have of each food: ? 2 servings rice. ? 1 serving corn. ? 1 serving milk. ? 1 serving strawberries. 3. Multiply each number of servings by 15 g: ? 2 servings rice x 15 g = 30 g. ? 1 serving corn x 15 g = 15 g. ? 1 serving milk x 15 g = 15 g. ? 1 serving  strawberries x 15 g = 15 g. 4. Add together all of the amounts to find the total grams of carbohydrates eaten: ? 30 g + 15 g + 15 g + 15 g = 75 g of carbohydrates total. Summary  Carbohydrate counting is a method of keeping track of how many carbohydrates you eat.  Eating carbohydrates naturally increases the amount of sugar (glucose) in the blood.  Counting how many carbohydrates you eat helps keep your blood glucose within normal limits, which helps you manage your diabetes.  A diet and nutrition specialist (registered dietitian) can help you make a meal plan and calculate how many carbohydrates you should have at each meal and snack. This information is not intended to replace advice given to you by your health care provider. Make sure you discuss any questions you have with your health care provider. Document Released: 03/21/2005 Document Revised: 10/13/2016 Document Reviewed: 09/02/2015 Elsevier Patient Education  2020 Else[employer] Inc.  

## 2018-10-08 NOTE — Assessment & Plan Note (Signed)
Acute x 48 hours.  Valtrex script sent + lidocaine cream for discomfort to areas as needed.  CrCl based on March labs and weight = 113.60.  May take Tylenol as needed for discomfort.  Return for worsening or continued issues.  Plan on shingles vaccine in upcoming months.

## 2018-10-09 LAB — COMPREHENSIVE METABOLIC PANEL
ALT: 30 IU/L (ref 0–32)
AST: 26 IU/L (ref 0–40)
Albumin/Globulin Ratio: 1.8 (ref 1.2–2.2)
Albumin: 4.4 g/dL (ref 3.8–4.9)
Alkaline Phosphatase: 96 IU/L (ref 39–117)
BUN/Creatinine Ratio: 12 (ref 12–28)
BUN: 12 mg/dL (ref 8–27)
Bilirubin Total: 0.2 mg/dL (ref 0.0–1.2)
CO2: 23 mmol/L (ref 20–29)
Calcium: 9.9 mg/dL (ref 8.7–10.3)
Chloride: 101 mmol/L (ref 96–106)
Creatinine, Ser: 0.97 mg/dL (ref 0.57–1.00)
GFR calc Af Amer: 73 mL/min/{1.73_m2} (ref >59)
GFR calc non Af Amer: 64 mL/min/{1.73_m2} (ref >59)
Globulin, Total: 2.4 g/dL (ref 1.5–4.5)
Glucose: 146 mg/dL — ABNORMAL HIGH (ref 65–99)
Potassium: 4.4 mmol/L (ref 3.5–5.2)
Sodium: 140 mmol/L (ref 134–144)
Total Protein: 6.8 g/dL (ref 6.0–8.5)

## 2018-10-09 LAB — BAYER DCA HB A1C WAIVED: HB A1C (BAYER DCA - WAIVED): 7 % — ABNORMAL HIGH (ref <7.0)

## 2018-10-12 ENCOUNTER — Other Ambulatory Visit: Payer: Self-pay

## 2018-10-12 ENCOUNTER — Other Ambulatory Visit
Admission: RE | Admit: 2018-10-12 | Discharge: 2018-10-12 | Disposition: A | Discharge status: Home / Self Care | Payer: Medicare Other | Source: Ambulatory Visit | Attending: Gastroenterology | Admitting: Gastroenterology

## 2018-10-12 DIAGNOSIS — Z1159 Encounter for screening for other viral diseases: Secondary | ICD-10-CM | POA: Insufficient documentation

## 2018-10-12 DIAGNOSIS — Z01812 Encounter for preprocedural laboratory examination: Secondary | ICD-10-CM | POA: Insufficient documentation

## 2018-10-13 LAB — SARS CORONAVIRUS 2 (TAT 6-24 HRS): SARS Coronavirus 2: NEGATIVE

## 2018-10-16 ENCOUNTER — Other Ambulatory Visit: Payer: Self-pay

## 2018-10-16 ENCOUNTER — Encounter
Admission: RE | Disposition: A | Discharge status: Home / Self Care | Payer: Self-pay | Source: Home / Self Care | Attending: Gastroenterology

## 2018-10-16 ENCOUNTER — Encounter: Payer: Self-pay | Admitting: *Deleted

## 2018-10-16 ENCOUNTER — Encounter: Payer: Self-pay | Admitting: Registered Nurse

## 2018-10-16 ENCOUNTER — Ambulatory Visit
Admission: RE | Admit: 2018-10-16 | Discharge: 2018-10-16 | Disposition: A | Discharge status: Home / Self Care | Payer: Medicare Other | Attending: Gastroenterology | Admitting: Gastroenterology

## 2018-10-16 DIAGNOSIS — R1310 Dysphagia, unspecified: Secondary | ICD-10-CM

## 2018-10-16 DIAGNOSIS — Z538 Procedure and treatment not carried out for other reasons: Secondary | ICD-10-CM | POA: Diagnosis not present

## 2018-10-16 DIAGNOSIS — B029 Zoster without complications: Secondary | ICD-10-CM | POA: Insufficient documentation

## 2018-10-16 SURGERY — ESOPHAGOGASTRODUODENOSCOPY (EGD) WITH PROPOFOL
Anesthesia: General

## 2018-10-16 MED ORDER — SODIUM CHLORIDE 0.9 % IV SOLN: INTRAVENOUS | Status: DC

## 2018-10-16 MED ORDER — PROPOFOL 500 MG/50ML IV EMUL
INTRAVENOUS | Status: AC
  Filled 2018-10-16: qty 150

## 2018-10-16 MED ORDER — FENTANYL CITRATE (PF) 100 MCG/2ML IJ SOLN
INTRAMUSCULAR | Status: AC
  Filled 2018-10-16: qty 2

## 2018-10-16 NOTE — OR Nursing (Signed)
Procedure cancelled per anesthesia due to active shingles. Patient instructed to call office to reschedule.

## 2018-10-17 DIAGNOSIS — J449 Chronic obstructive pulmonary disease, unspecified: Secondary | ICD-10-CM | POA: Diagnosis not present

## 2018-10-25 ENCOUNTER — Other Ambulatory Visit: Payer: Self-pay

## 2018-10-25 DIAGNOSIS — R1310 Dysphagia, unspecified: Secondary | ICD-10-CM

## 2018-11-05 ENCOUNTER — Encounter: Payer: Self-pay | Admitting: Pharmacist

## 2018-11-05 ENCOUNTER — Ambulatory Visit (INDEPENDENT_AMBULATORY_CARE_PROVIDER_SITE_OTHER): Payer: Medicare Other | Admitting: Pharmacist

## 2018-11-05 DIAGNOSIS — E119 Type 2 diabetes mellitus without complications: Secondary | ICD-10-CM

## 2018-11-05 DIAGNOSIS — F17219 Nicotine dependence, cigarettes, with unspecified nicotine-induced disorders: Secondary | ICD-10-CM

## 2018-11-05 NOTE — Chronic Care Management (AMB) (Signed)
Chronic Care Management   Follow Up Note   11/05/2018 Name: Stacey Gross MRN: [id number] DOB: Feb 16, 1961  Referred by: Stacey Maple, MD Reason for referral : Chronic Care Management (Medication Management)   Stacey Gross is a 58 y.o. year old female who is a primary care patient of Stacey, Jeannette How, MD. The CCM team was consulted for assistance with chronic disease management and care coordination needs.    Contacted patient for continued medication management support. She notes that her shingles rash/pain is improving, and almost completely resolved. Reports the EGD later this month.   Review of patient status, including review of consultants reports, relevant laboratory and other test results, and collaboration with appropriate care team members and the patient's provider was performed as part of comprehensive patient evaluation and provision of chronic care management services.    Goals Addressed            This Visit's Progress     Patient Stated   . "I want to get my A1c down" (pt-stated)       Current Barriers:  . Diabetes: uncontrolled but improved, most recent A1c 7.0% o Patient pleased with the improvement in her blood sugar with Ozempic . Current antihyperglycemic regimen: metformin 500 mg BID, Ozempic 0.5 mg once weekly . Current blood glucose readings: Fasting <130, post prandial <180 generally . Cardiovascular risk reduction: o Current hypertensive regimen: lisinopril 10 mg daily, BP generally well controlled <130/80 at recent appointments o Current hyperlipidemia regimen: atorvastatin 20 mg; LDL NOT at goal on last check, was 125  Pharmacist Clinical Goal(s):  Marland Kitchen Over the next 90 days, patient with work with PharmD and primary care provider to address optimized medication management  Interventions: . Congratulated patient on improvement in A1c. Discussed that a higher dose of Ozempic can be used in the future if needed.  . Consider increasing atorvastatin  from 20 mg to 40 mg to target LDL of at least <100. Reviewed refill hx, patient appears to be adherent to current regimen  Patient Self Care Activities:  . Patient will check blood glucose regularly , document, and provide at future appointments . Patient will take medications as prescribed . Patient will report any questions or concerns to provider   Please see past updates related to this goal by clicking on the "Past Updates" button in the selected goal      . "My breathing is bad right now" (pt-stated)       Current Barriers:  . Patient with significant COPD, on ICS/LABA/LAMA therapy and still using albuterol rescue 3-4 times daily . Tobacco abuse, smoking since age 53, up to 2 ppd at most. Currently about 1/2 ppd. Notes that Chantix has helped reduce cravings, and she continues to make gradual improvements in reducing tobacco intake  Pharmacist Clinical Goal(s):  Marland Kitchen Over the next 90 days, patient will work with PharmD  to address needs related to reduction in tobacco use    Interventions: . Encouraged continued use of Chantix, and continued focus on cutting back on tobacco use . Encouraged continued adherence to inhalers, especially during humid seasons.   Patient Self Care Activities:  . Self administers medications as prescribed   Please see past updates related to this goal by clicking on the "Past Updates" button in the selected goal      . I have a lot of medications (pt-stated)       Current Barriers:   Patient states she was prescribed omeprazole (green capsule)  and has been taking that scheduled and her pantoprazole as needed  Patient states she has had issues with her pharmacy filling the right things. She received a refill of her pioglitazone which she no longer takes.  She also did not receive a refill of her lisinopril, and it took a few trips to straighten her refills out  Patient states she may change pharmacies soon.  Pharmacist Clinical Goal(s):  Marland Kitchen Over the  next 90 days, patient will work with PharmD to address needs related medication management   Interventions:  Complete medication review conducted   Counseled patient to stop taking the pantoprazole and only take the omeprazole as they do the same thing.   Encouraged patient to call pharmacy and have them discontinue the pioglitazone prescription so she does not end up with medication she does not need.  . Encouraged patient to stay adherent to lisinopril and continue to pick it up regularly.   Patient Self Care Activities:   Patient will take medications as prescribed   Patient will let us know if she chooses to change pharmacies.   Please see past updates related to this goal by clicking on the "Past Updates" button in the selected goal          Plan:  - Will follow up with patient in 4-5 weeks for continued medication management support  Stacey Gross, PharmD Clinical Pharmacist [place] [phone]

## 2018-11-05 NOTE — Patient Instructions (Signed)
Visit Information  Goals Addressed            This Visit's Progress     Patient Stated   . "I want to get my A1c down" (pt-stated)       Current Barriers:  . Diabetes: uncontrolled but improved, most recent A1c 7.0% o Patient pleased with the improvement in her blood sugar with Ozempic . Current antihyperglycemic regimen: metformin 500 mg BID, Ozempic 0.5 mg once weekly . Current blood glucose readings: Fasting <130, post prandial <180 generally . Cardiovascular risk reduction: o Current hypertensive regimen: lisinopril 10 mg daily, BP generally well controlled <130/80 at recent appointments o Current hyperlipidemia regimen: atorvastatin 20 mg; LDL NOT at goal on last check, was 125  Pharmacist Clinical Goal(s):  Marland Kitchen Over the next 90 days, patient with work with PharmD and primary care provider to address optimized medication management  Interventions: . Congratulated patient on improvement in A1c. Discussed that a higher dose of Ozempic can be used in the future if needed.  . Consider increasing atorvastatin from 20 mg to 40 mg to target LDL of at least <100. Reviewed refill hx, patient appears to be adherent to current regimen  Patient Self Care Activities:  . Patient will check blood glucose regularly , document, and provide at future appointments . Patient will take medications as prescribed . Patient will report any questions or concerns to provider   Please see past updates related to this goal by clicking on the "Past Updates" button in the selected goal      . "My breathing is bad right now" (pt-stated)       Current Barriers:  . Patient with significant COPD, on ICS/LABA/LAMA therapy and still using albuterol rescue 3-4 times daily . Tobacco abuse, smoking since age 69, up to 2 ppd at most. Currently about 1/2 ppd. Notes that Chantix has helped reduce cravings, and she continues to make gradual improvements in reducing tobacco intake  Pharmacist Clinical Goal(s):   Marland Kitchen Over the next 90 days, patient will work with PharmD  to address needs related to reduction in tobacco use    Interventions: . Encouraged continued use of Chantix, and continued focus on cutting back on tobacco use . Encouraged continued adherence to inhalers, especially during humid seasons.   Patient Self Care Activities:  . Self administers medications as prescribed   Please see past updates related to this goal by clicking on the "Past Updates" button in the selected goal      . I have a lot of medications (pt-stated)       Current Barriers:   Patient states she was prescribed omeprazole (green capsule) and has been taking that scheduled and her pantoprazole as needed  Patient states she has had issues with her pharmacy filling the right things. She received a refill of her pioglitazone which she no longer takes.  She also did not receive a refill of her lisinopril, and it took a few trips to straighten her refills out  Patient states she may change pharmacies soon.  Pharmacist Clinical Goal(s):  Marland Kitchen Over the next 90 days, patient will work with PharmD to address needs related medication management   Interventions:  Complete medication review conducted   Counseled patient to stop taking the pantoprazole and only take the omeprazole as they do the same thing.   Encouraged patient to call pharmacy and have them discontinue the pioglitazone prescription so she does not end up with medication she does not need.  Marland Kitchen  Encouraged patient to stay adherent to lisinopril and continue to pick it up regularly.   Patient Self Care Activities:   Patient will take medications as prescribed   Patient will let us know if she chooses to change pharmacies.   Please see past updates related to this goal by clicking on the "Past Updates" button in the selected goal          The patient verbalized understanding of instructions provided today and declined a print copy of patient  instruction materials.   Plan:  - Will follow up with patient in 4-5 weeks for continued medication management support  [name], PharmD Clinical Pharmacist [place] [phone]

## 2018-11-06 ENCOUNTER — Telehealth: Payer: Self-pay

## 2018-11-07 ENCOUNTER — Ambulatory Visit: Payer: Medicare Other | Admitting: Family Medicine

## 2018-11-16 ENCOUNTER — Other Ambulatory Visit
Admission: RE | Admit: 2018-11-16 | Discharge: 2018-11-16 | Disposition: A | Discharge status: Home / Self Care | Payer: Medicare Other | Source: Ambulatory Visit | Attending: Gastroenterology | Admitting: Gastroenterology

## 2018-11-16 ENCOUNTER — Other Ambulatory Visit: Payer: Self-pay

## 2018-11-16 DIAGNOSIS — Z20828 Contact with and (suspected) exposure to other viral communicable diseases: Secondary | ICD-10-CM | POA: Diagnosis not present

## 2018-11-16 DIAGNOSIS — Z01812 Encounter for preprocedural laboratory examination: Secondary | ICD-10-CM | POA: Insufficient documentation

## 2018-11-16 LAB — SARS CORONAVIRUS 2 (TAT 6-24 HRS): SARS Coronavirus 2: NEGATIVE

## 2018-11-17 DIAGNOSIS — J449 Chronic obstructive pulmonary disease, unspecified: Secondary | ICD-10-CM | POA: Diagnosis not present

## 2018-11-20 ENCOUNTER — Ambulatory Visit: Payer: Medicare Other | Admitting: Anesthesiology

## 2018-11-20 ENCOUNTER — Ambulatory Visit
Admission: RE | Admit: 2018-11-20 | Discharge: 2018-11-20 | Disposition: A | Discharge status: Home / Self Care | Payer: Medicare Other | Attending: Gastroenterology | Admitting: Gastroenterology

## 2018-11-20 ENCOUNTER — Encounter
Admission: RE | Disposition: A | Discharge status: Home / Self Care | Payer: Self-pay | Source: Home / Self Care | Attending: Gastroenterology

## 2018-11-20 ENCOUNTER — Encounter: Payer: Self-pay | Admitting: Anesthesiology

## 2018-11-20 ENCOUNTER — Other Ambulatory Visit: Payer: Self-pay

## 2018-11-20 DIAGNOSIS — Z7951 Long term (current) use of inhaled steroids: Secondary | ICD-10-CM | POA: Diagnosis not present

## 2018-11-20 DIAGNOSIS — J449 Chronic obstructive pulmonary disease, unspecified: Secondary | ICD-10-CM | POA: Diagnosis not present

## 2018-11-20 DIAGNOSIS — I10 Essential (primary) hypertension: Secondary | ICD-10-CM | POA: Insufficient documentation

## 2018-11-20 DIAGNOSIS — K219 Gastro-esophageal reflux disease without esophagitis: Secondary | ICD-10-CM | POA: Insufficient documentation

## 2018-11-20 DIAGNOSIS — F17210 Nicotine dependence, cigarettes, uncomplicated: Secondary | ICD-10-CM | POA: Insufficient documentation

## 2018-11-20 DIAGNOSIS — M1610 Unilateral primary osteoarthritis, unspecified hip: Secondary | ICD-10-CM | POA: Diagnosis not present

## 2018-11-20 DIAGNOSIS — M549 Dorsalgia, unspecified: Secondary | ICD-10-CM | POA: Diagnosis not present

## 2018-11-20 DIAGNOSIS — R1310 Dysphagia, unspecified: Secondary | ICD-10-CM | POA: Diagnosis not present

## 2018-11-20 DIAGNOSIS — E785 Hyperlipidemia, unspecified: Secondary | ICD-10-CM | POA: Insufficient documentation

## 2018-11-20 DIAGNOSIS — K222 Esophageal obstruction: Secondary | ICD-10-CM | POA: Diagnosis not present

## 2018-11-20 DIAGNOSIS — G709 Myoneural disorder, unspecified: Secondary | ICD-10-CM | POA: Diagnosis not present

## 2018-11-20 DIAGNOSIS — Z791 Long term (current) use of non-steroidal anti-inflammatories (NSAID): Secondary | ICD-10-CM | POA: Insufficient documentation

## 2018-11-20 DIAGNOSIS — Z7984 Long term (current) use of oral hypoglycemic drugs: Secondary | ICD-10-CM | POA: Insufficient documentation

## 2018-11-20 DIAGNOSIS — E1151 Type 2 diabetes mellitus with diabetic peripheral angiopathy without gangrene: Secondary | ICD-10-CM | POA: Diagnosis not present

## 2018-11-20 HISTORY — PX: ESOPHAGOGASTRODUODENOSCOPY (EGD) WITH PROPOFOL: SHX5813

## 2018-11-20 LAB — GLUCOSE, CAPILLARY: Glucose-Capillary: 118 mg/dL — ABNORMAL HIGH (ref 70–99)

## 2018-11-20 SURGERY — ESOPHAGOGASTRODUODENOSCOPY (EGD) WITH PROPOFOL
Anesthesia: General

## 2018-11-20 MED ORDER — LIDOCAINE HCL (CARDIAC) PF 100 MG/5ML IV SOSY
PREFILLED_SYRINGE | INTRAVENOUS | Status: DC | PRN
  Administered 2018-11-20: 50 mg via INTRAVENOUS

## 2018-11-20 MED ORDER — PROPOFOL 10 MG/ML IV BOLUS
INTRAVENOUS | Status: DC | PRN
  Administered 2018-11-20: 20 mg via INTRAVENOUS
  Administered 2018-11-20: 80 mg via INTRAVENOUS

## 2018-11-20 MED ORDER — SODIUM CHLORIDE 0.9 % IV SOLN
INTRAVENOUS | Status: DC
  Administered 2018-11-20: 10:00:00 via INTRAVENOUS

## 2018-11-20 MED ORDER — PROPOFOL 500 MG/50ML IV EMUL
INTRAVENOUS | Status: AC
  Filled 2018-11-20: qty 50

## 2018-11-20 MED ORDER — PROPOFOL 500 MG/50ML IV EMUL
INTRAVENOUS | Status: DC | PRN
  Administered 2018-11-20: 130 ug/kg/min via INTRAVENOUS

## 2018-11-20 MED ORDER — LIDOCAINE HCL (PF) 2 % IJ SOLN
INTRAMUSCULAR | Status: AC
  Filled 2018-11-20: qty 10

## 2018-11-20 NOTE — H&P (Signed)
Stacey Lame, MD [address] [place], [place] [address] Phone:[phone] Fax : [phone]  Primary Care Physician:  Guadalupe Maple, MD Primary Gastroenterologist:  Dr. Allen Norris  Pre-Procedure History & Physical: HPI:  Stacey Gross is a 58 y.o. female is here for an endoscopy.   Past Medical History:  Diagnosis Date  . Asthma   . Cancer ([place])    stomach  . Chronic back pain   . COPD (chronic obstructive pulmonary disease) ([place])   . Degenerative joint disease (DJD) of hip   . Diabetes mellitus without complication ([place])   . Dyspnea   . GERD (gastroesophageal reflux disease)    takes Omeprazole daily  . History of bronchitis yrs ago  . History of colon polyps    benign  . History of staph infection 10 yrs ago  . Hyperlipidemia    takes Atorvastatin daily  . Hypertension   . Peripheral vascular disease ([place])   . Tobacco use    8 cigarettes daily   . Weakness    numbness and tingling in both legs     Past Surgical History:  Procedure Laterality Date  . ABDOMINAL HYSTERECTOMY     total  . APPENDECTOMY    . BACK SURGERY  9-10 yrs ago  . BREAST BIOPSY Right    Core- neg  . COLONOSCOPY    . COLONOSCOPY WITH PROPOFOL N/A 09/20/2017   Procedure: COLONOSCOPY WITH PROPOFOL;  Surgeon: Virgel Manifold, MD;  Location: [facility] ENDOSCOPY;  Service: Endoscopy;  Laterality: N/A;  . COLONOSCOPY WITH PROPOFOL N/A 04/24/2018   Procedure: COLONOSCOPY WITH PROPOFOL;  Surgeon: Stacey Lame, MD;  Location: [facility] ENDOSCOPY;  Service: Endoscopy;  Laterality: N/A;  . ESOPHAGOGASTRODUODENOSCOPY (EGD) WITH PROPOFOL N/A 09/20/2017   Procedure: ESOPHAGOGASTRODUODENOSCOPY (EGD) WITH PROPOFOL;  Surgeon: Virgel Manifold, MD;  Location: [facility] ENDOSCOPY;  Service: Endoscopy;  Laterality: N/A;  . PERIPHERAL VASCULAR CATHETERIZATION Left 03/17/2016   Procedure: Lower Extremity Angiography;  Surgeon: Algernon Huxley, MD;  Location: [facility] CV LAB;  Service: Cardiovascular;   Laterality: Left;    Prior to Admission medications   Medication Sig Start Date End Date Taking? Authorizing Provider  atorvastatin (LIPITOR) 20 MG tablet Take 1 tablet (20 mg total) by mouth at bedtime. 03/29/18  Yes Cannady, Jolene T, NP  budesonide-formoterol (SYMBICORT) 160-4.5 MCG/ACT inhaler Inhale 2 puffs into the lungs 2 (two) times daily. 06/11/18  Yes Cannady, Jolene T, NP  cyclobenzaprine (FLEXERIL) 10 MG tablet TAKE ONE TABLET BY MOUTH AT BEDTIME 08/29/18  Yes Crissman, Jeannette How, MD  FLUoxetine (PROZAC) 20 MG capsule Take 1 capsule (20 mg total) by mouth daily. 05/09/18  Yes Crissman, Jeannette How, MD  lisinopril (PRINIVIL,ZESTRIL) 10 MG tablet Take 1 tablet (10 mg total) by mouth daily. 05/09/18  Yes Crissman, Jeannette How, MD  meloxicam (MOBIC) 15 MG tablet Take 1 tablet (15 mg total) by mouth daily. 07/24/18  Yes Guadalupe Maple, MD  metFORMIN (GLUCOPHAGE) 500 MG tablet Take 1 tablet (500 mg total) by mouth 2 (two) times daily with a meal. 06/29/18  Yes Cannady, Jolene T, NP  omeprazole (PRILOSEC) 20 MG capsule  08/22/18  Yes [provider]  Semaglutide,0.25 or 0.5MG/DOS, (OZEMPIC, 0.25 OR 0.5 MG/DOSE,) 2 MG/1.5ML SOPN Inject 0.5 mg into the skin once a week. 08/01/18  Yes Cannady, Jolene T, NP  Tiotropium Bromide Monohydrate (SPIRIVA RESPIMAT) 2.5 MCG/ACT AERS Inhale 2 puffs into the lungs daily. 06/11/18  Yes Venita Lick, NP  traZODone (  DESYREL) 50 MG tablet Take 1 tablet (50 mg total) by mouth at bedtime as needed. for sleep 05/09/18  Yes Crissman, Jeannette How, MD  albuterol (PROVENTIL HFA;VENTOLIN HFA) 108 (90 Base) MCG/ACT inhaler Inhale 2 puffs into the lungs every 6 (six) hours as needed for wheezing or shortness of breath. 05/09/18   Guadalupe Maple, MD  Blood Glucose Monitoring Suppl (ONE TOUCH ULTRA SYSTEM KIT) W/DEVICE KIT 1 kit by Does not apply route once. Patient taking differently: 1 kit by Does not apply route daily.  02/03/15   Guadalupe Maple, MD  clopidogrel (PLAVIX) 75 MG tablet  TAKE 1 TABLET BY MOUTH DAILY 09/25/18   Kris Hartmann, NP  glucose blood test strip Use to check blood sugar three times a day. 06/29/18   Cannady, Henrine Screws T, NP  HYDROcodone-acetaminophen (NORCO/VICODIN) 5-325 MG tablet Take 1 tablet by mouth every 8 (eight) hours as needed. 09/12/18   [provider]  lidocaine-prilocaine (EMLA) cream Apply 1 application topically as needed. 10/08/18   Cannady, Henrine Screws T, NP  varenicline (CHANTIX CONTINUING MONTH PAK) 1 MG tablet Take 1 tablet (1 mg total) by mouth 2 (two) times daily. Start this only after finished initial 7 day start up. 04/04/17   Marnee Guarneri T, NP    Allergies as of 10/25/2018 - Review Complete 10/16/2018  Allergen Reaction Noted  . Doxycycline hyclate Anaphylaxis, Swelling, and Rash   . Latex Rash 09/18/2013  . Vancomycin Rash     Family History  Problem Relation Age of Onset  . Hyperlipidemia Mother   . Hyperlipidemia Father   . Melanoma Sister   . Breast cancer Neg Hx     Social History   Socioeconomic History  . Marital status: Widowed    Spouse name: Not on file  . Number of children: 0  . Years of education: 8  . Highest education level: Not on file  Occupational History  . Occupation: disabled  Social Needs  . Financial resource strain: Not very hard  . Food insecurity    Worry: Never true    Inability: Never true  . Transportation needs    Medical: No    Non-medical: No  Tobacco Use  . Smoking status: Current Every Day Smoker    Packs/day: 0.50    Years: 48.00    Pack years: 24.00    Types: Cigarettes  . Smokeless tobacco: Never Used  Substance and Sexual Activity  . Alcohol use: No  . Drug use: No  . Sexual activity: Not on file  Lifestyle  . Physical activity    Days per week: 0 days    Minutes per session: 0 min  . Stress: Patient refused  Relationships  . Social connections    Talks on phone: More than three times a week    Gets together: More than three times a week    Attends  religious service: Never    Active member of club or organization: No    Attends meetings of clubs or organizations: Never    Relationship status: Widowed  . Intimate partner violence    Fear of current or ex partner: No    Emotionally abused: No    Physically abused: No    Forced sexual activity: No  Other Topics Concern  . Not on file  Social History Narrative  . Not on file    Review of Systems: See HPI, otherwise negative ROS  Physical Exam: BP (!) 149/87   Pulse 80  Temp 97.6 F (36.4 C) (Tympanic)   Resp 16   Ht 5' 7" (1.702 m)   Wt 99.8 kg   SpO2 97%   BMI 34.46 kg/m  General:   Alert,  pleasant and cooperative in NAD Head:  Normocephalic and atraumatic. Neck:  Supple; no masses or thyromegaly. Lungs:  Clear throughout to auscultation.    Heart:  Regular rate and rhythm. Abdomen:  Soft, nontender and nondistended. Normal bowel sounds, without guarding, and without rebound.   Neurologic:  Alert and  oriented x4;  grossly normal neurologically.  Impression/Plan: Stacey Gross is here for an endoscopy to be performed for dysphagia  Risks, benefits, limitations, and alternatives regarding  endoscopy have been reviewed with the patient.  Questions have been answered.  All parties agreeable.   Stacey Lame, MD  11/20/2018, 10:18 AM

## 2018-11-20 NOTE — Anesthesia Post-op Follow-up Note (Signed)
Anesthesia QCDR form completed.        

## 2018-11-20 NOTE — Transfer of Care (Signed)
Immediate Anesthesia Transfer of Care Note  Patient: Stacey Gross  Procedure(s) Performed: ESOPHAGOGASTRODUODENOSCOPY (EGD) WITH PROPOFOL (N/A )  Patient Location: PACU and Endoscopy Unit  Anesthesia Type:General  Level of Consciousness: awake, alert , oriented and patient cooperative  Airway & Oxygen Therapy: Patient Spontanous Breathing and Patient connected to nasal cannula oxygen  Post-op Assessment: Report given to RN and Post -op Vital signs reviewed and stable  Post vital signs: Reviewed and stable  Last Vitals:  Vitals Value Taken Time  BP 104/65 11/20/18 1047  Temp    Pulse 86 11/20/18 1047  Resp 17 11/20/18 1047  SpO2 99 % 11/20/18 1047  Vitals shown include unvalidated device data.  Last Pain:  Vitals:   11/20/18 1040  TempSrc: Tympanic  PainSc:          Complications: No apparent anesthesia complications

## 2018-11-20 NOTE — Anesthesia Preprocedure Evaluation (Signed)
Anesthesia Evaluation  Patient identified by MRN, date of birth, ID band Patient awake    Reviewed: Allergy & Precautions, NPO status , Patient's Chart, lab work & pertinent test results, reviewed documented beta blocker date and time   Airway Mallampati: III  TM Distance: >3 FB     Dental  (+) Chipped   Pulmonary shortness of breath, asthma , COPD, Current Smoker,           Cardiovascular hypertension, Pt. on medications + Peripheral Vascular Disease       Neuro/Psych PSYCHIATRIC DISORDERS Depression  Neuromuscular disease    GI/Hepatic GERD  ,  Endo/Other  diabetes, Type 2  Renal/GU      Musculoskeletal  (+) Arthritis ,   Abdominal   Peds  Hematology   Anesthesia Other Findings   Reproductive/Obstetrics                             Anesthesia Physical Anesthesia Plan  ASA: II  Anesthesia Plan: General   Post-op Pain Management:    Induction: Intravenous  PONV Risk Score and Plan:   Airway Management Planned:   Additional Equipment:   Intra-op Plan:   Post-operative Plan:   Informed Consent: I have reviewed the patients History and Physical, chart, labs and discussed the procedure including the risks, benefits and alternatives for the proposed anesthesia with the patient or authorized representative who has indicated his/her understanding and acceptance.       Plan Discussed with: CRNA  Anesthesia Plan Comments:         Anesthesia Quick Evaluation

## 2018-11-20 NOTE — Op Note (Signed)
[facility] Gastroenterology Patient Name: Stacey Gross Procedure Date: 11/20/2018 10:17 AM MRN: [id number] Account #: [id number] Date of Birth: [birth year] Admit Type: Outpatient Age: 58 Room: [facility] ENDO ROOM 4 Gender: Female Note Status: Finalized Procedure:            Upper GI endoscopy Indications:          Dysphagia Providers:            Lucilla Lame MD, MD Medicines:            Propofol per Anesthesia Complications:        No immediate complications. Procedure:            Pre-Anesthesia Assessment:                       - Prior to the procedure, a History and Physical was                        performed, and patient medications and allergies were                        reviewed. The patient's tolerance of previous                        anesthesia was also reviewed. The risks and benefits of                        the procedure and the sedation options and risks were                        discussed with the patient. All questions were                        answered, and informed consent was obtained. Prior                        Anticoagulants: The patient has taken no previous                        anticoagulant or antiplatelet agents. ASA Grade                        Assessment: II - A patient with mild systemic disease.                        After reviewing the risks and benefits, the patient was                        deemed in satisfactory condition to undergo the                        procedure.                       After obtaining informed consent, the endoscope was                        passed under direct vision. Throughout the procedure,                        the patient's blood pressure, pulse, and  oxygen                        saturations were monitored continuously. The Endoscope                        was introduced through the mouth, and advanced to the                        second part of duodenum. The upper GI endoscopy was                         accomplished without difficulty. The patient tolerated                        the procedure well. Findings:      The examined esophagus was normal. A TTS dilator was passed through the       scope. Dilation with a 15-16.5-18 mm balloon dilator was performed to 18       mm. The dilation site was examined following endoscope reinsertion and       showed no change. Two biopsies were obtained in the middle third of the       esophagus with cold forceps for histology.      The stomach was normal.      The examined duodenum was normal. Impression:           - Normal esophagus. Dilated.                       - Normal stomach.                       - Normal examined duodenum.                       - Two biopsies were obtained in the middle third of the                        esophagus. Recommendation:       - Discharge patient to home.                       - Resume previous diet.                       - Continue present medications.                       - Await pathology results. Procedure Code(s):    --- Professional ---                       [phone], Esophagogastroduodenoscopy, flexible, transoral;                        with transendoscopic balloon dilation of esophagus                        (less than 30 mm diameter)                       43239, 59, Esophagogastroduodenoscopy, flexible,  transoral; with biopsy, single or multiple Diagnosis Code(s):    --- Professional ---                       R13.10, Dysphagia, unspecified CPT copyright 2019 American Medical Association. All rights reserved. The codes documented in this report are preliminary and upon coder review may  be revised to meet current compliance requirements. Lucilla Lame MD, MD 11/20/2018 10:42:38 AM This report has been signed electronically. Number of Addenda: 0 Note Initiated On: 11/20/2018 10:17 AM Estimated Blood Loss: Estimated blood loss: none.      [facility]

## 2018-11-20 NOTE — Anesthesia Postprocedure Evaluation (Signed)
Anesthesia Post Note  Patient: Stacey Gross  Procedure(Gross) Performed: ESOPHAGOGASTRODUODENOSCOPY (EGD) WITH PROPOFOL (N/A )  Patient location during evaluation: Endoscopy Anesthesia Type: General Level of consciousness: awake and alert Pain management: pain level controlled Vital Signs Assessment: post-procedure vital signs reviewed and stable Respiratory status: spontaneous breathing, nonlabored ventilation, respiratory function stable and patient connected to nasal cannula oxygen Cardiovascular status: blood pressure returned to baseline and stable Postop Assessment: no apparent nausea or vomiting Anesthetic complications: no     Last Vitals:  Vitals:   11/20/18 1050 11/20/18 1100  BP: 104/65 125/87  Pulse: 90 78  Resp: (!) 28 (!) 21  Temp:    SpO2: 100% 100%    Last Pain:  Vitals:   11/20/18 1040  TempSrc: Tympanic  PainSc:                  Stacey Gross

## 2018-11-21 LAB — SURGICAL PATHOLOGY

## 2018-11-22 ENCOUNTER — Encounter: Payer: Self-pay | Admitting: Gastroenterology

## 2018-11-27 ENCOUNTER — Ambulatory Visit: Payer: Medicare Other | Admitting: Gastroenterology

## 2018-12-07 ENCOUNTER — Other Ambulatory Visit: Payer: Self-pay | Admitting: Family Medicine

## 2018-12-11 ENCOUNTER — Other Ambulatory Visit: Payer: Self-pay | Admitting: Nurse Practitioner

## 2018-12-11 DIAGNOSIS — M19011 Primary osteoarthritis, right shoulder: Secondary | ICD-10-CM | POA: Diagnosis not present

## 2018-12-11 NOTE — Telephone Encounter (Signed)
Requested medication (s) are due for refill today: yes  Requested medication (s) are on the active medication list: yes  Last refill:  08/28/2018  Future visit scheduled: yes  Notes to clinic:  Review for refill   Requested Prescriptions  Pending Prescriptions Disp Refills   CHANTIX CONTINUING MONTH PAK 1 MG tablet [Pharmacy Med Name: CHANTIX CONTINUING MONTH PAK 1 MG T] 60 tablet 2    Sig: TAKE 1 TABLET BY MOUTH 2 TIMES A DAY. START THIS ONLY AFTER FINISHED INITIAL 7DAY START UP.     Psychiatry:  Drug Dependence Therapy Passed - 12/11/2018 10:11 AM      Passed - Valid encounter within last 12 months    Recent Outpatient Visits          2 months ago Type 2 diabetes mellitus without complication, without long-term current use of insulin ([place])   [place], Lilly T, NP   2 months ago Dysphagia, unspecified type   [place], McGregor T, NP   5 months ago Rotator cuff disorder, right   [employer], [place] T, NP   5 months ago Type 2 diabetes, controlled, with peripheral circulatory disorder ([place])   [place], Jolene T, NP   5 months ago COPD with acute exacerbation ([place])   [place] Cannady, Barbaraann Faster, NP      Future Appointments            In 6 days Cannady, Barbaraann Faster, NP [employer]

## 2018-12-12 ENCOUNTER — Telehealth: Payer: Self-pay

## 2018-12-12 ENCOUNTER — Ambulatory Visit: Payer: Self-pay | Admitting: Pharmacist

## 2018-12-12 NOTE — Chronic Care Management (AMB) (Signed)
  Chronic Care Management   Note  12/12/2018 Name: JUDEA PALLESCHI MRN: [id number] DOB: 11-Aug-1960  [name] is a 58 y.o. year old female who is a primary care patient of Crissman, Jeannette How, MD. The CCM team was consulted for assistance with chronic disease management and care coordination needs.    Attempted to contact patient for medication management support. Left HIPAA compliant message for patient to return my call at her convenience.   Follow up plan:  - If I do not hear back, will outreach again in the next 4-6 weeks for continued medication management support  [name], PharmD Clinical Pharmacist [place] [phone]

## 2018-12-13 ENCOUNTER — Other Ambulatory Visit: Payer: Self-pay

## 2018-12-13 NOTE — Patient Outreach (Signed)
[facility]) Care Management  12/13/2018  CHARRYSE RABANAL 09/18/1960 [id number]   Medication Adherence call to Mrs. Tresa Moore [facility] Identifiers Verify spoke with patient she is past due on Atorvastatin 20 mg she explain she has pick up this medication in July/2020 for a 3 month supply patient takes one tablet daily, patient explain she might be behind on her medication because she was having some procedure and was taken off her medications. Mrs. Weng is showing past due under [place].   [place] Management Direct Dial [phone]  Fax [phone] [email or web address]

## 2018-12-17 ENCOUNTER — Encounter: Payer: Self-pay | Admitting: Nurse Practitioner

## 2018-12-17 ENCOUNTER — Other Ambulatory Visit: Payer: Self-pay

## 2018-12-17 ENCOUNTER — Ambulatory Visit (INDEPENDENT_AMBULATORY_CARE_PROVIDER_SITE_OTHER): Payer: Medicare Other | Admitting: Nurse Practitioner

## 2018-12-17 VITALS — BP 119/72 | HR 88 | Temp 98.1°F

## 2018-12-17 DIAGNOSIS — H9202 Otalgia, left ear: Secondary | ICD-10-CM

## 2018-12-17 DIAGNOSIS — F331 Major depressive disorder, recurrent, moderate: Secondary | ICD-10-CM | POA: Diagnosis not present

## 2018-12-17 DIAGNOSIS — R1310 Dysphagia, unspecified: Secondary | ICD-10-CM | POA: Diagnosis not present

## 2018-12-17 DIAGNOSIS — F411 Generalized anxiety disorder: Secondary | ICD-10-CM

## 2018-12-17 DIAGNOSIS — Z23 Encounter for immunization: Secondary | ICD-10-CM

## 2018-12-17 DIAGNOSIS — E785 Hyperlipidemia, unspecified: Secondary | ICD-10-CM

## 2018-12-17 DIAGNOSIS — K219 Gastro-esophageal reflux disease without esophagitis: Secondary | ICD-10-CM

## 2018-12-17 DIAGNOSIS — F17219 Nicotine dependence, cigarettes, with unspecified nicotine-induced disorders: Secondary | ICD-10-CM

## 2018-12-17 DIAGNOSIS — E1169 Type 2 diabetes mellitus with other specified complication: Secondary | ICD-10-CM

## 2018-12-17 DIAGNOSIS — R591 Generalized enlarged lymph nodes: Secondary | ICD-10-CM

## 2018-12-17 MED ORDER — SHINGRIX 50 MCG/0.5ML IM SUSR: 0.5000 mL | Freq: Once | INTRAMUSCULAR | 0 refills | Status: AC

## 2018-12-17 MED ORDER — AMOXICILLIN-POT CLAVULANATE 875-125 MG PO TABS: 1.0000 | ORAL_TABLET | Freq: Two times a day (BID) | ORAL | 0 refills | Status: AC

## 2018-12-17 MED ORDER — TRAZODONE HCL 100 MG PO TABS: 100.0000 mg | ORAL_TABLET | Freq: Every evening | ORAL | 3 refills | Status: DC | PRN

## 2018-12-17 MED ORDER — SHINGRIX 50 MCG/0.5ML IM SUSR: 0.5000 mL | Freq: Once | INTRAMUSCULAR | 0 refills | Status: DC

## 2018-12-17 MED ORDER — BUPROPION HCL ER (SR) 150 MG PO TB12: ORAL_TABLET | ORAL | 3 refills | Status: DC

## 2018-12-17 NOTE — Assessment & Plan Note (Signed)
Recommend follow-up with GI due to ongoing issues with this. Will obtain labs today.

## 2018-12-17 NOTE — Patient Instructions (Signed)

## 2018-12-17 NOTE — Assessment & Plan Note (Signed)
Chronic, ongoing.  Continue current medication regimen and adjust as needed.  Recommend follow-up with GI due to ongoing issues since recent EGD.

## 2018-12-17 NOTE — Assessment & Plan Note (Signed)
Chronic, ongoing.  Denies SI/HI.  Will discontinued Chantix, as feel this is leading to her increase in anxiety and irritability.  She self-discontinued Prozac.  Will switch to Wellbutrin, which may benefit mood and smoking.  Return to office in one week.

## 2018-12-17 NOTE — Assessment & Plan Note (Signed)
Has been exacerbated by current Chantix use and self-discontinuation of Prozac.  Will discontinue both at this time and change to Wellbutrin which may benefit both mood and smoking habit.  Return to office in one week for follow-up.

## 2018-12-17 NOTE — Assessment & Plan Note (Signed)
I have recommended complete cessation of tobacco use. I have discussed various options available for assistance with tobacco cessation including over the counter methods (Nicotine gum, patch and lozenges). We also discussed prescription options (Chantix, Nicotine Inhaler / Nasal Spray). The patient is not interested in pursuing any prescription tobacco cessation options at this time.  

## 2018-12-17 NOTE — Progress Notes (Addendum)
BP 119/72    Pulse 88    Temp 98.1 F (36.7 C) (Oral)    SpO2 94%    Subjective:    Patient ID: Stacey Gross, female    DOB: [birth year], 58 y.o.   MRN: [id number]  HPI: Stacey Gross is a 58 y.o. female  Chief Complaint  Patient presents with   Cyst    pt states she has a knot on her neck that came up about a month ago, states it is painful    SWELLING ON NECK: Showed up one month ago, patient states it is sore to touch sometimes.  Does not feel solid, is soft and has not changed in size/color.  Started out on right side and now is moving more into the front.  Her boyfriend became concerned, told her to get it looked at.  Denies trauma or irritation to area.  On review has history of lymphadenopathy and was referred to ENT in 2018.  States today she does not notice it, as she had previous days.  Reports she continues have issues with food/liquid  "returning back up". She was seen by GI and had EGD recently, three biopsies done which returned normal and patient reports they "stretched area".  Reports they told her to come back if no improvement, but she has not returned yet.  Had barium swallow which noted mild reflux and mild tertiary intermittent contractions of distal third esophagus.  She continues on Prilosec daily.  Denies constipation, cold/heat intolerance, diarrhea, hemoptysis, worsening cough, SOB, CP, fever, weight loss, or night sweats.    EAR PAIN Left ear pain started about one month ago. Duration: months Involved ear(s): left Severity:  4/10  Quality:  dull and aching Fever: no Otorrhea: no Upper respiratory infection symptoms: no Pruritus: no Hearing loss: no Water immersion no Using Q-tips: no Recurrent otitis media: no Status: stable Treatments attempted: none  ANXIETY/STRESS Was taking Prozac, but stopped taking it a month ago because it did not help.  Is taking Chantix, which has helped her to cut back on smoking, 10 cigarettes a day at this time.  States  she has had increased irritability and anxiety.  Discussed discontinuing Chantix due to increased anxiety and irritability, plus continuing off Prozac and trying Wellbutrin.  Discussed this medication with her, risks/benefits. Duration:exacerbated  Anxious mood: yes  Excessive worrying: no Irritability: yes  Sweating: no Nausea: no Palpitations:no Hyperventilation: no Panic attacks: no Agoraphobia: no  Obscessions/compulsions: no Depressed mood: no Depression screen [facility] 2/9 12/17/2018 05/09/2018 08/08/2017 02/22/2017 01/25/2017  Decreased Interest 0 0 0 0 0  Down, Depressed, Hopeless 0 0 0 0 0  PHQ - 2 Score 0 0 0 0 0  Altered sleeping 1 3 - - 0  Tired, decreased energy 1 3 - - 0  Change in appetite 0 3 - - 0  Feeling bad or failure about yourself  0 0 - - 0  Trouble concentrating 2 0 - - 0  Moving slowly or fidgety/restless 0 0 - - 0  Suicidal thoughts 0 0 - - 0  PHQ-9 Score 4 9 - - 0  Difficult doing work/chores Not difficult at all - - - Not difficult at all   Anhedonia: no Weight changes: no Insomnia: sleeps well with 100 MG Trazodone Hypersomnia: no Fatigue/loss of energy: no Feelings of worthlessness: no Feelings of guilt: no Impaired concentration/indecisiveness: yes Suicidal ideations: no  Crying spells: no Recent Stressors/Life Changes: no   Relationship problems: no  Family stress: no     Financial stress: no    Job stress: no    Recent death/loss: no  GAD 7 : Generalized Anxiety Score 12/17/2018  Nervous, Anxious, on Edge 2  Control/stop worrying 2  Worry too much - different things 2  Trouble relaxing 2  Restless 2  Easily annoyed or irritable 2  Afraid - awful might happen 0  Total GAD 7 Score 12  Anxiety Difficulty Somewhat difficult    Relevant past medical, surgical, family and social history reviewed and updated as indicated. Interim medical history since our last visit reviewed. Allergies and medications reviewed and updated.  Review of Systems    Constitutional: Negative for activity change, appetite change, diaphoresis, fatigue and fever.  Respiratory: Negative for cough, chest tightness and shortness of breath.   Cardiovascular: Negative for chest pain, palpitations and leg swelling.  Gastrointestinal: Negative for abdominal distention, abdominal pain, constipation, diarrhea, nausea and vomiting.  Endocrine: Negative for cold intolerance and heat intolerance.  Neurological: Negative for dizziness, syncope, weakness, light-headedness, numbness and headaches.  Psychiatric/Behavioral: Positive for decreased concentration. Negative for self-injury, sleep disturbance and suicidal ideas. The patient is nervous/anxious.     Per HPI unless specifically indicated above     Objective:    BP 119/72    Pulse 88    Temp 98.1 F (36.7 C) (Oral)    SpO2 94%   Wt Readings from Last 3 Encounters:  11/20/18 220 lb (99.8 kg)  10/16/18 235 lb (106.6 kg)  10/01/18 236 lb (107 kg)    Physical Exam Vitals signs and nursing note reviewed.  Constitutional:      General: She is awake. She is not in acute distress.    Appearance: She is well-developed. She is obese. She is not ill-appearing.  HENT:     Head: Normocephalic.     Right Ear: Hearing, tympanic membrane, ear canal and external ear normal. No drainage.     Left Ear: Hearing, ear canal and external ear normal. No drainage. A middle ear effusion is present. Tympanic membrane is injected.     Nose: Nose normal.     Mouth/Throat:     Mouth: Mucous membranes are moist.     Pharynx: Oropharynx is clear. Uvula midline.     Comments: Poor dentition. Eyes:     General: Lids are normal.        Right eye: No discharge.        Left eye: No discharge.     Conjunctiva/sclera: Conjunctivae normal.     Pupils: Pupils are equal, round, and reactive to light.  Neck:     Musculoskeletal: Normal range of motion and neck supple.     Thyroid: No thyromegaly.     Vascular: No carotid bruit.      Comments: No swelling noted on exam today.  No masses palpated to neck area or lymphadenopathy noted.  Both sides of neck equal.   Cardiovascular:     Rate and Rhythm: Normal rate and regular rhythm.     Heart sounds: Normal heart sounds. No murmur. No gallop.   Pulmonary:     Effort: Pulmonary effort is normal. No accessory muscle usage or respiratory distress.     Breath sounds: Normal breath sounds.     Comments: Baseline hoarseness noted. Abdominal:     General: Bowel sounds are normal.     Palpations: Abdomen is soft. There is no hepatomegaly or splenomegaly.  Musculoskeletal:     Right lower leg:  No edema.     Left lower leg: No edema.  Lymphadenopathy:     Head:     Right side of head: No submental, submandibular, tonsillar, preauricular or posterior auricular adenopathy.     Left side of head: No submental, submandibular, tonsillar, preauricular or posterior auricular adenopathy.     Cervical: No cervical adenopathy.  Skin:    General: Skin is warm and dry.  Neurological:     Mental Status: She is alert and oriented to person, place, and time.  Psychiatric:        Attention and Perception: Attention normal.        Mood and Affect: Mood normal.        Behavior: Behavior normal. Behavior is cooperative.        Thought Content: Thought content normal.        Judgment: Judgment normal.     Results for orders placed or performed during the hospital encounter of 11/20/18  Glucose, capillary  Result Value Ref Range   Glucose-Capillary 118 (H) 70 - 99 mg/dL  Surgical pathology  Result Value Ref Range   SURGICAL PATHOLOGY      Surgical Pathology CASE: [id number] PATIENT: [facility] Surgical Pathology Report     SPECIMEN SUBMITTED: A. Esophagus, mid, r/o eoe; cbx  CLINICAL HISTORY: None provided  PRE-OPERATIVE DIAGNOSIS: Dysphagia R13.10  POST-OPERATIVE DIAGNOSIS: Esophageal stricture    DIAGNOSIS: A.  ESOPHAGUS, MID; COLD BIOPSY: - STRATIFIED  SQUAMOUS EPITHELIUM WITHOUT EOSINOPHILS, NEUTROPHILS, OR REACTIVE CHANGES. - NEGATIVE FOR DYSPLASIA AND MALIGNANCY.  GROSS DESCRIPTION: A. Labeled: C BX mid esophagus, rule out EOE Received: In formalin Tissue fragment(s): 2 Size: 0.2 and 0.3 cm Description: Tan soft tissue fragments Entirely submitted in 1 cassette.   Final Diagnosis performed by Bryan Lemma, MD.   Electronically signed 11/21/2018 11:16:36AM The electronic signature indicates that the named Attending Pathologist has evaluated the specimen  Technical component performed at [facility], [address] [place], [place] [address] Lab: [phone] Dir: Rush Farmer,  MD, MMM  Professional component performed at [facility], [facility], [place], [place], [place] [address] Lab: [phone] Dir: Dellia Nims. Reuel Derby, MD       Assessment & Plan:   Problem List Items Addressed This Visit      Digestive   GERD (gastroesophageal reflux disease)    Chronic, ongoing.  Continue current medication regimen and adjust as needed.  Recommend follow-up with GI due to ongoing issues since recent EGD.      Dysphagia    Recommend follow-up with GI due to ongoing issues with this. Will obtain labs today.        Relevant Orders   Thyroid Panel With TSH   CBC with Differential/Platelet     Endocrine   Hyperlipidemia associated with type 2 diabetes mellitus (HCC)   Relevant Orders   Comprehensive metabolic panel   Lipid Panel w/o Chol/HDL Ratio     Nervous and Auditory   Nicotine dependence, cigarettes, w unsp disorders    I have recommended complete cessation of tobacco use. I have discussed various options available for assistance with tobacco cessation including over the counter methods (Nicotine gum, patch and lozenges). We also discussed prescription options (Chantix, Nicotine Inhaler / Nasal Spray). The patient is not interested in pursuing any prescription tobacco cessation options at this time.          Immune and Lymphatic   Head and neck lymphadenopathy    Suspect some lymphadenopathy, although none noted on exam  today.  Has history of similar.  No B symptoms reported.  Do not see ENT note in records.  Obtain labs today to include TSH, CMP, CBC.  Return in one week, if ongoing issues noted then will obtain imaging and refer to ENT.        Other   Depression - Primary    Chronic, ongoing.  Denies SI/HI.  Will discontinued Chantix, as feel this is leading to her increase in anxiety and irritability.  She self-discontinued Prozac.  Will switch to Wellbutrin, which may benefit mood and smoking.  Return to office in one week.      Relevant Medications   traZODone (DESYREL) 100 MG tablet   buPROPion (WELLBUTRIN SR) 150 MG 12 hr tablet   Generalized anxiety disorder    Has been exacerbated by current Chantix use and self-discontinuation of Prozac.  Will discontinue both at this time and change to Wellbutrin which may benefit both mood and smoking habit.  Return to office in one week for follow-up.      Relevant Medications   traZODone (DESYREL) 100 MG tablet   buPROPion (WELLBUTRIN SR) 150 MG 12 hr tablet   Acute ear pain, left    Suspect otitis media, script for Augmentin sent to pharmacy.  Recommend no use of Q-tips at home and may take Tylenol as needed for discomfort.  Return to office for worsening or continued pain.       Other Visit Diagnoses    Need for influenza vaccination       Relevant Orders   Flu Vaccine QUAD 36+ mos IM (Completed)       Follow up plan: Return in about 1 week (around 12/24/2018).

## 2018-12-17 NOTE — Assessment & Plan Note (Signed)
Suspect otitis media, script for Augmentin sent to pharmacy.  Recommend no use of Q-tips at home and may take Tylenol as needed for discomfort.  Return to office for worsening or continued pain.

## 2018-12-17 NOTE — Assessment & Plan Note (Signed)
Suspect some lymphadenopathy, although none noted on exam today.  Has history of similar.  No B symptoms reported.  Do not see ENT note in records.  Obtain labs today to include TSH, CMP, CBC.  Return in one week, if ongoing issues noted then will obtain imaging and refer to ENT.

## 2018-12-18 DIAGNOSIS — J449 Chronic obstructive pulmonary disease, unspecified: Secondary | ICD-10-CM | POA: Diagnosis not present

## 2018-12-18 LAB — COMPREHENSIVE METABOLIC PANEL
ALT: 23 IU/L (ref 0–32)
AST: 19 IU/L (ref 0–40)
Albumin/Globulin Ratio: 1.9 (ref 1.2–2.2)
Albumin: 4.3 g/dL (ref 3.8–4.9)
Alkaline Phosphatase: 96 IU/L (ref 39–117)
BUN/Creatinine Ratio: 12 (ref 12–28)
BUN: 10 mg/dL (ref 8–27)
Bilirubin Total: 0.5 mg/dL (ref 0.0–1.2)
CO2: 22 mmol/L (ref 20–29)
Calcium: 9.6 mg/dL (ref 8.7–10.3)
Chloride: 99 mmol/L (ref 96–106)
Creatinine, Ser: 0.84 mg/dL (ref 0.57–1.00)
GFR calc Af Amer: 87 mL/min/{1.73_m2} (ref >59)
GFR calc non Af Amer: 76 mL/min/{1.73_m2} (ref >59)
Globulin, Total: 2.3 g/dL (ref 1.5–4.5)
Glucose: 93 mg/dL (ref 65–99)
Potassium: 4.7 mmol/L (ref 3.5–5.2)
Sodium: 137 mmol/L (ref 134–144)
Total Protein: 6.6 g/dL (ref 6.0–8.5)

## 2018-12-18 LAB — CBC WITH DIFFERENTIAL/PLATELET
Basophils Absolute: 0.1 10*3/uL (ref 0.0–0.2)
Basos: 1 %
EOS (ABSOLUTE): 0.1 10*3/uL (ref 0.0–0.4)
Eos: 1 %
Hematocrit: 44.3 % (ref 34.0–46.6)
Hemoglobin: 14.9 g/dL (ref 11.1–15.9)
Immature Grans (Abs): 0.1 10*3/uL (ref 0.0–0.1)
Immature Granulocytes: 1 %
Lymphocytes Absolute: 2.6 10*3/uL (ref 0.7–3.1)
Lymphs: 26 %
MCH: 31 pg (ref 26.6–33.0)
MCHC: 33.6 g/dL (ref 31.5–35.7)
MCV: 92 fL (ref 79–97)
Monocytes Absolute: 0.5 10*3/uL (ref 0.1–0.9)
Monocytes: 5 %
Neutrophils Absolute: 6.8 10*3/uL (ref 1.4–7.0)
Neutrophils: 66 %
Platelets: 282 10*3/uL (ref 150–450)
RBC: 4.81 x10E6/uL (ref 3.77–5.28)
RDW: 12.2 % (ref 11.7–15.4)
WBC: 10.1 10*3/uL (ref 3.4–10.8)

## 2018-12-18 LAB — THYROID PANEL WITH TSH
Free Thyroxine Index: 2.7 (ref 1.2–4.9)
T3 Uptake Ratio: 27 % (ref 24–39)
T4, Total: 10.1 ug/dL (ref 4.5–12.0)
TSH: 0.884 u[IU]/mL (ref 0.450–4.500)

## 2018-12-18 LAB — LIPID PANEL W/O CHOL/HDL RATIO
Cholesterol, Total: 170 mg/dL (ref 100–199)
HDL: 41 mg/dL (ref >39)
LDL Chol Calc (NIH): 95 mg/dL (ref 0–99)
Triglycerides: 198 mg/dL — ABNORMAL HIGH (ref 0–149)
VLDL Cholesterol Cal: 34 mg/dL (ref 5–40)

## 2018-12-18 NOTE — Progress Notes (Signed)
Normal test results noted.  Please call patient and make them aware of normal results and will continue to monitor at regular visits.  Have a great day.  Look forward to seeing you at your next visit.

## 2018-12-24 ENCOUNTER — Other Ambulatory Visit: Payer: Self-pay

## 2018-12-24 ENCOUNTER — Ambulatory Visit (INDEPENDENT_AMBULATORY_CARE_PROVIDER_SITE_OTHER): Payer: Medicare Other | Admitting: Nurse Practitioner

## 2018-12-24 ENCOUNTER — Encounter: Payer: Self-pay | Admitting: Nurse Practitioner

## 2018-12-24 VITALS — BP 137/86 | HR 81 | Temp 98.3°F

## 2018-12-24 DIAGNOSIS — H9202 Otalgia, left ear: Secondary | ICD-10-CM | POA: Insufficient documentation

## 2018-12-24 DIAGNOSIS — R1310 Dysphagia, unspecified: Secondary | ICD-10-CM

## 2018-12-24 DIAGNOSIS — R591 Generalized enlarged lymph nodes: Secondary | ICD-10-CM

## 2018-12-24 MED ORDER — PANTOPRAZOLE SODIUM 40 MG PO TBEC: 40.0000 mg | DELAYED_RELEASE_TABLET | Freq: Every day | ORAL | 3 refills | Status: DC

## 2018-12-24 NOTE — Assessment & Plan Note (Signed)
Reports improvement in this after abx treatment.  Continue to monitor and return for worsening symptoms.

## 2018-12-24 NOTE — Assessment & Plan Note (Addendum)
Ongoing issue, recent labs WNL.  Is a long time smoker, have recommended complete cessation.  Has had hoarseness for over a year and recent reflex issues, will place referral to ENT for further assessment throat.  Discussed with patient.  She is going to follow-up with GI as well.  Will switch back to Protonix at this time, script sent.  Return in 3 weeks.

## 2018-12-24 NOTE — Patient Instructions (Signed)
° °Dysphagia ° °Dysphagia is trouble swallowing. This condition occurs when solids and liquids stick in a person's throat on the way down to the stomach, or when food takes longer to get to the stomach. You may have problems swallowing food, liquids, or both. You may also have pain while trying to swallow. It may take you more time and effort to swallow something. °What are the causes? °This condition is caused by: °· Problems with the muscles. They may make it difficult for you to move food and liquids through the tube that connects your mouth to your stomach (esophagus). You may have ulcers, scar tissue, or inflammation that blocks the normal passage of food and liquids. Causes of these problems include: °? Acid reflux from your stomach into your esophagus (gastroesophageal reflux). °? Infections. °? Radiation treatment for cancer. °? Medicines taken without enough fluids to wash them down into your stomach. °· Nerve problems. These prevent signals from being sent to the muscles of your esophagus to squeeze (contract) and move what you swallow down to your stomach. °· Globus pharyngeus. This is a common problem that involves feeling like something is stuck in the throat or a sense of trouble with swallowing even though nothing is wrong with the swallowing passages. °· Stroke. This can affect the nerves and make it difficult to swallow. °· Certain conditions, such as cerebral palsy or Parkinson disease. °What are the signs or symptoms? °Common symptoms of this condition include: °· A feeling that solids or liquids are stuck in your throat on the way down to the stomach. °· Food taking too long to get to the stomach. °Other symptoms include: °· Food moving back from your stomach to your mouth (regurgitation). °· Noises coming from your throat. °· Chest discomfort with swallowing. °· A feeling of fullness when swallowing. °· Drooling, especially when the throat is blocked. °· Pain while  swallowing. °· Heartburn. °· Coughing or gagging while trying to swallow. °How is this diagnosed? °This condition is diagnosed by: °· Barium X-ray. In this test, you swallow a white substance (contrast medium)that sticks to the inside of your esophagus. X-ray images are then taken. °· Endoscopy. In this test, a flexible telescope is inserted down your throat to look at your esophagus and your stomach. °· CT scans and MRI. °How is this treated? °Treatment for dysphagia depends on the cause of the condition: °· If the dysphagia is caused by acid reflux or infection, medicines may be used. They may include antibiotics and heartburn medicines. °· If the dysphagia is caused by problems with your muscles, swallowing therapy may be used to help you strengthen your swallowing muscles. You may have to do specific exercises to strengthen the muscles or stretch them. °· If the dysphagia is caused by a blockage or mass, procedures to remove the blockage may be done. You may need surgery and a feeding tube. °You may need to make diet changes. Ask your health care provider for specific instructions. °Follow these instructions at home: °Eating and drinking °· Try to eat soft food that is easier to swallow. °· Follow any diet changes as told by your health care provider. °· Cut your food into small pieces and eat slowly. °· Eat and drink only when you are sitting upright. °· Do not drink alcohol or caffeine. If you need help quitting, ask your health care provider. °General instructions °· Check your weight every day to make sure you are not losing weight. °· Take over-the-counter and prescription medicines   only as told by your health care provider. °· If you were prescribed an antibiotic medicine, take it as told by your health care provider. Do not stop taking the antibiotic even if you start to feel better. °· Do not use any products that contain nicotine or tobacco, such as cigarettes and e-cigarettes. If you need help  quitting, ask your health care provider. °· Keep all follow-up visits as told by your health care provider. This is important. °Contact a health care provider if: °· You lose weight because you cannot swallow. °· You cough when you drink liquids (aspiration). °· You cough up partially digested food. °Get help right away if: °· You cannot swallow your saliva. °· You have shortness of breath or a fever, or both. °· You have a hoarse voice and also have trouble swallowing. °Summary °· Dysphagia is trouble swallowing. This condition occurs when solids and liquids stick in a person's throat on the way down to the stomach, or when food takes longer to get to the stomach. °· Dysphagia has many possible causes and symptoms. °· Treatment for dysphagia depends on the cause of the condition. °This information is not intended to replace advice given to you by your health care provider. Make sure you discuss any questions you have with your health care provider. °Document Released: 03/18/2000 Document Revised: 03/03/2017 Document Reviewed: 03/10/2016 °Elsevier Patient Education © 2020 [employer] ° ° °

## 2018-12-24 NOTE — Assessment & Plan Note (Signed)
Reports improvement and no further swelling noted.  None noted on exam today.

## 2018-12-24 NOTE — Progress Notes (Signed)
BP 137/86   Pulse 81   Temp 98.3 F (36.8 C) (Oral)   SpO2 96%    Subjective:    Patient ID: Stacey Gross, female    DOB: Jul 05, 1960, 58 y.o.   MRN: [id number]  HPI: [name] is a 58 y.o. female  Chief Complaint  Patient presents with  . Follow-up    1 week f/up   EAR PAIN AND NECK SWELLING: States these issues have improved.  Was treated with Augmentin.  Reports no further issues with either of these.  States "You fixed my ear and I have not noticed any swelling".  DYSPHAGIA Followed by GI recently and had upper GI series, barium swallow noted reflux. Biopsies were non-cancerous.  Feels she did better with Protonix, was changed to Prilosec but feels Protonix worked better.  Is having ongoing reflux issues and hoarseness (hoarsness has been present for over a year).  Continues to smoke, about 7 to 10 cigarettes a day.  States she is hungry, but this is "making me scared to eat".  She has reached out to GI for follow-up on her testing and ongoing issues.   Duration: weeks Description of symptom: Onset: Immediately upon swallowing Location of dysphagia: throat Dysphagia to solids only: at times Dysphagia to solids & liquids: yes  Frequency:intermittent  Progressively getting worse: no Alleviatiating factors: nothing Provoking factors: unknown Status: stable EGD: yes Weight loss: no Sensation of lump in throat: yes Heartburn: yes Odynophagia: no Nausea: no Vomiting: no Drooling/nasal regurgitation/food spillage: no Coughing/choking/dysphonia: no Dysarthria: no Hematemesis: no Regurgitation of undigested food/halitosis: no Chest pain: no  Relevant past medical, surgical, family and social history reviewed and updated as indicated. Interim medical history since our last visit reviewed. Allergies and medications reviewed and updated.  Review of Systems  Constitutional: Negative for activity change, appetite change, diaphoresis, fatigue and fever.   Respiratory: Negative for cough, chest tightness, shortness of breath and wheezing.   Cardiovascular: Negative for chest pain, palpitations and leg swelling.  Gastrointestinal: Negative for abdominal distention, abdominal pain, constipation, diarrhea, nausea and vomiting.  Neurological: Negative for dizziness, syncope, weakness, light-headedness, numbness and headaches.  Psychiatric/Behavioral: Negative.     Per HPI unless specifically indicated above     Objective:    BP 137/86   Pulse 81   Temp 98.3 F (36.8 C) (Oral)   SpO2 96%   Wt Readings from Last 3 Encounters:  11/20/18 220 lb (99.8 kg)  10/16/18 235 lb (106.6 kg)  10/01/18 236 lb (107 kg)    Physical Exam Vitals signs and nursing note reviewed.  Constitutional:      General: She is awake. She is not in acute distress.    Appearance: She is well-developed. She is obese. She is not ill-appearing.  HENT:     Head: Normocephalic.     Right Ear: Hearing, tympanic membrane, ear canal and external ear normal. No drainage.     Left Ear: Hearing, tympanic membrane, ear canal and external ear normal. No drainage.     Nose: Nose normal.     Mouth/Throat:     Mouth: Mucous membranes are moist.     Pharynx: Posterior oropharyngeal erythema (Mild erythema with cobblestone appearance) present. No pharyngeal swelling or oropharyngeal exudate.     Comments: Hoarseness with talking noted. Eyes:     General: Lids are normal.        Right eye: No discharge.        Left eye: No discharge.  Conjunctiva/sclera: Conjunctivae normal.     Pupils: Pupils are equal, round, and reactive to light.  Neck:     Musculoskeletal: Normal range of motion and neck supple.     Thyroid: No thyromegaly.     Vascular: No carotid bruit.  Cardiovascular:     Rate and Rhythm: Normal rate and regular rhythm.     Heart sounds: Normal heart sounds. No murmur. No gallop.   Pulmonary:     Effort: Pulmonary effort is normal. No accessory muscle usage or  respiratory distress.     Breath sounds: Normal breath sounds.  Abdominal:     General: Bowel sounds are normal.     Palpations: Abdomen is soft. There is no hepatomegaly or splenomegaly.  Musculoskeletal:     Right lower leg: No edema.     Left lower leg: No edema.  Lymphadenopathy:     Cervical: No cervical adenopathy.  Skin:    General: Skin is warm and dry.  Neurological:     Mental Status: She is alert and oriented to person, place, and time.  Psychiatric:        Attention and Perception: Attention normal.        Mood and Affect: Mood normal.        Behavior: Behavior normal. Behavior is cooperative.        Thought Content: Thought content normal.        Judgment: Judgment normal.     Results for orders placed or performed in visit on 12/17/18  Thyroid Panel With TSH  Result Value Ref Range   TSH 0.884 0.450 - 4.500 uIU/mL   T4, Total 10.1 4.5 - 12.0 ug/dL   T3 Uptake Ratio 27 24 - 39 %   Free Thyroxine Index 2.7 1.2 - 4.9  CBC with Differential/Platelet  Result Value Ref Range   WBC 10.1 3.4 - 10.8 x10E3/uL   RBC 4.81 3.77 - 5.28 x10E6/uL   Hemoglobin 14.9 11.1 - 15.9 g/dL   Hematocrit 44.3 34.0 - 46.6 %   MCV 92 79 - 97 fL   MCH 31.0 26.6 - 33.0 pg   MCHC 33.6 31.5 - 35.7 g/dL   RDW 12.2 11.7 - 15.4 %   Platelets 282 150 - 450 x10E3/uL   Neutrophils 66 Not Estab. %   Lymphs 26 Not Estab. %   Monocytes 5 Not Estab. %   Eos 1 Not Estab. %   Basos 1 Not Estab. %   Neutrophils Absolute 6.8 1.4 - 7.0 x10E3/uL   Lymphocytes Absolute 2.6 0.7 - 3.1 x10E3/uL   Monocytes Absolute 0.5 0.1 - 0.9 x10E3/uL   EOS (ABSOLUTE) 0.1 0.0 - 0.4 x10E3/uL   Basophils Absolute 0.1 0.0 - 0.2 x10E3/uL   Immature Granulocytes 1 Not Estab. %   Immature Grans (Abs) 0.1 0.0 - 0.1 x10E3/uL  Comprehensive metabolic panel  Result Value Ref Range   Glucose 93 65 - 99 mg/dL   BUN 10 8 - 27 mg/dL   Creatinine, Ser 0.84 0.57 - 1.00 mg/dL   GFR calc non Af Amer 76 >59 mL/min/1.73   GFR  calc Af Amer 87 >59 mL/min/1.73   BUN/Creatinine Ratio 12 12 - 28   Sodium 137 134 - 144 mmol/L   Potassium 4.7 3.5 - 5.2 mmol/L   Chloride 99 96 - 106 mmol/L   CO2 22 20 - 29 mmol/L   Calcium 9.6 8.7 - 10.3 mg/dL   Total Protein 6.6 6.0 - 8.5 g/dL   Albumin 4.3  3.8 - 4.9 g/dL   Globulin, Total 2.3 1.5 - 4.5 g/dL   Albumin/Globulin Ratio 1.9 1.2 - 2.2   Bilirubin Total 0.5 0.0 - 1.2 mg/dL   Alkaline Phosphatase 96 39 - 117 IU/L   AST 19 0 - 40 IU/L   ALT 23 0 - 32 IU/L  Lipid Panel w/o Chol/HDL Ratio  Result Value Ref Range   Cholesterol, Total 170 100 - 199 mg/dL   Triglycerides 198 (H) 0 - 149 mg/dL   HDL 41 >39 mg/dL   VLDL Cholesterol Cal 34 5 - 40 mg/dL   LDL Chol Calc (NIH) 95 0 - 99 mg/dL      Assessment & Plan:   Problem List Items Addressed This Visit      Digestive   Dysphagia - Primary    Ongoing issue, recent labs WNL.  Is a long time smoker, have recommended complete cessation.  Has had hoarseness for over a year and recent reflex issues, will place referral to ENT for further assessment throat.  Discussed with patient.  She is going to follow-up with GI as well.  Will switch back to Protonix at this time, script sent.  Return in 3 weeks.      Relevant Orders   Ambulatory referral to ENT     Immune and Lymphatic   Head and neck lymphadenopathy    Reports improvement and no further swelling noted.  None noted on exam today.        Other   RESOLVED: Ear pain, left    Reports improvement in this after abx treatment.  Continue to monitor and return for worsening symptoms.          Follow up plan: Return in about 3 weeks (around 01/14/2019) for Dysphagia, HTN/HLD, T2DM.

## 2019-01-07 DIAGNOSIS — H73002 Acute myringitis, left ear: Secondary | ICD-10-CM | POA: Diagnosis not present

## 2019-01-07 DIAGNOSIS — K219 Gastro-esophageal reflux disease without esophagitis: Secondary | ICD-10-CM | POA: Diagnosis not present

## 2019-01-08 ENCOUNTER — Ambulatory Visit: Payer: Medicare Other | Admitting: Family Medicine

## 2019-01-08 ENCOUNTER — Ambulatory Visit: Payer: Medicare Other | Admitting: Nurse Practitioner

## 2019-01-15 ENCOUNTER — Ambulatory Visit (INDEPENDENT_AMBULATORY_CARE_PROVIDER_SITE_OTHER): Payer: Medicare Other | Admitting: Pharmacist

## 2019-01-15 DIAGNOSIS — E119 Type 2 diabetes mellitus without complications: Secondary | ICD-10-CM | POA: Diagnosis not present

## 2019-01-15 DIAGNOSIS — F17219 Nicotine dependence, cigarettes, with unspecified nicotine-induced disorders: Secondary | ICD-10-CM

## 2019-01-15 NOTE — Chronic Care Management (AMB) (Signed)
Chronic Care Management   Follow Up Note   01/15/2019 Name: Stacey Gross MRN: [id number] DOB: October 23, 1960  Referred by: Stacey Maple, MD Reason for referral : Chronic Care Management (Medication Management)   Stacey Gross is a 58 y.o. year old female who is a primary care patient of Crissman, Jeannette How, MD. The CCM team was consulted for assistance with chronic disease management and care coordination needs.    Contacted patient for medication management review.   Reports that she received her first dose of Shingrix last week. Unfortunately, she was quite sick afterwards - notes her arm became swollen and blistered, and she had nausea, vomiting, and diarrhea, resulting in a 12 lb weight loss in ~4 days. However, after 4 days, she recovered and is feeling normal now. Hx of shingles, so she still plans on receiving her second shot.   Review of patient status, including review of consultants reports, relevant laboratory and other test results, and collaboration with appropriate care team members and the patient's provider was performed as part of comprehensive patient evaluation and provision of chronic care management services.    SDOH (Social Determinants of Health) screening performed today: Financial Strain  Tobacco Use Stress. See Care Plan for related entries.   Advanced Directives Status: N See Care Plan and [facility] application for related entries.  Outpatient Encounter Medications as of 01/15/2019  Medication Sig Note  . albuterol (VENTOLIN HFA) 108 (90 Base) MCG/ACT inhaler INHALE 2 PUFFS BY MOUTH INTO THE LUNGS EVERY 6 HOURS AS NEEDED FOR WHEEZING OR SHORTNESS OF BREATH   . Blood Glucose Monitoring Suppl (ONE TOUCH ULTRA SYSTEM KIT) W/DEVICE KIT 1 kit by Does not apply route once. (Patient taking differently: 1 kit by Does not apply route daily. )   . budesonide-formoterol (SYMBICORT) 160-4.5 MCG/ACT inhaler Inhale 2 puffs into the lungs 2 (two) times daily.   Marland Kitchen buPROPion  (WELLBUTRIN SR) 150 MG 12 hr tablet Take 150 mg (one pill) once daily for 3 days; then increase to 150 mg twice daily.   . metFORMIN (GLUCOPHAGE) 500 MG tablet Take 1 tablet (500 mg total) by mouth 2 (two) times daily with a meal.   . pantoprazole (PROTONIX) 40 MG tablet Take 1 tablet (40 mg total) by mouth daily.   . Semaglutide,0.25 or 0.5MG/DOS, (OZEMPIC, 0.25 OR 0.5 MG/DOSE,) 2 MG/1.5ML SOPN Inject 0.5 mg into the skin once a week.   . Tiotropium Bromide Monohydrate (SPIRIVA RESPIMAT) 2.5 MCG/ACT AERS Inhale 2 puffs into the lungs daily.   Marland Kitchen atorvastatin (LIPITOR) 20 MG tablet Take 1 tablet (20 mg total) by mouth at bedtime.   . clopidogrel (PLAVIX) 75 MG tablet TAKE 1 TABLET BY MOUTH DAILY   . cyclobenzaprine (FLEXERIL) 10 MG tablet TAKE ONE TABLET BY MOUTH AT BEDTIME   . glucose blood test strip Use to check blood sugar three times a day.   Marland Kitchen HYDROcodone-acetaminophen (NORCO/VICODIN) 5-325 MG tablet Take 1 tablet by mouth every 8 (eight) hours as needed.   . lidocaine-prilocaine (EMLA) cream Apply 1 application topically as needed.   Marland Kitchen lisinopril (PRINIVIL,ZESTRIL) 10 MG tablet Take 1 tablet (10 mg total) by mouth daily. 08/28/2018: QAM  . meloxicam (MOBIC) 15 MG tablet Take 1 tablet (15 mg total) by mouth daily.   . traZODone (DESYREL) 100 MG tablet Take 1 tablet (100 mg total) by mouth at bedtime as needed for sleep.   . [DISCONTINUED] Zoster Vaccine Adjuvanted [facility]) injection Inject 0.5 mLs into the muscle once for  1 dose. Dose number 2.    No facility-administered encounter medications on file as of 01/15/2019.      Goals Addressed            This Visit's Progress     Patient Stated   . PharmD "I want to get my A1c down" (pt-stated)       Current Barriers:  . Diabetes: uncontrolled but improved, most recent A1c 7.0% . Current antihyperglycemic regimen: metformin 500 mg BID, Ozempic 0.5 mg once weekly . Current blood glucose readings: Fasting <130, post prandial <180  generally . Current meal patterns: o Has cut back on sodas; previously was drinking ~3 per day, now at 1/2 per day  . Current exercise:  o Walking ~1 mile daily; previously was waking 2 miles a day  o Was [place] the [place] recently and was able to walk much further than she previously could  . Cardiovascular risk reduction: o Current hypertensive regimen: lisinopril 10 mg daily, BP generally well controlled <130/80 at recent appointments o Current hyperlipidemia regimen: atorvastatin 20 mg; LDL at goal <100  Pharmacist Clinical Goal(s):  Marland Kitchen Over the next 90 days, patient with work with PharmD and primary care provider to address optimized medication management  Interventions: . Congratulated patient on maintenance of goal BG.  . Reviewed goal A1c, fasting glucose, and 2 hour post prandial glucose. Provided this in writing  Patient Self Care Activities:  . Patient will check blood glucose regularly , document, and provide at future appointments . Patient will take medications as prescribed . Patient will report any questions or concerns to provider   Please see past updates related to this goal by clicking on the "Past Updates" button in the selected goal      . PharmD "My breathing is bad right now" (pt-stated)       Current Barriers:  . Tobacco abuse since age 48; currently smoking 1/2 ppd . Previous quit attempt for ~2 months with husband, but quickly relapsed. Has been on Chantix therapy, which helped her reduce how much she is smoking. However, increased irritability led to Chantix + fluoxetine being discontinued, and bupropion being started a few weeks ago. Reports lessened irritability . Is interested in using nicotine replacement therapy   Pharmacist Clinical Goal(s):  Marland Kitchen Over the next 60 days, patient will work with PharmD and provider towards tobacco cessation  Interventions: . Comprehensive medication review performed, medication list in electronic medical record updated  . Recommend NRT patches + gum/lozenges in combination with bupropion. Patient notes that she cannot afford them if not covered by insurance. Recommended that she contact the [place] (1-800-QUIT-NOW). Patient will outreach this group for support.  Patient Self Care Activities:  . Patient will commit to reducing tobacco consumption  Initial goal documentation          Plan:  - Encouraged patient to contact office to schedule follow up with Marnee Guarneri, NP - Will outreach patient in the next 4-6 weeks for continued medication management support  [name], PharmD Clinical Pharmacist [place] [phone]

## 2019-01-15 NOTE — Patient Instructions (Addendum)
Stacey Gross,   It was great to talk to you today!  I am so excited to hear that you were able to get away to the mountains to relax and recharge. We all need that after this crazy year!  1) Keep up the great work with the metformin twice daily and Ozempic weekly. We want to keep your A1c less than 7%, and that will correspond with fasting blood sugars less than 130, and 2 hour post-meal blood sugars less than 180.   2) Bupropion can help you quit smoking, but is likely to be more successful if we add the nicotine patches and/or gum. Call 1-800-QUIT-NOW 949 343 6004), and they can provide with you with nicotine patches and gum. I recommend using a patch every day, and then using gum or lozenges throughout the day if you have breakthrough cravings.   3) Call clinic and schedule follow up with Marnee Guarneri at your convenience.   Feel free to give me a call with any questions or concerns!  [name], PharmD [phone]  Visit Information  Goals Addressed            This Visit's Progress     Patient Stated   . PharmD "I want to get my A1c down" (pt-stated)       Current Barriers:  . Diabetes: uncontrolled but improved, most recent A1c 7.0% . Current antihyperglycemic regimen: metformin 500 mg BID, Ozempic 0.5 mg once weekly . Current blood glucose readings: Fasting <130, post prandial <180 generally . Current meal patterns: o Has cut back on sodas; previously was drinking ~3 per day, now at 1/2 per day  . Current exercise:  o Walking ~1 mile daily; previously was waking 2 miles a day  o Was [place] in the mountains recently and was able to walk much further than she previously could  . Cardiovascular risk reduction: o Current hypertensive regimen: lisinopril 10 mg daily, BP generally well controlled <130/80 at recent appointments o Current hyperlipidemia regimen: atorvastatin 20 mg; LDL at goal <100  Pharmacist Clinical Goal(s):  Marland Kitchen Over the next 90 days, patient with work with  PharmD and primary care provider to address optimized medication management  Interventions: . Congratulated patient on maintenance of goal BG.  . Reviewed goal A1c, fasting glucose, and 2 hour post prandial glucose. Provided this in writing  Patient Self Care Activities:  . Patient will check blood glucose regularly , document, and provide at future appointments . Patient will take medications as prescribed . Patient will report any questions or concerns to provider   Please see past updates related to this goal by clicking on the "Past Updates" button in the selected goal      . PharmD "My breathing is bad right now" (pt-stated)       Current Barriers:  . Tobacco abuse since age 37; currently smoking 1/2 ppd . Previous quit attempt for ~2 months with husband, but quickly relapsed. Has been on Chantix therapy, which helped her reduce how much she is smoking. However, increased irritability led to Chantix + fluoxetine being discontinued, and bupropion being started a few weeks ago. Reports lessened irritability . Is interested in using nicotine replacement therapy   Pharmacist Clinical Goal(s):  Marland Kitchen Over the next 60 days, patient will work with PharmD and provider towards tobacco cessation  Interventions: . Comprehensive medication review performed, medication list in electronic medical record updated . Recommend NRT patches + gum/lozenges in combination with bupropion. Patient notes that she cannot afford them if  not covered by insurance. Recommended that she contact the [place] (1-800-QUIT-NOW). Patient will outreach this group for support.  Patient Self Care Activities:  . Patient will commit to reducing tobacco consumption  Initial goal documentation         The patient verbalized understanding of instructions provided today and declined a print copy of patient instruction materials.   Plan:  - Encouraged patient to contact office to schedule follow up with Marnee Guarneri, NP - Will outreach patient in the next 4-6 weeks for continued medication management support  [name], PharmD Clinical Pharmacist [place] [phone]

## 2019-01-17 DIAGNOSIS — J449 Chronic obstructive pulmonary disease, unspecified: Secondary | ICD-10-CM | POA: Diagnosis not present

## 2019-01-17 DIAGNOSIS — M19019 Primary osteoarthritis, unspecified shoulder: Secondary | ICD-10-CM | POA: Insufficient documentation

## 2019-01-29 ENCOUNTER — Encounter (INDEPENDENT_AMBULATORY_CARE_PROVIDER_SITE_OTHER): Payer: Medicare Other

## 2019-01-29 ENCOUNTER — Ambulatory Visit (INDEPENDENT_AMBULATORY_CARE_PROVIDER_SITE_OTHER): Payer: Medicare Other | Admitting: Vascular Surgery

## 2019-02-01 ENCOUNTER — Other Ambulatory Visit: Payer: Self-pay

## 2019-02-01 ENCOUNTER — Emergency Department: Payer: Medicare Other

## 2019-02-01 ENCOUNTER — Emergency Department
Admission: EM | Admit: 2019-02-01 | Discharge: 2019-02-01 | Disposition: A | Discharge status: Home / Self Care | Payer: Medicare Other | Attending: Emergency Medicine | Admitting: Emergency Medicine

## 2019-02-01 ENCOUNTER — Ambulatory Visit: Payer: Self-pay | Admitting: *Deleted

## 2019-02-01 DIAGNOSIS — R0602 Shortness of breath: Secondary | ICD-10-CM | POA: Diagnosis not present

## 2019-02-01 DIAGNOSIS — F17210 Nicotine dependence, cigarettes, uncomplicated: Secondary | ICD-10-CM | POA: Diagnosis not present

## 2019-02-01 DIAGNOSIS — E119 Type 2 diabetes mellitus without complications: Secondary | ICD-10-CM | POA: Insufficient documentation

## 2019-02-01 DIAGNOSIS — Z85020 Personal history of malignant carcinoid tumor of stomach: Secondary | ICD-10-CM | POA: Diagnosis not present

## 2019-02-01 DIAGNOSIS — J441 Chronic obstructive pulmonary disease with (acute) exacerbation: Secondary | ICD-10-CM | POA: Insufficient documentation

## 2019-02-01 DIAGNOSIS — Z20828 Contact with and (suspected) exposure to other viral communicable diseases: Secondary | ICD-10-CM | POA: Diagnosis not present

## 2019-02-01 DIAGNOSIS — I10 Essential (primary) hypertension: Secondary | ICD-10-CM | POA: Diagnosis not present

## 2019-02-01 DIAGNOSIS — D123 Benign neoplasm of transverse colon: Secondary | ICD-10-CM | POA: Insufficient documentation

## 2019-02-01 DIAGNOSIS — Z91040 Latex allergy status: Secondary | ICD-10-CM | POA: Insufficient documentation

## 2019-02-01 DIAGNOSIS — R05 Cough: Secondary | ICD-10-CM | POA: Diagnosis not present

## 2019-02-01 DIAGNOSIS — D122 Benign neoplasm of ascending colon: Secondary | ICD-10-CM | POA: Insufficient documentation

## 2019-02-01 LAB — CBC WITH DIFFERENTIAL/PLATELET
Abs Immature Granulocytes: 0.11 10*3/uL — ABNORMAL HIGH (ref 0.00–0.07)
Basophils Absolute: 0.1 10*3/uL (ref 0.0–0.1)
Basophils Relative: 1 %
Eosinophils Absolute: 0.1 10*3/uL (ref 0.0–0.5)
Eosinophils Relative: 1 %
HCT: 41.8 % (ref 36.0–46.0)
Hemoglobin: 13.8 g/dL (ref 12.0–15.0)
Immature Granulocytes: 1 %
Lymphocytes Relative: 18 %
Lymphs Abs: 1.9 10*3/uL (ref 0.7–4.0)
MCH: 29.9 pg (ref 26.0–34.0)
MCHC: 33 g/dL (ref 30.0–36.0)
MCV: 90.7 fL (ref 80.0–100.0)
Monocytes Absolute: 0.3 10*3/uL (ref 0.1–1.0)
Monocytes Relative: 3 %
Neutro Abs: 7.9 10*3/uL — ABNORMAL HIGH (ref 1.7–7.7)
Neutrophils Relative %: 76 %
Platelets: 305 10*3/uL (ref 150–400)
RBC: 4.61 MIL/uL (ref 3.87–5.11)
RDW: 12.5 % (ref 11.5–15.5)
WBC: 10.3 10*3/uL (ref 4.0–10.5)
nRBC: 0 % (ref 0.0–0.2)

## 2019-02-01 LAB — BASIC METABOLIC PANEL
Anion gap: 11 (ref 5–15)
BUN: 12 mg/dL (ref 6–20)
CO2: 23 mmol/L (ref 22–32)
Calcium: 9.1 mg/dL (ref 8.9–10.3)
Chloride: 102 mmol/L (ref 98–111)
Creatinine, Ser: 0.82 mg/dL (ref 0.44–1.00)
GFR calc Af Amer: >60 mL/min (ref >60)
GFR calc non Af Amer: >60 mL/min (ref >60)
Glucose, Bld: 140 mg/dL — ABNORMAL HIGH (ref 70–99)
Potassium: 4.3 mmol/L (ref 3.5–5.1)
Sodium: 136 mmol/L (ref 135–145)

## 2019-02-01 LAB — BLOOD GAS, VENOUS
Acid-Base Excess: 0.3 mmol/L (ref 0.0–2.0)
Bicarbonate: 23.3 mmol/L (ref 20.0–28.0)
FIO2: 0.28
O2 Saturation: 86.2 %
Patient temperature: 37
pCO2, Ven: 32 mmHg — ABNORMAL LOW (ref 44.0–60.0)
pH, Ven: 7.47 — ABNORMAL HIGH (ref 7.250–7.430)
pO2, Ven: 48 mmHg — ABNORMAL HIGH (ref 32.0–45.0)

## 2019-02-01 LAB — SARS CORONAVIRUS 2 (TAT 6-24 HRS): SARS Coronavirus 2: NEGATIVE

## 2019-02-01 LAB — BRAIN NATRIURETIC PEPTIDE: B Natriuretic Peptide: 48 pg/mL (ref 0.0–100.0)

## 2019-02-01 MED ORDER — METHYLPREDNISOLONE SODIUM SUCC 125 MG IJ SOLR
INTRAMUSCULAR | Status: AC
  Administered 2019-02-01: 125 mg via INTRAVENOUS
  Filled 2019-02-01: qty 2

## 2019-02-01 MED ORDER — LEVOFLOXACIN 750 MG PO TABS
750.0000 mg | ORAL_TABLET | Freq: Once | ORAL | Status: AC
  Administered 2019-02-01: 750 mg via ORAL
  Filled 2019-02-01: qty 1

## 2019-02-01 MED ORDER — METHYLPREDNISOLONE SODIUM SUCC 125 MG IJ SOLR
125.0000 mg | Freq: Once | INTRAMUSCULAR | Status: AC
  Administered 2019-02-01: 11:00:00 125 mg via INTRAVENOUS

## 2019-02-01 MED ORDER — LEVOFLOXACIN 750 MG PO TABS: 750.0000 mg | ORAL_TABLET | Freq: Every day | ORAL | 0 refills | Status: DC

## 2019-02-01 MED ORDER — IPRATROPIUM-ALBUTEROL 0.5-2.5 (3) MG/3ML IN SOLN
3.0000 mL | Freq: Once | RESPIRATORY_TRACT | Status: AC
  Administered 2019-02-01: 3 mL via RESPIRATORY_TRACT

## 2019-02-01 MED ORDER — IPRATROPIUM-ALBUTEROL 0.5-2.5 (3) MG/3ML IN SOLN
3.0000 mL | Freq: Once | RESPIRATORY_TRACT | Status: AC
  Administered 2019-02-01: 11:00:00 3 mL via RESPIRATORY_TRACT

## 2019-02-01 MED ORDER — IPRATROPIUM-ALBUTEROL 0.5-2.5 (3) MG/3ML IN SOLN
RESPIRATORY_TRACT | Status: AC
  Administered 2019-02-01: 3 mL via RESPIRATORY_TRACT
  Filled 2019-02-01: qty 9

## 2019-02-01 MED ORDER — PREDNISONE 10 MG (21) PO TBPK: ORAL_TABLET | ORAL | 0 refills | Status: DC

## 2019-02-01 NOTE — Telephone Encounter (Signed)
Noted and agree with plan of care for patient to ER, can follow-up with practice after ER visit.

## 2019-02-01 NOTE — ED Triage Notes (Signed)
Pt c/o increased SOB with cough and congestion for the past several days, pt has a congested cough in triage, pt is tachypneic in triage, hx of COPD/asthma

## 2019-02-01 NOTE — ED Provider Notes (Signed)
[facility] Emergency Department Provider Note       Time seen: ----------------------------------------- 11:06 AM on 02/01/2019 -----------------------------------------   I have reviewed the triage vital signs and the nursing notes.  HISTORY   Chief Complaint Shortness of Breath    HPI Stacey Gross is a 58 y.o. female with a history of asthma, stomach cancer, COPD, diabetes, hyperlipidemia, hypertension who presents to the ED for 1 week of worsening shortness of breath with cough and congestion.  Patient arrives tachypneic.  She states she has been taking her breathing treatments at home without much improvement.  Past Medical History:  Diagnosis Date  . Asthma   . Cancer ([place])    stomach  . Chronic back pain   . COPD (chronic obstructive pulmonary disease) ([place])   . Degenerative joint disease (DJD) of hip   . Diabetes mellitus without complication ([place])   . Dyspnea   . GERD (gastroesophageal reflux disease)    takes Omeprazole daily  . History of bronchitis yrs ago  . History of colon polyps    benign  . History of staph infection 10 yrs ago  . Hyperlipidemia    takes Atorvastatin daily  . Hypertension   . Peripheral vascular disease ([place])   . Tobacco use    8 cigarettes daily   . Weakness    numbness and tingling in both legs     Patient Active Problem List   Diagnosis Date Noted  . Generalized anxiety disorder 12/17/2018  . Dysphagia 09/27/2018  . Rotator cuff disorder, right 07/13/2018  . Special screening for malignant neoplasms, colon   . Type 2 diabetes, controlled, with peripheral circulatory disorder ([place]) 03/21/2018  . Hx of colonic polyps   . Benign neoplasm of ascending colon   . Benign neoplasm of transverse colon   . Polyp of sigmoid colon   . Benign neoplasm of rectosigmoid junction   . Diverticulosis of large intestine without diverticulitis   . Ectopic gastric tissue   . Aortic atherosclerosis ([place]) 04/18/2017   . Advanced care planning/counseling discussion 02/22/2017  . Head and neck lymphadenopathy 11/14/2016  . Blue toe syndrome ([place]) 03/04/2016  . Atherosclerosis of native arteries of extremity with intermittent claudication ([place]) 03/04/2016  . COPD (chronic obstructive pulmonary disease) ([place]) 03/26/2015  . Osteoarthritis of spine with radiculopathy, lumbar region 03/06/2015  . Spinal stenosis at L4-L5 level 02/25/2015  . GERD (gastroesophageal reflux disease) 12/30/2014  . Depression 12/30/2014  . Hyperlipidemia associated with type 2 diabetes mellitus ([place])   . Hypertension   . Diabetes ([place])   . Degenerative joint disease (DJD) of hip   . Fusion of spine of lumbar region   . Claudication ([place]) 07/02/2013  . S/P lumbar fusion 07/02/2013  . Nicotine dependence, cigarettes, w unsp disorders 02/23/2010    Past Surgical History:  Procedure Laterality Date  . ABDOMINAL HYSTERECTOMY     total  . APPENDECTOMY    . BACK SURGERY  9-10 yrs ago  . BREAST BIOPSY Right    Core- neg  . COLONOSCOPY    . COLONOSCOPY WITH PROPOFOL N/A 09/20/2017   Procedure: COLONOSCOPY WITH PROPOFOL;  Surgeon: Virgel Manifold, MD;  Location: [facility] ENDOSCOPY;  Service: Endoscopy;  Laterality: N/A;  . COLONOSCOPY WITH PROPOFOL N/A 04/24/2018   Procedure: COLONOSCOPY WITH PROPOFOL;  Surgeon: Lucilla Lame, MD;  Location: [facility] ENDOSCOPY;  Service: Endoscopy;  Laterality: N/A;  . ESOPHAGOGASTRODUODENOSCOPY (EGD) WITH PROPOFOL N/A 09/20/2017   Procedure: ESOPHAGOGASTRODUODENOSCOPY (EGD) WITH PROPOFOL;  Surgeon: Virgel Manifold, MD;  Location: [facility] ENDOSCOPY;  Service: Endoscopy;  Laterality: N/A;  . ESOPHAGOGASTRODUODENOSCOPY (EGD) WITH PROPOFOL N/A 11/20/2018   Procedure: ESOPHAGOGASTRODUODENOSCOPY (EGD) WITH PROPOFOL;  Surgeon: Lucilla Lame, MD;  Location: [facility] ENDOSCOPY;  Service: Endoscopy;  Laterality: N/A;  . PERIPHERAL VASCULAR CATHETERIZATION Left 03/17/2016   Procedure: Lower Extremity Angiography;   Surgeon: Algernon Huxley, MD;  Location: [facility];  Service: Cardiovascular;  Laterality: Left;    Allergies Doxycycline hyclate, Latex, and Vancomycin  Social History Social History   Tobacco Use  . Smoking status: Current Every Day Smoker    Packs/day: 0.50    Years: 48.00    Pack years: 24.00    Types: Cigarettes  . Smokeless tobacco: Never Used  Substance Use Topics  . Alcohol use: No  . Drug use: No   Review of Systems Constitutional: Negative for fever. Cardiovascular: Negative for chest pain. Respiratory: Positive for shortness of breath and cough Gastrointestinal: Negative for abdominal pain, vomiting and diarrhea. Musculoskeletal: Negative for back pain. Skin: Negative for rash. Neurological: Negative for headaches, focal weakness or numbness.  All systems negative/normal/unremarkable except as stated in the HPI  ____________________________________________   PHYSICAL EXAM:  VITAL SIGNS: ED Triage Vitals [02/01/19 1055]  Enc Vitals Group     BP (!) 143/107     Pulse Rate 88     Resp (!) 30     Temp 99.1 F (37.3 C)     Temp Source Oral     SpO2 95 %     Weight 220 lb (99.8 kg)     Height 5\' 7"  (1.702 m)     Head Circumference      Peak Flow      Pain Score 0     Pain Loc      Pain Edu?      Excl. in [place]?    Constitutional: Alert and oriented.  Anxious, mild distress Eyes: Conjunctivae are normal. Normal extraocular movements. ENT      Head: Normocephalic and atraumatic.      Nose: No congestion/rhinnorhea.      Mouth/Throat: Mucous membranes are moist.      Neck: No stridor. Cardiovascular: Normal rate, regular rhythm. No murmurs, rubs, or gallops. Respiratory: Mild tachypnea with wheezing and rhonchi bilaterally Gastrointestinal: Soft and nontender. Normal bowel sounds Musculoskeletal: Nontender with normal range of motion in extremities. No lower extremity tenderness nor edema. Neurologic:  Normal speech and language. No gross focal  neurologic deficits are appreciated.  Skin:  Skin is warm, dry and intact. No rash noted. Psychiatric: Anxious, speech is normal ____________________________________________  EKG: Interpreted by me.  Sinus rhythm with rate of 92 bpm, low voltage, normal axis, normal QT  ____________________________________________  ED COURSE:  As part of my medical decision making, I reviewed the following data within the [place] History obtained from family if available, nursing notes, old chart and ekg, as well as notes from prior ED visits. Patient presented for likely COPD exacerbation, we will assess with labs and imaging as indicated at this time. Clinical Course as of Jan 31 1326  Fri Feb 01, 2019  1225 Patient is feeling better, wants to go home.   [JW]    Clinical Course User Index [JW] Earleen Newport, MD   Procedures  [name] was evaluated in Emergency Department on 02/01/2019 for the symptoms described in the history of present illness. She was evaluated in the context of the global  COVID-19 pandemic, which necessitated consideration that the patient might be at risk for infection with the SARS-CoV-2 virus that causes COVID-19. Institutional protocols and algorithms that pertain to the evaluation of patients at risk for COVID-19 are in a state of rapid change based on information released by regulatory bodies including the CDC and federal and state organizations. These policies and algorithms were followed during the patient's care in the ED.  ____________________________________________   LABS (pertinent positives/negatives)  Labs Reviewed  CBC WITH DIFFERENTIAL/PLATELET - Abnormal; Notable for the following components:      Result Value   Neutro Abs 7.9 (*)    Abs Immature Granulocytes 0.11 (*)    All other components within normal limits  BASIC METABOLIC PANEL - Abnormal; Notable for the following components:   Glucose, Bld 140 (*)    All other  components within normal limits  BLOOD GAS, VENOUS - Abnormal; Notable for the following components:   pH, Ven 7.47 (*)    pCO2, Ven 32 (*)    pO2, Ven 48.0 (*)    All other components within normal limits  SARS CORONAVIRUS 2 (TAT 6-24 HRS)  BRAIN NATRIURETIC PEPTIDE  TROPONIN I (HIGH SENSITIVITY)    RADIOLOGY Images were viewed by me  Chest x-ray IMPRESSION:  Mild peribronchial thickening. Heart and mediastinal contours are  within normal limits. No focal opacities or effusions. No acute bony  abnormality.  ____________________________________________   DIFFERENTIAL DIAGNOSIS   COPD, pneumonia, CHF, COVID-19, bronchitis  FINAL ASSESSMENT AND PLAN  COPD exacerbation   Plan: The patient had presented for a COPD exacerbation.  Patient received stacked duo nebs and steroids on arrival with oral Levaquin.  Patient's labs are grossly unremarkable. Patient's imaging did not reveal any acute process.  She has improved after nebs and steroids.  She will be discharged home on similar with Levaquin.  She is cleared for outpatient follow-up and wants to go home.   Laurence Aly, MD    Note: This note was generated in part or whole with voice recognition software. Voice recognition is usually quite accurate but there are transcription errors that can and very often do occur. I apologize for any typographical errors that were not detected and corrected.     Earleen Newport, MD 02/01/19 1328

## 2019-02-01 NOTE — ED Notes (Signed)
AAOx3.  Skin warm and dry.  NAD 

## 2019-02-01 NOTE — Telephone Encounter (Signed)
Patient calls with SOB/productive cough and left sided CP and reports she has some vessel blockage. Severe pain lasting up to 10 minutes yesterday including left arm pain and sweating during episode. Advised ED at this time and wear a mask. Stated understanding and husband will drive her now.  Reason for Disposition . [1] Chest pain lasts > 5 minutes AND [2] age > 9  Answer Assessment - Initial Assessment Questions 1. LOCATION: "Where does it hurt?"       Left sided CP. 2. RADIATION: "Does the pain go anywhere else?" (e.g., into neck, jaw, arms, back)     She is unsure 3. ONSET: "When did the chest pain begin?" (Minutes, hours or days)      On and off yesterday 4. PATTERN "Does the pain come and go, or has it been constant since it started?"  "Does it get worse with exertion?"      Comes and goes 5. DURATION: "How long does it last" (e.g., seconds, minutes, hours)     Sometimes up to 10 minutes 6. SEVERITY: "How bad is the pain?"  (e.g., Scale 1-10; mild, moderate, or severe)    - MILD (1-3): doesn't interfere with normal activities     - MODERATE (4-7): interferes with normal activities or awakens from sleep    - SEVERE (8-10): excruciating pain, unable to do any normal activities       Severe 7. CARDIAC RISK FACTORS: "Do you have any history of heart problems or risk factors for heart disease?" (e.g., angina, prior heart attack; diabetes, high blood pressure, high cholesterol, smoker, or strong family history of heart disease)     Heart disease, has a blockage she reported. 8. PULMONARY RISK FACTORS: "Do you have any history of lung disease?"  (e.g., blood clots in lung, asthma, emphysema, birth control pills)      9. CAUSE: "What do you think is causing the chest pain?"     *No Answer* 10. OTHER SYMPTOMS: "Do you have any other symptoms?" (e.g., dizziness, nausea, vomiting, sweating, fever, difficulty breathing, cough)       Sweating, SOB, cough. No fever 11. PREGNANCY: "Is there any  chance you are pregnant?" "When was your last menstrual period?"       na  Protocols used: CHEST PAIN-A-AH

## 2019-02-05 ENCOUNTER — Telehealth: Payer: Self-pay | Admitting: Family Medicine

## 2019-02-05 NOTE — Telephone Encounter (Signed)
Routing to provider  

## 2019-02-05 NOTE — Telephone Encounter (Signed)
Patient currently on Protonix.  Can we verify this with her?  She is not on Omeprazole at this time.  She also needs a follow-up scheduled for this month for her A1C check with her diabetes.  Can we schedule this please.

## 2019-02-05 NOTE — Telephone Encounter (Signed)
Called and spoke with patient. Patient stats that she is taking the Protonix and left over RX omeprazole. Patient stated that she is out of Omeprazole and she will be fine with out it until next OV to discuss that with provider. Hospital follow up scheduled for Monday 02/11/19 at 1:30 pm.

## 2019-02-05 NOTE — Telephone Encounter (Signed)
Noted, thank you.  Yes, she should not be taking both Prilosec and Protonix.  They are in same family.

## 2019-02-05 NOTE — Telephone Encounter (Signed)
Incoming fax requesting omeprazole mg cap with a quantity of 180.

## 2019-02-06 ENCOUNTER — Encounter (INDEPENDENT_AMBULATORY_CARE_PROVIDER_SITE_OTHER): Payer: Self-pay | Admitting: Nurse Practitioner

## 2019-02-06 ENCOUNTER — Other Ambulatory Visit: Payer: Self-pay

## 2019-02-06 ENCOUNTER — Ambulatory Visit (INDEPENDENT_AMBULATORY_CARE_PROVIDER_SITE_OTHER): Payer: Medicare Other

## 2019-02-06 ENCOUNTER — Ambulatory Visit (INDEPENDENT_AMBULATORY_CARE_PROVIDER_SITE_OTHER): Payer: Medicare Other | Admitting: Nurse Practitioner

## 2019-02-06 VITALS — BP 144/87 | HR 90 | Resp 16 | Wt 225.0 lb

## 2019-02-06 DIAGNOSIS — J411 Mucopurulent chronic bronchitis: Secondary | ICD-10-CM | POA: Diagnosis not present

## 2019-02-06 DIAGNOSIS — I70213 Atherosclerosis of native arteries of extremities with intermittent claudication, bilateral legs: Secondary | ICD-10-CM

## 2019-02-06 DIAGNOSIS — I75023 Atheroembolism of bilateral lower extremities: Secondary | ICD-10-CM

## 2019-02-06 DIAGNOSIS — E1151 Type 2 diabetes mellitus with diabetic peripheral angiopathy without gangrene: Secondary | ICD-10-CM

## 2019-02-06 DIAGNOSIS — K219 Gastro-esophageal reflux disease without esophagitis: Secondary | ICD-10-CM | POA: Diagnosis not present

## 2019-02-06 MED ORDER — CILOSTAZOL 100 MG PO TABS: 100.0000 mg | ORAL_TABLET | Freq: Two times a day (BID) | ORAL | 3 refills | Status: DC

## 2019-02-08 ENCOUNTER — Telehealth: Payer: Self-pay | Admitting: Family Medicine

## 2019-02-08 ENCOUNTER — Other Ambulatory Visit: Payer: Self-pay

## 2019-02-08 NOTE — Telephone Encounter (Signed)
Called and LVM for patient asking for her to please return my call.  

## 2019-02-08 NOTE — Telephone Encounter (Signed)
Medication Refill - Medication: Omeprazole 20mg   Has the patient contacted their pharmacy? Yes - pharmacy calling (Agent: If no, request that the patient contact the pharmacy for the refill.) (Agent: If yes, when and what did the pharmacy advise?)  Preferred Pharmacy (with phone number or street name):  [place], [place] - [place] [phone] (Phone) [phone] (Fax)   Agent: Please be advised that RX refills may take up to 3 business days. We ask that you follow-up with your pharmacy.

## 2019-02-08 NOTE — Telephone Encounter (Signed)
Patient requesting Omeprazole and it not on current medication list

## 2019-02-08 NOTE — Telephone Encounter (Signed)
Routing to provider to advise.  

## 2019-02-08 NOTE — Telephone Encounter (Signed)
Please speak with patient.  She should have Protonix at home and should not be taking both Omeprazole and Protonix.  If she would prefer Omeprazole I can send it in, but she should not take both and would have to stop taking Protonix.  Thanks.

## 2019-02-10 ENCOUNTER — Encounter (INDEPENDENT_AMBULATORY_CARE_PROVIDER_SITE_OTHER): Payer: Self-pay | Admitting: Nurse Practitioner

## 2019-02-10 NOTE — Progress Notes (Signed)
SUBJECTIVE:  Patient ID: Stacey Gross, female    DOB: [birth year], 58 y.o.   MRN: [id number] Chief Complaint  Patient presents with   Follow-up    ultrasound follow up    HPI  Stacey Gross is a 58 y.o. female evidence today for follow-up of blue toe syndrome.  Previously the patient was started on Plavix which greatly helped her toe pain.  At this time her toe pain is virtually nonexistent however she does note that her distal toes turning a bluish color from time to time.  However, she states that the color change happens whether her feet are warm or cold.  The patient does endorse having extensive neuropathy in her bilateral lower extremities.  She denies any rest pain or ulcerations.  She denies any fever, chills, nausea, vomiting or diarrhea.  Today the patient has an ABI 1.17 on her right lower extremity with an ABI 1.15 on her left.  She has triphasic on her bilateral tibial arteries, however the patient has normal great toe digit waveforms but the rest of the digits are severely dampened.  However, previous studies done on 07/27/2018 had normal digital waveforms.  Past Medical History:  Diagnosis Date   Asthma    Cancer ([place])    stomach   Chronic back pain    COPD (chronic obstructive pulmonary disease) (HCC)    Degenerative joint disease (DJD) of hip    Diabetes mellitus without complication (HCC)    Dyspnea    GERD (gastroesophageal reflux disease)    takes Omeprazole daily   History of bronchitis yrs ago   History of colon polyps    benign   History of staph infection 10 yrs ago   Hyperlipidemia    takes Atorvastatin daily   Hypertension    Peripheral vascular disease (HCC)    Tobacco use    8 cigarettes daily    Weakness    numbness and tingling in both legs     Past Surgical History:  Procedure Laterality Date   ABDOMINAL HYSTERECTOMY     total   APPENDECTOMY     BACK SURGERY  9-10 yrs ago   BREAST BIOPSY Right    Core- neg    COLONOSCOPY     COLONOSCOPY WITH PROPOFOL N/A 09/20/2017   Procedure: COLONOSCOPY WITH PROPOFOL;  Surgeon: Virgel Manifold, MD;  Location: [facility] ENDOSCOPY;  Service: Endoscopy;  Laterality: N/A;   COLONOSCOPY WITH PROPOFOL N/A 04/24/2018   Procedure: COLONOSCOPY WITH PROPOFOL;  Surgeon: Lucilla Lame, MD;  Location: [facility] ENDOSCOPY;  Service: Endoscopy;  Laterality: N/A;   ESOPHAGOGASTRODUODENOSCOPY (EGD) WITH PROPOFOL N/A 09/20/2017   Procedure: ESOPHAGOGASTRODUODENOSCOPY (EGD) WITH PROPOFOL;  Surgeon: Virgel Manifold, MD;  Location: [facility] ENDOSCOPY;  Service: Endoscopy;  Laterality: N/A;   ESOPHAGOGASTRODUODENOSCOPY (EGD) WITH PROPOFOL N/A 11/20/2018   Procedure: ESOPHAGOGASTRODUODENOSCOPY (EGD) WITH PROPOFOL;  Surgeon: Lucilla Lame, MD;  Location: [facility] ENDOSCOPY;  Service: Endoscopy;  Laterality: N/A;   PERIPHERAL VASCULAR CATHETERIZATION Left 03/17/2016   Procedure: Lower Extremity Angiography;  Surgeon: Algernon Huxley, MD;  Location: [place] [facility];  Service: Cardiovascular;  Laterality: Left;    Social History   Socioeconomic History   Marital status: Widowed    Spouse name: Not on file   Number of children: 0   Years of education: 8   Highest education level: Not on file  Occupational History   Occupation: disabled  Social Designer, fashion/clothing strain: Not very hard   Food insecurity  Worry: Never true    Inability: Never true   Transportation needs    Medical: No    Non-medical: No  Tobacco Use   Smoking status: Current Every Day Smoker    Packs/day: 0.50    Years: 48.00    Pack years: 24.00    Types: Cigarettes   Smokeless tobacco: Never Used  Substance and Sexual Activity   Alcohol use: No   Drug use: No   Sexual activity: Not on file  Lifestyle   Physical activity    Days per week: 0 days    Minutes per session: 0 min   Stress: Patient refused  Relationships   Social connections    Talks on phone: More than three times a  week    Gets together: More than three times a week    Attends religious service: Never    Active member of club or organization: No    Attends meetings of clubs or organizations: Never    Relationship status: Widowed   Intimate partner violence    Fear of current or ex partner: No    Emotionally abused: No    Physically abused: No    Forced sexual activity: No  Other Topics Concern   Not on file  Social History Narrative   Not on file    Family History  Problem Relation Age of Onset   Hyperlipidemia Mother    Hyperlipidemia Father    Melanoma Sister    Breast cancer Neg Hx     Allergies  Allergen Reactions   Doxycycline Hyclate Anaphylaxis, Swelling and Rash   Latex Rash   Vancomycin Rash    All mycins     Review of Systems   Review of Systems: Negative Unless Checked Constitutional: _0 Weight loss  _1 Fever  _2 Chills Cardiac: _3 Chest pain   _4  Atrial Fibrillation  _5 Palpitations   _6 Shortness of breath when laying flat   _7 Shortness of breath with exertion. _8 Shortness of breath at rest Vascular:  _9 Pain in legs with walking   _10 Pain in legs with standing _11 Pain in legs when laying flat   _12 Claudication    _13 Pain in feet when laying flat    _14 History of DVT   _15 Phlebitis   _16 Swelling in legs   _17 Varicose veins   _18 Non-healing ulcers Pulmonary:   _19 Uses home oxygen   _20 Productive cough   _21 Hemoptysis   _22 Wheeze  _23 COPD   _24 Asthma Neurologic:  _25 Dizziness   _26 Seizures  _27 Blackouts _28 History of stroke   _29 History of TIA  _30 Aphasia   _31 Temporary Blindness   _32 Weakness or numbness in arm   _33 Weakness or numbness in leg Musculoskeletal:   _34 Joint swelling   _35 Joint pain   _36 Low back pain  _37  History of Knee Replacement _38 Arthritis _39 back Surgeries  _40  Spinal Stenosis    Hematologic:  _41 Easy bruising  _42 Easy bleeding   _43 Hypercoagulable state   _44 Anemic Gastrointestinal:  _45 Diarrhea   _46 Vomiting  _47 Gastroesophageal reflux/heartburn   _48 Difficulty swallowing.  _49 Abdominal pain Genitourinary:  _50 Chronic kidney disease   _51 Difficult urination  _52 Anuric   _53 Blood in urine _54 Frequent urination  _55 Burning with urination   _56 Hematuria Skin:  _57 Rashes   _58 Ulcers _59 Wounds Psychological:  _60 History of anxiety   _61  History of major depression  _62  Memory Difficulties      OBJECTIVE:   Physical Exam  BP (!) 144/87 (BP Location: Left Arm)    Pulse 90    Resp 16    Wt 225 lb (102.1 kg)    BMI 35.24 kg/m  Gen: WD/WN, NAD Head: [place]/AT, No temporalis wasting.  Ear/Nose/Throat: Hearing grossly intact, nares w/o erythema or drainage Eyes: PER, EOMI, sclera nonicteric.  Neck: Supple, no masses.  No JVD.  Pulmonary:  Good air movement, no use of accessory muscles.  Cardiac: RRR Vascular:  Distal digits bilaterally light dusky blue.  No ulcerations present Vessel Right Left  Radial Palpable Palpable  Dorsalis Pedis Palpable Palpable  Posterior Tibial Palpable Palpable   Gastrointestinal: soft, non-distended. No guarding/no peritoneal signs.  Musculoskeletal: M/S 5/5 throughout.  No deformity or atrophy.  Neurologic: Pain and light touch intact in extremities.  Symmetrical.  Speech is fluent. Motor exam as listed above. Psychiatric: Judgment intact, Mood & affect appropriate for pt's clinical situation. Dermatologic: No Venous rashes. No Ulcers Noted.  No changes consistent with cellulitis. Lymph : No Cervical lymphadenopathy, no lichenification or skin changes of chronic lymphedema.       ASSESSMENT AND PLAN:  1. Atherosclerosis of native artery of both lower extremities with intermittent claudication (HCC) Based upon the patient's symptoms as well as noninvasive studies is not entirely clear if this is due to Raynaud's versus microvascular disease versus distal embolization.  Distal embolization is not as likely due to the fact that there are not any acute changes seen.  We will attempt to utilize Pletal for the next 3 months to determine if there is any  improvement in her symptoms.  The patient will continue to stay on Plavix at this time.  She will return in 3 months with noninvasive studies.  If the patient's lower extremity symptoms as well as studies have not improved at her follow-up visit we will discuss proceeding angiogram to determine if there is evidence of distal occlusion or microvascular disease.  2. Type 2 diabetes, controlled, with peripheral circulatory disorder (HCC) Continue hypoglycemic medications as already ordered, these medications have been reviewed and there are no changes at this time.  Hgb A1C to be monitored as already arranged by primary service   3. Mucopurulent chronic bronchitis ([place]) Continue pulmonary medications and aerosols as already ordered, these medications have been reviewed and there are no changes at this time.    4. Gastroesophageal reflux disease without esophagitis Continue PPI as already ordered, this medication has been reviewed and there are no changes at this time.  Avoidence of caffeine and alcohol  Moderate elevation of the head of the bed    Current Outpatient Medications on File Prior to Visit  Medication Sig Dispense Refill   albuterol (VENTOLIN HFA) 108 (90 Base) MCG/ACT inhaler INHALE 2 PUFFS BY MOUTH INTO THE LUNGS EVERY 6 HOURS AS NEEDED FOR WHEEZING OR SHORTNESS OF BREATH 8.5 g 0   atorvastatin (LIPITOR) 20 MG tablet Take 1 tablet (20 mg total) by mouth at bedtime. 90 tablet 4   Blood Glucose Monitoring Suppl (ONE TOUCH ULTRA SYSTEM KIT) W/DEVICE KIT 1 kit by Does not apply route once. (Patient taking differently: 1 kit by Does not apply route daily. ) 100 each 12   budesonide-formoterol (SYMBICORT) 160-4.5 MCG/ACT inhaler Inhale 2 puffs into the lungs 2 (two) times daily. 1 Inhaler 3   buPROPion (WELLBUTRIN SR) 150 MG 12 hr tablet Take 150 mg (one pill) once daily for 3 days; then increase to 150 mg twice daily. 180 tablet 3   clopidogrel (PLAVIX) 75 MG tablet TAKE 1  TABLET BY MOUTH DAILY 30 tablet 4   cyclobenzaprine (FLEXERIL) 10 MG tablet TAKE ONE TABLET BY MOUTH AT BEDTIME 30 tablet 3  glucose blood test strip Use to check blood sugar three times a day. 200 each 12   HYDROcodone-acetaminophen (NORCO/VICODIN) 5-325 MG tablet Take 1 tablet by mouth every 8 (eight) hours as needed.     levofloxacin (LEVAQUIN) 750 MG tablet Take 1 tablet (750 mg total) by mouth daily. 5 tablet 0   lidocaine-prilocaine (EMLA) cream Apply 1 application topically as needed. 30 g 0   lisinopril (PRINIVIL,ZESTRIL) 10 MG tablet Take 1 tablet (10 mg total) by mouth daily. 90 tablet 4   meloxicam (MOBIC) 15 MG tablet Take 1 tablet (15 mg total) by mouth daily. 30 tablet 3   metFORMIN (GLUCOPHAGE) 500 MG tablet Take 1 tablet (500 mg total) by mouth 2 (two) times daily with a meal. 180 tablet 3   pantoprazole (PROTONIX) 40 MG tablet Take 1 tablet (40 mg total) by mouth daily. 30 tablet 3   predniSONE (STERAPRED UNI-PAK 21 TAB) 10 MG (21) TBPK tablet Dispense steroid taper pack as directed 21 tablet 0   Semaglutide,0.25 or 0.5MG/DOS, (OZEMPIC, 0.25 OR 0.5 MG/DOSE,) 2 MG/1.5ML SOPN Inject 0.5 mg into the skin once a week. 3 pen 5   Tiotropium Bromide Monohydrate (SPIRIVA RESPIMAT) 2.5 MCG/ACT AERS Inhale 2 puffs into the lungs daily. 1 Inhaler 3   traZODone (DESYREL) 100 MG tablet Take 1 tablet (100 mg total) by mouth at bedtime as needed for sleep. 90 tablet 3   No current facility-administered medications on file prior to visit.     There are no Patient Instructions on file for this visit. No follow-ups on file.   Kris Hartmann, NP  This note was completed with Sales executive.  Any errors are purely unintentional.

## 2019-02-11 ENCOUNTER — Other Ambulatory Visit: Payer: Self-pay

## 2019-02-11 ENCOUNTER — Encounter: Payer: Self-pay | Admitting: Nurse Practitioner

## 2019-02-11 ENCOUNTER — Ambulatory Visit (INDEPENDENT_AMBULATORY_CARE_PROVIDER_SITE_OTHER): Payer: Medicare Other | Admitting: Nurse Practitioner

## 2019-02-11 VITALS — BP 109/77 | HR 86 | Temp 98.3°F | Ht 67.0 in | Wt 224.0 lb

## 2019-02-11 DIAGNOSIS — E1151 Type 2 diabetes mellitus with diabetic peripheral angiopathy without gangrene: Secondary | ICD-10-CM

## 2019-02-11 DIAGNOSIS — K219 Gastro-esophageal reflux disease without esophagitis: Secondary | ICD-10-CM

## 2019-02-11 DIAGNOSIS — J441 Chronic obstructive pulmonary disease with (acute) exacerbation: Secondary | ICD-10-CM

## 2019-02-11 DIAGNOSIS — F17219 Nicotine dependence, cigarettes, with unspecified nicotine-induced disorders: Secondary | ICD-10-CM

## 2019-02-11 DIAGNOSIS — I700 Atherosclerosis of aorta: Secondary | ICD-10-CM | POA: Diagnosis not present

## 2019-02-11 DIAGNOSIS — E785 Hyperlipidemia, unspecified: Secondary | ICD-10-CM | POA: Diagnosis not present

## 2019-02-11 DIAGNOSIS — E1169 Type 2 diabetes mellitus with other specified complication: Secondary | ICD-10-CM

## 2019-02-11 DIAGNOSIS — Z114 Encounter for screening for human immunodeficiency virus [HIV]: Secondary | ICD-10-CM

## 2019-02-11 DIAGNOSIS — E1159 Type 2 diabetes mellitus with other circulatory complications: Secondary | ICD-10-CM

## 2019-02-11 DIAGNOSIS — Z1159 Encounter for screening for other viral diseases: Secondary | ICD-10-CM

## 2019-02-11 DIAGNOSIS — I10 Essential (primary) hypertension: Secondary | ICD-10-CM

## 2019-02-11 DIAGNOSIS — I152 Hypertension secondary to endocrine disorders: Secondary | ICD-10-CM

## 2019-02-11 LAB — MICROALBUMIN, URINE WAIVED
Creatinine, Urine Waived: 200 mg/dL (ref 10–300)
Microalb, Ur Waived: 30 mg/L — ABNORMAL HIGH (ref 0–19)
Microalb/Creat Ratio: <30 mg/g (ref <30)

## 2019-02-11 LAB — BAYER DCA HB A1C WAIVED: HB A1C (BAYER DCA - WAIVED): 7 % — ABNORMAL HIGH (ref <7.0)

## 2019-02-11 MED ORDER — ALBUTEROL SULFATE HFA 108 (90 BASE) MCG/ACT IN AERS: INHALATION_SPRAY | RESPIRATORY_TRACT | 5 refills | Status: DC

## 2019-02-11 NOTE — Assessment & Plan Note (Signed)
I have recommended complete cessation of tobacco use.  Continue Wellbutrin and consider patches in future, discussed with patient [place] Hotline.

## 2019-02-11 NOTE — Assessment & Plan Note (Addendum)
Chronic, ongoing noted on lung CT screening.  Continue statin and Plavix daily for prevention.  Continue collaboration with vascular.

## 2019-02-11 NOTE — Assessment & Plan Note (Addendum)
Chronic, ongoing with A1C today 7.0%.  Urine ALB 30 and A:C <30.  Continue current medication regimen + checking BS at home daily.  Monitor diet at home and reduce carbs and foods high in glucose.  Return in 3 months.

## 2019-02-11 NOTE — Telephone Encounter (Signed)
Called and spoke to patient. She states she prefers protonix and spoke to [place] about this today.

## 2019-02-11 NOTE — Assessment & Plan Note (Signed)
Chronic, ongoing.  Continue Protonix daily and collaboration with ENT.  Mag level today.

## 2019-02-11 NOTE — Assessment & Plan Note (Signed)
Chronic, ongoing.  Continue current medication regimen and adjust as needed.  Lipid and CMP today. 

## 2019-02-11 NOTE — Assessment & Plan Note (Addendum)
Acute and improved at this time.  Will continue current inhaler regimen at this time and plan for spirometry next visit.  May benefit from change to Trelegy vs using two inhalers daily, will discuss with CCM PharmD.  Consider pulmonary referral if worsening COPD or frequent exacerbation.

## 2019-02-11 NOTE — Progress Notes (Signed)
BP 109/77   Pulse 86   Temp 98.3 F (36.8 C) (Oral)   Ht 5\' 7"  (1.702 m)   Wt 224 lb (101.6 kg)   SpO2 95%   BMI 35.08 kg/m    Subjective:    Patient ID: Stacey Gross, female    DOB: 1960/09/26, 58 y.o.   MRN: [id number]  HPI: Stacey Gross is a 58 y.o. female  Chief Complaint  Patient presents with  . Hospitalization Follow-up  . Medication Refill    meloxicam   ER FOLLOW UP  Seen in ER on 02/01/2019 for COPD exacerbation.  Sent home with prescriptions for Levofloxacin and Prednisone.  Continues on Symbicort, Spiriva, and Albuterol for COPD.  Initial lung CT screening performed 05/30/2018 noting emphysema and negative for CA findings with recommendation to return in one year.  CXR during recent ER visit noted peribronchial thickening, consistent with CT.  Not currently followed by pulmonary.  Continues to smoke, is on Wellbutrin. Smokes about 14 cigarettes a day, was at 7 but due to pain has increased.   Time since discharge: 02/01/2019 Hospital/facility: [facility] Diagnosis: COPD exacerbation Procedures/tests: CXR and labs (CBC with normal WBC and mild elevation neutrophils) Consultants: none New medications: noted above Discharge instructions:  Follow-up with PCP Status: stable   DIABETES A1C last in July 7.0%.  Continues on Metformin 500 MG BID and Ozempic 0.5MG  weekly. Hypoglycemic episodes:no Polydipsia/polyuria: no Visual disturbance: no Chest pain: no Paresthesias: no Glucose Monitoring: yes  Accucheck frequency: Daily  Fasting glucose: yesterday morning 98 and 114 this morning, 110 average  Post prandial:  Evening:  Before meals: Taking Insulin?: no  Long acting insulin:  Short acting insulin: Blood Pressure Monitoring: not checking Retinal Examination: Not up to Date Foot Exam: Up to Date Pneumovax: Up to Date Influenza: Up to Date Aspirin: no   HYPERTENSION / HYPERLIPIDEMIA Continues on Atorvastatin and Lisinopril.   Satisfied with current  treatment? yes Duration of hypertension: chronic BP monitoring frequency: not checking BP range:  BP medication side effects: no Duration of hyperlipidemia: chronic Cholesterol medication side effects: no Cholesterol supplements: none Medication compliance: good compliance Aspirin: no Recent stressors: no Recurrent headaches: no Visual changes: no Palpitations: no Dyspnea: no Chest pain: no Lower extremity edema: no Dizzy/lightheaded: no   GERD Continues on Protonix.  Seen by GI in August with endoscopy done for dysphagia, negative for malignancy.  Has also been seen by ENT recently. She returns in January and may be having biopsy at that time. GERD control status: stable  Satisfied with current treatment? yes Heartburn frequency:  Medication side effects: no  Medication compliance: stable Previous GERD medications: Prilosec Alleviatiating factors:  Protonix Aggravating factors: certain foods Dysphagia: intermittent, followed by GI and ENT recently Odynophagia:  no Hematemesis: no Blood in stool: no EGD: yes, recent  Relevant past medical, surgical, family and social history reviewed and updated as indicated. Interim medical history since our last visit reviewed. Allergies and medications reviewed and updated.  Review of Systems  Constitutional: Negative for activity change, appetite change, diaphoresis, fatigue and fever.  Respiratory: Negative for cough, chest tightness and shortness of breath.   Cardiovascular: Negative for chest pain, palpitations and leg swelling.  Gastrointestinal: Negative for abdominal distention, abdominal pain, constipation, diarrhea, nausea and vomiting.  Endocrine: Negative for cold intolerance, heat intolerance, polydipsia, polyphagia and polyuria.  Neurological: Negative for dizziness, syncope, weakness, light-headedness, numbness and headaches.  Psychiatric/Behavioral: Negative.     Per HPI unless specifically indicated above  Objective:    BP 109/77   Pulse 86   Temp 98.3 F (36.8 C) (Oral)   Ht 5\' 7"  (1.702 m)   Wt 224 lb (101.6 kg)   SpO2 95%   BMI 35.08 kg/m   Wt Readings from Last 3 Encounters:  02/11/19 224 lb (101.6 kg)  02/06/19 225 lb (102.1 kg)  02/01/19 220 lb (99.8 kg)    Physical Exam Vitals signs and nursing note reviewed.  Constitutional:      General: She is awake. She is not in acute distress.    Appearance: She is well-developed. She is obese. She is not ill-appearing.  HENT:     Head: Normocephalic.     Right Ear: Hearing normal.     Left Ear: Hearing normal.  Eyes:     General: Lids are normal.        Right eye: No discharge.        Left eye: No discharge.     Conjunctiva/sclera: Conjunctivae normal.     Pupils: Pupils are equal, round, and reactive to light.  Neck:     Musculoskeletal: Normal range of motion and neck supple.     Thyroid: No thyromegaly.     Vascular: No carotid bruit.  Cardiovascular:     Rate and Rhythm: Normal rate and regular rhythm.     Heart sounds: Normal heart sounds. No murmur. No gallop.   Pulmonary:     Effort: Pulmonary effort is normal. No accessory muscle usage or respiratory distress.     Breath sounds: Wheezing present.     Comments: Intermittent expiratory wheezes noted throughout + baseline hoarseness noted.  Abdominal:     General: Bowel sounds are normal.     Palpations: Abdomen is soft.  Musculoskeletal:     Right lower leg: No edema.     Left lower leg: No edema.  Lymphadenopathy:     Cervical: No cervical adenopathy.  Skin:    General: Skin is warm and dry.  Neurological:     Mental Status: She is alert and oriented to person, place, and time.  Psychiatric:        Attention and Perception: Attention normal.        Mood and Affect: Mood normal.        Behavior: Behavior normal. Behavior is cooperative.        Thought Content: Thought content normal.        Judgment: Judgment normal.     Results for orders placed or  performed during the hospital encounter of 02/01/19  SARS CORONAVIRUS 2 (TAT 6-24 HRS) Nasopharyngeal Nasopharyngeal Swab   Specimen: Nasopharyngeal Swab  Result Value Ref Range   SARS Coronavirus 2 NEGATIVE NEGATIVE  CBC with Differential  Result Value Ref Range   WBC 10.3 4.0 - 10.5 K/uL   RBC 4.61 3.87 - 5.11 MIL/uL   Hemoglobin 13.8 12.0 - 15.0 g/dL   HCT 41.8 36.0 - 46.0 %   MCV 90.7 80.0 - 100.0 fL   MCH 29.9 26.0 - 34.0 pg   MCHC 33.0 30.0 - 36.0 g/dL   RDW 12.5 11.5 - 15.5 %   Platelets 305 150 - 400 K/uL   nRBC 0.0 0.0 - 0.2 %   Neutrophils Relative % 76 %   Neutro Abs 7.9 (H) 1.7 - 7.7 K/uL   Lymphocytes Relative 18 %   Lymphs Abs 1.9 0.7 - 4.0 K/uL   Monocytes Relative 3 %   Monocytes Absolute 0.3 0.1 - 1.0  K/uL   Eosinophils Relative 1 %   Eosinophils Absolute 0.1 0.0 - 0.5 K/uL   Basophils Relative 1 %   Basophils Absolute 0.1 0.0 - 0.1 K/uL   Immature Granulocytes 1 %   Abs Immature Granulocytes 0.11 (H) 0.00 - 0.07 K/uL  Basic metabolic panel  Result Value Ref Range   Sodium 136 135 - 145 mmol/L   Potassium 4.3 3.5 - 5.1 mmol/L   Chloride 102 98 - 111 mmol/L   CO2 23 22 - 32 mmol/L   Glucose, Bld 140 (H) 70 - 99 mg/dL   BUN 12 6 - 20 mg/dL   Creatinine, Ser 0.82 0.44 - 1.00 mg/dL   Calcium 9.1 8.9 - 10.3 mg/dL   GFR calc non Af Amer >60 >60 mL/min   GFR calc Af Amer >60 >60 mL/min   Anion gap 11 5 - 15  Brain natriuretic peptide  Result Value Ref Range   B Natriuretic Peptide 48.0 0.0 - 100.0 pg/mL  Blood gas, venous  Result Value Ref Range   FIO2 0.28    Delivery systems NASAL CANNULA    pH, Ven 7.47 (H) 7.250 - 7.430   pCO2, Ven 32 (L) 44.0 - 60.0 mmHg   pO2, Ven 48.0 (H) 32.0 - 45.0 mmHg   Bicarbonate 23.3 20.0 - 28.0 mmol/L   Acid-Base Excess 0.3 0.0 - 2.0 mmol/L   O2 Saturation 86.2 %   Patient temperature 37.0    Collection site VENOUS    Sample type VENOUS       Assessment & Plan:   Problem List Items Addressed This Visit       Cardiovascular and Mediastinum   Hypertension associated with diabetes ([place])    Chronic, stable with BP below goal.  Continue current medication regimen and adjust as needed.  May consider switch to Losartan in future and d/c Lisinopril due to her underlying COPD.  CMP today.      Aortic atherosclerosis (HCC)    Chronic, ongoing noted on lung CT screening.  Continue statin and Plavix daily for prevention.  Continue collaboration with vascular.      Type 2 diabetes, controlled, with peripheral circulatory disorder (HCC)    Chronic, ongoing with A1C today 7.0%.  Urine ALB 30 and A:C <30.  Continue current medication regimen + checking BS at home daily.  Monitor diet at home and reduce carbs and foods high in glucose.  Return in 3 months.      Relevant Orders   Bayer DCA Hb A1c Waived   Microalbumin, Urine Waived here     Respiratory   COPD exacerbation ([place]) - Primary    Acute and improved at this time.  Will continue current inhaler regimen at this time and plan for spirometry next visit.  May benefit from change to Trelegy vs using two inhalers daily, will discuss with CCM PharmD.  Consider pulmonary referral if worsening COPD or frequent exacerbation.      Relevant Medications   albuterol (VENTOLIN HFA) 108 (90 Base) MCG/ACT inhaler     Digestive   GERD (gastroesophageal reflux disease)    Chronic, ongoing.  Continue Protonix daily and collaboration with ENT.  Mag level today.      Relevant Orders   Magnesium     Endocrine   Hyperlipidemia associated with type 2 diabetes mellitus (HCC)    Chronic, ongoing.  Continue current medication regimen and adjust as needed.  Lipid and CMP today.      Relevant Orders  Comprehensive metabolic panel   Lipid Panel w/o Chol/HDL Ratio out   Bayer DCA Hb A1c Waived     Nervous and Auditory   Nicotine dependence, cigarettes, w unsp disorders    I have recommended complete cessation of tobacco use.  Continue Wellbutrin and consider  patches in future, discussed with patient [place] Hotline.        Other Visit Diagnoses    Encounter for screening for HIV       HIV blood work today   Relevant Orders   HIV antibody (with reflex)   Need for hepatitis C screening test       Hep C blood work today   Relevant Orders   Hepatitis C Antibody      Time: 25 minutes, >50% spent counseling on COPD and smoking cessation (5 minutes on this topic)   Follow up plan: Return in about 3 months (around 05/14/2019) for T2DM, HTN/HLD, GERD, COPD --- spirometry need.

## 2019-02-11 NOTE — Patient Instructions (Signed)
Eating Plan for Chronic Obstructive Pulmonary Disease Chronic obstructive pulmonary disease (COPD) causes symptoms such as shortness of breath, coughing, and chest discomfort. These symptoms can make it difficult to eat enough to maintain a healthy weight. Generally, people with COPD should eat a diet that is high in calories, protein, and other nutrients to maintain body weight and to keep the lungs as healthy as possible. Depending on the medicines you take and other health conditions you may have, your health care provider may give you additional recommendations on what to eat or avoid. Talk with your health care provider about your goals for body weight, and work with a dietitian to develop an eating plan that is right for you. What are tips for following this plan? Reading food labels   Avoid foods with more than 300 milligrams (mg) of salt (sodium) per serving.  Choose foods that contain at least 4 grams (g) of fiber per serving. Try to eat 20-30 g of fiber each day.  Choose foods that are high in calories and protein, such as nuts, beans, yogurt, and cheese. Shopping  Do not buy foods labeled as diet, low-calorie, or low-fat.  If you are able to eat dairy products: ? Avoid low-fat or skim milk. ? Buy dairy products that have at least 2% fat.  Buy nutritional supplement drinks.  Buy grains and prepared foods labeled as enriched or fortified.  Consider buying low-sodium, pre-made foods to conserve energy for eating. Cooking  Add dry milk or protein powder to smoothies.  Cook with healthy fats, such as olive oil, canola oil, sunflower oil, and grapeseed oil.  Add oil, butter, cream cheese, or nut butters to foods to increase fat and calories.  To make foods easier to chew and swallow: ? Cook vegetables, pasta, and rice until soft. ? Cut or grind meat into very small pieces. ? Dip breads in liquid. Meal planning   Eat when you feel hungry.  Eat 5-6 small meals throughout  the day.  Drink 6-8 glasses of water each day.  Do not drink liquids with meals. Drink liquids at the end of the meal to avoid feeling full too quickly.  Eat a variety of fruits and vegetables every day.  Ask for assistance from family or friends with planning and preparing meals as needed.  Avoid foods that cause you to feel bloated, such as carbonated drinks, fried foods, beans, broccoli, cabbage, and apples.  For older adults, ask your local agency on aging whether you are eligible for meal assistance programs, such as Meals on Wheels. Lifestyle   Do not smoke.  Eat slowly. Take small bites and chew food well before swallowing.  Do not overeat. This may make it more difficult to breathe after eating.  Sit up while eating.  If needed, continue to use supplemental oxygen while eating.  Rest or relax for 30 minutes before and after eating.  Monitor your weight as told by your health care provider.  Exercise as told by your health care provider. What foods can I eat? Fruits All fresh, dried, canned, or frozen fruits that do not cause gas. Vegetables All fresh, canned (no salt added), or frozen vegetables that do not cause gas. Grains Whole grain bread. Enriched whole grain pasta. Fortified whole grain cereals. Fortified rice. Quinoa. Meats and other proteins Lean meat. Poultry. Fish. Dried beans. Unsalted nuts. Tofu. Eggs. Nut butters. Dairy Whole or 2% milk. Cheese. Yogurt. Fats and oils Olive oil. Canola oil. Butter. Margarine. Beverages Water. Vegetable   juice (no salt added). Decaffeinated coffee. Decaffeinated or herbal tea. Seasonings and condiments Fresh or dried herbs. Low-salt or salt-free seasonings. Low-sodium soy sauce. The items listed above may not be a complete list of foods and beverages you can eat. Contact a dietitian for more information. What foods are not recommended? Fruits Fruits that cause gas, such as apples or melon. Vegetables Vegetables  that cause gas, such as broccoli, Brussels sprouts, cabbage, cauliflower, and onions. Canned vegetables with added salt. Meats and other proteins Fried meat. Salt-cured meat. Processed meat. Dairy Fat-free or low-fat milk, yogurt, or cheese. Processed cheese. Beverages Carbonated drinks. Caffeinated drinks, such as coffee, tea, and soft drinks. Juice. Alcohol. Vegetable juice with added salt. Seasonings and condiments Salt. Seasoning mixes with salt. Soy sauce. Stacey Gross. Other foods Clear soup or broth. Fried foods. Prepared frozen meals. The items listed above may not be a complete list of foods and beverages you should avoid. Contact a dietitian for more information. Summary  COPD symptoms can make it difficult to eat enough to maintain a healthy weight.  A COPD eating plan can help you maintain your body weight and keep your lungs as healthy as possible.  Eat a diet that is high in calories, protein, and other nutrients. Read labels to make sure that you are getting the right nutrients. Cook foods to make them easy to chew and swallow.  Eat 5-6 small meals throughout the day, and avoid foods that cause gas or make you feel bloated. This information is not intended to replace advice given to you by your health care provider. Make sure you discuss any questions you have with your health care provider. Document Released: 06/06/2017 Document Revised: 07/12/2018 Document Reviewed: 06/06/2017 Elsevier Patient Education  2020 [employer].

## 2019-02-11 NOTE — Assessment & Plan Note (Signed)
Chronic, stable with BP below goal.  Continue current medication regimen and adjust as needed.  May consider switch to Losartan in future and d/c Lisinopril due to her underlying COPD.  CMP today.

## 2019-02-12 ENCOUNTER — Other Ambulatory Visit: Payer: Self-pay

## 2019-02-12 ENCOUNTER — Other Ambulatory Visit: Payer: Self-pay | Admitting: Nurse Practitioner

## 2019-02-12 DIAGNOSIS — E785 Hyperlipidemia, unspecified: Secondary | ICD-10-CM

## 2019-02-12 DIAGNOSIS — E1169 Type 2 diabetes mellitus with other specified complication: Secondary | ICD-10-CM

## 2019-02-12 LAB — COMPREHENSIVE METABOLIC PANEL
ALT: 17 IU/L (ref 0–32)
AST: 16 IU/L (ref 0–40)
Albumin/Globulin Ratio: 2 (ref 1.2–2.2)
Albumin: 4.1 g/dL (ref 3.8–4.9)
Alkaline Phosphatase: 105 IU/L (ref 39–117)
BUN/Creatinine Ratio: 12 (ref 12–28)
BUN: 12 mg/dL (ref 8–27)
Bilirubin Total: 0.3 mg/dL (ref 0.0–1.2)
CO2: 23 mmol/L (ref 20–29)
Calcium: 9 mg/dL (ref 8.7–10.3)
Chloride: 99 mmol/L (ref 96–106)
Creatinine, Ser: 1.01 mg/dL — ABNORMAL HIGH (ref 0.57–1.00)
GFR calc Af Amer: 70 mL/min/{1.73_m2} (ref >59)
GFR calc non Af Amer: 61 mL/min/{1.73_m2} (ref >59)
Globulin, Total: 2.1 g/dL (ref 1.5–4.5)
Glucose: 205 mg/dL — ABNORMAL HIGH (ref 65–99)
Potassium: 5.1 mmol/L (ref 3.5–5.2)
Sodium: 136 mmol/L (ref 134–144)
Total Protein: 6.2 g/dL (ref 6.0–8.5)

## 2019-02-12 LAB — LIPID PANEL W/O CHOL/HDL RATIO
Cholesterol, Total: 174 mg/dL (ref 100–199)
HDL: 41 mg/dL (ref >39)
LDL Chol Calc (NIH): 92 mg/dL (ref 0–99)
Triglycerides: 243 mg/dL — ABNORMAL HIGH (ref 0–149)
VLDL Cholesterol Cal: 41 mg/dL — ABNORMAL HIGH (ref 5–40)

## 2019-02-12 LAB — MAGNESIUM: Magnesium: 1.8 mg/dL (ref 1.6–2.3)

## 2019-02-12 LAB — HIV ANTIBODY (ROUTINE TESTING W REFLEX): HIV Screen 4th Generation wRfx: NONREACTIVE

## 2019-02-12 LAB — HEPATITIS C ANTIBODY: Hep C Virus Ab: 0.1 s/co ratio (ref 0.0–0.9)

## 2019-02-12 MED ORDER — ATORVASTATIN CALCIUM 40 MG PO TABS: 40.0000 mg | ORAL_TABLET | Freq: Every day | ORAL | 3 refills | Status: DC

## 2019-02-12 NOTE — Patient Outreach (Signed)
[facility]) Care Management  02/12/2019  Stacey Gross 1960/04/28 [id number]   Medication Adherence call to Mrs. Stacey Gross [facility] Ident[employer] Verify spoke with patient she is past due on Ozempic,patient explain she injects this medication every Thursday and is up to date on taking her medication,patient wants to order this medication her self,patient explain she can walk to the pharmacy and does not have to wait to have it ready for her. Stacey Gross is showing past due under [facility] ins.   [place] Management Direct Dial [phone]  Fax [phone] [email or web address]

## 2019-02-17 DIAGNOSIS — J449 Chronic obstructive pulmonary disease, unspecified: Secondary | ICD-10-CM | POA: Diagnosis not present

## 2019-02-26 DIAGNOSIS — M1712 Unilateral primary osteoarthritis, left knee: Secondary | ICD-10-CM | POA: Diagnosis not present

## 2019-03-12 ENCOUNTER — Other Ambulatory Visit: Payer: Medicare Other

## 2019-03-12 ENCOUNTER — Other Ambulatory Visit: Payer: Self-pay

## 2019-03-19 DIAGNOSIS — J449 Chronic obstructive pulmonary disease, unspecified: Secondary | ICD-10-CM | POA: Diagnosis not present

## 2019-04-03 ENCOUNTER — Telehealth (INDEPENDENT_AMBULATORY_CARE_PROVIDER_SITE_OTHER): Payer: Self-pay | Admitting: Vascular Surgery

## 2019-04-03 NOTE — Telephone Encounter (Signed)
Patient was made aware with medical advice from message below and stated that she will go to the ED or urgent care to evaluated

## 2019-04-03 NOTE — Telephone Encounter (Signed)
Unfortunately the [place] technicians have left for today, so we wouldn't be able to do any studies in office.  Because we will be closed Thursday and Friday, I would strongly suggest that she go to the ED/urgent care for evaluation. However, if the patient chooses not to go for evaluation, we can bring her into the office next week with ABIs

## 2019-04-07 ENCOUNTER — Other Ambulatory Visit: Payer: Self-pay | Admitting: Orthopedic Surgery

## 2019-04-09 ENCOUNTER — Ambulatory Visit: Payer: Self-pay | Admitting: Nurse Practitioner

## 2019-04-11 ENCOUNTER — Inpatient Hospital Stay: Admission: RE | Admit: 2019-04-11 | Payer: Medicare Other | Source: Ambulatory Visit

## 2019-04-11 DIAGNOSIS — J381 Polyp of vocal cord and larynx: Secondary | ICD-10-CM | POA: Diagnosis not present

## 2019-04-11 DIAGNOSIS — R1314 Dysphagia, pharyngoesophageal phase: Secondary | ICD-10-CM | POA: Diagnosis not present

## 2019-04-11 DIAGNOSIS — H73002 Acute myringitis, left ear: Secondary | ICD-10-CM | POA: Diagnosis not present

## 2019-04-11 DIAGNOSIS — K219 Gastro-esophageal reflux disease without esophagitis: Secondary | ICD-10-CM | POA: Diagnosis not present

## 2019-04-19 DIAGNOSIS — J449 Chronic obstructive pulmonary disease, unspecified: Secondary | ICD-10-CM | POA: Diagnosis not present

## 2019-04-22 ENCOUNTER — Other Ambulatory Visit: Payer: Medicare Other

## 2019-04-24 ENCOUNTER — Inpatient Hospital Stay: Admit: 2019-04-24 | Payer: Medicare Other | Admitting: Orthopedic Surgery

## 2019-04-24 SURGERY — ARTHROPLASTY, SHOULDER, TOTAL
Anesthesia: General | Site: Shoulder | Laterality: Right

## 2019-05-14 ENCOUNTER — Encounter: Payer: Self-pay | Admitting: Nurse Practitioner

## 2019-05-14 ENCOUNTER — Ambulatory Visit (INDEPENDENT_AMBULATORY_CARE_PROVIDER_SITE_OTHER): Payer: Medicare Other | Admitting: Vascular Surgery

## 2019-05-14 ENCOUNTER — Encounter (INDEPENDENT_AMBULATORY_CARE_PROVIDER_SITE_OTHER): Payer: Self-pay

## 2019-05-14 ENCOUNTER — Other Ambulatory Visit (INDEPENDENT_AMBULATORY_CARE_PROVIDER_SITE_OTHER): Payer: Self-pay | Admitting: Vascular Surgery

## 2019-05-14 ENCOUNTER — Other Ambulatory Visit: Payer: Self-pay

## 2019-05-14 ENCOUNTER — Encounter (INDEPENDENT_AMBULATORY_CARE_PROVIDER_SITE_OTHER): Payer: Self-pay | Admitting: Vascular Surgery

## 2019-05-14 ENCOUNTER — Other Ambulatory Visit (INDEPENDENT_AMBULATORY_CARE_PROVIDER_SITE_OTHER): Payer: Self-pay | Admitting: Nurse Practitioner

## 2019-05-14 ENCOUNTER — Ambulatory Visit (INDEPENDENT_AMBULATORY_CARE_PROVIDER_SITE_OTHER): Payer: Medicare Other | Admitting: Nurse Practitioner

## 2019-05-14 ENCOUNTER — Ambulatory Visit (INDEPENDENT_AMBULATORY_CARE_PROVIDER_SITE_OTHER): Payer: Medicare Other

## 2019-05-14 VITALS — BP 106/73 | HR 84 | Temp 98.3°F

## 2019-05-14 VITALS — BP 109/72 | HR 86 | Resp 18 | Wt 214.0 lb

## 2019-05-14 DIAGNOSIS — E1151 Type 2 diabetes mellitus with diabetic peripheral angiopathy without gangrene: Secondary | ICD-10-CM

## 2019-05-14 DIAGNOSIS — I10 Essential (primary) hypertension: Secondary | ICD-10-CM

## 2019-05-14 DIAGNOSIS — E1169 Type 2 diabetes mellitus with other specified complication: Secondary | ICD-10-CM

## 2019-05-14 DIAGNOSIS — K219 Gastro-esophageal reflux disease without esophagitis: Secondary | ICD-10-CM

## 2019-05-14 DIAGNOSIS — E538 Deficiency of other specified B group vitamins: Secondary | ICD-10-CM

## 2019-05-14 DIAGNOSIS — E785 Hyperlipidemia, unspecified: Secondary | ICD-10-CM

## 2019-05-14 DIAGNOSIS — I75029 Atheroembolism of unspecified lower extremity: Secondary | ICD-10-CM | POA: Diagnosis not present

## 2019-05-14 DIAGNOSIS — E1159 Type 2 diabetes mellitus with other circulatory complications: Secondary | ICD-10-CM

## 2019-05-14 DIAGNOSIS — I70213 Atherosclerosis of native arteries of extremities with intermittent claudication, bilateral legs: Secondary | ICD-10-CM

## 2019-05-14 DIAGNOSIS — J432 Centrilobular emphysema: Secondary | ICD-10-CM | POA: Insufficient documentation

## 2019-05-14 DIAGNOSIS — F17219 Nicotine dependence, cigarettes, with unspecified nicotine-induced disorders: Secondary | ICD-10-CM

## 2019-05-14 DIAGNOSIS — I75023 Atheroembolism of bilateral lower extremities: Secondary | ICD-10-CM | POA: Diagnosis not present

## 2019-05-14 LAB — MICROALBUMIN, URINE WAIVED
Creatinine, Urine Waived: 300 mg/dL (ref 10–300)
Microalb, Ur Waived: 150 mg/L — ABNORMAL HIGH (ref 0–19)

## 2019-05-14 LAB — BAYER DCA HB A1C WAIVED: HB A1C (BAYER DCA - WAIVED): 6.3 % (ref <7.0)

## 2019-05-14 MED ORDER — ALBUTEROL SULFATE HFA 108 (90 BASE) MCG/ACT IN AERS: INHALATION_SPRAY | RESPIRATORY_TRACT | 5 refills | Status: DC

## 2019-05-14 MED ORDER — TRELEGY ELLIPTA 100-62.5-25 MCG/INH IN AEPB: 1.0000 | INHALATION_SPRAY | Freq: Every day | RESPIRATORY_TRACT | 3 refills | Status: DC

## 2019-05-14 MED ORDER — LOSARTAN POTASSIUM 25 MG PO TABS: 25.0000 mg | ORAL_TABLET | Freq: Every day | ORAL | 3 refills | Status: DC

## 2019-05-14 MED ORDER — SARNA 0.5-0.5 % EX LOTN: 1.0000 "application " | TOPICAL_LOTION | CUTANEOUS | 0 refills | Status: DC | PRN

## 2019-05-14 NOTE — Assessment & Plan Note (Signed)
Chronic, ongoing.  Continue current medication regimen and adjust as needed.  Lipid and CMP today. 

## 2019-05-14 NOTE — Assessment & Plan Note (Signed)
Chronic, ongoing.  Will trial change to Trelegy if covered by insurance, script sent and patient to notify provider if issue obtaining.  If issue will reach out to [facility] team for assist.  If no issue will discontinue Symbicort and Spiriva.  Recommend complete cessation of smoking.  Continue annual CT scans.

## 2019-05-14 NOTE — Assessment & Plan Note (Signed)
I have recommended complete cessation of tobacco use. I have discussed various options available for assistance with tobacco cessation including over the counter methods (Nicotine gum, patch and lozenges). We also discussed prescription options (Chantix, Nicotine Inhaler / Nasal Spray). The patient is not interested in pursuing any prescription tobacco cessation options at this time.  

## 2019-05-14 NOTE — Assessment & Plan Note (Signed)
Her noninvasive studies show completely normal ABIs with triphasic waveforms and no stenoses or unstable plaques in either lower extremity.  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  We can come off of the Plavix as it has not helped._Plan on seeing her on an annual basis at this point.

## 2019-05-14 NOTE — Progress Notes (Signed)
BP 106/73   Pulse 84   Temp 98.3 F (36.8 C) (Oral)   SpO2 94%    Subjective:    Patient ID: Stacey Gross, female    DOB: [birth year], 59 y.o.   MRN: [id number]  HPI: Stacey Gross is a 59 y.o. female  Chief Complaint  Patient presents with  . Diabetes  . Hyperlipidemia  . Hypertension  . Gastroesophageal Reflux  . COPD   DIABETES A1C last in November 7.0%.  Continues on Metformin 500 MG BID and Ozempic 0.5MG  weekly. Hypoglycemic episodes:no Polydipsia/polyuria: no Visual disturbance: no Chest pain: no Paresthesias: no Glucose Monitoring: yes             Accucheck frequency: Daily             Fasting glucose: yesterday morning 98 and 114 this morning, 110 average             Post prandial:             Evening:             Before meals: Taking Insulin?: no             Long acting insulin:             Short acting insulin: Blood Pressure Monitoring: not checking Retinal Examination: Not up to Date Foot Exam: Up to Date Pneumovax: Up to Date Influenza: Up to Date Aspirin: no   HYPERTENSION / HYPERLIPIDEMIA Continues on Atorvastatin and Lisinopril.  Has been on Lisinopril for some time.  Is followed by vascular for her blue toe syndrome and was started on Pletal last visit, which she reports was not helpful.  Has visit with them this afternoon. Satisfied with current treatment? yes Duration of hypertension: chronic BP monitoring frequency: not checking BP range:  BP medication side effects: no Duration of hyperlipidemia: chronic Cholesterol medication side effects: no Cholesterol supplements: none Medication compliance: good compliance Aspirin: no Recent stressors: no Recurrent headaches: no Visual changes: no Palpitations: no Dyspnea: no Chest pain: no Lower extremity edema: no Dizzy/lightheaded: no   GERD Continues on Protonix.  Seen by GI in August with endoscopy done for dysphagia, negative for malignancy.  Has also been seen by ENT recently.  She returns in January and may be having biopsy at that time.  She reports Protonix does not work well, but is taking it at night only.  Does endorse GERD is worse during day, she does continue to smoke and drink coffee often. GERD control status: stable  Satisfied with current treatment? yes Heartburn frequency:  Medication side effects: no  Medication compliance: stable Previous GERD medications: Prilosec Alleviatiating factors:  Protonix Aggravating factors: certain foods Dysphagia: intermittent, followed by GI and ENT recently Odynophagia:  no Hematemesis: no Blood in stool: no EGD: yes, recent  COPD Continues to smoke 1 PPD.  Went for lung CT screening February 2020.  Current medications include Symbicort and Albuterol, is not using Spiriva.  Discussed trial of Trelegy, which has all three medications in it and she can use once a day.  She is interested in this.   Last exacerbation 02/11/2019.  COPD status: stable Satisfied with current treatment?: yes Oxygen use: no Dyspnea frequency:  Cough frequency:  Rescue inhaler frequency:   Limitation of activity: no Productive cough:  Last Spirometry: unknown Pneumovax: Up to Date Influenza: Up to Date   Relevant past medical, surgical, family and social history reviewed and updated as indicated. Interim medical history since  our last visit reviewed. Allergies and medications reviewed and updated.  Review of Systems  Constitutional: Negative for activity change, appetite change, diaphoresis, fatigue and fever.  Respiratory: Negative for cough, chest tightness, shortness of breath and wheezing.   Cardiovascular: Negative for chest pain, palpitations and leg swelling.  Gastrointestinal: Negative.   Endocrine: Negative for cold intolerance, heat intolerance, polydipsia, polyphagia and polyuria.  Neurological: Negative.   Psychiatric/Behavioral: Negative.     Per HPI unless specifically indicated above     Objective:    BP  106/73   Pulse 84   Temp 98.3 F (36.8 C) (Oral)   SpO2 94%   Wt Readings from Last 3 Encounters:  02/11/19 224 lb (101.6 kg)  02/06/19 225 lb (102.1 kg)  02/01/19 220 lb (99.8 kg)    Physical Exam Vitals and nursing note reviewed.  Constitutional:      General: She is awake. She is not in acute distress.    Appearance: She is well-developed. She is obese. She is not ill-appearing.  HENT:     Head: Normocephalic.     Right Ear: Hearing normal.     Left Ear: Hearing normal.  Eyes:     General: Lids are normal.        Right eye: No discharge.        Left eye: No discharge.     Conjunctiva/sclera: Conjunctivae normal.     Pupils: Pupils are equal, round, and reactive to light.  Neck:     Vascular: No carotid bruit.  Cardiovascular:     Rate and Rhythm: Normal rate and regular rhythm.     Heart sounds: Normal heart sounds. No murmur. No gallop.   Pulmonary:     Effort: Pulmonary effort is normal. No accessory muscle usage or respiratory distress.     Breath sounds: Normal breath sounds.  Abdominal:     General: Bowel sounds are normal.     Palpations: Abdomen is soft.     Tenderness: There is no abdominal tenderness.  Musculoskeletal:     Cervical back: Normal range of motion and neck supple.     Right lower leg: No edema.     Left lower leg: No edema.  Skin:    General: Skin is warm and dry.  Neurological:     Mental Status: She is alert and oriented to person, place, and time.  Psychiatric:        Attention and Perception: Attention normal.        Mood and Affect: Mood normal.        Speech: Speech normal.        Behavior: Behavior normal. Behavior is cooperative.        Thought Content: Thought content normal.      Diabetic Foot Exam - Simple   Simple Foot Form Visual Inspection See comments: Yes Sensation Testing Intact to touch and monofilament testing bilaterally: Yes Pulse Check See comments: Yes Comments 1+ DP and PT bilateral feet.  Sensation  intact.  Blue toes bilateral feet (known), skin intact and dry.      Results for orders placed or performed in visit on 02/11/19  Comprehensive metabolic panel  Result Value Ref Range   Glucose 205 (H) 65 - 99 mg/dL   BUN 12 8 - 27 mg/dL   Creatinine, Ser 1.01 (H) 0.57 - 1.00 mg/dL   GFR calc non Af Amer 61 >59 mL/min/1.73   GFR calc Af Amer 70 >59 mL/min/1.73   BUN/Creatinine Ratio 12  12 - 28   Sodium 136 134 - 144 mmol/L   Potassium 5.1 3.5 - 5.2 mmol/L   Chloride 99 96 - 106 mmol/L   CO2 23 20 - 29 mmol/L   Calcium 9.0 8.7 - 10.3 mg/dL   Total Protein 6.2 6.0 - 8.5 g/dL   Albumin 4.1 3.8 - 4.9 g/dL   Globulin, Total 2.1 1.5 - 4.5 g/dL   Albumin/Globulin Ratio 2.0 1.2 - 2.2   Bilirubin Total 0.3 0.0 - 1.2 mg/dL   Alkaline Phosphatase 105 39 - 117 IU/L   AST 16 0 - 40 IU/L   ALT 17 0 - 32 IU/L  Lipid Panel w/o Chol/HDL Ratio out  Result Value Ref Range   Cholesterol, Total 174 100 - 199 mg/dL   Triglycerides 243 (H) 0 - 149 mg/dL   HDL 41 >39 mg/dL   VLDL Cholesterol Cal 41 (H) 5 - 40 mg/dL   LDL Chol Calc (NIH) 92 0 - 99 mg/dL  Bayer DCA Hb A1c Waived  Result Value Ref Range   HB A1C (BAYER DCA - WAIVED) 7.0 (H) <7.0 %  Magnesium  Result Value Ref Range   Magnesium 1.8 1.6 - 2.3 mg/dL  Microalbumin, Urine Waived here  Result Value Ref Range   Microalb, Ur Waived 30 (H) 0 - 19 mg/L   Creatinine, Urine Waived 200 10 - 300 mg/dL   Microalb/Creat Ratio <30 <30 mg/g  HIV antibody (with reflex)  Result Value Ref Range   HIV Screen 4th Generation wRfx Non Reactive Non Reactive  Hepatitis C Antibody  Result Value Ref Range   Hep C Virus Ab 0.1 0.0 - 0.9 s/co ratio      Assessment & Plan:   Problem List Items Addressed This Visit      Cardiovascular and Mediastinum   Hypertension associated with diabetes (HCC)    Chronic, stable with BP below goal.  Will switch to Losartan and d/c Lisinopril due to her underlying COPD, may benefit from change and this will allow for  ongoing kidney protection in diabetes.  Recommend she monitor BP at home.  CMP today.      Relevant Medications   losartan (COZAAR) 25 MG tablet   Other Relevant Orders   Bayer DCA Hb A1c Waived   Blue toe syndrome (HCC)    Chronic, ongoing.  Continue collaboration with vascular.      Relevant Medications   losartan (COZAAR) 25 MG tablet   Type 2 diabetes, controlled, with peripheral circulatory disorder (HCC) - Primary    Chronic, ongoing with A1C today 6.3%.  Urine ALB 150 and A:C 30-300.  Continue current medication regimen + checking BS at home daily.  Monitor diet at home and reduce carbs and foods high in glucose.  Losartan for kidney protection.  Return in 3 months.      Relevant Medications   losartan (COZAAR) 25 MG tablet   Other Relevant Orders   Bayer DCA Hb A1c Waived   Microalbumin, Urine Waived   Comprehensive metabolic panel     Respiratory   Centrilobular emphysema (HCC)    Chronic, ongoing.  Will trial change to Trelegy if covered by insurance, script sent and patient to notify provider if issue obtaining.  If issue will reach out to [facility] team for assist.  If no issue will discontinue Symbicort and Spiriva.  Recommend complete cessation of smoking.  Continue annual CT scans.        Relevant Medications   Fluticasone-Umeclidin-Vilant (TRELEGY ELLIPTA)  100-62.5-25 MCG/INH AEPB   albuterol (VENTOLIN HFA) 108 (90 Base) MCG/ACT inhaler     Digestive   GERD (gastroesophageal reflux disease)    Ongoing, recommend she take Protonix in morning and cut back on foods/drinks that irritate reflux + cut back on smoking.  Continue collaboration with ENT.        Endocrine   Hyperlipidemia associated with type 2 diabetes mellitus (HCC)    Chronic, ongoing.  Continue current medication regimen and adjust as needed.  Lipid and CMP today.      Relevant Medications   losartan (COZAAR) 25 MG tablet   Other Relevant Orders   Bayer DCA Hb A1c Waived   Comprehensive metabolic  panel   Lipid Panel w/o Chol/HDL Ratio     Nervous and Auditory   Nicotine dependence, cigarettes, w unsp disorders    I have recommended complete cessation of tobacco use. I have discussed various options available for assistance with tobacco cessation including over the counter methods (Nicotine gum, patch and lozenges). We also discussed prescription options (Chantix, Nicotine Inhaler / Nasal Spray). The patient is not interested in pursuing any prescription tobacco cessation options at this time.        Other Visit Diagnoses    B12 deficiency       reports history of low level, recheck today and start supplement as needed.   Relevant Orders   Vitamin B12       Follow up plan: Return in about 3 months (around 08/11/2019) for T2DM, HTN/HLD, COPD, GERD.

## 2019-05-14 NOTE — Assessment & Plan Note (Signed)
Chronic, ongoing with A1C today 6.3%.  Urine ALB 150 and A:C 30-300.  Continue current medication regimen + checking BS at home daily.  Monitor diet at home and reduce carbs and foods high in glucose.  Losartan for kidney protection.  Return in 3 months.

## 2019-05-14 NOTE — Patient Instructions (Signed)

## 2019-05-14 NOTE — Assessment & Plan Note (Signed)
Ongoing, recommend she take Protonix in morning and cut back on foods/drinks that irritate reflux + cut back on smoking.  Continue collaboration with ENT.

## 2019-05-14 NOTE — Assessment & Plan Note (Signed)
Chronic, stable with BP below goal.  Will switch to Losartan and d/c Lisinopril due to her underlying COPD, may benefit from change and this will allow for ongoing kidney protection in diabetes.  Recommend she monitor BP at home.  CMP today.

## 2019-05-14 NOTE — Assessment & Plan Note (Signed)
Chronic, ongoing.  Continue collaboration with vascular. 

## 2019-05-14 NOTE — Progress Notes (Signed)
MRN : [id number]  Stacey Gross is a 59 y.o. (1960-12-20) female who presents with chief complaint of  Chief Complaint  Patient presents with  . Follow-up    3 month LE arterial duplex   .  History of Present Illness: Patient returns today in follow up of foot discoloration and pain.  Plavix did not help this.  Her noninvasive studies show completely normal ABIs with triphasic waveforms and no stenoses or unstable plaques in either lower extremity.  She does have longstanding diabetes.  Current Outpatient Medications  Medication Sig Dispense Refill  . albuterol (PROVENTIL) (2.5 MG/3ML) 0.083% nebulizer solution Take 2.5 mg by nebulization every 6 (six) hours as needed for wheezing or shortness of breath.     Marland Kitchen albuterol (VENTOLIN HFA) 108 (90 Base) MCG/ACT inhaler INHALE 2 PUFFS BY MOUTH INTO THE LUNGS EVERY 6 HOURS AS NEEDED FOR WHEEZING OR SHORTNESS OF BREATH 8.5 g 5  . atorvastatin (LIPITOR) 40 MG tablet Take 1 tablet (40 mg total) by mouth daily. 90 tablet 3  . Blood Glucose Monitoring Suppl (ONE TOUCH ULTRA SYSTEM KIT) W/DEVICE KIT 1 kit by Does not apply route once. (Patient taking differently: 1 kit by Does not apply route daily. ) 100 each 12  . budesonide-formoterol (SYMBICORT) 160-4.5 MCG/ACT inhaler Inhale 2 puffs into the lungs 2 (two) times daily. 1 Inhaler 3  . camphor-menthol (SARNA) lotion Apply 1 application topically as needed for itching. 222 mL 0  . cilostazol (PLETAL) 100 MG tablet Take 1 tablet (100 mg total) by mouth 2 (two) times daily before a meal. 60 tablet 3  . clopidogrel (PLAVIX) 75 MG tablet TAKE 1 TABLET BY MOUTH DAILY (Patient taking differently: Take 75 mg by mouth daily. ) 30 tablet 4  . cyclobenzaprine (FLEXERIL) 10 MG tablet TAKE ONE TABLET BY MOUTH AT BEDTIME (Patient taking differently: Take 10 mg by mouth at bedtime as needed for muscle spasms. ) 30 tablet 3  . docusate sodium (COLACE) 100 MG capsule Take 100 mg by mouth daily as needed for mild  constipation.    . Fluticasone-Umeclidin-Vilant (TRELEGY ELLIPTA) 100-62.5-25 MCG/INH AEPB Inhale 1 puff into the lungs daily. 60 each 3  . glucose blood test strip Use to check blood sugar three times a day. 200 each 12  . losartan (COZAAR) 25 MG tablet Take 1 tablet (25 mg total) by mouth daily. 90 tablet 3  . metFORMIN (GLUCOPHAGE) 500 MG tablet Take 1 tablet (500 mg total) by mouth 2 (two) times daily with a meal. 180 tablet 3  . pantoprazole (PROTONIX) 40 MG tablet Take 1 tablet (40 mg total) by mouth daily. 30 tablet 3  . Semaglutide,0.25 or 0.5MG/DOS, (OZEMPIC, 0.25 OR 0.5 MG/DOSE,) 2 MG/1.5ML SOPN Inject 0.5 mg into the skin once a week. 3 pen 5  . traZODone (DESYREL) 100 MG tablet Take 1 tablet (100 mg total) by mouth at bedtime as needed for sleep. 90 tablet 3  . meloxicam (MOBIC) 15 MG tablet Take 1 tablet (15 mg total) by mouth daily. (Patient not taking: Reported on 04/02/2019) 30 tablet 3  . Tiotropium Bromide Monohydrate (SPIRIVA RESPIMAT) 2.5 MCG/ACT AERS Inhale 2 puffs into the lungs daily. (Patient not taking: Reported on 04/02/2019) 1 Inhaler 3   No current facility-administered medications for this visit.    Past Medical History:  Diagnosis Date  . Asthma   . Cancer ([place])    stomach  . Chronic back pain   . COPD (chronic obstructive pulmonary disease) (  [place])   . Degenerative joint disease (DJD) of hip   . Diabetes mellitus without complication ([place])   . Dyspnea   . GERD (gastroesophageal reflux disease)    takes Omeprazole daily  . History of bronchitis yrs ago  . History of colon polyps    benign  . History of staph infection 10 yrs ago  . Hyperlipidemia    takes Atorvastatin daily  . Hypertension   . Peripheral vascular disease ([place])   . Tobacco use    8 cigarettes daily   . Weakness    numbness and tingling in both legs     Past Surgical History:  Procedure Laterality Date  . ABDOMINAL HYSTERECTOMY     total  . APPENDECTOMY    . BACK SURGERY  9-10  yrs ago  . BREAST BIOPSY Right    Core- neg  . COLONOSCOPY    . COLONOSCOPY WITH PROPOFOL N/A 09/20/2017   Procedure: COLONOSCOPY WITH PROPOFOL;  Surgeon: Virgel Manifold, MD;  Location: [facility] ENDOSCOPY;  Service: Endoscopy;  Laterality: N/A;  . COLONOSCOPY WITH PROPOFOL N/A 04/24/2018   Procedure: COLONOSCOPY WITH PROPOFOL;  Surgeon: Lucilla Lame, MD;  Location: [facility] ENDOSCOPY;  Service: Endoscopy;  Laterality: N/A;  . ESOPHAGOGASTRODUODENOSCOPY (EGD) WITH PROPOFOL N/A 09/20/2017   Procedure: ESOPHAGOGASTRODUODENOSCOPY (EGD) WITH PROPOFOL;  Surgeon: Virgel Manifold, MD;  Location: [facility] ENDOSCOPY;  Service: Endoscopy;  Laterality: N/A;  . ESOPHAGOGASTRODUODENOSCOPY (EGD) WITH PROPOFOL N/A 11/20/2018   Procedure: ESOPHAGOGASTRODUODENOSCOPY (EGD) WITH PROPOFOL;  Surgeon: Lucilla Lame, MD;  Location: [facility] ENDOSCOPY;  Service: Endoscopy;  Laterality: N/A;  . PERIPHERAL VASCULAR CATHETERIZATION Left 03/17/2016   Procedure: Lower Extremity Angiography;  Surgeon: Algernon Huxley, MD;  Location: [facility];  Service: Cardiovascular;  Laterality: Left;   Family History  Problem Relation Age of Onset  . Hyperlipidemia Mother   . Hyperlipidemia Father   . Cancer Sister     melanoma  No known history of bleeding disorders or clotting disorders.  Familiar with peripheral arterial disease as her husband has had a treated many times  Social History      Social History  Substance Use Topics  . Smoking status: Current Every Day Smoker    Packs/day: 0.50    Years: 43.00    Types: Cigarettes  . Smokeless tobacco: Never Used  . Alcohol use No  No IV drug use      Allergies  Allergen Reactions  . Doxycycline Hyclate Anaphylaxis, Swelling and Rash  . Latex Rash  . Vancomycin Rash      REVIEW OF SYSTEMS(Negative unless checked)  Constitutional: [] ???Weight loss[] ???Fever[] ???Chills Cardiac:[] ???Chest pain[] ???Chest pressure[] ???Palpitations  [] ???Shortness of breath when laying flat [] ???Shortness of breath at rest [] ???Shortness of breath with exertion. Vascular: [x] ???Pain in legs with walking[] ???Pain in legsat rest[] ???Pain in legs when laying flat [x] ???Claudication [] ???Pain in feet when walking [x] ???Pain in feet at rest [] ???Pain in feet when laying flat [] ???History of DVT [] ???Phlebitis [] ???Swelling in legs [] ???Varicose veins [] ???Non-healing ulcers Pulmonary: [] ???Uses home oxygen [] ???Productive cough[] ???Hemoptysis [] ???Wheeze [] ???COPD [] ???Asthma Neurologic: [] ???Dizziness [] ???Blackouts [] ???Seizures [] ???History of stroke [] ???History of TIA[] ???Aphasia [] ???Temporary blindness[] ???Dysphagia [] ???Weaknessor numbness in arms [] ???Weakness or numbnessin legs Musculoskeletal: [] ???Arthritis [] ???Joint swelling [] ???Joint pain [x] ???Low back pain Hematologic:[] ???Easy bruising[] ???Easy bleeding [] ???Hypercoagulable state [] ???Anemic [] ???Hepatitis Gastrointestinal:[] ???Blood in stool[] ???Vomiting blood[] ???Gastroesophageal reflux/heartburn[] ???Abdominal pain Genitourinary: [] ???Chronic kidney disease [] ???Difficulturination [] ???Frequenturination [] ???Burning with urination[] ???Hematuria Skin: [] ???Rashes [] ???Ulcers [] ???Wounds Psychological: [] ???History of anxiety[] ???History of major depression.     Physical Examination  BP 109/72 (BP Location: Left Arm)   Pulse 86  Resp 18   Wt 214 lb (97.1 kg)   BMI 33.52 kg/m  Gen:  WD/WN, NAD Head: [place]/AT, No temporalis wasting. Ear/Nose/Throat: Hearing grossly intact, nares w/o erythema or drainage Eyes: Conjunctiva clear. Sclera non-icteric Neck: Supple.  Trachea midline Pulmonary:  Good air movement, no use of accessory muscles.  Cardiac: RRR, no JVD Vascular:  Vessel Right Left  Radial Palpable Palpable                          PT Palpable  Palpable  DP Palpable Palpable   Gastrointestinal: soft, non-tender/non-distended. No guarding/reflex.  Musculoskeletal: M/S 5/5 throughout.  No deformity or atrophy.  Cyanosis of several toes on both feet.  No significant lower extremity edema. Neurologic: Sensation grossly intact in extremities.  Symmetrical.  Speech is fluent.  Psychiatric: Judgment intact, Mood & affect appropriate for pt's clinical situation. Dermatologic: No rashes or ulcers noted.  No cellulitis or open wounds.       Labs No results found for this or any previous visit (from the past 2160 hour(s)).  Radiology No results found.  Assessment/Plan Hyperlipidemia lipid control important in reducing the progression of atherosclerotic disease. Continue statin therapy   Diabetes blood glucose control important in reducing the progression of atherosclerotic disease. Also, involved in wound healing. On appropriate medications.  Hypertension blood pressure control important in reducing the progression of atherosclerotic disease. On appropriate oral medications.  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  We can come off of the Plavix as it has not helped._Plan on seeing her on an annual basis at this point.    Leotis Pain, MD  05/14/2019 3:58 PM    This note was created with Dragon medical transcription system.  Any errors from dictation are purely unintentional

## 2019-05-15 ENCOUNTER — Ambulatory Visit (INDEPENDENT_AMBULATORY_CARE_PROVIDER_SITE_OTHER): Payer: Medicare Other

## 2019-05-15 DIAGNOSIS — Z0000 Encounter for general adult medical examination without abnormal findings: Secondary | ICD-10-CM | POA: Diagnosis not present

## 2019-05-15 NOTE — Patient Instructions (Signed)
Stacey Gross , Thank you for taking time to come for your Medicare Wellness Visit. I appreciate your ongoing commitment to your health goals. Please review the following plan we discussed and let me know if I can assist you in the future.   Screening recommendations/referrals: Colonoscopy: up to date  Mammogram: Please call [phone] to schedule your mammogram.  Bone Density: not indicated  Recommended yearly ophthalmology/optometry visit for glaucoma screening and checkup Recommended yearly dental visit for hygiene and checkup  Vaccinations: Influenza vaccine: up to date  Pneumococcal vaccine: up to date  Tdap vaccine: up to date Shingles vaccine: up to date    Advanced directives: Please bring a copy of your health care power of attorney and living will to the office at your convenience.  Conditions/risks identified: diabetic, already in contact with chronic care management team . Please schedule diabetic eye exam.    Next appointment: Follow up in one year for your annual wellness visit   Preventive Care 40-64 Years, Female Preventive care refers to lifestyle choices and visits with your health care provider that can promote health and wellness. What does preventive care include?  A yearly physical exam. This is also called an annual well check.  Dental exams once or twice a year.  Routine eye exams. Ask your health care provider how often you should have your eyes checked.  Personal lifestyle choices, including:  Daily care of your teeth and gums.  Regular physical activity.  Eating a healthy diet.  Avoiding tobacco and drug use.  Limiting alcohol use.  Practicing safe sex.  Taking low-dose aspirin daily starting at age 38.  Taking vitamin and mineral supplements as recommended by your health care provider. What happens during an annual well check? The services and screenings done by your health care provider during your annual well check will depend on your  age, overall health, lifestyle risk factors, and family history of disease. Counseling  Your health care provider may ask you questions about your:  Alcohol use.  Tobacco use.  Drug use.  Emotional well-being.  Home and relationship well-being.  Sexual activity.  Eating habits.  Work and work Statistician.  Method of birth control.  Menstrual cycle.  Pregnancy history. Screening  You may have the following tests or measurements:  Height, weight, and BMI.  Blood pressure.  Lipid and cholesterol levels. These may be checked every 5 years, or more frequently if you are over 57 years old.  Skin check.  Lung cancer screening. You may have this screening every year starting at age 72 if you have a 30-pack-year history of smoking and currently smoke or have quit within the past 15 years.  Fecal occult blood test (FOBT) of the stool. You may have this test every year starting at age 107.  Flexible sigmoidoscopy or colonoscopy. You may have a sigmoidoscopy every 5 years or a colonoscopy every 10 years starting at age 70.  Hepatitis C blood test.  Hepatitis B blood test.  Sexually transmitted disease (STD) testing.  Diabetes screening. This is done by checking your blood sugar (glucose) after you have not eaten for a while (fasting). You may have this done every 1-3 years.  Mammogram. This may be done every 1-2 years. Talk to your health care provider about when you should start having regular mammograms. This may depend on whether you have a family history of breast cancer.  BRCA-related cancer screening. This may be done if you have a family history of breast, ovarian, tubal,  or peritoneal cancers.  Pelvic exam and Pap test. This may be done every 3 years starting at age 64. Starting at age 54, this may be done every 5 years if you have a Pap test in combination with an HPV test.  Bone density scan. This is done to screen for osteoporosis. You may have this scan if you  are at high risk for osteoporosis. Discuss your test results, treatment options, and if necessary, the need for more tests with your health care provider. Vaccines  Your health care provider may recommend certain vaccines, such as:  Influenza vaccine. This is recommended every year.  Tetanus, diphtheria, and acellular pertussis (Tdap, Td) vaccine. You may need a Td booster every 10 years.  Zoster vaccine. You may need this after age 39.  Pneumococcal 13-valent conjugate (PCV13) vaccine. You may need this if you have certain conditions and were not previously vaccinated.  Pneumococcal polysaccharide (PPSV23) vaccine. You may need one or two doses if you smoke cigarettes or if you have certain conditions. Talk to your health care provider about which screenings and vaccines you need and how often you need them. This information is not intended to replace advice given to you by your health care provider. Make sure you discuss any questions you have with your health care provider. Document Released: 04/17/2015 Document Revised: 12/09/2015 Document Reviewed: 01/20/2015 Elsevier Interactive Patient Education  2017 [employer] Prevention in the Home Falls can cause injuries. They can happen to people of all ages. There are many things you can do to make your home safe and to help prevent falls. What can I do on the outside of my home?  Regularly fix the edges of walkways and driveways and fix any cracks.  Remove anything that might make you trip as you walk through a door, such as a raised step or threshold.  Trim any bushes or trees on the path to your home.  Use bright outdoor lighting.  Clear any walking paths of anything that might make someone trip, such as rocks or tools.  Regularly check to see if handrails are loose or broken. Make sure that both sides of any steps have handrails.  Any raised decks and porches should have guardrails on the edges.  Have any leaves,  snow, or ice cleared regularly.  Use sand or salt on walking paths during winter.  Clean up any spills in your garage right away. This includes oil or grease spills. What can I do in the bathroom?  Use night lights.  Install grab bars by the toilet and in the tub and shower. Do not use towel bars as grab bars.  Use non-skid mats or decals in the tub or shower.  If you need to sit down in the shower, use a plastic, non-slip stool.  Keep the floor dry. Clean up any water that spills on the floor as soon as it happens.  Remove soap buildup in the tub or shower regularly.  Attach bath mats securely with double-sided non-slip rug tape.  Do not have throw rugs and other things on the floor that can make you trip. What can I do in the bedroom?  Use night lights.  Make sure that you have a light by your bed that is easy to reach.  Do not use any sheets or blankets that are too big for your bed. They should not hang down onto the floor.  Have a firm chair that has side arms. You  can use this for support while you get dressed.  Do not have throw rugs and other things on the floor that can make you trip. What can I do in the kitchen?  Clean up any spills right away.  Avoid walking on wet floors.  Keep items that you use a lot in easy-to-reach places.  If you need to reach something above you, use a strong step stool that has a grab bar.  Keep electrical cords out of the way.  Do not use floor polish or wax that makes floors slippery. If you must use wax, use non-skid floor wax.  Do not have throw rugs and other things on the floor that can make you trip. What can I do with my stairs?  Do not leave any items on the stairs.  Make sure that there are handrails on both sides of the stairs and use them. Fix handrails that are broken or loose. Make sure that handrails are as long as the stairways.  Check any carpeting to make sure that it is firmly attached to the stairs. Fix any  carpet that is loose or worn.  Avoid having throw rugs at the top or bottom of the stairs. If you do have throw rugs, attach them to the floor with carpet tape.  Make sure that you have a light switch at the top of the stairs and the bottom of the stairs. If you do not have them, ask someone to add them for you. What else can I do to help prevent falls?  Wear shoes that:  Do not have high heels.  Have rubber bottoms.  Are comfortable and fit you well.  Are closed at the toe. Do not wear sandals.  If you use a stepladder:  Make sure that it is fully opened. Do not climb a closed stepladder.  Make sure that both sides of the stepladder are locked into place.  Ask someone to hold it for you, if possible.  Clearly mark and make sure that you can see:  Any grab bars or handrails.  First and last steps.  Where the edge of each step is.  Use tools that help you move around (mobility aids) if they are needed. These include:  Canes.  Walkers.  Scooters.  Crutches.  Turn on the lights when you go into a dark area. Replace any light bulbs as soon as they burn out.  Set up your furniture so you have a clear path. Avoid moving your furniture around.  If any of your floors are uneven, fix them.  If there are any pets around you, be aware of where they are.  Review your medicines with your doctor. Some medicines can make you feel dizzy. This can increase your chance of falling. Ask your doctor what other things that you can do to help prevent falls. This information is not intended to replace advice given to you by your health care provider. Make sure you discuss any questions you have with your health care provider. Document Released: 01/15/2009 Document Revised: 08/27/2015 Document Reviewed: 04/25/2014 Elsevier Interactive Patient Education  2017 [employer].

## 2019-05-15 NOTE — Progress Notes (Signed)
 Subjective:   Stacey Gross is a 59 y.o. female who presents for Medicare Annual (Subsequent) preventive examination.  This visit is being conducted via phone call  - after an attmept to do on video chat - due to the COVID-19 pandemic. This patient has given me verbal consent via phone to conduct this visit, patient states they are participating from their home address. Some vital signs may be absent or patient reported.   Patient identification: identified by name, DOB, and current address.    Review of Systems:   Cardiac Risk Factors include: advanced age (>55men, >65 women);dyslipidemia;hypertension;diabetes mellitus     Objective:     Vitals: There were no vitals taken for this visit.  There is no height or weight on file to calculate BMI.  Advanced Directives 05/15/2019 02/01/2019 10/16/2018 04/24/2018 02/01/2018 09/20/2017 06/07/2017  Does Patient Have a Medical Advance Directive? Yes No Yes Yes No Yes Yes  Type of Advance Directive Living will;Healthcare Power of Attorney - Living will - - Living will Living will  Copy of Healthcare Power of Attorney in Chart? No - copy requested - - - - - -  Would patient like information on creating a medical advance directive? - No - Patient declined - - Yes (MAU/Ambulatory/Procedural Areas - Information given) - -    Tobacco Social History   Tobacco Use  Smoking Status Current Every Day Smoker  . Packs/day: 1.00  . Years: 48.00  . Pack years: 48.00  . Types: Cigarettes  Smokeless Tobacco Never Used     Ready to quit: Not Answered Counseling given: Not Answered   Clinical Intake:  Pre-visit preparation completed: Yes  Pain : 0-10 Pain Score: 5  Pain Type: Chronic pain Pain Location: Back Pain Onset: More than a month ago Pain Frequency: Constant     Nutritional Status: BMI > 30  Obese Nutritional Risks: None Diabetes: Yes CBG done?: No Did pt. bring in CBG monitor from home?: No  How often do you need to have  someone help you when you read instructions, pamphlets, or other written materials from your doctor or pharmacy?: 1 - Never  Interpreter Needed?: No  Information entered by :: Tiffany Hill,LPN  Past Medical History:  Diagnosis Date  . Asthma   . Cancer (HCC)    stomach  . Chronic back pain   . COPD (chronic obstructive pulmonary disease) (HCC)   . Degenerative joint disease (DJD) of hip   . Diabetes mellitus without complication (HCC)   . Dyspnea   . GERD (gastroesophageal reflux disease)    takes Omeprazole daily  . History of bronchitis yrs ago  . History of colon polyps    benign  . History of staph infection 10 yrs ago  . Hyperlipidemia    takes Atorvastatin daily  . Hypertension   . Peripheral vascular disease (HCC)   . Tobacco use    8 cigarettes daily   . Weakness    numbness and tingling in both legs    Past Surgical History:  Procedure Laterality Date  . ABDOMINAL HYSTERECTOMY     total  . APPENDECTOMY    . BACK SURGERY  9-10 yrs ago  . BREAST BIOPSY Right    Core- neg  . COLONOSCOPY    . COLONOSCOPY WITH PROPOFOL N/A 09/20/2017   Procedure: COLONOSCOPY WITH PROPOFOL;  Surgeon: Tahiliani, Varnita B, MD;  Location: [facility] ENDOSCOPY;  Service: Endoscopy;  Laterality: N/A;  . COLONOSCOPY WITH PROPOFOL N/A 04/24/2018   Procedure:   COLONOSCOPY WITH PROPOFOL;  Surgeon: Wohl, Darren, MD;  Location: [facility] ENDOSCOPY;  Service: Endoscopy;  Laterality: N/A;  . ESOPHAGOGASTRODUODENOSCOPY (EGD) WITH PROPOFOL N/A 09/20/2017   Procedure: ESOPHAGOGASTRODUODENOSCOPY (EGD) WITH PROPOFOL;  Surgeon: Tahiliani, Varnita B, MD;  Location: [facility] ENDOSCOPY;  Service: Endoscopy;  Laterality: N/A;  . ESOPHAGOGASTRODUODENOSCOPY (EGD) WITH PROPOFOL N/A 11/20/2018   Procedure: ESOPHAGOGASTRODUODENOSCOPY (EGD) WITH PROPOFOL;  Surgeon: Wohl, Darren, MD;  Location: [facility] ENDOSCOPY;  Service: Endoscopy;  Laterality: N/A;  . PERIPHERAL VASCULAR CATHETERIZATION Left 03/17/2016   Procedure: Lower  Extremity Angiography;  Surgeon: Jason S Dew, MD;  Location: [facility]IVE [facility];  Service: Cardiovascular;  Laterality: Left;   Family History  Problem Relation Age of Onset  . Hyperlipidemia Mother   . Hyperlipidemia Father   . Melanoma Sister   . Breast cancer Neg Hx    Social History   Socioeconomic History  . Marital status: Widowed    Spouse name: Not on file  . Number of children: 0  . Years of education: 8  . Highest education level: Not on file  Occupational History  . Occupation: disabled  Tobacco Use  . Smoking status: Current Every Day Smoker    Packs/day: 1.00    Years: 48.00    Pack years: 48.00    Types: Cigarettes  . Smokeless tobacco: Never Used  Substance and Sexual Activity  . Alcohol use: No  . Drug use: No  . Sexual activity: Not on file  Other Topics Concern  . Not on file  Social History Narrative  . Not on file   Social Determinants of Health   Financial Resource Strain:   . Difficulty of Paying Living Expenses: Not on file  Food Insecurity:   . Worried About Running Out of Food in the Last Year: Not on file  . Ran Out of Food in the Last Year: Not on file  Transportation Needs:   . Lack of Transportation (Medical): Not on file  . Lack of Transportation (Non-Medical): Not on file  Physical Activity:   . Days of Exercise per Week: Not on file  . Minutes of Exercise per Session: Not on file  Stress:   . Feeling of Stress : Not on file  Social Connections:   . Frequency of Communication with Friends and Family: Not on file  . Frequency of Social Gatherings with Friends and Family: Not on file  . Attends Religious Services: Not on file  . Active Member of Clubs or Organizations: Not on file  . Attends Club or Organization Meetings: Not on file  . Marital Status: Not on file    Outpatient Encounter Medications as of 05/15/2019  Medication Sig  . albuterol (PROVENTIL) (2.5 MG/3ML) 0.083% nebulizer solution Take 2.5 mg by nebulization  every 6 (six) hours as needed for wheezing or shortness of breath.   . albuterol (VENTOLIN HFA) 108 (90 Base) MCG/ACT inhaler INHALE 2 PUFFS BY MOUTH INTO THE LUNGS EVERY 6 HOURS AS NEEDED FOR WHEEZING OR SHORTNESS OF BREATH  . atorvastatin (LIPITOR) 40 MG tablet Take 1 tablet (40 mg total) by mouth daily.  . Blood Glucose Monitoring Suppl (ONE TOUCH ULTRA SYSTEM KIT) W/DEVICE KIT 1 kit by Does not apply route once. (Patient taking differently: 1 kit by Does not apply route daily. )  . buPROPion (WELLBUTRIN SR) 150 MG 12 hr tablet Take 150 mg by mouth 2 (two) times daily.  . camphor-menthol (SARNA) lotion Apply 1 application topically as needed for itching.  . cilostazol (PLETAL)   100 MG tablet Take 1 tablet (100 mg total) by mouth 2 (two) times daily before a meal.  . clopidogrel (PLAVIX) 75 MG tablet Take 1 tablet (75 mg total) by mouth daily.  . cyclobenzaprine (FLEXERIL) 10 MG tablet TAKE ONE TABLET BY MOUTH AT BEDTIME (Patient taking differently: Take 10 mg by mouth at bedtime as needed for muscle spasms. )  . docusate sodium (COLACE) 100 MG capsule Take 100 mg by mouth daily as needed for mild constipation.  . Fluticasone-Umeclidin-Vilant (TRELEGY ELLIPTA) 100-62.5-25 MCG/INH AEPB Inhale 1 puff into the lungs daily.  . glucose blood test strip Use to check blood sugar three times a day.  . losartan (COZAAR) 25 MG tablet Take 1 tablet (25 mg total) by mouth daily.  . metFORMIN (GLUCOPHAGE) 500 MG tablet Take 1 tablet (500 mg total) by mouth 2 (two) times daily with a meal.  . pantoprazole (PROTONIX) 40 MG tablet Take 1 tablet (40 mg total) by mouth daily.  . Semaglutide,0.25 or 0.5MG/DOS, (OZEMPIC, 0.25 OR 0.5 MG/DOSE,) 2 MG/1.5ML SOPN Inject 0.5 mg into the skin once a week.  . traZODone (DESYREL) 100 MG tablet Take 1 tablet (100 mg total) by mouth at bedtime as needed for sleep.  . meloxicam (MOBIC) 15 MG tablet Take 1 tablet (15 mg total) by mouth daily. (Patient not taking: Reported on  04/02/2019)  . [DISCONTINUED] budesonide-formoterol (SYMBICORT) 160-4.5 MCG/ACT inhaler Inhale 2 puffs into the lungs 2 (two) times daily. (Patient not taking: Reported on 05/15/2019)  . [DISCONTINUED] Tiotropium Bromide Monohydrate (SPIRIVA RESPIMAT) 2.5 MCG/ACT AERS Inhale 2 puffs into the lungs daily. (Patient not taking: Reported on 04/02/2019)   No facility-administered encounter medications on file as of 05/15/2019.    Activities of Daily Living In your present state of health, do you have any difficulty performing the following activities: 05/15/2019 05/14/2019  Hearing? N N  Comment no hearing aids -  Vision? N N  Comment eyeglasses, [facility] -  Difficulty concentrating or making decisions? N N  Walking or climbing stairs? N N  Dressing or bathing? N N  Doing errands, shopping? N N  Preparing Food and eating ? N -  Using the Toilet? N -  In the past six months, have you accidently leaked urine? N -  Do you have problems with loss of bowel control? N -  Managing your Medications? N -  Managing your Finances? N -  Housekeeping or managing your Housekeeping? N -  Some recent data might be hidden    Patient Care Team: Cannady, Jolene T, NP as PCP - General (Nurse Practitioner) Crissman, Mark A, MD (Family Medicine) Travis, Catherine E, RPH (Pharmacist)    Assessment:   This is a routine wellness examination for Stacey Gross.  Exercise Activities and Dietary recommendations Current Exercise Habits: Home exercise routine, Type of exercise: stretching, Time (Minutes): 10, Frequency (Times/Week): 2, Weekly Exercise (Minutes/Week): 20, Intensity: Mild, Exercise limited by: None identified  Goals    . PharmD "I want to get my A1c down" (pt-stated)     Current Barriers:  . Diabetes: uncontrolled but improved, most recent A1c 7.0% . Current antihyperglycemic regimen: metformin 500 mg BID, Ozempic 0.5 mg once weekly . Current blood glucose readings: Fasting <130, post prandial  <180 generally . Current meal patterns: o Has cut back on sodas; previously was drinking ~3 per day, now at 1/2 per day  . Current exercise:  o Walking ~1 mile daily; previously was waking 2 miles a day  o   [place] and was able to walk much further than she previously could  . Cardiovascular risk reduction: o Current hypertensive regimen: lisinopril 10 mg daily, BP generally well controlled <130/80 at recent appointments o Current hyperlipidemia regimen: atorvastatin 20 mg; LDL at goal <100  Pharmacist Clinical Goal(s):  . Over the next 90 days, patient with work with PharmD and primary care provider to address optimized medication management  Interventions: . Congratulated patient on maintenance of goal BG.  . Reviewed goal A1c, fasting glucose, and 2 hour post prandial glucose. Provided this in writing  Patient Self Care Activities:  . Patient will check blood glucose regularly , document, and provide at future appointments . Patient will take medications as prescribed . Patient will report any questions or concerns to provider   Please see past updates related to this goal by clicking on the "Past Updates" button in the selected goal      . PharmD "My breathing is bad right now" (pt-stated)     Current Barriers:  . Tobacco abuse since age 10; currently smoking 1/2 ppd . Previous quit attempt for ~2 months with husband, but quickly relapsed. Has been on Chantix therapy, which helped her reduce how much she is smoking. However, increased irritability led to Chantix + fluoxetine being discontinued, and bupropion being started a few weeks ago. Reports lessened irritability . Is interested in using nicotine replacement therapy   Pharmacist Clinical Goal(s):  . Over the next 60 days, patient will work with PharmD and provider towards tobacco cessation  Interventions: . Comprehensive medication review performed, medication list in electronic medical record  updated . Recommend NRT patches + gum/lozenges in combination with bupropion. Patient notes that she cannot afford them if not covered by insurance. Recommended that she contact the [place] Quit Line (1-800-QUIT-NOW). Patient will outreach this group for support.  Patient Self Care Activities:  . Patient will commit to reducing tobacco consumption  Initial goal documentation      . Quit smoking / using tobacco     Smoking cessation discussed        Fall Risk: Fall Risk  05/15/2019 07/13/2018 05/09/2018 02/01/2018 08/08/2017  Falls in the past year? 0 0 0 No No  Number falls in past yr: 0 - 0 - -  Injury with Fall? 0 - 0 - -  Risk for fall due to : - - - - -  Risk for fall due to: Comment - - - - -  Follow up - Falls evaluation completed - - -    FALL RISK PREVENTION PERTAINING TO THE HOME:  Any stairs in or around the home? No  If so, are there any without handrails? No   Home free of loose throw rugs in walkways, pet beds, electrical cords, etc? Yes  Adequate lighting in your home to reduce risk of falls? Yes   ASSISTIVE DEVICES UTILIZED TO PREVENT FALLS:  Life alert? No  Use of a cane, walker or w/c? No  Grab bars in the bathroom? No  Shower chair or bench in shower? No  Elevated toilet seat or a handicapped toilet? No   DME ORDERS:  DME order needed?  No   TIMED UP AND GO:  Unable to perform    Depression Screen PHQ 2/9 Scores 05/15/2019 05/14/2019 12/17/2018 05/09/2018  PHQ - 2 Score 0 0 0 0  PHQ- 9 Score - 3 4 9  Exception Documentation - - - -     Cognitive   Function     6CIT Screen 02/01/2018 01/25/2017  What Year? 0 points 0 points  What month? 0 points 0 points  What time? 0 points 0 points  Count back from 20 0 points 0 points  Months in reverse 0 points 0 points  Repeat phrase 0 points 0 points  Total Score 0 0    Immunization History  Administered Date(s) Administered  . Influenza,inj,Quad PF,6+ Mos 02/09/2016, 02/22/2017, 05/09/2018, 12/17/2018  .  Pneumococcal Polysaccharide-23 02/09/2016  . Td 04/04/2004, 05/13/2014  . Zoster Recombinat (Shingrix) 01/07/2019, 04/22/2019    Qualifies for Shingles Vaccine? Yes, shingrix completed   Tdap: up to date   Flu Vaccine: up to date   Pneumococcal Vaccine: up to date    Screening Tests Health Maintenance  Topic Date Due  . OPHTHALMOLOGY EXAM  03/15/2015  . HEMOGLOBIN A1C  11/11/2019  . MAMMOGRAM  03/26/2020  . FOOT EXAM  05/13/2020  . COLONOSCOPY  04/24/2021  . TETANUS/TDAP  05/13/2024  . INFLUENZA VACCINE  Completed  . PNEUMOCOCCAL POLYSACCHARIDE VACCINE AGE 2-64 HIGH RISK  Completed  . Hepatitis C Screening  Completed  . HIV Screening  Completed    Cancer Screenings:  Colorectal Screening: Completed 04/24/2018. Repeat every 3 years  Mammogram:    Bone Density: not indicated   Lung Cancer Screening: (Low Dose CT Chest recommended if Age 55-80 years, 30 pack-year currently smoking OR have quit w/in 15years.) does qualify.   Completed 05/30/2018  Additional Screening:  Hepatitis C Screening: does qualify; Completed 02/11/2019  Vision Screening: Recommended annual ophthalmology exams for early detection of glaucoma and other disorders of the eye. Is the patient up to date with their annual eye exam?  no Who is the provider or what is the name of the office in which the pt attends annual eye exams? [facility]   Dental Screening: Recommended annual dental exams for proper oral hygiene  Community Resource Referral:  CRR required this visit?  No       Plan:  I have personally reviewed and addressed the Medicare Annual Wellness questionnaire and have noted the following in the patient's chart:  A. Medical and social history B. Use of alcohol, tobacco or illicit drugs  C. Current medications and supplements D. Functional ability and status E.  Nutritional status F.  Physical activity G. Advance directives H. List of other physicians I.  Hospitalizations,  surgeries, and ER visits in previous 12 months J.  Vitals K. Screenings such as hearing and vision if needed, cognitive and depression L. Referrals and appointments   In addition, I have reviewed and discussed with patient certain preventive protocols, quality metrics, and best practice recommendations. A written personalized care plan for preventive services as well as general preventive health recommendations were provided to patient.  Signed,    Hill, Tiffany A, LPN  05/15/2019 Nurse Health Advisor   Nurse Notes: none   

## 2019-05-16 ENCOUNTER — Telehealth: Payer: Self-pay | Admitting: *Deleted

## 2019-05-16 ENCOUNTER — Other Ambulatory Visit: Payer: Self-pay | Admitting: Nurse Practitioner

## 2019-05-16 DIAGNOSIS — Z87891 Personal history of nicotine dependence: Secondary | ICD-10-CM

## 2019-05-16 LAB — COMPREHENSIVE METABOLIC PANEL
ALT: 20 IU/L (ref 0–32)
AST: 19 IU/L (ref 0–40)
Albumin/Globulin Ratio: 2 (ref 1.2–2.2)
Albumin: 4.5 g/dL (ref 3.8–4.8)
Alkaline Phosphatase: 101 IU/L (ref 39–117)
BUN/Creatinine Ratio: 10 — ABNORMAL LOW (ref 12–28)
BUN: 9 mg/dL (ref 8–27)
Bilirubin Total: 0.4 mg/dL (ref 0.0–1.2)
CO2: 20 mmol/L (ref 20–29)
Calcium: 10 mg/dL (ref 8.7–10.3)
Chloride: 100 mmol/L (ref 96–106)
Creatinine, Ser: 0.91 mg/dL (ref 0.57–1.00)
GFR calc Af Amer: 79 mL/min/{1.73_m2} (ref >59)
GFR calc non Af Amer: 68 mL/min/{1.73_m2} (ref >59)
Globulin, Total: 2.3 g/dL (ref 1.5–4.5)
Glucose: 107 mg/dL — ABNORMAL HIGH (ref 65–99)
Potassium: 4.5 mmol/L (ref 3.5–5.2)
Sodium: 141 mmol/L (ref 134–144)
Total Protein: 6.8 g/dL (ref 6.0–8.5)

## 2019-05-16 LAB — LIPID PANEL W/O CHOL/HDL RATIO
Cholesterol, Total: 173 mg/dL (ref 100–199)
HDL: 37 mg/dL — ABNORMAL LOW (ref >39)
LDL Chol Calc (NIH): 103 mg/dL — ABNORMAL HIGH (ref 0–99)
Triglycerides: 190 mg/dL — ABNORMAL HIGH (ref 0–149)
VLDL Cholesterol Cal: 33 mg/dL (ref 5–40)

## 2019-05-16 LAB — VITAMIN B12: Vitamin B-12: 303 pg/mL (ref 232–1245)

## 2019-05-16 MED ORDER — VITAMIN B-12 1000 MCG PO TABS: 1000.0000 ug | ORAL_TABLET | Freq: Every day | ORAL | 2 refills | Status: DC

## 2019-05-16 MED ORDER — ATORVASTATIN CALCIUM 80 MG PO TABS: 80.0000 mg | ORAL_TABLET | Freq: Every day | ORAL | 3 refills | Status: DC

## 2019-05-16 NOTE — Telephone Encounter (Signed)
(  05/16/19) Patient has been notified that lung cancer screening CT scan is due currently or will be in near future. Confirmed that patient is within the appropriate age range, and asymptomatic. Patient denies illness that would prevent curative treatment for lung cancer if found. Verified smoking history (Current Smoker,1 ppd ). Patient is agreeable for CT scan being scheduled, and has no time or day preference  SRW

## 2019-05-16 NOTE — Progress Notes (Signed)
Spoke to patient on phone.  Cholesterol levels not at goal.  Increase Atorvastatin to 80 MG daily.  B12 level on low side, start daily B12.  Scripts sent in and all questions answered.

## 2019-05-16 NOTE — Progress Notes (Signed)
Spoke to patient on phone.  Cholesterol levels not at goal.  Increase Atorvastatin to 80 MG daily.  B12 level on low side, start daily B12.

## 2019-05-20 NOTE — Telephone Encounter (Signed)
Smoking history: current, 97 pack year.

## 2019-05-20 NOTE — Addendum Note (Signed)
Addended by: Lieutenant Diego on: 05/20/2019 11:59 AM   Modules accepted: Orders

## 2019-05-24 ENCOUNTER — Telehealth: Payer: Self-pay | Admitting: Nurse Practitioner

## 2019-05-24 NOTE — Telephone Encounter (Signed)
Spoke to patient on phone, she reports the rash on back has changed and gotten worse.  Round spots that are itchy.  Advised her to head into urgent care this evening or weekend for face to face since area is worsening.  She will notify provider of visit plan.

## 2019-05-24 NOTE — Telephone Encounter (Signed)
Called pt she states that the medication is sarna advised her to hold the medication

## 2019-05-24 NOTE — Telephone Encounter (Signed)
Ask her which medication and to hold medication and I will call this afternoon

## 2019-05-24 NOTE — Telephone Encounter (Signed)
Copied from [place] [phone]. Topic: General - Other >> May 24, 2019  8:17 AM Keene Breath wrote: Reason for CRM: Patient called to inform the doctor that the medication she was given is making her break out on her back. It is very itchy.  Please advise and call patient asap at [phone]

## 2019-05-27 ENCOUNTER — Telehealth: Payer: Self-pay | Admitting: Family Medicine

## 2019-05-27 NOTE — Telephone Encounter (Signed)
Your pt  Copied from [place] [phone]. Topic: General - Other >> May 24, 2019  8:17 AM Keene Breath wrote: Reason for CRM: Patient called to inform the doctor that the medication she was given is making her break out on her back. It is very itchy.  Please advise and call patient asap at [phone] >> May 27, 2019  4:30 PM Keene Breath wrote: Patient called again and said she did not want to go to a walk in clinic.  She would like the nurse or doctor to call her to see if she can get some medication.  CB# [phone]

## 2019-05-27 NOTE — Telephone Encounter (Signed)
Needs office visit this week for follow-up please.

## 2019-05-28 ENCOUNTER — Ambulatory Visit (INDEPENDENT_AMBULATORY_CARE_PROVIDER_SITE_OTHER): Payer: Medicare Other | Admitting: Nurse Practitioner

## 2019-05-28 ENCOUNTER — Encounter: Payer: Self-pay | Admitting: Nurse Practitioner

## 2019-05-28 ENCOUNTER — Other Ambulatory Visit: Payer: Self-pay

## 2019-05-28 VITALS — BP 123/82 | HR 86 | Temp 98.1°F

## 2019-05-28 DIAGNOSIS — L300 Nummular dermatitis: Secondary | ICD-10-CM | POA: Insufficient documentation

## 2019-05-28 DIAGNOSIS — R21 Rash and other nonspecific skin eruption: Secondary | ICD-10-CM | POA: Diagnosis not present

## 2019-05-28 DIAGNOSIS — E1151 Type 2 diabetes mellitus with diabetic peripheral angiopathy without gangrene: Secondary | ICD-10-CM

## 2019-05-28 MED ORDER — NYSTATIN 100000 UNIT/GM EX CREA: 1.0000 "application " | TOPICAL_CREAM | Freq: Two times a day (BID) | CUTANEOUS | 0 refills | Status: DC

## 2019-05-28 MED ORDER — CLOBETASOL PROPIONATE 0.05 % EX CREA: 1.0000 "application " | TOPICAL_CREAM | Freq: Two times a day (BID) | CUTANEOUS | 0 refills | Status: DC

## 2019-05-28 MED ORDER — HYDROXYZINE PAMOATE 25 MG PO CAPS: 25.0000 mg | ORAL_CAPSULE | Freq: Three times a day (TID) | ORAL | 0 refills | Status: DC | PRN

## 2019-05-28 NOTE — Telephone Encounter (Signed)
Appt scheduled

## 2019-05-28 NOTE — Assessment & Plan Note (Signed)
Acute x one scaly patch to mid-back.  Pruritic in nature.  Mild erythema under bilateral breast. Continue Sarna lotion to arms as needed.  Scripts sent for Clobetasol to area on back and Nystatin under breasts as needed.  Hydroxyzine for pruritus as needed, script sent.  Recommend transition to less hot showers and baths.  Continue oatmeal baths at home.  Return for worsening or continues issues.

## 2019-05-28 NOTE — Addendum Note (Signed)
Addended by: Marnee Guarneri T on: 05/28/2019 07:25 PM   Modules accepted: Orders

## 2019-05-28 NOTE — Patient Instructions (Signed)
Rash, Adult  A rash is a change in the color of your skin. A rash can also change the way your skin feels. There are many different conditions and factors that can cause a rash. Follow these instructions at home: The goal of treatment is to stop the itching and keep the rash from spreading. Watch for any changes in your symptoms. Let your doctor know about them. Follow these instructions to help with your condition: Medicine Take or apply over-the-counter and prescription medicines only as told by your doctor. These may include medicines:  To treat red or swollen skin (corticosteroid creams).  To treat itching.  To treat an allergy (oral antihistamines).  To treat very bad symptoms (oral corticosteroids).  Skin care  Put cool cloths (compresses) on the affected areas.  Do not scratch or rub your skin.  Avoid covering the rash. Make sure that the rash is exposed to air as much as possible. Managing itching and discomfort  Avoid hot showers or baths. These can make itching worse. A cold shower may help.  Try taking a bath with: ? Epsom salts. You can get these at your local pharmacy or grocery store. Follow the instructions on the package. ? Baking soda. Pour a small amount into the bath as told by your doctor. ? Colloidal oatmeal. You can get this at your local pharmacy or grocery store. Follow the instructions on the package.  Try putting baking soda paste onto your skin. Stir water into baking soda until it gets like a paste.  Try putting on a lotion that relieves itchiness (calamine lotion).  Keep cool and out of the sun. Sweating and being hot can make itching worse. General instructions   Rest as needed.  Drink enough fluid to keep your pee (urine) pale yellow.  Wear loose-fitting clothing.  Avoid scented soaps, detergents, and perfumes. Use gentle soaps, detergents, perfumes, and other cosmetic products.  Avoid anything that causes your rash. Keep a journal to  help track what causes your rash. Write down: ? What you eat. ? What cosmetic products you use. ? What you drink. ? What you wear. This includes jewelry.  Keep all follow-up visits as told by your doctor. This is important. Contact a doctor if:  You sweat at night.  You lose weight.  You pee (urinate) more than normal.  You pee less than normal, or you notice that your pee is a darker color than normal.  You feel weak.  You throw up (vomit).  Your skin or the whites of your eyes look yellow (jaundice).  Your skin: ? Tingles. ? Is numb.  Your rash: ? Does not go away after a few days. ? Gets worse.  You are: ? More thirsty than normal. ? More tired than normal.  You have: ? New symptoms. ? Pain in your belly (abdomen). ? A fever. ? Watery poop (diarrhea). Get help right away if:  You have a fever and your symptoms suddenly get worse.  You start to feel mixed up (confused).  You have a very bad headache or a stiff neck.  You have very bad joint pains or stiffness.  You have jerky movements that you cannot control (seizure).  Your rash covers all or most of your body. The rash may or may not be painful.  You have blisters that: ? Are on top of the rash. ? Grow larger. ? Grow together. ? Are painful. ? Are inside your nose or mouth.  You have a rash   that: ? Looks like purple pinprick-sized spots all over your body. ? Has a "bull's eye" or looks like a target. ? Is red and painful, causes your skin to peel, and is not from being in the sun too long. Summary  A rash is a change in the color of your skin. A rash can also change the way your skin feels.  The goal of treatment is to stop the itching and keep the rash from spreading.  Take or apply over-the-counter and prescription medicines only as told by your doctor.  Contact a doctor if you have new symptoms or symptoms that get worse.  Keep all follow-up visits as told by your doctor. This is  important. This information is not intended to replace advice given to you by your health care provider. Make sure you discuss any questions you have with your health care provider. Document Revised: 07/13/2018 Document Reviewed: 10/23/2017 Elsevier Patient Education  2020 [employer]  

## 2019-05-28 NOTE — Progress Notes (Signed)
BP 123/82 (BP Location: Left Arm, Patient Position: Sitting, Cuff Size: Normal)   Pulse 86   Temp 98.1 F (36.7 C) (Oral)   SpO2 94%    Subjective:    Patient ID: Stacey Gross, female    DOB: 1961/02/23, 59 y.o.   MRN: [id number]  HPI: Stacey Gross is a 59 y.o. female  Chief Complaint  Patient presents with  . Rash   RASH Started 1 1/2 weeks ago, tried [place] with no relief.  Tried oatmeal bath, did not work. No recent medication changes, other than Losartan, which was started after rash appeared.   Duration:  weeks  Location: back  Itching: yes Burning: yes Redness: yes Oozing: no Scaling: yes Blisters: no Painful: no Fevers: no Change in detergents/soaps/personal care products: no Recent illness: no Recent travel:no History of same: no Context: worse Alleviating factors: benadryl Treatments attempted:hydrocortisone cream, benadryl and lotion/moisturizer Shortness of breath: no  Throat/tongue swelling: no Myalgias/arthralgias: no  Relevant past medical, surgical, family and social history reviewed and updated as indicated. Interim medical history since our last visit reviewed. Allergies and medications reviewed and updated.  Review of Systems  Constitutional: Negative for activity change, appetite change, diaphoresis, fatigue and fever.  Respiratory: Negative for cough, chest tightness and shortness of breath.   Cardiovascular: Negative for chest pain, palpitations and leg swelling.  Skin: Positive for rash.  Psychiatric/Behavioral: Negative.     Per HPI unless specifically indicated above     Objective:    BP 123/82 (BP Location: Left Arm, Patient Position: Sitting, Cuff Size: Normal)   Pulse 86   Temp 98.1 F (36.7 C) (Oral)   SpO2 94%   Wt Readings from Last 3 Encounters:  05/14/19 214 lb (97.1 kg)  02/11/19 224 lb (101.6 kg)  02/06/19 225 lb (102.1 kg)    Physical Exam Vitals and nursing note reviewed.  Constitutional:      General: She  is awake. She is not in acute distress.    Appearance: She is well-developed. She is obese. She is not ill-appearing.  HENT:     Head: Normocephalic.     Right Ear: Hearing normal.     Left Ear: Hearing normal.  Eyes:     General: Lids are normal.        Right eye: No discharge.        Left eye: No discharge.     Conjunctiva/sclera: Conjunctivae normal.     Pupils: Pupils are equal, round, and reactive to light.  Cardiovascular:     Rate and Rhythm: Normal rate and regular rhythm.     Heart sounds: Normal heart sounds. No murmur. No gallop.   Pulmonary:     Effort: Pulmonary effort is normal. No accessory muscle usage or respiratory distress.     Breath sounds: Normal breath sounds.  Abdominal:     General: Bowel sounds are normal.     Palpations: Abdomen is soft. There is no hepatomegaly or splenomegaly.  Musculoskeletal:     Cervical back: Normal range of motion and neck supple.     Right lower leg: No edema.     Left lower leg: No edema.  Skin:    General: Skin is warm and dry.          Comments: Dry skin on arms and legs.  No erythema.  Mild erythema under bilateral breast.  Neurological:     Mental Status: She is alert and oriented to person, place, and time.  Psychiatric:  Attention and Perception: Attention normal.        Mood and Affect: Mood normal.        Speech: Speech normal.        Behavior: Behavior normal. Behavior is cooperative.    Results for orders placed or performed in visit on 05/14/19  Bayer DCA Hb A1c Waived  Result Value Ref Range   HB A1C (BAYER DCA - WAIVED) 6.3 <7.0 %  Microalbumin, Urine Waived  Result Value Ref Range   Microalb, Ur Waived 150 (H) 0 - 19 mg/L   Creatinine, Urine Waived 300 10 - 300 mg/dL   Microalb/Creat Ratio 30-300 (H) <30 mg/g  Comprehensive metabolic panel  Result Value Ref Range   Glucose 107 (H) 65 - 99 mg/dL   BUN 9 8 - 27 mg/dL   Creatinine, Ser 0.91 0.57 - 1.00 mg/dL   GFR calc non Af Amer 68 >59  mL/min/1.73   GFR calc Af Amer 79 >59 mL/min/1.73   BUN/Creatinine Ratio 10 (L) 12 - 28   Sodium 141 134 - 144 mmol/L   Potassium 4.5 3.5 - 5.2 mmol/L   Chloride 100 96 - 106 mmol/L   CO2 20 20 - 29 mmol/L   Calcium 10.0 8.7 - 10.3 mg/dL   Total Protein 6.8 6.0 - 8.5 g/dL   Albumin 4.5 3.8 - 4.8 g/dL   Globulin, Total 2.3 1.5 - 4.5 g/dL   Albumin/Globulin Ratio 2.0 1.2 - 2.2   Bilirubin Total 0.4 0.0 - 1.2 mg/dL   Alkaline Phosphatase 101 39 - 117 IU/L   AST 19 0 - 40 IU/L   ALT 20 0 - 32 IU/L  Lipid Panel w/o Chol/HDL Ratio  Result Value Ref Range   Cholesterol, Total 173 100 - 199 mg/dL   Triglycerides 190 (H) 0 - 149 mg/dL   HDL 37 (L) >39 mg/dL   VLDL Cholesterol Cal 33 5 - 40 mg/dL   LDL Chol Calc (NIH) 103 (H) 0 - 99 mg/dL  Vitamin B12  Result Value Ref Range   Vitamin B-12 303 232 - 1,245 pg/mL      Assessment & Plan:   Problem List Items Addressed This Visit      Musculoskeletal and Integument   Rash    Acute x one scaly patch to mid-back.  Pruritic in nature.  Mild erythema under bilateral breast. Continue Sarna lotion to arms as needed.  Scripts sent for Clobetasol to area on back and Nystatin under breasts as needed.  Hydroxyzine for pruritus as needed, script sent.  Recommend transition to less hot showers and baths.  Continue oatmeal baths at home.  Return for worsening or continues issues.          Follow up plan: Return if symptoms worsen or fail to improve.

## 2019-05-30 ENCOUNTER — Ambulatory Visit
Admission: RE | Admit: 2019-05-30 | Discharge: 2019-05-30 | Disposition: A | Discharge status: Home / Self Care | Payer: Medicare Other | Source: Ambulatory Visit | Attending: Oncology | Admitting: Oncology

## 2019-05-30 ENCOUNTER — Other Ambulatory Visit: Payer: Self-pay

## 2019-05-30 DIAGNOSIS — Z87891 Personal history of nicotine dependence: Secondary | ICD-10-CM

## 2019-05-30 DIAGNOSIS — F17210 Nicotine dependence, cigarettes, uncomplicated: Secondary | ICD-10-CM | POA: Diagnosis not present

## 2019-05-31 ENCOUNTER — Telehealth: Payer: Self-pay | Admitting: Family Medicine

## 2019-05-31 NOTE — Telephone Encounter (Signed)
This message was forwarded in another feed to PCP and pt was seen  Copied from [place] [phone]. Topic: General - Other >> May 24, 2019  8:17 AM Keene Breath wrote: Reason for CRM: Patient called to inform the doctor that the medication she was given is making her break out on her back. It is very itchy.  Please advise and call patient asap at [phone] >> May 27, 2019  4:30 PM Keene Breath wrote: Patient called again and said she did not want to go to a walk in clinic.  She would like the nurse or doctor to call her to see if she can get some medication.  CB# [phone]

## 2019-06-03 ENCOUNTER — Encounter: Payer: Self-pay | Admitting: Nurse Practitioner

## 2019-06-03 ENCOUNTER — Encounter: Payer: Self-pay | Admitting: *Deleted

## 2019-06-03 DIAGNOSIS — K760 Fatty (change of) liver, not elsewhere classified: Secondary | ICD-10-CM | POA: Insufficient documentation

## 2019-06-04 ENCOUNTER — Other Ambulatory Visit: Payer: Self-pay | Admitting: Orthopedic Surgery

## 2019-06-04 ENCOUNTER — Telehealth: Payer: Self-pay | Admitting: Nurse Practitioner

## 2019-06-04 ENCOUNTER — Other Ambulatory Visit: Payer: Self-pay | Admitting: Nurse Practitioner

## 2019-06-04 DIAGNOSIS — M19011 Primary osteoarthritis, right shoulder: Secondary | ICD-10-CM

## 2019-06-04 MED ORDER — ATORVASTATIN CALCIUM 80 MG PO TABS: 80.0000 mg | ORAL_TABLET | Freq: Every day | ORAL | 3 refills | Status: DC

## 2019-06-04 MED ORDER — TRELEGY ELLIPTA 100-62.5-25 MCG/INH IN AEPB: 1.0000 | INHALATION_SPRAY | Freq: Every day | RESPIRATORY_TRACT | 3 refills | Status: DC

## 2019-06-04 NOTE — Telephone Encounter (Signed)
atorvastatin (LIPITOR) 80 MG tablet    Pharmacist with [facility] calling to request this medication be updated, as patient stated that she now takes it twice a day, rather than x1 a day.       She also requests this medications be switched to 90 day supply.   Fluticasone-Umeclidin-Vilant (TRELEGY ELLIPTA) 100-62.5-25 MCG/INH AEPB  Semaglutide,0.25 or 0.5MG /DOS, (OZEMPIC, 0.25 OR 0.5 MG/DOSE,) 2 MG/1.5ML SOPN    Pharmacy:  [place], [place] Phone:  [phone]  Fax:  [phone]

## 2019-06-04 NOTE — Telephone Encounter (Signed)
Routing to provider. Looks like patient is only supposed to be taking the 80 mg Atorvastatin daily correct??  Please advise on 90 day supplies.

## 2019-06-04 NOTE — Telephone Encounter (Signed)
Called and discussed medications with patient.

## 2019-06-04 NOTE — Telephone Encounter (Signed)
[facility]  YES, only Atorvastatin 80 MG daily and script says that.

## 2019-06-06 ENCOUNTER — Other Ambulatory Visit: Payer: Self-pay | Admitting: Nurse Practitioner

## 2019-06-06 ENCOUNTER — Telehealth: Payer: Self-pay | Admitting: Nurse Practitioner

## 2019-06-06 MED ORDER — OZEMPIC (0.25 OR 0.5 MG/DOSE) 2 MG/1.5ML ~~LOC~~ SOPN: 0.5000 mg | PEN_INJECTOR | SUBCUTANEOUS | 5 refills | Status: DC

## 2019-06-06 NOTE — Telephone Encounter (Signed)
Requested Prescriptions  Pending Prescriptions Disp Refills  . Semaglutide,0.25 or 0.5MG /DOS, (OZEMPIC, 0.25 OR 0.5 MG/DOSE,) 2 MG/1.5ML SOPN 3 pen 5    Sig: Inject 0.5 mg into the skin once a week.     There is no refill protocol information for this order    . Fluticasone-Umeclidin-Vilant (TRELEGY ELLIPTA) 100-62.5-25 MCG/INH AEPB 180 each 3    Sig: Inhale 1 puff into the lungs daily.     There is no refill protocol information for this order     Requested medication (s) are due for refill today:  Semaglutide, yes  Requested medication (s) are on the active medication list: yes  Last refill: 08/01/2018  Future visit scheduled: yes  Notes to clinic: no assigned protocol; [facility] requests 90 day supply  Requested medication (s) are due for refill today:  Fluticasone-Umeclidin-Vilant, yes  Requested medication (s) are on the active medication list: yes  Last refill:  06/04/19  Future visit scheduled:yes  Notes to clinic: no assigned protocol; [facility] requests 90 day supply

## 2019-06-06 NOTE — Telephone Encounter (Signed)
[employer] requesting the following medication refill, informed please allow 48 to 72 hour turn around time  Reason for call:  90 day supply (6 pens) Semaglutide,0.25 or 0.5MG /DOS, (OZEMPIC, 0.25 OR 0.5 MG/DOSE,) 2 MG/1.5ML SOPN , 90 day supply (quanity 180) Fluticasone-Umeclidin-Vilant (TRELEGY ELLIPTA) 100-62.5-25 MCG/INH [place], [place] Phone:  [phone]  Fax:  [phone]

## 2019-06-06 NOTE — Telephone Encounter (Signed)
Sent Trelegy on 06/04/2019 to [place], sent Ozempic today.

## 2019-06-11 DIAGNOSIS — E119 Type 2 diabetes mellitus without complications: Secondary | ICD-10-CM | POA: Diagnosis not present

## 2019-06-11 DIAGNOSIS — H2513 Age-related nuclear cataract, bilateral: Secondary | ICD-10-CM | POA: Diagnosis not present

## 2019-06-11 DIAGNOSIS — H353131 Nonexudative age-related macular degeneration, bilateral, early dry stage: Secondary | ICD-10-CM | POA: Diagnosis not present

## 2019-06-11 DIAGNOSIS — H35013 Changes in retinal vascular appearance, bilateral: Secondary | ICD-10-CM | POA: Diagnosis not present

## 2019-06-11 LAB — HM DIABETES EYE EXAM

## 2019-06-12 ENCOUNTER — Other Ambulatory Visit: Payer: Self-pay

## 2019-06-12 ENCOUNTER — Ambulatory Visit
Admission: RE | Admit: 2019-06-12 | Discharge: 2019-06-12 | Disposition: A | Discharge status: Home / Self Care | Payer: Medicare Other | Source: Ambulatory Visit | Attending: Orthopedic Surgery | Admitting: Orthopedic Surgery

## 2019-06-12 DIAGNOSIS — M19011 Primary osteoarthritis, right shoulder: Secondary | ICD-10-CM | POA: Diagnosis not present

## 2019-06-13 ENCOUNTER — Other Ambulatory Visit: Payer: Self-pay | Admitting: Nurse Practitioner

## 2019-06-17 ENCOUNTER — Other Ambulatory Visit: Payer: Self-pay

## 2019-06-17 ENCOUNTER — Encounter: Payer: Self-pay | Admitting: Nurse Practitioner

## 2019-06-17 ENCOUNTER — Ambulatory Visit (INDEPENDENT_AMBULATORY_CARE_PROVIDER_SITE_OTHER): Payer: Medicare Other | Admitting: Nurse Practitioner

## 2019-06-17 VITALS — BP 135/84 | HR 72 | Temp 98.0°F | Ht 67.0 in | Wt 213.0 lb

## 2019-06-17 DIAGNOSIS — R21 Rash and other nonspecific skin eruption: Secondary | ICD-10-CM

## 2019-06-17 DIAGNOSIS — D692 Other nonthrombocytopenic purpura: Secondary | ICD-10-CM | POA: Diagnosis not present

## 2019-06-17 DIAGNOSIS — Z01818 Encounter for other preprocedural examination: Secondary | ICD-10-CM

## 2019-06-17 DIAGNOSIS — R8281 Pyuria: Secondary | ICD-10-CM | POA: Diagnosis not present

## 2019-06-17 DIAGNOSIS — R7301 Impaired fasting glucose: Secondary | ICD-10-CM | POA: Diagnosis not present

## 2019-06-17 MED ORDER — FLUCONAZOLE 150 MG PO TABS: 150.0000 mg | ORAL_TABLET | Freq: Once | ORAL | 0 refills | Status: AC

## 2019-06-17 MED ORDER — MELOXICAM 15 MG PO TABS: 15.0000 mg | ORAL_TABLET | Freq: Every day | ORAL | 3 refills | Status: DC

## 2019-06-17 NOTE — Progress Notes (Signed)
BP 135/84   Pulse 72   Temp 98 F (36.7 C) (Oral)   Ht 5\' 7"  (1.702 m)   Wt 213 lb (96.6 kg)   SpO2 98%   BMI 33.36 kg/m    Subjective:    Patient ID: Stacey Gross, female    DOB: 07/03/1960, 59 y.o.   MRN: [id number]  HPI: Stacey Gross is a 59 y.o. female  Chief Complaint  Patient presents with  . Surgical clearance    right shoulder replacement.  . Medication Refill    meloxicam   PRE-OP CLEARANCE: Is scheduled for right total shoulder replacement on 07/24/2019 with Dr. Marica Otter.  She reports having all pre op needs done with exception of Covid.  Here today for pre op testing, no concerns.    RASH Started 4 weeks ago, tried [place] with minimal relief.  Clobetasol ordered last visit, along with Vistaril -- this helps some.  Tried oatmeal bath, did not work. No recent medication changes, other than Losartan, which was started after rash appeared. She feels it may be her nerves as she has some anxiety with upcoming surgery. Duration:  weeks  Location: back  Itching: yes Burning: yes Redness: yes Oozing: no Scaling: yes Blisters: no Painful: no Fevers: no Change in detergents/soaps/personal care products: no Recent illness: no Recent travel:no History of same: no Context: worse Alleviating factors: benadryl Treatments attempted:hydrocortisone cream, benadryl and lotion/moisturizer Shortness of breath: no  Throat/tongue swelling: no Myalgias/arthralgias: no  Relevant past medical, surgical, family and social history reviewed and updated as indicated. Interim medical history since our last visit reviewed. Allergies and medications reviewed and updated.  Review of Systems  Constitutional: Negative for activity change, appetite change, diaphoresis, fatigue and fever.  Respiratory: Negative for cough, chest tightness and shortness of breath.   Cardiovascular: Negative for chest pain, palpitations and leg swelling.  Skin: Positive for rash.  Psychiatric/Behavioral:  Negative.     Per HPI unless specifically indicated above     Objective:    BP 135/84   Pulse 72   Temp 98 F (36.7 C) (Oral)   Ht 5\' 7"  (1.702 m)   Wt 213 lb (96.6 kg)   SpO2 98%   BMI 33.36 kg/m   Wt Readings from Last 3 Encounters:  06/17/19 213 lb (96.6 kg)  05/30/19 214 lb (97.1 kg)  05/14/19 214 lb (97.1 kg)    Physical Exam Vitals and nursing note reviewed.  Constitutional:      General: She is awake. She is not in acute distress.    Appearance: She is well-developed. She is obese. She is not ill-appearing.  HENT:     Head: Normocephalic.     Right Ear: Hearing normal.     Left Ear: Hearing normal.  Eyes:     General: Lids are normal.        Right eye: No discharge.        Left eye: No discharge.     Conjunctiva/sclera: Conjunctivae normal.     Pupils: Pupils are equal, round, and reactive to light.  Cardiovascular:     Rate and Rhythm: Normal rate and regular rhythm.     Heart sounds: Normal heart sounds. No murmur. No gallop.   Pulmonary:     Effort: Pulmonary effort is normal. No accessory muscle usage or respiratory distress.     Breath sounds: Normal breath sounds.  Abdominal:     General: Bowel sounds are normal.     Palpations: Abdomen is  soft. There is no hepatomegaly or splenomegaly.  Musculoskeletal:     Cervical back: Normal range of motion and neck supple.     Right lower leg: No edema.     Left lower leg: No edema.  Skin:    General: Skin is warm and dry.          Comments: Dry skin on arms and legs.   Neurological:     Mental Status: She is alert and oriented to person, place, and time.  Psychiatric:        Attention and Perception: Attention normal.        Mood and Affect: Mood normal.        Speech: Speech normal.        Behavior: Behavior normal. Behavior is cooperative.    EKG performed in office with NSR, rate 76, normal axis.  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  Recommend gentle skin cleansing daily and application of lotion daily.  Monitor skin and notify provider if any abrasions or wounds.        Musculoskeletal and Integument   Rash    Acute, improving.  Pruritic in nature.  Continue Sarna lotion to arms as needed.  Continue Clobetasol to area and Nystatin as needed.  Hydroxyzine for pruritus as needed + recommend taking daily Claritin or Allegra.  Recommend transition to less hot showers and baths.  Continue oatmeal baths at home.  Return for worsening or continues issues.       Other Visit Diagnoses    Pre-op evaluation       Preop EKG, BMP, A1C, CBC, urinalysis obtained.   Relevant Orders   CBC with Differential/Platelet   Basic metabolic panel   UA/M w/rflx Culture, Routine   Bayer DCA Hb A1c Waived   EKG 12-Lead (Completed)       Follow up plan: Return for as schedule upcoming.

## 2019-06-17 NOTE — Assessment & Plan Note (Signed)
Acute, improving.  Pruritic in nature.  Continue Sarna lotion to arms as needed.  Continue Clobetasol to area and Nystatin as needed.  Hydroxyzine for pruritus as needed + recommend taking daily Claritin or Allegra.  Recommend transition to less hot showers and baths.  Continue oatmeal baths at home.  Return for worsening or continues issues.

## 2019-06-17 NOTE — Patient Instructions (Signed)
Rash, Adult  A rash is a change in the color of your skin. A rash can also change the way your skin feels. There are many different conditions and factors that can cause a rash. Follow these instructions at home: The goal of treatment is to stop the itching and keep the rash from spreading. Watch for any changes in your symptoms. Let your doctor know about them. Follow these instructions to help with your condition: Medicine Take or apply over-the-counter and prescription medicines only as told by your doctor. These may include medicines:  To treat red or swollen skin (corticosteroid creams).  To treat itching.  To treat an allergy (oral antihistamines).  To treat very bad symptoms (oral corticosteroids).  Skin care  Put cool cloths (compresses) on the affected areas.  Do not scratch or rub your skin.  Avoid covering the rash. Make sure that the rash is exposed to air as much as possible. Managing itching and discomfort  Avoid hot showers or baths. These can make itching worse. A cold shower may help.  Try taking a bath with: ? Epsom salts. You can get these at your local pharmacy or grocery store. Follow the instructions on the package. ? Baking soda. Pour a small amount into the bath as told by your doctor. ? Colloidal oatmeal. You can get this at your local pharmacy or grocery store. Follow the instructions on the package.  Try putting baking soda paste onto your skin. Stir water into baking soda until it gets like a paste.  Try putting on a lotion that relieves itchiness (calamine lotion).  Keep cool and out of the sun. Sweating and being hot can make itching worse. General instructions   Rest as needed.  Drink enough fluid to keep your pee (urine) pale yellow.  Wear loose-fitting clothing.  Avoid scented soaps, detergents, and perfumes. Use gentle soaps, detergents, perfumes, and other cosmetic products.  Avoid anything that causes your rash. Keep a journal to  help track what causes your rash. Write down: ? What you eat. ? What cosmetic products you use. ? What you drink. ? What you wear. This includes jewelry.  Keep all follow-up visits as told by your doctor. This is important. Contact a doctor if:  You sweat at night.  You lose weight.  You pee (urinate) more than normal.  You pee less than normal, or you notice that your pee is a darker color than normal.  You feel weak.  You throw up (vomit).  Your skin or the whites of your eyes look yellow (jaundice).  Your skin: ? Tingles. ? Is numb.  Your rash: ? Does not go away after a few days. ? Gets worse.  You are: ? More thirsty than normal. ? More tired than normal.  You have: ? New symptoms. ? Pain in your belly (abdomen). ? A fever. ? Watery poop (diarrhea). Get help right away if:  You have a fever and your symptoms suddenly get worse.  You start to feel mixed up (confused).  You have a very bad headache or a stiff neck.  You have very bad joint pains or stiffness.  You have jerky movements that you cannot control (seizure).  Your rash covers all or most of your body. The rash may or may not be painful.  You have blisters that: ? Are on top of the rash. ? Grow larger. ? Grow together. ? Are painful. ? Are inside your nose or mouth.  You have a rash   that: ? Looks like purple pinprick-sized spots all over your body. ? Has a "bull's eye" or looks like a target. ? Is red and painful, causes your skin to peel, and is not from being in the sun too long. Summary  A rash is a change in the color of your skin. A rash can also change the way your skin feels.  The goal of treatment is to stop the itching and keep the rash from spreading.  Take or apply over-the-counter and prescription medicines only as told by your doctor.  Contact a doctor if you have new symptoms or symptoms that get worse.  Keep all follow-up visits as told by your doctor. This is  important. This information is not intended to replace advice given to you by your health care provider. Make sure you discuss any questions you have with your health care provider. Document Revised: 07/13/2018 Document Reviewed: 10/23/2017 Elsevier Patient Education  2020 [employer]  

## 2019-06-17 NOTE — Addendum Note (Signed)
Addended by: Marnee Guarneri T on: 06/17/2019 04:53 PM   Modules accepted: Orders

## 2019-06-17 NOTE — Assessment & Plan Note (Signed)
Bilateral upper extremity.  Recommend gentle skin cleansing daily and application of lotion daily.  Monitor skin and notify provider if any abrasions or wounds. 

## 2019-06-18 ENCOUNTER — Telehealth: Payer: Self-pay

## 2019-06-18 LAB — CBC WITH DIFFERENTIAL/PLATELET
Basophils Absolute: 0.1 10*3/uL (ref 0.0–0.2)
Basos: 1 %
EOS (ABSOLUTE): 0.1 10*3/uL (ref 0.0–0.4)
Eos: 1 %
Hematocrit: 39.7 % (ref 34.0–46.6)
Hemoglobin: 13.7 g/dL (ref 11.1–15.9)
Immature Grans (Abs): 0.1 10*3/uL (ref 0.0–0.1)
Immature Granulocytes: 1 %
Lymphocytes Absolute: 2.4 10*3/uL (ref 0.7–3.1)
Lymphs: 28 %
MCH: 30.9 pg (ref 26.6–33.0)
MCHC: 34.5 g/dL (ref 31.5–35.7)
MCV: 89 fL (ref 79–97)
Monocytes Absolute: 0.5 10*3/uL (ref 0.1–0.9)
Monocytes: 5 %
Neutrophils Absolute: 5.6 10*3/uL (ref 1.4–7.0)
Neutrophils: 64 %
Platelets: 277 10*3/uL (ref 150–450)
RBC: 4.44 x10E6/uL (ref 3.77–5.28)
RDW: 11.8 % (ref 11.7–15.4)
WBC: 8.7 10*3/uL (ref 3.4–10.8)

## 2019-06-18 LAB — BASIC METABOLIC PANEL
BUN/Creatinine Ratio: 12 (ref 12–28)
BUN: 13 mg/dL (ref 8–27)
CO2: 23 mmol/L (ref 20–29)
Calcium: 9.7 mg/dL (ref 8.7–10.3)
Chloride: 100 mmol/L (ref 96–106)
Creatinine, Ser: 1.06 mg/dL — ABNORMAL HIGH (ref 0.57–1.00)
GFR calc Af Amer: 65 mL/min/{1.73_m2} (ref >59)
GFR calc non Af Amer: 57 mL/min/{1.73_m2} — ABNORMAL LOW (ref >59)
Glucose: 87 mg/dL (ref 65–99)
Potassium: 4.8 mmol/L (ref 3.5–5.2)
Sodium: 138 mmol/L (ref 134–144)

## 2019-06-18 NOTE — Telephone Encounter (Signed)
Surgical clearance form, notes, labs and ECG faxed over to [facility]

## 2019-06-18 NOTE — Progress Notes (Signed)
Please let Ya know overall labs are good, will continue to monitor and fax pre op release form.  Have a good day.

## 2019-06-19 LAB — MICROSCOPIC EXAMINATION
Bacteria, UA: NONE SEEN
RBC, Urine: NONE SEEN /hpf (ref 0–2)

## 2019-06-19 LAB — UA/M W/RFLX CULTURE, ROUTINE
Bilirubin, UA: NEGATIVE
Glucose, UA: NEGATIVE
Ketones, UA: NEGATIVE
Nitrite, UA: NEGATIVE
Protein,UA: NEGATIVE
RBC, UA: NEGATIVE
Specific Gravity, UA: 1.015 (ref 1.005–1.030)
Urobilinogen, Ur: 0.2 mg/dL (ref 0.2–1.0)
pH, UA: 6 (ref 5.0–7.5)

## 2019-06-19 LAB — BAYER DCA HB A1C WAIVED: HB A1C (BAYER DCA - WAIVED): 6.2 % (ref <7.0)

## 2019-06-19 LAB — URINE CULTURE, REFLEX

## 2019-06-25 ENCOUNTER — Telehealth: Payer: Self-pay | Admitting: Nurse Practitioner

## 2019-06-25 DIAGNOSIS — R21 Rash and other nonspecific skin eruption: Secondary | ICD-10-CM

## 2019-06-25 NOTE — Telephone Encounter (Signed)
Copied from [place] [phone]. Topic: Referral - Request for Referral >> Jun 25, 2019  1:17 PM Alanda Slim E wrote: Has patient seen PCP for this complaint? Yes  *If NO, is insurance requiring patient see PCP for this issue before PCP can refer them? Referral for which specialty:Dermatology  Preferred provider/office:  Reason for referral: rash on back and arms /Pt stated she spoke with Ade Stmarie about making her a dermatology appt / please advise

## 2019-06-25 NOTE — Telephone Encounter (Signed)
No answer. LVM that will try her again tomorrow morning.

## 2019-06-25 NOTE — Telephone Encounter (Signed)
Referral to dermatology sent.

## 2019-06-26 NOTE — Telephone Encounter (Signed)
Patient notified of referral being entered.

## 2019-07-01 ENCOUNTER — Other Ambulatory Visit: Payer: Self-pay

## 2019-07-01 ENCOUNTER — Ambulatory Visit: Payer: Medicare Other | Admitting: Dermatology

## 2019-07-01 DIAGNOSIS — L300 Nummular dermatitis: Secondary | ICD-10-CM

## 2019-07-01 DIAGNOSIS — L820 Inflamed seborrheic keratosis: Secondary | ICD-10-CM | POA: Diagnosis not present

## 2019-07-01 DIAGNOSIS — L280 Lichen simplex chronicus: Secondary | ICD-10-CM

## 2019-07-01 MED ORDER — HYDROXYZINE HCL 25 MG PO TABS: 25.0000 mg | ORAL_TABLET | ORAL | 1 refills | Status: DC | PRN

## 2019-07-01 MED ORDER — TRIAMCINOLONE ACETONIDE 0.1 % EX CREA: 1.0000 "application " | TOPICAL_CREAM | Freq: Two times a day (BID) | CUTANEOUS | 1 refills | Status: DC

## 2019-07-01 NOTE — Progress Notes (Signed)
   New Patient Visit  Subjective  Stacey Gross is a 60 y.o. female who presents for the following: Rash (Back, R hip, L arm. Present for 1 month or longer. Itchy. Tried clobetasol with no improvement. Using [employer], [employer].).   Objective  Well appearing patient in no apparent distress; mood and affect are within normal limits.  A focused examination was performed including back, arms. Relevant physical exam findings are noted in the Assessment and Plan.  Objective  Left Anterior Shoulder: Erythematous keratotic waxy stuck-on papule. With surrounding erythema. Itchy  Objective  Spinal Mid Back, L spinal lower back: Lichenified pink plaques.  Objective  Back: Light pink scaly patches and xerosis.  Assessment & Plan  Inflamed seborrheic keratosis Left Anterior Shoulder  Destruction of lesion - Left Anterior Shoulder  Destruction method: cryotherapy   Informed consent: discussed and consent obtained   Lesion destroyed using liquid nitrogen: Yes   Region frozen until ice ball extended beyond lesion: Yes   Outcome: patient tolerated procedure well with no complications   Post-procedure details: wound care instructions given    Lichen simplex chronicus Spinal Mid Back, L spinal lower back  Kenalog 5 mg/cc IL injections performed to spinal mid back and L spinal lower back. TV = 1.0 cc.  >7 injections  Lot No. [id number] Exp 07/2020  hydrOXYzine (ATARAX/VISTARIL) 25 MG tablet - Spinal Mid Back, L spinal lower back  Nummular dermatitis Back  Recommend mild soap and moisturizing cream 1-2 times daily.    triamcinolone cream (KENALOG) 0.1 % - Back  Return in about 2 weeks (around 07/15/2019).   IJamesetta Orleans, CMA, am acting as scribe for Brendolyn Patty, MD .

## 2019-07-01 NOTE — Patient Instructions (Signed)
Cryotherapy Aftercare  . Wash gently with soap and water everyday.   . Apply Vaseline and Band-Aid daily until healed.  

## 2019-07-09 ENCOUNTER — Other Ambulatory Visit: Payer: Self-pay | Admitting: Orthopedic Surgery

## 2019-07-09 ENCOUNTER — Encounter: Payer: Self-pay | Admitting: Orthopedic Surgery

## 2019-07-09 DIAGNOSIS — M25611 Stiffness of right shoulder, not elsewhere classified: Secondary | ICD-10-CM | POA: Diagnosis not present

## 2019-07-09 DIAGNOSIS — M25511 Pain in right shoulder: Secondary | ICD-10-CM | POA: Diagnosis not present

## 2019-07-09 NOTE — H&P (Signed)
NAME: Stacey Gross MRN:   [id number] DOB:   [birth year]     HISTORY AND PHYSICAL  CHIEF COMPLAINT:  Right shoulder pain  HISTORY:   Stacey Gross a 59 y.o. female  with right  Shoulder Pain Patient complains of right shoulder pain. The symptoms began several years ago. Aggravating factors: repetitive activit. Pain is located in the right glenohumeral region. Discomfort is described as aching, burning, sharp/stabbing and throbbing. Symptoms are exacerbated by repetitive movements. Evaluation to date: plain films: abnormal osteoarthritis and MRI: abnormal osteoarthritis. Therapy to date includes: rest, ice, avoidance of offending activity, OTC analgesics which are somewhat effective, prescription NSAIDS which are somewhat effective, opioids which are somewhat effective, home exercises which are somewhat effective, physical therapy which was somewhat effective and corticosteroid injection which was somewhat effective.    Plan for right total shoulder replacement  PAST MEDICAL HISTORY:   Past Medical History:  Diagnosis Date  . Asthma   . Cancer ([place])    stomach  . Chronic back pain   . COPD (chronic obstructive pulmonary disease) ([place])   . Degenerative joint disease (DJD) of hip   . Diabetes mellitus without complication ([place])   . Dyspnea   . GERD (gastroesophageal reflux disease)    takes Omeprazole daily  . History of bronchitis yrs ago  . History of colon polyps    benign  . History of staph infection 10 yrs ago  . Hyperlipidemia    takes Atorvastatin daily  . Hypertension   . Peripheral vascular disease ([place])   . Tobacco use    8 cigarettes daily   . Weakness    numbness and tingling in both legs     PAST SURGICAL HISTORY:   Past Surgical History:  Procedure Laterality Date  . ABDOMINAL HYSTERECTOMY     total  . APPENDECTOMY    . BACK SURGERY  9-10 yrs ago  . BREAST BIOPSY Right    Core- neg  . COLONOSCOPY    . COLONOSCOPY WITH PROPOFOL N/A 09/20/2017   Procedure:  COLONOSCOPY WITH PROPOFOL;  Surgeon: Virgel Manifold, MD;  Location: [facility] ENDOSCOPY;  Service: Endoscopy;  Laterality: N/A;  . COLONOSCOPY WITH PROPOFOL N/A 04/24/2018   Procedure: COLONOSCOPY WITH PROPOFOL;  Surgeon: Lucilla Lame, MD;  Location: [facility] ENDOSCOPY;  Service: Endoscopy;  Laterality: N/A;  . ESOPHAGOGASTRODUODENOSCOPY (EGD) WITH PROPOFOL N/A 09/20/2017   Procedure: ESOPHAGOGASTRODUODENOSCOPY (EGD) WITH PROPOFOL;  Surgeon: Virgel Manifold, MD;  Location: [facility] ENDOSCOPY;  Service: Endoscopy;  Laterality: N/A;  . ESOPHAGOGASTRODUODENOSCOPY (EGD) WITH PROPOFOL N/A 11/20/2018   Procedure: ESOPHAGOGASTRODUODENOSCOPY (EGD) WITH PROPOFOL;  Surgeon: Lucilla Lame, MD;  Location: [facility] ENDOSCOPY;  Service: Endoscopy;  Laterality: N/A;  . PERIPHERAL VASCULAR CATHETERIZATION Left 03/17/2016   Procedure: Lower Extremity Angiography;  Surgeon: Algernon Huxley, MD;  Location: [facility];  Service: Cardiovascular;  Laterality: Left;    MEDICATIONS:  (Not in a hospital admission)   ALLERGIES:   Allergies  Allergen Reactions  . Doxycycline Hyclate Anaphylaxis, Swelling and Rash  . Latex Rash  . Vancomycin Rash    All mycins    REVIEW OF SYSTEMS:   Negative except shoulder  FAMILY HISTORY:   Family History  Problem Relation Age of Onset  . Hyperlipidemia Mother   . Hyperlipidemia Father   . Melanoma Sister   . Breast cancer Neg Hx     SOCIAL HISTORY:   reports that she has been smoking cigarettes. She has a 48.00 pack-year smoking history. She  has never used smokeless tobacco. She reports that she does not drink alcohol or use drugs.  PHYSICAL EXAM:  General appearance: alert, cooperative and no distress Neck: no JVD, supple, symmetrical, trachea midline and thyroid not enlarged, symmetric, no tenderness/mass/nodules Resp: clear to auscultation bilaterally Cardio: regular rate and rhythm, S1, S2 normal, no murmur, click, rub or gallop GI: soft, non-tender; bowel sounds  normal; no masses,  no organomegaly Extremities: extremities normal, atraumatic, no cyanosis or edema Pulses: 2+ and symmetric Skin: Skin color, texture, turgor normal. No rashes or lesions Neurologic: Alert and oriented X 3, normal strength and tone. Normal symmetric reflexes. Normal coordination and gait    LABORATORY STUDIES: No results for input(s): WBC, HGB, HCT, PLT in the last 72 hours.  No results for input(s): NA, K, CL, CO2, GLUCOSE, BUN, CREATININE, CALCIUM in the last 72 hours.  STUDIES/RESULTS:  CT SHOULDER RIGHT WO CONTRAST  Result Date: 06/13/2019 CLINICAL DATA:  Chronic right shoulder pain and limited range of motion. Preoperative planning exam. EXAM: CT OF THE UPPER RIGHT EXTREMITY WITHOUT CONTRAST TECHNIQUE: Multidetector CT imaging of the upper right extremity was performed according to the standard protocol. COMPARISON:  None. FINDINGS: Bones/Joint/Cartilage The patient has glenohumeral osteoarthritis with joint space narrowing and osteophytosis about both the glenoid and off the inferior margin of the humeral head. A single small subchondral cyst is seen in the posterior aspect of the glenoid. No loose body is identified in the joint. Moderate acromioclavicular osteoarthritis is present. The acromion is type 2. No lytic or sclerotic lesion. No fracture. Ligaments Suboptimally assessed by CT. Muscles and Tendons The rotator cuff appears intact. No muscular atrophy is identified. Soft tissues Imaged intrathoracic contents demonstrate paraseptal and centrilobular emphysema. Imaged right lung is clear. No pleural effusion. IMPRESSION: Glenohumeral and acromioclavicular osteoarthritis as described. The rotator cuff appears intact. Emphysema. Electronically Signed   By: Inge Rise M.D.   On: 06/13/2019 09:31    ASSESSMENT:  End stage osteoarthritis right hip        Active Problems:   * No active hospital problems. *    PLAN:  Right total shoulder arthroplasty   Carlynn Spry PA-C 07/09/2019. 10:05 AM

## 2019-07-11 ENCOUNTER — Other Ambulatory Visit: Payer: Self-pay | Admitting: Orthopedic Surgery

## 2019-07-11 ENCOUNTER — Encounter: Payer: Self-pay | Admitting: Orthopedic Surgery

## 2019-07-11 NOTE — H&P (Signed)
NAME: Stacey Gross:   [id number] DOB:   27-Mar-1961     HISTORY AND PHYSICAL  CHIEF COMPLAINT:  Right shoulder pain  HISTORY:   Stacey Hedeen Shephardis a 59 y.o. female  with right  Shoulder Pain Patient complains of right shoulder pain. The symptoms began several years ago. Aggravating factors: repetitive activity. Pain is located in the right glenohumeral region. Discomfort is described as aching, burning and sharp/stabbing. Symptoms are exacerbated by repetitive movements, overhead movements and lying on the shoulder. Evaluation to date: plain films: abnormal glenohumeral osteoarthritis. Therapy to date includes: rest, ice, avoidance of offending activity, OTC analgesics which are not very effective, prescription NSAIDS which are not very effective, home exercises which are not very effective and corticosteroid injection which was not very effective.   Plan for Right total shoulder arthroplasty.  PAST MEDICAL HISTORY:   Past Medical History:  Diagnosis Date  . Asthma   . Cancer ([place])    stomach  . Chronic back pain   . COPD (chronic obstructive pulmonary disease) ([place])   . Degenerative joint disease (DJD) of hip   . Diabetes mellitus without complication ([place])   . Dyspnea   . GERD (gastroesophageal reflux disease)    takes Omeprazole daily  . History of bronchitis yrs ago  . History of colon polyps    benign  . History of staph infection 10 yrs ago  . Hyperlipidemia    takes Atorvastatin daily  . Hypertension   . Peripheral vascular disease ([place])   . Tobacco use    8 cigarettes daily   . Weakness    numbness and tingling in both legs     PAST SURGICAL HISTORY:   Past Surgical History:  Procedure Laterality Date  . ABDOMINAL HYSTERECTOMY     total  . APPENDECTOMY    . BACK SURGERY  9-10 yrs ago  . BREAST BIOPSY Right    Core- neg  . COLONOSCOPY    . COLONOSCOPY WITH PROPOFOL N/A 09/20/2017   Procedure: COLONOSCOPY WITH PROPOFOL;  Surgeon: Virgel Manifold, MD;   Location: [facility] ENDOSCOPY;  Service: Endoscopy;  Laterality: N/A;  . COLONOSCOPY WITH PROPOFOL N/A 04/24/2018   Procedure: COLONOSCOPY WITH PROPOFOL;  Surgeon: Lucilla Lame, MD;  Location: [facility] ENDOSCOPY;  Service: Endoscopy;  Laterality: N/A;  . ESOPHAGOGASTRODUODENOSCOPY (EGD) WITH PROPOFOL N/A 09/20/2017   Procedure: ESOPHAGOGASTRODUODENOSCOPY (EGD) WITH PROPOFOL;  Surgeon: Virgel Manifold, MD;  Location: [facility] ENDOSCOPY;  Service: Endoscopy;  Laterality: N/A;  . ESOPHAGOGASTRODUODENOSCOPY (EGD) WITH PROPOFOL N/A 11/20/2018   Procedure: ESOPHAGOGASTRODUODENOSCOPY (EGD) WITH PROPOFOL;  Surgeon: Lucilla Lame, MD;  Location: [facility] ENDOSCOPY;  Service: Endoscopy;  Laterality: N/A;  . PERIPHERAL VASCULAR CATHETERIZATION Left 03/17/2016   Procedure: Lower Extremity Angiography;  Surgeon: Algernon Huxley, MD;  Location: [facility];  Service: Cardiovascular;  Laterality: Left;    MEDICATIONS:  (Not in a hospital admission)   ALLERGIES:   Allergies  Allergen Reactions  . Doxycycline Hyclate Anaphylaxis, Swelling and Rash  . Latex Rash  . Vancomycin Rash    All mycins    REVIEW OF SYSTEMS:   Negative except HPI  FAMILY HISTORY:   Family History  Problem Relation Age of Onset  . Hyperlipidemia Mother   . Hyperlipidemia Father   . Melanoma Sister   . Breast cancer Neg Hx     SOCIAL HISTORY:   reports that she has been smoking cigarettes. She has a 48.00 pack-year smoking history. She has never used smokeless tobacco.  She reports that she does not drink alcohol or use drugs.  PHYSICAL EXAM:  General appearance: alert, cooperative and no distress Neck: no JVD and supple, symmetrical, trachea midline Resp: clear to auscultation bilaterally Cardio: regular rate and rhythm, S1, S2 normal, no murmur, click, rub or gallop GI: soft, non-tender; bowel sounds normal; no masses,  no organomegaly Extremities: extremities normal, atraumatic, no cyanosis or edema Pulses: 2+ and  symmetric Skin: Skin color, texture, turgor normal. No rashes or lesions Neurologic: Alert and oriented X 3, normal strength and tone. Normal symmetric reflexes. Normal coordination and gait    LABORATORY STUDIES: No results for input(s): WBC, HGB, HCT, PLT in the last 72 hours.  No results for input(s): NA, K, CL, CO2, GLUCOSE, BUN, CREATININE, CALCIUM in the last 72 hours.  STUDIES/RESULTS:  CT SHOULDER RIGHT WO CONTRAST  Result Date: 06/13/2019 CLINICAL DATA:  Chronic right shoulder pain and limited range of motion. Preoperative planning exam. EXAM: CT OF THE UPPER RIGHT EXTREMITY WITHOUT CONTRAST TECHNIQUE: Multidetector CT imaging of the upper right extremity was performed according to the standard protocol. COMPARISON:  None. FINDINGS: Bones/Joint/Cartilage The patient has glenohumeral osteoarthritis with joint space narrowing and osteophytosis about both the glenoid and off the inferior margin of the humeral head. A single small subchondral cyst is seen in the posterior aspect of the glenoid. No loose body is identified in the joint. Moderate acromioclavicular osteoarthritis is present. The acromion is type 2. No lytic or sclerotic lesion. No fracture. Ligaments Suboptimally assessed by CT. Muscles and Tendons The rotator cuff appears intact. No muscular atrophy is identified. Soft tissues Imaged intrathoracic contents demonstrate paraseptal and centrilobular emphysema. Imaged right lung is clear. No pleural effusion. IMPRESSION: Glenohumeral and acromioclavicular osteoarthritis as described. The rotator cuff appears intact. Emphysema. Electronically Signed   By: Inge Rise M.D.   On: 06/13/2019 09:31    ASSESSMENT:   osteoarthritis right shoulder        Active Problems:   * No active hospital problems. *    PLAN:  Right total shoulder arthroplasty   Carlynn Spry PA-C 07/11/2019. 11:04 AM

## 2019-07-18 ENCOUNTER — Other Ambulatory Visit: Payer: Self-pay | Admitting: Orthopedic Surgery

## 2019-07-18 ENCOUNTER — Inpatient Hospital Stay: Admission: RE | Admit: 2019-07-18 | Payer: Medicare Other | Source: Ambulatory Visit

## 2019-07-19 ENCOUNTER — Other Ambulatory Visit: Payer: Self-pay

## 2019-07-19 ENCOUNTER — Encounter
Admission: RE | Admit: 2019-07-19 | Discharge: 2019-07-19 | Disposition: A | Discharge status: Home / Self Care | Payer: Medicare Other | Source: Ambulatory Visit | Attending: Orthopedic Surgery | Admitting: Orthopedic Surgery

## 2019-07-19 ENCOUNTER — Ambulatory Visit: Payer: Medicare Other | Admitting: Dermatology

## 2019-07-19 DIAGNOSIS — L300 Nummular dermatitis: Secondary | ICD-10-CM

## 2019-07-19 DIAGNOSIS — L820 Inflamed seborrheic keratosis: Secondary | ICD-10-CM

## 2019-07-19 DIAGNOSIS — L280 Lichen simplex chronicus: Secondary | ICD-10-CM | POA: Diagnosis not present

## 2019-07-19 DIAGNOSIS — Z01812 Encounter for preprocedural laboratory examination: Secondary | ICD-10-CM | POA: Insufficient documentation

## 2019-07-19 NOTE — Patient Instructions (Signed)
Your procedure is scheduled on: Wednesday July 24, 2019 Report to Day Surgery. To find out your arrival time please call [phone] between 1PM - 3PM on Tuesday July 23, 2019.  Remember: Instructions that are not followed completely may result in serious medical risk,  up to and including death, or upon the discretion of your surgeon and anesthesiologist your  surgery may need to be rescheduled.     _X__ 1. Do not eat food after midnight the night before your procedure.                 No gum chewing or hard candies. You may drink clear liquids up to 2 hours                 before you are scheduled to arrive for your surgery- DO not drink clear                 liquids within 2 hours of the start of your surgery.                 Clear Liquids include:  water, apple juice without pulp, clear Gatorade, G2 or                  Gatorade Zero (avoid Red/Purple/Blue), Black Coffee or Tea (Do not add                 anything to coffee or tea).  __X__2.  On the morning of surgery brush your teeth with toothpaste and water, you                may rinse your mouth with mouthwash if you wish.  Do not swallow any toothpaste of mouthwash.     _X__ 3.  No Alcohol for 24 hours before or after surgery.   _X__ 4.  Do Not Smoke or use e-cigarettes For 24 Hours Prior to Your Surgery.                 Do not use any chewable tobacco products for at least 6 hours prior to                 surgery.  __X__ 5.  Notify your doctor if there is any change in your medical condition      (cold, fever, infections).     Do not wear jewelry, make-up, hairpins, clips or nail polish. Do not wear lotions, powders, or perfumes. You may wear deodorant. Do not shave 48 hours prior to surgery. Men may shave face and neck. Do not bring valuables to the hospital.    [facility] is not responsible for any belongings or valuables.  Contacts, dentures or bridgework may not be worn into  surgery. Leave your suitcase in the car. After surgery it may be brought to your room. For patients admitted to the hospital, discharge time is determined by your treatment team.   Patients discharged the day of surgery will not be allowed to drive home.   Make arrangements for someone to be with you for the first 24 hours of your Same Day Discharge.    Please read over the following fact sheets that you were given:   Total Joint Packet    __X__ Take these medicines the morning of surgery with A SIP OF WATER:    1. pantoprazole (PROTONIX) 40 MG  2. buPROPion (WELLBUTRIN SR)  __X__ Use CHG Soap as directed  __X__ Use Benzoyl Peroxide Gel as  instructed  __X__ Use inhalers on the day of surgery  albuterol (VENTOLIN HFA) 108 (90 Base)  __X__ Stop metformin 2 days prior to surgery (last dose will be Sunday April 18)   __X__ Stop Anti-inflammatories on meloxicam (MOBIC), ibuprofen, Aleve, naproxen, aspirin and or BC powders.   __X__ Stop supplements until after surgery.    __X__ Do not start any herbal supplements before your surgery.

## 2019-07-19 NOTE — Progress Notes (Signed)
   Follow-Up Visit   Subjective  Stacey Gross is a 59 y.o. female who presents for the following: Dermatitis (2 weeks f/u dermatitis, pt is doing a little better ). Pt using Triamcinolone cream with a good response.  Spot on back is better- doesn't itch. Pt c/o irritated, itchy growth on the L forearm, no treatment. Other spot that was treated on her shoulder is better, scab just came off yesterday.   The following portions of the chart were reviewed this encounter and updated as appropriate:     Review of Systems: No other skin or systemic complaints.  Objective  Well appearing patient in no apparent distress; mood and affect are within normal limits.  A focused examination was performed including chest, back, R hip, L hip, L thigh . Relevant physical exam findings are noted in the Assessment and Plan.  Objective  L forearm: Pink patch healing at L anterior shoulder- treated with LN2  2 weeks ago   Pink scaly waxy papule at L forearm- itchy per patient  Objective  Mid Back: Hyperpigmented patch, no erythema or scale- rash is clear  Objective  Right Hip, R thigh, L hip: Coin shaped scaly erythematous papules and patches    Assessment & Plan  Inflamed seborrheic keratosis L forearm  Use Triamcinolone cream as needed for itch for L ant shoulder if residual ISK after healed  Destruction of lesion - L forearm  Destruction method: cryotherapy   Informed consent: discussed and consent obtained   Lesion destroyed using liquid nitrogen: Yes   Region frozen until ice ball extended beyond lesion: Yes   Outcome: patient tolerated procedure well with no complications   Post-procedure details: wound care instructions given    Lichen simplex chronicus Mid Back  LSC is resolved post IL steroid injection last visit   Other Related Medications hydrOXYzine (ATARAX/VISTARIL) 25 MG tablet  Nummular dermatitis Right Hip, R thigh, L hip  Cont Triamcinolone cream to affected  areas bid until itchy rash is clear.  Avoid f/g/a Cont Hydroxyzine 25 mg take as needed prn itch Recommend mild soap and moisturizing cream 1-2 times daily.   Samples of moisturizing cream given today   Topical steroids (such as triamcinolone, fluocinolone, fluocinonide, mometasone, clobetasol, halobetasol, betamethasone, hydrocortisone) can cause thinning and lightening of the skin if they are used for too long in the same area. Your physician has selected the right strength medicine for your problem and area affected on the body. Please use your medication only as directed by your physician to prevent side effects.    triamcinolone cream (KENALOG) 0.1 % - Right Hip, R thigh, L hip  Return in about 2 months (around 09/18/2019) for Dermatitis .   I, Marye Round, CMA, am acting as scribe for Brendolyn Patty, MD .

## 2019-07-22 ENCOUNTER — Other Ambulatory Visit: Payer: Self-pay

## 2019-07-22 ENCOUNTER — Other Ambulatory Visit
Admission: RE | Admit: 2019-07-22 | Discharge: 2019-07-22 | Disposition: A | Discharge status: Home / Self Care | Payer: Medicare Other | Source: Ambulatory Visit | Attending: Orthopedic Surgery | Admitting: Orthopedic Surgery

## 2019-07-22 DIAGNOSIS — Z20822 Contact with and (suspected) exposure to covid-19: Secondary | ICD-10-CM | POA: Diagnosis not present

## 2019-07-22 DIAGNOSIS — M19011 Primary osteoarthritis, right shoulder: Secondary | ICD-10-CM | POA: Diagnosis not present

## 2019-07-22 DIAGNOSIS — K219 Gastro-esophageal reflux disease without esophagitis: Secondary | ICD-10-CM | POA: Diagnosis not present

## 2019-07-22 DIAGNOSIS — E785 Hyperlipidemia, unspecified: Secondary | ICD-10-CM | POA: Diagnosis not present

## 2019-07-22 DIAGNOSIS — J449 Chronic obstructive pulmonary disease, unspecified: Secondary | ICD-10-CM | POA: Diagnosis not present

## 2019-07-22 DIAGNOSIS — E119 Type 2 diabetes mellitus without complications: Secondary | ICD-10-CM | POA: Diagnosis not present

## 2019-07-22 DIAGNOSIS — Z91040 Latex allergy status: Secondary | ICD-10-CM | POA: Diagnosis not present

## 2019-07-22 DIAGNOSIS — Z881 Allergy status to other antibiotic agents status: Secondary | ICD-10-CM | POA: Diagnosis not present

## 2019-07-22 DIAGNOSIS — J45909 Unspecified asthma, uncomplicated: Secondary | ICD-10-CM | POA: Diagnosis not present

## 2019-07-22 DIAGNOSIS — I739 Peripheral vascular disease, unspecified: Secondary | ICD-10-CM | POA: Diagnosis not present

## 2019-07-22 DIAGNOSIS — I10 Essential (primary) hypertension: Secondary | ICD-10-CM | POA: Diagnosis not present

## 2019-07-22 LAB — PROTIME-INR
INR: 1 (ref 0.8–1.2)
Prothrombin Time: 13.1 seconds (ref 11.4–15.2)

## 2019-07-22 LAB — SARS CORONAVIRUS 2 (TAT 6-24 HRS): SARS Coronavirus 2: NEGATIVE

## 2019-07-22 LAB — APTT: aPTT: 32 seconds (ref 24–36)

## 2019-07-22 LAB — SURGICAL PCR SCREEN
MRSA, PCR: NEGATIVE
Staphylococcus aureus: NEGATIVE

## 2019-07-24 ENCOUNTER — Encounter
Admission: RE | Disposition: A | Discharge status: Home / Self Care | Payer: Self-pay | Source: Home / Self Care | Attending: Orthopedic Surgery

## 2019-07-24 ENCOUNTER — Inpatient Hospital Stay
Admission: RE | Admit: 2019-07-24 | Discharge: 2019-07-25 | DRG: 483 | Disposition: A | Discharge status: Home / Self Care | Payer: Medicare Other | Attending: Orthopedic Surgery | Admitting: Orthopedic Surgery

## 2019-07-24 ENCOUNTER — Inpatient Hospital Stay: Payer: Medicare Other | Admitting: Anesthesiology

## 2019-07-24 ENCOUNTER — Encounter: Payer: Self-pay | Admitting: Orthopedic Surgery

## 2019-07-24 ENCOUNTER — Inpatient Hospital Stay: Payer: Medicare Other

## 2019-07-24 ENCOUNTER — Other Ambulatory Visit: Payer: Self-pay

## 2019-07-24 DIAGNOSIS — I10 Essential (primary) hypertension: Secondary | ICD-10-CM | POA: Diagnosis not present

## 2019-07-24 DIAGNOSIS — I739 Peripheral vascular disease, unspecified: Secondary | ICD-10-CM | POA: Diagnosis present

## 2019-07-24 DIAGNOSIS — E119 Type 2 diabetes mellitus without complications: Secondary | ICD-10-CM | POA: Diagnosis present

## 2019-07-24 DIAGNOSIS — E785 Hyperlipidemia, unspecified: Secondary | ICD-10-CM | POA: Diagnosis present

## 2019-07-24 DIAGNOSIS — F17210 Nicotine dependence, cigarettes, uncomplicated: Secondary | ICD-10-CM | POA: Diagnosis present

## 2019-07-24 DIAGNOSIS — Z20822 Contact with and (suspected) exposure to covid-19: Secondary | ICD-10-CM | POA: Diagnosis present

## 2019-07-24 DIAGNOSIS — Z881 Allergy status to other antibiotic agents status: Secondary | ICD-10-CM

## 2019-07-24 DIAGNOSIS — J449 Chronic obstructive pulmonary disease, unspecified: Secondary | ICD-10-CM | POA: Diagnosis not present

## 2019-07-24 DIAGNOSIS — Z96611 Presence of right artificial shoulder joint: Secondary | ICD-10-CM

## 2019-07-24 DIAGNOSIS — Z91040 Latex allergy status: Secondary | ICD-10-CM | POA: Diagnosis not present

## 2019-07-24 DIAGNOSIS — Z419 Encounter for procedure for purposes other than remedying health state, unspecified: Secondary | ICD-10-CM

## 2019-07-24 DIAGNOSIS — G8918 Other acute postprocedural pain: Secondary | ICD-10-CM | POA: Diagnosis not present

## 2019-07-24 DIAGNOSIS — E1151 Type 2 diabetes mellitus with diabetic peripheral angiopathy without gangrene: Secondary | ICD-10-CM | POA: Diagnosis not present

## 2019-07-24 DIAGNOSIS — K219 Gastro-esophageal reflux disease without esophagitis: Secondary | ICD-10-CM | POA: Diagnosis present

## 2019-07-24 DIAGNOSIS — J45909 Unspecified asthma, uncomplicated: Secondary | ICD-10-CM | POA: Diagnosis present

## 2019-07-24 DIAGNOSIS — M19011 Primary osteoarthritis, right shoulder: Secondary | ICD-10-CM | POA: Diagnosis not present

## 2019-07-24 HISTORY — PX: TOTAL SHOULDER ARTHROPLASTY: SHX126

## 2019-07-24 LAB — TYPE AND SCREEN
ABO/RH(D): O POS
Antibody Screen: NEGATIVE

## 2019-07-24 LAB — GLUCOSE, CAPILLARY
Glucose-Capillary: 109 mg/dL — ABNORMAL HIGH (ref 70–99)
Glucose-Capillary: 137 mg/dL — ABNORMAL HIGH (ref 70–99)
Glucose-Capillary: 133 mg/dL — ABNORMAL HIGH (ref 70–99)

## 2019-07-24 LAB — ABO/RH: ABO/RH(D): O POS

## 2019-07-24 SURGERY — ARTHROPLASTY, SHOULDER, TOTAL
Anesthesia: General | Site: Shoulder | Laterality: Right

## 2019-07-24 MED ORDER — PROPOFOL 10 MG/ML IV BOLUS
INTRAVENOUS | Status: AC
  Filled 2019-07-24: qty 20

## 2019-07-24 MED ORDER — HYDROXYZINE HCL 25 MG PO TABS
25.0000 mg | ORAL_TABLET | ORAL | Status: DC | PRN
  Filled 2019-07-24: qty 1

## 2019-07-24 MED ORDER — ALBUTEROL SULFATE (2.5 MG/3ML) 0.083% IN NEBU
2.5000 mg | INHALATION_SOLUTION | Freq: Four times a day (QID) | RESPIRATORY_TRACT | Status: DC | PRN
  Administered 2019-07-25: 2.5 mg via RESPIRATORY_TRACT

## 2019-07-24 MED ORDER — CEFAZOLIN SODIUM-DEXTROSE 2-4 GM/100ML-% IV SOLN
INTRAVENOUS | Status: AC
  Administered 2019-07-24: 2 g via INTRAVENOUS
  Filled 2019-07-24: qty 100

## 2019-07-24 MED ORDER — MIDAZOLAM HCL 2 MG/2ML IJ SOLN: 1.0000 mg | INTRAMUSCULAR | Status: DC | PRN

## 2019-07-24 MED ORDER — ACETAMINOPHEN 500 MG PO TABS
500.0000 mg | ORAL_TABLET | Freq: Four times a day (QID) | ORAL | Status: DC
  Administered 2019-07-25: 500 mg via ORAL

## 2019-07-24 MED ORDER — CEFAZOLIN SODIUM-DEXTROSE 2-4 GM/100ML-% IV SOLN
2.0000 g | INTRAVENOUS | Status: AC
  Administered 2019-07-24: 2 g via INTRAVENOUS

## 2019-07-24 MED ORDER — ROCURONIUM BROMIDE 100 MG/10ML IV SOLN
INTRAVENOUS | Status: DC | PRN
  Administered 2019-07-24: 50 mg via INTRAVENOUS
  Administered 2019-07-24: 20 mg via INTRAVENOUS

## 2019-07-24 MED ORDER — PHENYLEPHRINE HCL-NACL 20-0.9 MG/250ML-% IV SOLN
INTRAVENOUS | Status: DC | PRN
  Administered 2019-07-24: 50 ug/min via INTRAVENOUS

## 2019-07-24 MED ORDER — TRAZODONE HCL 100 MG PO TABS
100.0000 mg | ORAL_TABLET | Freq: Every evening | ORAL | Status: DC | PRN
  Filled 2019-07-24: qty 1

## 2019-07-24 MED ORDER — SODIUM CHLORIDE 0.9 % IR SOLN
Status: DC | PRN
  Administered 2019-07-24: 1000 mL

## 2019-07-24 MED ORDER — DOCUSATE SODIUM 100 MG PO CAPS
100.0000 mg | ORAL_CAPSULE | Freq: Every day | ORAL | Status: DC | PRN
  Filled 2019-07-24: qty 1

## 2019-07-24 MED ORDER — SEMAGLUTIDE(0.25 OR 0.5MG/DOS) 2 MG/1.5ML ~~LOC~~ SOPN: 0.5000 mg | PEN_INJECTOR | SUBCUTANEOUS | Status: DC

## 2019-07-24 MED ORDER — BUPIVACAINE HCL (PF) 0.5 % IJ SOLN
INTRAMUSCULAR | Status: AC
  Filled 2019-07-24: qty 10

## 2019-07-24 MED ORDER — PHENYLEPHRINE HCL (PRESSORS) 10 MG/ML IV SOLN
INTRAVENOUS | Status: DC | PRN
  Administered 2019-07-24 (×2): 200 ug via INTRAVENOUS

## 2019-07-24 MED ORDER — ACETAMINOPHEN 325 MG PO TABS: 325.0000 mg | ORAL_TABLET | Freq: Four times a day (QID) | ORAL | Status: DC | PRN

## 2019-07-24 MED ORDER — BUPIVACAINE LIPOSOME 1.3 % IJ SUSP
INTRAMUSCULAR | Status: DC | PRN
  Administered 2019-07-24: 10 mL

## 2019-07-24 MED ORDER — DOCUSATE SODIUM 100 MG PO CAPS
100.0000 mg | ORAL_CAPSULE | Freq: Two times a day (BID) | ORAL | Status: DC
  Administered 2019-07-24: 100 mg via ORAL
  Filled 2019-07-24 (×2): qty 1

## 2019-07-24 MED ORDER — ONDANSETRON HCL 4 MG PO TABS: 4.0000 mg | ORAL_TABLET | Freq: Four times a day (QID) | ORAL | Status: DC | PRN

## 2019-07-24 MED ORDER — HYDROCORTISONE NA SUCCINATE PF 100 MG IJ SOLR
INTRAMUSCULAR | Status: AC
  Filled 2019-07-24: qty 2

## 2019-07-24 MED ORDER — LOSARTAN POTASSIUM 25 MG PO TABS
25.0000 mg | ORAL_TABLET | Freq: Every day | ORAL | Status: DC
  Filled 2019-07-24: qty 1

## 2019-07-24 MED ORDER — BUPIVACAINE HCL (PF) 0.5 % IJ SOLN
INTRAMUSCULAR | Status: DC | PRN
  Administered 2019-07-24: 20 mL

## 2019-07-24 MED ORDER — TRANEXAMIC ACID-NACL 1000-0.7 MG/100ML-% IV SOLN
1000.0000 mg | INTRAVENOUS | Status: AC
  Administered 2019-07-24: 1000 mg via INTRAVENOUS

## 2019-07-24 MED ORDER — IPRATROPIUM-ALBUTEROL 0.5-2.5 (3) MG/3ML IN SOLN: 3.0000 mL | Freq: Once | RESPIRATORY_TRACT | Status: AC

## 2019-07-24 MED ORDER — SUGAMMADEX SODIUM 200 MG/2ML IV SOLN
INTRAVENOUS | Status: DC | PRN
  Administered 2019-07-24: 200 mg via INTRAVENOUS

## 2019-07-24 MED ORDER — BUPIVACAINE LIPOSOME 1.3 % IJ SUSP
INTRAMUSCULAR | Status: AC
  Filled 2019-07-24: qty 20

## 2019-07-24 MED ORDER — LACTATED RINGERS IV SOLN
INTRAVENOUS | Status: DC
  Administered 2019-07-24: 75 mL/h via INTRAVENOUS

## 2019-07-24 MED ORDER — MORPHINE SULFATE (PF) 4 MG/ML IV SOLN: 0.5000 mg | INTRAVENOUS | Status: DC | PRN

## 2019-07-24 MED ORDER — CEFAZOLIN SODIUM-DEXTROSE 2-4 GM/100ML-% IV SOLN
INTRAVENOUS | Status: AC
  Filled 2019-07-24: qty 100

## 2019-07-24 MED ORDER — FENTANYL CITRATE (PF) 100 MCG/2ML IJ SOLN
50.0000 ug | INTRAMUSCULAR | Status: AC | PRN
  Administered 2019-07-24 (×2): 50 ug via INTRAVENOUS

## 2019-07-24 MED ORDER — FENTANYL CITRATE (PF) 100 MCG/2ML IJ SOLN
INTRAMUSCULAR | Status: AC
  Filled 2019-07-24: qty 2

## 2019-07-24 MED ORDER — UMECLIDINIUM-VILANTEROL 62.5-25 MCG/INH IN AEPB
1.0000 | INHALATION_SPRAY | Freq: Every day | RESPIRATORY_TRACT | Status: DC
  Filled 2019-07-24: qty 14

## 2019-07-24 MED ORDER — ONDANSETRON HCL 4 MG/2ML IJ SOLN: 4.0000 mg | Freq: Once | INTRAMUSCULAR | Status: DC | PRN

## 2019-07-24 MED ORDER — MENTHOL 3 MG MT LOZG
1.0000 | LOZENGE | OROMUCOSAL | Status: DC | PRN
  Filled 2019-07-24: qty 9

## 2019-07-24 MED ORDER — EPHEDRINE SULFATE 50 MG/ML IJ SOLN
INTRAMUSCULAR | Status: DC | PRN
  Administered 2019-07-24: 10 mg via INTRAVENOUS

## 2019-07-24 MED ORDER — PHENOL 1.4 % MT LIQD
1.0000 | OROMUCOSAL | Status: DC | PRN
  Filled 2019-07-24: qty 177

## 2019-07-24 MED ORDER — ALBUTEROL SULFATE HFA 108 (90 BASE) MCG/ACT IN AERS
INHALATION_SPRAY | RESPIRATORY_TRACT | Status: DC | PRN
  Administered 2019-07-24: 2 via RESPIRATORY_TRACT

## 2019-07-24 MED ORDER — METFORMIN HCL 500 MG PO TABS
500.0000 mg | ORAL_TABLET | Freq: Two times a day (BID) | ORAL | Status: DC
  Filled 2019-07-24: qty 1

## 2019-07-24 MED ORDER — ALBUTEROL SULFATE (2.5 MG/3ML) 0.083% IN NEBU: 2.5000 mg | INHALATION_SOLUTION | Freq: Four times a day (QID) | RESPIRATORY_TRACT | Status: DC | PRN

## 2019-07-24 MED ORDER — BACITRACIN 50000 UNITS IM SOLR
INTRAMUSCULAR | Status: AC
  Filled 2019-07-24: qty 2

## 2019-07-24 MED ORDER — ACETAMINOPHEN 500 MG PO TABS
ORAL_TABLET | ORAL | Status: AC
  Administered 2019-07-24: 17:00:00 500 mg via ORAL
  Filled 2019-07-24: qty 1

## 2019-07-24 MED ORDER — BUPIVACAINE-EPINEPHRINE (PF) 0.25% -1:200000 IJ SOLN
INTRAMUSCULAR | Status: DC | PRN
  Administered 2019-07-24: 20 mL

## 2019-07-24 MED ORDER — MIDAZOLAM HCL 2 MG/2ML IJ SOLN
INTRAMUSCULAR | Status: AC
  Administered 2019-07-24: 1 mg via INTRAVENOUS
  Filled 2019-07-24: qty 2

## 2019-07-24 MED ORDER — ACETAMINOPHEN 500 MG PO TABS
ORAL_TABLET | ORAL | Status: AC
  Administered 2019-07-25: 500 mg via ORAL
  Filled 2019-07-24: qty 1

## 2019-07-24 MED ORDER — TRANEXAMIC ACID-NACL 1000-0.7 MG/100ML-% IV SOLN
INTRAVENOUS | Status: AC
  Filled 2019-07-24: qty 100

## 2019-07-24 MED ORDER — CEFAZOLIN SODIUM-DEXTROSE 2-4 GM/100ML-% IV SOLN: 2.0000 g | Freq: Four times a day (QID) | INTRAVENOUS | Status: DC

## 2019-07-24 MED ORDER — KETOROLAC TROMETHAMINE 15 MG/ML IJ SOLN
INTRAMUSCULAR | Status: AC
  Administered 2019-07-25: 15 mg via INTRAVENOUS
  Filled 2019-07-24: qty 1

## 2019-07-24 MED ORDER — FLUTICASONE-UMECLIDIN-VILANT 100-62.5-25 MCG/INH IN AEPB: 1.0000 | INHALATION_SPRAY | Freq: Every day | RESPIRATORY_TRACT | Status: DC

## 2019-07-24 MED ORDER — FENTANYL CITRATE (PF) 100 MCG/2ML IJ SOLN: 25.0000 ug | INTRAMUSCULAR | Status: DC | PRN

## 2019-07-24 MED ORDER — PROPOFOL 10 MG/ML IV BOLUS
INTRAVENOUS | Status: DC | PRN
  Administered 2019-07-24: 120 mg via INTRAVENOUS

## 2019-07-24 MED ORDER — HYDROCORTISONE NA SUCCINATE PF 1000 MG IJ SOLR
INTRAMUSCULAR | Status: DC | PRN
  Administered 2019-07-24: 100 mg via INTRAVENOUS

## 2019-07-24 MED ORDER — METOCLOPRAMIDE HCL 5 MG/ML IJ SOLN: 5.0000 mg | Freq: Three times a day (TID) | INTRAMUSCULAR | Status: DC | PRN

## 2019-07-24 MED ORDER — VITAMIN B-12 1000 MCG PO TABS
1000.0000 ug | ORAL_TABLET | Freq: Every day | ORAL | Status: DC
  Filled 2019-07-24: qty 1

## 2019-07-24 MED ORDER — SODIUM CHLORIDE FLUSH 0.9 % IV SOLN
INTRAVENOUS | Status: AC
  Filled 2019-07-24: qty 20

## 2019-07-24 MED ORDER — HYDROCODONE-ACETAMINOPHEN 5-325 MG PO TABS: 1.0000 | ORAL_TABLET | ORAL | Status: DC | PRN

## 2019-07-24 MED ORDER — ONDANSETRON HCL 4 MG/2ML IJ SOLN
INTRAMUSCULAR | Status: DC | PRN
  Administered 2019-07-24: 4 mg via INTRAVENOUS

## 2019-07-24 MED ORDER — ATORVASTATIN CALCIUM 80 MG PO TABS
80.0000 mg | ORAL_TABLET | Freq: Every day | ORAL | Status: DC
  Administered 2019-07-24: 80 mg via ORAL
  Filled 2019-07-24: qty 1

## 2019-07-24 MED ORDER — LIDOCAINE HCL (CARDIAC) PF 100 MG/5ML IV SOSY
PREFILLED_SYRINGE | INTRAVENOUS | Status: DC | PRN
  Administered 2019-07-24: 100 mg via INTRAVENOUS

## 2019-07-24 MED ORDER — BUPROPION HCL ER (SR) 150 MG PO TB12
150.0000 mg | ORAL_TABLET | Freq: Two times a day (BID) | ORAL | Status: DC
  Administered 2019-07-24: 150 mg via ORAL
  Filled 2019-07-24 (×2): qty 1

## 2019-07-24 MED ORDER — ONDANSETRON HCL 4 MG/2ML IJ SOLN: 4.0000 mg | Freq: Four times a day (QID) | INTRAMUSCULAR | Status: DC | PRN

## 2019-07-24 MED ORDER — LIDOCAINE HCL (PF) 1 % IJ SOLN
INTRAMUSCULAR | Status: AC
  Filled 2019-07-24: qty 5

## 2019-07-24 MED ORDER — EPINEPHRINE PF 1 MG/ML IJ SOLN
INTRAMUSCULAR | Status: AC
  Filled 2019-07-24: qty 1

## 2019-07-24 MED ORDER — IPRATROPIUM-ALBUTEROL 0.5-2.5 (3) MG/3ML IN SOLN
RESPIRATORY_TRACT | Status: AC
  Administered 2019-07-24: 3 mL via RESPIRATORY_TRACT
  Filled 2019-07-24: qty 3

## 2019-07-24 MED ORDER — KETOROLAC TROMETHAMINE 15 MG/ML IJ SOLN
INTRAMUSCULAR | Status: AC
  Administered 2019-07-24: 15 mg via INTRAVENOUS
  Filled 2019-07-24: qty 1

## 2019-07-24 MED ORDER — SODIUM CHLORIDE 0.9 % IV SOLN: INTRAVENOUS | Status: DC

## 2019-07-24 MED ORDER — PANTOPRAZOLE SODIUM 40 MG PO TBEC
40.0000 mg | DELAYED_RELEASE_TABLET | Freq: Every day | ORAL | Status: DC
  Filled 2019-07-24: qty 1

## 2019-07-24 MED ORDER — DEXAMETHASONE SODIUM PHOSPHATE 10 MG/ML IJ SOLN
INTRAMUSCULAR | Status: DC | PRN
  Administered 2019-07-24: 10 mg via INTRAVENOUS

## 2019-07-24 MED ORDER — HYDROCODONE-ACETAMINOPHEN 7.5-325 MG PO TABS
1.0000 | ORAL_TABLET | ORAL | Status: DC | PRN
  Filled 2019-07-24: qty 1

## 2019-07-24 MED ORDER — METOCLOPRAMIDE HCL 10 MG PO TABS: 5.0000 mg | ORAL_TABLET | Freq: Three times a day (TID) | ORAL | Status: DC | PRN

## 2019-07-24 MED ORDER — BUPIVACAINE HCL (PF) 0.25 % IJ SOLN
INTRAMUSCULAR | Status: AC
  Filled 2019-07-24: qty 30

## 2019-07-24 MED ORDER — KETOROLAC TROMETHAMINE 15 MG/ML IJ SOLN: 15.0000 mg | Freq: Four times a day (QID) | INTRAMUSCULAR | Status: DC

## 2019-07-24 SURGICAL SUPPLY — 62 items
BLADE BOVIE TIP EXT 4 (BLADE) ×2 IMPLANT
BLADE SAW 1 (BLADE) ×2 IMPLANT
BLADE SAW 90X25X1.19 OSCILLAT (BLADE) ×2 IMPLANT
BNDG TENSOPLAST 6X5 (GAUZE/BANDAGES/DRESSINGS) ×2 IMPLANT
BODY ANATOMIC PROXIMAL SZ10 (Shoulder) ×2 IMPLANT
BOWL CEMENT MIX W/ADAPTER (MISCELLANEOUS) ×2 IMPLANT
BRUSH SCRUB EZ  4% CHG (MISCELLANEOUS) ×2
BRUSH SCRUB EZ 4% CHG (MISCELLANEOUS) ×2 IMPLANT
CANISTER SUCT 1200ML W/VALVE (MISCELLANEOUS) ×2 IMPLANT
CANISTER SUCT 3000ML PPV (MISCELLANEOUS) ×4 IMPLANT
CEMENT BONE 1-PACK (Cement) ×2 IMPLANT
CHLORAPREP W/TINT 26 (MISCELLANEOUS) ×4 IMPLANT
COVER BACK TABLE REUSABLE LG (DRAPES) ×2 IMPLANT
COVER WAND RF STERILE (DRAPES) ×2 IMPLANT
CRADLE LAMINECT ARM (MISCELLANEOUS) ×4 IMPLANT
DRAPE 3/4 80X56 (DRAPES) ×4 IMPLANT
DRAPE IMP U-DRAPE 54X76 (DRAPES) ×4 IMPLANT
DRAPE INCISE IOBAN 66X60 STRL (DRAPES) ×4 IMPLANT
DRAPE U-SHAPE 47X51 STRL (DRAPES) ×2 IMPLANT
DRSG TEGADERM 6X8 (GAUZE/BANDAGES/DRESSINGS) ×2 IMPLANT
ELECT BLADE 6.5 EXT (BLADE) ×2 IMPLANT
ELECT REM PT RETURN 9FT ADLT (ELECTROSURGICAL) ×2
ELECTRODE REM PT RTRN 9FT ADLT (ELECTROSURGICAL) ×1 IMPLANT
GAUZE SPONGE 4X4 12PLY STRL (GAUZE/BANDAGES/DRESSINGS) ×2 IMPLANT
GAUZE XEROFORM 1X8 LF (GAUZE/BANDAGES/DRESSINGS) ×2 IMPLANT
GLENOID ANCHOR PEG CROSSLK 40 (Orthopedic Implant) ×2 IMPLANT
GLOVE BIOGEL PI ORTHO PRO 7.5 (GLOVE) ×1
GLOVE INDICATOR 8.0 STRL GRN (GLOVE) ×2 IMPLANT
GLOVE PI ORTHO PRO STRL 7.5 (GLOVE) ×1 IMPLANT
GLOVE SURG ORTHO 8.0 STRL STRW (GLOVE) ×2 IMPLANT
GOWN STRL REUS W/ TWL LRG LVL3 (GOWN DISPOSABLE) ×1 IMPLANT
GOWN STRL REUS W/ TWL XL LVL3 (GOWN DISPOSABLE) ×1 IMPLANT
GOWN STRL REUS W/TWL LRG LVL3 (GOWN DISPOSABLE) ×1
GOWN STRL REUS W/TWL XL LVL3 (GOWN DISPOSABLE) ×1
HANDLE YANKAUER SUCT BULB TIP (MISCELLANEOUS) ×2 IMPLANT
HEAD HUM 40X15 GU (Head) ×2 IMPLANT
HOOD PEEL AWAY FLYTE STAYCOOL (MISCELLANEOUS) ×8 IMPLANT
IV NS 1000ML (IV SOLUTION) ×1
IV NS 1000ML BAXH (IV SOLUTION) ×1 IMPLANT
KIT STABILIZATION SHOULDER (MISCELLANEOUS) ×2 IMPLANT
KIT TURNOVER KIT A (KITS) ×2 IMPLANT
MARKER SKIN DUAL TIP RULER LAB (MISCELLANEOUS) ×2 IMPLANT
MASK FACE SPIDER DISP (MASK) ×2 IMPLANT
MAT ABSORB  FLUID 56X50 GRAY (MISCELLANEOUS) ×1
MAT ABSORB FLUID 56X50 GRAY (MISCELLANEOUS) ×1 IMPLANT
NEEDLE REVERSE CUT 1/2 CRC (NEEDLE) ×2 IMPLANT
NS IRRIG 1000ML POUR BTL (IV SOLUTION) ×2 IMPLANT
PACK SHDR ARTHRO (MISCELLANEOUS) ×2 IMPLANT
PULSAVAC PLUS IRRIG FAN TIP (DISPOSABLE) ×2
SLING ARM LRG DEEP (SOFTGOODS) ×2 IMPLANT
SPONGE LAP 18X18 RF (DISPOSABLE) ×2 IMPLANT
STAPLER SKIN PROX 35W (STAPLE) ×2 IMPLANT
STEM STANDARD SZ 10 113MM (Stem) ×2 IMPLANT
STEM STD SZ 10 113MM (Stem) ×1 IMPLANT
STRAP SAFETY 5IN WIDE (MISCELLANEOUS) ×2 IMPLANT
SUT VIC AB 0 CT2 27 (SUTURE) ×2 IMPLANT
SUT VIC AB 2-0 CT1 18 (SUTURE) ×2 IMPLANT
SYR 10ML LL (SYRINGE) ×2 IMPLANT
SYRINGE IRR TOOMEY STRL 70CC (SYRINGE) ×2 IMPLANT
TAPE SUT 30 1/2 CRC GREEN (SUTURE) ×4 IMPLANT
TIP FAN IRRIG PULSAVAC PLUS (DISPOSABLE) ×1 IMPLANT
TUBING CONNECTING 10 (TUBING) ×2 IMPLANT

## 2019-07-24 NOTE — Anesthesia Preprocedure Evaluation (Signed)
Anesthesia Evaluation  Patient identified by MRN, date of birth, ID band Patient awake    Reviewed: Allergy & Precautions, NPO status , Patient's Chart, lab work & pertinent test results  Airway Mallampati: III       Dental   Pulmonary shortness of breath and with exertion, asthma , COPD, Current Smoker,    + rhonchi        Cardiovascular hypertension, + Peripheral Vascular Disease  Normal cardiovascular exam     Neuro/Psych PSYCHIATRIC DISORDERS Anxiety Depression  Neuromuscular disease    GI/Hepatic Neg liver ROS, GERD  ,  Endo/Other  diabetes  Renal/GU negative Renal ROS  negative genitourinary   Musculoskeletal  (+) Arthritis , Osteoarthritis,    Abdominal Normal abdominal exam  (+)   Peds negative pediatric ROS (+)  Hematology negative hematology ROS (+)   Anesthesia Other Findings   Reproductive/Obstetrics                             Anesthesia Physical Anesthesia Plan  ASA: III  Anesthesia Plan: General   Post-op Pain Management:  Regional for Post-op pain   Induction: Intravenous  PONV Risk Score and Plan:   Airway Management Planned: Oral ETT  Additional Equipment:   Intra-op Plan:   Post-operative Plan: Extubation in OR  Informed Consent: I have reviewed the patients History and Physical, chart, labs and discussed the procedure including the risks, benefits and alternatives for the proposed anesthesia with the patient or authorized representative who has indicated his/her understanding and acceptance.     Dental advisory given  Plan Discussed with: CRNA and Surgeon  Anesthesia Plan Comments: (Talked with the patient about performing an interscalene block as requested by the surgeon.  Al risks were discussed including pneumothorax, difficulty breathing, nerve damage, infection and block not working.  In light of patient's pulmonary status, we decided that the  patient should stay in the hospital and that we would decrease the exparel dosage and monitor oxygenation carefully.  The patient agrees to block and wants all potential pain saving measures performed.)        Anesthesia Quick Evaluation

## 2019-07-24 NOTE — Discharge Instructions (Signed)
Interscalene Nerve Block with Exparel  1.  For your surgery you have received an Interscalene Nerve Block with Exparel. 2. Nerve Blocks affect many types of nerves, including nerves that control movement, pain and normal sensation.  You may experience feelings such as numbness, tingling, heaviness, weakness or the inability to move your arm or the feeling or sensation that your arm has "fallen asleep". 3. A nerve block with Exparel can last up to 5 days.  Usually the weakness wears off first.  The tingling and heaviness usually wear off next.  Finally you may start to notice pain.  Keep in mind that this may occur in any order.  Once a nerve block starts to wear off it is usually completely gone within 60 minutes. 4. ISNB may cause mild shortness of breath, a hoarse voice, blurry vision, unequal pupils, or drooping of the face on the same side as the nerve block.  These symptoms will usually resolve with the numbness.  Very rarely the procedure itself can cause mild seizures. 5. If needed, your surgeon will give you a prescription for pain medication.  It will take about 60 minutes for the oral pain medication to become fully effective.  So, it is recommended that you start taking this medication before the nerve block first begins to wear off, or when you first begin to feel discomfort. 6. Take your pain medication only as prescribed.  Pain medication can cause sedation and decrease your breathing if you take more than you need for the level of pain that you have. 7. Nausea is a common side effect of many pain medications.  You may want to eat something before taking your pain medicine to prevent nausea. 8. After an Interscalene nerve block, you cannot feel pain, pressure or extremes in temperature in the effected arm.  Because your arm is numb it is at an increased risk for injury.  To decrease the possibility of injury, please practice the following:  a. While you are awake change the position of  your arm frequently to prevent too much pressure on any one area for prolonged periods of time. b.  If you have a cast or tight dressing, check the color or your fingers every couple of hours.  Call your surgeon with the appearance of any discoloration (white or blue). c. If you are given a sling to wear before you go home, please wear it  at all times until the block has completely worn off.  Do not get up at night without your sling. d. Please contact [facility] or your surgeon if you do not begin to regain sensation after 7 days from the surgery.  Anesthesia may be contacted by calling the Same Day Surgery Department, Mon. through Fri., 6 am to 4 pm at [phone].   e. If you experience any other problems or concerns, please contact your surgeon's office. f. If you experience severe or prolonged shortness of breath go to the nearest emergency department.

## 2019-07-24 NOTE — Anesthesia Procedure Notes (Signed)
Anesthesia Regional Block: Interscalene brachial plexus block   Pre-Anesthetic Checklist: ,, timeout performed, Correct Patient, Correct Site, Correct Laterality, Correct Procedure, Correct Position, site marked, Risks and benefits discussed,  Surgical consent,  Pre-op evaluation,  At surgeon's request and post-op pain management  Laterality: Right  Prep: chloraprep, alcohol swabs       Needles:  Injection technique: Single-shot  Needle Type: Stimiplex     Needle Length: 5cm  Needle Gauge: 22     Additional Needles:   Procedures:, nerve stimulator,,, ultrasound used (permanent image in chart),,,,   Nerve Stimulator or Paresthesia:  Response: biceps flexion, 0.5 mA,   Additional Responses:   Narrative:  Start time: 07/24/2019 12:05 PM End time: 07/24/2019 12:15 PM Injection made incrementally with aspirations every 5 mL.  Performed by: Personally  Anesthesiologist: Alvin Critchley, MD  Additional Notes: Functioning IV was confirmed and monitors were applied.  A 39mm 22ga Stimuplex needle was used. Sterile prep and drape,hand hygiene and sterile gloves were used.  Negative aspiration and negative test dose prior to incremental administration of local anesthetic. The patient tolerated the procedure well.  No pain on injection and good spread noted.  Time out called prior to procedure.

## 2019-07-24 NOTE — Plan of Care (Signed)
Plan of care discussed.   

## 2019-07-24 NOTE — H&P (Signed)
The patient has been re-examined, and the chart reviewed, and there have been no interval changes to the documented history and physical.  Plan a right total shoulder today.  Anesthesia is consulted regarding a peripheral nerve block for post-operative pain.  The risks, benefits, and alternatives have been discussed at length, and the patient is willing to proceed.    

## 2019-07-24 NOTE — Transfer of Care (Signed)
Immediate Anesthesia Transfer of Care Note  Patient: Stacey Gross  Procedure(s) Performed: RIGHT TOTAL SHOULDER ARTHROPLASTY (Right Shoulder)  Patient Location: PACU  Anesthesia Type:General and Regional  Level of Consciousness: awake and alert   Airway & Oxygen Therapy: Patient Spontanous Breathing and Patient connected to nasal cannula oxygen  Post-op Assessment: Report given to RN  Post vital signs: stable  Last Vitals:  Vitals Value Taken Time  BP 138/71 07/24/19 1453  Temp 36.5 C 07/24/19 1453  Pulse 91 07/24/19 1458  Resp 17 07/24/19 1458  SpO2 99 % 07/24/19 1458  Vitals shown include unvalidated device data.  Last Pain:  Vitals:   07/24/19 1453  TempSrc:   PainSc: (P) 0-No pain         Complications: No apparent anesthesia complications

## 2019-07-24 NOTE — Op Note (Signed)
07/24/2019  3:09 PM  Patient:   Stacey Gross  Pre-Op Diagnosis:   Degenerative joint disease, right shoulder.  Post-Op Diagnosis:   Same.  Procedure:   Right total shoulder arthroplasty.  Surgeon:   Kurtis Bushman, MD  Assistant:  Carlynn Spry, PA-C  Anesthesia:   General endotracheal intubation with an interscalene block.  Findings:   As above. The rotator cuff was in satisfactory condition.  Complications:   None  EBL:  50 cc  Drains:   None  Closure:   Staples  Implants:   [employer] system with a standard stem size 10  With a 135 degree porocoat proximal body size 10, a 40 x 15 mm humeral head, and a 40 mm cemented glenoid component.  Brief Clinical Note:   The patient is a 59 y/o female with severe right shoulder pain. The patient presents at this time for a right total shoulder arthroplasty.  Procedure:   The patient was brought into the operating room and lain in the supine position on the OR table. After adequate IV sedation was achieved, an interscalene block was placed by the anesthesiologist. The patient then underwent general endotracheal intubation and anesthesia before being repositioned in the beach chair position using the beach chair positioner. A Foley catheter was placed by the nurse. The right shoulder and upper extremity were prepped with ChloraPrep solution before being draped sterilely. Preoperative antibiotics were administered. A standard anterior approach to the shoulder was made through an approximately 4-5 inch incision. The incision was carried down through the subcutaneous tissues to expose the deltopectoral fascia. The interval between the deltoid and pectoralis muscles was identified and this plane developed, retracting the cephalic vein laterally with the deltoid muscle. The conjoined tendon was identified. The lateral margin was dissected and the Kolbel self-retraining retractor inserted. The "three sisters" were identified and cauterized.  Bursal tissues were removed to improve visualization. The subscapularis tendon was released from its attachment to the lesser tuberosity 1 cm proximal to its insertion and several tagging sutures placed. The inferior capsule was released with care after identifying and protecting the axillary nerve. The proximal humeral cut was made at approximately 30 of retroversion using the extra-medullary guide.   Attention was directed to the glenoid. The labrum was debrided circumferentially before the center of the glenoid was marked with electrocautery. The small and medium sizers were positioned and it was elected to proceed with a small glenoid component. The guidewire was drilled into the glenoid neck using the appropriate guide. After verifying its position, the glenoid was lightly reamed with the butterfly reamer before the centralizing reamer was used. The small peripheral peg guide was positioned and each of the three pegs drilled sequentially, leaving a peg in place so as to minimize shifting of the guide. The bony surfaces were prepared for cementing by irrigating them thoroughly with bacitracin saline solution using the jet lavage system. Meanwhile, cement was mixed on the back table. When it was ready, some cement was injected into each of the three peg holes using a Toomey syringe and additional cement applied to the posterior aspect of the glenoid component. The component was impacted into place and the excess cement was removed. Pressure was maintained on the glenoid until the cement hardened.  Attention was directed to the humeral side. The humeral canal was reamed sequentially beginning with the end-cutting reamer then progressing up to a 40 mm reamer. This provided excellent circumferential chatter. The canal was prepared using a brosteotome.  A trial reduction performed. The arm demonstrated excellent range of motion as the hand could be brought across the chest to the opposite shoulder and brought to  the top of the patient's head and to the patient's ear. The shoulder remained stable throughout this range of motion, and was stable with abduction and external rotation. The joint was dislocated and the trial components removed. The final components were impacted into place with care taken to maintain the appropriate version. Again, the [facility] taper locking mechanism was verified using manual distraction. The shoulder was relocated and again placed through a range of motion with the findings as described above.  The wound was copiously irrigated with bacitracin saline solution using the jet lavage system. The subscapularis tendon was reapproximated using 60mm cottony Dacron sutures. The deltopectoral interval was closed using #0 Vicryl interrupted sutures before the subcutaneous tissues were closed using 2-0 Vicryl interrupted sutures. The skin was closed using staples. A sterile occlusive dressing was applied to the wound before the arm was placed into a sling. The patient was then transferred back to a hospital bed before being awakened, extubated, and returned to the recovery room in satisfactory condition after tolerating the procedure well.

## 2019-07-25 MED ORDER — OXYCODONE HCL 5 MG PO TABS: 5.0000 mg | ORAL_TABLET | ORAL | 0 refills | Status: DC | PRN

## 2019-07-25 MED ORDER — CEFAZOLIN SODIUM-DEXTROSE 2-4 GM/100ML-% IV SOLN
INTRAVENOUS | Status: AC
  Administered 2019-07-25: 2 g via INTRAVENOUS
  Filled 2019-07-25: qty 100

## 2019-07-25 MED ORDER — ASPIRIN EC 81 MG PO TBEC: 81.0000 mg | DELAYED_RELEASE_TABLET | Freq: Two times a day (BID) | ORAL | 2 refills | Status: AC

## 2019-07-25 MED ORDER — ONDANSETRON HCL 4 MG PO TABS: 4.0000 mg | ORAL_TABLET | Freq: Four times a day (QID) | ORAL | 0 refills | Status: DC | PRN

## 2019-07-25 MED ORDER — ALBUTEROL SULFATE (2.5 MG/3ML) 0.083% IN NEBU
INHALATION_SOLUTION | RESPIRATORY_TRACT | Status: AC
  Filled 2019-07-25: qty 3

## 2019-07-25 MED ORDER — ACETAMINOPHEN 500 MG PO TABS
ORAL_TABLET | ORAL | Status: AC
  Filled 2019-07-25: qty 1

## 2019-07-25 MED ORDER — KETOROLAC TROMETHAMINE 15 MG/ML IJ SOLN
INTRAMUSCULAR | Status: AC
  Administered 2019-07-25: 15 mg via INTRAVENOUS
  Filled 2019-07-25: qty 1

## 2019-07-25 NOTE — Progress Notes (Signed)
  Subjective:  Patient reports pain as mild.  Doing well.  Objective:   VITALS:   Vitals:   07/24/19 1759 07/24/19 2000 07/25/19 0000 07/25/19 0447  BP:  117/74 129/72 113/76  Pulse: 82 80 75 77  Resp:  16 18 18   Temp: (!) 97.5 F (36.4 C) 97.9 F (36.6 C) 98.2 F (36.8 C) 98.2 F (36.8 C)  TempSrc:      SpO2: 92% 94% 94% 93%  Weight:      Height:        PHYSICAL EXAM:  Neurologically intact ABD soft Neurovascular intact Sensation intact distally Intact pulses distally Dorsiflexion/Plantar flexion intact Incision: scant drainage No cellulitis present Compartment soft dressing changed  LABS  Results for orders placed or performed during the hospital encounter of 07/24/19 (from the past 24 hour(s))  Glucose, capillary     Status: Abnormal   Collection Time: 07/24/19 11:16 AM  Result Value Ref Range   Glucose-Capillary 109 (H) 70 - 99 mg/dL  Type and screen [place]     Status: None   Collection Time: 07/24/19 11:42 AM  Result Value Ref Range   ABO/RH(D) O POS    Antibody Screen NEG    Sample Expiration      07/27/2019,2359 Performed at [facility], [address] [place], [place] [address]   ABO/Rh     Status: None   Collection Time: 07/24/19 11:51 AM  Result Value Ref Range   ABO/RH(D)      O POS Performed at [facility], [address] [place], [place] [address]   Glucose, capillary     Status: Abnormal   Collection Time: 07/24/19  2:59 PM  Result Value Ref Range   Glucose-Capillary 137 (H) 70 - 99 mg/dL  Glucose, capillary     Status: Abnormal   Collection Time: 07/24/19  3:46 PM  Result Value Ref Range   Glucose-Capillary 133 (H) 70 - 99 mg/dL    [place] OR NERVE BLOCK-IMAGE ONLY [name])  Result Date: 07/24/2019 There is no interpretation for this exam.  This order is for images obtained during a surgical procedure.  Please See "Surgeries" Tab for more information regarding the procedure.     Assessment/Plan: 1 Day Post-Op   Active Problems:   Status post total shoulder arthroplasty, right   Advance diet Up with therapy  Discharge home   Carlynn Spry , PA-C 07/25/2019, 7:31 AM

## 2019-07-25 NOTE — Discharge Summary (Signed)
Physician Discharge Summary  Patient ID: Stacey Gross MRN: [id number] DOB/AGE: 61-Dec-1962 61 y.o.  Admit date: 07/24/2019 Discharge date: 07/25/2019  Admission Diagnoses:  M19.011 PRIMARY OSTEOARTHRITIS, RIGHT SHOULDER <principal problem not specified>  Discharge Diagnoses:  M19.011 PRIMARY OSTEOARTHRITIS, RIGHT SHOULDER Active Problems:   Status post total shoulder arthroplasty, right   Past Medical History:  Diagnosis Date  . Asthma   . Cancer ([place])    stomach  . Chronic back pain   . COPD (chronic obstructive pulmonary disease) ([place])   . Degenerative joint disease (DJD) of hip   . Diabetes mellitus without complication ([place])   . Dyspnea   . GERD (gastroesophageal reflux disease)    takes Omeprazole daily  . History of bronchitis yrs ago  . History of colon polyps    benign  . History of staph infection 10 yrs ago  . Hyperlipidemia    takes Atorvastatin daily  . Hypertension   . Peripheral vascular disease ([place])   . Tobacco use    8 cigarettes daily   . Weakness    numbness and tingling in both legs     Surgeries: Procedure(s): RIGHT TOTAL SHOULDER ARTHROPLASTY on 07/24/2019   Consultants (if any):   Discharged Condition: Improved  Hospital Course: Stacey Gross is an 59 y.o. female who was admitted 07/24/2019 with a diagnosis of  M19.011 PRIMARY OSTEOARTHRITIS, RIGHT SHOULDER <principal problem not specified> and went to the operating room on 07/24/2019 and underwent the above named procedures.    She was given perioperative antibiotics:  Anti-infectives (From admission, onward)   Start     Dose/Rate Route Frequency Ordered Stop   07/24/19 2000  ceFAZolin (ANCEF) IVPB 2g/100 mL premix     2 g 200 mL/hr over 30 Minutes Intravenous Every 6 hours 07/24/19 1527 07/25/19 1359   07/24/19 1429  50,000 units bacitracin in 0.9% normal saline 250 mL irrigation  Status:  Discontinued       As needed 07/24/19 1429 07/24/19 1450   07/24/19 1115  ceFAZolin (ANCEF) IVPB  2g/100 mL premix     2 g 200 mL/hr over 30 Minutes Intravenous On call to O.R. 07/24/19 1110 07/24/19 1253   07/24/19 1112  ceFAZolin (ANCEF) 2-4 GM/100ML-% IVPB    Note to Pharmacy: Milinda Cave   : cabinet override      07/24/19 1112 07/24/19 1255    .  She was given sequential compression devices, early ambulation, and Aspirin for DVT prophylaxis.  She benefited maximally from the hospital stay and there were no complications.    Recent vital signs:  Vitals:   07/25/19 0447 07/25/19 0733  BP: 113/76 130/76  Pulse: 77 76  Resp: 18 17  Temp: 98.2 F (36.8 C) 98 F (36.7 C)  SpO2: 93% 93%    Recent laboratory studies:  Lab Results  Component Value Date   HGB 13.7 06/17/2019   HGB 13.8 02/01/2019   HGB 14.9 12/17/2018   Lab Results  Component Value Date   WBC 8.7 06/17/2019   PLT 277 06/17/2019   Lab Results  Component Value Date   INR 1.0 07/22/2019   Lab Results  Component Value Date   NA 138 06/17/2019   K 4.8 06/17/2019   CL 100 06/17/2019   CO2 23 06/17/2019   BUN 13 06/17/2019   CREATININE 1.06 (H) 06/17/2019   GLUCOSE 87 06/17/2019    Discharge Medications:   Allergies as of 07/25/2019      Reactions   Doxycycline  Hyclate Anaphylaxis, Swelling, Rash   Latex Rash   Vancomycin Rash   All mycins      Medication List    STOP taking these medications   HYDROcodone-acetaminophen 5-325 MG tablet Commonly known as: NORCO/VICODIN     TAKE these medications   albuterol (2.5 MG/3ML) 0.083% nebulizer solution Commonly known as: PROVENTIL Take 2.5 mg by nebulization every 6 (six) hours as needed for wheezing or shortness of breath. What changed: Another medication with the same name was changed. Make sure you understand how and when to take each.   albuterol 108 (90 Base) MCG/ACT inhaler Commonly known as: VENTOLIN HFA INHALE 2 PUFFS BY MOUTH INTO THE LUNGS EVERY 6 HOURS AS NEEDED FOR WHEEZING OR SHORTNESS OF BREATH What changed:   how much to  take  how to take this  when to take this  reasons to take this  additional instructions   aspirin EC 81 MG tablet Take 1 tablet (81 mg total) by mouth 2 (two) times daily.   atorvastatin 80 MG tablet Commonly known as: LIPITOR Take 1 tablet (80 mg total) by mouth daily.   buPROPion 150 MG 12 hr tablet Commonly known as: WELLBUTRIN SR Take 150 mg by mouth 2 (two) times daily.   cilostazol 100 MG tablet Commonly known as: PLETAL Take 1 tablet (100 mg total) by mouth 2 (two) times daily before a meal.   clobetasol cream 0.05 % Commonly known as: TEMOVATE Apply 1 application topically 2 (two) times daily.   cyclobenzaprine 10 MG tablet Commonly known as: FLEXERIL TAKE ONE TABLET BY MOUTH AT BEDTIME What changed:   when to take this  reasons to take this   docusate sodium 100 MG capsule Commonly known as: COLACE Take 100 mg by mouth daily as needed for mild constipation.   glucose blood test strip Use to check blood sugar three times a day.   hydrOXYzine 25 MG tablet Commonly known as: ATARAX/VISTARIL Take 1 tablet (25 mg total) by mouth every 4 (four) hours as needed for itching.   losartan 25 MG tablet Commonly known as: COZAAR Take 1 tablet (25 mg total) by mouth daily. What changed: when to take this   meloxicam 15 MG tablet Commonly known as: MOBIC Take 1 tablet (15 mg total) by mouth daily. What changed: when to take this   metFORMIN 500 MG tablet Commonly known as: GLUCOPHAGE Take 1 tablet (500 mg total) by mouth 2 (two) times daily with a meal.   nystatin cream Commonly known as: MYCOSTATIN Apply 1 application topically 2 (two) times daily.   ondansetron 4 MG tablet Commonly known as: ZOFRAN Take 1 tablet (4 mg total) by mouth every 6 (six) hours as needed for nausea.   ONE TOUCH ULTRA SYSTEM KIT w/Device Kit 1 kit by Does not apply route once. What changed: when to take this   oxyCODONE 5 MG immediate release tablet Commonly known as:  Roxicodone Take 1 tablet (5 mg total) by mouth every 4 (four) hours as needed.   Ozempic (0.25 or 0.5 MG/DOSE) 2 MG/1.5ML Sopn Generic drug: Semaglutide(0.25 or 0.5MG/DOS) Inject 0.5 mg into the skin once a week. What changed: when to take this   pantoprazole 40 MG tablet Commonly known as: PROTONIX Take 1 tablet (40 mg total) by mouth daily. What changed: when to take this   traZODone 100 MG tablet Commonly known as: DESYREL Take 1 tablet (100 mg total) by mouth at bedtime as needed for sleep. What changed: when  to take this   Trelegy Ellipta 100-62.5-25 MCG/INH Aepb Generic drug: Fluticasone-Umeclidin-Vilant Inhale 1 puff into the lungs daily.   triamcinolone cream 0.1 % Commonly known as: KENALOG Apply 1 application topically 2 (two) times daily. Avoid face, groin, underarms.   vitamin B-12 1000 MCG tablet Commonly known as: CYANOCOBALAMIN Take 1 tablet (1,000 mcg total) by mouth daily.       Diagnostic Studies: [place] OR NERVE BLOCK-IMAGE ONLY [facility])  Result Date: 07/24/2019 There is no interpretation for this exam.  This order is for images obtained during a surgical procedure.  Please See "Surgeries" Tab for more information regarding the procedure.    Disposition:   Discharge home      Signed: Carlynn Spry ,PA-C 07/25/2019, 7:42 AM

## 2019-07-25 NOTE — Addendum Note (Signed)
Addendum  created 07/25/19 0805 by Alvin Critchley, MD   Intraprocedure Event edited

## 2019-07-25 NOTE — Anesthesia Postprocedure Evaluation (Signed)
Anesthesia Post Note  Patient: Stacey Gross  Procedure(s) Performed: RIGHT TOTAL SHOULDER ARTHROPLASTY (Right Shoulder)  Patient location during evaluation: PACU Anesthesia Type: General Level of consciousness: awake and alert and oriented Pain management: pain level controlled Vital Signs Assessment: post-procedure vital signs reviewed and stable Respiratory status: spontaneous breathing Cardiovascular status: blood pressure returned to baseline Anesthetic complications: no     Last Vitals:  Vitals:   07/25/19 0447 07/25/19 0733  BP: 113/76 130/76  Pulse: 77 76  Resp: 18 17  Temp: 36.8 C 36.7 C  SpO2: 93% 93%    Last Pain:  Vitals:   07/25/19 0733  TempSrc:   PainSc: 0-No pain                 Aldeen Riga

## 2019-07-25 NOTE — Progress Notes (Signed)
OT Cancellation Note  Patient Details Name: Stacey Gross MRN: [id number] DOB: 08-06-1960   Cancelled Treatment:    Reason Eval/Treat Not Completed: Patient at procedure or test/ unavailable. OT attempted to see pt this am for post-op evaluation/ADL training. Upon arrival to [place] notified this [facility]ia pt had already been [place]. Unable to perform OT evaluation.  Shara Blazing, M.S., OTR/L Ascom: [phone] 07/25/19, 8:54 AM

## 2019-07-31 DIAGNOSIS — M25511 Pain in right shoulder: Secondary | ICD-10-CM | POA: Diagnosis not present

## 2019-07-31 DIAGNOSIS — Z96611 Presence of right artificial shoulder joint: Secondary | ICD-10-CM | POA: Diagnosis not present

## 2019-08-02 DIAGNOSIS — M25511 Pain in right shoulder: Secondary | ICD-10-CM | POA: Diagnosis not present

## 2019-08-02 DIAGNOSIS — Z96611 Presence of right artificial shoulder joint: Secondary | ICD-10-CM | POA: Diagnosis not present

## 2019-08-08 DIAGNOSIS — Z96611 Presence of right artificial shoulder joint: Secondary | ICD-10-CM | POA: Diagnosis not present

## 2019-08-08 DIAGNOSIS — M25511 Pain in right shoulder: Secondary | ICD-10-CM | POA: Diagnosis not present

## 2019-08-12 ENCOUNTER — Ambulatory Visit: Payer: Medicare Other | Admitting: Nurse Practitioner

## 2019-08-14 DIAGNOSIS — R600 Localized edema: Secondary | ICD-10-CM | POA: Diagnosis not present

## 2019-08-14 DIAGNOSIS — M25511 Pain in right shoulder: Secondary | ICD-10-CM | POA: Diagnosis not present

## 2019-08-14 DIAGNOSIS — Z96611 Presence of right artificial shoulder joint: Secondary | ICD-10-CM | POA: Diagnosis not present

## 2019-08-16 DIAGNOSIS — Z96611 Presence of right artificial shoulder joint: Secondary | ICD-10-CM | POA: Diagnosis not present

## 2019-08-16 DIAGNOSIS — M25511 Pain in right shoulder: Secondary | ICD-10-CM | POA: Diagnosis not present

## 2019-08-21 DIAGNOSIS — Z96611 Presence of right artificial shoulder joint: Secondary | ICD-10-CM | POA: Diagnosis not present

## 2019-08-21 DIAGNOSIS — M25511 Pain in right shoulder: Secondary | ICD-10-CM | POA: Diagnosis not present

## 2019-08-23 DIAGNOSIS — M25511 Pain in right shoulder: Secondary | ICD-10-CM | POA: Diagnosis not present

## 2019-08-23 DIAGNOSIS — Z96611 Presence of right artificial shoulder joint: Secondary | ICD-10-CM | POA: Diagnosis not present

## 2019-08-26 ENCOUNTER — Other Ambulatory Visit: Payer: Self-pay

## 2019-08-26 ENCOUNTER — Encounter: Payer: Self-pay | Admitting: Family Medicine

## 2019-08-26 ENCOUNTER — Ambulatory Visit (INDEPENDENT_AMBULATORY_CARE_PROVIDER_SITE_OTHER): Payer: Medicare Other | Admitting: Family Medicine

## 2019-08-26 VITALS — BP 141/81 | HR 77 | Temp 97.9°F | Wt 209.0 lb

## 2019-08-26 DIAGNOSIS — R21 Rash and other nonspecific skin eruption: Secondary | ICD-10-CM

## 2019-08-26 MED ORDER — PREDNISONE 20 MG PO TABS: 40.0000 mg | ORAL_TABLET | Freq: Every day | ORAL | 0 refills | Status: DC

## 2019-08-26 MED ORDER — CLOBETASOL PROPIONATE 0.05 % EX CREA: 1.0000 "application " | TOPICAL_CREAM | Freq: Two times a day (BID) | CUTANEOUS | 0 refills | Status: DC | PRN

## 2019-08-26 NOTE — Progress Notes (Signed)
BP (!) 141/81   Pulse 77   Temp 97.9 F (36.6 C) (Oral)   Wt 209 lb (94.8 kg)   SpO2 94%   BMI 32.73 kg/m    Subjective:    Patient ID: Stacey Gross, female    DOB: [birth year], 59 y.o.   MRN: [id number]  HPI: Stacey Gross is a 59 y.o. female  Chief Complaint  Patient presents with  . Rash    pt states that she was referred to the dermatologist but not working. states she's still itching all over   Patient presenting today for worsening itchy eczema rash, which has been followed by Dermatology but is exacerbated currently. Has been using the triamcinolone cream prescribed but not getting much relief. The rash is in most places of her body at this point. Unable to get an appt in the near future with Dermatology so decided to come here in meantime. Denies new exposures, fever, chills, other new sxs.  Relevant past medical, surgical, family and social history reviewed and updated as indicated. Interim medical history since our last visit reviewed. Allergies and medications reviewed and updated.  Review of Systems  Per HPI unless specifically indicated above     Objective:    BP (!) 141/81   Pulse 77   Temp 97.9 F (36.6 C) (Oral)   Wt 209 lb (94.8 kg)   SpO2 94%   BMI 32.73 kg/m   Wt Readings from Last 3 Encounters:  08/26/19 209 lb (94.8 kg)  07/24/19 212 lb (96.2 kg)  07/19/19 212 lb (96.2 kg)    Physical Exam Vitals and nursing note reviewed.  Constitutional:      Appearance: Normal appearance. She is not ill-appearing.  HENT:     Head: Atraumatic.  Eyes:     Extraocular Movements: Extraocular movements intact.     Conjunctiva/sclera: Conjunctivae normal.  Cardiovascular:     Rate and Rhythm: Normal rate and regular rhythm.     Heart sounds: Normal heart sounds.  Pulmonary:     Effort: Pulmonary effort is normal.     Breath sounds: Normal breath sounds.  Musculoskeletal:        General: Normal range of motion.     Cervical back: Normal range of  motion and neck supple.  Skin:    General: Skin is warm and dry.     Findings: Rash (diffuse patches of dry, erythematous atopic derm rash across body) present.  Neurological:     Mental Status: She is alert and oriented to person, place, and time.  Psychiatric:        Mood and Affect: Mood normal.        Thought Content: Thought content normal.        Judgment: Judgment normal.     Results for orders placed or performed during the hospital encounter of 07/24/19  Glucose, capillary  Result Value Ref Range   Glucose-Capillary 109 (H) 70 - 99 mg/dL  Glucose, capillary  Result Value Ref Range   Glucose-Capillary 137 (H) 70 - 99 mg/dL  Glucose, capillary  Result Value Ref Range   Glucose-Capillary 133 (H) 70 - 99 mg/dL  Type and screen [place]  Result Value Ref Range   ABO/RH(D) O POS    Antibody Screen NEG    Sample Expiration      07/27/2019,2359 Performed at [facility], [address] [place], [place] [address]   ABO/Rh  Result Value Ref Range   ABO/RH(D)  O POS Performed at [facility], [address] [place], [place] [address]       Assessment & Plan:   Problem List Items Addressed This Visit      Musculoskeletal and Integument   Rash - Primary       Follow up plan: Return if symptoms worsen or fail to improve.

## 2019-08-28 DIAGNOSIS — S46911A Strain of unspecified muscle, fascia and tendon at shoulder and upper arm level, right arm, initial encounter: Secondary | ICD-10-CM | POA: Diagnosis not present

## 2019-09-11 DIAGNOSIS — Z96611 Presence of right artificial shoulder joint: Secondary | ICD-10-CM | POA: Diagnosis not present

## 2019-09-11 DIAGNOSIS — M25511 Pain in right shoulder: Secondary | ICD-10-CM | POA: Diagnosis not present

## 2019-09-20 DIAGNOSIS — M25511 Pain in right shoulder: Secondary | ICD-10-CM | POA: Diagnosis not present

## 2019-09-20 DIAGNOSIS — Z96611 Presence of right artificial shoulder joint: Secondary | ICD-10-CM | POA: Diagnosis not present

## 2019-09-23 DIAGNOSIS — Z96611 Presence of right artificial shoulder joint: Secondary | ICD-10-CM | POA: Diagnosis not present

## 2019-09-23 DIAGNOSIS — M25511 Pain in right shoulder: Secondary | ICD-10-CM | POA: Diagnosis not present

## 2019-09-25 ENCOUNTER — Other Ambulatory Visit: Payer: Self-pay | Admitting: Nurse Practitioner

## 2019-10-02 DIAGNOSIS — Z96611 Presence of right artificial shoulder joint: Secondary | ICD-10-CM | POA: Diagnosis not present

## 2019-10-02 DIAGNOSIS — M25511 Pain in right shoulder: Secondary | ICD-10-CM | POA: Diagnosis not present

## 2019-10-04 ENCOUNTER — Other Ambulatory Visit: Payer: Self-pay

## 2019-10-04 ENCOUNTER — Encounter: Payer: Self-pay | Admitting: Nurse Practitioner

## 2019-10-04 ENCOUNTER — Ambulatory Visit (INDEPENDENT_AMBULATORY_CARE_PROVIDER_SITE_OTHER): Payer: Medicare Other | Admitting: Nurse Practitioner

## 2019-10-04 VITALS — BP 128/80 | HR 91 | Temp 97.8°F | Wt 210.0 lb

## 2019-10-04 DIAGNOSIS — J432 Centrilobular emphysema: Secondary | ICD-10-CM

## 2019-10-04 DIAGNOSIS — I10 Essential (primary) hypertension: Secondary | ICD-10-CM

## 2019-10-04 DIAGNOSIS — E669 Obesity, unspecified: Secondary | ICD-10-CM | POA: Insufficient documentation

## 2019-10-04 DIAGNOSIS — L300 Nummular dermatitis: Secondary | ICD-10-CM

## 2019-10-04 DIAGNOSIS — E785 Hyperlipidemia, unspecified: Secondary | ICD-10-CM

## 2019-10-04 DIAGNOSIS — F17219 Nicotine dependence, cigarettes, with unspecified nicotine-induced disorders: Secondary | ICD-10-CM

## 2019-10-04 DIAGNOSIS — I75023 Atheroembolism of bilateral lower extremities: Secondary | ICD-10-CM

## 2019-10-04 DIAGNOSIS — E1151 Type 2 diabetes mellitus with diabetic peripheral angiopathy without gangrene: Secondary | ICD-10-CM | POA: Diagnosis not present

## 2019-10-04 DIAGNOSIS — E1159 Type 2 diabetes mellitus with other circulatory complications: Secondary | ICD-10-CM

## 2019-10-04 DIAGNOSIS — I700 Atherosclerosis of aorta: Secondary | ICD-10-CM

## 2019-10-04 DIAGNOSIS — E1169 Type 2 diabetes mellitus with other specified complication: Secondary | ICD-10-CM

## 2019-10-04 DIAGNOSIS — E6609 Other obesity due to excess calories: Secondary | ICD-10-CM

## 2019-10-04 DIAGNOSIS — J441 Chronic obstructive pulmonary disease with (acute) exacerbation: Secondary | ICD-10-CM | POA: Diagnosis not present

## 2019-10-04 DIAGNOSIS — D692 Other nonthrombocytopenic purpura: Secondary | ICD-10-CM

## 2019-10-04 DIAGNOSIS — Z6832 Body mass index (BMI) 32.0-32.9, adult: Secondary | ICD-10-CM

## 2019-10-04 MED ORDER — PREDNISONE 10 MG PO TABS: ORAL_TABLET | ORAL | 0 refills | Status: DC

## 2019-10-04 MED ORDER — AZITHROMYCIN 250 MG PO TABS: ORAL_TABLET | ORAL | 0 refills | Status: DC

## 2019-10-04 NOTE — Assessment & Plan Note (Signed)
Chronic, ongoing with A1C last visit 6.3%.  Urine ALB 150 and A:C 30-300 last visit.  Obtain A1C today and adjust regimen as needed.  Continue Losartan for kidney protection and check BMP.  Continue current medication regimen + checking BS at home daily.  Monitor diet at home and reduce carbs and foods high in glucose.  Return in 3 months.

## 2019-10-04 NOTE — Assessment & Plan Note (Signed)
Acute, last one in November 2020.  Scripts for Prednisone and Azithromycin sent.  She is aware Prednisone may increase BS for short period and to monitor with her diabetes.   May use OTC diabetic Tussin as needed for cough.  Does not wish to obtain imaging at this time, but will obtain if no improvement or worsening.  Consider pulmonary referral if worsening COPD or frequent exacerbation.

## 2019-10-04 NOTE — Assessment & Plan Note (Signed)
Chronic, ongoing noted on lung CT screening.  Continue statin and ASA daily for prevention.  Continue collaboration with vascular.  Recommend complete cessation smoking. 

## 2019-10-04 NOTE — Assessment & Plan Note (Signed)
I have recommended complete cessation of tobacco use. I have discussed various options available for assistance with tobacco cessation including over the counter methods (Nicotine gum, patch and lozenges). We also discussed prescription options (Chantix, Nicotine Inhaler / Nasal Spray). The patient is not interested in pursuing any prescription tobacco cessation options at this time.  

## 2019-10-04 NOTE — Assessment & Plan Note (Addendum)
Chronic, stable with BP below goal.  Continue Losartan for ongoing kidney protection in diabetes.  Recommend she monitor BP at home regularly and document for provider + focus on DASH diet.  BMP today.  Return in 3 months. 

## 2019-10-04 NOTE — Assessment & Plan Note (Signed)
BMI 32.89 today.  Recommended eating smaller high protein, low fat meals more frequently and exercising 30 mins a day 5 times a week with a goal of 10-15lb weight loss in the next 3 months. Patient voiced their understanding and motivation to adhere to these recommendations.

## 2019-10-04 NOTE — Assessment & Plan Note (Addendum)
Chronic, ongoing.  Continue Trelegy daily and Albuterol as needed.  Recommend complete cessation of smoking.  Continue annual CT scans.  Consider pulmonary referral if worsening.  Spirometry next visit.

## 2019-10-04 NOTE — Progress Notes (Signed)
BP 128/80   Pulse 91   Temp 97.8 F (36.6 C) (Oral)   Wt 210 lb (95.3 kg)   SpO2 96%   BMI 32.89 kg/m    Subjective:    Patient ID: Stacey Gross, female    DOB: 01-18-1961, 59 y.o.   MRN: [id number]  HPI: Stacey Gross is a 59 y.o. female  Chief Complaint  Patient presents with  . Follow-up  . Rash  . Wheezing    pt states [place] advised her to bee seing for wheezing   RASH Ongoing issues, is followed by dermatology and last seen in March 2021.  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 Nicole Kindred.  Seen last in office for rash 08/26/19.  Was given Prednisone for 5 days and Clobetasol.  Dermatology had prescribed Triamcinolone 0.1% cream and Hydroxyzine in March.  Sees dermatology July 21st.   She states rash is getting worse.   Duration:  chronic  Location: generalized  Itching: yes Burning: no Redness: yes Oozing: no Scaling: no Blisters: no Painful: no Fevers: no Change in detergents/soaps/personal care products: no Recent illness: no Recent travel:no History of same: no Context: fluctuating Alleviating factors: Hydroxyzine and Triamcinolone Treatments attempted:as above Shortness of breath: no  Throat/tongue swelling: no Myalgias/arthralgias: no   COPD Continues to smoke 1 PPD.  Went for lung CT screening February 2021.  Current medications include Trelegy and Albuterol.  Reports she has been wheezing a bit more lately, over past weeks. COPD status: exacerbated Satisfied with current treatment?: yes Oxygen use: no Dyspnea frequency: occasional Cough frequency: a little worse -- over the past month Rescue inhaler frequency:  2-3 times a week Limitation of activity: no Productive cough: yes Last Spirometry: unknown Pneumovax: Up to Date Influenza: Up to Date    DIABETES A1C last in March 6.2%. Continues on Metformin 500 MG BID and Ozempic 0.5MG  weekly. Hypoglycemic episodes:no Polydipsia/polyuria:no Visual  disturbance:no Chest pain:no Paresthesias:no Glucose Monitoring:yes Accucheck frequency: Daily Fasting glucose: 117 this morning Post prandial: Evening: Before meals: Taking Insulin?:no Long acting insulin: Short acting insulin: Blood Pressure Monitoring:not checking Retinal Examination:Not up to Date Foot Exam:Up to Date Pneumovax:Up to Date Influenza:Up to Date Aspirin:no  HYPERTENSION / HYPERLIPIDEMIA Continues on Atorvastatin and Losartan.  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 Satisfied with current treatment?yes Duration of hypertension:chronic BP monitoring frequency:not checking BP range:  BP medication side effects:no Duration of hyperlipidemia:chronic Cholesterol medication side effects:no Cholesterol supplements: none Medication compliance:good compliance Aspirin:no Recent stressors:no Recurrent headaches:no Visual changes:no Palpitations:no Dyspnea:no Chest pain:no Lower extremity edema:no Dizzy/lightheaded:no  Relevant past medical, surgical, family and social history reviewed and updated as indicated. Interim medical history since our last visit reviewed. Allergies and medications reviewed and updated.  Review of Systems  Constitutional: Negative for activity change, appetite change, diaphoresis, fatigue and fever.  Respiratory: Positive for cough and wheezing. Negative for chest tightness and shortness of breath.   Cardiovascular: Negative for chest pain, palpitations and leg swelling.  Gastrointestinal: Negative.   Endocrine: Negative for polydipsia, polyphagia and polyuria.  Skin: Positive for rash.  Neurological: Negative.   Psychiatric/Behavioral: Negative.     Per HPI unless specifically indicated above     Objective:    BP 128/80   Pulse 91   Temp 97.8 F (36.6 C)  (Oral)   Wt 210 lb (95.3 kg)   SpO2 96%   BMI 32.89 kg/m   Wt Readings from  Last 3 Encounters:  10/04/19 210 lb (95.3 kg)  08/26/19 209 lb (94.8 kg)  07/24/19 212 lb (96.2 kg)    Physical Exam Vitals and nursing note reviewed.  Constitutional:      General: She is awake.     Appearance: She is well-developed and well-groomed. She is obese.  HENT:     Head: Normocephalic.     Right Ear: Hearing normal.     Left Ear: Hearing normal.  Eyes:     General: Lids are normal.        Right eye: No discharge.        Left eye: No discharge.     Conjunctiva/sclera: Conjunctivae normal.     Pupils: Pupils are equal, round, and reactive to light.  Neck:     Thyroid: No thyromegaly.     Vascular: No carotid bruit.  Cardiovascular:     Rate and Rhythm: Normal rate and regular rhythm.     Heart sounds: Normal heart sounds. No murmur heard.  No gallop.   Pulmonary:     Effort: Pulmonary effort is normal. No accessory muscle usage or respiratory distress.     Breath sounds: Decreased breath sounds and wheezing present.     Comments: Diminished breath sounds noted throughout and expiratory wheezes present.  No SOB with talking and intermittent nonproductive cough present. Abdominal:     General: Bowel sounds are normal.     Palpations: Abdomen is soft.  Musculoskeletal:     Cervical back: Normal range of motion and neck supple.     Right lower leg: No edema.     Left lower leg: No edema.  Skin:    General: Skin is warm and dry.     Findings: Rash present. Rash is scaling.     Comments: Round patches of erythema with exterior scaling noted to right buttock, right upper leg, bilateral forearms, and right lower abdomen.    Scattered pale purple bruising bilateral upper extremities.  Neurological:     Mental Status: She is alert and oriented to person, place, and time.  Psychiatric:        Attention and Perception: Attention normal.        Mood and Affect: Mood normal.        Speech:  Speech normal.        Behavior: Behavior normal. Behavior is cooperative.        Thought Content: Thought content normal.     Results for orders placed or performed during the hospital encounter of 07/24/19  Glucose, capillary  Result Value Ref Range   Glucose-Capillary 109 (H) 70 - 99 mg/dL  Glucose, capillary  Result Value Ref Range   Glucose-Capillary 137 (H) 70 - 99 mg/dL  Glucose, capillary  Result Value Ref Range   Glucose-Capillary 133 (H) 70 - 99 mg/dL  Type and screen [place]  Result Value Ref Range   ABO/RH(D) O POS    Antibody Screen NEG    Sample Expiration      07/27/2019,2359 Performed at [facility], [address], [place], [place] [address]   ABO/Rh  Result Value Ref Range   ABO/RH(D)      O POS Performed at [facility], [address] [place], [place] [address]       Assessment & Plan:   Problem List Items Addressed This Visit      Cardiovascular and Mediastinum   Hypertension associated with diabetes (HCC)    Chronic, stable  with BP below goal.  Continue Losartan for ongoing kidney protection in diabetes.  Recommend she monitor BP at home regularly and document for provider + focus on DASH diet.  BMP today.  Return in 3 months.      Relevant Orders   Basic metabolic panel   HgB P5T   Blue toe syndrome (HCC)    Chronic, ongoing.  Continue collaboration with vascular.      Aortic atherosclerosis (HCC)    Chronic, ongoing noted on lung CT screening.  Continue statin and ASA daily for prevention.  Continue collaboration with vascular.  Recommend complete cessation smoking.      Type 2 diabetes, controlled, with peripheral circulatory disorder (HCC) - Primary    Chronic, ongoing with A1C last visit 6.3%.  Urine ALB 150 and A:C 30-300 last visit.  Obtain A1C today and adjust regimen as needed.  Continue Losartan for kidney protection and check BMP.  Continue current medication regimen + checking BS at  home daily.  Monitor diet at home and reduce carbs and foods high in glucose.  Return in 3 months.      Relevant Orders   HgB A1c   Senile purpura (HCC)    Bilateral upper extremity.  Recommend gentle skin cleansing daily and application of lotion daily.  Monitor skin and notify provider if any abrasions or wounds.        Respiratory   COPD exacerbation ([place])    Acute, last one in November 2020.  Scripts for Prednisone and Azithromycin sent.  She is aware Prednisone may increase BS for short period and to monitor with her diabetes.   May use OTC diabetic Tussin as needed for cough.  Does not wish to obtain imaging at this time, but will obtain if no improvement or worsening.  Consider pulmonary referral if worsening COPD or frequent exacerbation.      Relevant Medications   azithromycin (ZITHROMAX) 250 MG tablet   predniSONE (DELTASONE) 10 MG tablet   Centrilobular emphysema (HCC)    Chronic, ongoing.  Continue Trelegy daily and Albuterol as needed.  Recommend complete cessation of smoking.  Continue annual CT scans.  Consider pulmonary referral if worsening.  Spirometry next visit.      Relevant Medications   azithromycin (ZITHROMAX) 250 MG tablet   predniSONE (DELTASONE) 10 MG tablet     Endocrine   Hyperlipidemia associated with type 2 diabetes mellitus (HCC)    Chronic, ongoing.  Continue current medication regimen and adjust as needed.  Lipid today.      Relevant Orders   Lipid Panel w/o Chol/HDL Ratio   HgB A1c     Nervous and Auditory   Nicotine dependence, cigarettes, w unsp disorders    I have recommended complete cessation of tobacco use. I have discussed various options available for assistance with tobacco cessation including over the counter methods (Nicotine gum, patch and lozenges). We also discussed prescription options (Chantix, Nicotine Inhaler / Nasal Spray). The patient is not interested in pursuing any prescription tobacco cessation options at this time.          Musculoskeletal and Integument   Nummular dermatitis    Ongoing, followed by dermatology but no follow-up until end of month and reports worsening symptoms.  Will send in script for Prednisone which has offered benefit before + recommend she continue daily dermatology regimen to include steroid cream and Hydroxyzine as needed.  Continue oatmeal baths daily.  Avoid scratching areas.  Follow-up with dermatology as scheduled.  Other   Obesity    BMI 32.89 today.  Recommended eating smaller high protein, low fat meals more frequently and exercising 30 mins a day 5 times a week with a goal of 10-15lb weight loss in the next 3 months. Patient voiced their understanding and motivation to adhere to these recommendations.           Follow up plan: Return in about 3 months (around 01/04/2020) for T2DM, HTN/HLD, COPD with spirometry needed, Mood, Skin check.

## 2019-10-04 NOTE — Assessment & Plan Note (Signed)
Bilateral upper extremity.  Recommend gentle skin cleansing daily and application of lotion daily.  Monitor skin and notify provider if any abrasions or wounds. 

## 2019-10-04 NOTE — Patient Instructions (Signed)

## 2019-10-04 NOTE — Assessment & Plan Note (Signed)
Chronic, ongoing. Continue current medication regimen and adjust as needed.  Lipid today.   

## 2019-10-04 NOTE — Assessment & Plan Note (Signed)
Ongoing, followed by dermatology but no follow-up until end of month and reports worsening symptoms.  Will send in script for Prednisone which has offered benefit before + recommend she continue daily dermatology regimen to include steroid cream and Hydroxyzine as needed.  Continue oatmeal baths daily.  Avoid scratching areas.  Follow-up with dermatology as scheduled.

## 2019-10-04 NOTE — Assessment & Plan Note (Signed)
Chronic, ongoing.  Continue collaboration with vascular. 

## 2019-10-05 LAB — BASIC METABOLIC PANEL
BUN/Creatinine Ratio: 12 (ref 12–28)
BUN: 10 mg/dL (ref 8–27)
CO2: 24 mmol/L (ref 20–29)
Calcium: 9.5 mg/dL (ref 8.7–10.3)
Chloride: 103 mmol/L (ref 96–106)
Creatinine, Ser: 0.85 mg/dL (ref 0.57–1.00)
GFR calc Af Amer: 86 mL/min/{1.73_m2} (ref >59)
GFR calc non Af Amer: 74 mL/min/{1.73_m2} (ref >59)
Glucose: 173 mg/dL — ABNORMAL HIGH (ref 65–99)
Potassium: 4.1 mmol/L (ref 3.5–5.2)
Sodium: 141 mmol/L (ref 134–144)

## 2019-10-05 LAB — LIPID PANEL W/O CHOL/HDL RATIO
Cholesterol, Total: 209 mg/dL — ABNORMAL HIGH (ref 100–199)
HDL: 38 mg/dL — ABNORMAL LOW (ref >39)
LDL Chol Calc (NIH): 133 mg/dL — ABNORMAL HIGH (ref 0–99)
Triglycerides: 210 mg/dL — ABNORMAL HIGH (ref 0–149)
VLDL Cholesterol Cal: 38 mg/dL (ref 5–40)

## 2019-10-05 LAB — HEMOGLOBIN A1C
Est. average glucose Bld gHb Est-mCnc: 143 mg/dL
Hgb A1c MFr Bld: 6.6 % — ABNORMAL HIGH (ref 4.8–5.6)

## 2019-10-05 NOTE — Progress Notes (Signed)
Good morning, please let Stacey Gross know her labs have returned: - Kidney function and electrolytes are normal - A1C is 6.6% -- continues current diabetes medications as it remains below goal <7% - Cholesterol levels are still elevated above goal.  Are you taking Atorvastatin every day?  Please let me know.  If not then I recommend taking every day and if you are taking every day then we may need to try a change to Rosuvastatin, a different statin, to see if it offers better control.  If any questions let me know. Have a great day!!

## 2019-10-09 DIAGNOSIS — M25511 Pain in right shoulder: Secondary | ICD-10-CM | POA: Diagnosis not present

## 2019-10-09 DIAGNOSIS — Z96611 Presence of right artificial shoulder joint: Secondary | ICD-10-CM | POA: Diagnosis not present

## 2019-10-10 DIAGNOSIS — K219 Gastro-esophageal reflux disease without esophagitis: Secondary | ICD-10-CM | POA: Diagnosis not present

## 2019-10-10 DIAGNOSIS — J381 Polyp of vocal cord and larynx: Secondary | ICD-10-CM | POA: Diagnosis not present

## 2019-10-15 DIAGNOSIS — Z96612 Presence of left artificial shoulder joint: Secondary | ICD-10-CM | POA: Diagnosis not present

## 2019-10-22 ENCOUNTER — Ambulatory Visit: Payer: Medicare Other | Admitting: Dermatology

## 2019-11-20 ENCOUNTER — Other Ambulatory Visit: Payer: Self-pay | Admitting: Nurse Practitioner

## 2019-11-20 ENCOUNTER — Other Ambulatory Visit: Payer: Self-pay

## 2019-11-20 ENCOUNTER — Ambulatory Visit (INDEPENDENT_AMBULATORY_CARE_PROVIDER_SITE_OTHER): Payer: Medicare Other | Admitting: Dermatology

## 2019-11-20 DIAGNOSIS — L300 Nummular dermatitis: Secondary | ICD-10-CM

## 2019-11-20 DIAGNOSIS — L280 Lichen simplex chronicus: Secondary | ICD-10-CM

## 2019-11-20 MED ORDER — HYDROXYZINE HCL 25 MG PO TABS: ORAL_TABLET | ORAL | 1 refills | Status: DC

## 2019-11-20 MED ORDER — CLOBETASOL PROPIONATE 0.05 % EX SOLN: CUTANEOUS | 1 refills | Status: DC

## 2019-11-20 MED ORDER — CLOBETASOL PROPIONATE 0.05 % EX CREA: TOPICAL_CREAM | CUTANEOUS | 1 refills | Status: DC

## 2019-11-20 NOTE — Progress Notes (Signed)
   Follow-Up Visit   Subjective  Stacey Gross is a 59 y.o. female who presents for the following: Dermatitis (Rash has spread all over now. Before running out, she was using TMC 0.1% Cream and hydroxyzine with no improvement. She is currently using OTC anti-itch creams, which also don't help. Benadryl helps itching some, but comes back. Itching is worse when water hits it in shower.  She uses Dove soap in the shower and Cocoa Butter moisturizer.).  Itching is worse on her back.   The following portions of the chart were reviewed this encounter and updated as appropriate:      Review of Systems:  No other skin or systemic complaints except as noted in HPI or Assessment and Plan.  Objective  Well appearing patient in no apparent distress; mood and affect are within normal limits.  A focused examination was performed including trunk, extremities. Relevant physical exam findings are noted in the Assessment and Plan.  Objective  Right Lower Back: Erythematous plaque with xerosis on mid back; pink scaly patch on right buttock, gluteal cleft; pink scaly plaques on right lateral thigh; xerosis on abdomen.   Assessment & Plan  Nummular dermatitis Right Lower Back  With LSC (back)  Start clobetasol cream Spot treat affected areas BID until itchy rash improved. Avoid face, groin, axilla. Dsp 60g 1rf.  Start clobetasol/CeraVe mix Pt to mix solution in CeraVe Cream and apply to itchy rash BID until improved. Dsp 50mL 1Rf.  Restart hydroxyzine 25mg  take 1-2 po q 4-6 hrs prn itch #180 (3 month supply).  If not improved on follow-up, may biopsy.  Topical steroids (such as triamcinolone, fluocinolone, fluocinonide, mometasone, clobetasol, halobetasol, betamethasone, hydrocortisone) can cause thinning and lightening of the skin if they are used for too long in the same area. Your physician has selected the right strength medicine for your problem and area affected on the body. Please use your  medication only as directed by your physician to prevent side effects.     clobetasol (TEMOVATE) 0.05 % external solution - Right Lower Back  Other Related Medications triamcinolone cream (KENALOG) 0.1 %  Lichen simplex chronicus  Reordered Medications hydrOXYzine (ATARAX/VISTARIL) 25 MG tablet  Return in about 1 month (around 12/21/2019) for Nummular Dermatitis.  IJamesetta Orleans, CMA, am acting as scribe for Brendolyn Patty, MD .  Documentation: I have reviewed the above documentation for accuracy and completeness, and I agree with the above.  Brendolyn Patty MD

## 2019-11-20 NOTE — Patient Instructions (Addendum)
Eczema Skin Care  Buy TWO 16oz jars of CeraVe moisturizing cream  [facility], [facility], [facility] (no prescription needed)  Costs about $15 per jar   Jar #1: Use as a moisturizer as needed. Can be applied to any area of the body. Use twice daily to unaffected areas.  Jar #2: Pour one 50ml bottle of clobetasol 0.05% solution into jar, mix well. Label this jar to indicate the medication has been added. Use twice daily to affected areas. Do not apply to face, groin or underarms.  Moisturizer may burn or sting initially. Try for at least 4 weeks.    Dry Skin Care  What causes dry skin?  Dry skin is common and results from inadequate moisture in the outer skin layers. Dry skin usually results from the excessive loss of moisture from the skin surface. This occurs due to two major factors: 1. Normally the skin's oil glands deposit a layer of oil on the skin's surface. This layer of oil prevents the loss of moisture from the skin. Exposure to soaps, cleaners, solvents, and disinfectants removes this oily film, allowing water to escape. 2. Water loss from the skin increases when the humidity is low. During winter months we spend a lot of time indoors where the air is heated. Heated air has very low humidity. This also contributes to dry skin.  A tendency for dry skin may accompany such disorders as eczema. Also, as people age, the number of functioning oil glands decreases, and the tendency toward dry skin can be a sensation of skin tightness when emerging from the shower.  How do I manage dry skin?  1. Humidify your environment. This can be accomplished by using a humidifier in your bedroom at night during winter months. 2. Bathing can actually put moisture back into your skin if done right. Take the following steps while bathing to sooth dry skin:  Avoid hot water, which only dries the skin and makes itching worse. Use warm water.  Avoid washcloths or extensive rubbing or scrubbing.  Use mild soaps  like unscented Dove, Oil of Olay, Cetaphil, Basis, or CeraVe.  If you take baths rather than showers, rinse off soap residue with clean water before getting out of tub.  Once out of the shower/tub, pat dry gently with a soft towel. Leave your skin damp.  While still damp, apply any medicated ointment/cream you were prescribed to the affected areas. After you apply your medicated ointment/cream, then apply your moisturizer to your whole body.This is the most important step in dry skin care. If this is omitted, your skin will continue to be dry.  The choice of moisturizer is also very important. In general, lotion will not provider enough moisture to severely dry skin because it is water based. You should use an ointment or cream. Moisturizers should also be unscented. Good choices include Vaseline (plain petrolatum), Aquaphor, Cetaphil, CeraVe, Vanicream, DML Forte, Aveeno moisture, or Eucerin Cream.  Bath oils can be helpful, but do not replace the application of moisturizer after the bath. In addition, they make the tub slippery causing an increased risk for falls. Therefore, we do not recommend their use.  

## 2019-11-20 NOTE — Telephone Encounter (Signed)
Requested Prescriptions  Pending Prescriptions Disp Refills  . traZODone (DESYREL) 100 MG tablet [Pharmacy Med Name: TRAZODONE HCL 100 MG TAB] 90 tablet 0    Sig: TAKE 1 TABLET BY MOUTH AT BEDTIME AS NEEDED FOR SLEEP     Psychiatry: Antidepressants - Serotonin Modulator Passed - 11/20/2019 10:39 AM      Passed - Completed PHQ-2 or PHQ-9 in the last 360 days.      Passed - Valid encounter within last 6 months    Recent Outpatient Visits          1 month ago Type 2 diabetes, controlled, with peripheral circulatory disorder [facility])   [place], Henrine Screws T, NP   2 months ago [place], [place], [place]   5 months ago Senile purpura ([place])   [place] Harlan, Jolene T, NP   5 months ago Type 2 diabetes, controlled, with peripheral circulatory disorder ([place])   [place] Cannady, Jolene T, NP   6 months ago Type 2 diabetes, controlled, with peripheral circulatory disorder ([place])   [place], Barbaraann Faster, NP      Future Appointments            In 1 month Cannady, Barbaraann Faster, NP [employer], [facility]

## 2019-12-17 DIAGNOSIS — Z96611 Presence of right artificial shoulder joint: Secondary | ICD-10-CM | POA: Diagnosis not present

## 2019-12-18 ENCOUNTER — Other Ambulatory Visit: Payer: Self-pay

## 2019-12-18 ENCOUNTER — Ambulatory Visit: Payer: Medicare Other | Admitting: Dermatology

## 2019-12-18 DIAGNOSIS — Z872 Personal history of diseases of the skin and subcutaneous tissue: Secondary | ICD-10-CM

## 2019-12-18 DIAGNOSIS — L820 Inflamed seborrheic keratosis: Secondary | ICD-10-CM | POA: Diagnosis not present

## 2019-12-18 DIAGNOSIS — L300 Nummular dermatitis: Secondary | ICD-10-CM | POA: Diagnosis not present

## 2019-12-18 IMAGING — DX CHEST - 2 VIEW
3 series · 3 of 3 positions shown · non-contrast
Comparison: 05/29/2018 CT of the chest

CLINICAL DATA: Productive cough

EXAM:
CHEST - 2 VIEW

[chest pa (1 of 2)]
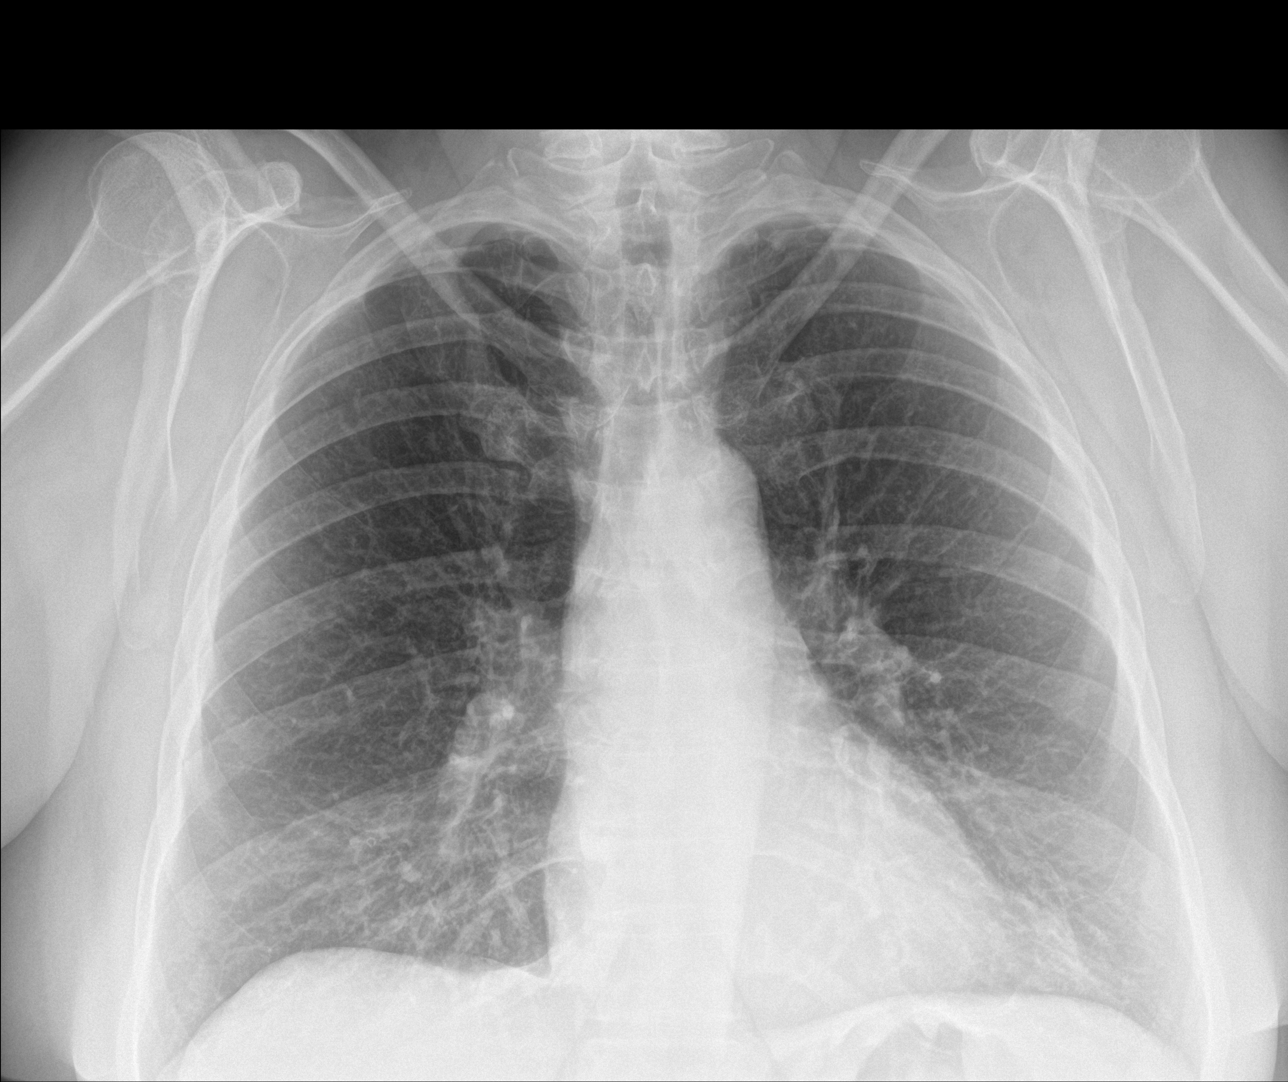

[chest lat]
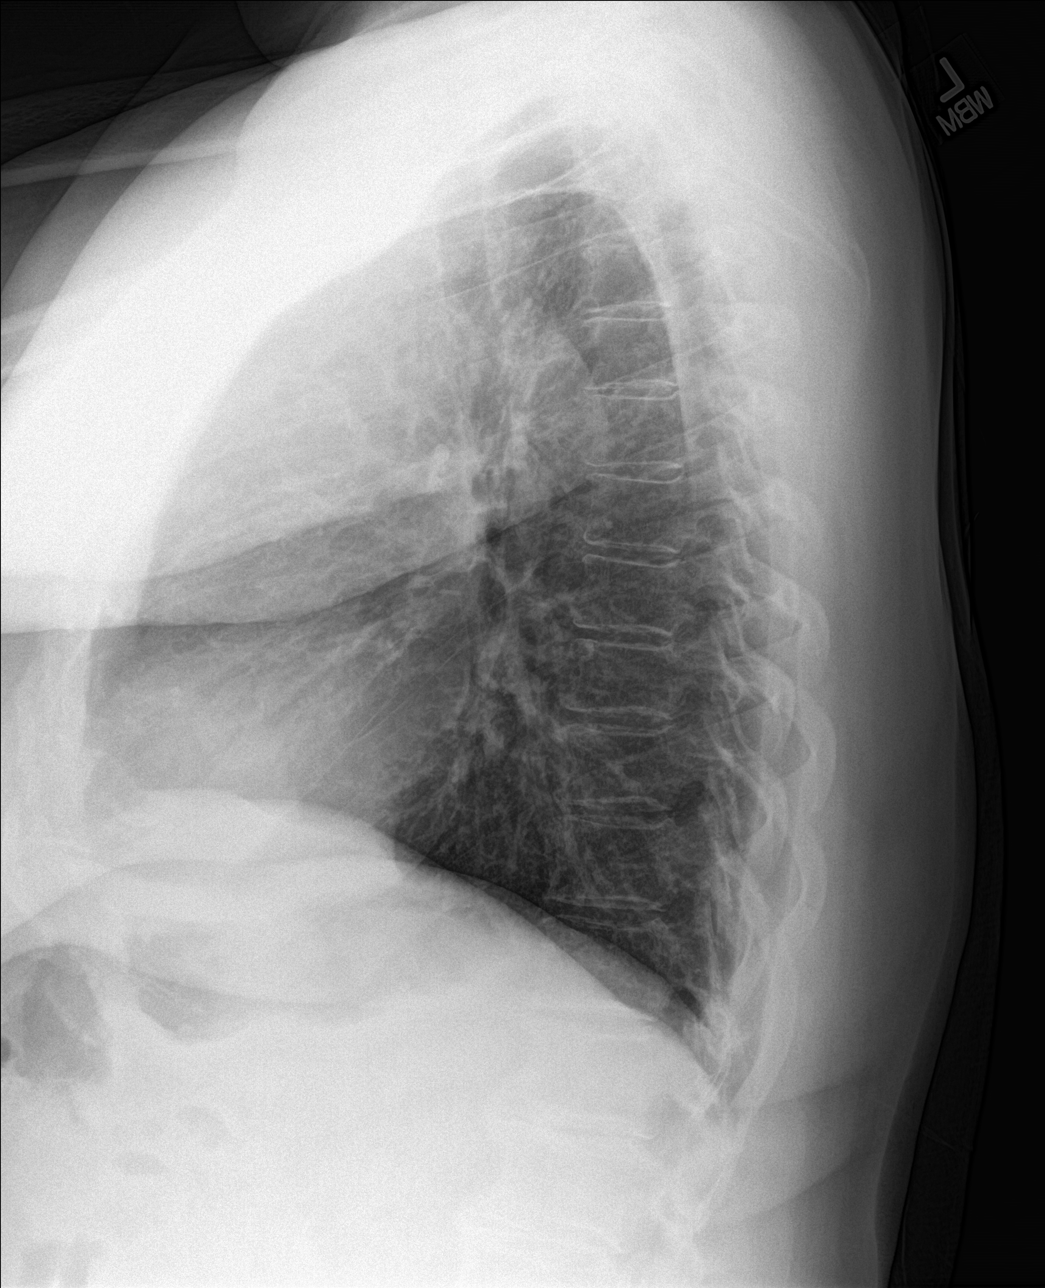

[chest pa (2 of 2)]
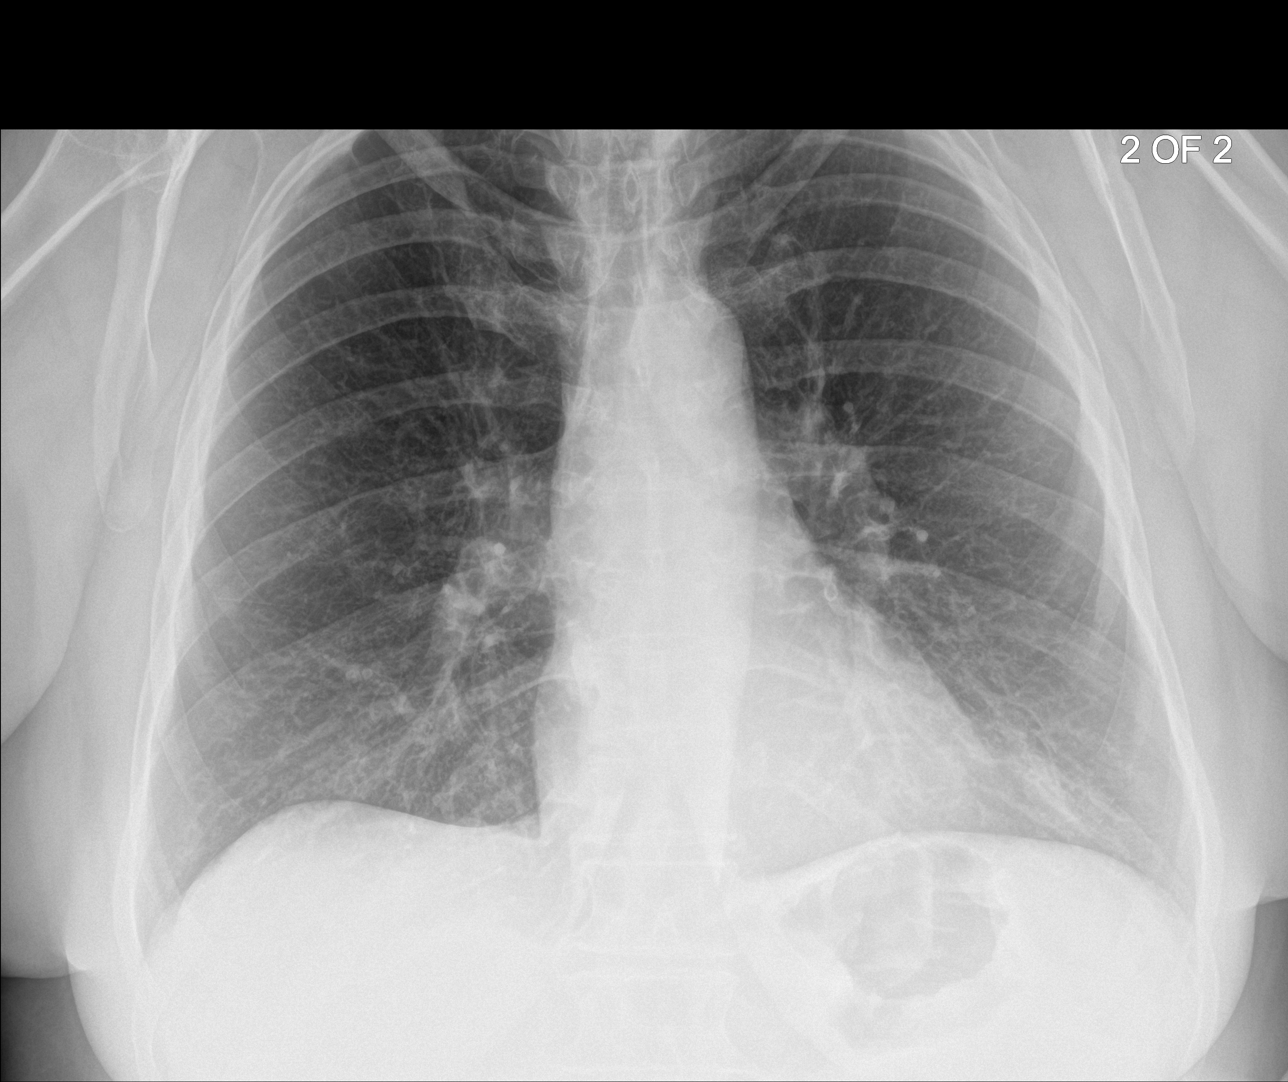

[3 of 3 positions shown; findings below may reference images not displayed]

FINDINGS: Cardiac shadow is within normal limits. The lungs are well aerated
bilaterally. Minimal interstitial changes are noted consistent with
the emphysematous changes seen on prior CT examination. Minimal
atelectatic changes are noted in the left base new from the prior
exam. No other focal abnormality is seen.
IMPRESSION: Minimal left basilar atelectasis. No focal confluent infiltrate is
noted.

## 2019-12-18 NOTE — Patient Instructions (Signed)
Topical steroids (such as triamcinolone, fluocinolone, fluocinonide, mometasone, clobetasol, halobetasol, betamethasone, hydrocortisone) can cause thinning and lightening of the skin if they are used for too long in the same area. Your physician has selected the right strength medicine for your problem and area affected on the body. Please use your medication only as directed by your physician to prevent side effects.   . 

## 2019-12-18 NOTE — Progress Notes (Signed)
   Follow-Up Visit   Subjective  Stacey Gross is a 59 y.o. female who presents for the following: Nummular Dermatitis with [place] (Improving. She is using Clobetasol Cream to spot treat areas and using CeraVe/Clobetasol mix all over.) and ISK (Left anterior shoulder, treated with LN2 in March. Improved some, but still itchy.). She also has an itchy spot on her right forearm.  The rash is no longer itching.   The following portions of the chart were reviewed this encounter and updated as appropriate:      Review of Systems:  No other skin or systemic complaints except as noted in HPI or Assessment and Plan.  Objective  Well appearing patient in no apparent distress; mood and affect are within normal limits.  A focused examination was performed including face, back, shoulder. Relevant physical exam findings are noted in the Assessment and Plan.  Objective  Lower Back: Mild hyperpigmentation of the spinal mid back.  Objective  Left Anterior Shoulder x 1, Right Mid Forearm x 1: Residual waxy papule on left anterior shoulder. Keratotic papule of the right mid forearm.   Assessment & Plan  Nummular dermatitis Lower Back  With Hx of LSC, improved  Continue Clobetasol Cream Spot tx QD/BID to more severe areas. Continue Clobetasol/CeraVe mix QD/BID prn.   Topical steroids (such as triamcinolone, fluocinolone, fluocinonide, mometasone, clobetasol, halobetasol, betamethasone, hydrocortisone) can cause thinning and lightening of the skin if they are used for too long in the same area. Your physician has selected the right strength medicine for your problem and area affected on the body. Please use your medication only as directed by your physician to prevent side effects.     Other Related Medications triamcinolone cream (KENALOG) 0.1 % clobetasol (TEMOVATE) 0.05 % external solution  Inflamed seborrheic keratosis Left Anterior Shoulder x 1, Right Mid Forearm x 1  Destruction of  lesion - Left Anterior Shoulder x 1, Right Mid Forearm x 1  Destruction method: cryotherapy   Informed consent: discussed and consent obtained   Lesion destroyed using liquid nitrogen: Yes   Region frozen until ice ball extended beyond lesion: Yes   Outcome: patient tolerated procedure well with no complications   Post-procedure details: wound care instructions given    Return if symptoms worsen or fail to improve.   IJamesetta Orleans, CMA, am acting as scribe for Brendolyn Patty, MD .  Documentation: I have reviewed the above documentation for accuracy and completeness, and I agree with the above.  Brendolyn Patty MD

## 2019-12-19 ENCOUNTER — Other Ambulatory Visit: Payer: Self-pay | Admitting: Nurse Practitioner

## 2020-01-07 ENCOUNTER — Ambulatory Visit (INDEPENDENT_AMBULATORY_CARE_PROVIDER_SITE_OTHER): Payer: Medicare Other | Admitting: Nurse Practitioner

## 2020-01-07 ENCOUNTER — Encounter: Payer: Self-pay | Admitting: Nurse Practitioner

## 2020-01-07 ENCOUNTER — Other Ambulatory Visit: Payer: Self-pay

## 2020-01-07 VITALS — BP 128/82 | HR 72 | Temp 98.1°F | Ht 67.0 in | Wt 212.0 lb

## 2020-01-07 DIAGNOSIS — J432 Centrilobular emphysema: Secondary | ICD-10-CM

## 2020-01-07 DIAGNOSIS — E1151 Type 2 diabetes mellitus with diabetic peripheral angiopathy without gangrene: Secondary | ICD-10-CM | POA: Diagnosis not present

## 2020-01-07 DIAGNOSIS — K219 Gastro-esophageal reflux disease without esophagitis: Secondary | ICD-10-CM

## 2020-01-07 DIAGNOSIS — E785 Hyperlipidemia, unspecified: Secondary | ICD-10-CM

## 2020-01-07 DIAGNOSIS — F17219 Nicotine dependence, cigarettes, with unspecified nicotine-induced disorders: Secondary | ICD-10-CM

## 2020-01-07 DIAGNOSIS — I152 Hypertension secondary to endocrine disorders: Secondary | ICD-10-CM | POA: Diagnosis not present

## 2020-01-07 DIAGNOSIS — Z23 Encounter for immunization: Secondary | ICD-10-CM | POA: Diagnosis not present

## 2020-01-07 DIAGNOSIS — E1159 Type 2 diabetes mellitus with other circulatory complications: Secondary | ICD-10-CM | POA: Diagnosis not present

## 2020-01-07 DIAGNOSIS — R079 Chest pain, unspecified: Secondary | ICD-10-CM | POA: Diagnosis not present

## 2020-01-07 DIAGNOSIS — E1169 Type 2 diabetes mellitus with other specified complication: Secondary | ICD-10-CM

## 2020-01-07 DIAGNOSIS — E538 Deficiency of other specified B group vitamins: Secondary | ICD-10-CM | POA: Diagnosis not present

## 2020-01-07 DIAGNOSIS — E6609 Other obesity due to excess calories: Secondary | ICD-10-CM

## 2020-01-07 DIAGNOSIS — D692 Other nonthrombocytopenic purpura: Secondary | ICD-10-CM

## 2020-01-07 DIAGNOSIS — Z6833 Body mass index (BMI) 33.0-33.9, adult: Secondary | ICD-10-CM

## 2020-01-07 DIAGNOSIS — I700 Atherosclerosis of aorta: Secondary | ICD-10-CM

## 2020-01-07 LAB — BAYER DCA HB A1C WAIVED: HB A1C (BAYER DCA - WAIVED): 6.6 % (ref <7.0)

## 2020-01-07 MED ORDER — ROSUVASTATIN CALCIUM 20 MG PO TABS: 20.0000 mg | ORAL_TABLET | Freq: Every day | ORAL | 3 refills | Status: DC

## 2020-01-07 MED ORDER — BREZTRI AEROSPHERE 160-9-4.8 MCG/ACT IN AERO: 2.0000 | INHALATION_SPRAY | Freq: Two times a day (BID) | RESPIRATORY_TRACT | 3 refills | Status: DC

## 2020-01-07 NOTE — Assessment & Plan Note (Signed)
Chronic, ongoing noted on lung CT screening.  Continue statin and ASA daily for prevention.  Continue collaboration with vascular.  Recommend complete cessation smoking. 

## 2020-01-07 NOTE — Assessment & Plan Note (Signed)
Ongoing, recommend she take Protonix in morning and cut back on foods/drinks that irritate reflux + cut back on smoking.  Continue collaboration with ENT and GI.

## 2020-01-07 NOTE — Assessment & Plan Note (Signed)
I have recommended complete cessation of tobacco use. I have discussed various options available for assistance with tobacco cessation including over the counter methods (Nicotine gum, patch and lozenges). We also discussed prescription options (Chantix, Nicotine Inhaler / Nasal Spray). The patient is not interested in pursuing any prescription tobacco cessation options at this time.  

## 2020-01-07 NOTE — Assessment & Plan Note (Addendum)
Chronic, ongoing.  Change to Rosuvastatin due to poor tolerance high dose Atorvastatin and adjust as needed.  Lipid today.

## 2020-01-07 NOTE — Assessment & Plan Note (Signed)
Bilateral upper extremity.  Recommend gentle skin cleansing daily and application of lotion daily.  Monitor skin and notify provider if any abrasions or wounds. 

## 2020-01-07 NOTE — Assessment & Plan Note (Signed)
With worsening episodes recently, ?related to cardiac etiology or her ongoing reflux issues.  Continue to collaborate with GI and referral to cardiology placed today due to cardiac risk factors.  EKG in office today reassuring.  Continue current HTN medication regimen and start Rosuvastatin due to poor tolerance with Atorvastatin.  Recommend immediate ER visit if worsening pain or other red flag symptoms present.

## 2020-01-07 NOTE — Assessment & Plan Note (Signed)
Chronic, ongoing.  Continue Albuterol as needed and change Trelegy to [facility], provided sample in office today and she prefers mist.  Recommend complete cessation of smoking.  Continue annual CT scans.  Consider pulmonary referral if worsening.  Spirometry next visit.  Return in 6 weeks.

## 2020-01-07 NOTE — Patient Instructions (Signed)

## 2020-01-07 NOTE — Assessment & Plan Note (Signed)
Chronic, stable with BP below goal.  Continue Losartan for ongoing kidney protection in diabetes.  Recommend she monitor BP at home regularly and document for provider + focus on DASH diet.  BMP today.  Return in 3 months. 

## 2020-01-07 NOTE — Assessment & Plan Note (Signed)
Recommended eating smaller high protein, low fat meals more frequently and exercising 30 mins a day 5 times a week with a goal of 10-15lb weight loss in the next 3 months. Patient voiced their understanding and motivation to adhere to these recommendations.  

## 2020-01-07 NOTE — Progress Notes (Signed)
BP 128/82 (BP Location: Left Arm, Patient Position: Sitting, Cuff Size: Normal)   Pulse 72   Temp 98.1 F (36.7 C) (Oral)   Ht 5\' 7"  (1.702 m)   Wt 212 lb (96.2 kg)   SpO2 96%   BMI 33.20 kg/m    Subjective:    Patient ID: Stacey Gross, female    DOB: Mar 05, 1961, 59 y.o.   MRN: [id number]  HPI: Stacey Gross is a 59 y.o. female  Chief Complaint  Patient presents with  . Diabetes  . Hyperlipidemia   COPD Continues to smoke 1 PPD, not interested in quiting.  Went for lung CT screening February 2021.  Current medications include Trelegy and Albuterol, but is not taking Trelegy due to throwing up with powder.  Has been using Albuterol more frequently since stopping Trelegy.  Educated her on [place] and discussed trial of this, provided sample in office today. COPD status: stable Satisfied with current treatment?: yes Oxygen use: no Dyspnea frequency: occasional Cough frequency: at baseline Rescue inhaler frequency:  every day Limitation of activity: no Productive cough: yes Last Spirometry: unknown Pneumovax: Up to Date Influenza: Up to Date   DIABETES A1C last in July 6.6%. Continues on Metformin 500 MG BID and Ozempic 0.5MG  weekly.  Her B12 level in February 2021 was 303 -- states she is taking daily supplement.  Refuses Covid vaccination. Hypoglycemic episodes:no Polydipsia/polyuria:no Visual disturbance:no Chest pain:no Paresthesias:no Glucose Monitoring:yes Accucheck frequency: twice a week Fasting glucose: <130 in morning, often 111 Post prandial: Evening: Before meals: Taking Insulin?:no Long acting insulin: Short acting insulin: Blood Pressure Monitoring:not checking Retinal Examination:Not up to Date Foot Exam:Up to Date Pneumovax:Up to Date Influenza:Not Up to Date -- getting today Aspirin:no  HYPERTENSION / HYPERLIPIDEMIA Continues on Losartan.   Stopped taking Lipitor one week ago, was on 80 MG but started having joint pain.  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    Reports chest pain x 3 last week -- prior to this gets them every now and then -- left sided.  One episode last week was in store and hurt so bad could not move.  Last about 20 minutes and then dissipate.  During episodes does have chills and diaphoresis.  Denies SOB with episodes, has this at baseline though.  Pain does not radiate elsewhere.  Does have reflux she is followed by GI for, but has ongoing issues with this.  During episodes of recent CP feels like heart is beating really fast. Satisfied with current treatment?yes Duration of hypertension:chronic BP monitoring frequency:not checking BP range:  BP medication side effects:no Duration of hyperlipidemia:chronic Cholesterol medication side effects:no Cholesterol supplements: none Medication compliance:good compliance Aspirin:no Recent stressors:no Recurrent headaches:no Visual changes:no Palpitations:no Dyspnea:no Chest pain:yes Lower extremity edema:no Dizzy/lightheaded:no  Relevant past medical, surgical, family and social history reviewed and updated as indicated. Interim medical history since our last visit reviewed. Allergies and medications reviewed and updated.  Review of Systems  Constitutional: Negative for activity change, appetite change, diaphoresis, fatigue and fever.  Respiratory: Positive for cough and wheezing. Negative for chest tightness and shortness of breath.   Cardiovascular: Negative for chest pain, palpitations and leg swelling.  Gastrointestinal: Negative.   Endocrine: Negative for polydipsia, polyphagia and polyuria.  Neurological: Negative.   Psychiatric/Behavioral: Negative.     Per HPI unless specifically indicated above     Objective:    BP 128/82 (BP Location: Left Arm, Patient Position:  Sitting, Cuff Size: Normal)   Pulse 72   Temp 98.1 F (36.7 C) (Oral)   Ht 5\' 7"  (1.702 m)   Wt 212 lb (96.2 kg)   SpO2 96%   BMI 33.20 kg/m   Wt Readings from Last 3 Encounters:  01/07/20 212 lb (96.2 kg)  10/04/19 210 lb (95.3 kg)  08/26/19 209 lb (94.8 kg)    Physical Exam Vitals and nursing note reviewed.  Constitutional:      General: She is awake.     Appearance: She is well-developed and well-groomed. She is obese.  HENT:     Head: Normocephalic.     Right Ear: Hearing normal.     Left Ear: Hearing normal.  Eyes:     General: Lids are normal.        Right eye: No discharge.        Left eye: No discharge.     Conjunctiva/sclera: Conjunctivae normal.     Pupils: Pupils are equal, round, and reactive to light.  Neck:     Thyroid: No thyromegaly.     Vascular: No carotid bruit.  Cardiovascular:     Rate and Rhythm: Normal rate and regular rhythm.     Heart sounds: Normal heart sounds. No murmur heard.  No gallop.   Pulmonary:     Effort: Pulmonary effort is normal. No accessory muscle usage or respiratory distress.     Breath sounds: Normal breath sounds.  Abdominal:     General: Bowel sounds are normal.     Palpations: Abdomen is soft.  Musculoskeletal:     Cervical back: Normal range of motion and neck supple.     Right lower leg: No edema.     Left lower leg: No edema.  Skin:    General: Skin is warm and dry.     Comments: Scattered pale purple bruising bilateral upper extremities.  Neurological:     Mental Status: She is alert and oriented to person, place, and time.  Psychiatric:        Attention and Perception: Attention normal.        Mood and Affect: Mood normal.        Speech: Speech normal.        Behavior: Behavior normal. Behavior is cooperative.        Thought Content: Thought content normal.    EKG with rate 74, NSR, normal axis noted.    Results for orders placed or performed in visit on 01/07/20  Bayer DCA Hb A1c Waived  Result Value  Ref Range   HB A1C (BAYER DCA - WAIVED) 6.6 <7.0 %      Assessment & Plan:   Problem List Items Addressed This Visit      Cardiovascular and Mediastinum   Hypertension associated with diabetes ([place]) - Primary    Chronic, stable with BP below goal.  Continue Losartan for ongoing kidney protection in diabetes.  Recommend she monitor BP at home regularly and document for provider + focus on DASH diet.  BMP today.  Return in 3 months.      Relevant Medications   rosuvastatin (CRESTOR) 20 MG tablet   Other Relevant Orders   Bayer DCA Hb A1c Waived (Completed)   TSH   Ambulatory referral to Cardiology   Basic metabolic panel   Aortic atherosclerosis (HCC)    Chronic, ongoing noted on lung CT screening.  Continue statin and ASA daily for prevention.  Continue collaboration with vascular.  Recommend complete cessation smoking.  Relevant Medications   rosuvastatin (CRESTOR) 20 MG tablet   Type 2 diabetes, controlled, with peripheral circulatory disorder (HCC)   Relevant Medications   rosuvastatin (CRESTOR) 20 MG tablet   Other Relevant Orders   Bayer DCA Hb A1c Waived (Completed)   Ambulatory referral to Cardiology   Senile purpura ([place])    Bilateral upper extremity.  Recommend gentle skin cleansing daily and application of lotion daily.  Monitor skin and notify provider if any abrasions or wounds.      Relevant Medications   rosuvastatin (CRESTOR) 20 MG tablet     Respiratory   Centrilobular emphysema (HCC)    Chronic, ongoing.  Continue Albuterol as needed and change Trelegy to [facility], provided sample in office today and she prefers mist.  Recommend complete cessation of smoking.  Continue annual CT scans.  Consider pulmonary referral if worsening.  Spirometry next visit.  Return in 6 weeks.      Relevant Medications   Budeson-Glycopyrrol-Formoterol (BREZTRI AEROSPHERE) 160-9-4.8 MCG/ACT AERO     Digestive   GERD (gastroesophageal reflux disease)    Ongoing, recommend  she take Protonix in morning and cut back on foods/drinks that irritate reflux + cut back on smoking.  Continue collaboration with ENT and GI.        Endocrine   Hyperlipidemia associated with type 2 diabetes mellitus (HCC)    Chronic, ongoing.  Change to Rosuvastatin due to poor tolerance high dose Atorvastatin and adjust as needed.  Lipid today.      Relevant Medications   rosuvastatin (CRESTOR) 20 MG tablet   Other Relevant Orders   Bayer DCA Hb A1c Waived (Completed)   Lipid Panel w/o Chol/HDL Ratio   Ambulatory referral to Cardiology     Nervous and Auditory   Nicotine dependence, cigarettes, w unsp disorders    I have recommended complete cessation of tobacco use. I have discussed various options available for assistance with tobacco cessation including over the counter methods (Nicotine gum, patch and lozenges). We also discussed prescription options (Chantix, Nicotine Inhaler / Nasal Spray). The patient is not interested in pursuing any prescription tobacco cessation options at this time.         Other   Obesity    Recommended eating smaller high protein, low fat meals more frequently and exercising 30 mins a day 5 times a week with a goal of 10-15lb weight loss in the next 3 months. Patient voiced their understanding and motivation to adhere to these recommendations.       B12 deficiency    Chronic, continue supplement and recheck level today.  Is on long term Metformin.      Relevant Orders   Vitamin B12   Chest pain    With worsening episodes recently, ?related to cardiac etiology or her ongoing reflux issues.  Continue to collaborate with GI and referral to cardiology placed today due to cardiac risk factors.  EKG in office today reassuring.  Continue current HTN medication regimen and start Rosuvastatin due to poor tolerance with Atorvastatin.  Recommend immediate ER visit if worsening pain or other red flag symptoms present.      Relevant Orders   EKG 12-Lead  (Completed)   Ambulatory referral to Cardiology    Other Visit Diagnoses    Need for influenza vaccination       Relevant Orders   Flu Vaccine QUAD 36+ mos IM (Completed)      Time: 30 minutes, >50% spent counseling/or care coordination   Follow up plan:  Return in about 4 weeks (around 02/04/2020) for chest pain and COPD, Depression -- need spirometry.

## 2020-01-07 NOTE — Assessment & Plan Note (Signed)
Chronic, continue supplement and recheck level today.  Is on long term Metformin. 

## 2020-01-08 LAB — LIPID PANEL W/O CHOL/HDL RATIO
Cholesterol, Total: 183 mg/dL (ref 100–199)
HDL: 43 mg/dL (ref >39)
LDL Chol Calc (NIH): 108 mg/dL — ABNORMAL HIGH (ref 0–99)
Triglycerides: 186 mg/dL — ABNORMAL HIGH (ref 0–149)
VLDL Cholesterol Cal: 32 mg/dL (ref 5–40)

## 2020-01-08 LAB — TSH: TSH: 0.645 u[IU]/mL (ref 0.450–4.500)

## 2020-01-08 LAB — VITAMIN B12: Vitamin B-12: 342 pg/mL (ref 232–1245)

## 2020-01-08 NOTE — Progress Notes (Signed)
Contacted via [place] afternoon Stacey Gross your labs have returned.  Thyroid is normal.  B12 level remains on low side, please take Vitamin B12 1000 MCG daily, you can obtain this in vitamin section at all locations.  This helps with nervous system health.  Cholesterol levels show LDL remaining above goal, please ensure you are taking your Rosuvastatin as ordered.  If continue to be above goal next visit we may need to increase dose.  Any questions? Keep being awesome!!  Thank you for allowing me to participate in your care. Kindest regards, Noelle Hoogland

## 2020-01-09 ENCOUNTER — Ambulatory Visit: Payer: Medicare Other | Admitting: Cardiology

## 2020-01-09 ENCOUNTER — Encounter: Payer: Self-pay | Admitting: Cardiology

## 2020-01-09 ENCOUNTER — Other Ambulatory Visit: Payer: Self-pay

## 2020-01-09 VITALS — BP 120/80 | HR 85 | Ht 67.0 in | Wt 211.4 lb

## 2020-01-09 DIAGNOSIS — I10 Essential (primary) hypertension: Secondary | ICD-10-CM

## 2020-01-09 DIAGNOSIS — R072 Precordial pain: Secondary | ICD-10-CM

## 2020-01-09 DIAGNOSIS — Z01812 Encounter for preprocedural laboratory examination: Secondary | ICD-10-CM

## 2020-01-09 DIAGNOSIS — R079 Chest pain, unspecified: Secondary | ICD-10-CM | POA: Diagnosis not present

## 2020-01-09 DIAGNOSIS — E7800 Pure hypercholesterolemia, unspecified: Secondary | ICD-10-CM

## 2020-01-09 DIAGNOSIS — F17200 Nicotine dependence, unspecified, uncomplicated: Secondary | ICD-10-CM | POA: Diagnosis not present

## 2020-01-09 MED ORDER — METOPROLOL TARTRATE 100 MG PO TABS: 100.0000 mg | ORAL_TABLET | Freq: Once | ORAL | 0 refills | Status: DC

## 2020-01-09 NOTE — Patient Instructions (Signed)
Medication Instructions:   See CTA day of instructions.  *If you need a refill on your cardiac medications before your next appointment, please call your pharmacy*   Lab Work: Your physician recommends that you return for lab work in:  After you get scheduled for your CTA.  If you have labs (blood work) drawn today and your tests are completely normal, you will receive your results only by: Marland Kitchen MyChart Message (if you have MyChart) OR . A paper copy in the mail If you have any lab test that is abnormal or we need to change your treatment, we will call you to review the results.   Testing/Procedures:  1)  Your physician has requested that you have an echocardiogram. Echocardiography is a painless test that uses sound waves to create images of your heart. It provides your doctor with information about the size and shape of your heart and how well your heart's chambers and valves are working. This procedure takes approximately one hour. There are no restrictions for this procedure.   2)  Your physician has requested that you have cardiac CT. Cardiac computed tomography (CT) is a painless test that uses an x-ray machine to take clear, detailed pictures of your heart.    Your cardiac CT will be scheduled at:  [facility] [address] [place], [place] [address] [phone]   Please arrive 15 mins early for check-in and test prep.   Please follow these instructions carefully (unless otherwise directed):   On the Night Before the Test: . Be sure to Drink plenty of water. . Do not consume any caffeinated/decaffeinated beverages or chocolate 12 hours prior to your test. . Do not take any antihistamines 12 hours prior to your test.   On the Day of the Test: . Drink plenty of water. Do not drink any water within one hour of the test. . Do not eat any food 4 hours prior to the test. . You may take your regular medications prior to the  test.  . Take metoprolol (Lopressor) two hours prior to test. . HOLD losartan (COZAAR) 25 MG tablet . FEMALES- please wear underwire-free bra if available        After the Test: . Drink plenty of water. . After receiving IV contrast, you may experience a mild flushed feeling. This is normal. . On occasion, you may experience a mild rash up to 24 hours after the test. This is not dangerous. If this occurs, you can take Benadryl 25 mg and increase your fluid intake. . If you experience trouble breathing, this can be serious. If it is severe call 911 IMMEDIATELY. If it is mild, please call our office. . If you take any of these medications: Glipizide/Metformin, Avandament, Glucavance, please do not take 48 hours after completing test unless otherwise instructed.   Once we have confirmed authorization from your insurance company, we will call you to set up a date and time for your test. Based on how quickly your insurance processes prior authorizations requests, please allow up to 4 weeks to be contacted for scheduling your Cardiac CT appointment. Be advised that routine Cardiac CT appointments could be scheduled as many as 8 weeks after your provider has ordered it.  For non-scheduling related questions, please contact the cardiac imaging nurse navigator should you have any questions/concerns: Marchia Bond, Cardiac Imaging Nurse Navigator Burley Saver, Interim Cardiac Imaging Nurse [place] [facility] Direct Office Dial: [phone]  For scheduling needs, including cancellations and rescheduling, please call Vivien Rota at [phone], option 3.       Follow-Up: At [facility], you and your health needs are our priority.  As part of our continuing mission to provide you with exceptional heart care, we have created designated Provider Care Teams.  These Care Teams include your primary Cardiologist (physician) and Advanced Practice Providers (APPs -  Physician  Assistants and Nurse Practitioners) who all work together to provide you with the care you need, when you need it.  We recommend signing up for the patient portal called "MyChart".  Sign up information is provided on this After Visit Summary.  MyChart is used to connect with patients for Virtual Visits (Telemedicine).  Patients are able to view lab/test results, encounter notes, upcoming appointments, etc.  Non-urgent messages can be sent to your provider as well.   To learn more about what you can do with MyChart, go to [email or web address].    Your next appointment:   Follow up after Echo and Myoview   The format for your next appointment:   In Person  Provider:   Kate Sable, MD   Other Instructions

## 2020-01-09 NOTE — Progress Notes (Signed)
Cardiology Office Note:    Date:  01/09/2020   ID:  Stacey Gross, DOB July 13, 1960, MRN [id number]  PCP:  Venita Lick, NP  [facility] Cardiologist:  Kate Sable, MD  [place] Electrophysiologist:  None   Referring MD: Venita Lick, NP   Chief Complaint  Patient presents with   New Patient (Initial Visit)    Ref by Marnee Guarneri, NP for chest pain. Meds reviewed by the pt. verbally. Pt. c/o chest pain that comes and goes with occas. pain that radiates through to her back as well as feeling nauseated with symptoms of chest pain.     History of Present Illness:    Stacey Gross is a 59 y.o. female with a hx of hypertension, diabetes, hyperlipidemia, COPD, current smoker x40+ years who presents due to chest pain.  Patient states having on and off symptoms of chest pain for months now.  She has previously attributed the symptoms to reflux.  She had 3 episodes last week, which she describes as chest pressure 10 out of 10 in severity, lasting 20 minutes, while walking at a grocery store.  The other 2 episodes occurred when she was sitting.  She denies any history of heart disease.  Does not know much about her family history because she was adopted.  States having chronic shortness of breath with exertion due to COPD.  She currently smokes.  Past Medical History:  Diagnosis Date   Asthma    Cancer ([place])    stomach   Chronic back pain    COPD (chronic obstructive pulmonary disease) (HCC)    Degenerative joint disease (DJD) of hip    Diabetes mellitus without complication (HCC)    Dyspnea    GERD (gastroesophageal reflux disease)    takes Omeprazole daily   History of bronchitis yrs ago   History of colon polyps    benign   History of staph infection 10 yrs ago   Hyperlipidemia    takes Atorvastatin daily   Hypertension    Peripheral vascular disease (HCC)    Tobacco use    8 cigarettes daily    Weakness    numbness and tingling in  both legs     Past Surgical History:  Procedure Laterality Date   ABDOMINAL HYSTERECTOMY     total   APPENDECTOMY     BACK SURGERY  9-10 yrs ago   BREAST BIOPSY Right    Core- neg   COLONOSCOPY     COLONOSCOPY WITH PROPOFOL N/A 09/20/2017   Procedure: COLONOSCOPY WITH PROPOFOL;  Surgeon: Virgel Manifold, MD;  Location: [facility] ENDOSCOPY;  Service: Endoscopy;  Laterality: N/A;   COLONOSCOPY WITH PROPOFOL N/A 04/24/2018   Procedure: COLONOSCOPY WITH PROPOFOL;  Surgeon: Lucilla Lame, MD;  Location[facility]COPY;  Service: Endoscopy;  Laterality: N/A;   ESOPHAGOGASTRODUODENOSCOPY (EGD) WITH PROPOFOL N/A 09/20/2017   Procedure: ESOPHAGOGASTRODUODENOSCOPY (EGD) WITH PROPOFOL;  Surgeon: Virgel Manifold, MD;  Location: [facility] ENDOSCOPY;  Service: Endoscopy;  Laterality: N/A;   ESOPHAGOGASTRODUODENOSCOPY (EGD) WITH PROPOFOL N/A 11/20/2018   Procedure: ESOPHAGOGASTRODUODENOSCOPY (EGD) WITH PROPOFOL;  Surgeon: Lucilla Lame, MD;  Location: [facility] ENDOSCOPY;  Service: Endoscopy;  Laterality: N/A;   PERIPHERAL VASCULAR CATHETERIZATION Left 03/17/2016   Procedure: Lower Extremity Angiography;  Surgeon: Algernon Huxley, MD;  Location: [place] CV LAB;  Service: Cardiovascular;  Laterality: Left;   TOTAL SHOULDER ARTHROPLASTY Right 07/24/2019   Procedure: RIGHT TOTAL SHOULDER ARTHROPLASTY;  Surgeon: Lovell Sheehan, MD;  Location: [facility] ORS;  Service: Orthopedics;  Laterality: Right;    Current Medications: Current Meds  Medication Sig   albuterol (PROVENTIL) (2.5 MG/3ML) 0.083% nebulizer solution USE 1 VIAL (3 ML = 2.5 MG TOTAL) BY NEBULIZATION EVERY 6 HOURS AS NEEDED FORWHEEZING OR SHORTNESS OF BREATH   albuterol (VENTOLIN HFA) 108 (90 Base) MCG/ACT inhaler INHALE 2 PUFFS BY MOUTH INTO THE LUNGS EVERY 6 HOURS AS NEEDED FOR WHEEZING OR SHORTNESS OF BREATH (Patient taking differently: Inhale 2 puffs into the lungs every 6 (six) hours as needed for wheezing or shortness of breath. )    aspirin EC 81 MG tablet Take 1 tablet (81 mg total) by mouth 2 (two) times daily. (Patient taking differently: Take 81 mg by mouth daily. )   Blood Glucose Monitoring Suppl (ONE TOUCH ULTRA SYSTEM KIT) W/DEVICE KIT 1 kit by Does not apply route once. (Patient taking differently: 1 kit by Does not apply route daily. )   Budeson-Glycopyrrol-Formoterol (BREZTRI AEROSPHERE) 160-9-4.8 MCG/ACT AERO Inhale 2 puffs into the lungs 2 (two) times daily.   buPROPion (WELLBUTRIN SR) 150 MG 12 hr tablet Take 150 mg by mouth 2 (two) times daily.   cilostazol (PLETAL) 100 MG tablet Take 1 tablet (100 mg total) by mouth 2 (two) times daily before a meal.   clobetasol (TEMOVATE) 0.05 % external solution Patient to mix solution in 1 jar of CeraVe Cream. Apply to itchy rash twice daily until improved.   cyclobenzaprine (FLEXERIL) 10 MG tablet TAKE ONE TABLET BY MOUTH AT BEDTIME (Patient taking differently: Take 10 mg by mouth at bedtime as needed for muscle spasms. )   docusate sodium (COLACE) 100 MG capsule Take 100 mg by mouth daily as needed for mild constipation.   glucose blood test strip Use to check blood sugar three times a day.   hydrOXYzine (ATARAX/VISTARIL) 25 MG tablet Take 1-2 tablets by mouth every 4-6 hours as needed for itch. May make drowsy.   losartan (COZAAR) 25 MG tablet Take 1 tablet (25 mg total) by mouth daily. (Patient taking differently: Take 25 mg by mouth every evening. )   meloxicam (MOBIC) 15 MG tablet Take 1 tablet (15 mg total) by mouth daily. (Patient taking differently: Take 15 mg by mouth at bedtime. )   metFORMIN (GLUCOPHAGE) 500 MG tablet TAKE 1 TABLET BY MOUTH TWICE A DAY WITH A MEAL   ondansetron (ZOFRAN) 4 MG tablet Take 1 tablet (4 mg total) by mouth every 6 (six) hours as needed for nausea.   oxyCODONE (ROXICODONE) 5 MG immediate release tablet Take 1 tablet (5 mg total) by mouth every 4 (four) hours as needed.   pantoprazole (PROTONIX) 40 MG tablet Take 1 tablet  (40 mg total) by mouth daily. (Patient taking differently: Take 40 mg by mouth every evening. )   rosuvastatin (CRESTOR) 20 MG tablet Take 1 tablet (20 mg total) by mouth daily.   Semaglutide,0.25 or 0.5MG/DOS, (OZEMPIC, 0.25 OR 0.5 MG/DOSE,) 2 MG/1.5ML SOPN Inject 0.5 mg into the skin once a week. (Patient taking differently: Inject 0.5 mg into the skin every Thursday. )   traZODone (DESYREL) 100 MG tablet TAKE 1 TABLET BY MOUTH AT BEDTIME AS NEEDED FOR SLEEP   triamcinolone cream (KENALOG) 0.1 % Apply 1 application topically 2 (two) times daily. Avoid face, groin, underarms.   vitamin B-12 (CYANOCOBALAMIN) 1000 MCG tablet Take 1 tablet (1,000 mcg total) by mouth daily.     Allergies:   Doxycycline hyclate, Acetaminophen, Aspirin, Latex, and Vancomycin   Social History  Socioeconomic History   Marital status: Widowed    Spouse name: Not on file   Number of children: 0   Years of education: 8   Highest education level: Not on file  Occupational History   Occupation: disabled  Tobacco Use   Smoking status: Current Every Day Smoker    Packs/day: 1.00    Years: 48.00    Pack years: 48.00    Types: Cigarettes   Smokeless tobacco: Never Used  Scientific laboratory technician Use: Never used  Substance and Sexual Activity   Alcohol use: No   Drug use: No   Sexual activity: Not on file  Other Topics Concern   Not on file  Social History Narrative   Not on file   Social Determinants of Health   Financial Resource Strain:    Difficulty of Paying Living Expenses: Not on file  Food Insecurity:    Worried About Charity fundraiser in the Last Year: Not on file   [employer] of Food in the Last Year: Not on file  Transportation Needs:    Lack of Transportation (Medical): Not on file   Lack of Transportation (Non-Medical): Not on file  Physical Activity:    Days of Exercise per Week: Not on file   Minutes of Exercise per Session: Not on file  Stress:    Feeling of  Stress : Not on file  Social Connections:    Frequency of Communication with Friends and Family: Not on file   Frequency of Social Gatherings with Friends and Family: Not on file   Attends Religious Services: Not on file   Active Member of Clubs or Organizations: Not on file   Attends Archivist Meetings: Not on file   Marital Status: Not on file     Family History: The patient's family history includes Hyperlipidemia in her father and mother; Melanoma in her sister. There is no history of Breast cancer.  ROS:   Please see the history of present illness.     All other systems reviewed and are negative.  EKGs/Labs/Other Studies Reviewed:    The following studies were reviewed today:   EKG:  EKG is  ordered today.  The ekg ordered today demonstrates normal sinus rhythm, normal ECG.  Recent Labs: 02/01/2019: B Natriuretic Peptide 48.0 02/11/2019: Magnesium 1.8 05/14/2019: ALT 20 06/17/2019: Hemoglobin 13.7; Platelets 277 10/04/2019: BUN 10; Creatinine, Ser 0.85; Potassium 4.1; Sodium 141 01/07/2020: TSH 0.645  Recent Lipid Panel    Component Value Date/Time   CHOL 183 01/07/2020 0947   CHOL 194 02/22/2017 0847   TRIG 186 (H) 01/07/2020 0947   TRIG 227 (H) 02/22/2017 0847   HDL 43 01/07/2020 0947   CHOLHDL 5.2 (H) 05/09/2018 1447   VLDL 45 (H) 02/22/2017 0847   LDLCALC 108 (H) 01/07/2020 0947    Physical Exam:    VS:  BP 120/80 (BP Location: Right Arm, Patient Position: Sitting, Cuff Size: Normal)    Pulse 85    Ht _0  (1.702 m)    Wt 211 lb 6 oz (95.9 kg)    SpO2 98%    BMI 33.11 kg/m     Wt Readings from Last 3 Encounters:  01/09/20 211 lb 6 oz (95.9 kg)  01/07/20 212 lb (96.2 kg)  10/04/19 210 lb (95.3 kg)     GEN:  Well nourished, well developed in no acute distress HEENT: Normal NECK: No JVD; No carotid bruits LYMPHATICS: No lymphadenopathy CARDIAC: RRR, no murmurs,  rubs, gallops RESPIRATORY:  Clear to auscultation without rales, wheezing or  rhonchi  ABDOMEN: Soft, non-tender, non-distended MUSCULOSKELETAL:  No edema; No deformity  SKIN: Warm and dry NEUROLOGIC:  Alert and oriented x 3 PSYCHIATRIC:  Normal affect   ASSESSMENT:    1. Chest pain of uncertain etiology   2. Primary hypertension   3. Pure hypercholesterolemia   4. Smoking   5. Precordial pain   6. Pre-procedure lab exam    PLAN:    In order of problems listed above:  1. Patient with chest pain, cardiac risk factors of hypertension, hyperlipidemia, diabetes, current smoker.  Get echocardiogram to evaluate cardiac function, get coronary CTA to evaluate CAD. 2. History of hypertension, BP controlled, continue losartan as prescribed. 3. History of hyperlipidemia, continue statin as prescribed. 4. Patient is a current smoker, smoking cessation advised.  Follow-up after echo and coronary CTA.   Medication Adjustments/Labs and Tests Ordered: Current medicines are reviewed at length with the patient today.  Concerns regarding medicines are outlined above.  Orders Placed This Encounter  Procedures   CT CORONARY MORPH W/CTA COR W/SCORE W/CA W/CM &/OR WO/CM   CT CORONARY FRACTIONAL FLOW RESERVE DATA PREP   CT CORONARY FRACTIONAL FLOW RESERVE FLUID ANALYSIS   Basic metabolic panel   EKG [id number]   ECHOCARDIOGRAM COMPLETE   Meds ordered this encounter  Medications   metoprolol tartrate (LOPRESSOR) 100 MG tablet    Sig: Take 1 tablet (100 mg total) by mouth once for 1 dose. Take 2 hours prior to your CT scan.    Dispense:  1 tablet    Refill:  0    Patient Instructions  Medication Instructions:   See CTA day of instructions.  *If you need a refill on your cardiac medications before your next appointment, please call your pharmacy*   Lab Work: Your physician recommends that you return for lab work in:  After you get scheduled for your CTA.  If you have labs (blood work) drawn today and your tests are completely normal, you will receive your  results only by:  [place] (if you have MyChart) OR  A paper copy in the mail If you have any lab test that is abnormal or we need to change your treatment, we will call you to review the results.   Testing/Procedures:  1)  Your physician has requested that you have an echocardiogram. Echocardiography is a painless test that uses sound waves to create images of your heart. It provides your doctor with information about the size and shape of your heart and how well your hearts chambers and valves are working. This procedure takes approximately one hour. There are no restrictions for this procedure.   2)  Your physician has requested that you have cardiac CT. Cardiac computed tomography (CT) is a painless test that uses an x-ray machine to take clear, detailed pictures of your heart.    Your cardiac CT will be scheduled at:  [facility] [address] [place], [place] [address] [phone]   Please arrive 15 mins early for check-in and test prep.   Please follow these instructions carefully (unless otherwise directed):   On the Night Before the Test:  Be sure to Drink plenty of water.  Do not consume any caffeinated/decaffeinated beverages or chocolate 12 hours prior to your test.  Do not take any antihistamines 12 hours prior to your test.   On the Day of the Test:  Drink plenty of  water. Do not drink any water within one hour of the test.  Do not eat any food 4 hours prior to the test.  You may take your regular medications prior to the test.   Take metoprolol (Lopressor) two hours prior to test.  HOLD losartan (COZAAR) 25 MG tablet  FEMALES- please wear underwire-free bra if available        After the Test:  Drink plenty of water.  After receiving IV contrast, you may experience a mild flushed feeling. This is normal.  On occasion, you may experience a mild rash up to 24 hours after the test. This is  not dangerous. If this occurs, you can take Benadryl 25 mg and increase your fluid intake.  If you experience trouble breathing, this can be serious. If it is severe call 911 IMMEDIATELY. If it is mild, please call our office.  If you take any of these medications: Glipizide/Metformin, Avandament, Glucavance, please do not take 48 hours after completing test unless otherwise instructed.   Once we have confirmed authorization from your insurance company, we will call you to set up a date and time for your test. Based on how quickly your insurance processes prior authorizations requests, please allow up to 4 weeks to be contacted for scheduling your Cardiac CT appointment. Be advised that routine Cardiac CT appointments could be scheduled as many as 8 weeks after your provider has ordered it.  For non-scheduling related questions, please contact the cardiac imaging nurse navigator should you have any questions/concerns: Marchia Bond, Cardiac Imaging Nurse Navigator Burley Saver, Interim Cardiac Imaging Nurse [place] [facility] Direct Office Dial: [phone]   For scheduling needs, including cancellations and rescheduling, please call Vivien Rota at [phone], option [phone].       Follow-Up: At [facility], you and your health needs are our priority.  As part of our continuing mission to provide you with exceptional heart care, we have created designated Provider Care Teams.  These Care Teams include your primary Cardiologist (physician) and Advanced Practice Providers (APPs -  Physician Assistants and Nurse Practitioners) who all work together to provide you with the care you need, when you need it.  We recommend signing up for the patient portal called "MyChart".  Sign up information is provided on this After Visit Summary.  MyChart is used to connect with patients for Virtual Visits (Telemedicine).  Patients are able to view lab/test results, encounter notes, upcoming  appointments, etc.  Non-urgent messages can be sent to your provider as well.   To learn more about what you can do with MyChart, go to [email or web address].    Your next appointment:   Follow up after Echo and Myoview   The format for your next appointment:   In Person  Provider:   Kate Sable, MD   Other Instructions      Signed, Kate Sable, MD  01/09/2020 12:29 PM    [place]

## 2020-01-22 ENCOUNTER — Telehealth (HOSPITAL_COMMUNITY): Payer: Self-pay | Admitting: Emergency Medicine

## 2020-01-22 NOTE — Telephone Encounter (Signed)
Attempted to call patient regarding upcoming cardiac CT appointment. °Left message on voicemail with name and callback number °Rory Xiang RN Navigator Cardiac Imaging °[facility] °[phone] Office °[phone] Cell ° °

## 2020-01-22 NOTE — Telephone Encounter (Signed)
Pt returning phone call regarding upcoming cardiac imaging study; pt verbalizes understanding of appt date/time, parking situation and where to check in, pre-test NPO status and medications ordered, and verified current allergies; name and call back number provided for further questions should they arise Marchia Bond RN Navigator Cardiac Imaging [facility] [phone] office [phone] cell  Pt understands to take 100mg  metoprolol 2 hr prior to scan Seletha Zimmermann

## 2020-01-23 ENCOUNTER — Other Ambulatory Visit: Payer: Medicare Other

## 2020-01-23 ENCOUNTER — Other Ambulatory Visit: Payer: Self-pay

## 2020-01-23 ENCOUNTER — Ambulatory Visit
Admission: RE | Admit: 2020-01-23 | Discharge: 2020-01-23 | Disposition: A | Discharge status: Home / Self Care | Payer: Medicare Other | Source: Ambulatory Visit | Attending: Cardiology | Admitting: Cardiology

## 2020-01-23 DIAGNOSIS — R072 Precordial pain: Secondary | ICD-10-CM | POA: Insufficient documentation

## 2020-01-23 LAB — POCT I-STAT CREATININE: Creatinine, Ser: 0.8 mg/dL (ref 0.44–1.00)

## 2020-01-23 MED ORDER — IOHEXOL 350 MG/ML SOLN
100.0000 mL | Freq: Once | INTRAVENOUS | Status: AC | PRN
  Administered 2020-01-23: 100 mL via INTRAVENOUS

## 2020-01-23 MED ORDER — NITROGLYCERIN 0.4 MG SL SUBL
0.8000 mg | SUBLINGUAL_TABLET | Freq: Once | SUBLINGUAL | Status: AC
  Administered 2020-01-23: 0.8 mg via SUBLINGUAL

## 2020-01-23 NOTE — Progress Notes (Signed)
Patient tolerated procedure well. Ambulate w/o difficulty. Sitting in chair drinking coffee and water provided. Encouraged to drink extra water today and reasoning explained. Verbalized understanding. All questions answered. ABC intact. No further needs. Discharge from procedure area w/o issues.

## 2020-01-28 ENCOUNTER — Other Ambulatory Visit: Payer: Medicare Other

## 2020-02-04 ENCOUNTER — Ambulatory Visit: Payer: Medicare Other | Admitting: Nurse Practitioner

## 2020-02-13 ENCOUNTER — Ambulatory Visit: Payer: Medicare Other | Admitting: Cardiology

## 2020-02-21 DIAGNOSIS — Z471 Aftercare following joint replacement surgery: Secondary | ICD-10-CM | POA: Diagnosis not present

## 2020-02-21 DIAGNOSIS — Z96611 Presence of right artificial shoulder joint: Secondary | ICD-10-CM | POA: Diagnosis not present

## 2020-03-09 DIAGNOSIS — M542 Cervicalgia: Secondary | ICD-10-CM | POA: Diagnosis not present

## 2020-03-09 DIAGNOSIS — M25562 Pain in left knee: Secondary | ICD-10-CM | POA: Diagnosis not present

## 2020-04-01 ENCOUNTER — Other Ambulatory Visit: Payer: Self-pay | Admitting: Nurse Practitioner

## 2020-04-01 NOTE — Telephone Encounter (Signed)
Approved per protocol. Requested Prescriptions  Pending Prescriptions Disp Refills   traZODone (DESYREL) 100 MG tablet [Pharmacy Med Name: TRAZODONE HCL 100 MG TAB] 90 tablet 0    Sig: TAKE 1 TABLET BY MOUTH AT BEDTIME AS NEEDED FOR SLEEP     Psychiatry: Antidepressants - Serotonin Modulator Passed - 04/01/2020 10:31 AM      Passed - Completed PHQ-2 or PHQ-9 in the last 360 days      Passed - Valid encounter within last 6 months    Recent Outpatient Visits          2 months ago Hypertension associated with diabetes (HCC)   [facility] [place], Jolene T, NP   6 months ago Type 2 diabetes, controlled, with peripheral circulatory disorder (HCC)   [facility] [place], Corrie Dandy T, NP   7 months ago Rash   [facility] [place], [place], [place]   9 months ago Senile purpura (HCC)   [facility] [place], Jolene T, NP   10 months ago Type 2 diabetes, controlled, with peripheral circulatory disorder [facility])   [facility] [place], Dorie Rank, NP

## 2020-04-07 ENCOUNTER — Ambulatory Visit: Payer: Medicare Other | Admitting: Podiatry

## 2020-04-07 ENCOUNTER — Other Ambulatory Visit: Payer: Self-pay

## 2020-04-07 ENCOUNTER — Ambulatory Visit (INDEPENDENT_AMBULATORY_CARE_PROVIDER_SITE_OTHER): Payer: Medicare Other

## 2020-04-07 DIAGNOSIS — M722 Plantar fascial fibromatosis: Secondary | ICD-10-CM | POA: Diagnosis not present

## 2020-04-07 DIAGNOSIS — I739 Peripheral vascular disease, unspecified: Secondary | ICD-10-CM | POA: Diagnosis not present

## 2020-04-07 DIAGNOSIS — L989 Disorder of the skin and subcutaneous tissue, unspecified: Secondary | ICD-10-CM | POA: Diagnosis not present

## 2020-04-07 NOTE — Progress Notes (Signed)
   Subjective: 60 year old female presenting today with a chief complaint of constant sharp, stabbing pain to the plantar left heel secondary to a callus lesion that has been present for the past 3-4 months. Walking increases the pain. She has not done anything for treatment. She states she has circulation issues and is treated by Dr. Wyn Quaker. Patient is here for further evaluation and treatment.   Past Medical History:  Diagnosis Date  . Asthma   . Cancer (HCC)    stomach  . Chronic back pain   . COPD (chronic obstructive pulmonary disease) (HCC)   . Degenerative joint disease (DJD) of hip   . Diabetes mellitus without complication (HCC)   . Dyspnea   . GERD (gastroesophageal reflux disease)    takes Omeprazole daily  . History of bronchitis yrs ago  . History of colon polyps    benign  . History of staph infection 10 yrs ago  . Hyperlipidemia    takes Atorvastatin daily  . Hypertension   . Peripheral vascular disease (HCC)   . Tobacco use    8 cigarettes daily   . Weakness    numbness and tingling in both legs      Objective:  Physical Exam General: Alert and oriented x3 in no acute distress  Dermatology: Hyperkeratotic lesion present on the left heel. Pain on palpation with a central nucleated core noted.  Skin is warm, dry and supple bilateral lower extremities. Negative for open lesions or macerations.  Vascular: Palpable pedal pulses bilaterally. No edema or erythema noted. Capillary refill within normal limits.  Neurological: Epicritic and protective threshold diminished bilaterally.   Musculoskeletal Exam: Pain on palpation at the keratotic lesion noted. Range of motion within normal limits bilateral. Muscle strength 5/5 in all groups bilateral.  Radiographic Exam:  Normal osseous mineralization. Joint spaces preserved. No fracture/dislocation/boney destruction.    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 X-Rays reviewed.  #2 Excisional debridement of keratotic lesion using a chisel blade was performed without incident.  #3 Dressed area with light dressing. #4 Continue PVD management with Dr. Wyn Quaker, Vascular.  #5 Recommended good shoe gear. Patient admits to walking barefoot on hardwood floors daily.  #6 Patient is to return to the clinic PRN.    Felecia Shelling, DPM [facility]  Dr. Felecia Shelling, DPM    [address]                                        [place], [place] [address]                Office [phone]  Fax [phone]

## 2020-04-09 DIAGNOSIS — J381 Polyp of vocal cord and larynx: Secondary | ICD-10-CM | POA: Diagnosis not present

## 2020-04-09 DIAGNOSIS — K219 Gastro-esophageal reflux disease without esophagitis: Secondary | ICD-10-CM | POA: Diagnosis not present

## 2020-05-12 ENCOUNTER — Encounter (INDEPENDENT_AMBULATORY_CARE_PROVIDER_SITE_OTHER): Payer: Medicare Other

## 2020-05-12 ENCOUNTER — Ambulatory Visit (INDEPENDENT_AMBULATORY_CARE_PROVIDER_SITE_OTHER): Payer: Medicare Other | Admitting: Vascular Surgery

## 2020-05-13 ENCOUNTER — Ambulatory Visit (INDEPENDENT_AMBULATORY_CARE_PROVIDER_SITE_OTHER): Payer: Medicare Other | Admitting: Nurse Practitioner

## 2020-05-13 ENCOUNTER — Encounter: Payer: Self-pay | Admitting: Nurse Practitioner

## 2020-05-13 ENCOUNTER — Other Ambulatory Visit: Payer: Self-pay

## 2020-05-13 VITALS — BP 122/78 | HR 82 | Temp 97.5°F | Ht 67.05 in | Wt 220.2 lb

## 2020-05-13 DIAGNOSIS — F17219 Nicotine dependence, cigarettes, with unspecified nicotine-induced disorders: Secondary | ICD-10-CM | POA: Diagnosis not present

## 2020-05-13 DIAGNOSIS — E785 Hyperlipidemia, unspecified: Secondary | ICD-10-CM

## 2020-05-13 DIAGNOSIS — I75023 Atheroembolism of bilateral lower extremities: Secondary | ICD-10-CM | POA: Diagnosis not present

## 2020-05-13 DIAGNOSIS — E538 Deficiency of other specified B group vitamins: Secondary | ICD-10-CM | POA: Diagnosis not present

## 2020-05-13 DIAGNOSIS — H6502 Acute serous otitis media, left ear: Secondary | ICD-10-CM

## 2020-05-13 DIAGNOSIS — E1159 Type 2 diabetes mellitus with other circulatory complications: Secondary | ICD-10-CM

## 2020-05-13 DIAGNOSIS — J432 Centrilobular emphysema: Secondary | ICD-10-CM

## 2020-05-13 DIAGNOSIS — E1169 Type 2 diabetes mellitus with other specified complication: Secondary | ICD-10-CM | POA: Diagnosis not present

## 2020-05-13 DIAGNOSIS — I152 Hypertension secondary to endocrine disorders: Secondary | ICD-10-CM | POA: Diagnosis not present

## 2020-05-13 DIAGNOSIS — I700 Atherosclerosis of aorta: Secondary | ICD-10-CM

## 2020-05-13 DIAGNOSIS — E1151 Type 2 diabetes mellitus with diabetic peripheral angiopathy without gangrene: Secondary | ICD-10-CM

## 2020-05-13 DIAGNOSIS — D692 Other nonthrombocytopenic purpura: Secondary | ICD-10-CM

## 2020-05-13 DIAGNOSIS — H6692 Otitis media, unspecified, left ear: Secondary | ICD-10-CM | POA: Insufficient documentation

## 2020-05-13 DIAGNOSIS — D519 Vitamin B12 deficiency anemia, unspecified: Secondary | ICD-10-CM | POA: Diagnosis not present

## 2020-05-13 DIAGNOSIS — H6690 Otitis media, unspecified, unspecified ear: Secondary | ICD-10-CM | POA: Insufficient documentation

## 2020-05-13 DIAGNOSIS — F411 Generalized anxiety disorder: Secondary | ICD-10-CM

## 2020-05-13 LAB — MICROALBUMIN, URINE WAIVED
Creatinine, Urine Waived: 200 mg/dL (ref 10–300)
Microalb, Ur Waived: 30 mg/L — ABNORMAL HIGH (ref 0–19)
Microalb/Creat Ratio: <30 mg/g (ref <30)

## 2020-05-13 LAB — BAYER DCA HB A1C WAIVED: HB A1C (BAYER DCA - WAIVED): 6.7 % (ref <7.0)

## 2020-05-13 MED ORDER — PANTOPRAZOLE SODIUM 40 MG PO TBEC: 40.0000 mg | DELAYED_RELEASE_TABLET | Freq: Every day | ORAL | 4 refills | Status: DC

## 2020-05-13 MED ORDER — HYDROXYZINE HCL 25 MG PO TABS: ORAL_TABLET | ORAL | 1 refills | Status: DC

## 2020-05-13 MED ORDER — AMOXICILLIN-POT CLAVULANATE 875-125 MG PO TABS: 1.0000 | ORAL_TABLET | Freq: Two times a day (BID) | ORAL | 0 refills | Status: AC

## 2020-05-13 MED ORDER — MELOXICAM 15 MG PO TABS: 15.0000 mg | ORAL_TABLET | Freq: Every day | ORAL | 3 refills | Status: DC

## 2020-05-13 MED ORDER — CITALOPRAM HYDROBROMIDE 20 MG PO TABS: 20.0000 mg | ORAL_TABLET | Freq: Every day | ORAL | 3 refills | Status: DC

## 2020-05-13 NOTE — Assessment & Plan Note (Signed)
Chronic, ongoing noted on lung CT screening.  Continue statin and ASA daily for prevention.  Continue collaboration with vascular.  Recommend complete cessation smoking. 

## 2020-05-13 NOTE — Assessment & Plan Note (Signed)
New acute increased anxiety due to social situation.  Denies SI/HI.  GAD = 18 and PHQ9 = 14.  Will start Celexa 20 MG daily and refill her Vistaril, which she had been taking with dermatology, to use for acute increased anxiety while Celexa getting to level of benefit.  Educated her on Celexa and period it takes to get to full benefit, plus side effects.  Plan for return to office in a couple weeks to recheck ear and assess mood.  Sooner if worsening mood.

## 2020-05-13 NOTE — Assessment & Plan Note (Signed)
Chronic, ongoing with A1C today 6.7%.  Urine ALB 30 and A:C <30 today, improved.  Continue Losartan for kidney protection and check BMP.  Continue current medication regimen (Metformin and Ozempic) + checking BS at home daily.  Monitor diet at home and reduce carbs and foods high in sugar.  Continue to collaborate with vascular, recommend she reschedule recent missed visit.  Return in 3 months.
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
## 2020-05-13 NOTE — Assessment & Plan Note (Signed)
Chronic, continue supplement and recheck level today.  Is on long term Metformin. 

## 2020-05-13 NOTE — Assessment & Plan Note (Signed)
Chronic, ongoing.  Continue collaboration with vascular.

## 2020-05-13 NOTE — Assessment & Plan Note (Signed)
Bilateral upper extremity.  Recommend gentle skin cleansing daily and application of lotion daily.  Monitor skin and notify provider if any abrasions or wounds. 

## 2020-05-13 NOTE — Patient Instructions (Signed)
Otitis Media, Adult  Otitis media is a condition in which the middle ear is red and swollen (inflamed) and full of fluid. The middle ear is the part of the ear that contains bones for hearing as well as air that helps send sounds to the brain. The condition usually goes away on its own. What are the causes? This condition is caused by a blockage in the eustachian tube. The eustachian tube connects the middle ear to the back of the nose. It normally allows air into the middle ear. The blockage is caused by fluid or swelling. Problems that can cause blockage include:  A cold or infection that affects the nose, mouth, or throat.  Allergies.  An irritant, such as tobacco smoke.  Adenoids that have become large. The adenoids are soft tissue located in the back of the throat, behind the nose and the roof of the mouth.  Growth or swelling in the upper part of the throat, just behind the nose (nasopharynx).  Damage to the ear caused by change in pressure. This is called barotrauma. What are the signs or symptoms? Symptoms of this condition include:  Ear pain.  Fever.  Problems with hearing.  Being tired.  Fluid leaking from the ear.  Ringing in the ear. How is this treated? This condition can go away on its own within 3-5 days. But if the condition is caused by bacteria or does not go away on its own, or if it keeps coming back, your doctor may:  Give you antibiotic medicines.  Give you medicines for pain. Follow these instructions at home:  Take over-the-counter and prescription medicines only as told by your doctor.  If you were prescribed an antibiotic medicine, take it as told by your doctor. Do not stop taking the antibiotic even if you start to feel better.  Keep all follow-up visits as told by your doctor. This is important. Contact a doctor if:  You have bleeding from your nose.  There is a lump on your neck.  You are not feeling better in 5 days.  You feel worse  instead of better. Get help right away if:  You have pain that is not helped with medicine.  You have swelling, redness, or pain around your ear.  You get a stiff neck.  You cannot move part of your face (paralysis).  You notice that the bone behind your ear hurts when you touch it.  You get a very bad headache. Summary  Otitis media means that the middle ear is red, swollen, and full of fluid.  This condition usually goes away on its own.  If the problem does not go away, treatment may be needed. You may be given medicines to treat the infection or to treat your pain.  If you were prescribed an antibiotic medicine, take it as told by your doctor. Do not stop taking the antibiotic even if you start to feel better.  Keep all follow-up visits as told by your doctor. This is important. This information is not intended to replace advice given to you by your health care provider. Make sure you discuss any questions you have with your health care provider. Document Revised: 02/21/2019 Document Reviewed: 02/21/2019 Elsevier Patient Education  2021 [employer].  

## 2020-05-13 NOTE — Assessment & Plan Note (Signed)
I have recommended complete cessation of tobacco use. I have discussed various options available for assistance with tobacco cessation including over the counter methods (Nicotine gum, patch and lozenges). We also discussed prescription options (Chantix, Nicotine Inhaler / Nasal Spray). The patient is not interested in pursuing any prescription tobacco cessation options at this time.  Continue yearly lung screening.  

## 2020-05-13 NOTE — Assessment & Plan Note (Signed)
Chronic, ongoing.  Continue Albuterol as needed and Breztri -- is tolerating well.  Recommend complete cessation of smoking.  Continue annual CT scans.  Consider pulmonary referral if worsening.  Spirometry next visit.  Return in 3 months.

## 2020-05-13 NOTE — Assessment & Plan Note (Signed)
Chronic, stable with BP below goal.  Continue Losartan for ongoing kidney protection in diabetes.  Recommend she monitor BP at home regularly and document for provider + focus on DASH diet.  BMP today.  Return in 3 months.

## 2020-05-13 NOTE — Assessment & Plan Note (Addendum)
Chronic, ongoing.  Continue Crestor and adjust dosing as needed or add on Zetia if poor control.  Lipid check today.

## 2020-05-13 NOTE — Assessment & Plan Note (Signed)
Acute, x one week.  Will send in script for Augmentin, educated her on this.  TM intact.  Will plan to see her back in office in 2 weeks to recheck, sooner if worsening symptoms.  May take Tylenol as needed for discomfort.

## 2020-05-14 ENCOUNTER — Telehealth: Payer: Self-pay

## 2020-05-14 LAB — CBC WITH DIFFERENTIAL/PLATELET
Basophils Absolute: 0.1 10*3/uL (ref 0.0–0.2)
Basos: 1 %
EOS (ABSOLUTE): 0.1 10*3/uL (ref 0.0–0.4)
Eos: 1 %
Hematocrit: 44.2 % (ref 34.0–46.6)
Hemoglobin: 14.7 g/dL (ref 11.1–15.9)
Immature Grans (Abs): 0 10*3/uL (ref 0.0–0.1)
Immature Granulocytes: 1 %
Lymphocytes Absolute: 2.9 10*3/uL (ref 0.7–3.1)
Lymphs: 34 %
MCH: 29.2 pg (ref 26.6–33.0)
MCHC: 33.3 g/dL (ref 31.5–35.7)
MCV: 88 fL (ref 79–97)
Monocytes Absolute: 0.5 10*3/uL (ref 0.1–0.9)
Monocytes: 5 %
Neutrophils Absolute: 4.9 10*3/uL (ref 1.4–7.0)
Neutrophils: 58 %
Platelets: 279 10*3/uL (ref 150–450)
RBC: 5.03 x10E6/uL (ref 3.77–5.28)
RDW: 12.3 % (ref 11.7–15.4)
WBC: 8.4 10*3/uL (ref 3.4–10.8)

## 2020-05-14 LAB — LIPID PANEL
Chol/HDL Ratio: 4.1 ratio (ref 0.0–4.4)
Cholesterol, Total: 181 mg/dL (ref 100–199)
HDL: 44 mg/dL (ref >39)
LDL Chol Calc (NIH): 101 mg/dL — ABNORMAL HIGH (ref 0–99)
Triglycerides: 211 mg/dL — ABNORMAL HIGH (ref 0–149)
VLDL Cholesterol Cal: 36 mg/dL (ref 5–40)

## 2020-05-14 LAB — BASIC METABOLIC PANEL
BUN/Creatinine Ratio: 12 (ref 12–28)
BUN: 10 mg/dL (ref 8–27)
CO2: 25 mmol/L (ref 20–29)
Calcium: 9.4 mg/dL (ref 8.7–10.3)
Chloride: 104 mmol/L (ref 96–106)
Creatinine, Ser: 0.84 mg/dL (ref 0.57–1.00)
GFR calc Af Amer: 86 mL/min/{1.73_m2} (ref >59)
GFR calc non Af Amer: 75 mL/min/{1.73_m2} (ref >59)
Glucose: 110 mg/dL — ABNORMAL HIGH (ref 65–99)
Potassium: 4.6 mmol/L (ref 3.5–5.2)
Sodium: 144 mmol/L (ref 134–144)

## 2020-05-14 LAB — VITAMIN B12: Vitamin B-12: 323 pg/mL (ref 232–1245)

## 2020-05-14 NOTE — Progress Notes (Signed)
Contacted via [place] afternoon Stacey Gross, your labs have returned.  Kidney function remains stable.  Cholesterol levels show LDL above the goal at 101, would like to see less then 70 for stroke prevention.  Please ensure you are taking your Rosuvastatin daily.  B12 level remains on lower normal side.  Are you taking your B12 supplement daily?  CBC is normal.  Any questions? Keep being awesome!!  Thank you for allowing me to participate in your care. Kindest regards, Blossom Crume

## 2020-05-14 NOTE — Telephone Encounter (Signed)
[employer] sent fax requesting RFs of Hydroxyzine. Okay to RF?

## 2020-05-15 ENCOUNTER — Ambulatory Visit (INDEPENDENT_AMBULATORY_CARE_PROVIDER_SITE_OTHER): Payer: Medicare Other

## 2020-05-15 VITALS — Ht 67.0 in | Wt 220.0 lb

## 2020-05-15 DIAGNOSIS — Z0000 Encounter for general adult medical examination without abnormal findings: Secondary | ICD-10-CM

## 2020-05-15 NOTE — Patient Instructions (Signed)
Stacey Gross , Thank you for taking time to come for your Medicare Wellness Visit. I appreciate your ongoing commitment to your health goals. Please review the following plan we discussed and let me know if I can assist you in the future.   Screening recommendations/referrals: Colonoscopy: completed 04/24/2018 Mammogram: due Bone Density: completed 11/07/2013 Recommended yearly ophthalmology/optometry visit for glaucoma screening and checkup Recommended yearly dental visit for hygiene and checkup  Vaccinations: Influenza vaccine: completed 01/07/2020, due 11/02/2020 Pneumococcal vaccine: completed 02/09/2016 Tdap vaccine: completed 05/13/2014 Shingles vaccine: completed  Covid-19:  decline  Advanced directives: Please bring a copy of your POA (Power of [place]) and/or Living Will to your next appointment.   Conditions/risks identified: smoking  Next appointment: Follow up in one year for your annual wellness visit.   Preventive Care 40-64 Years, Female Preventive care refers to lifestyle choices and visits with your health care provider that can promote health and wellness. What does preventive care include?  A yearly physical exam. This is also called an annual well check.  Dental exams once or twice a year.  Routine eye exams. Ask your health care provider how often you should have your eyes checked.  Personal lifestyle choices, including:  Daily care of your teeth and gums.  Regular physical activity.  Eating a healthy diet.  Avoiding tobacco and drug use.  Limiting alcohol use.  Practicing safe sex.  Taking low-dose aspirin daily starting at age 1.  Taking vitamin and mineral supplements as recommended by your health care provider. What happens during an annual well check? The services and screenings done by your health care provider during your annual well check will depend on your age, overall health, lifestyle risk factors, and family history of disease. Counseling   Your health care provider may ask you questions about your:  Alcohol use.  Tobacco use.  Drug use.  Emotional well-being.  Home and relationship well-being.  Sexual activity.  Eating habits.  Work and work Statistician.  Method of birth control.  Menstrual cycle.  Pregnancy history. Screening  You may have the following tests or measurements:  Height, weight, and BMI.  Blood pressure.  Lipid and cholesterol levels. These may be checked every 5 years, or more frequently if you are over 40 years old.  Skin check.  Lung cancer screening. You may have this screening every year starting at age 41 if you have a 30-pack-year history of smoking and currently smoke or have quit within the past 15 years.  Fecal occult blood test (FOBT) of the stool. You may have this test every year starting at age 52.  Flexible sigmoidoscopy or colonoscopy. You may have a sigmoidoscopy every 5 years or a colonoscopy every 10 years starting at age 45.  Hepatitis C blood test.  Hepatitis B blood test.  Sexually transmitted disease (STD) testing.  Diabetes screening. This is done by checking your blood sugar (glucose) after you have not eaten for a while (fasting). You may have this done every 1-3 years.  Mammogram. This may be done every 1-2 years. Talk to your health care provider about when you should start having regular mammograms. This may depend on whether you have a family history of breast cancer.  BRCA-related cancer screening. This may be done if you have a family history of breast, ovarian, tubal, or peritoneal cancers.  Pelvic exam and Pap test. This may be done every 3 years starting at age 60. Starting at age 26, this may be done every 5 years  if you have a Pap test in combination with an HPV test.  Bone density scan. This is done to screen for osteoporosis. You may have this scan if you are at high risk for osteoporosis. Discuss your test results, treatment options, and if  necessary, the need for more tests with your health care provider. Vaccines  Your health care provider may recommend certain vaccines, such as:  Influenza vaccine. This is recommended every year.  Tetanus, diphtheria, and acellular pertussis (Tdap, Td) vaccine. You may need a Td booster every 10 years.  Zoster vaccine. You may need this after age 36.  Pneumococcal 13-valent conjugate (PCV13) vaccine. You may need this if you have certain conditions and were not previously vaccinated.  Pneumococcal polysaccharide (PPSV23) vaccine. You may need one or two doses if you smoke cigarettes or if you have certain conditions. Talk to your health care provider about which screenings and vaccines you need and how often you need them. This information is not intended to replace advice given to you by your health care provider. Make sure you discuss any questions you have with your health care provider. Document Released: 04/17/2015 Document Revised: 12/09/2015 Document Reviewed: 01/20/2015 Elsevier Interactive Patient Education  2017 [employer] Prevention in the Home Falls can cause injuries. They can happen to people of all ages. There are many things you can do to make your home safe and to help prevent falls. What can I do on the outside of my home?  Regularly fix the edges of walkways and driveways and fix any cracks.  Remove anything that might make you trip as you walk through a door, such as a raised step or threshold.  Trim any bushes or trees on the path to your home.  Use bright outdoor lighting.  Clear any walking paths of anything that might make someone trip, such as rocks or tools.  Regularly check to see if handrails are loose or broken. Make sure that both sides of any steps have handrails.  Any raised decks and porches should have guardrails on the edges.  Have any leaves, snow, or ice cleared regularly.  Use sand or salt on walking paths during  winter.  Clean up any spills in your garage right away. This includes oil or grease spills. What can I do in the bathroom?  Use night lights.  Install grab bars by the toilet and in the tub and shower. Do not use towel bars as grab bars.  Use non-skid mats or decals in the tub or shower.  If you need to sit down in the shower, use a plastic, non-slip stool.  Keep the floor dry. Clean up any water that spills on the floor as soon as it happens.  Remove soap buildup in the tub or shower regularly.  Attach bath mats securely with double-sided non-slip rug tape.  Do not have throw rugs and other things on the floor that can make you trip. What can I do in the bedroom?  Use night lights.  Make sure that you have a light by your bed that is easy to reach.  Do not use any sheets or blankets that are too big for your bed. They should not hang down onto the floor.  Have a firm chair that has side arms. You can use this for support while you get dressed.  Do not have throw rugs and other things on the floor that can make you trip. What can I do in  the kitchen?  Clean up any spills right away.  Avoid walking on wet floors.  Keep items that you use a lot in easy-to-reach places.  If you need to reach something above you, use a strong step stool that has a grab bar.  Keep electrical cords out of the way.  Do not use floor polish or wax that makes floors slippery. If you must use wax, use non-skid floor wax.  Do not have throw rugs and other things on the floor that can make you trip. What can I do with my stairs?  Do not leave any items on the stairs.  Make sure that there are handrails on both sides of the stairs and use them. Fix handrails that are broken or loose. Make sure that handrails are as long as the stairways.  Check any carpeting to make sure that it is firmly attached to the stairs. Fix any carpet that is loose or worn.  Avoid having throw rugs at the top or  bottom of the stairs. If you do have throw rugs, attach them to the floor with carpet tape.  Make sure that you have a light switch at the top of the stairs and the bottom of the stairs. If you do not have them, ask someone to add them for you. What else can I do to help prevent falls?  Wear shoes that:  Do not have high heels.  Have rubber bottoms.  Are comfortable and fit you well.  Are closed at the toe. Do not wear sandals.  If you use a stepladder:  Make sure that it is fully opened. Do not climb a closed stepladder.  Make sure that both sides of the stepladder are locked into place.  Ask someone to hold it for you, if possible.  Clearly mark and make sure that you can see:  Any grab bars or handrails.  First and last steps.  Where the edge of each step is.  Use tools that help you move around (mobility aids) if they are needed. These include:  Canes.  Walkers.  Scooters.  Crutches.  Turn on the lights when you go into a dark area. Replace any light bulbs as soon as they burn out.  Set up your furniture so you have a clear path. Avoid moving your furniture around.  If any of your floors are uneven, fix them.  If there are any pets around you, be aware of where they are.  Review your medicines with your doctor. Some medicines can make you feel dizzy. This can increase your chance of falling. Ask your doctor what other things that you can do to help prevent falls. This information is not intended to replace advice given to you by your health care provider. Make sure you discuss any questions you have with your health care provider. Document Released: 01/15/2009 Document Revised: 08/27/2015 Document Reviewed: 04/25/2014 Elsevier Interactive Patient Education  2017 [employer].

## 2020-05-15 NOTE — Progress Notes (Signed)
I connected with Stacey Gross today by telephone and verified that I am speaking with the correct person using two identifiers. Location patient: home Location provider: work Persons participating in the virtual visit: Stacey Gross, Stacey Durand LPN.   I discussed the limitations, risks, security and privacy concerns of performing an evaluation and management service by telephone and the availability of in person appointments. I also discussed with the patient that there may be a patient responsible charge related to this service. The patient expressed understanding and verbally consented to this telephonic visit.    Interactive audio and video telecommunications were attempted between this provider and patient, however failed, due to patient having technical difficulties OR patient did not have access to video capability.  We continued and completed visit with audio only.     Vital signs may be patient reported or missing.  Subjective:   Stacey Gross is a 60 y.o. female who presents for Medicare Annual (Subsequent) preventive examination.  Review of Systems     Cardiac Risk Factors include: diabetes mellitus;dyslipidemia;hypertension;sedentary lifestyle;smoking/ tobacco exposure     Objective:    Today's Vitals   05/15/20 1111  Weight: 220 lb (99.8 kg)  Height: _0  (1.702 m)   Body mass index is 34.46 kg/m.  Advanced Directives 05/15/2020 07/24/2019 05/15/2019 02/01/2019 10/16/2018 04/24/2018 02/01/2018  Does Patient Have a Medical Advance Directive? Yes No Yes No Yes Yes No  Type of Paramedic of [place];Living will - Living will;Healthcare Power of Attorney - Living will - -  Copy of [place] in Chart? No - copy requested - No - copy requested - - - -  Would patient like information on creating a medical advance directive? - No - Patient declined - No - Patient declined - - Yes (MAU/Ambulatory/Procedural Areas - Information  given)    Current Medications (verified) Outpatient Encounter Medications as of 05/15/2020  Medication Sig  . albuterol (PROVENTIL) (2.5 MG/3ML) 0.083% nebulizer solution USE 1 VIAL (3 ML = 2.5 MG TOTAL) BY NEBULIZATION EVERY 6 HOURS AS NEEDED FORWHEEZING OR SHORTNESS OF BREATH  . albuterol (VENTOLIN HFA) 108 (90 Base) MCG/ACT inhaler INHALE 2 PUFFS BY MOUTH INTO THE LUNGS EVERY 6 HOURS AS NEEDED FOR WHEEZING OR SHORTNESS OF BREATH (Patient taking differently: Inhale 2 puffs into the lungs every 6 (six) hours as needed for wheezing or shortness of breath.)  . amoxicillin-clavulanate (AUGMENTIN) 875-125 MG tablet Take 1 tablet by mouth 2 (two) times daily for 7 days.  Marland Kitchen aspirin EC 81 MG tablet Take 1 tablet (81 mg total) by mouth 2 (two) times daily.  . Blood Glucose Monitoring Suppl (ONE TOUCH ULTRA SYSTEM KIT) W/DEVICE KIT 1 kit by Does not apply route once. (Patient taking differently: 1 kit by Does not apply route daily.)  . Budeson-Glycopyrrol-Formoterol (BREZTRI AEROSPHERE) 160-9-4.8 MCG/ACT AERO Inhale 2 puffs into the lungs 2 (two) times daily.  . cilostazol (PLETAL) 100 MG tablet Take 1 tablet (100 mg total) by mouth 2 (two) times daily before a meal.  . citalopram (CELEXA) 20 MG tablet Take 1 tablet (20 mg total) by mouth daily.  . clobetasol (TEMOVATE) 0.05 % external solution Patient to mix solution in 1 jar of CeraVe Cream. Apply to itchy rash twice daily until improved.  . cyclobenzaprine (FLEXERIL) 10 MG tablet TAKE ONE TABLET BY MOUTH AT BEDTIME (Patient taking differently: Take 10 mg by mouth at bedtime as needed for muscle spasms.)  . docusate sodium (COLACE) 100 MG capsule  Take 100 mg by mouth daily as needed for mild constipation.  Marland Kitchen glucose blood test strip Use to check blood sugar three times a day.  . hydrOXYzine (ATARAX/VISTARIL) 25 MG tablet Take 1-2 tablets by mouth every 4-6 hours as needed for itch. May make drowsy.  Marland Kitchen losartan (COZAAR) 25 MG tablet Take 1 tablet (25 mg  total) by mouth daily. (Patient taking differently: Take 25 mg by mouth every evening.)  . meloxicam (MOBIC) 15 MG tablet Take 1 tablet (15 mg total) by mouth daily.  . metFORMIN (GLUCOPHAGE) 500 MG tablet TAKE 1 TABLET BY MOUTH TWICE A DAY WITH A MEAL  . pantoprazole (PROTONIX) 40 MG tablet Take 1 tablet (40 mg total) by mouth daily.  . rosuvastatin (CRESTOR) 20 MG tablet Take 1 tablet (20 mg total) by mouth daily.  . Semaglutide,0.25 or 0.5MG/DOS, (OZEMPIC, 0.25 OR 0.5 MG/DOSE,) 2 MG/1.5ML SOPN Inject 0.5 mg into the skin once a week. (Patient taking differently: Inject 0.5 mg into the skin every Thursday.)  . traZODone (DESYREL) 100 MG tablet TAKE 1 TABLET BY MOUTH AT BEDTIME AS NEEDED FOR SLEEP  . vitamin B-12 (CYANOCOBALAMIN) 1000 MCG tablet Take 1 tablet (1,000 mcg total) by mouth daily.  . metoprolol tartrate (LOPRESSOR) 100 MG tablet Take 1 tablet (100 mg total) by mouth once for 1 dose. Take 2 hours prior to your CT scan.   No facility-administered encounter medications on file as of 05/15/2020.    Allergies (verified) Doxycycline hyclate, Acetaminophen, Aspirin, Latex, and Vancomycin   History: Past Medical History:  Diagnosis Date  . Asthma   . Cancer ([place])    stomach  . Chronic back pain   . COPD (chronic obstructive pulmonary disease) ([place])   . Degenerative joint disease (DJD) of hip   . Diabetes mellitus without complication ([place])   . Dyspnea   . GERD (gastroesophageal reflux disease)    takes Omeprazole daily  . History of bronchitis yrs ago  . History of colon polyps    benign  . History of staph infection 10 yrs ago  . Hyperlipidemia    takes Atorvastatin daily  . Hypertension   . Peripheral vascular disease ([place])   . Tobacco use    8 cigarettes daily   . Weakness    numbness and tingling in both legs    Past Surgical History:  Procedure Laterality Date  . ABDOMINAL HYSTERECTOMY     total  . APPENDECTOMY    . BACK SURGERY  9-10 yrs ago  . BREAST BIOPSY  Right    Core- neg  . COLONOSCOPY    . COLONOSCOPY WITH PROPOFOL N/A 09/20/2017   Procedure: COLONOSCOPY WITH PROPOFOL;  Surgeon: Virgel Manifold, MD;  Location: [facility] ENDOSCOPY;  Service: Endoscopy;  Laterality: N/A;  . COLONOSCOPY WITH PROPOFOL N/A 04/24/2018   Procedure: COLONOSCOPY WITH PROPOFOL;  Surgeon: Lucilla Lame, MD;  Location: [facility] ENDOSCOPY;  Service: Endoscopy;  Laterality: N/A;  . ESOPHAGOGASTRODUODENOSCOPY (EGD) WITH PROPOFOL N/A 09/20/2017   Procedure: ESOPHAGOGASTRODUODENOSCOPY (EGD) WITH PROPOFOL;  Surgeon: Virgel Manifold, MD;  Location: [facility] ENDOSCOPY;  Service: Endoscopy;  Laterality: N/A;  . ESOPHAGOGASTRODUODENOSCOPY (EGD) WITH PROPOFOL N/A 11/20/2018   Procedure: ESOPHAGOGASTRODUODENOSCOPY (EGD) WITH PROPOFOL;  Surgeon: Lucilla Lame, MD;  Location: [facility] ENDOSCOPY;  Service: Endoscopy;  Laterality: N/A;  . PERIPHERAL VASCULAR CATHETERIZATION Left 03/17/2016   Procedure: Lower Extremity Angiography;  Surgeon: Algernon Huxley, MD;  Location: [facility];  Service: Cardiovascular;  Laterality: Left;  . TOTAL SHOULDER ARTHROPLASTY  Right 07/24/2019   Procedure: RIGHT TOTAL SHOULDER ARTHROPLASTY;  Surgeon: Lovell Sheehan, MD;  Location: [facility] ORS;  Service: Orthopedics;  Laterality: Right;   Family History  Problem Relation Age of Onset  . Hyperlipidemia Mother   . Hyperlipidemia Father   . Melanoma Sister   . Breast cancer Neg Hx    Social History   Socioeconomic History  . Marital status: Widowed    Spouse name: Not on file  . Number of children: 0  . Years of education: 8  . Highest education level: Not on file  Occupational History  . Occupation: disabled  Tobacco Use  . Smoking status: Current Every Day Smoker    Packs/day: 2.00    Years: 48.00    Pack years: 96.00    Types: Cigarettes  . Smokeless tobacco: Never Used  Vaping Use  . Vaping Use: Never used  Substance and Sexual Activity  . Alcohol use: No  . Drug use: No  . Sexual activity:  Not on file  Other Topics Concern  . Not on file  Social History Narrative  . Not on file   Social Determinants of Health   Financial Resource Strain: Low Risk   . Difficulty of Paying Living Expenses: Not hard at all  Food Insecurity: No Food Insecurity  . Worried About Charity fundraiser in the Last Year: Never true  . Ran Out of Food in the Last Year: Never true  Transportation Needs: No Transportation Needs  . Lack of Transportation (Medical): No  . Lack of Transportation (Non-Medical): No  Physical Activity: Inactive  . Days of Exercise per Week: 0 days  . Minutes of Exercise per Session: 0 min  Stress: No Stress Concern Present  . Feeling of Stress : Not at all  Social Connections: Not on file    Tobacco Counseling Ready to quit: Not Answered Counseling given: Not Answered   Clinical Intake:  Pre-visit preparation completed: Yes  Pain : No/denies pain     Nutritional Status: BMI > 30  Obese Nutritional Risks: Nausea/ vomitting/ diarrhea (happens often) Diabetes: Yes  How often do you need to have someone help you when you read instructions, pamphlets, or other written materials from your doctor or pharmacy?: 1 - Never What is the last grade level you completed in school?: 9th grade  Diabetic? Yes Nutrition Risk Assessment:  Has the patient had any N/V/D within the last 2 months?  Yes  Does the patient have any non-healing wounds?  No  Has the patient had any unintentional weight loss or weight gain?  No   Diabetes:  Is the patient diabetic?  Yes  If diabetic, was a CBG obtained today?  No  Did the patient bring in their glucometer from home?  No  How often do you monitor your CBG's? daily.   Financial Strains and Diabetes Management:  Are you having any financial strains with the device, your supplies or your medication? No .  Does the patient want to be seen by Chronic Care Management for management of their diabetes?  No  Would the patient like  to be referred to a Nutritionist or for Diabetic Management?  No   Diabetic Exams:  Diabetic Eye Exam: Completed 06/11/2019 Diabetic Foot Exam: Completed 05/13/2020   Interpreter Needed?: No  Information entered by :: NAllen LPN   Activities of Daily Living In your present state of health, do you have any difficulty performing the following activities: 05/15/2020 01/07/2020  Hearing? N  N  Vision? N N  Difficulty concentrating or making decisions? N N  Walking or climbing stairs? N N  Dressing or bathing? N N  Comment - -  Doing errands, shopping? N N  Preparing Food and eating ? N -  Using the Toilet? N -  In the past six months, have you accidently leaked urine? N -  Do you have problems with loss of bowel control? N -  Managing your Medications? N -  Managing your Finances? N -  Housekeeping or managing your Housekeeping? N -  Some recent data might be hidden    Patient Care Team: Venita Lick, NP as PCP - General (Nurse Practitioner) Kate Sable, MD as PCP - Cardiology (Cardiology) Guadalupe Maple, MD (Family Medicine)  Indicate any recent Medical Services you may have received from other than [facility] providers in the past year (date may be approximate).     Assessment:   This is a routine wellness examination for [place].  Hearing/Vision screen  Hearing Screening   _0  _1  _2  _3  _4  _5  _6  _7  _8   Right ear:           Left ear:           Vision Screening Comments: Regular eye exams, [facility]  Dietary issues and exercise activities discussed: Current Exercise Habits: The patient does not participate in regular exercise at present  Goals    .  Patient Stated      05/15/2020, no goals    .  PharmD "I want to get my A1c down" (pt-stated)      Current Barriers:  . Diabetes: uncontrolled but improved, most recent A1c 7.0% . Current antihyperglycemic regimen: metformin 500 mg BID, Ozempic 0.5 mg once weekly . Current blood  glucose readings: Fasting <130, post prandial <180 generally . Current meal patterns: o Has cut back on sodas; previously was drinking ~3 per day, now at 1/2 per day  . Current exercise:  o Walking ~1 mile daily; previously was waking 2 miles a day  o [place] and was able to walk much further than she previously could  . Cardiovascular risk reduction: o Current hypertensive regimen: lisinopril 10 mg daily, BP generally well controlled <130/80 at recent appointments o Current hyperlipidemia regimen: atorvastatin 20 mg; LDL at goal <100  Pharmacist Clinical Goal(s):  Marland Kitchen Over the next 90 days, patient with work with PharmD and primary care provider to address optimized medication management  Interventions: . Congratulated patient on maintenance of goal BG.  . Reviewed goal A1c, fasting glucose, and 2 hour post prandial glucose. Provided this in writing  Patient Self Care Activities:  . Patient will check blood glucose regularly , document, and provide at future appointments . Patient will take medications as prescribed . Patient will report any questions or concerns to provider   Please see past updates related to this goal by clicking on the "Past Updates" button in the selected goal      .  PharmD "My breathing is bad right now" (pt-stated)      Current Barriers:  . Tobacco abuse since age 70; currently smoking 1/2 ppd . Previous quit attempt for ~2 months with husband, but quickly relapsed. Has been on Chantix therapy, which helped her reduce how much she is smoking. However, increased irritability led to Chantix + fluoxetine being discontinued, and bupropion being started a few weeks ago. Reports lessened irritability . Is interested in using nicotine replacement  therapy   Pharmacist Clinical Goal(s):  Marland Kitchen Over the next 60 days, patient will work with PharmD and provider towards tobacco cessation  Interventions: . Comprehensive medication review performed,  medication list in electronic medical record updated . Recommend NRT patches + gum/lozenges in combination with bupropion. Patient notes that she cannot afford them if not covered by insurance. Recommended that she contact the [place] (1-800-QUIT-NOW). Patient will outreach this group for support.  Patient Self Care Activities:  . Patient will commit to reducing tobacco consumption  Initial goal documentation      .  Quit smoking / using tobacco      Smoking cessation discussed       Depression Screen PHQ 2/9 Scores 05/15/2020 05/13/2020 01/07/2020 01/07/2020 08/26/2019 05/15/2019 05/14/2019  PHQ - 2 Score 0 3 0 0 0 0 0  PHQ- 9 Score - 14 4 - 2 - 3  Exception Documentation - - - - - - -    Fall Risk Fall Risk  05/15/2020 05/13/2020 01/07/2020 05/15/2019 07/13/2018  Falls in the past year? 1 0 0 0 0  Number falls in past yr: - 0 0 0 -  Injury with Fall? - - 0 0 -  Risk for fall due to : Other (Comment);Medication side effect - No Fall Risks - -  Risk for fall due to: Comment - - - - -  Follow up Falls evaluation completed;Education provided;Falls prevention discussed - Falls evaluation completed - Falls evaluation completed    FALL RISK PREVENTION PERTAINING TO THE HOME:  Any stairs in or around the home? Yes   If so, are there any without handrails? No  Home free of loose throw rugs in walkways, pet beds, electrical cords, etc? Yes  Adequate lighting in your home to reduce risk of falls? Yes   ASSISTIVE DEVICES UTILIZED TO PREVENT FALLS:  Life alert? No  Use of a cane, walker or w/c? No  Grab bars in the bathroom? No  Shower chair or bench in shower? No  Elevated toilet seat or a handicapped toilet? No   TIMED UP AND GO:  Was the test performed? No .  Cognitive Function:     6CIT Screen 05/15/2020 02/01/2018 01/25/2017  What Year? 0 points 0 points 0 points  What month? 0 points 0 points 0 points  What time? 0 points 0 points 0 points  Count back from 20 0 points 0  points 0 points  Months in reverse 4 points 0 points 0 points  Repeat phrase 6 points 0 points 0 points  Total Score 10 0 0    Immunizations Immunization History  Administered Date(s) Administered  . Influenza,inj,Quad PF,6+ Mos 02/09/2016, 02/22/2017, 05/09/2018, 12/17/2018, 01/07/2020  . Pneumococcal Polysaccharide-23 02/09/2016  . Td 04/04/2004, 05/13/2014  . Zoster Recombinat (Shingrix) 01/07/2019, 04/22/2019    TDAP status: Up to date  Flu Vaccine status: Up to date  Pneumococcal vaccine status: Up to date  Covid-19 vaccine status: Declined, Education has been provided regarding the importance of this vaccine but patient still declined. Advised may receive this vaccine at local pharmacy or Health Dept.or vaccine clinic. Aware to provide a copy of the vaccination record if obtained from local pharmacy or Health Dept. Verbalized acceptance and understanding.  Qualifies for Shingles Vaccine? Yes   Zostavax completed No   Shingrix Completed?: Yes  Screening Tests Health Maintenance  Topic Date Due  . MAMMOGRAM  03/26/2020  . COVID-19 Vaccine (1) 05/29/2020 (Originally 05/06/1972)  . OPHTHALMOLOGY EXAM  06/10/2020  . HEMOGLOBIN A1C  11/10/2020  . COLONOSCOPY (Pts 45-86yr Insurance coverage will need to be confirmed)  04/24/2021  . FOOT EXAM  05/13/2021  . TETANUS/TDAP  05/13/2024  . INFLUENZA VACCINE  Completed  . PNEUMOCOCCAL POLYSACCHARIDE VACCINE AGE 70-64 HIGH RISK  Completed  . Hepatitis C Screening  Completed  . HIV Screening  Completed    Health Maintenance  Health Maintenance Due  Topic Date Due  . MAMMOGRAM  03/26/2020    Colorectal cancer screening: Type of screening: Colonoscopy. Completed 04/24/2018. Repeat every 3 years  Mammogram status: Completed 03/26/2018. Repeat every year  Bone Density status: Completed 11/07/2013.   Lung Cancer Screening: (Low Dose CT Chest recommended if Age 62-80years, 30 pack-year currently smoking OR have quit w/in 15years.)  does qualify.   Lung Cancer Screening Referral: completed 05/30/2019  Additional Screening:  Hepatitis C Screening: does qualify; Completed 02/11/2019  Vision Screening: Recommended annual ophthalmology exams for early detection of glaucoma and other disorders of the eye. Is the patient up to date with their annual eye exam?  Yes  Who is the provider or what is the name of the [facility] which the patient [facility]ends annual [facility] exams[facility] not [facility] a provider, would they like to be referred to a provider to establish care? No .   Dental Screening: Recommended annual dental exams for proper oral hygiene  Community Resource Referral / Chronic Care Management: CRR required this visit?  No   CCM required this visit?  No      Plan:     I have personally reviewed and noted the following in the patient's chart:   . Medical and social history . Use of alcohol, tobacco or illicit drugs  . Current medications and supplements . Functional ability and status . Nutritional status . Physical activity . Advanced directives . List of other physicians . Hospitalizations, surgeries, and ER visits in previous 12 months . Vitals . Screenings to include cognitive, depression, and falls . Referrals and appointments  In addition, I have reviewed and discussed with patient certain preventive protocols, quality metrics, and best practice recommendations. A written personalized care plan for preventive services as well as general preventive health recommendations were provided to patient.     NKellie Simmering LPN   [id number]  Nurse Notes:

## 2020-05-16 NOTE — Telephone Encounter (Signed)
Yes can send in 1 rf (written as 3 mo supply) hydroxyzine

## 2020-05-18 MED ORDER — HYDROXYZINE HCL 25 MG PO TABS: 25.0000 mg | ORAL_TABLET | ORAL | 0 refills | Status: DC

## 2020-05-18 NOTE — Telephone Encounter (Signed)
Hydroxyzine RF sent in.

## 2020-05-23 ENCOUNTER — Encounter: Payer: Self-pay | Admitting: Nurse Practitioner

## 2020-05-23 DIAGNOSIS — F4321 Adjustment disorder with depressed mood: Secondary | ICD-10-CM | POA: Insufficient documentation

## 2020-05-27 ENCOUNTER — Other Ambulatory Visit: Payer: Self-pay | Admitting: *Deleted

## 2020-05-27 DIAGNOSIS — F17200 Nicotine dependence, unspecified, uncomplicated: Secondary | ICD-10-CM

## 2020-05-27 DIAGNOSIS — Z122 Encounter for screening for malignant neoplasm of respiratory organs: Secondary | ICD-10-CM

## 2020-05-27 DIAGNOSIS — Z87891 Personal history of nicotine dependence: Secondary | ICD-10-CM

## 2020-05-27 NOTE — Progress Notes (Signed)
Contacted and scheduled for annual lung screening scan. Patient is a current smoker with a 98 pack year history.

## 2020-05-28 ENCOUNTER — Ambulatory Visit: Payer: Medicare Other | Admitting: Nurse Practitioner

## 2020-06-05 ENCOUNTER — Other Ambulatory Visit: Payer: Self-pay

## 2020-06-05 ENCOUNTER — Ambulatory Visit
Admission: RE | Admit: 2020-06-05 | Discharge: 2020-06-05 | Disposition: A | Discharge status: Home / Self Care | Payer: Medicare Other | Source: Ambulatory Visit | Attending: Nurse Practitioner | Admitting: Nurse Practitioner

## 2020-06-05 DIAGNOSIS — F17210 Nicotine dependence, cigarettes, uncomplicated: Secondary | ICD-10-CM | POA: Diagnosis not present

## 2020-06-05 DIAGNOSIS — F17200 Nicotine dependence, unspecified, uncomplicated: Secondary | ICD-10-CM | POA: Insufficient documentation

## 2020-06-05 DIAGNOSIS — Z122 Encounter for screening for malignant neoplasm of respiratory organs: Secondary | ICD-10-CM | POA: Insufficient documentation

## 2020-06-05 DIAGNOSIS — Z87891 Personal history of nicotine dependence: Secondary | ICD-10-CM | POA: Diagnosis not present

## 2020-06-10 DIAGNOSIS — H35013 Changes in retinal vascular appearance, bilateral: Secondary | ICD-10-CM | POA: Diagnosis not present

## 2020-06-10 DIAGNOSIS — H2513 Age-related nuclear cataract, bilateral: Secondary | ICD-10-CM | POA: Diagnosis not present

## 2020-06-10 DIAGNOSIS — E119 Type 2 diabetes mellitus without complications: Secondary | ICD-10-CM | POA: Diagnosis not present

## 2020-06-10 DIAGNOSIS — H353132 Nonexudative age-related macular degeneration, bilateral, intermediate dry stage: Secondary | ICD-10-CM | POA: Diagnosis not present

## 2020-06-10 LAB — HM DIABETES EYE EXAM

## 2020-06-12 ENCOUNTER — Encounter: Payer: Self-pay | Admitting: *Deleted

## 2020-06-24 ENCOUNTER — Other Ambulatory Visit: Payer: Self-pay | Admitting: Nurse Practitioner

## 2020-06-30 ENCOUNTER — Telehealth: Payer: Self-pay | Admitting: Nurse Practitioner

## 2020-06-30 NOTE — Telephone Encounter (Signed)
Yes, would need visit since last treatment was one month ago.

## 2020-06-30 NOTE — Telephone Encounter (Signed)
Called pt to schedule she said she will call back to make one after she checks her appointments

## 2020-06-30 NOTE — Telephone Encounter (Signed)
Pt called and is requesting to have an antibiotic sent in for her. She states that she has another ear infection and that she was seen on 05/13/20 for the same issue. Pt is also requesting to have PCP send over a  Medication for a yeast infection. Please advise.       [facility] [address] [place], [place] - [place] [address]  Phone: [phone] Fax: [phone]  Hours: Not open 24 hours

## 2020-07-21 ENCOUNTER — Encounter: Payer: Self-pay | Admitting: Nurse Practitioner

## 2020-07-21 ENCOUNTER — Ambulatory Visit (INDEPENDENT_AMBULATORY_CARE_PROVIDER_SITE_OTHER): Payer: Medicare Other | Admitting: Nurse Practitioner

## 2020-07-21 VITALS — Temp 101.3°F

## 2020-07-21 DIAGNOSIS — J441 Chronic obstructive pulmonary disease with (acute) exacerbation: Secondary | ICD-10-CM

## 2020-07-21 MED ORDER — HYDROCOD POLST-CPM POLST ER 10-8 MG/5ML PO SUER: 5.0000 mL | Freq: Two times a day (BID) | ORAL | 0 refills | Status: DC | PRN

## 2020-07-21 MED ORDER — BENZONATATE 200 MG PO CAPS: 200.0000 mg | ORAL_CAPSULE | Freq: Two times a day (BID) | ORAL | 0 refills | Status: DC | PRN

## 2020-07-21 MED ORDER — PREDNISONE 20 MG PO TABS: 40.0000 mg | ORAL_TABLET | Freq: Every day | ORAL | 0 refills | Status: DC

## 2020-07-21 MED ORDER — AZITHROMYCIN 250 MG PO TABS: ORAL_TABLET | ORAL | 0 refills | Status: DC

## 2020-07-21 NOTE — Progress Notes (Signed)
Temp (!) 101.3 F (38.5 C)    Subjective:    Patient ID: Stacey Gross, female    DOB: [birth year], 60 y.o.   MRN: [id number]  HPI: Stacey Gross is a 60 y.o. female  Chief Complaint  Patient presents with  . Sinus Problem    Patient states she is having decreased appetite, sinus pressure, diarrhea, ear drainage and fever of 101.4. Patient states she has took an at-home COVID it was negative. Patient states her symptoms started yesterday.  . Chills  . Nasal Congestion    . This visit was completed via telephone due to the restrictions of the COVID-19 pandemic. All issues as above were discussed and addressed but no physical exam was performed. If it was felt that the patient should be evaluated in the office, they were directed there. The patient verbally consented to this visit. Patient was unable to complete an audio/visual visit due to Lack of equipment. Due to the catastrophic nature of the COVID-19 pandemic, this visit was done through audio contact only. . Location of the patient: home . Location of the provider: home . Those involved with this call:  . Provider: Marnee Guarneri, DNP . CMA: Irena Reichmann, CMA . Front Desk/Registration: Jill Side  . Time spent on call: 21 minutes on the phone discussing health concerns. 15 minutes total spent in review of patient's record and preparation of their chart.  . I verified patient identity using two factors (patient name and date of birth). Patient consents verbally to being seen via telemedicine visit today.    UPPER RESPIRATORY TRACT INFECTION Symptoms started yesterday, congestion and coughing.  Has underlying COPD -- did home Covid test and this was negative.  Is not vaccinated.  Is using Albuterol more frequently at this time during acute phase, this helps. Continues on [place] daily.  Fever: yes Cough: yes Shortness of breath: no Wheezing: yes Chest pain: no Chest tightness: no Chest congestion: no Nasal  congestion: yes Runny nose: yes Post nasal drip: yes Sneezing: yes Sore throat: yes Swollen glands: none Sinus pressure: yes Headache: yes Face pain: no Toothache: no Ear pain: none Ear pressure: yes bilateral Eyes red/itching:no Eye drainage/crusting: no  Vomiting: yes and diarrhea presented yesterday with coughing episodes -- came on all at once  Rash: no Fatigue: yes Sick contacts: no Strep contacts: no  Context: fluctuating Recurrent sinusitis: no Relief with OTC cold/cough medications: no  Treatments attempted: mucinex   Relevant past medical, surgical, family and social history reviewed and updated as indicated. Interim medical history since our last visit reviewed. Allergies and medications reviewed and updated.  Review of Systems  Constitutional: Positive for appetite change, fatigue and fever. Negative for activity change.  HENT: Positive for congestion, postnasal drip, rhinorrhea, sinus pressure, sneezing and sore throat. Negative for ear discharge, ear pain, facial swelling, sinus pain and voice change.   Eyes: Negative for pain and visual disturbance.  Respiratory: Positive for cough, chest tightness and wheezing. Negative for shortness of breath.   Cardiovascular: Negative for chest pain, palpitations and leg swelling.  Gastrointestinal: Positive for diarrhea, nausea and vomiting. Negative for abdominal distention, abdominal pain and constipation.  Endocrine: Negative.   Musculoskeletal: Positive for myalgias.  Neurological: Positive for headaches. Negative for dizziness and numbness.  Psychiatric/Behavioral: Negative.     Per HPI unless specifically indicated above     Objective:    Temp (!) 101.3 F (38.5 C)   Wt Readings from Last 3 Encounters:  06/05/20 211  lb (95.7 kg)  05/15/20 220 lb (99.8 kg)  05/13/20 220 lb 3.2 oz (99.9 kg)    Physical Exam   Unable to perform due to telephone visit only.  No SOB noted with talking or audible  wheezing.  Results for orders placed or performed in visit on 06/10/20  HM DIABETES EYE EXAM  Result Value Ref Range   HM Diabetic Eye Exam No Retinopathy No Retinopathy      Assessment & Plan:   Problem List Items Addressed This Visit      Respiratory   COPD exacerbation ([place]) - Primary    Acute for 24 hours with GI symptoms as well presenting with increased cough, last one in July 2021.  Will obtain PCR Covid testing at office, discussed with patient to self quarantine at this time until symptoms have improved.  Scripts for Prednisone and Azithromycin sent.  She is aware Prednisone may increase BS for short period and to monitor with her diabetes.  Will also send in Tessalon and Tussionex patient may used as needed.  Does not wish to obtain imaging at this time, but will obtain if no improvement or worsening -- recommend continue use of albuterol nebulizer as ordered until symptoms improved.  Consider pulmonary referral if worsening COPD or frequent exacerbation.  Return in one week for follow-up.      Relevant Medications   azithromycin (ZITHROMAX) 250 MG tablet   predniSONE (DELTASONE) 20 MG tablet   chlorpheniramine-HYDROcodone (TUSSIONEX PENNKINETIC ER) 10-8 MG/5ML SUER   benzonatate (TESSALON) 200 MG capsule   Other Relevant Orders   Novel Coronavirus, NAA ([facility])      I discussed the assessment and treatment plan with the patient. The patient was provided an opportunity to ask questions and all were answered. The patient agreed with the plan and demonstrated an understanding of the instructions.   The patient was advised to call back or seek an in-person evaluation if the symptoms worsen or if the condition fails to improve as anticipated.   I provided 21+ minutes of time during this encounter.  Follow up plan: Return in about 1 week (around 07/28/2020) for COPD exacerbation follow-up -- in office to listen to lungs.

## 2020-07-21 NOTE — Assessment & Plan Note (Signed)
Acute for 24 hours with GI symptoms as well presenting with increased cough, last one in July 2021.  Will obtain PCR Covid testing at office, discussed with patient to self quarantine at this time until symptoms have improved.  Scripts for Prednisone and Azithromycin sent.  She is aware Prednisone may increase BS for short period and to monitor with her diabetes.  Will also send in Tessalon and Tussionex patient may used as needed.  Does not wish to obtain imaging at this time, but will obtain if no improvement or worsening -- recommend continue use of albuterol nebulizer as ordered until symptoms improved.  Consider pulmonary referral if worsening COPD or frequent exacerbation.  Return in one week for follow-up.

## 2020-07-21 NOTE — Patient Instructions (Signed)

## 2020-07-22 ENCOUNTER — Other Ambulatory Visit: Payer: Self-pay | Admitting: Nurse Practitioner

## 2020-07-22 ENCOUNTER — Other Ambulatory Visit: Payer: Medicare Other

## 2020-07-22 NOTE — Telephone Encounter (Signed)
Scheduled for today.

## 2020-07-22 NOTE — Telephone Encounter (Signed)
Notes to clinic:  Patient has appointment today    Requested Prescriptions  Pending Prescriptions Disp Refills   OZEMPIC, 0.25 OR 0.5 MG/DOSE, 2 MG/1.5ML SOPN [Pharmacy Med Name: OZEMPIC (0.25 OR 0.5 MG/DOSE) 2 MG/] 4.5 mL     Sig: Inject 0.5 mg into the skin once a week.      Endocrinology:  Diabetes - GLP-1 Receptor Agonists Passed - 07/22/2020 10:01 AM      Passed - HBA1C is between 0 and 7.9 and within 180 days    HB A1C (BAYER DCA - WAIVED)  Date Value Ref Range Status  05/13/2020 6.7 <7.0 % Final    Comment:                                          Diabetic Adult            <7.0                                       Healthy Adult        4.3 - 5.7                                                           (DCCT/NGSP) American Diabetes Association's Summary of Glycemic Recommendations for Adults with Diabetes: Hemoglobin A1c <7.0%. More stringent glycemic goals (A1c <6.0%) may further reduce complications at the cost of increased risk of hypoglycemia.           Passed - Valid encounter within last 6 months    Recent Outpatient Visits           Yesterday COPD exacerbation ([place])   [place], Somis T, NP   2 months ago Type 2 diabetes, controlled, with peripheral circulatory disorder ([place])   [place], Jolene T, NP   6 months ago Hypertension associated with diabetes ([place])   [place], Jolene T, NP   9 months ago Type 2 diabetes, controlled, with peripheral circulatory disorder ([place])   [place] Venita Lick, NP   11 months ago [place], Lilia Argue, PA-C       Future Appointments             In 1 week Cannady, Barbaraann Faster, NP [facility]   In 9 months  [facility]               albuterol (VENTOLIN HFA) 108 (90 Base) MCG/ACT inhaler [Pharmacy Med Name: ALBUTEROL SULFATE HFA 108 (90 BASE)] 8.5 g 5    Sig:  INHALE 2 PUFFS BY MOUTH INTO THE LUNGS EVERY 6 HOURS AS NEEDED FOR WHEEZING OR SHORTNESS OF BREATH      Pulmonology:  Beta Agonists Failed - 07/22/2020 10:01 AM      Failed - One inhaler should last at least one month. If the patient is requesting refills earlier, contact the patient to check for uncontrolled symptoms.      Passed - Valid encounter within last 12 months    Recent Outpatient Visits  Yesterday COPD exacerbation ([place])   [place], [place] T, NP   2 months ago Type 2 diabetes, controlled, with peripheral circulatory disorder ([place])   [place], [place] T, NP   6 months ago Hypertension associated with diabetes ([place])   [place], Jolene T, NP   9 months ago Type 2 diabetes, controlled, with peripheral circulatory disorder [facility])   [place] Venita Lick, NP   11 months ago [place], Lilia Argue, [place]       Future Appointments             In 1 week Cannady, Barbaraann Faster, NP [facility]   In 9 months  [facility]

## 2020-07-25 ENCOUNTER — Other Ambulatory Visit: Payer: Self-pay

## 2020-07-25 ENCOUNTER — Emergency Department: Payer: Medicare Other

## 2020-07-25 ENCOUNTER — Inpatient Hospital Stay
Admission: EM | Admit: 2020-07-25 | Discharge: 2020-07-28 | DRG: 190 | Disposition: A | Discharge status: Home / Self Care | Payer: Medicare Other | Attending: Internal Medicine | Admitting: Internal Medicine

## 2020-07-25 DIAGNOSIS — Z83438 Family history of other disorder of lipoprotein metabolism and other lipidemia: Secondary | ICD-10-CM | POA: Diagnosis not present

## 2020-07-25 DIAGNOSIS — A419 Sepsis, unspecified organism: Secondary | ICD-10-CM | POA: Diagnosis not present

## 2020-07-25 DIAGNOSIS — E1165 Type 2 diabetes mellitus with hyperglycemia: Secondary | ICD-10-CM | POA: Diagnosis not present

## 2020-07-25 DIAGNOSIS — I152 Hypertension secondary to endocrine disorders: Secondary | ICD-10-CM | POA: Diagnosis present

## 2020-07-25 DIAGNOSIS — Z720 Tobacco use: Secondary | ICD-10-CM | POA: Diagnosis present

## 2020-07-25 DIAGNOSIS — J9601 Acute respiratory failure with hypoxia: Secondary | ICD-10-CM | POA: Diagnosis present

## 2020-07-25 DIAGNOSIS — J441 Chronic obstructive pulmonary disease with (acute) exacerbation: Secondary | ICD-10-CM | POA: Diagnosis not present

## 2020-07-25 DIAGNOSIS — K219 Gastro-esophageal reflux disease without esophagitis: Secondary | ICD-10-CM | POA: Diagnosis present

## 2020-07-25 DIAGNOSIS — I739 Peripheral vascular disease, unspecified: Secondary | ICD-10-CM | POA: Diagnosis not present

## 2020-07-25 DIAGNOSIS — E785 Hyperlipidemia, unspecified: Secondary | ICD-10-CM | POA: Diagnosis present

## 2020-07-25 DIAGNOSIS — R079 Chest pain, unspecified: Secondary | ICD-10-CM | POA: Diagnosis not present

## 2020-07-25 DIAGNOSIS — Z881 Allergy status to other antibiotic agents status: Secondary | ICD-10-CM

## 2020-07-25 DIAGNOSIS — E1169 Type 2 diabetes mellitus with other specified complication: Secondary | ICD-10-CM | POA: Diagnosis present

## 2020-07-25 DIAGNOSIS — R6520 Severe sepsis without septic shock: Secondary | ICD-10-CM | POA: Diagnosis not present

## 2020-07-25 DIAGNOSIS — Z28310 Unvaccinated for covid-19: Secondary | ICD-10-CM | POA: Diagnosis not present

## 2020-07-25 DIAGNOSIS — E1151 Type 2 diabetes mellitus with diabetic peripheral angiopathy without gangrene: Secondary | ICD-10-CM | POA: Diagnosis present

## 2020-07-25 DIAGNOSIS — Z96611 Presence of right artificial shoulder joint: Secondary | ICD-10-CM | POA: Diagnosis present

## 2020-07-25 DIAGNOSIS — F419 Anxiety disorder, unspecified: Secondary | ICD-10-CM | POA: Diagnosis present

## 2020-07-25 DIAGNOSIS — Z85028 Personal history of other malignant neoplasm of stomach: Secondary | ICD-10-CM

## 2020-07-25 DIAGNOSIS — E1159 Type 2 diabetes mellitus with other circulatory complications: Secondary | ICD-10-CM | POA: Diagnosis not present

## 2020-07-25 DIAGNOSIS — G8929 Other chronic pain: Secondary | ICD-10-CM | POA: Diagnosis present

## 2020-07-25 DIAGNOSIS — M549 Dorsalgia, unspecified: Secondary | ICD-10-CM | POA: Diagnosis present

## 2020-07-25 DIAGNOSIS — Z791 Long term (current) use of non-steroidal anti-inflammatories (NSAID): Secondary | ICD-10-CM | POA: Diagnosis not present

## 2020-07-25 DIAGNOSIS — Z20822 Contact with and (suspected) exposure to covid-19: Secondary | ICD-10-CM | POA: Diagnosis not present

## 2020-07-25 DIAGNOSIS — F17210 Nicotine dependence, cigarettes, uncomplicated: Secondary | ICD-10-CM | POA: Diagnosis not present

## 2020-07-25 DIAGNOSIS — T380X5A Adverse effect of glucocorticoids and synthetic analogues, initial encounter: Secondary | ICD-10-CM | POA: Diagnosis present

## 2020-07-25 DIAGNOSIS — R000 Tachycardia, unspecified: Secondary | ICD-10-CM | POA: Diagnosis not present

## 2020-07-25 DIAGNOSIS — Z7984 Long term (current) use of oral hypoglycemic drugs: Secondary | ICD-10-CM

## 2020-07-25 DIAGNOSIS — Z886 Allergy status to analgesic agent status: Secondary | ICD-10-CM

## 2020-07-25 DIAGNOSIS — Z91040 Latex allergy status: Secondary | ICD-10-CM

## 2020-07-25 DIAGNOSIS — Z7952 Long term (current) use of systemic steroids: Secondary | ICD-10-CM | POA: Diagnosis not present

## 2020-07-25 DIAGNOSIS — Z79899 Other long term (current) drug therapy: Secondary | ICD-10-CM

## 2020-07-25 DIAGNOSIS — Z808 Family history of malignant neoplasm of other organs or systems: Secondary | ICD-10-CM

## 2020-07-25 DIAGNOSIS — R0602 Shortness of breath: Secondary | ICD-10-CM | POA: Diagnosis not present

## 2020-07-25 DIAGNOSIS — J439 Emphysema, unspecified: Secondary | ICD-10-CM | POA: Diagnosis not present

## 2020-07-25 DIAGNOSIS — F32A Depression, unspecified: Secondary | ICD-10-CM | POA: Diagnosis not present

## 2020-07-25 LAB — CBC WITH DIFFERENTIAL/PLATELET
Abs Immature Granulocytes: 0.26 10*3/uL — ABNORMAL HIGH (ref 0.00–0.07)
Basophils Absolute: 0.1 10*3/uL (ref 0.0–0.1)
Basophils Relative: 1 %
Eosinophils Absolute: 0 10*3/uL (ref 0.0–0.5)
Eosinophils Relative: 0 %
HCT: 40.4 % (ref 36.0–46.0)
Hemoglobin: 13.6 g/dL (ref 12.0–15.0)
Immature Granulocytes: 1 %
Lymphocytes Relative: 13 %
Lymphs Abs: 2.6 10*3/uL (ref 0.7–4.0)
MCH: 30.6 pg (ref 26.0–34.0)
MCHC: 33.7 g/dL (ref 30.0–36.0)
MCV: 91 fL (ref 80.0–100.0)
Monocytes Absolute: 1.3 10*3/uL — ABNORMAL HIGH (ref 0.1–1.0)
Monocytes Relative: 7 %
Neutro Abs: 15.4 10*3/uL — ABNORMAL HIGH (ref 1.7–7.7)
Neutrophils Relative %: 78 %
Platelets: 313 10*3/uL (ref 150–400)
RBC: 4.44 MIL/uL (ref 3.87–5.11)
RDW: 12.7 % (ref 11.5–15.5)
WBC: 19.8 10*3/uL — ABNORMAL HIGH (ref 4.0–10.5)
nRBC: 0 % (ref 0.0–0.2)

## 2020-07-25 LAB — COMPREHENSIVE METABOLIC PANEL
ALT: 19 U/L (ref 0–44)
AST: 26 U/L (ref 15–41)
Albumin: 3.7 g/dL (ref 3.5–5.0)
Alkaline Phosphatase: 85 U/L (ref 38–126)
Anion gap: 14 (ref 5–15)
BUN: 15 mg/dL (ref 8–23)
CO2: 23 mmol/L (ref 22–32)
Calcium: 9 mg/dL (ref 8.9–10.3)
Chloride: 95 mmol/L — ABNORMAL LOW (ref 98–111)
Creatinine, Ser: 1.04 mg/dL — ABNORMAL HIGH (ref 0.44–1.00)
GFR, Estimated: >60 mL/min (ref >60)
Glucose, Bld: 325 mg/dL — ABNORMAL HIGH (ref 70–99)
Potassium: 3.8 mmol/L (ref 3.5–5.1)
Sodium: 132 mmol/L — ABNORMAL LOW (ref 135–145)
Total Bilirubin: 0.8 mg/dL (ref 0.3–1.2)
Total Protein: 7.4 g/dL (ref 6.5–8.1)

## 2020-07-25 LAB — PROCALCITONIN: Procalcitonin: <0.1 ng/mL

## 2020-07-25 LAB — HIV ANTIBODY (ROUTINE TESTING W REFLEX): HIV Screen 4th Generation wRfx: NONREACTIVE

## 2020-07-25 LAB — TROPONIN I (HIGH SENSITIVITY): Troponin I (High Sensitivity): 7 ng/L (ref <18)

## 2020-07-25 LAB — BRAIN NATRIURETIC PEPTIDE: B Natriuretic Peptide: 120.3 pg/mL — ABNORMAL HIGH (ref 0.0–100.0)

## 2020-07-25 LAB — LACTIC ACID, PLASMA
Lactic Acid, Venous: 2 mmol/L (ref 0.5–1.9)
Lactic Acid, Venous: 1.9 mmol/L (ref 0.5–1.9)

## 2020-07-25 LAB — GLUCOSE, CAPILLARY
Glucose-Capillary: 418 mg/dL — ABNORMAL HIGH (ref 70–99)
Glucose-Capillary: 276 mg/dL — ABNORMAL HIGH (ref 70–99)
Glucose-Capillary: 404 mg/dL — ABNORMAL HIGH (ref 70–99)

## 2020-07-25 LAB — RESP PANEL BY RT-PCR (FLU A&B, COVID) ARPGX2
Influenza A by PCR: NEGATIVE
Influenza B by PCR: NEGATIVE
SARS Coronavirus 2 by RT PCR: NEGATIVE

## 2020-07-25 LAB — PROTIME-INR
INR: 1.1 (ref 0.8–1.2)
Prothrombin Time: 14.1 seconds (ref 11.4–15.2)

## 2020-07-25 LAB — CBG MONITORING, ED: Glucose-Capillary: 285 mg/dL — ABNORMAL HIGH (ref 70–99)

## 2020-07-25 MED ORDER — LOSARTAN POTASSIUM 25 MG PO TABS
25.0000 mg | ORAL_TABLET | Freq: Every evening | ORAL | Status: DC
  Administered 2020-07-25 – 2020-07-27 (×3): 25 mg via ORAL
  Filled 2020-07-25 (×3): qty 1

## 2020-07-25 MED ORDER — LACTATED RINGERS IV BOLUS (SEPSIS)
1000.0000 mL | Freq: Once | INTRAVENOUS | Status: AC
  Administered 2020-07-25: 1000 mL via INTRAVENOUS

## 2020-07-25 MED ORDER — INSULIN ASPART 100 UNIT/ML ~~LOC~~ SOLN
0.0000 [IU] | Freq: Three times a day (TID) | SUBCUTANEOUS | Status: DC
  Administered 2020-07-25: 13:00:00 9 [IU] via SUBCUTANEOUS
  Administered 2020-07-25 (×2): 5 [IU] via SUBCUTANEOUS
  Filled 2020-07-25 (×3): qty 1

## 2020-07-25 MED ORDER — METHYLPREDNISOLONE SODIUM SUCC 40 MG IJ SOLR
40.0000 mg | Freq: Two times a day (BID) | INTRAMUSCULAR | Status: DC
  Administered 2020-07-25 – 2020-07-26 (×2): 40 mg via INTRAVENOUS
  Filled 2020-07-25 (×2): qty 1

## 2020-07-25 MED ORDER — HYDRALAZINE HCL 20 MG/ML IJ SOLN: 5.0000 mg | INTRAMUSCULAR | Status: DC | PRN

## 2020-07-25 MED ORDER — ALBUTEROL SULFATE (2.5 MG/3ML) 0.083% IN NEBU: 2.5000 mg | INHALATION_SOLUTION | RESPIRATORY_TRACT | Status: DC | PRN

## 2020-07-25 MED ORDER — METHYLPREDNISOLONE SODIUM SUCC 125 MG IJ SOLR
125.0000 mg | Freq: Once | INTRAMUSCULAR | Status: AC
  Administered 2020-07-25: 125 mg via INTRAVENOUS
  Filled 2020-07-25: qty 2

## 2020-07-25 MED ORDER — LACTATED RINGERS IV BOLUS
1000.0000 mL | Freq: Once | INTRAVENOUS | Status: AC
  Administered 2020-07-25: 1000 mL via INTRAVENOUS

## 2020-07-25 MED ORDER — CITALOPRAM HYDROBROMIDE 20 MG PO TABS
20.0000 mg | ORAL_TABLET | Freq: Every day | ORAL | Status: DC
  Administered 2020-07-26 – 2020-07-28 (×3): 20 mg via ORAL
  Filled 2020-07-25 (×4): qty 1

## 2020-07-25 MED ORDER — IPRATROPIUM-ALBUTEROL 0.5-2.5 (3) MG/3ML IN SOLN: 3.0000 mL | Freq: Once | RESPIRATORY_TRACT | Status: AC

## 2020-07-25 MED ORDER — BUDESON-GLYCOPYRROL-FORMOTEROL 160-9-4.8 MCG/ACT IN AERO: 2.0000 | INHALATION_SPRAY | Freq: Two times a day (BID) | RESPIRATORY_TRACT | Status: DC

## 2020-07-25 MED ORDER — SODIUM CHLORIDE 0.9 % IV SOLN
500.0000 mg | INTRAVENOUS | Status: DC
  Administered 2020-07-25 – 2020-07-28 (×4): 500 mg via INTRAVENOUS
  Filled 2020-07-25 (×4): qty 500

## 2020-07-25 MED ORDER — TRAZODONE HCL 50 MG PO TABS: 100.0000 mg | ORAL_TABLET | Freq: Every evening | ORAL | Status: DC | PRN

## 2020-07-25 MED ORDER — VITAMIN B-12 1000 MCG PO TABS
1000.0000 ug | ORAL_TABLET | Freq: Every day | ORAL | Status: DC
  Administered 2020-07-25 – 2020-07-28 (×4): 1000 ug via ORAL
  Filled 2020-07-25 (×4): qty 1

## 2020-07-25 MED ORDER — ENOXAPARIN SODIUM 40 MG/0.4ML ~~LOC~~ SOLN
40.0000 mg | SUBCUTANEOUS | Status: DC
  Administered 2020-07-25 – 2020-07-27 (×3): 40 mg via SUBCUTANEOUS
  Filled 2020-07-25 (×3): qty 0.4

## 2020-07-25 MED ORDER — INSULIN ASPART 100 UNIT/ML ~~LOC~~ SOLN: 0.0000 [IU] | Freq: Every day | SUBCUTANEOUS | Status: DC

## 2020-07-25 MED ORDER — CILOSTAZOL 100 MG PO TABS
100.0000 mg | ORAL_TABLET | Freq: Two times a day (BID) | ORAL | Status: DC
  Administered 2020-07-25 – 2020-07-28 (×6): 100 mg via ORAL
  Filled 2020-07-25 (×8): qty 1

## 2020-07-25 MED ORDER — HYDROXYZINE HCL 25 MG PO TABS
25.0000 mg | ORAL_TABLET | ORAL | Status: DC | PRN
Start: 2020-07-25 — End: 2020-07-28
  Filled 2020-07-25: qty 2

## 2020-07-25 MED ORDER — DM-GUAIFENESIN ER 30-600 MG PO TB12: 1.0000 | ORAL_TABLET | Freq: Two times a day (BID) | ORAL | Status: DC | PRN

## 2020-07-25 MED ORDER — SODIUM CHLORIDE 0.9 % IV SOLN
2.0000 g | INTRAVENOUS | Status: DC
  Administered 2020-07-25 – 2020-07-28 (×4): 2 g via INTRAVENOUS
  Filled 2020-07-25: qty 2
  Filled 2020-07-25 (×2): qty 20
  Filled 2020-07-25: qty 2

## 2020-07-25 MED ORDER — IPRATROPIUM-ALBUTEROL 0.5-2.5 (3) MG/3ML IN SOLN
3.0000 mL | RESPIRATORY_TRACT | Status: DC
  Administered 2020-07-25: 3 mL via RESPIRATORY_TRACT
  Filled 2020-07-25: qty 3

## 2020-07-25 MED ORDER — CYCLOBENZAPRINE HCL 10 MG PO TABS: 10.0000 mg | ORAL_TABLET | Freq: Every evening | ORAL | Status: DC | PRN

## 2020-07-25 MED ORDER — IPRATROPIUM-ALBUTEROL 0.5-2.5 (3) MG/3ML IN SOLN
3.0000 mL | Freq: Once | RESPIRATORY_TRACT | Status: AC
  Administered 2020-07-25: 3 mL via RESPIRATORY_TRACT

## 2020-07-25 MED ORDER — ROSUVASTATIN CALCIUM 20 MG PO TABS
20.0000 mg | ORAL_TABLET | Freq: Every day | ORAL | Status: DC
  Administered 2020-07-26 – 2020-07-28 (×3): 20 mg via ORAL
  Filled 2020-07-25 (×3): qty 1

## 2020-07-25 MED ORDER — PANTOPRAZOLE SODIUM 40 MG PO TBEC
40.0000 mg | DELAYED_RELEASE_TABLET | Freq: Every day | ORAL | Status: DC
  Administered 2020-07-25 – 2020-07-28 (×4): 40 mg via ORAL
  Filled 2020-07-25 (×4): qty 1

## 2020-07-25 MED ORDER — HYDROCODONE-ACETAMINOPHEN 5-325 MG PO TABS: 1.0000 | ORAL_TABLET | ORAL | Status: DC | PRN

## 2020-07-25 MED ORDER — IPRATROPIUM-ALBUTEROL 0.5-2.5 (3) MG/3ML IN SOLN
3.0000 mL | Freq: Four times a day (QID) | RESPIRATORY_TRACT | Status: DC
  Administered 2020-07-25 – 2020-07-28 (×10): 3 mL via RESPIRATORY_TRACT
  Filled 2020-07-25 (×10): qty 3

## 2020-07-25 MED ORDER — ONDANSETRON HCL 4 MG/2ML IJ SOLN: 4.0000 mg | Freq: Three times a day (TID) | INTRAMUSCULAR | Status: DC | PRN

## 2020-07-25 MED ORDER — INSULIN ASPART 100 UNIT/ML ~~LOC~~ SOLN
0.0000 [IU] | Freq: Three times a day (TID) | SUBCUTANEOUS | Status: DC
  Administered 2020-07-25: 20 [IU] via SUBCUTANEOUS
  Administered 2020-07-26: 15 [IU] via SUBCUTANEOUS
  Administered 2020-07-26: 22:00:00 7 [IU] via SUBCUTANEOUS
  Administered 2020-07-26: 09:00:00 11 [IU] via SUBCUTANEOUS
  Administered 2020-07-26 – 2020-07-27 (×2): 15 [IU] via SUBCUTANEOUS
  Administered 2020-07-27: 7 [IU] via SUBCUTANEOUS
  Administered 2020-07-27: 11 [IU] via SUBCUTANEOUS
  Administered 2020-07-27: 23:00:00 4 [IU] via SUBCUTANEOUS
  Administered 2020-07-28: 11 [IU] via SUBCUTANEOUS
  Filled 2020-07-25 (×10): qty 1

## 2020-07-25 MED ORDER — IPRATROPIUM-ALBUTEROL 0.5-2.5 (3) MG/3ML IN SOLN
RESPIRATORY_TRACT | Status: AC
  Administered 2020-07-25: 3 mL via RESPIRATORY_TRACT
  Filled 2020-07-25: qty 9

## 2020-07-25 MED ORDER — IBUPROFEN 400 MG PO TABS: 200.0000 mg | ORAL_TABLET | ORAL | Status: DC | PRN

## 2020-07-25 MED ORDER — NICOTINE 21 MG/24HR TD PT24
21.0000 mg | MEDICATED_PATCH | Freq: Every day | TRANSDERMAL | Status: DC
  Filled 2020-07-25: qty 1

## 2020-07-25 NOTE — Progress Notes (Signed)
elink following sepsis 

## 2020-07-25 NOTE — Progress Notes (Signed)
Pt arrived to 1C bed 111 from ED for continued medical treatment at this time.  Alert and oriented, ambulatory independently

## 2020-07-25 NOTE — Progress Notes (Signed)
CODE SEPSIS - PHARMACY COMMUNICATION  **Broad Spectrum Antibiotics should be administered within 1 hour of Sepsis diagnosis**  Time Code Sepsis Called/Page Received: 8099  Antibiotics Ordered: ceftriaxone 2g  Time of 1st antibiotic administration: 0731  Additional action taken by pharmacy: none  If necessary, Name of Provider/Nurse Contacted: [place] ,PharmD Clinical Pharmacist  07/25/2020  7:32 AM

## 2020-07-25 NOTE — H&P (Signed)
History and Physical    Stacey Gross:[id number] DOB: 1960-10-26 DOA: 07/25/2020  Referring MD/NP/PA:   PCP: Venita Lick, NP   Patient coming from:  The patient is coming from home.  At baseline, pt is independent for most of ADL.        Chief Complaint: SOB and cough  HPI: Stacey Gross is a 60 y.o. female with medical history significant of hypertension, hyperlipidemia, diabetes mellitus, COPD/asthma, GERD, depression, anxiety, tobacco abuse, PVD, stomach cancer, who presents with shortness breath and cough.  Patient states that she has been having shortness of breath and cough for more than 4 days.  She coughs up greenish yellow-colored sputum. She had virtual visit and was put on Z-pak and prednisone, without significant improvement.  Her symptoms has been progressively worsening.  She states that she had fever of 101.7 and chills at home.  Her body temperature is 98.6 in ED.  She states that she had 1 episode of chest pain in the early morning today, which was located in the front chest, sharp, 10 out of 10 severity, nonradiating.  The chest pain has completely resolved.  Currently denies any chest pain.  Patient states that she has occasional nausea and vomiting which is normal to her.  She also had some diarrhea in the past 3 days which has resolved today.  No diarrhea today.  Denies abdominal pain.  No symptoms of UTI. She was found to have oxygen desaturation to 86% on room air and 2 L oxygen was started with 95% saturation in ED. Normally patient does not wear oxygen at home. Pt is not covid vaccinated.   ED Course: pt was found to have negative COVID-19 PCR, WBC 19.8, BNP 120, lactic acid 2.0, troponin level 7, pending urinalysis, GFR> 60, temperature normal, blood pressure 114/72, heart rate 94, RR 28, chest x-ray negative for infiltration, but showed emphysema.  Patient is admitted to [address] bed as inpatient.  Review of Systems:   General: has subjective fevers,  chills, no body weight gain, has poor appetite, has fatigue HEENT: no blurry vision, hearing changes or sore throat Respiratory: has dyspnea, coughing, wheezing CV: Had chest pain, no palpitations GI: Has nausea, vomiting, diarrhea, no constipation, abdominal pain GU: no dysuria, burning on urination, increased urinary frequency, hematuria  Ext: no leg edema Neuro: no unilateral weakness, numbness, or tingling, no vision change or hearing loss Skin: no rash, no skin tear. MSK: No muscle spasm, no deformity, no limitation of range of movement in spin Heme: No easy bruising.  Travel history: No recent long distant travel.  Allergy:  Allergies  Allergen Reactions  . Doxycycline Hyclate Anaphylaxis, Swelling and Rash  . Acetaminophen   . Aspirin   . Latex Rash  . Vancomycin Rash    All mycins    Past Medical History:  Diagnosis Date  . Asthma   . Cancer ([place])    stomach  . Chronic back pain   . COPD (chronic obstructive pulmonary disease) ([place])   . Degenerative joint disease (DJD) of hip   . Diabetes mellitus without complication ([place])   . Dyspnea   . GERD (gastroesophageal reflux disease)    takes Omeprazole daily  . History of bronchitis yrs ago  . History of colon polyps    benign  . History of staph infection 10 yrs ago  . Hyperlipidemia    takes Atorvastatin daily  . Hypertension   . Peripheral vascular disease ([place])   . Tobacco  use    8 cigarettes daily   . Weakness    numbness and tingling in both legs     Past Surgical History:  Procedure Laterality Date  . ABDOMINAL HYSTERECTOMY     total  . APPENDECTOMY    . BACK SURGERY  9-10 yrs ago  . BREAST BIOPSY Right    Core- neg  . COLONOSCOPY    . COLONOSCOPY WITH PROPOFOL N/A 09/20/2017   Procedure: COLONOSCOPY WITH PROPOFOL;  Surgeon: Virgel Manifold, MD;  Location: [facility] ENDOSCOPY;  Service: Endoscopy;  Laterality: N/A;  . COLONOSCOPY WITH PROPOFOL N/A 04/24/2018   Procedure: COLONOSCOPY WITH PROPOFOL;   Surgeon: Lucilla Lame, MD;  Location[facility] ENDOSCOPY;  Service: Endoscopy;  Laterality: N/A;  . ESOPHAGOGASTRODUODENOSCOPY (EGD) WITH PROPOFOL N/A 09/20/2017   Procedure: ESOPHAGOGASTRODUODENOSCOPY (EGD) WITH PROPOFOL;  Surgeon: Virgel Manifold, MD;  Location: [facility] ENDOSCOPY;  Service: Endoscopy;  Laterality: N/A;  . ESOPHAGOGASTRODUODENOSCOPY (EGD) WITH PROPOFOL N/A 11/20/2018   Procedure: ESOPHAGOGASTRODUODENOSCOPY (EGD) WITH PROPOFOL;  Surgeon: Lucilla Lame, MD;  Location: [facility] ENDOSCOPY;  Service: Endoscopy;  Laterality: N/A;  . PERIPHERAL VASCULAR CATHETERIZATION Left 03/17/2016   Procedure: Lower Extremity Angiography;  Surgeon: Algernon Huxley, MD;  Location: [facility];  Service: Cardiovascular;  Laterality: Left;  . TOTAL SHOULDER ARTHROPLASTY Right 07/24/2019   Procedure: RIGHT TOTAL SHOULDER ARTHROPLASTY;  Surgeon: Lovell Sheehan, MD;  Location: [facility] ORS;  Service: Orthopedics;  Laterality: Right;    Social History:  reports that she has been smoking cigarettes. She has a 96.00 pack-year smoking history. She has never used smokeless tobacco. She reports that she does not drink alcohol and does not use drugs.  Family History:  Family History  Problem Relation Age of Onset  . Hyperlipidemia Mother   . Hyperlipidemia Father   . Melanoma Sister   . Breast cancer Neg Hx      Prior to Admission medications   Medication Sig Start Date End Date Taking? Authorizing Provider  albuterol (PROVENTIL) (2.5 MG/3ML) 0.083% nebulizer solution USE 1 VIAL (3 ML = 2.5 MG TOTAL) BY NEBULIZATION EVERY 6 HOURS AS NEEDED FORWHEEZING OR SHORTNESS OF BREATH 12/19/19   Cannady, Jolene T, NP  albuterol (VENTOLIN HFA) 108 (90 Base) MCG/ACT inhaler INHALE 2 PUFFS BY MOUTH INTO THE LUNGS EVERY 6 HOURS AS NEEDED FOR WHEEZING OR SHORTNESS OF BREATH 07/22/20   Cannady, Jolene T, NP  azithromycin (ZITHROMAX) 250 MG tablet Take 2 tablets by mouth (500 MG) on day 1 and then 1 tablet (250 MG) on days 2 to 5.  07/21/20   Cannady, Jolene T, NP  benzonatate (TESSALON) 200 MG capsule Take 1 capsule (200 mg total) by mouth 2 (two) times daily as needed for cough. 07/21/20   Cannady, Henrine Screws T, NP  Blood Glucose Monitoring Suppl (ONE TOUCH ULTRA SYSTEM KIT) W/DEVICE KIT 1 kit by Does not apply route once. Patient taking differently: 1 kit by Does not apply route daily. 02/03/15   Guadalupe Maple, MD  Budeson-Glycopyrrol-Formoterol (BREZTRI AEROSPHERE) 160-9-4.8 MCG/ACT AERO Inhale 2 puffs into the lungs 2 (two) times daily. 01/07/20   Cannady, Henrine Screws T, NP  chlorpheniramine-HYDROcodone (TUSSIONEX PENNKINETIC ER) 10-8 MG/5ML SUER Take 5 mLs by mouth every 12 (twelve) hours as needed for cough. 07/21/20   Marnee Guarneri T, NP  cilostazol (PLETAL) 100 MG tablet Take 1 tablet (100 mg total) by mouth 2 (two) times daily before a meal. 02/06/19   Kris Hartmann, NP  citalopram (CELEXA) 20 MG tablet Take  1 tablet (20 mg total) by mouth daily. 05/13/20   Cannady, Henrine Screws T, NP  clobetasol (TEMOVATE) 0.05 % external solution Patient to mix solution in 1 jar of CeraVe Cream. Apply to itchy rash twice daily until improved. 11/20/19   Brendolyn Patty, MD  cyclobenzaprine (FLEXERIL) 10 MG tablet TAKE ONE TABLET BY MOUTH AT BEDTIME Patient taking differently: Take 10 mg by mouth at bedtime as needed for muscle spasms. 08/29/18   Guadalupe Maple, MD  docusate sodium (COLACE) 100 MG capsule Take 100 mg by mouth daily as needed for mild constipation. Patient not taking: Reported on 07/21/2020    [provider]  glucose blood test strip Use to check blood sugar three times a day. 06/29/18   Cannady, Henrine Screws T, NP  HYDROcodone-acetaminophen (NORCO/VICODIN) 5-325 MG tablet Take 1 tablet by mouth every 4 (four) hours as needed. 03/09/20   [provider]  hydrOXYzine (ATARAX/VISTARIL) 25 MG tablet Take 1 tablet (25 mg total) by mouth as directed. Take 1-2 tablets by mouth every 4-6 hours as needed for itch. MAY MAKE  DROWSY Patient not taking: Reported on 07/21/2020 05/18/20   Brendolyn Patty, MD  ibuprofen (ADVIL) 600 MG tablet Take 600 mg by mouth every 6 (six) hours as needed. Patient not taking: Reported on 07/21/2020 06/24/20   [provider]  losartan (COZAAR) 25 MG tablet Take 1 tablet (25 mg total) by mouth daily. Patient taking differently: Take 25 mg by mouth every evening. 05/14/19   Cannady, Henrine Screws T, NP  meloxicam (MOBIC) 15 MG tablet Take 1 tablet (15 mg total) by mouth daily. 05/13/20   Cannady, Henrine Screws T, NP  metFORMIN (GLUCOPHAGE) 500 MG tablet TAKE 1 TABLET BY MOUTH TWICE A DAY WITH A MEAL 09/25/19   Cannady, Henrine Screws T, NP  metoprolol tartrate (LOPRESSOR) 100 MG tablet Take 1 tablet (100 mg total) by mouth once for 1 dose. Take 2 hours prior to your CT scan. 01/09/20 01/09/20  Kate Sable, MD  OZEMPIC, 0.25 OR 0.5 MG/DOSE, 2 MG/1.5ML SOPN INJECT 0.5 MG INTO THE SKIN ONCE A WEEK 07/22/20   Cannady, Jolene T, NP  pantoprazole (PROTONIX) 40 MG tablet Take 1 tablet (40 mg total) by mouth daily. 05/13/20   Cannady, Henrine Screws T, NP  predniSONE (DELTASONE) 20 MG tablet Take 2 tablets (40 mg total) by mouth daily with breakfast for 5 days. 07/21/20 07/26/20  Cannady, Henrine Screws T, NP  rosuvastatin (CRESTOR) 20 MG tablet Take 1 tablet (20 mg total) by mouth daily. 01/07/20   Cannady, Henrine Screws T, NP  tiZANidine (ZANAFLEX) 4 MG tablet Take 4 mg by mouth at bedtime. 04/01/20   [provider]  traZODone (DESYREL) 100 MG tablet TAKE 1 TABLET BY MOUTH AT BEDTIME AS NEEDED FOR SLEEP 06/24/20   Cannady, Henrine Screws T, NP  vitamin B-12 (CYANOCOBALAMIN) 1000 MCG tablet Take 1 tablet (1,000 mcg total) by mouth daily. 05/16/19   Marnee Guarneri T, NP    Physical Exam: Vitals:   07/25/20 0610 07/25/20 0617 07/25/20 0645 07/25/20 0700  BP:    125/79  Pulse:  88 94 95  Resp:  (!) 27 (!) 28 20  Temp:      TempSrc:      SpO2:  98% 95% 94%  Weight: 94.3 kg     Height: _0  (1.702 m)      General: Not in acute  distress HEENT:       Eyes: PERRL, EOMI, no scleral icterus.       ENT: No  discharge from the ears and nose, no pharynx injection, no tonsillar enlargement.        Neck: No JVD, no bruit, no mass felt. Heme: No neck lymph node enlargement. Cardiac: S1/S2, RRR, No murmurs, No gallops or rubs. Respiratory: Has decreased air movement bilaterally, has wheezing and rhonchi bilaterally GI: Soft, nondistended, nontender, no rebound pain, no organomegaly, BS present. GU: No hematuria Ext: No pitting leg edema bilaterally. 1+DP/PT pulse bilaterally. Musculoskeletal: No joint deformities, No joint redness or warmth, no limitation of ROM in spin. Skin: No rashes.  Neuro: Alert, oriented X3, cranial nerves II-XII grossly intact, moves all extremities normally. Psych: Patient is not psychotic, no suicidal or hemocidal ideation.  Labs on Admission: I have personally reviewed following labs and imaging studies  CBC: Recent Labs  Lab 07/25/20 0618  WBC 19.8*  NEUTROABS 15.4*  HGB 13.6  HCT 40.4  MCV 91.0  PLT 993   Basic Metabolic Panel: Recent Labs  Lab 07/25/20 0618  NA 132*  K 3.8  CL 95*  CO2 23  GLUCOSE 325*  BUN 15  CREATININE 1.04*  CALCIUM 9.0   GFR: Estimated Creatinine Clearance: 66.1 mL/min (A) (by C-G formula based on SCr of 1.04 mg/dL (H)). Liver Function Tests: Recent Labs  Lab 07/25/20 0618  AST 26  ALT 19  ALKPHOS 85  BILITOT 0.8  PROT 7.4  ALBUMIN 3.7   No results for input(s): LIPASE, AMYLASE in the last 168 hours. No results for input(s): AMMONIA in the last 168 hours. Coagulation Profile: Recent Labs  Lab 07/25/20 0618  INR 1.1   Cardiac Enzymes: No results for input(s): CKTOTAL, CKMB, CKMBINDEX, TROPONINI in the last 168 hours. BNP (last 3 results) No results for input(s): PROBNP in the last 8760 hours. HbA1C: No results for input(s): HGBA1C in the last 72 hours. CBG: No results for input(s): GLUCAP in the last 168 hours. Lipid Profile: No  results for input(s): CHOL, HDL, LDLCALC, TRIG, CHOLHDL, LDLDIRECT in the last 72 hours. Thyroid Function Tests: No results for input(s): TSH, T4TOTAL, FREET4, T3FREE, THYROIDAB in the last 72 hours. Anemia Panel: No results for input(s): VITAMINB12, FOLATE, FERRITIN, TIBC, IRON, RETICCTPCT in the last 72 hours. Urine analysis:    Component Value Date/Time   COLORURINE YELLOW (A) 06/07/2017 1114   APPEARANCEUR Clear 06/17/2019 1347   LABSPEC 1.014 06/07/2017 1114   PHURINE 7.0 06/07/2017 1114   GLUCOSEU Negative 06/17/2019 1347   HGBUR NEGATIVE 06/07/2017 1114   BILIRUBINUR Negative 06/17/2019 [place] 06/07/2017 1114   PROTEINUR Negative 06/17/2019 1347   PROTEINUR NEGATIVE 06/07/2017 1114   NITRITE Negative 06/17/2019 1347   NITRITE NEGATIVE 06/07/2017 1114   LEUKOCYTESUR 1+ (A) 06/17/2019 1347   Sepsis Labs: _0 (procalcitonin:4,lacticidven:4) ) Recent Results (from the past 240 hour(s))  Resp Panel by RT-PCR (Flu A&B, Covid) Nasopharyngeal Swab     Status: None   Collection Time: 07/25/20  6:18 AM   Specimen: Nasopharyngeal Swab; Nasopharyngeal(NP) swabs in vial transport medium  Result Value Ref Range Status   SARS Coronavirus 2 by RT PCR NEGATIVE NEGATIVE Final    Comment: (NOTE) SARS-CoV-2 target nucleic acids are NOT DETECTED.  The SARS-CoV-2 RNA is generally detectable in upper respiratory specimens during the acute phase of infection. The lowest concentration of SARS-CoV-2 viral copies this assay can detect is 138 copies/mL. A negative result does not preclude SARS-Cov-2 infection and should not be used as the sole basis for treatment or other patient management decisions. A negative result  may occur with  improper specimen collection/handling, submission of specimen other than nasopharyngeal swab, presence of viral mutation(s) within the areas targeted by this assay, and inadequate number of viral copies(<138 copies/mL). A negative result  must be combined with clinical observations, patient history, and epidemiological information. The expected result is Negative.  Fact Sheet for Patients:  EntrepreneurPulse.com.au  Fact Sheet for Healthcare Providers:  IncredibleEmployment.be  This test is no t yet approved or cleared by the [place] FDA and  has been authorized for detection and/or diagnosis of SARS-CoV-2 by FDA under an Emergency Use Authorization (EUA). This EUA will remain  in effect (meaning this test can be used) for the duration of the COVID-19 declaration under Section 564(b)(1) of the Act, 21 U.S.C.section 360bbb-3(b)(1), unless the authorization is terminated  or revoked sooner.       Influenza A by PCR NEGATIVE NEGATIVE Final   Influenza B by PCR NEGATIVE NEGATIVE Final    Comment: (NOTE) The Xpert Xpress SARS-CoV-2/FLU/RSV plus assay is intended as an aid in the diagnosis of influenza from Nasopharyngeal swab specimens and should not be used as a sole basis for treatment. Nasal washings and aspirates are unacceptable for Xpert Xpress SARS-CoV-2/FLU/RSV testing.  Fact Sheet for Patients: EntrepreneurPulse.com.au  Fact Sheet for Healthcare Providers: IncredibleEmployment.be  This test is not yet approved or cleared by the [place] FDA and has been authorized for detection and/or diagnosis of SARS-CoV-2 by FDA under an Emergency Use Authorization (EUA). This EUA will remain in effect (meaning this test can be used) for the duration of the COVID-19 declaration under Section 564(b)(1) of the Act, 21 U.S.C. section 360bbb-3(b)(1), unless the authorization is terminated or revoked.  Performed at [facility], [address], [place], [place] [address]      Radiological Exams on Admission: DG Chest [facility] 1 View  Result Date: 07/25/2020 CLINICAL DATA:  Questionable sepsis. Unspecified chest pain and shortness  of breath. EXAM: PORTABLE CHEST 1 VIEW COMPARISON:  Chest x-ray 02/01/2019, CT chest 06/05/2020 FINDINGS: Emphysematous changes. The heart size and mediastinal contours are within normal limits. Again noted is mild peribronchial cuffing. Emphysematous changes with biapical pleural/pulmonary scarring. No focal consolidation. No pulmonary edema. No pleural effusion. No pneumothorax. No acute osseous abnormality. IMPRESSION: 1. No active disease. 2.  Emphysema (ICD10-J43.9). Electronically Signed   By: Iven Finn M.D.   On: 07/25/2020 07:00     EKG: I have personally reviewed.  Sinus rhythm, QTC 416, low voltage, early R wave progression.  Assessment/Plan Principal Problem:   COPD exacerbation (HCC) Active Problems:   Hyperlipidemia associated with type 2 diabetes mellitus ([place])   Hypertension associated with diabetes ([place])   GERD (gastroesophageal reflux disease)   Depression   Type 2 diabetes, controlled, with peripheral circulatory disorder (HCC)   Acute respiratory failure with hypoxia (HCC)   Tobacco abuse   PVD (peripheral vascular disease) (HCC)   Sepsis (HCC)  Acute respiratory failure with hypoxia and sepsis due to COPD exacerbation: Patient has productive cough, shortness of breath, wheezing and rhonchi on lung auscultation, chest x-ray negative for infiltration, clinically consistent with COPD exacerbation.  Early stage of pneumonia is not completely ruled out. Another differential diagnosis is PE.  Patient had 1 episode  of chest pain, which has completely resolved, currently low suspicions of for PE.  If patient has recurrent chest pain, will consider CT angiogram to rule out PE.  Patient meets criteria for sepsis with leukocytosis with WBC 19.8, tachycardia with heart rate 94,  tachypnea with RR 28.  Lactic acid is 2.0.   -Will admit to med-surg bed as inpatient -Bronchodilators -Solu-Medrol 40 mg IV bid -IV Rocephin and azithromycin -Mucinex for cough  -Incentive  spirometry -Follow up blood culture x2, sputum culture -Nasal cannula oxygen as needed to maintain O2 saturation 93% or greater -will get Procalcitonin and trend lactic acid levels per sepsis protocol. -IVF: 2L of NS bolus in ED, will give more IVF is lactic acid is persistently elevated  Hyperlipidemia associated with type 2 diabetes mellitus ([place]) -Crestor  Hypertension associated with diabetes ([place]) -IV hydralazine as needed -Continue Cozaar, metoprolol  GERD (gastroesophageal reflux disease) -Protonix  Depression -Continue home medication, Celexa  Type 2 diabetes, controlled, with peripheral circulatory disorder [facility]): Recent A1c 6.6, well controlled.  Patient taking Ozempic and metformin -Sliding scale insulin  Tobacco abuse -Nicotine patch  PVD (peripheral vascular disease) (HCC) -Cilostazol    DVT ppx: SQ Lovenox Code Status: Full code Family Communication:  Yes, patient's  sister  at bed side Disposition Plan:  Anticipate discharge back to previous environment Consults called:  none Admission status and Level of care: Med-Surg:   as inpt     Status is: Inpatient  Remains inpatient appropriate because:Inpatient level of care appropriate due to severity of illness   Dispo: The patient is from: Home              Anticipated d/c is to: Home              Patient currently is not medically stable to d/c.   Difficult to place patient No          Date of Service 07/25/2020    [facility]   If 7PM-7AM, please contact night-coverage [email or web address] 07/25/2020, 8:39 AM

## 2020-07-25 NOTE — ED Provider Notes (Signed)
[facility] Emergency Department Provider Note  ____________________________________________   Event Date/Time   First MD Initiated Contact with Patient 07/25/20 0559     (approximate)  I have reviewed the triage vital signs and the nursing notes.   HISTORY  Chief Complaint No chief complaint on file.   HPI Stacey Gross is a 60 y.o. female with history as listed below which notably includes COPD with no chronic supplementary oxygen requirement.  She presents by private vehicle for evaluation of worsening shortness of breath and now with sharp chest pain.  She said her symptoms have been worsening for 3 to 4 days.  She had a virtual visit with her primary care provider who prescribed her prednisone and azithromycin.  She has been using her nebulizer but her breathing has been getting worse in spite of the treatment.  She says she has had subjective fevers recently.  She is not vaccinated for COVID-19.  She has had no sore throat and she denies abdominal pain.  She has occasional nausea and vomiting but that is normal for her.   Her respiratory symptoms are severe and any amount of exertion makes it much worse.  She is starting to feel little bit better after being given a DuoNeb treatment which she was receiving when I walked in the room to examine her.         Past Medical History:  Diagnosis Date  . Asthma   . Cancer ([place])    stomach  . Chronic back pain   . COPD (chronic obstructive pulmonary disease) ([place])   . Degenerative joint disease (DJD) of hip   . Diabetes mellitus without complication ([place])   . Dyspnea   . GERD (gastroesophageal reflux disease)    takes Omeprazole daily  . History of bronchitis yrs ago  . History of colon polyps    benign  . History of staph infection 10 yrs ago  . Hyperlipidemia    takes Atorvastatin daily  . Hypertension   . Peripheral vascular disease ([place])   . Tobacco use    8 cigarettes daily   . Weakness     numbness and tingling in both legs     Patient Active Problem List   Diagnosis Date Noted  . Situational depression 05/23/2020  . Otitis media, left 05/13/2020  . B12 deficiency 01/07/2020  . Obesity 10/04/2019  . Status post total shoulder arthroplasty, right 07/24/2019  . Senile purpura ([place]) 06/17/2019  . Hepatic steatosis 06/03/2019  . Nummular dermatitis 05/28/2019  . Centrilobular emphysema ([place]) 05/14/2019  . COPD exacerbation ([place]) 02/11/2019  . Generalized anxiety disorder 12/17/2018  . Type 2 diabetes, controlled, with peripheral circulatory disorder ([place]) 03/21/2018  . Hx of colonic polyps   . Benign neoplasm of ascending colon   . Diverticulosis of large intestine without diverticulitis   . Ectopic gastric tissue   . Aortic atherosclerosis ([place]) 04/18/2017  . Advanced care planning/counseling discussion 02/22/2017  . Blue toe syndrome ([employer]) 03/04/2016  . Spinal stenosis at L4-L5 level 02/25/2015  . GERD (gastroesophageal reflux disease) 12/30/2014  . Hyperlipidemia associated with type 2 diabetes mellitus ([place])   . Hypertension associated with diabetes ([place])   . Degenerative joint disease (DJD) of hip   . S/P lumbar fusion 07/02/2013  . Nicotine dependence, cigarettes, w unsp disorders 02/23/2010    Past Surgical History:  Procedure Laterality Date  . ABDOMINAL HYSTERECTOMY     total  . APPENDECTOMY    . BACK SURGERY  9-10 yrs ago  . BREAST BIOPSY Right    Core- neg  . COLONOSCOPY    . COLONOSCOPY WITH PROPOFOL N/A 09/20/2017   Procedure: COLONOSCOPY WITH PROPOFOL;  Surgeon: Virgel Manifold, MD;  Location: [facility] ENDOSCOPY;  Service: Endoscopy;  Laterality: N/A;  . COLONOSCOPY WITH PROPOFOL N/A 04/24/2018   Procedure: COLONOSCOPY WITH PROPOFOL;  Surgeon: Lucilla Lame, MD;  Location[facility] ENDOSCOPY;  Service: Endoscopy;  Laterality: N/A;  . ESOPHAGOGASTRODUODENOSCOPY (EGD) WITH PROPOFOL N/A 09/20/2017   Procedure: ESOPHAGOGASTRODUODENOSCOPY (EGD) WITH  PROPOFOL;  Surgeon: Virgel Manifold, MD;  Location: [facility] ENDOSCOPY;  Service: Endoscopy;  Laterality: N/A;  . ESOPHAGOGASTRODUODENOSCOPY (EGD) WITH PROPOFOL N/A 11/20/2018   Procedure: ESOPHAGOGASTRODUODENOSCOPY (EGD) WITH PROPOFOL;  Surgeon: Lucilla Lame, MD;  Location: [facility] ENDOSCOPY;  Service: Endoscopy;  Laterality: N/A;  . PERIPHERAL VASCULAR CATHETERIZATION Left 03/17/2016   Procedure: Lower Extremity Angiography;  Surgeon: Algernon Huxley, MD;  Location: [place] [facility];  Service: Cardiovascular;  Laterality: Left;  . TOTAL SHOULDER ARTHROPLASTY Right 07/24/2019   Procedure: RIGHT TOTAL SHOULDER ARTHROPLASTY;  Surgeon: Lovell Sheehan, MD;  Location: [facility] ORS;  Service: Orthopedics;  Laterality: Right;    Prior to Admission medications   Medication Sig Start Date End Date Taking? Authorizing Provider  albuterol (PROVENTIL) (2.5 MG/3ML) 0.083% nebulizer solution USE 1 VIAL (3 ML = 2.5 MG TOTAL) BY NEBULIZATION EVERY 6 HOURS AS NEEDED FORWHEEZING OR SHORTNESS OF BREATH 12/19/19   Cannady, Jolene T, NP  albuterol (VENTOLIN HFA) 108 (90 Base) MCG/ACT inhaler INHALE 2 PUFFS BY MOUTH INTO THE LUNGS EVERY 6 HOURS AS NEEDED FOR WHEEZING OR SHORTNESS OF BREATH 07/22/20   Cannady, Jolene T, NP  azithromycin (ZITHROMAX) 250 MG tablet Take 2 tablets by mouth (500 MG) on day 1 and then 1 tablet (250 MG) on days 2 to 5. 07/21/20   Cannady, Jolene T, NP  benzonatate (TESSALON) 200 MG capsule Take 1 capsule (200 mg total) by mouth 2 (two) times daily as needed for cough. 07/21/20   Cannady, Henrine Screws T, NP  Blood Glucose Monitoring Suppl (ONE TOUCH ULTRA SYSTEM KIT) W/DEVICE KIT 1 kit by Does not apply route once. Patient taking differently: 1 kit by Does not apply route daily. 02/03/15   Guadalupe Maple, MD  Budeson-Glycopyrrol-Formoterol (BREZTRI AEROSPHERE) 160-9-4.8 MCG/ACT AERO Inhale 2 puffs into the lungs 2 (two) times daily. 01/07/20   Cannady, Henrine Screws T, NP  chlorpheniramine-HYDROcodone (TUSSIONEX  PENNKINETIC ER) 10-8 MG/5ML SUER Take 5 mLs by mouth every 12 (twelve) hours as needed for cough. 07/21/20   Marnee Guarneri T, NP  cilostazol (PLETAL) 100 MG tablet Take 1 tablet (100 mg total) by mouth 2 (two) times daily before a meal. 02/06/19   Kris Hartmann, NP  citalopram (CELEXA) 20 MG tablet Take 1 tablet (20 mg total) by mouth daily. 05/13/20   Cannady, Henrine Screws T, NP  clobetasol (TEMOVATE) 0.05 % external solution Patient to mix solution in 1 jar of CeraVe Cream. Apply to itchy rash twice daily until improved. 11/20/19   Brendolyn Patty, MD  cyclobenzaprine (FLEXERIL) 10 MG tablet TAKE ONE TABLET BY MOUTH AT BEDTIME Patient taking differently: Take 10 mg by mouth at bedtime as needed for muscle spasms. 08/29/18   Guadalupe Maple, MD  docusate sodium (COLACE) 100 MG capsule Take 100 mg by mouth daily as needed for mild constipation. Patient not taking: Reported on 07/21/2020    [provider]  glucose blood test strip Use to check blood sugar three times a  day. 06/29/18   Marnee Guarneri T, NP  HYDROcodone-acetaminophen (NORCO/VICODIN) 5-325 MG tablet Take 1 tablet by mouth every 4 (four) hours as needed. 03/09/20   [provider]  hydrOXYzine (ATARAX/VISTARIL) 25 MG tablet Take 1 tablet (25 mg total) by mouth as directed. Take 1-2 tablets by mouth every 4-6 hours as needed for itch. MAY MAKE DROWSY Patient not taking: Reported on 07/21/2020 05/18/20   Brendolyn Patty, MD  ibuprofen (ADVIL) 600 MG tablet Take 600 mg by mouth every 6 (six) hours as needed. Patient not taking: Reported on 07/21/2020 06/24/20   [provider]  losartan (COZAAR) 25 MG tablet Take 1 tablet (25 mg total) by mouth daily. Patient taking differently: Take 25 mg by mouth every evening. 05/14/19   Cannady, Henrine Screws T, NP  meloxicam (MOBIC) 15 MG tablet Take 1 tablet (15 mg total) by mouth daily. 05/13/20   Cannady, Henrine Screws T, NP  metFORMIN (GLUCOPHAGE) 500 MG tablet TAKE 1 TABLET BY MOUTH TWICE A DAY WITH A  MEAL 09/25/19   Cannady, Henrine Screws T, NP  metoprolol tartrate (LOPRESSOR) 100 MG tablet Take 1 tablet (100 mg total) by mouth once for 1 dose. Take 2 hours prior to your CT scan. 01/09/20 01/09/20  Kate Sable, MD  OZEMPIC, 0.25 OR 0.5 MG/DOSE, 2 MG/1.5ML SOPN INJECT 0.5 MG INTO THE SKIN ONCE A WEEK 07/22/20   Cannady, Jolene T, NP  pantoprazole (PROTONIX) 40 MG tablet Take 1 tablet (40 mg total) by mouth daily. 05/13/20   Cannady, Henrine Screws T, NP  predniSONE (DELTASONE) 20 MG tablet Take 2 tablets (40 mg total) by mouth daily with breakfast for 5 days. 07/21/20 07/26/20  Cannady, Henrine Screws T, NP  rosuvastatin (CRESTOR) 20 MG tablet Take 1 tablet (20 mg total) by mouth daily. 01/07/20   Cannady, Henrine Screws T, NP  tiZANidine (ZANAFLEX) 4 MG tablet Take 4 mg by mouth at bedtime. 04/01/20   [provider]  traZODone (DESYREL) 100 MG tablet TAKE 1 TABLET BY MOUTH AT BEDTIME AS NEEDED FOR SLEEP 06/24/20   Cannady, Henrine Screws T, NP  vitamin B-12 (CYANOCOBALAMIN) 1000 MCG tablet Take 1 tablet (1,000 mcg total) by mouth daily. 05/16/19   Marnee Guarneri T, NP    Allergies Doxycycline hyclate, Acetaminophen, Aspirin, Latex, and Vancomycin  Family History  Problem Relation Age of Onset  . Hyperlipidemia Mother   . Hyperlipidemia Father   . Melanoma Sister   . Breast cancer Neg Hx     Social History Social History   Tobacco Use  . Smoking status: Current Every Day Smoker    Packs/day: 2.00    Years: 48.00    Pack years: 96.00    Types: Cigarettes  . Smokeless tobacco: Never Used  Vaping Use  . Vaping Use: Never used  Substance Use Topics  . Alcohol use: No  . Drug use: No    Review of Systems Constitutional: No fever/chills Eyes: No visual changes. ENT: No sore throat. Cardiovascular: Positive for chest pain. Respiratory: Positive for gradually worsening and now severe shortness of breath of the last several days in spite of medication treatment as an outpatient. Gastrointestinal: No abdominal  pain.  No nausea, no vomiting.  No diarrhea.  No constipation. Genitourinary: Negative for dysuria. Musculoskeletal: Negative for neck pain.  Negative for back pain. Integumentary: Negative for rash. Neurological: Negative for headaches, focal weakness or numbness.   ____________________________________________   PHYSICAL EXAM:  VITAL SIGNS: ED Triage Vitals  Enc Vitals Group     BP 07/25/20 0712  114/72     Pulse Rate 07/25/20 0608 88     Resp 07/25/20 0608 (!) 28     Temp 07/25/20 0608 98.6 F (37 C)     Temp Source 07/25/20 0608 Oral     SpO2 07/25/20 0607 (S) (!) 86 %     Weight 07/25/20 0610 94.3 kg (208 lb)     Height 07/25/20 0610 1.702 m (5' 7" )     Head Circumference --      Peak Flow --      Pain Score 07/25/20 0600 10     Pain Loc --      Pain Edu? --      Excl. in [place]? --     Constitutional: Alert and oriented.  Eyes: Conjunctivae are normal.  Head: Atraumatic. Nose: No congestion/rhinnorhea. Mouth/Throat: Patient is wearing a mask. Neck: No stridor.  No meningeal signs.   Cardiovascular: Normal rate, regular rhythm. Good peripheral circulation. Respiratory: Moderate respiratory distress with increased respiratory rate and effort with accessory muscle usage and intercostal retractions.  Lung sounds are coarse bilaterally without expiratory wheezing.  Speaking in short sentences and phrases. Gastrointestinal: Soft and nontender. No distention.  Musculoskeletal: No lower extremity tenderness nor edema. No gross deformities of extremities. Neurologic:  Normal speech and language. No gross focal neurologic deficits are appreciated.  Skin:  Skin is warm, dry and intact. Psychiatric: Mood and affect are normal. Speech and behavior are normal.  ____________________________________________   LABS (all labs ordered are listed, but only abnormal results are displayed)  Labs Reviewed  COMPREHENSIVE METABOLIC PANEL - Abnormal; Notable for the following components:       Result Value   Sodium 132 (*)    Chloride 95 (*)    Glucose, Bld 325 (*)    Creatinine, Ser 1.04 (*)    All other components within normal limits  BRAIN NATRIURETIC PEPTIDE - Abnormal; Notable for the following components:   B Natriuretic Peptide 120.3 (*)    All other components within normal limits  CBC WITH DIFFERENTIAL/PLATELET - Abnormal; Notable for the following components:   WBC 19.8 (*)    Neutro Abs 15.4 (*)    Monocytes Absolute 1.3 (*)    Abs Immature Granulocytes 0.26 (*)    All other components within normal limits  LACTIC ACID, PLASMA - Abnormal; Notable for the following components:   Lactic Acid, Venous 2.0 (*)    All other components within normal limits  RESP PANEL BY RT-PCR (FLU A&B, COVID) ARPGX2  CULTURE, BLOOD (SINGLE)  URINE CULTURE  CULTURE, BLOOD (SINGLE)  PROTIME-INR  LACTIC ACID, PLASMA  URINALYSIS, COMPLETE (UACMP) WITH MICROSCOPIC  TROPONIN I (HIGH SENSITIVITY)   ____________________________________________  EKG  ED ECG REPORT I, Hinda Kehr, the attending physician, personally viewed and interpreted this ECG.  Date: 07/25/2020 EKG Time: 6:02 AM Rate: 99 Rhythm: Borderline sinus tachycardia QRS Axis: Borderline right axis deviation Intervals: normal ST/T Wave abnormalities: Non-specific ST segment / T-wave changes, but no clear evidence of acute ischemia. Narrative Interpretation: no definitive evidence of acute ischemia; does not meet STEMI criteria.   ____________________________________________  RADIOLOGY I, Hinda Kehr, personally viewed and evaluated these images (plain radiographs) as part of my medical decision making, as well as reviewing the written report by the radiologist.  ED MD interpretation:  No acute abnormalities  Official radiology report(s): DG Chest Port 1 View  Result Date: 07/25/2020 CLINICAL DATA:  Questionable sepsis. Unspecified chest pain and shortness of breath. EXAM: PORTABLE CHEST 1 VIEW  COMPARISON:   Chest x-ray 02/01/2019, CT chest 06/05/2020 FINDINGS: Emphysematous changes. The heart size and mediastinal contours are within normal limits. Again noted is mild peribronchial cuffing. Emphysematous changes with biapical pleural/pulmonary scarring. No focal consolidation. No pulmonary edema. No pleural effusion. No pneumothorax. No acute osseous abnormality. IMPRESSION: 1. No active disease. 2.  Emphysema (ICD10-J43.9). Electronically Signed   By: Iven Finn M.D.   On: 07/25/2020 07:00    ____________________________________________   PROCEDURES   Procedure(s) performed (including Critical Care):  .Critical Care Performed by: Hinda Kehr, MD Authorized by: Hinda Kehr, MD   Critical care provider statement:    Critical care time (minutes):  30   Critical care time was exclusive of:  Separately billable procedures and treating other patients   Critical care was necessary to treat or prevent imminent or life-threatening deterioration of the following conditions:  Respiratory failure and sepsis   Critical care was time spent personally by me on the following activities:  Development of treatment plan with patient or surrogate, discussions with consultants, evaluation of patient's response to treatment, examination of patient, obtaining history from patient or surrogate, ordering and performing treatments and interventions, ordering and review of laboratory studies, ordering and review of radiographic studies, pulse oximetry, re-evaluation of patient's condition and review of old charts .1-3 Lead EKG Interpretation Performed by: Hinda Kehr, MD Authorized by: Hinda Kehr, MD     Interpretation: abnormal     ECG rate:  100   ECG rate assessment: tachycardic     Rhythm: sinus tachycardia     Ectopy: none     Conduction: normal       ____________________________________________   INITIAL IMPRESSION / MDM / ASSESSMENT AND PLAN / ED COURSE  As part of my medical decision  making, I reviewed the following data within the [place] notes reviewed and incorporated, Labs reviewed , EKG interpreted , Old chart reviewed, Radiograph reviewed  and Notes from prior ED visits   Differential diagnosis includes, but is not limited to, COPD exacerbation, COVID-19, community-acquired pneumonia, new onset CHF, less likely PE or ACS.  The patient is on the cardiac monitor to evaluate for evidence of arrhythmia and/or significant heart rate changes.  COPD exacerbation/bronchitis is most likely diagnosis although the patient is not vaccinated against COVID-19 and she reports that she has been having some fevers.  I will hold off on empiric antibiotics.  Initially she was borderline tachycardic but her heart rate is actually come down.  She technically does not meet sepsis criteria at the moment but she was 86% on room air upon arrival.  She has feeling better after breathing treatment.  COVID swab, chest x-ray, standard lab work, duo nebs x3, Solu-Medrol 125 mg IV, reassessment.  Anticipate admission given the patient was hypoxemic on room air at rest and needed emergent and critical intervention to avoid rapid deterioration.       Clinical Course as of 07/25/20 0721  Sat Jul 25, 2020  0633 WBC(!): 19.8 Significant leukocytosis but the patient has been on steroids as an outpatient.  However, given the patient's respiratory distress, now confirmed leukocytosis, tachypnea as well as borderline tachycardia, will proceed with code sepsis for 1L lactated ringers and abx for presumed CAP (ceftriaxone 2g IV and azithromycin 500 mg IV). [CF]  7829 Lactic Acid, Venous(!!): 2.0 Could support sepsis diagnosis or be a result of respiratory failure. [CF]  0719 SARS Coronavirus 2 by RT PCR: NEGATIVE [CF]  0719 High-sensitivity troponin is  7, BNP slightly elevated at 120.  COVID-19 swab was negative.  Patient is feeling more comfortable after her DuoNeb's but is still  requiring supplemental oxygen.  She agrees with the plan for admission to the hospital.  I placed a hospitalist consult order. [CF]  0720 DG Chest [facility] I personally reviewed the patient's imaging and agree with the radiologist's interpretation that there is no evidence of lobar pneumonia. [CF]    Clinical Course User Index [CF] Hinda Kehr, MD     ____________________________________________  FINAL CLINICAL IMPRESSION(S) / ED DIAGNOSES  Final diagnoses:  Acute respiratory failure with hypoxemia (HCC)  COPD exacerbation ([place])  Sepsis, due to unspecified organism, unspecified whether acute organ dysfunction present ([place])     MEDICATIONS GIVEN DURING THIS VISIT:  Medications  lactated ringers bolus 1,000 mL (has no administration in time range)  cefTRIAXone (ROCEPHIN) 2 g in sodium chloride 0.9 % 100 mL IVPB (has no administration in time range)  azithromycin (ZITHROMAX) 500 mg in sodium chloride 0.9 % 250 mL IVPB (has no administration in time range)  ipratropium-albuterol (DUONEB) 0.5-2.5 (3) MG/3ML nebulizer solution 3 mL (3 mLs Nebulization Given 07/25/20 0600)  ipratropium-albuterol (DUONEB) 0.5-2.5 (3) MG/3ML nebulizer solution 3 mL (3 mLs Nebulization Given 07/25/20 9163)  methylPREDNISolone sodium succinate (SOLU-MEDROL) 125 mg/2 mL injection 125 mg (125 mg Intravenous Given 07/25/20 8466)     ED Discharge Orders    None      *Please note:  Stacey Gross was evaluated in Emergency Department on 07/25/2020 for the symptoms described in the history of present illness. She was evaluated in the context of the global COVID-19 pandemic, which necessitated consideration that the patient might be at risk for infection with the SARS-CoV-2 virus that causes COVID-19. Institutional protocols and algorithms that pertain to the evaluation of patients at risk for COVID-19 are in a state of rapid change based on information released by regulatory bodies including the CDC and federal  and state organizations. These policies and algorithms were followed during the patient's care in the ED.  Some ED evaluations and interventions may be delayed as a result of limited staffing during and after the pandemic.*  Note:  This document was prepared using Dragon voice recognition software and may include unintentional dictation errors.   Hinda Kehr, MD 07/25/20 [phone]

## 2020-07-25 NOTE — Plan of Care (Signed)

## 2020-07-25 NOTE — ED Triage Notes (Addendum)
0300 cp and shob, chills, productive green/yellow. Initial sat 86% RA, does not wear oxygen at home. Symptoms started tues, virtual visit prescribed zpak and prednisone. Is not covid vaccinated

## 2020-07-26 LAB — GLUCOSE, CAPILLARY
Glucose-Capillary: 297 mg/dL — ABNORMAL HIGH (ref 70–99)
Glucose-Capillary: 347 mg/dL — ABNORMAL HIGH (ref 70–99)
Glucose-Capillary: 304 mg/dL — ABNORMAL HIGH (ref 70–99)
Glucose-Capillary: 233 mg/dL — ABNORMAL HIGH (ref 70–99)

## 2020-07-26 LAB — CBC
HCT: 35.4 % — ABNORMAL LOW (ref 36.0–46.0)
Hemoglobin: 11.6 g/dL — ABNORMAL LOW (ref 12.0–15.0)
MCH: 30 pg (ref 26.0–34.0)
MCHC: 32.8 g/dL (ref 30.0–36.0)
MCV: 91.5 fL (ref 80.0–100.0)
Platelets: 313 10*3/uL (ref 150–400)
RBC: 3.87 MIL/uL (ref 3.87–5.11)
RDW: 12.5 % (ref 11.5–15.5)
WBC: 17.7 10*3/uL — ABNORMAL HIGH (ref 4.0–10.5)
nRBC: 0 % (ref 0.0–0.2)

## 2020-07-26 LAB — BASIC METABOLIC PANEL
Anion gap: 8 (ref 5–15)
BUN: 15 mg/dL (ref 8–23)
CO2: 27 mmol/L (ref 22–32)
Calcium: 9.1 mg/dL (ref 8.9–10.3)
Chloride: 103 mmol/L (ref 98–111)
Creatinine, Ser: 0.8 mg/dL (ref 0.44–1.00)
GFR, Estimated: >60 mL/min (ref >60)
Glucose, Bld: 183 mg/dL — ABNORMAL HIGH (ref 70–99)
Potassium: 4.7 mmol/L (ref 3.5–5.1)
Sodium: 138 mmol/L (ref 135–145)

## 2020-07-26 LAB — HEMOGLOBIN A1C
Hgb A1c MFr Bld: 7.1 % — ABNORMAL HIGH (ref 4.8–5.6)
Mean Plasma Glucose: 157.07 mg/dL

## 2020-07-26 MED ORDER — INSULIN GLARGINE 100 UNIT/ML ~~LOC~~ SOLN
6.0000 [IU] | Freq: Every day | SUBCUTANEOUS | Status: DC
  Administered 2020-07-26: 13:00:00 6 [IU] via SUBCUTANEOUS
  Filled 2020-07-26 (×2): qty 0.06

## 2020-07-26 MED ORDER — METHYLPREDNISOLONE SODIUM SUCC 40 MG IJ SOLR
40.0000 mg | Freq: Three times a day (TID) | INTRAMUSCULAR | Status: DC
  Administered 2020-07-26 – 2020-07-28 (×6): 40 mg via INTRAVENOUS
  Filled 2020-07-26 (×6): qty 1

## 2020-07-26 NOTE — Progress Notes (Signed)
PROGRESS NOTE                                                                             PROGRESS NOTE                                                                                                                                                                                                             Patient Demographics:    Stacey Gross, is a 60 y.o. female, DOB - 12/22/1960, ZU:[id number]  Outpatient Primary MD for the patient is Venita Lick, NP    LOS - 1  Admit date - 07/25/2020    No chief complaint on file.      Brief Narrative    Stacey Gross is a 60 y.o. female with medical history significant of hypertension, hyperlipidemia, diabetes mellitus, COPD/asthma, GERD, depression, anxiety, tobacco abuse, PVD, stomach cancer, who presents with shortness breath and cough.  Work-up significant for COPD exacerbation and new oxygen requirement, she was admitted for further management.   Subjective:    Stacey Gross today reports dyspnea, cough, she denies any chest pain   Assessment  & Plan :    Principal Problem:   COPD exacerbation ([place]) Active Problems:   Hyperlipidemia associated with type 2 diabetes mellitus ([place])   Hypertension associated with diabetes ([place])   GERD (gastroesophageal reflux disease)   Depression   Type 2 diabetes, controlled, with peripheral circulatory disorder (HCC)   Acute respiratory failure with hypoxia (HCC)   Tobacco abuse   PVD (peripheral vascular disease) (HCC)   Sepsis (HCC)     Acute respiratory failure with hypoxia  due to COPD exacerbation:  -She is with no Juliane requirement at baseline, she is currently requiring 2 L nasal cannula at rest, she did desaturate to 85% on room air today on my evaluation . -Presents of pneumonia on chest x-ray, but she reports significant cough, productive with phlegm, continue with IV Rocephin and azithromycin . -Given her significant wheezing we will  increase her IV Solu-Medrol to 40  mg IV every 8 hours, continue with scheduled duo nebs . -She was encouraged use incentive spirometry and flutter valve   Hyperlipidemia associated with type 2 diabetes mellitus ([place]) -Crestor  Hypertension associated with diabetes ([place]) -IV hydralazine as needed -Continue Cozaar, metoprolol  GERD (gastroesophageal reflux disease) -Protonix  Depression -Continue home medication, Celexa  Type 2 diabetes, controlled, with peripheral circulatory disorder ([place]):  - Recent A1c 6.6, well controlled.  Patient taking Ozempic and metformin -Sliding scale insulin  Tobacco abuse -Nicotine patch, she is counseled and reports she is determined to quit smoking.  PVD (peripheral vascular disease) (HCC) -Cilostazol    SpO2: 96 % O2 Flow Rate (L/min): 2 L/min  Recent Labs  Lab 07/25/20 0618 07/25/20 0850 07/26/20 0428  WBC 19.8*  --  17.7*  PLT 313  --  313  BNP 120.3*  --   --   PROCALCITON  --  <0.10  --   AST 26  --   --   ALT 19  --   --   ALKPHOS 85  --   --   BILITOT 0.8  --   --   ALBUMIN 3.7  --   --   INR 1.1  --   --   LATICACIDVEN 2.0* 1.9  --   SARSCOV2NAA NEGATIVE  --   --        ABG     Component Value Date/Time   HCO3 23.3 02/01/2019 1118   O2SAT 86.2 02/01/2019 1118         Condition - Extremely Guarded  Family Communication  :  D/W patient  Code Status :  Full  Consults  :  none  Disposition Plan  :    Status is: Inpatient  Remains inpatient appropriate because:IV treatments appropriate due to intensity of illness or inability to take PO   Dispo: The patient is from: Home              Anticipated d/c is to: Home              Patient currently is not medically stable to d/c.   Difficult to place patient No      DVT Prophylaxis  :  Lovenox   Lab Results  Component Value Date   PLT 313 07/26/2020    Diet :  Diet Order            Diet heart healthy/carb modified Room service  appropriate? Yes; Fluid consistency: Thin  Diet effective now                  Inpatient Medications  Scheduled Meds: . cilostazol  100 mg Oral BID AC  . citalopram  20 mg Oral Daily  . enoxaparin (LOVENOX) injection  40 mg Subcutaneous Q24H  . insulin aspart  0-20 Units Subcutaneous TID AC & HS  . ipratropium-albuterol  3 mL Nebulization Q6H  . losartan  25 mg Oral QPM  . methylPREDNISolone (SOLU-MEDROL) injection  40 mg Intravenous Q8H  . nicotine  21 mg Transdermal Daily  . pantoprazole  40 mg Oral Daily  . rosuvastatin  20 mg Oral Daily  . vitamin B-12  1,000 mcg Oral Daily   Continuous Infusions: . azithromycin 500 mg (07/26/20 1610)  . cefTRIAXone (ROCEPHIN)  IV 2 g (07/26/20 0543)   PRN Meds:.albuterol, cyclobenzaprine, dextromethorphan-guaiFENesin, hydrALAZINE, HYDROcodone-acetaminophen, hydrOXYzine, ondansetron (ZOFRAN) IV, traZODone  Antibiotics  :    Anti-infectives (From admission, onward)   Start     Dose/Rate  Route Frequency Ordered Stop   07/25/20 0645  cefTRIAXone (ROCEPHIN) 2 g in sodium chloride 0.9 % 100 mL IVPB        2 g 200 mL/hr over 30 Minutes Intravenous Every 24 hours 07/25/20 0636     07/25/20 0645  azithromycin (ZITHROMAX) 500 mg in sodium chloride 0.9 % 250 mL IVPB        500 mg 250 mL/hr over 60 Minutes Intravenous Every 24 hours 07/25/20 0636          Phillips Climes M.D on 07/26/2020 at 11:23 AM  To page go to [email or web address]   [facility] -  Office  [phone]       Objective:   Vitals:   07/26/20 0044 07/26/20 0431 07/26/20 0726 07/26/20 0752  BP: 121/62 134/81  (!) 143/78  Pulse: 85 70  60  Resp: 20 18  16   Temp: 98.4 F (36.9 C) 97.6 F (36.4 C)  97.8 F (36.6 C)  TempSrc: Oral Oral    SpO2: 93% 93% 96%   Weight:      Height:        Wt Readings from Last 3 Encounters:  07/25/20 94.3 kg  06/05/20 95.7 kg  05/15/20 99.8 kg     Intake/Output Summary (Last 24 hours) at 07/26/2020 1123 Last data filed  at 07/26/2020 1113 Gross per 24 hour  Intake 720 ml  Output --  Net 720 ml     Physical Exam  Awake Alert, No new F.N deficits, Normal affect, in mild respiratory distress, unable to finish full sentences Symmetrical Chest wall movement, diminished air entry bilaterally with diffuse wheezing RRR,No Gallops,Rubs or new Murmurs, No Parasternal Heave +ve B.Sounds, Abd Soft, No tenderness, No organomegaly appriciated, No rebound - guarding or rigidity. No Cyanosis, Clubbing or edema, No new Rash or bruise     Data Review:    CBC Recent Labs  Lab 07/25/20 0618 07/26/20 0428  WBC 19.8* 17.7*  HGB 13.6 11.6*  HCT 40.4 35.4*  PLT 313 313  MCV 91.0 91.5  MCH 30.6 30.0  MCHC 33.7 32.8  RDW 12.7 12.5  LYMPHSABS 2.6  --   MONOABS 1.3*  --   EOSABS 0.0  --   BASOSABS 0.1  --     Recent Labs  Lab 07/25/20 0618 07/25/20 0850 07/25/20 1100 07/26/20 0428  NA 132*  --   --  138  K 3.8  --   --  4.7  CL 95*  --   --  103  CO2 23  --   --  27  GLUCOSE 325*  --   --  183*  BUN 15  --   --  15  CREATININE 1.04*  --   --  0.80  CALCIUM 9.0  --   --  9.1  AST 26  --   --   --   ALT 19  --   --   --   ALKPHOS 85  --   --   --   BILITOT 0.8  --   --   --   ALBUMIN 3.7  --   --   --   PROCALCITON  --  <0.10  --   --   LATICACIDVEN 2.0* 1.9  --   --   INR 1.1  --   --   --   HGBA1C  --   --  7.1*  --   BNP 120.3*  --   --   --     ------------------------------------------------------------------------------------------------------------------  No results for input(s): CHOL, HDL, LDLCALC, TRIG, CHOLHDL, LDLDIRECT in the last 72 hours.  Lab Results  Component Value Date   HGBA1C 7.1 (H) 07/25/2020   ------------------------------------------------------------------------------------------------------------------ No results for input(s): TSH, T4TOTAL, T3FREE, THYROIDAB in the last 72 hours.  Invalid input(s): FREET3  Cardiac Enzymes No results for input(s): CKMB,  TROPONINI, MYOGLOBIN in the last 168 hours.  Invalid input(s): CK ------------------------------------------------------------------------------------------------------------------    Component Value Date/Time   BNP 120.3 (H) 07/25/2020 6979    Micro Results Recent Results (from the past 240 hour(s))  Resp Panel by RT-PCR (Flu A&B, Covid) Nasopharyngeal Swab     Status: None   Collection Time: 07/25/20  6:18 AM   Specimen: Nasopharyngeal Swab; Nasopharyngeal(NP) swabs in vial transport medium  Result Value Ref Range Status   SARS Coronavirus 2 by RT PCR NEGATIVE NEGATIVE Final    Comment: (NOTE) SARS-CoV-2 target nucleic acids are NOT DETECTED.  The SARS-CoV-2 RNA is generally detectable in upper respiratory specimens during the acute phase of infection. The lowest concentration of SARS-CoV-2 viral copies this assay can detect is 138 copies/mL. A negative result does not preclude SARS-Cov-2 infection and should not be used as the sole basis for treatment or other patient management decisions. A negative result may occur with  improper specimen collection/handling, submission of specimen other than nasopharyngeal swab, presence of viral mutation(s) within the areas targeted by this assay, and inadequate number of viral copies(<138 copies/mL). A negative result must be combined with clinical observations, patient history, and epidemiological information. The expected result is Negative.  Fact Sheet for Patients:  BloggerCourse.com  Fact Sheet for Healthcare Providers:  SeriousBroker.it  This test is no t yet approved or cleared by the [place] FDA and  has been authorized for detection and/or diagnosis of SARS-CoV-2 by FDA under an Emergency Use Authorization (EUA). This EUA will remain  in effect (meaning this test can be used) for the duration of the COVID-19 declaration under Section 564(b)(1) of the Act,  21 U.S.C.section 360bbb-3(b)(1), unless the authorization is terminated  or revoked sooner.       Influenza A by PCR NEGATIVE NEGATIVE Final   Influenza B by PCR NEGATIVE NEGATIVE Final    Comment: (NOTE) The Xpert Xpress SARS-CoV-2/FLU/RSV plus assay is intended as an aid in the diagnosis of influenza from Nasopharyngeal swab specimens and should not be used as a sole basis for treatment. Nasal washings and aspirates are unacceptable for Xpert Xpress SARS-CoV-2/FLU/RSV testing.  Fact Sheet for Patients: BloggerCourse.com  Fact Sheet for Healthcare Providers: SeriousBroker.it  This test is not yet approved or cleared by the [place] FDA and has been authorized for detection and/or diagnosis of SARS-CoV-2 by FDA under an Emergency Use Authorization (EUA). This EUA will remain in effect (meaning this test can be used) for the duration of the COVID-19 declaration under Section 564(b)(1) of the Act, 21 U.S.C. section 360bbb-3(b)(1), unless the authorization is terminated or revoked.  Performed at [facility], [address] [place], [place] [address]   Blood culture (routine single)     Status: None (Preliminary result)   Collection Time: 07/25/20  6:18 AM   Specimen: BLOOD  Result Value Ref Range Status   Specimen Description BLOOD LEFT ANTECUBITAL  Final   Special Requests   Final    BOTTLES DRAWN AEROBIC AND ANAEROBIC Blood Culture results may not be optimal due to an excessive volume of blood received in culture bottles   Culture   Final  NO GROWTH < 24 HOURS Performed at [facility], [address] [place], [place] [address]    Report Status PENDING  Incomplete  Culture, blood (Routine X 2) w Reflex to ID Panel     Status: None (Preliminary result)   Collection Time: 07/25/20  7:41 AM   Specimen: BLOOD  Result Value Ref Range Status   Specimen Description BLOOD BLOOD LEFT HAND  Final    Special Requests   Final    BOTTLES DRAWN AEROBIC AND ANAEROBIC Blood Culture adequate volume   Culture   Final    NO GROWTH < 24 HOURS Performed at [facility], [address] [place], [place] [address]    Report Status PENDING  Incomplete    Radiology Reports DG Chest Port 1 View  Result Date: 07/25/2020 CLINICAL DATA:  Questionable sepsis. Unspecified chest pain and shortness of breath. EXAM: PORTABLE CHEST 1 VIEW COMPARISON:  Chest x-ray 02/01/2019, CT chest 06/05/2020 FINDINGS: Emphysematous changes. The heart size and mediastinal contours are within normal limits. Again noted is mild peribronchial cuffing. Emphysematous changes with biapical pleural/pulmonary scarring. No focal consolidation. No pulmonary edema. No pleural effusion. No pneumothorax. No acute osseous abnormality. IMPRESSION: 1. No active disease. 2.  Emphysema (ICD10-J43.9). Electronically Signed   By: Iven Finn M.D.   On: 07/25/2020 07:00

## 2020-07-26 NOTE — Progress Notes (Signed)
Pt refusing continuous pulse-ox.

## 2020-07-26 NOTE — Plan of Care (Signed)

## 2020-07-26 NOTE — Progress Notes (Signed)
Patient with persistent wet loose non productive cough, and bilateral wheezing. Patient states she was utilizing otc mucinex to help break up secretions with very little success. SVN given. Text message NP for flutter order. Patient able to administer w/o difficulty. States could immediately feel secretions move in her lungs. Will continue to monitor

## 2020-07-27 LAB — GLUCOSE, CAPILLARY
Glucose-Capillary: 247 mg/dL — ABNORMAL HIGH (ref 70–99)
Glucose-Capillary: 326 mg/dL — ABNORMAL HIGH (ref 70–99)
Glucose-Capillary: 261 mg/dL — ABNORMAL HIGH (ref 70–99)
Glucose-Capillary: 189 mg/dL — ABNORMAL HIGH (ref 70–99)

## 2020-07-27 LAB — BASIC METABOLIC PANEL
Anion gap: 10 (ref 5–15)
BUN: 26 mg/dL — ABNORMAL HIGH (ref 8–23)
CO2: 27 mmol/L (ref 22–32)
Calcium: 9.2 mg/dL (ref 8.9–10.3)
Chloride: 99 mmol/L (ref 98–111)
Creatinine, Ser: 0.96 mg/dL (ref 0.44–1.00)
GFR, Estimated: >60 mL/min (ref >60)
Glucose, Bld: 254 mg/dL — ABNORMAL HIGH (ref 70–99)
Potassium: 4.2 mmol/L (ref 3.5–5.1)
Sodium: 136 mmol/L (ref 135–145)

## 2020-07-27 LAB — CBC
HCT: 38.2 % (ref 36.0–46.0)
Hemoglobin: 12.7 g/dL (ref 12.0–15.0)
MCH: 30 pg (ref 26.0–34.0)
MCHC: 33.2 g/dL (ref 30.0–36.0)
MCV: 90.3 fL (ref 80.0–100.0)
Platelets: 365 10*3/uL (ref 150–400)
RBC: 4.23 MIL/uL (ref 3.87–5.11)
RDW: 12.4 % (ref 11.5–15.5)
WBC: 19 10*3/uL — ABNORMAL HIGH (ref 4.0–10.5)
nRBC: 0 % (ref 0.0–0.2)

## 2020-07-27 MED ORDER — AMLODIPINE BESYLATE 10 MG PO TABS
10.0000 mg | ORAL_TABLET | Freq: Every day | ORAL | Status: DC
  Administered 2020-07-27 – 2020-07-28 (×2): 10 mg via ORAL
  Filled 2020-07-27 (×2): qty 1

## 2020-07-27 MED ORDER — INSULIN ASPART 100 UNIT/ML ~~LOC~~ SOLN
5.0000 [IU] | Freq: Three times a day (TID) | SUBCUTANEOUS | Status: DC
  Administered 2020-07-27 – 2020-07-28 (×2): 5 [IU] via SUBCUTANEOUS
  Filled 2020-07-27 (×2): qty 1

## 2020-07-27 MED ORDER — INSULIN GLARGINE 100 UNIT/ML ~~LOC~~ SOLN
10.0000 [IU] | Freq: Every day | SUBCUTANEOUS | Status: DC
  Administered 2020-07-27: 10:00:00 10 [IU] via SUBCUTANEOUS
  Filled 2020-07-27: qty 0.1

## 2020-07-27 MED ORDER — INSULIN GLARGINE 100 UNIT/ML ~~LOC~~ SOLN
12.0000 [IU] | Freq: Every day | SUBCUTANEOUS | Status: DC
  Administered 2020-07-28: 12 [IU] via SUBCUTANEOUS
  Filled 2020-07-27: qty 0.12

## 2020-07-27 NOTE — Progress Notes (Signed)
Inpatient Diabetes Program Recommendations  AACE/ADA: New Consensus Statement on Inpatient Glycemic Control   Target Ranges:  Prepandial:   less than 140 mg/dL      Peak postprandial:   less than 180 mg/dL (1-2 hours)      Critically ill patients:  140 - 180 mg/dL   Results for RINDI, BEECHY (MRN [id number]) as of 07/27/2020 12:19  Ref. Range 07/26/2020 08:46 07/26/2020 13:07 07/26/2020 16:20 07/26/2020 21:14 07/27/2020 08:02 07/27/2020 11:21  Glucose-Capillary Latest Ref Range: 70 - 99 mg/dL 297 (H) 347 (H) 304 (H) 233 (H) 247 (H) 326 (H)   Review of Glycemic Control  Diabetes history: DM2 Outpatient Diabetes medications: Metformin 500 mg BID, Ozempic 0.5 mg Qweek (Tuesday) Current orders for Inpatient glycemic control: Lantus 10 units daily, Novolog 0-20 units AC&HS; Solumedrol 40 mg Q8H  Inpatient Diabetes Program Recommendations:    Insulin: If steroids are continued, please consider increasing Lantus to 13 units daily and ordering Novolog 6 units TID with meals for meal coverage if patient eats at least 50% of meals.  Thanks, Barnie Alderman, RN, MSN, CDE Diabetes Coordinator Inpatient Diabetes Program [phone] (Team Pager from 8am to 5pm)

## 2020-07-27 NOTE — Progress Notes (Signed)
  Patient Saturations on Room Air at Rest = 94%  Patient Saturations on [employer] while Ambulating = 72%  Patient Saturations on 4 Liters of oxygen while Ambulating = 91%  Patient able to ambulate 160 feet.  Started on room air and her O2 sats dropped to 72% with SOB.  O2 turned up to 4L and patient recovered to 91% with 1 minute rest period.  Able to ambulate the remaining distance without SOB or increased wob.

## 2020-07-27 NOTE — Progress Notes (Signed)
Pt sating 92%on 3L/[place]. Pt ambulated 50 feet without oxygen. Sats dropped to 80%. Pt ambulated back to room and oxygen reapplied. Pt does not use oxygen at home.

## 2020-07-27 NOTE — Progress Notes (Signed)
PROGRESS NOTE                                                                             PROGRESS NOTE                                                                                                                                                                                                             Patient Demographics:    Stacey Gross, is a 60 y.o. female, DOB - 07/14/1960, ZTI:[id number]  Outpatient Primary MD for the patient is Venita Lick, NP    LOS - 2  Admit date - 07/25/2020    No chief complaint on file.      Brief Narrative    Stacey Gross is a 60 y.o. female with medical history significant of hypertension, hyperlipidemia, diabetes mellitus, COPD/asthma, GERD, depression, anxiety, tobacco abuse, PVD, stomach cancer, who presents with shortness breath and cough.  Work-up significant for COPD exacerbation and new oxygen requirement, she was admitted for further management.   Subjective:    Stacey Gross today ports feeling better, but still reports significant dyspnea with activity, no chest pain, no fever.     Assessment  & Plan :    Principal Problem:   COPD exacerbation ([place]) Active Problems:   Hyperlipidemia associated with type 2 diabetes mellitus ([place])   Hypertension associated with diabetes ([place])   GERD (gastroesophageal reflux disease)   Depression   Type 2 diabetes, controlled, with peripheral circulatory disorder (HCC)   Acute respiratory failure with hypoxia (HCC)   Tobacco abuse   PVD (peripheral vascular disease) (HCC)   Sepsis ([place])     Acute respiratory failure with hypoxia  due to COPD exacerbation:  -She is with no oxygen requirement at baseline, she is currently requiring 2 L nasal cannula at rest, and up to 4 L with activity. Likely she will need home oxygen on discharge -Presents of pneumonia on chest x-ray, but she reports significant cough, productive with phlegm, continue with IV Rocephin  and azithromycin . -She  remains with significant wheezing, continue with IV Solu-Medrol at current dose of 40 mg IV every 8 hours, continue with scheduled duo nebs  -She was encouraged use incentive spirometry and flutter valve   Hyperlipidemia associated with type 2 diabetes mellitus ([place]) -Crestor  Hypertension associated with diabetes ([place]) -IV hydralazine as needed -Continue Cozaar, metoprolol -Blood pressure remains elevated, will add Norvasc 10 mg oral daily  GERD (gastroesophageal reflux disease) -Protonix  Depression -Continue home medication, Celexa  Type 2 diabetes, controlled, with peripheral circulatory disorder ([facility]):  - Recent A1c 6.6, well controlled.  Patient taking Ozempic and metformin -Her CBG is currently uncontrolled due to steroids, she was started on extra sliding scale, uptitrating her Lantus as well.  Tobacco abuse -Nicotine patch, she is counseled and reports she is determined to quit smoking.  PVD (peripheral vascular disease) (HCC) -Cilostazol    SpO2: 94 % O2 Flow Rate (L/min): 2 L/min  Recent Labs  Lab 07/25/20 0618 07/25/20 0850 07/26/20 0428 07/27/20 0429  WBC 19.8*  --  17.7* 19.0*  PLT 313  --  313 365  BNP 120.3*  --   --   --   PROCALCITON  --  <0.10  --   --   AST 26  --   --   --   ALT 19  --   --   --   ALKPHOS 85  --   --   --   BILITOT 0.8  --   --   --   ALBUMIN 3.7  --   --   --   INR 1.1  --   --   --   LATICACIDVEN 2.0* 1.9  --   --   SARSCOV2NAA NEGATIVE  --   --   --        ABG     Component Value Date/Time   HCO3 23.3 02/01/2019 1118   O2SAT 86.2 02/01/2019 1118         Condition - Extremely Guarded  Family Communication  :  D/W patient  Code Status :  Full  Consults  :  none  Disposition Plan  :    Status is: Inpatient  Remains inpatient appropriate because:IV treatments appropriate due to intensity of illness or inability to take PO   Dispo: The patient is from: Home               Anticipated d/c is to: Home              Patient currently is not medically stable to d/c.   Difficult to place patient No      DVT Prophylaxis  :  Lovenox   Lab Results  Component Value Date   PLT 365 07/27/2020    Diet :  Diet Order            Diet heart healthy/carb modified Room service appropriate? Yes; Fluid consistency: Thin  Diet effective now                  Inpatient Medications  Scheduled Meds: . cilostazol  100 mg Oral BID AC  . citalopram  20 mg Oral Daily  . enoxaparin (LOVENOX) injection  40 mg Subcutaneous Q24H  . insulin aspart  0-20 Units Subcutaneous TID AC & HS  . insulin aspart  5 Units Subcutaneous TID WC  . insulin glargine  10 Units Subcutaneous Daily  . ipratropium-albuterol  3 mL Nebulization Q6H  . losartan  25 mg Oral QPM  .  methylPREDNISolone (SOLU-MEDROL) injection  40 mg Intravenous Q8H  . nicotine  21 mg Transdermal Daily  . pantoprazole  40 mg Oral Daily  . rosuvastatin  20 mg Oral Daily  . vitamin B-12  1,000 mcg Oral Daily   Continuous Infusions: . azithromycin 500 mg (07/27/20 0606)  . cefTRIAXone (ROCEPHIN)  IV 2 g (07/27/20 0531)   PRN Meds:.albuterol, cyclobenzaprine, dextromethorphan-guaiFENesin, hydrALAZINE, HYDROcodone-acetaminophen, hydrOXYzine, ondansetron (ZOFRAN) IV, traZODone  Antibiotics  :    Anti-infectives (From admission, onward)   Start     Dose/Rate Route Frequency Ordered Stop   07/25/20 0645  cefTRIAXone (ROCEPHIN) 2 g in sodium chloride 0.9 % 100 mL IVPB        2 g 200 mL/hr over 30 Minutes Intravenous Every 24 hours 07/25/20 0636     07/25/20 0645  azithromycin (ZITHROMAX) 500 mg in sodium chloride 0.9 % 250 mL IVPB        500 mg 250 mL/hr over 60 Minutes Intravenous Every 24 hours 07/25/20 0636          Phillips Climes M.D on 07/27/2020 at 12:52 PM  To page go to [email or web address]   [facility] -  Office  [phone]       Objective:   Vitals:   07/27/20 0534 07/27/20 0736  07/27/20 0806 07/27/20 1123  BP: (!) 173/94  (!) 179/87 (!) 161/87  Pulse: 73  97 73  Resp: 18  15 15   Temp: 98 F (36.7 C)  97.9 F (36.6 C) 97.9 F (36.6 C)  TempSrc: Oral     SpO2: 92% 94% 94% 94%  Weight:      Height:        Wt Readings from Last 3 Encounters:  07/25/20 94.3 kg  06/05/20 95.7 kg  05/15/20 99.8 kg     Intake/Output Summary (Last 24 hours) at 07/27/2020 1252 Last data filed at 07/27/2020 0954 Gross per 24 hour  Intake 480 ml  Output --  Net 480 ml     Physical Exam  Awake Alert, Oriented X 3, No new F.N deficits, Normal affect Symmetrical Chest wall movement, air entry has improved, she remains with significant wheezing , but there is improving as well. RRR,No Gallops,Rubs or new Murmurs, No Parasternal Heave +ve B.Sounds, Abd Soft, No tenderness, No rebound - guarding or rigidity. No Cyanosis, Clubbing or edema, No new Rash or bruise      Data Review:    CBC Recent Labs  Lab 07/25/20 0618 07/26/20 0428 07/27/20 0429  WBC 19.8* 17.7* 19.0*  HGB 13.6 11.6* 12.7  HCT 40.4 35.4* 38.2  PLT 313 313 365  MCV 91.0 91.5 90.3  MCH 30.6 30.0 30.0  MCHC 33.7 32.8 33.2  RDW 12.7 12.5 12.4  LYMPHSABS 2.6  --   --   MONOABS 1.3*  --   --   EOSABS 0.0  --   --   BASOSABS 0.1  --   --     Recent Labs  Lab 07/25/20 0618 07/25/20 0850 07/25/20 1100 07/26/20 0428 07/27/20 0429  NA 132*  --   --  138 136  K 3.8  --   --  4.7 4.2  CL 95*  --   --  103 99  CO2 23  --   --  27 27  GLUCOSE 325*  --   --  183* 254*  BUN 15  --   --  15 26*  CREATININE 1.04*  --   --  0.80 0.96  CALCIUM 9.0  --   --  9.1 9.2  AST 26  --   --   --   --   ALT 19  --   --   --   --   ALKPHOS 85  --   --   --   --   BILITOT 0.8  --   --   --   --   ALBUMIN 3.7  --   --   --   --   PROCALCITON  --  <0.10  --   --   --   LATICACIDVEN 2.0* 1.9  --   --   --   INR 1.1  --   --   --   --   HGBA1C  --   --  7.1*  --   --   BNP 120.3*  --   --   --   --      ------------------------------------------------------------------------------------------------------------------ No results for input(s): CHOL, HDL, LDLCALC, TRIG, CHOLHDL, LDLDIRECT in the last 72 hours.  Lab Results  Component Value Date   HGBA1C 7.1 (H) 07/25/2020   ------------------------------------------------------------------------------------------------------------------ No results for input(s): TSH, T4TOTAL, T3FREE, THYROIDAB in the last 72 hours.  Invalid input(s): FREET3  Cardiac Enzymes No results for input(s): CKMB, TROPONINI, MYOGLOBIN in the last 168 hours.  Invalid input(s): CK ------------------------------------------------------------------------------------------------------------------    Component Value Date/Time   BNP 120.3 (H) 07/25/2020 [id number]    Micro Results Recent Results (from the past 240 hour(s))  Resp Panel by RT-PCR (Flu A&B, Covid) Nasopharyngeal Swab     Status: None   Collection Time: 07/25/20  6:18 AM   Specimen: Nasopharyngeal Swab; Nasopharyngeal(NP) swabs in vial transport medium  Result Value Ref Range Status   SARS Coronavirus 2 by RT PCR NEGATIVE NEGATIVE Final    Comment: (NOTE) SARS-CoV-2 target nucleic acids are NOT DETECTED.  The SARS-CoV-2 RNA is generally detectable in upper respiratory specimens during the acute phase of infection. The lowest concentration of SARS-CoV-2 viral copies this assay can detect is 138 copies/mL. A negative result does not preclude SARS-Cov-2 infection and should not be used as the sole basis for treatment or other patient management decisions. A negative result may occur with  improper specimen collection/handling, submission of specimen other than nasopharyngeal swab, presence of viral mutation(s) within the areas targeted by this assay, and inadequate number of viral copies(<138 copies/mL). A negative result must be combined with clinical observations, patient history, and  epidemiological information. The expected result is Negative.  Fact Sheet for Patients:  EntrepreneurPulse.com.au  Fact Sheet for Healthcare Providers:  IncredibleEmployment.be  This test is no t yet approved or cleared by the [place] FDA and  has been authorized for detection and/or diagnosis of SARS-CoV-2 by FDA under an Emergency Use Authorization (EUA). This EUA will remain  in effect (meaning this test can be used) for the duration of the COVID-19 declaration under Section 564(b)(1) of the Act, 21 U.S.C.section 360bbb-3(b)(1), unless the authorization is terminated  or revoked sooner.       Influenza A by PCR NEGATIVE NEGATIVE Final   Influenza B by PCR NEGATIVE NEGATIVE Final    Comment: (NOTE) The Xpert Xpress SARS-CoV-2/FLU/RSV plus assay is intended as an aid in the diagnosis of influenza from Nasopharyngeal swab specimens and should not be used as a sole basis for treatment. Nasal washings and aspirates are unacceptable for Xpert Xpress SARS-CoV-2/FLU/RSV testing.  Fact Sheet for Patients: EntrepreneurPulse.com.au  Fact Sheet for Healthcare Providers: IncredibleEmployment.be  This test is not yet approved or cleared by the [place] and has been authorized for detection and/or diagnosis of SARS-CoV-2 by FDA under an Emergency Use Authorization (EUA). This EUA will remain in effect (meaning this test can be used) for the duration of the COVID-19 declaration under Section 564(b)(1) of the Act, 21 U.S.C. section 360bbb-3(b)(1), unless the authorization is terminated or revoked.  Performed at [facility], [address] [place], [place] [address]   Blood culture (routine single)     Status: None (Preliminary result)   Collection Time: 07/25/20  6:18 AM   Specimen: BLOOD  Result Value Ref Range Status   Specimen Description BLOOD LEFT ANTECUBITAL  Final   Special  Requests   Final    BOTTLES DRAWN AEROBIC AND ANAEROBIC Blood Culture results may not be optimal due to an excessive volume of blood received in culture bottles   Culture   Final    NO GROWTH 2 DAYS Performed at [facility], [address], [place], [place] [address]    Report Status PENDING  Incomplete  Culture, blood (Routine X 2) w Reflex to ID Panel     Status: None (Preliminary result)   Collection Time: 07/25/20  7:41 AM   Specimen: BLOOD  Result Value Ref Range Status   Specimen Description BLOOD BLOOD LEFT HAND  Final   Special Requests   Final    BOTTLES DRAWN AEROBIC AND ANAEROBIC Blood Culture adequate volume   Culture   Final    NO GROWTH 2 DAYS Performed at [facility], [address] [place], [place] [address]    Report Status PENDING  Incomplete    Radiology Reports DG Chest Port 1 View  Result Date: 07/25/2020 CLINICAL DATA:  Questionable sepsis. Unspecified chest pain and shortness of breath. EXAM: PORTABLE CHEST 1 VIEW COMPARISON:  Chest x-ray 02/01/2019, CT chest 06/05/2020 FINDINGS: Emphysematous changes. The heart size and mediastinal contours are within normal limits. Again noted is mild peribronchial cuffing. Emphysematous changes with biapical pleural/pulmonary scarring. No focal consolidation. No pulmonary edema. No pleural effusion. No pneumothorax. No acute osseous abnormality. IMPRESSION: 1. No active disease. 2.  Emphysema (ICD10-J43.9). Electronically Signed   By: Iven Finn M.D.   On: 07/25/2020 07:00

## 2020-07-27 NOTE — Plan of Care (Signed)

## 2020-07-28 LAB — GLUCOSE, CAPILLARY: Glucose-Capillary: 261 mg/dL — ABNORMAL HIGH (ref 70–99)

## 2020-07-28 MED ORDER — AZITHROMYCIN 500 MG PO TABS: 500.0000 mg | ORAL_TABLET | Freq: Every day | ORAL | 0 refills | Status: AC

## 2020-07-28 MED ORDER — NICOTINE 21 MG/24HR TD PT24: 21.0000 mg | MEDICATED_PATCH | Freq: Every day | TRANSDERMAL | 0 refills | Status: DC

## 2020-07-28 MED ORDER — METFORMIN HCL 500 MG PO TABS: ORAL_TABLET | ORAL | 1 refills | Status: DC

## 2020-07-28 MED ORDER — PREDNISONE 10 MG PO TABS: ORAL_TABLET | ORAL | 0 refills | Status: DC

## 2020-07-28 MED ORDER — AMLODIPINE BESYLATE 10 MG PO TABS: 10.0000 mg | ORAL_TABLET | Freq: Every day | ORAL | 1 refills | Status: DC

## 2020-07-28 NOTE — Discharge Summary (Addendum)
Physician Discharge Summary  Stacey Gross:[id number] DOB: 1960-09-25 DOA: 07/25/2020  PCP: Venita Lick, NP  Admit date: 07/25/2020 Discharge date: 07/28/2020  Admitted From: Home Disposition:  Home  Recommendations for Outpatient Follow-up:  1. Follow up with PCP in 1-2 weeks 2. Please obtain BMP/CBC in one week  Home Health:NO Equipment/Devices:oxygen 2 L  Discharge Condition:Stable CODE STATUS:FULL Diet recommendation: Heart Healthy / Carb Modified   Brief/Interim Summary  Acute respiratory failure with hypoxia  due toCOPD exacerbation: -She is with no oxygen requirement at baseline, she will be discharged on home oxygen.   -Presents with no evidence of pneumonia on chest x-ray, but she reports significant cough, productive with phlegm, she was treated with IV steroids, IV Rocephin and azithromycin, with significant wheezing, remains on IV steroids during hospital stay, this has improved, she will be discharged on prolonged prednisone taper, already has nebulizer machine at home, home oxygen has been arranged on discharge, she was encouraged use incentive spirometry and flutter valve.  - Sepsis ruled out  Hyperlipidemia associated with type 2 diabetes mellitus ([place]) -Crestor  Hypertension associated with diabetes ([place]) -blood pressure remains elevated on home Cozaar, so she was started on 10 mg of Norvasc.  GERD (gastroesophageal reflux disease) -Protonix  Depression -Continue home medication,Celexa  Type 2 diabetes, controlled, with peripheral circulatory disorder ([place]):  - Recent A1c 6.6, well controlled. Patient taking Ozempic and metformin -Her CBG is currently uncontrolled due to steroids, required Lantus during hospital stay, she was instructed to increase her metformin for next week till she is off steroids.    Tobacco abuse -Nicotine patch, she is counseled and reports she is determined to quit smoking.  PVD (peripheral vascular disease)  (HCC) -Cilostazol   Discharge Diagnoses:  Principal Problem:   COPD exacerbation ([place]) Active Problems:   Hyperlipidemia associated with type 2 diabetes mellitus ([place])   Hypertension associated with diabetes ([place])   GERD (gastroesophageal reflux disease)   Depression   Type 2 diabetes, controlled, with peripheral circulatory disorder (HCC)   Acute respiratory failure with hypoxia (HCC)   Tobacco abuse   PVD (peripheral vascular disease) ([place])   Sepsis ([place])    Discharge Instructions  Discharge Instructions    Diet - low sodium heart healthy   Complete by: As directed    Discharge instructions   Complete by: As directed    Follow with Primary MD Marnee Guarneri T, NP in 7 days   Get CBC, CMP, 2 view Chest X ray checked  by Primary MD next visit.    Activity: As tolerated with Full fall precautions use walker/cane & assistance as needed   Disposition Home    Diet: Heart Healthy /carb modifed, with feeding assistance and aspiration precautions.  For Heart failure patients - Check your Weight same time everyday, if you gain over 2 pounds, or you develop in leg swelling, experience more shortness of breath or chest pain, call your Primary MD immediately. Follow Cardiac Low Salt Diet and 1.5 lit/day fluid restriction.   On your next visit with your primary care physician please Get Medicines reviewed and adjusted.   Please request your Prim.MD to go over all Hospital Tests and Procedure/Radiological results at the follow up, please get all Hospital records sent to your Prim MD by signing hospital release before you go home.   If you experience worsening of your admission symptoms, develop shortness of breath, life threatening emergency, suicidal or homicidal thoughts you must seek medical attention immediately by calling 911  or calling your MD immediately  if symptoms less severe.  You Must read complete instructions/literature along with all the possible adverse  reactions/side effects for all the Medicines you take and that have been prescribed to you. Take any new Medicines after you have completely understood and accpet all the possible adverse reactions/side effects.   Do not drive, operating heavy machinery, perform activities at heights, swimming or participation in water activities or provide baby sitting services if your were admitted for syncope or siezures until you have seen by Primary MD or a Neurologist and advised to do so again.  Do not drive when taking Pain medications.    Do not take more than prescribed Pain, Sleep and Anxiety Medications  Special Instructions: If you have smoked or chewed Tobacco  in the last 2 yrs please stop smoking, stop any regular Alcohol  and or any Recreational drug use.  Wear Seat belts while driving.   Please note  You were cared for by a hospitalist during your hospital stay. If you have any questions about your discharge medications or the care you received while you were in the hospital after you are discharged, you can call the unit and asked to speak with the hospitalist on call if the hospitalist that took care of you is not available. Once you are discharged, your primary care physician will handle any further medical issues. Please note that NO REFILLS for any discharge medications will be authorized once you are discharged, as it is imperative that you return to your primary care physician (or establish a relationship with a primary care physician if you do not have one) for your aftercare needs so that they can reassess your need for medications and monitor your lab values.   Increase activity slowly   Complete by: As directed      Allergies as of 07/28/2020      Reactions   Doxycycline Hyclate Anaphylaxis, Swelling, Rash   Acetaminophen    Aspirin    Latex Rash   Vancomycin Rash   All mycins      Medication List    STOP taking these medications   HYDROcodone-acetaminophen 5-325 MG  tablet Commonly known as: NORCO/VICODIN   ibuprofen 600 MG tablet Commonly known as: ADVIL   metoprolol tartrate 100 MG tablet Commonly known as: LOPRESSOR     TAKE these medications   albuterol (2.5 MG/3ML) 0.083% nebulizer solution Commonly known as: PROVENTIL USE 1 VIAL (3 ML = 2.5 MG TOTAL) BY NEBULIZATION EVERY 6 HOURS AS NEEDED FORWHEEZING OR SHORTNESS OF BREATH   albuterol 108 (90 Base) MCG/ACT inhaler Commonly known as: VENTOLIN HFA INHALE 2 PUFFS BY MOUTH INTO THE LUNGS EVERY 6 HOURS AS NEEDED FOR WHEEZING OR SHORTNESS OF BREATH   amLODipine 10 MG tablet Commonly known as: NORVASC Take 1 tablet (10 mg total) by mouth daily. Start taking on: July 29, 2020   azithromycin 500 MG tablet Commonly known as: Zithromax Take 1 tablet (500 mg total) by mouth daily for 2 days. Take 1 tablet daily for 3 days. What changed:   medication strength  how much to take  how to take this  when to take this  additional instructions   benzonatate 200 MG capsule Commonly known as: TESSALON Take 1 capsule (200 mg total) by mouth 2 (two) times daily as needed for cough.   Breztri Aerosphere 160-9-4.8 MCG/ACT Aero Generic drug: Budeson-Glycopyrrol-Formoterol Inhale 2 puffs into the lungs 2 (two) times daily.   chlorpheniramine-HYDROcodone 10-8 MG/5ML  Suer Commonly known as: Tussionex Pennkinetic ER Take 5 mLs by mouth every 12 (twelve) hours as needed for cough.   cilostazol 100 MG tablet Commonly known as: PLETAL Take 1 tablet (100 mg total) by mouth 2 (two) times daily before a meal.   citalopram 20 MG tablet Commonly known as: CELEXA Take 1 tablet (20 mg total) by mouth daily.   clobetasol 0.05 % external solution Commonly known as: TEMOVATE Patient to mix solution in 1 jar of CeraVe Cream. Apply to itchy rash twice daily until improved.   cyclobenzaprine 10 MG tablet Commonly known as: FLEXERIL TAKE ONE TABLET BY MOUTH AT BEDTIME What changed:   when to take  this  reasons to take this   docusate sodium 100 MG capsule Commonly known as: COLACE Take 100 mg by mouth daily as needed for mild constipation.   glucose blood test strip Use to check blood sugar three times a day.   hydrOXYzine 25 MG tablet Commonly known as: ATARAX/VISTARIL Take 1 tablet (25 mg total) by mouth as directed. Take 1-2 tablets by mouth every 4-6 hours as needed for itch. MAY MAKE DROWSY   losartan 25 MG tablet Commonly known as: COZAAR Take 1 tablet (25 mg total) by mouth daily. What changed: when to take this   meloxicam 15 MG tablet Commonly known as: MOBIC Take 1 tablet (15 mg total) by mouth daily.   metFORMIN 500 MG tablet Commonly known as: GLUCOPHAGE Please take 1000 mg oral twice daily for next 10 days, then decrease to 5:30 100 mg oral twice daily thereafter. What changed: See the new instructions.   nicotine 21 mg/24hr patch Commonly known as: NICODERM CQ - dosed in mg/24 hours Place 1 patch (21 mg total) onto the skin daily. Start taking on: July 29, 2020   ONE TOUCH ULTRA SYSTEM KIT w/Device Kit 1 kit by Does not apply route once. What changed: when to take this   Ozempic (0.25 or 0.5 MG/DOSE) 2 MG/1.5ML Sopn Generic drug: Semaglutide(0.25 or 0.5MG/DOS) INJECT 0.5 MG INTO THE SKIN ONCE A WEEK What changed: additional instructions   pantoprazole 40 MG tablet Commonly known as: PROTONIX Take 1 tablet (40 mg total) by mouth daily.   predniSONE 10 MG tablet Commonly known as: DELTASONE Please take 50 mg oral daily x3 days, then decrease to 30 mg oral daily x3 days, then 20 mg oral daily x 3 days, then 10 mg oral x2 days then stop. What changed:   medication strength  how much to take  how to take this  when to take this  additional instructions   rosuvastatin 20 MG tablet Commonly known as: Crestor Take 1 tablet (20 mg total) by mouth daily.   tiZANidine 4 MG tablet Commonly known as: ZANAFLEX Take 4 mg by mouth at  bedtime.   traZODone 100 MG tablet Commonly known as: DESYREL TAKE 1 TABLET BY MOUTH AT BEDTIME AS NEEDED FOR SLEEP   vitamin B-12 1000 MCG tablet Commonly known as: CYANOCOBALAMIN Take 1 tablet (1,000 mcg total) by mouth daily.            Durable Medical Equipment  (From admission, onward)         Start     Ordered   07/27/20 1411  For home use only DME oxygen  Once       Question Answer Comment  Length of Need Lifetime   Mode or (Route) Nasal cannula   Liters per Minute -2   Frequency Continuous (  stationary and portable oxygen unit needed)   Oxygen conserving device Yes   Oxygen delivery system Gas      07/27/20 1411   07/27/20 1255  For home use only DME Nebulizer machine  Once       Question Answer Comment  Patient needs a nebulizer to treat with the following condition COPD (chronic obstructive pulmonary disease) ([place])   Length of Need Lifetime      07/27/20 1255          Follow-up Information    Marnee Guarneri T, NP On 08/14/2020.   Specialty: Nurse Practitioner Why: at 2:20pm Contact information: [place] [address] [phone]              Allergies  Allergen Reactions  . Doxycycline Hyclate Anaphylaxis, Swelling and Rash  . Acetaminophen   . Aspirin   . Latex Rash  . Vancomycin Rash    All mycins    Consultations:  None   Procedures/Studies: DG Chest Port 1 View  Result Date: 07/25/2020 CLINICAL DATA:  Questionable sepsis. Unspecified chest pain and shortness of breath. EXAM: PORTABLE CHEST 1 VIEW COMPARISON:  Chest x-ray 02/01/2019, CT chest 06/05/2020 FINDINGS: Emphysematous changes. The heart size and mediastinal contours are within normal limits. Again noted is mild peribronchial cuffing. Emphysematous changes with biapical pleural/pulmonary scarring. No focal consolidation. No pulmonary edema. No pleural effusion. No pneumothorax. No acute osseous abnormality. IMPRESSION: 1. No active disease. 2.  Emphysema  (ICD10-J43.9). Electronically Signed   By: Iven Finn M.D.   On: 07/25/2020 07:00     Subjective:  Denies any fever, chills, reports cough has improved, dyspnea has improved as well, remains mainly with activity. Discharge Exam: Vitals:   07/28/20 0722 07/28/20 0749  BP: (!) 151/86   Pulse: 73   Resp: 17   Temp: 98.2 F (36.8 C)   SpO2: 93% 93%   Vitals:   07/27/20 2132 07/28/20 0353 07/28/20 0722 07/28/20 0749  BP: (!) 148/74 (!) 170/81 (!) 151/86   Pulse: 75 85 73   Resp: 18 (!) 22 17   Temp: 98.5 F (36.9 C) 97.9 F (36.6 C) 98.2 F (36.8 C)   TempSrc:   Oral   SpO2: 93% 93% 93% 93%  Weight:      Height:        General: Pt is alert, awake, not in acute distress Cardiovascular: RRR, S1/S2 +, no rubs, no gallops Respiratory: Good air entry today, remains with minimal wheezing, but significantly improved Abdominal: Soft, NT, ND, bowel sounds + Extremities: no edema, no cyanosis    The results of significant diagnostics from this hospitalization (including imaging, microbiology, ancillary and laboratory) are listed below for reference.     Microbiology: Recent Results (from the past 240 hour(s))  Resp Panel by RT-PCR (Flu A&B, Covid) Nasopharyngeal Swab     Status: None   Collection Time: 07/25/20  6:18 AM   Specimen: Nasopharyngeal Swab; Nasopharyngeal(NP) swabs in vial transport medium  Result Value Ref Range Status   SARS Coronavirus 2 by RT PCR NEGATIVE NEGATIVE Final    Comment: (NOTE) SARS-CoV-2 target nucleic acids are NOT DETECTED.  The SARS-CoV-2 RNA is generally detectable in upper respiratory specimens during the acute phase of infection. The lowest concentration of SARS-CoV-2 viral copies this assay can detect is 138 copies/mL. A negative result does not preclude SARS-Cov-2 infection and should not be used as the sole basis for treatment or other patient management decisions. A negative result may  occur with  improper specimen  collection/handling, submission of specimen other than nasopharyngeal swab, presence of viral mutation(s) within the areas targeted by this assay, and inadequate number of viral copies(<138 copies/mL). A negative result must be combined with clinical observations, patient history, and epidemiological information. The expected result is Negative.  Fact Sheet for Patients:  EntrepreneurPulse.com.au  Fact Sheet for Healthcare Providers:  IncredibleEmployment.be  This test is no t yet approved or cleared by the [place] FDA and  has been authorized for detection and/or diagnosis of SARS-CoV-2 by FDA under an Emergency Use Authorization (EUA). This EUA will remain  in effect (meaning this test can be used) for the duration of the COVID-19 declaration under Section 564(b)(1) of the Act, 21 U.S.C.section 360bbb-3(b)(1), unless the authorization is terminated  or revoked sooner.       Influenza A by PCR NEGATIVE NEGATIVE Final   Influenza B by PCR NEGATIVE NEGATIVE Final    Comment: (NOTE) The Xpert Xpress SARS-CoV-2/FLU/RSV plus assay is intended as an aid in the diagnosis of influenza from Nasopharyngeal swab specimens and should not be used as a sole basis for treatment. Nasal washings and aspirates are unacceptable for Xpert Xpress SARS-CoV-2/FLU/RSV testing.  Fact Sheet for Patients: EntrepreneurPulse.com.au  Fact Sheet for Healthcare Providers: IncredibleEmployment.be  This test is not yet approved or cleared by the [place] FDA and has been authorized for detection and/or diagnosis of SARS-CoV-2 by FDA under an Emergency Use Authorization (EUA). This EUA will remain in effect (meaning this test can be used) for the duration of the COVID-19 declaration under Section 564(b)(1) of the Act, 21 U.S.C. section 360bbb-3(b)(1), unless the authorization is terminated or revoked.  Performed at [facility], [address] [place], [place] [address]   Blood culture (routine single)     Status: None (Preliminary result)   Collection Time: 07/25/20  6:18 AM   Specimen: BLOOD  Result Value Ref Range Status   Specimen Description BLOOD LEFT ANTECUBITAL  Final   Special Requests   Final    BOTTLES DRAWN AEROBIC AND ANAEROBIC Blood Culture results may not be optimal due to an excessive volume of blood received in culture bottles   Culture   Final    NO GROWTH 3 DAYS Performed at [facility], [address] [place], [place] [address]    Report Status PENDING  Incomplete  Culture, blood (Routine X 2) w Reflex to ID Panel     Status: None (Preliminary result)   Collection Time: 07/25/20  7:41 AM   Specimen: BLOOD  Result Value Ref Range Status   Specimen Description BLOOD BLOOD LEFT HAND  Final   Special Requests   Final    BOTTLES DRAWN AEROBIC AND ANAEROBIC Blood Culture adequate volume   Culture   Final    NO GROWTH 3 DAYS Performed at [facility], [address] [place], [place] [address]    Report Status PENDING  Incomplete     Labs: BNP (last 3 results) Recent Labs    07/25/20 0618  BNP 976.7*   Basic Metabolic Panel: Recent Labs  Lab 07/25/20 0618 07/26/20 0428 07/27/20 0429  NA 132* 138 136  K 3.8 4.7 4.2  CL 95* 103 99  CO2 23 27 27   GLUCOSE 325* 183* 254*  BUN 15 15 26*  CREATININE 1.04* 0.80 0.96  CALCIUM 9.0 9.1 9.2   Liver Function Tests: Recent Labs  Lab 07/25/20 0618  AST 26  ALT 19  ALKPHOS 85  BILITOT 0.8  PROT 7.4  ALBUMIN 3.7   No results for input(s): LIPASE, AMYLASE in the last 168 hours. No results for input(s): AMMONIA in the last 168 hours. CBC: Recent Labs  Lab 07/25/20 0618 07/26/20 0428 07/27/20 0429  WBC 19.8* 17.7* 19.0*  NEUTROABS 15.4*  --   --   HGB 13.6 11.6* 12.7  HCT 40.4 35.4* 38.2  MCV 91.0 91.5 90.3  PLT 313 313 365   Cardiac Enzymes: No results for input(s): CKTOTAL, CKMB,  CKMBINDEX, TROPONINI in the last 168 hours. BNP: Invalid input(s): POCBNP CBG: Recent Labs  Lab 07/27/20 0802 07/27/20 1121 07/27/20 1655 07/27/20 2129 07/28/20 0730  GLUCAP 247* 326* 261* 189* 261*   D-Dimer No results for input(s): DDIMER in the last 72 hours. Hgb A1c No results for input(s): HGBA1C in the last 72 hours. Lipid Profile No results for input(s): CHOL, HDL, LDLCALC, TRIG, CHOLHDL, LDLDIRECT in the last 72 hours. Thyroid function studies No results for input(s): TSH, T4TOTAL, T3FREE, THYROIDAB in the last 72 hours.  Invalid input(s): FREET3 Anemia work up No results for input(s): VITAMINB12, FOLATE, FERRITIN, TIBC, IRON, RETICCTPCT in the last 72 hours. Urinalysis    Component Value Date/Time   COLORURINE YELLOW (A) 06/07/2017 1114   APPEARANCEUR Clear 06/17/2019 1347   LABSPEC 1.014 06/07/2017 1114   PHURINE 7.0 06/07/2017 1114   GLUCOSEU Negative 06/17/2019 1347   HGBUR NEGATIVE 06/07/2017 1114   BILIRUBINUR Negative 06/17/2019 1347   KETONESUR NEGATIVE 06/07/2017 1114   PROTEINUR Negative 06/17/2019 1347   PROTEINUR NEGATIVE 06/07/2017 1114   NITRITE Negative 06/17/2019 1347   NITRITE NEGATIVE 06/07/2017 1114   LEUKOCYTESUR 1+ (A) 06/17/2019 1347   Sepsis Labs Invalid input(s): PROCALCITONIN,  WBC,  LACTICIDVEN Microbiology Recent Results (from the past 240 hour(s))  Resp Panel by RT-PCR (Flu A&B, Covid) Nasopharyngeal Swab     Status: None   Collection Time: 07/25/20  6:18 AM   Specimen: Nasopharyngeal Swab; Nasopharyngeal(NP) swabs in vial transport medium  Result Value Ref Range Status   SARS Coronavirus 2 by RT PCR NEGATIVE NEGATIVE Final    Comment: (NOTE) SARS-CoV-2 target nucleic acids are NOT DETECTED.  The SARS-CoV-2 RNA is generally detectable in upper respiratory specimens during the acute phase of infection. The lowest concentration of SARS-CoV-2 viral copies this assay can detect is 138 copies/mL. A negative result does not  preclude SARS-Cov-2 infection and should not be used as the sole basis for treatment or other patient management decisions. A negative result may occur with  improper specimen collection/handling, submission of specimen other than nasopharyngeal swab, presence of viral mutation(s) within the areas targeted by this assay, and inadequate number of viral copies(<138 copies/mL). A negative result must be combined with clinical observations, patient history, and epidemiological information. The expected result is Negative.  Fact Sheet for Patients:  EntrepreneurPulse.com.au  Fact Sheet for Healthcare Providers:  IncredibleEmployment.be  This test is no t yet approved or cleared by the [place] FDA and  has been authorized for detection and/or diagnosis of SARS-CoV-2 by FDA under an Emergency Use Authorization (EUA). This EUA will remain  in effect (meaning this test can be used) for the duration of the COVID-19 declaration under Section 564(b)(1) of the Act, 21 U.S.C.section 360bbb-3(b)(1), unless the authorization is terminated  or revoked sooner.       Influenza A by PCR NEGATIVE NEGATIVE Final   Influenza B by PCR NEGATIVE NEGATIVE Final    Comment: (NOTE) The Xpert Xpress  SARS-CoV-2/FLU/RSV plus assay is intended as an aid in the diagnosis of influenza from Nasopharyngeal swab specimens and should not be used as a sole basis for treatment. Nasal washings and aspirates are unacceptable for Xpert Xpress SARS-CoV-2/FLU/RSV testing.  Fact Sheet for Patients: EntrepreneurPulse.com.au  Fact Sheet for Healthcare Providers: IncredibleEmployment.be  This test is not yet approved or cleared by the [place] FDA and has been authorized for detection and/or diagnosis of SARS-CoV-2 by FDA under an Emergency Use Authorization (EUA). This EUA will remain in effect (meaning this test can be used) for the  duration of the COVID-19 declaration under Section 564(b)(1) of the Act, 21 U.S.C. section 360bbb-3(b)(1), unless the authorization is terminated or revoked.  Performed at [facility], [address] [place], [place] [address]   Blood culture (routine single)     Status: None (Preliminary result)   Collection Time: 07/25/20  6:18 AM   Specimen: BLOOD  Result Value Ref Range Status   Specimen Description BLOOD LEFT ANTECUBITAL  Final   Special Requests   Final    BOTTLES DRAWN AEROBIC AND ANAEROBIC Blood Culture results may not be optimal due to an excessive volume of blood received in culture bottles   Culture   Final    NO GROWTH 3 DAYS Performed at [facility], [address], [place], [place] [address]    Report Status PENDING  Incomplete  Culture, blood (Routine X 2) w Reflex to ID Panel     Status: None (Preliminary result)   Collection Time: 07/25/20  7:41 AM   Specimen: BLOOD  Result Value Ref Range Status   Specimen Description BLOOD BLOOD LEFT HAND  Final   Special Requests   Final    BOTTLES DRAWN AEROBIC AND ANAEROBIC Blood Culture adequate volume   Culture   Final    NO GROWTH 3 DAYS Performed at [facility], [address], [place], [place] [address]    Report Status PENDING  Incomplete     Time coordinating discharge: Over 30 minutes  SIGNED:   Phillips Climes, MD  [facility] 07/28/2020, 11:08 AM Pager   If 7PM-7AM, please contact night-coverage [email or web address] Password [email or web address]

## 2020-07-28 NOTE — Care Management Important Message (Signed)
Important Message  Patient Details  Name: Stacey Gross MRN: [id number] Date of Birth: [birth year]   Medicare Important Message Given:  Yes     Juliann Pulse A [name] 07/28/2020, 10:51 AM

## 2020-07-28 NOTE — TOC Transition Note (Signed)
Transition of Care [facility]) - CM/SW Discharge Note   Patient Details  Name: Stacey Gross MRN: [id number] Date of Birth: December 26, 1960  [facility] CM/SW Contact:  Shelbie Hutching, RN Phone Number: 07/28/2020, 12:50 PM   Clinical Narrative:    Patient admitted to the hospital for COPD exacerbation.  RNCM met with patient at the room.  Patient is happy to be discharged home today, sister Rip Harbour, is at the bedside and will be transporting the patient home.  Patient is going home on new oxygen, oxygen delivered to the patient's room yesterday by [employer].  Patient is independent at home and drives. No other TOC needs identified.    Final next level of care: Home/Self Care Barriers to Discharge: Barriers Resolved   Patient Goals and CMS Choice Patient states their goals for this hospitalization and ongoing recovery are:: very glad to be going home      Discharge Placement                       Discharge Plan and Services   Discharge Planning Services: CM Consult            DME Arranged: Oxygen DME Agency: [employer] Date DME Agency Contacted: 07/27/20 Time DME Agency Contacted: 1430 Representative spoke with at DME Agency: [place]: NA [place] Agency: NA        Social Determinants of Health ([place]) Interventions     Readmission Risk Interventions No flowsheet data found.

## 2020-07-28 NOTE — Discharge Instructions (Signed)
Follow with Primary MD Venita Lick, NP in 7 days   Get CBC, CMP, 2 view Chest X ray checked  by Primary MD next visit.    Activity: As tolerated with Full fall precautions use walker/cane & assistance as needed   Disposition Home    Diet: Heart Healthy /carb modifed, with feeding assistance and aspiration precautions.  For Heart failure patients - Check your Weight same time everyday, if you gain over 2 pounds, or you develop in leg swelling, experience more shortness of breath or chest pain, call your Primary MD immediately. Follow Cardiac Low Salt Diet and 1.5 lit/day fluid restriction.   On your next visit with your primary care physician please Get Medicines reviewed and adjusted.   Please request your Prim.MD to go over all Hospital Tests and Procedure/Radiological results at the follow up, please get all Hospital records sent to your Prim MD by signing hospital release before you go home.   If you experience worsening of your admission symptoms, develop shortness of breath, life threatening emergency, suicidal or homicidal thoughts you must seek medical attention immediately by calling 911 or calling your MD immediately  if symptoms less severe.  You Must read complete instructions/literature along with all the possible adverse reactions/side effects for all the Medicines you take and that have been prescribed to you. Take any new Medicines after you have completely understood and accpet all the possible adverse reactions/side effects.   Do not drive, operating heavy machinery, perform activities at heights, swimming or participation in water activities or provide baby sitting services if your were admitted for syncope or siezures until you have seen by Primary MD or a Neurologist and advised to do so again.  Do not drive when taking Pain medications.    Do not take more than prescribed Pain, Sleep and Anxiety Medications  Special Instructions: If you have smoked or  chewed Tobacco  in the last 2 yrs please stop smoking, stop any regular Alcohol  and or any Recreational drug use.  Wear Seat belts while driving.   Please note  You were cared for by a hospitalist during your hospital stay. If you have any questions about your discharge medications or the care you received while you were in the hospital after you are discharged, you can call the unit and asked to speak with the hospitalist on call if the hospitalist that took care of you is not available. Once you are discharged, your primary care physician will handle any further medical issues. Please note that NO REFILLS for any discharge medications will be authorized once you are discharged, as it is imperative that you return to your primary care physician (or establish a relationship with a primary care physician if you do not have one) for your aftercare needs so that they can reassess your need for medications and monitor your lab values.

## 2020-07-29 DIAGNOSIS — A419 Sepsis, unspecified organism: Secondary | ICD-10-CM | POA: Diagnosis not present

## 2020-07-29 DIAGNOSIS — I739 Peripheral vascular disease, unspecified: Secondary | ICD-10-CM | POA: Diagnosis not present

## 2020-07-29 DIAGNOSIS — J9601 Acute respiratory failure with hypoxia: Secondary | ICD-10-CM | POA: Diagnosis not present

## 2020-07-30 ENCOUNTER — Other Ambulatory Visit: Payer: Self-pay | Admitting: Nurse Practitioner

## 2020-07-30 NOTE — Telephone Encounter (Signed)
Routing to provider  

## 2020-07-30 NOTE — Telephone Encounter (Signed)
   Notes to clinic:  Patient has appointment on 08/14/20 Review for refill until appt    Requested Prescriptions  Pending Prescriptions Disp Refills   albuterol (PROVENTIL) (2.5 MG/3ML) 0.083% nebulizer solution [Pharmacy Med Name: ALBUTEROL SULFATE (2.5 MG/3ML) 0.08] 180 mL     Sig: USE 1 VIAL (3 ML = 2.5 MG TOTAL) BY NEBULIZATION EVERY 6 HOURS AS NEEDED FORWHEEZING OR SHORTNESS OF BREATH      Pulmonology:  Beta Agonists Failed - 07/30/2020 12:44 PM      Failed - One inhaler should last at least one month. If the patient is requesting refills earlier, contact the patient to check for uncontrolled symptoms.      Passed - Valid encounter within last 12 months    Recent Outpatient Visits           1 week ago COPD exacerbation ([place])   [place], [place] T, NP   2 months ago Type 2 diabetes, controlled, with peripheral circulatory disorder ([place])   [place], Jolene T, NP   6 months ago Hypertension associated with diabetes ([place])   [place] Cannady, Jolene T, NP   10 months ago Type 2 diabetes, controlled, with peripheral circulatory disorder [facility])   [place] Venita Lick, NP   11 months ago [place], Lilia Argue, [place]       Future Appointments             In 2 weeks Cannady, Barbaraann Faster, NP [facility] [facility], [facility]   In 9 months  [employer], [place]

## 2020-07-30 NOTE — Telephone Encounter (Signed)
Scheduled 5/13.

## 2020-07-31 ENCOUNTER — Telehealth: Payer: Self-pay

## 2020-07-31 ENCOUNTER — Ambulatory Visit: Payer: Medicare Other | Admitting: Nurse Practitioner

## 2020-07-31 ENCOUNTER — Ambulatory Visit: Payer: Self-pay

## 2020-07-31 NOTE — Telephone Encounter (Signed)
Pt verbalized understanding and stated she would be on her way to the emergency room.

## 2020-07-31 NOTE — Telephone Encounter (Signed)
Pt. Reports she came home Wednesday from the hospital - has pneumonia. Has developed swelling to both feet and ankles. Feels "like I'm not peeing enough." No chest pain or shortness of breath. No availability in the practice. Recommended ED. Pt. Does not want to go. Tomasita Crumble in the practice requests to send triage for review. Please advise pt.  Answer Assessment - Initial Assessment Questions 1. ONSET: "When did the swelling start?" (e.g., minutes, hours, days)     This week 2. LOCATION: "What part of the leg is swollen?"  "Are both legs swollen or just one leg?"     Both 3. SEVERITY: "How bad is the swelling?" (e.g., localized; mild, moderate, severe)  - Localized - small area of swelling localized to one leg  - MILD pedal edema - swelling limited to foot and ankle, pitting edema < 1/4 inch (6 mm) deep, rest and elevation eliminate most or all swelling  - MODERATE edema - swelling of lower leg to knee, pitting edema > 1/4 inch (6 mm) deep, rest and elevation only partially reduce swelling  - SEVERE edema - swelling extends above knee, facial or hand swelling present      Moderate 4. REDNESS: "Does the swelling look red or infected?"     No 5. PAIN: "Is the swelling painful to touch?" If Yes, ask: "How painful is it?"   (Scale 1-10; mild, moderate or severe)     4 6. FEVER: "Do you have a fever?" If Yes, ask: "What is it, how was it measured, and when did it start?"      No 7. CAUSE: "What do you think is causing the leg swelling?"     Unsure 8. MEDICAL HISTORY: "Do you have a history of heart failure, kidney disease, liver failure, or cancer?"     Pnuemonia 9. RECURRENT SYMPTOM: "Have you had leg swelling before?" If Yes, ask: "When was the last time?" "What happened that time?"     No 10. OTHER SYMPTOMS: "Do you have any other symptoms?" (e.g., chest pain, difficulty breathing)       No 11. PREGNANCY: "Is there any chance you are pregnant?" "When was your last menstrual period?"        No  Protocols used: LEG SWELLING AND EDEMA-A-AH

## 2020-07-31 NOTE — Telephone Encounter (Signed)
Advise ER if retaining fluid and recent pneumonia.  Concern for heart issues if fluid retention present.

## 2020-07-31 NOTE — Telephone Encounter (Signed)
Transition Care Management Follow-up Telephone Call  Date of discharge and from where: 07/28/2020 [facility]  How have you been since you were released from the hospital? Sore throat  Any questions or concerns? No  Items Reviewed:  Did the pt receive and understand the discharge instructions provided? Yes   Medications obtained and verified? Yes   Other? No   Any new allergies since your discharge? No   Dietary orders reviewed? Yes  Do you have support at home? Yes   Home Care and Equipment/Supplies: Were home health services ordered? not applicable If so, what is the name of the agency? n/a  Has the agency set up a time to come to the patient's home? not applicable Were any new equipment or medical supplies ordered?  Yes: oxygen What is the name of the medical supply agency?  Were you able to get the supplies/equipment? yes Do you have any questions related to the use of the equipment or supplies? No  Functional Questionnaire: (I = Independent and D = Dependent) ADLs: I  Bathing/Dressing- I  Meal Prep- D  Eating- I  Maintaining continence- I  Transferring/Ambulation- I  Managing Meds- I  Follow up appointments reviewed:   PCP Hospital f/u appt confirmed? Yes  Scheduled to see Stacey Gross on 08/14/2020 @ 2:20.  Are transportation arrangements needed? No   If their condition worsens, is the pt aware to call PCP or go to the Emergency Dept.? Yes  Was the patient provided with contact information for the PCP's office or ED? Yes  Was to pt encouraged to call back with questions or concerns? Yes

## 2020-08-01 LAB — CULTURE, BLOOD (SINGLE): Culture: NO GROWTH

## 2020-08-01 LAB — CULTURE, BLOOD (ROUTINE X 2)
Culture: NO GROWTH
Special Requests: ADEQUATE

## 2020-08-03 ENCOUNTER — Telehealth: Payer: Self-pay

## 2020-08-03 ENCOUNTER — Other Ambulatory Visit: Payer: Self-pay | Admitting: Nurse Practitioner

## 2020-08-03 MED ORDER — NYSTATIN 100000 UNIT/ML MT SUSP: 5.0000 mL | Freq: Four times a day (QID) | OROMUCOSAL | 0 refills | Status: DC

## 2020-08-03 NOTE — Telephone Encounter (Signed)
Please advise hosp f/u scheduled 5/13  Copied from [place] #[id number]. Topic: General - Inquiry >> Aug 03, 2020  9:53 AM Loma [place] wrote: Pt states has Ritta Slot Ripley Fraise from hospital 3 days/ last appt 4/19 thinks all meds and everything is what has called/ wants to know if Jolene can call in med.  [place], [place] - [address] [place] [address] Phone: [phone] Fax: [phone] Hours: Not open 24 hours

## 2020-08-03 NOTE — Telephone Encounter (Signed)
Sent in Nystatin for her.  

## 2020-08-03 NOTE — Telephone Encounter (Signed)
Called pt advised of Jolene's message. Pt verbalized understanding  

## 2020-08-06 ENCOUNTER — Telehealth: Payer: Self-pay | Admitting: Nurse Practitioner

## 2020-08-06 NOTE — Telephone Encounter (Signed)
Called Stacey Gross advised her of Stacey Gross's message she states that she already has oxygen at home was sent home with her from the hospital. It's one of the big tanks.

## 2020-08-06 NOTE — Telephone Encounter (Signed)
Yes, for coverage will need to visit as most likely will want documentation for the portable and reasoning documented for coverage.  We can work on this at her visit and work on obtaining.

## 2020-08-06 NOTE — Telephone Encounter (Signed)
Patient called to request an order for a portable oxygent tank.  Patient stated that the one she has is too heavy for her to carry around and the company said they would need an order from the doctor to approve the portable one.  Please advise and call to discuss at [phone]

## 2020-08-06 NOTE — Telephone Encounter (Signed)
We can discuss at upcoming appointment, insurance will most likely need visit and documentation for this.  We may also need to refer to pulmonary.

## 2020-08-10 ENCOUNTER — Other Ambulatory Visit: Payer: Self-pay | Admitting: Nurse Practitioner

## 2020-08-10 NOTE — Telephone Encounter (Signed)
Requested medication (s) are due for refill today: yes  Requested medication (s) are on the active medication list: yes  Last refill: 08/03/20  Future visit scheduled:yes  Notes to clinic:  no assigned protocol    Requested Prescriptions  Pending Prescriptions Disp Refills   nystatin (MYCOSTATIN) 100000 UNIT/ML suspension [Pharmacy Med Name: NYSTATIN 100000 UNIT/ML MOUTH SUSP] 60 mL 0    Sig: TAKE 5 ML BY MOUTH 4 TIMES DAILY      Off-Protocol Failed - 08/10/2020 12:28 PM      Failed - Medication not assigned to a protocol, review manually.      Passed - Valid encounter within last 12 months    Recent Outpatient Visits           2 weeks ago COPD exacerbation ([place])   [place], [place] T, NP   2 months ago Type 2 diabetes, controlled, with peripheral circulatory disorder ([place])   [place], Jolene T, NP   7 months ago Hypertension associated with diabetes ([place])   [place] Cannady, Jolene T, NP   10 months ago Type 2 diabetes, controlled, with peripheral circulatory disorder [facility])   [place] Venita Lick, NP   11 months ago [place], Lilia Argue, PA-C       Future Appointments             In 4 days Cannady, Barbaraann Faster, NP [employer], [facility]   In 10 months  [employer], [place]

## 2020-08-10 NOTE — Telephone Encounter (Signed)
Scheduled 5/13.

## 2020-08-10 NOTE — Telephone Encounter (Signed)
Routing to provider  

## 2020-08-14 ENCOUNTER — Ambulatory Visit (INDEPENDENT_AMBULATORY_CARE_PROVIDER_SITE_OTHER): Payer: Medicare Other | Admitting: Nurse Practitioner

## 2020-08-14 ENCOUNTER — Encounter: Payer: Self-pay | Admitting: Pharmacist

## 2020-08-14 ENCOUNTER — Encounter: Payer: Self-pay | Admitting: Nurse Practitioner

## 2020-08-14 ENCOUNTER — Other Ambulatory Visit: Payer: Self-pay

## 2020-08-14 ENCOUNTER — Telehealth: Payer: Self-pay | Admitting: Pulmonary Disease

## 2020-08-14 VITALS — BP 121/77 | HR 89 | Temp 99.0°F | Wt 223.2 lb

## 2020-08-14 DIAGNOSIS — R34 Anuria and oliguria: Secondary | ICD-10-CM | POA: Diagnosis not present

## 2020-08-14 DIAGNOSIS — A419 Sepsis, unspecified organism: Secondary | ICD-10-CM | POA: Diagnosis not present

## 2020-08-14 DIAGNOSIS — E6609 Other obesity due to excess calories: Secondary | ICD-10-CM

## 2020-08-14 DIAGNOSIS — J441 Chronic obstructive pulmonary disease with (acute) exacerbation: Secondary | ICD-10-CM | POA: Diagnosis not present

## 2020-08-14 DIAGNOSIS — E1169 Type 2 diabetes mellitus with other specified complication: Secondary | ICD-10-CM | POA: Diagnosis not present

## 2020-08-14 DIAGNOSIS — J9611 Chronic respiratory failure with hypoxia: Secondary | ICD-10-CM | POA: Diagnosis not present

## 2020-08-14 DIAGNOSIS — F17219 Nicotine dependence, cigarettes, with unspecified nicotine-induced disorders: Secondary | ICD-10-CM | POA: Diagnosis not present

## 2020-08-14 DIAGNOSIS — J9612 Chronic respiratory failure with hypercapnia: Secondary | ICD-10-CM

## 2020-08-14 DIAGNOSIS — E1159 Type 2 diabetes mellitus with other circulatory complications: Secondary | ICD-10-CM | POA: Diagnosis not present

## 2020-08-14 DIAGNOSIS — E1151 Type 2 diabetes mellitus with diabetic peripheral angiopathy without gangrene: Secondary | ICD-10-CM | POA: Diagnosis not present

## 2020-08-14 DIAGNOSIS — R197 Diarrhea, unspecified: Secondary | ICD-10-CM

## 2020-08-14 DIAGNOSIS — I152 Hypertension secondary to endocrine disorders: Secondary | ICD-10-CM

## 2020-08-14 DIAGNOSIS — R600 Localized edema: Secondary | ICD-10-CM

## 2020-08-14 DIAGNOSIS — Z6834 Body mass index (BMI) 34.0-34.9, adult: Secondary | ICD-10-CM

## 2020-08-14 DIAGNOSIS — I700 Atherosclerosis of aorta: Secondary | ICD-10-CM | POA: Diagnosis not present

## 2020-08-14 DIAGNOSIS — E785 Hyperlipidemia, unspecified: Secondary | ICD-10-CM

## 2020-08-14 MED ORDER — LOSARTAN POTASSIUM 50 MG PO TABS: 50.0000 mg | ORAL_TABLET | Freq: Every day | ORAL | 4 refills | Status: DC

## 2020-08-14 NOTE — Telephone Encounter (Signed)
No availability in [place] office.  Spoke to patient, who stated that she is willing to travel to [place]. First available with MW is 09/08/2020. Patient is okay with this appt date/time. Provided address and contact number.  Advised patient to go to ED or UC if sx current in the meantime.   Routing to MW for review since referral was placed as urgent. Do you think patient needs to be seen sooner then 6/7?

## 2020-08-14 NOTE — Assessment & Plan Note (Signed)
BMI 34.96.  Recommended eating smaller high protein, low fat meals more frequently and exercising 30 mins a day 5 times a week with a goal of 10-15lb weight loss in the next 3 months. Patient voiced their understanding and motivation to adhere to these recommendations.

## 2020-08-14 NOTE — Assessment & Plan Note (Signed)
Chronic, stable with BP below goal.  They added Amlodipine in hospital, now noted to have edema bilateral lower extremities.  At this time will discontinue Amlodipine and increase Losartan to 50 MG (last K+ 4.2), new script sent and educated patient on this.  Recommend she monitor BP at home regularly and document for provider + focus on DASH diet.  CMP, CBC, TSH today.  Return in 2 weeks.

## 2020-08-14 NOTE — Assessment & Plan Note (Signed)
Suspect related to fluid loss with diarrhea, will check CMP, BNP, CBC, and send home to obtain urine sample.  Return in 2 weeks, sooner if worsening.

## 2020-08-14 NOTE — Progress Notes (Signed)
[facility])                                            [place] Team                                        Statin Quality Measure Assessment    08/14/2020  YSABELA KEISLER Jul 22, 1960 [id number]     Per review of chart and payor information, patient has a diagnosis of diabetes and cardiovascular disease but is not currently filling a statin prescription.  This places patient into the SUPD (Statin Use In Patients with Diabetes) and SPC (Statin Use in Patients with Cardiovascular Disease) measures for CMS.    Patient currently has a prescription for rosuvastatin 20mg , last filled for 90 days supply on 04/01/20.      Component Value Date/Time   CHOL 181 05/13/2020 0934   CHOL 194 02/22/2017 0847   TRIG 211 (H) 05/13/2020 0934   TRIG 227 (H) 02/22/2017 0847   HDL 44 05/13/2020 0934   CHOLHDL 4.1 05/13/2020 0934   VLDL 45 (H) 02/22/2017 0847   LDLCALC 101 (H) 05/13/2020 0934    Please consider addressing any adverse reactions or restarting rosuvastatin 20mg  at upcoming office appointment.    Thank you!   Ruben Reason, [place] [phone]

## 2020-08-14 NOTE — Assessment & Plan Note (Signed)
Acute and improving, but now on continuous O2.  Continue current inhaler regimen and adjust as needed.  CBC and CMP today.  Referral to pulmonary urgent due to recent exacerbation and worsening lung disease.  Return in 2 weeks.

## 2020-08-14 NOTE — Assessment & Plan Note (Signed)
Chronic, ongoing with A1C upward in hospital at 7.1% -- recent Prednisone.  Urine ALB 30 and A:C <30 in January 2022.  Continue Losartan for kidney protection and check CMP.  Continue current medication regimen (Metformin and Ozempic) + checking BS at home daily.  Monitor diet at home and reduce carbs and foods high in sugar.  Continue to collaborate with vascular.  Return in 3 months.

## 2020-08-14 NOTE — Assessment & Plan Note (Signed)
I have recommended complete cessation of tobacco use. I have discussed various options available for assistance with tobacco cessation including over the counter methods (Nicotine gum, patch and lozenges). We also discussed prescription options (Chantix, Nicotine Inhaler / Nasal Spray). The patient is not interested in pursuing any prescription tobacco cessation options at this time.  Continue yearly lung screening.  

## 2020-08-14 NOTE — Assessment & Plan Note (Signed)
Chronic, ongoing noted on lung CT screening.  Continue statin and ASA daily for prevention.  Continue collaboration with vascular.  Recommend complete cessation smoking. 

## 2020-08-14 NOTE — Progress Notes (Signed)
BP 121/77   Pulse 89   Temp 99 F (37.2 C) (Oral)   Wt 223 lb 3.2 oz (101.2 kg)   SpO2 96%   BMI 34.96 kg/m    Subjective:    Patient ID: Stacey Gross, female    DOB: 1960/04/09, 60 y.o.   MRN: [id number]  HPI: Stacey Gross is a 60 y.o. female  Chief Complaint  Patient presents with  . Hospitalization Follow-up  . Leg Swelling    Patient state she has noticed some leg swelling since last week. Patient states during the day the swelling starts are ok and throughout the day it progressively gets worse.   Marland Kitchen Urinary Retention    Patient states since hospitalization she has not been able to use the restroom and states it has been about 3 weeks. Patient states she when she does use the restroom it is small few drops and states she is drinking plenty of fluids.   . Diarrhea   Transition of Care Hospital Follow up.  Was admitted on 07/25/20 due to COPD exacerbation. Covid negative. She was discharged on oxygen -- using 2 L, unless walking then goes to 4 L.  Was treated with IV steroid and abx == discharged with a prolonged taper. Was started on Amlodipine 10 MG.    Has had diarrhea since leaving the hospital, every time she eats she has diarrhea -- has odor, watery, and "looks oily".   She is also getting nauseous.  No fever, no change in abdominal pain.  No blood in stool.    Is not urinating much, only dribbling x 3 weeks.  Drinking lots of water.  No dysuria, hematuria, frequency, urgency.  Smaller amounts of urine.  Has urinated twice today.    Would like portable O2 -- she spoke with [facility] and they reported they could bring her a portable O2.  She was not using portable O2 prior to this hospitalization.    Hospital/Facility: [facility] D/C Physician: Dr. Waldron Labs D/C Date: 07/28/2020  Records Requested: 08/14/20 Records Received: 08/14/20 Records Reviewed: 08/14/20  Diagnoses on Discharge: COPD exacerbation   Date of interactive Contact within 48 hours of discharge:  Contact  was through: phone  Date of 7 day or 14 day face-to-face visit:    within 14 days  Outpatient Encounter Medications as of 08/14/2020  Medication Sig  . albuterol (PROVENTIL) (2.5 MG/3ML) 0.083% nebulizer solution USE 1 VIAL (3 ML = 2.5 MG TOTAL) BY NEBULIZATION EVERY 6 HOURS AS NEEDED FORWHEEZING OR SHORTNESS OF BREATH  . albuterol (VENTOLIN HFA) 108 (90 Base) MCG/ACT inhaler INHALE 2 PUFFS BY MOUTH INTO THE LUNGS EVERY 6 HOURS AS NEEDED FOR WHEEZING OR SHORTNESS OF BREATH  . benzonatate (TESSALON) 200 MG capsule Take 1 capsule (200 mg total) by mouth 2 (two) times daily as needed for cough.  . Blood Glucose Monitoring Suppl (ONE TOUCH ULTRA SYSTEM KIT) W/DEVICE KIT 1 kit by Does not apply route once. (Patient taking differently: 1 kit by Does not apply route daily.)  . Budeson-Glycopyrrol-Formoterol (BREZTRI AEROSPHERE) 160-9-4.8 MCG/ACT AERO Inhale 2 puffs into the lungs 2 (two) times daily.  . cilostazol (PLETAL) 100 MG tablet Take 1 tablet (100 mg total) by mouth 2 (two) times daily before a meal.  . citalopram (CELEXA) 20 MG tablet Take 1 tablet (20 mg total) by mouth daily.  . clobetasol (TEMOVATE) 0.05 % external solution Patient to mix solution in 1 jar of CeraVe Cream. Apply to itchy rash twice daily until  improved.  . cyclobenzaprine (FLEXERIL) 10 MG tablet TAKE ONE TABLET BY MOUTH AT BEDTIME (Patient taking differently: Take 10 mg by mouth at bedtime as needed for muscle spasms.)  . glucose blood test strip Use to check blood sugar three times a day.  . hydrOXYzine (ATARAX/VISTARIL) 25 MG tablet Take 1 tablet (25 mg total) by mouth as directed. Take 1-2 tablets by mouth every 4-6 hours as needed for itch. MAY MAKE DROWSY  . losartan (COZAAR) 50 MG tablet Take 1 tablet (50 mg total) by mouth daily.  . meloxicam (MOBIC) 15 MG tablet Take 1 tablet (15 mg total) by mouth daily.  . metFORMIN (GLUCOPHAGE) 500 MG tablet Please take 1000 mg oral twice daily for next 10 days, then decrease to  5:30 100 mg oral twice daily thereafter.  . nicotine (NICODERM CQ - DOSED IN MG/24 HOURS) 21 mg/24hr patch Place 1 patch (21 mg total) onto the skin daily.  Marland Kitchen nystatin (MYCOSTATIN) 100000 UNIT/ML suspension TAKE 5 ML BY MOUTH 4 TIMES DAILY  . OZEMPIC, 0.25 OR 0.5 MG/DOSE, 2 MG/1.5ML SOPN INJECT 0.5 MG INTO THE SKIN ONCE A WEEK (Patient taking differently: Inject 0.5 mg into the skin once a week. Takes every Tuesday)  . pantoprazole (PROTONIX) 40 MG tablet Take 1 tablet (40 mg total) by mouth daily.  . predniSONE (DELTASONE) 10 MG tablet Please take 50 mg oral daily x3 days, then decrease to 30 mg oral daily x3 days, then 20 mg oral daily x 3 days, then 10 mg oral x2 days then stop.  . rosuvastatin (CRESTOR) 20 MG tablet Take 1 tablet (20 mg total) by mouth daily.  . traZODone (DESYREL) 100 MG tablet TAKE 1 TABLET BY MOUTH AT BEDTIME AS NEEDED FOR SLEEP  . vitamin B-12 (CYANOCOBALAMIN) 1000 MCG tablet Take 1 tablet (1,000 mcg total) by mouth daily.  . [DISCONTINUED] amLODipine (NORVASC) 10 MG tablet Take 1 tablet (10 mg total) by mouth daily.  . [DISCONTINUED] losartan (COZAAR) 25 MG tablet Take 1 tablet (25 mg total) by mouth daily. (Patient taking differently: Take 25 mg by mouth every evening.)  . [DISCONTINUED] chlorpheniramine-HYDROcodone (TUSSIONEX PENNKINETIC ER) 10-8 MG/5ML SUER Take 5 mLs by mouth every 12 (twelve) hours as needed for cough. (Patient not taking: No sig reported)  . [DISCONTINUED] docusate sodium (COLACE) 100 MG capsule Take 100 mg by mouth daily as needed for mild constipation. (Patient not taking: No sig reported)  . [DISCONTINUED] tiZANidine (ZANAFLEX) 4 MG tablet Take 4 mg by mouth at bedtime. (Patient not taking: No sig reported)   No facility-administered encounter medications on file as of 08/14/2020.    Diagnostic Tests Reviewed/Disposition: reviewed on recent inpatient visit  Consults: none  Discharge Instructions: PCP to follow-up  Disease/illness Education:  Discussed with patient today  Home Health/Community Services Discussions/Referrals: none  Establishment or re-establishment of referral orders for community resources: None  Discussion with other health care providers: Reviewed with patient  Assessment and Support of treatment regimen adherence: Discussed with patient today  Appointments Coordinated with: Discussed with patient today  Education for self-management, independent living, and ADLs: Discussed with patient today  COPD Continues to smoke 1/2 PPD (was smoking 3 PPD), not interested in quitting -- after this 1/2 a pack she plans on quitting . Went for lung CT screening February 25th, 2021. Current medications include Breztri and Albuterol.    CBC at discharge noted WBC 19, creatinine 0.80 and eGFR >60. COPD status:stable Satisfied with current treatment?:yes Oxygen use:yes Dyspnea frequency: present  Cough frequency: at baseline Rescue inhaler frequency: every day Limitation of activity:yes Productive cough: none Last Spirometry:unknown Pneumovax:Up to Date Influenza:Up to Date  DIABETES A1C last inApril 7.1% on 07/25/20. Continues on Metformin 500 MG BID (increased Metformin to 1000 MG BID for brief period at discharge due to Prednisone) and Ozempic 0.5MG weekly.  Refuses Covid vaccination. Hypoglycemic episodes:no Polydipsia/polyuria:no Visual disturbance:no Chest pain:no Paresthesias:no Glucose Monitoring:yes Accucheck frequency: twice a week Fasting glucose: 167 this morning without Prednisone on board Post prandial: Evening: Before meals: Taking Insulin?:no Long acting insulin: Short acting insulin: Blood Pressure Monitoring:not checking Retinal Examination:Not up to Date Foot Exam:Up to Date Pneumovax:Up to Date Influenza:Not Up to Date -- getting today Aspirin:no  HYPERTENSION /  HYPERLIPIDEMIA Continues onLosartan. She is taking Crestor 20 MG daily.   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 Satisfied with current treatment?yes Duration of hypertension:chronic BP monitoring frequency:not checking BP range:  BP medication side effects:no Duration of hyperlipidemia:chronic Cholesterol medication side effects:no Cholesterol supplements: none Medication compliance:good compliance Aspirin:no Recent stressors:no Recurrent headaches:no Visual changes:no Palpitations:no Dyspnea:no Chest pain:yes in past -- saw cardiology last 01/09/20 -- she refused stress testing Lower extremity edema: yes Dizzy/lightheaded:no  Relevant past medical, surgical, family and social history reviewed and updated as indicated. Interim medical history since our last visit reviewed. Allergies and medications reviewed and updated.  Review of Systems  Constitutional: Negative for activity change, appetite change, diaphoresis, fatigue and fever.  Respiratory: Positive for cough (baseline), shortness of breath and wheezing. Negative for chest tightness.   Cardiovascular: Positive for chest pain (occasional, better with TUMS) and leg swelling. Negative for palpitations.  Gastrointestinal: Positive for diarrhea and nausea. Negative for abdominal distention, abdominal pain, blood in stool, constipation and vomiting.  Endocrine: Negative for polydipsia, polyphagia and polyuria.  Genitourinary: Positive for decreased urine volume. Negative for dysuria, frequency, hematuria and urgency.  Neurological: Negative.   Psychiatric/Behavioral: Negative for decreased concentration, sleep disturbance and suicidal ideas. The patient is not nervous/anxious.     Per HPI unless specifically indicated above     Objective:    BP 121/77   Pulse 89   Temp 99 F (37.2 C) (Oral)   Wt 223 lb 3.2 oz (101.2 kg)   SpO2 96%   BMI 34.96 kg/m   Wt Readings  from Last 3 Encounters:  08/14/20 223 lb 3.2 oz (101.2 kg)  07/25/20 208 lb (94.3 kg)  06/05/20 211 lb (95.7 kg)    Physical Exam Vitals and nursing note reviewed.  Constitutional:      General: She is awake.     Appearance: She is well-developed and well-groomed. She is obese.  HENT:     Head: Normocephalic.     Right Ear: Hearing normal.     Left Ear: Hearing normal.  Eyes:     General: Lids are normal.        Right eye: No discharge.        Left eye: No discharge.     Conjunctiva/sclera: Conjunctivae normal.     Pupils: Pupils are equal, round, and reactive to light.  Neck:     Thyroid: No thyromegaly.     Vascular: No carotid bruit.  Cardiovascular:     Rate and Rhythm: Normal rate and regular rhythm.     Heart sounds: Normal heart sounds. No murmur heard. No gallop.   Pulmonary:     Effort: Pulmonary effort is normal. No accessory muscle usage or respiratory distress.  Breath sounds: Decreased breath sounds and wheezing present. No rhonchi.     Comments: Decreased lung sounds per baseline with expiratory wheezes throughout, no rhonchi noted.  No SOB with talking. Abdominal:     General: Bowel sounds are normal. There is no distension.     Palpations: Abdomen is soft. There is no hepatomegaly.     Tenderness: There is no abdominal tenderness. There is no right CVA tenderness or left CVA tenderness.  Musculoskeletal:     Cervical back: Normal range of motion and neck supple.     Right lower leg: No edema.     Left lower leg: No edema.  Skin:    General: Skin is warm and dry.     Comments: Scattered pale purple bruising bilateral upper extremities.  Neurological:     Mental Status: She is alert and oriented to person, place, and time.  Psychiatric:        Attention and Perception: Attention normal.        Mood and Affect: Mood normal.        Speech: Speech normal.        Behavior: Behavior normal. Behavior is cooperative.        Thought Content: Thought content  normal.    Results for orders placed or performed during the hospital encounter of 07/25/20  Resp Panel by RT-PCR (Flu A&B, Covid) Nasopharyngeal Swab   Specimen: Nasopharyngeal Swab; Nasopharyngeal(NP) swabs in vial transport medium  Result Value Ref Range   SARS Coronavirus 2 by RT PCR NEGATIVE NEGATIVE   Influenza A by PCR NEGATIVE NEGATIVE   Influenza B by PCR NEGATIVE NEGATIVE  Blood culture (routine single)   Specimen: BLOOD  Result Value Ref Range   Specimen Description BLOOD LEFT ANTECUBITAL    Special Requests      BOTTLES DRAWN AEROBIC AND ANAEROBIC Blood Culture results may not be optimal due to an excessive volume of blood received in culture bottles   Culture      NO GROWTH 7 DAYS Performed at [facility], [address] [place], [place] [address]    Report Status 08/01/2020 FINAL   Culture, blood (Routine X 2) w Reflex to ID Panel   Specimen: BLOOD  Result Value Ref Range   Specimen Description BLOOD BLOOD LEFT HAND    Special Requests      BOTTLES DRAWN AEROBIC AND ANAEROBIC Blood Culture adequate volume   Culture      NO GROWTH 7 DAYS Performed at [facility], [address] [place],  [id number]    Report Status 08/01/2020 FINAL   Comprehensive metabolic panel  Result Value Ref Range   Sodium 132 (L) 135 - 145 mmol/L   Potassium 3.8 3.5 - 5.1 mmol/L   Chloride 95 (L) 98 - 111 mmol/L   CO2 23 22 - 32 mmol/L   Glucose, Bld 325 (H) 70 - 99 mg/dL   BUN 15 8 - 23 mg/dL   Creatinine, Ser 1.04 (H) 0.44 - 1.00 mg/dL   Calcium 9.0 8.9 - 10.3 mg/dL   Total Protein 7.4 6.5 - 8.1 g/dL   Albumin 3.7 3.5 - 5.0 g/dL   AST 26 15 - 41 U/L   ALT 19 0 - 44 U/L   Alkaline Phosphatase 85 38 - 126 U/L   Total Bilirubin 0.8 0.3 - 1.2 mg/dL   GFR, Estimated >60 >60 mL/min   Anion gap 14 5 - 15  Brain natriuretic peptide  Result Value Ref Range  B Natriuretic Peptide 120.3 (H) 0.0 - 100.0 pg/mL  CBC with Differential/Platelet  Result Value  Ref Range   WBC 19.8 (H) 4.0 - 10.5 K/uL   RBC 4.44 3.87 - 5.11 MIL/uL   Hemoglobin 13.6 12.0 - 15.0 g/dL   HCT 40.4 36.0 - 46.0 %   MCV 91.0 80.0 - 100.0 fL   MCH 30.6 26.0 - 34.0 pg   MCHC 33.7 30.0 - 36.0 g/dL   RDW 12.7 11.5 - 15.5 %   Platelets 313 150 - 400 K/uL   nRBC 0.0 0.0 - 0.2 %   Neutrophils Relative % 78 %   Neutro Abs 15.4 (H) 1.7 - 7.7 K/uL   Lymphocytes Relative 13 %   Lymphs Abs 2.6 0.7 - 4.0 K/uL   Monocytes Relative 7 %   Monocytes Absolute 1.3 (H) 0.1 - 1.0 K/uL   Eosinophils Relative 0 %   Eosinophils Absolute 0.0 0.0 - 0.5 K/uL   Basophils Relative 1 %   Basophils Absolute 0.1 0.0 - 0.1 K/uL   Immature Granulocytes 1 %   Abs Immature Granulocytes 0.26 (H) 0.00 - 0.07 K/uL  Lactic acid, plasma  Result Value Ref Range   Lactic Acid, Venous 2.0 (HH) 0.5 - 1.9 mmol/L  Lactic acid, plasma  Result Value Ref Range   Lactic Acid, Venous 1.9 0.5 - 1.9 mmol/L  Protime-INR  Result Value Ref Range   Prothrombin Time 14.1 11.4 - 15.2 seconds   INR 1.1 0.8 - 1.2  Procalcitonin  Result Value Ref Range   Procalcitonin <0.10 ng/mL  HIV Antibody (routine testing w rflx)  Result Value Ref Range   HIV Screen 4th Generation wRfx Non Reactive Non Reactive  Glucose, capillary  Result Value Ref Range   Glucose-Capillary 418 (H) 70 - 99 mg/dL  Glucose, capillary  Result Value Ref Range   Glucose-Capillary 276 (H) 70 - 99 mg/dL  Basic metabolic panel  Result Value Ref Range   Sodium 138 135 - 145 mmol/L   Potassium 4.7 3.5 - 5.1 mmol/L   Chloride 103 98 - 111 mmol/L   CO2 27 22 - 32 mmol/L   Glucose, Bld 183 (H) 70 - 99 mg/dL   BUN 15 8 - 23 mg/dL   Creatinine, Ser 0.80 0.44 - 1.00 mg/dL   Calcium 9.1 8.9 - 10.3 mg/dL   GFR, Estimated >60 >60 mL/min   Anion gap 8 5 - 15  CBC  Result Value Ref Range   WBC 17.7 (H) 4.0 - 10.5 K/uL   RBC 3.87 3.87 - 5.11 MIL/uL   Hemoglobin 11.6 (L) 12.0 - 15.0 g/dL   HCT 35.4 (L) 36.0 - 46.0 %   MCV 91.5 80.0 - 100.0 fL    MCH 30.0 26.0 - 34.0 pg   MCHC 32.8 30.0 - 36.0 g/dL   RDW 12.5 11.5 - 15.5 %   Platelets 313 150 - 400 K/uL   nRBC 0.0 0.0 - 0.2 %  Glucose, capillary  Result Value Ref Range   Glucose-Capillary 404 (H) 70 - 99 mg/dL  Hemoglobin A1c  Result Value Ref Range   Hgb A1c MFr Bld 7.1 (H) 4.8 - 5.6 %   Mean Plasma Glucose 157.07 mg/dL  Glucose, capillary  Result Value Ref Range   Glucose-Capillary 297 (H) 70 - 99 mg/dL  Glucose, capillary  Result Value Ref Range   Glucose-Capillary 347 (H) 70 - 99 mg/dL  Glucose, capillary  Result Value Ref Range   Glucose-Capillary 304 (H) 70 -  99 mg/dL  CBC  Result Value Ref Range   WBC 19.0 (H) 4.0 - 10.5 K/uL   RBC 4.23 3.87 - 5.11 MIL/uL   Hemoglobin 12.7 12.0 - 15.0 g/dL   HCT 38.2 36.0 - 46.0 %   MCV 90.3 80.0 - 100.0 fL   MCH 30.0 26.0 - 34.0 pg   MCHC 33.2 30.0 - 36.0 g/dL   RDW 12.4 11.5 - 15.5 %   Platelets 365 150 - 400 K/uL   nRBC 0.0 0.0 - 0.2 %  Basic metabolic panel  Result Value Ref Range   Sodium 136 135 - 145 mmol/L   Potassium 4.2 3.5 - 5.1 mmol/L   Chloride 99 98 - 111 mmol/L   CO2 27 22 - 32 mmol/L   Glucose, Bld 254 (H) 70 - 99 mg/dL   BUN 26 (H) 8 - 23 mg/dL   Creatinine, Ser 0.96 0.44 - 1.00 mg/dL   Calcium 9.2 8.9 - 10.3 mg/dL   GFR, Estimated >60 >60 mL/min   Anion gap 10 5 - 15  Glucose, capillary  Result Value Ref Range   Glucose-Capillary 233 (H) 70 - 99 mg/dL  Glucose, capillary  Result Value Ref Range   Glucose-Capillary 247 (H) 70 - 99 mg/dL  Glucose, capillary  Result Value Ref Range   Glucose-Capillary 326 (H) 70 - 99 mg/dL  Glucose, capillary  Result Value Ref Range   Glucose-Capillary 261 (H) 70 - 99 mg/dL  Glucose, capillary  Result Value Ref Range   Glucose-Capillary 189 (H) 70 - 99 mg/dL  Glucose, capillary  Result Value Ref Range   Glucose-Capillary 261 (H) 70 - 99 mg/dL  CBG monitoring, ED  Result Value Ref Range   Glucose-Capillary 285 (H) 70 - 99 mg/dL  Troponin I (High  Sensitivity)  Result Value Ref Range   Troponin I (High Sensitivity) 7 <18 ng/L      Assessment & Plan:   Problem List Items Addressed This Visit      Cardiovascular and Mediastinum   Hypertension associated with diabetes ([place])    Chronic, stable with BP below goal.  They added Amlodipine in hospital, now noted to have edema bilateral lower extremities.  At this time will discontinue Amlodipine and increase Losartan to 50 MG (last K+ 4.2), new script sent and educated patient on this.  Recommend she monitor BP at home regularly and document for provider + focus on DASH diet.  CMP, CBC, TSH today.  Return in 2 weeks.      Relevant Medications   losartan (COZAAR) 50 MG tablet   Other Relevant Orders   ECHOCARDIOGRAM COMPLETE   Aortic atherosclerosis (HCC)    Chronic, ongoing noted on lung CT screening.  Continue statin and ASA daily for prevention.  Continue collaboration with vascular.  Recommend complete cessation smoking.      Relevant Medications   losartan (COZAAR) 50 MG tablet   Type 2 diabetes, controlled, with peripheral circulatory disorder (HCC)    Chronic, ongoing with A1C upward in hospital at 7.1% -- recent Prednisone.  Urine ALB 30 and A:C <30 in January 2022.  Continue Losartan for kidney protection and check CMP.  Continue current medication regimen (Metformin and Ozempic) + checking BS at home daily.  Monitor diet at home and reduce carbs and foods high in sugar.  Continue to collaborate with vascular.  Return in 3 months.      Relevant Medications   losartan (COZAAR) 50 MG tablet     Respiratory  COPD exacerbation ([place]) - Primary    Acute and improving, but now on continuous O2.  Continue current inhaler regimen and adjust as needed.  CBC and CMP today.  Referral to pulmonary urgent due to recent exacerbation and worsening lung disease.  Return in 2 weeks.      Relevant Orders   CBC with Differential/Platelet   Alpha-1-Antitrypsin Deficiency   Ambulatory  referral to Pulmonology   Chronic respiratory failure with hypoxia and hypercapnia (HCC)    Ongoing, worsening after after recent exacerbation -- at this time continue oxygen, will work on getting portable O2 tank that will be easier for her to utilize.  Referral to pulmonary urgent due to recent exacerbation and worsening lung disease.      Relevant Orders   Ambulatory referral to Pulmonology   ECHOCARDIOGRAM COMPLETE     Endocrine   Hyperlipidemia associated with type 2 diabetes mellitus (HCC)    Chronic, ongoing.  Continue Crestor and adjust dosing as needed or add on Zetia if poor control.  Lipid check next visit.      Relevant Medications   losartan (COZAAR) 50 MG tablet     Nervous and Auditory   Nicotine dependence, cigarettes, w unsp disorders    I have recommended complete cessation of tobacco use. I have discussed various options available for assistance with tobacco cessation including over the counter methods (Nicotine gum, patch and lozenges). We also discussed prescription options (Chantix, Nicotine Inhaler / Nasal Spray). The patient is not interested in pursuing any prescription tobacco cessation options at this time.  Continue yearly lung screening.         Other   Obesity    BMI 34.96.  Recommended eating smaller high protein, low fat meals more frequently and exercising 30 mins a day 5 times a week with a goal of 10-15lb weight loss in the next 3 months. Patient voiced their understanding and motivation to adhere to these recommendations.       Sepsis ([place])    Acute and improving at this time, check CBC and CMP today.      Decreased urination    Suspect related to fluid loss with diarrhea, will check CMP, BNP, CBC, and send home to obtain urine sample.  Return in 2 weeks, sooner if worsening.      Relevant Orders   Urinalysis, Routine w reflex microscopic   Diarrhea    Acute, concern for c difficile after recent hospitalization.  Check stool cultures and  treat as needed.      Relevant Orders   Cdiff NAA+O+P+Stool Culture   Urinalysis, Routine w reflex microscopic   Localized edema    To BLE, suspect related to Amlodipine, but will check BNP today and order echo, which she was to obtain past with cardiology and has not obtained as of yet.  If any elevations or abnormalities start Lasix and get back into cardiology.  Has gained 15 pounds since 07/25/20.      Relevant Orders   B Nat Peptide   Comprehensive metabolic panel   TSH   ECHOCARDIOGRAM COMPLETE       Follow up plan: Return in about 2 weeks (around 08/28/2020) for COPD and EDEMA.

## 2020-08-14 NOTE — Assessment & Plan Note (Signed)
Chronic, ongoing.  Continue Crestor and adjust dosing as needed or add on Zetia if poor control.  Lipid check next visit.

## 2020-08-14 NOTE — Patient Instructions (Signed)

## 2020-08-14 NOTE — Assessment & Plan Note (Signed)
Acute, concern for c difficile after recent hospitalization.  Check stool cultures and treat as needed.

## 2020-08-14 NOTE — Assessment & Plan Note (Signed)
Ongoing, worsening after after recent exacerbation -- at this time continue oxygen, will work on getting portable O2 tank that will be easier for her to utilize.  Referral to pulmonary urgent due to recent exacerbation and worsening lung disease. 

## 2020-08-14 NOTE — Assessment & Plan Note (Addendum)
To BLE, suspect related to Amlodipine, but will check BNP today and order echo, which she was to obtain past with cardiology and has not obtained as of yet.  If any elevations or abnormalities start Lasix and get back into cardiology.  Has gained 15 pounds since 07/25/20.

## 2020-08-14 NOTE — Assessment & Plan Note (Signed)
Acute and improving at this time, check CBC and CMP today.

## 2020-08-14 NOTE — Assessment & Plan Note (Signed)
Ongoing, worsening after after recent exacerbation -- at this time continue oxygen, will work on getting portable O2 tank that will be easier for her to utilize.  Referral to pulmonary urgent due to recent exacerbation and worsening lung disease.

## 2020-08-15 NOTE — Progress Notes (Signed)
Contacted via MyChart   Good evening Matti I am waiting on one more lab and will let you know when it returns: - CBC is showing improvement in white blood cell count, coming down -- a good sign that infection is improving. - Thyroid is normal - Kidney function, creatinine and eGFR, is normal and liver function, AST and ALT, if normal.  This is good news!! - BNP is nice and normal, meaning most likely no heart failure.  I suspect the edema was from Amlodipine and suspect this should improve with you stopping this.  Any questions? Keep being awesome!!  Thank you for allowing me to participate in your care.  I appreciate you. Kindest regards, Yuuki Skeens

## 2020-08-17 ENCOUNTER — Other Ambulatory Visit: Payer: Self-pay | Admitting: Nurse Practitioner

## 2020-08-17 ENCOUNTER — Other Ambulatory Visit: Payer: Self-pay

## 2020-08-17 ENCOUNTER — Telehealth: Payer: Self-pay | Admitting: *Deleted

## 2020-08-17 DIAGNOSIS — R34 Anuria and oliguria: Secondary | ICD-10-CM | POA: Diagnosis not present

## 2020-08-17 DIAGNOSIS — R197 Diarrhea, unspecified: Secondary | ICD-10-CM | POA: Diagnosis not present

## 2020-08-17 LAB — URINALYSIS, ROUTINE W REFLEX MICROSCOPIC
Bilirubin, UA: NEGATIVE
Ketones, UA: NEGATIVE
Leukocytes,UA: NEGATIVE
Nitrite, UA: NEGATIVE
Protein,UA: NEGATIVE
RBC, UA: NEGATIVE
Specific Gravity, UA: 1.02 (ref 1.005–1.030)
Urobilinogen, Ur: 0.2 mg/dL (ref 0.2–1.0)
pH, UA: 5.5 (ref 5.0–7.5)

## 2020-08-17 MED ORDER — FUROSEMIDE 20 MG PO TABS: 10.0000 mg | ORAL_TABLET | Freq: Every day | ORAL | 0 refills | Status: DC

## 2020-08-17 NOTE — Telephone Encounter (Signed)
Called and spoke with patient to let her know that Dr. Melvyn Novas was ok with her keeping appointment for 09/08/20 based off of her last PCP note. Advised patient if she needs anything before that appointment to let us know and we would see what we could do. Patient expressed understanding. Nothing further needed at this time.

## 2020-08-17 NOTE — Addendum Note (Signed)
Addended by: Marnee Guarneri T on: 08/17/2020 11:52 AM   Modules accepted: Orders

## 2020-08-17 NOTE — Addendum Note (Signed)
Addended by: Rudie Meyer on: 08/17/2020 09:47 AM   Modules accepted: Orders

## 2020-08-17 NOTE — Telephone Encounter (Signed)
I have sent in a low dose of Lasix to help until Amlodipine out of system.  Please ensure she has stopped Amlodipine.  Also if worsening please let me know immediately or go directly to ER.

## 2020-08-17 NOTE — Telephone Encounter (Signed)
No problem with waiting based on last PCP entry as pt is improving from last aecopd flare

## 2020-08-17 NOTE — Telephone Encounter (Signed)
Dr. Wert, please advise. Thanks!  

## 2020-08-17 NOTE — Telephone Encounter (Signed)
Routing to provider to advise.  

## 2020-08-17 NOTE — Addendum Note (Signed)
Addended byRudie Meyer on: 08/17/2020 11:52 AM   Modules accepted: Orders

## 2020-08-17 NOTE — Telephone Encounter (Signed)
Tried calling patient. No answer and no VM. Will try to call again later.  ?

## 2020-08-17 NOTE — Telephone Encounter (Signed)
Patient returned call and was notified of Jolene's message. Patient states she has stopped the Amlodipine and verbalized understanding of Jolene's instructions.

## 2020-08-17 NOTE — Telephone Encounter (Signed)
Pt sister Rip Harbour came in and states that pt. is  having swelling in her feet, states they look like they are about to bust. She wants to know if it is something you can call in for the swelling. Please advise pt. [phone]

## 2020-08-19 LAB — CLOSTRIDIUM DIFFICILE EIA: C difficile Toxins A+B, EIA: NEGATIVE

## 2020-08-19 NOTE — Progress Notes (Signed)
Please let Cayla know her stool culture returned showing in infection, which is great news.  If ongoing symptoms I do want her to return to see me ASAP.

## 2020-08-24 ENCOUNTER — Ambulatory Visit: Payer: Self-pay

## 2020-08-24 NOTE — Telephone Encounter (Signed)
Pt. Reports she has increased heart rate while walking short distances x 3 weeks. Minimal exertion causes her heart rate to go up to 130. Sometimes has chest pain with this. Goes away with rest. O2 saturation 90-92%.Instructed to go to ED with worsening symptoms.  Requesting a cardiology referral Please advise pt.   Answer Assessment - Initial Assessment Questions 1. DESCRIPTION: "Please describe your heart rate or heartbeat that you are having" (e.g., fast/slow, regular/irregular, skipped or extra beats, "palpitations")     Heart rate goes up with exertion 2. ONSET: "When did it start?" (Minutes, hours or days)      3 weeks ago 3. DURATION: "How long does it last" (e.g., seconds, minutes, hours)     Minutes 4. PATTERN "Does it come and go, or has it been constant since it started?"  "Does it get worse with exertion?"   "Are you feeling it now?"     Exertion 5. TAP: "Using your hand, can you tap out what you are feeling on a chair or table in front of you, so that I can hear?" (Note: not all patients can do this)       No 6. HEART RATE: "Can you tell me your heart rate?" "How many beats in 15 seconds?"  (Note: not all patients can do this)       At rest -  90  Exertion  - 130 7. RECURRENT SYMPTOM: "Have you ever had this before?" If Yes, ask: "When was the last time?" and "What happened that time?"      Yes 8. CAUSE: "What do you think is causing the palpitations?"     Unsure 9. CARDIAC HISTORY: "Do you have any history of heart disease?" (e.g., heart attack, angina, bypass surgery, angioplasty, arrhythmia)      No 10. OTHER SYMPTOMS: "Do you have any other symptoms?" (e.g., dizziness, chest pain, sweating, difficulty breathing)       Chest will hurt when this happens 11. PREGNANCY: "Is there any chance you are pregnant?" "When was your last menstrual period?"       No  Protocols used: HEART RATE AND HEARTBEAT QUESTIONS-A-AH

## 2020-08-25 NOTE — Telephone Encounter (Signed)
Pt will be coming in office on 08/26/2020 verbalized understanding.

## 2020-08-26 ENCOUNTER — Ambulatory Visit (INDEPENDENT_AMBULATORY_CARE_PROVIDER_SITE_OTHER): Payer: Medicare Other | Admitting: Nurse Practitioner

## 2020-08-26 ENCOUNTER — Encounter: Payer: Self-pay | Admitting: Nurse Practitioner

## 2020-08-26 ENCOUNTER — Other Ambulatory Visit: Payer: Self-pay

## 2020-08-26 VITALS — BP 122/83 | HR 80 | Temp 98.5°F | Wt 221.4 lb

## 2020-08-26 DIAGNOSIS — R0602 Shortness of breath: Secondary | ICD-10-CM | POA: Diagnosis not present

## 2020-08-26 DIAGNOSIS — A419 Sepsis, unspecified organism: Secondary | ICD-10-CM | POA: Diagnosis not present

## 2020-08-26 DIAGNOSIS — F17219 Nicotine dependence, cigarettes, with unspecified nicotine-induced disorders: Secondary | ICD-10-CM | POA: Diagnosis not present

## 2020-08-26 DIAGNOSIS — R0601 Orthopnea: Secondary | ICD-10-CM | POA: Insufficient documentation

## 2020-08-26 DIAGNOSIS — R000 Tachycardia, unspecified: Secondary | ICD-10-CM | POA: Diagnosis not present

## 2020-08-26 DIAGNOSIS — R600 Localized edema: Secondary | ICD-10-CM

## 2020-08-26 DIAGNOSIS — J9601 Acute respiratory failure with hypoxia: Secondary | ICD-10-CM | POA: Diagnosis not present

## 2020-08-26 DIAGNOSIS — J432 Centrilobular emphysema: Secondary | ICD-10-CM

## 2020-08-26 DIAGNOSIS — I739 Peripheral vascular disease, unspecified: Secondary | ICD-10-CM | POA: Diagnosis not present

## 2020-08-26 LAB — TSH: TSH: 1.03 u[IU]/mL (ref 0.450–4.500)

## 2020-08-26 LAB — CBC WITH DIFFERENTIAL/PLATELET
Basophils Absolute: 0.1 10*3/uL (ref 0.0–0.2)
Basos: 1 %
EOS (ABSOLUTE): 0.2 10*3/uL (ref 0.0–0.4)
Eos: 2 %
Hematocrit: 37.5 % (ref 34.0–46.6)
Hemoglobin: 12.5 g/dL (ref 11.1–15.9)
Immature Grans (Abs): 0.1 10*3/uL (ref 0.0–0.1)
Immature Granulocytes: 1 %
Lymphocytes Absolute: 2.5 10*3/uL (ref 0.7–3.1)
Lymphs: 27 %
MCH: 29.4 pg (ref 26.6–33.0)
MCHC: 33.3 g/dL (ref 31.5–35.7)
MCV: 88 fL (ref 79–97)
Monocytes Absolute: 0.4 10*3/uL (ref 0.1–0.9)
Monocytes: 5 %
Neutrophils Absolute: 5.8 10*3/uL (ref 1.4–7.0)
Neutrophils: 64 %
Platelets: 262 10*3/uL (ref 150–450)
RBC: 4.25 x10E6/uL (ref 3.77–5.28)
RDW: 12.3 % (ref 11.7–15.4)
WBC: 9.1 10*3/uL (ref 3.4–10.8)

## 2020-08-26 LAB — COMPREHENSIVE METABOLIC PANEL
ALT: 27 IU/L (ref 0–32)
AST: 22 IU/L (ref 0–40)
Albumin/Globulin Ratio: 2.3 — ABNORMAL HIGH (ref 1.2–2.2)
Albumin: 4.2 g/dL (ref 3.8–4.8)
Alkaline Phosphatase: 91 IU/L (ref 44–121)
BUN/Creatinine Ratio: 16 (ref 12–28)
BUN: 12 mg/dL (ref 8–27)
Bilirubin Total: 0.2 mg/dL (ref 0.0–1.2)
CO2: 25 mmol/L (ref 20–29)
Calcium: 9.1 mg/dL (ref 8.7–10.3)
Chloride: 100 mmol/L (ref 96–106)
Creatinine, Ser: 0.76 mg/dL (ref 0.57–1.00)
Globulin, Total: 1.8 g/dL (ref 1.5–4.5)
Glucose: 163 mg/dL — ABNORMAL HIGH (ref 65–99)
Potassium: 4 mmol/L (ref 3.5–5.2)
Sodium: 140 mmol/L (ref 134–144)
Total Protein: 6 g/dL (ref 6.0–8.5)
eGFR: 89 mL/min/{1.73_m2} (ref >59)

## 2020-08-26 LAB — BRAIN NATRIURETIC PEPTIDE: BNP: 25 pg/mL (ref 0.0–100.0)

## 2020-08-26 LAB — ALPHA-1-ANTITRYPSIN DEFICIENCY

## 2020-08-26 NOTE — Assessment & Plan Note (Signed)
Suspect related to emphysema with recent exacerbation and deconditioning.  Is scheduled to see pulmonary June 7th, this will be beneficial and appreciate their input.  EKG in office today reassuring.  Will get her back into cardiology.

## 2020-08-26 NOTE — Assessment & Plan Note (Signed)
Improving at this time with discontinuation of Amlodipine.

## 2020-08-26 NOTE — Progress Notes (Signed)
BP 122/83   Pulse 80   Temp 98.5 F (36.9 C) (Oral)   Wt 221 lb 6.4 oz (100.4 kg)   SpO2 95%   BMI 34.68 kg/m    Subjective:    Patient ID: Stacey Gross, female    DOB: 10-Dec-1960, 60 y.o.   MRN: [id number]  HPI: Stacey Gross is a 60 y.o. female  Chief Complaint  Patient presents with  . COPD  . Edema    Patient states she is taking the medication and see a bit of a difference in the swelling.   . Irregular Heart Beat    patient states she me doing the EKG isn't going to show anything because it is only happens when she is up and moving around and it can be short distances. Patient states it has been as high as 130's. Patient states she leaves a feeling of having to vomit or burp.   COPD Continues to smoke 1/2 PPD (was smoking 3 PPD), not interested inquitting -- after this 1/2 a pack she plans on quitting . Went for lung CT screening February25th,2021. Current medications includeBreztriand Albuterol. Stopped Amlodipine last visit and some improvement in swelling.    She reports increased heart rate with short distance walking, has been this way since leaving hospital with exacerbation -- feels like has a gas bubble there -- felt like elephant on chest --yesterday had episode chest hurting so bad had to throw up.  HR with moving 130-140.  She denies feeling winded -- at night sleeps on three pillows, this is one more then her usual -- reports O2 drops lower while in bed -- has dropped to 40% due to her hitting O2 off while sleeping.   COPD status:stable Satisfied with current treatment?:yes Oxygen use:yes Dyspnea frequency: present Cough frequency: at baseline Rescue inhaler frequency: every day Limitation of activity:yes Productive cough: none Last Spirometry:unknown Pneumovax:Up to Date Influenza:Up to Date  Relevant past medical, surgical, family and social history reviewed and updated as indicated. Interim medical history since our last visit  reviewed. Allergies and medications reviewed and updated.  Review of Systems  Constitutional: Negative for activity change, appetite change, diaphoresis, fatigue and fever.  Respiratory: Positive for cough (baseline) and shortness of breath. Negative for chest tightness.   Cardiovascular: Positive for chest pain (occasional, better with TUMS). Negative for palpitations and leg swelling.  Gastrointestinal: Positive for diarrhea. Negative for abdominal distention, abdominal pain, blood in stool, constipation, nausea and vomiting.  Endocrine: Negative for polydipsia, polyphagia and polyuria.  Genitourinary: Positive for decreased urine volume. Negative for dysuria, frequency, hematuria and urgency.  Neurological: Negative.   Psychiatric/Behavioral: Negative for decreased concentration, sleep disturbance and suicidal ideas. The patient is not nervous/anxious.     Per HPI unless specifically indicated above     Objective:    BP 122/83   Pulse 80   Temp 98.5 F (36.9 C) (Oral)   Wt 221 lb 6.4 oz (100.4 kg)   SpO2 95%   BMI 34.68 kg/m   Wt Readings from Last 3 Encounters:  08/26/20 221 lb 6.4 oz (100.4 kg)  08/14/20 223 lb 3.2 oz (101.2 kg)  07/25/20 208 lb (94.3 kg)    Physical Exam Vitals and nursing note reviewed.  Constitutional:      General: She is awake.     Appearance: She is well-developed and well-groomed. She is obese.  HENT:     Head: Normocephalic.     Right Ear: Hearing normal.  Left Ear: Hearing normal.  Eyes:     General: Lids are normal.        Right eye: No discharge.        Left eye: No discharge.     Conjunctiva/sclera: Conjunctivae normal.     Pupils: Pupils are equal, round, and reactive to light.  Neck:     Thyroid: No thyromegaly.     Vascular: No carotid bruit.  Cardiovascular:     Rate and Rhythm: Normal rate and regular rhythm.     Heart sounds: Normal heart sounds. No murmur heard. No gallop.   Pulmonary:     Effort: Pulmonary effort is  normal. No accessory muscle usage or respiratory distress.     Breath sounds: Decreased breath sounds and wheezing present. No rhonchi.     Comments: Decreased lung sounds per baseline with expiratory wheezes throughout, no rhonchi noted.  No SOB with talking. Abdominal:     General: Bowel sounds are normal. There is no distension.     Palpations: Abdomen is soft. There is no hepatomegaly.     Tenderness: There is no abdominal tenderness. There is no right CVA tenderness or left CVA tenderness.  Musculoskeletal:     Cervical back: Normal range of motion and neck supple.     Right lower leg: 1+ Edema present.     Left lower leg: 1+ Edema present.  Skin:    General: Skin is warm and dry.     Comments: Scattered pale purple bruising bilateral upper extremities.  Neurological:     Mental Status: She is alert and oriented to person, place, and time.  Psychiatric:        Attention and Perception: Attention normal.        Mood and Affect: Mood normal.        Speech: Speech normal.        Behavior: Behavior normal. Behavior is cooperative.        Thought Content: Thought content normal.   EKG My review and personal interpretation at Time: 1445    Indication: SOB Rate: 88  Rhythm: sinus Axis: normal Other: No nonspecific st abn, no stemi Results for orders placed or performed in visit on 08/17/20  Urinalysis, Routine w reflex microscopic  Result Value Ref Range   Specific Gravity, UA 1.020 1.005 - 1.030   pH, UA 5.5 5.0 - 7.5   Color, UA Yellow Yellow   Appearance Ur Cloudy (A) Clear   Leukocytes,UA Negative Negative   Protein,UA Negative Negative/Trace   Glucose, UA 2+ (A) Negative   Ketones, UA Negative Negative   RBC, UA Negative Negative   Bilirubin, UA Negative Negative   Urobilinogen, Ur 0.2 0.2 - 1.0 mg/dL   Nitrite, UA Negative Negative      Assessment & Plan:   Problem List Items Addressed This Visit      Respiratory   Centrilobular emphysema (HCC) - Primary     Chronic, ongoing.  Continue Albuterol as needed and Breztri -- is tolerating well.  Recommend complete cessation of smoking.  Continue annual CT scans.  Sees pulmonary June 7th.          Nervous and Auditory   Nicotine dependence, cigarettes, w unsp disorders    I have recommended complete cessation of tobacco use. I have discussed various options available for assistance with tobacco cessation including over the counter methods (Nicotine gum, patch and lozenges). We also discussed prescription options (Chantix, Nicotine Inhaler / Nasal Spray). The patient is not  interested in pursuing any prescription tobacco cessation options at this time.  Continue yearly lung screening.         Other   Localized edema    Improving at this time with discontinuation of Amlodipine.      Shortness of breath    Suspect related to emphysema with recent exacerbation and deconditioning.  Is scheduled to see pulmonary June 7th, this will be beneficial and appreciate their input.  EKG in office today reassuring.  Will get her back into cardiology.      Relevant Orders   EKG 12-Lead (Completed)   Tachycardia    Acute episodes with activity post exacerbation.  Echo ordered last visit and awaiting to schedule, have reached out to referral team on this.  EKG in office today reassuring with no tachycardia or a-fib noted.  Will get her back into cardiology and have highly recommended she perform stress testing they recommended last visit.  If any worsening tachycardia or CP immediately go to ER.          Follow up plan: Return in about 2 weeks (around 09/09/2020) for COPD and Heart follow-up.

## 2020-08-26 NOTE — Assessment & Plan Note (Signed)
I have recommended complete cessation of tobacco use. I have discussed various options available for assistance with tobacco cessation including over the counter methods (Nicotine gum, patch and lozenges). We also discussed prescription options (Chantix, Nicotine Inhaler / Nasal Spray). The patient is not interested in pursuing any prescription tobacco cessation options at this time.  Continue yearly lung screening.  

## 2020-08-26 NOTE — Assessment & Plan Note (Signed)
Acute episodes with activity post exacerbation.  Echo ordered last visit and awaiting to schedule, have reached out to referral team on this.  EKG in office today reassuring with no tachycardia or a-fib noted.  Will get her back into cardiology and have highly recommended she perform stress testing they recommended last visit.  If any worsening tachycardia or CP immediately go to ER.

## 2020-08-26 NOTE — Patient Instructions (Signed)

## 2020-08-26 NOTE — Assessment & Plan Note (Signed)
Chronic, ongoing.  Continue Albuterol as needed and Breztri -- is tolerating well.  Recommend complete cessation of smoking.  Continue annual CT scans.  Sees pulmonary June 7th.

## 2020-08-27 ENCOUNTER — Encounter: Payer: Self-pay | Admitting: Cardiology

## 2020-08-27 ENCOUNTER — Ambulatory Visit: Payer: Medicare Other | Admitting: Cardiology

## 2020-08-27 ENCOUNTER — Ambulatory Visit (INDEPENDENT_AMBULATORY_CARE_PROVIDER_SITE_OTHER): Payer: Medicare Other

## 2020-08-27 VITALS — BP 114/70 | HR 79 | Ht 67.0 in | Wt 221.0 lb

## 2020-08-27 DIAGNOSIS — R600 Localized edema: Secondary | ICD-10-CM | POA: Diagnosis not present

## 2020-08-27 DIAGNOSIS — I10 Essential (primary) hypertension: Secondary | ICD-10-CM

## 2020-08-27 DIAGNOSIS — R002 Palpitations: Secondary | ICD-10-CM | POA: Diagnosis not present

## 2020-08-27 DIAGNOSIS — F17200 Nicotine dependence, unspecified, uncomplicated: Secondary | ICD-10-CM

## 2020-08-27 NOTE — Progress Notes (Signed)
Cardiology Office Note:    Date:  08/27/2020   ID:  Stacey Gross, DOB 29-Sep-1960, MRN [id number]  PCP:  Venita Lick, NP  [facility] Cardiologist:  Kate Sable, MD  [facility] Electrophysiologist:  None   Referring MD: Venita Lick, NP   Chief Complaint  Patient presents with  . Other    Per PCP For Tachycardio, SOB, swelling in ankles, and Chest pressure. Meds reviewed verbally with patient.     History of Present Illness:    Stacey Gross is a 60 y.o. female with a hx of hypertension, diabetes, hyperlipidemia, COPD on 2 L oxygen, current smoker x40+ years who presents due to palpitations, edema.  Patient states having symptoms of heart pounding, increased heart rates with minimal exertion over the past month.  Heart rates did go up to 130 bpm.  Symptoms are associated with some chest discomfort.  Also noticed lower extremity edema which worsens as the day progresses.  Was started on Lasix with good effect, although symptoms typically worsen by the end of the day.  She typically sits with her legs down.  She still smokes, is working on quitting.  Prior notes Coronary CTA 01/23/2020 calcium score 0, no evidence of CAD.    Past Medical History:  Diagnosis Date  . Asthma   . Cancer ([place])    stomach  . Chronic back pain   . COPD (chronic obstructive pulmonary disease) ([place])   . Degenerative joint disease (DJD) of hip   . Diabetes mellitus without complication ([place])   . Dyspnea   . GERD (gastroesophageal reflux disease)    takes Omeprazole daily  . History of bronchitis yrs ago  . History of colon polyps    benign  . History of staph infection 10 yrs ago  . Hyperlipidemia    takes Atorvastatin daily  . Hypertension   . Peripheral vascular disease ([place])   . Tobacco use    8 cigarettes daily   . Weakness    numbness and tingling in both legs     Past Surgical History:  Procedure Laterality Date  . ABDOMINAL HYSTERECTOMY     total  .  APPENDECTOMY    . BACK SURGERY  9-10 yrs ago  . BREAST BIOPSY Right    Core- neg  . COLONOSCOPY    . COLONOSCOPY WITH PROPOFOL N/A 09/20/2017   Procedure: COLONOSCOPY WITH PROPOFOL;  Surgeon: Virgel Manifold, MD;  Location: [facility] ENDOSCOPY;  Service: Endoscopy;  Laterality: N/A;  . COLONOSCOPY WITH PROPOFOL N/A 04/24/2018   Procedure: COLONOSCOPY WITH PROPOFOL;  Surgeon: Lucilla Lame, MD;  Location: [facility] ENDOSCOPY;  Service: Endoscopy;  Laterality: N/A;  . ESOPHAGOGASTRODUODENOSCOPY (EGD) WITH PROPOFOL N/A 09/20/2017   Procedure: ESOPHAGOGASTRODUODENOSCOPY (EGD) WITH PROPOFOL;  Surgeon: Virgel Manifold, MD;  Location: [facility] ENDOSCOPY;  Service: Endoscopy;  Laterality: N/A;  . ESOPHAGOGASTRODUODENOSCOPY (EGD) WITH PROPOFOL N/A 11/20/2018   Procedure: ESOPHAGOGASTRODUODENOSCOPY (EGD) WITH PROPOFOL;  Surgeon: Lucilla Lame, MD;  Location: [facility] ENDOSCOPY;  Service: Endoscopy;  Laterality: N/A;  . PERIPHERAL VASCULAR CATHETERIZATION Left 03/17/2016   Procedure: Lower Extremity Angiography;  Surgeon: Algernon Huxley, MD;  Location: [facility];  Service: Cardiovascular;  Laterality: Left;  . TOTAL SHOULDER ARTHROPLASTY Right 07/24/2019   Procedure: RIGHT TOTAL SHOULDER ARTHROPLASTY;  Surgeon: Lovell Sheehan, MD;  Location: [facility] ORS;  Service: Orthopedics;  Laterality: Right;    Current Medications: Current Meds  Medication Sig  . albuterol (PROVENTIL) (2.5 MG/3ML) 0.083% nebulizer solution USE  1 VIAL (3 ML = 2.5 MG TOTAL) BY NEBULIZATION EVERY 6 HOURS AS NEEDED FORWHEEZING OR SHORTNESS OF BREATH  . albuterol (VENTOLIN HFA) 108 (90 Base) MCG/ACT inhaler INHALE 2 PUFFS BY MOUTH INTO THE LUNGS EVERY 6 HOURS AS NEEDED FOR WHEEZING OR SHORTNESS OF BREATH  . benzonatate (TESSALON) 200 MG capsule Take 1 capsule (200 mg total) by mouth 2 (two) times daily as needed for cough.  . Blood Glucose Monitoring Suppl (ONE TOUCH ULTRA SYSTEM KIT) W/DEVICE KIT 1 kit by Does not apply route once.  .  Budeson-Glycopyrrol-Formoterol (BREZTRI AEROSPHERE) 160-9-4.8 MCG/ACT AERO Inhale 2 puffs into the lungs 2 (two) times daily.  . cilostazol (PLETAL) 100 MG tablet Take 1 tablet (100 mg total) by mouth 2 (two) times daily before a meal.  . citalopram (CELEXA) 20 MG tablet Take 1 tablet (20 mg total) by mouth daily.  . clobetasol (TEMOVATE) 0.05 % external solution Patient to mix solution in 1 jar of CeraVe Cream. Apply to itchy rash twice daily until improved.  . cyclobenzaprine (FLEXERIL) 10 MG tablet TAKE ONE TABLET BY MOUTH AT BEDTIME  . furosemide (LASIX) 20 MG tablet Take 20 mg by mouth daily.  Marland Kitchen glucose blood test strip Use to check blood sugar three times a day.  . hydrOXYzine (ATARAX/VISTARIL) 25 MG tablet Take 1 tablet (25 mg total) by mouth as directed. Take 1-2 tablets by mouth every 4-6 hours as needed for itch. MAY MAKE DROWSY  . losartan (COZAAR) 50 MG tablet Take 1 tablet (50 mg total) by mouth daily.  . meloxicam (MOBIC) 15 MG tablet Take 1 tablet (15 mg total) by mouth daily.  . metFORMIN (GLUCOPHAGE) 500 MG tablet Please take 1000 mg oral twice daily for next 10 days, then decrease to 5:30 100 mg oral twice daily thereafter.  . nicotine (NICODERM CQ - DOSED IN MG/24 HOURS) 21 mg/24hr patch Place 1 patch (21 mg total) onto the skin daily.  Marland Kitchen nystatin (MYCOSTATIN) 100000 UNIT/ML suspension TAKE 5 ML BY MOUTH 4 TIMES DAILY  . OZEMPIC, 0.25 OR 0.5 MG/DOSE, 2 MG/1.5ML SOPN INJECT 0.5 MG INTO THE SKIN ONCE A WEEK  . pantoprazole (PROTONIX) 40 MG tablet Take 1 tablet (40 mg total) by mouth daily.  . predniSONE (DELTASONE) 10 MG tablet Please take 50 mg oral daily x3 days, then decrease to 30 mg oral daily x3 days, then 20 mg oral daily x 3 days, then 10 mg oral x2 days then stop.  . rosuvastatin (CRESTOR) 20 MG tablet Take 1 tablet (20 mg total) by mouth daily.  . traZODone (DESYREL) 100 MG tablet TAKE 1 TABLET BY MOUTH AT BEDTIME AS NEEDED FOR SLEEP  . vitamin B-12 (CYANOCOBALAMIN) 1000  MCG tablet Take 1 tablet (1,000 mcg total) by mouth daily.     Allergies:   Doxycycline hyclate, Acetaminophen, Aspirin, Latex, and Vancomycin   Social History   Socioeconomic History  . Marital status: Widowed    Spouse name: Not on file  . Number of children: 0  . Years of education: 8  . Highest education level: Not on file  Occupational History  . Occupation: disabled  Tobacco Use  . Smoking status: Current Every Day Smoker    Packs/day: 2.00    Years: 48.00    Pack years: 96.00    Types: Cigarettes  . Smokeless tobacco: Never Used  Vaping Use  . Vaping Use: Never used  Substance and Sexual Activity  . Alcohol use: No  . Drug use: No  .  Sexual activity: Not on file  Other Topics Concern  . Not on file  Social History Narrative  . Not on file   Social Determinants of Health   Financial Resource Strain: Low Risk   . Difficulty of Paying Living Expenses: Not hard at all  Food Insecurity: No Food Insecurity  . Worried About Charity fundraiser in the Last Year: Never true  . Ran Out of Food in the Last Year: Never true  Transportation Needs: No Transportation Needs  . Lack of Transportation (Medical): No  . Lack of Transportation (Non-Medical): No  Physical Activity: Inactive  . Days of Exercise per Week: 0 days  . Minutes of Exercise per Session: 0 min  Stress: No Stress Concern Present  . Feeling of Stress : Not at all  Social Connections: Not on file     Family History: The patient's family history includes Hyperlipidemia in her father and mother; Melanoma in her sister. There is no history of Breast cancer.  ROS:   Please see the history of present illness.     All other systems reviewed and are negative.  EKGs/Labs/Other Studies Reviewed:    The following studies were reviewed today:   EKG:  EKG not ordered today.   Recent Labs: 08/14/2020: ALT 27; BNP 25.0; BUN 12; Creatinine, Ser 0.76; Hemoglobin 12.5; Platelets 262; Potassium 4.0; Sodium 140;  TSH 1.030  Recent Lipid Panel    Component Value Date/Time   CHOL 181 05/13/2020 0934   CHOL 194 02/22/2017 0847   TRIG 211 (H) 05/13/2020 0934   TRIG 227 (H) 02/22/2017 0847   HDL 44 05/13/2020 0934   CHOLHDL 4.1 05/13/2020 0934   VLDL 45 (H) 02/22/2017 0847   LDLCALC 101 (H) 05/13/2020 0934    Physical Exam:    VS:  BP 114/70 (BP Location: Left Arm, Patient Position: Sitting, Cuff Size: Normal)   Pulse 79   Ht _0  (1.702 m)   Wt 221 lb (100.2 kg)   SpO2 92%   BMI 34.61 kg/m     Wt Readings from Last 3 Encounters:  08/27/20 221 lb (100.2 kg)  08/26/20 221 lb 6.4 oz (100.4 kg)  08/14/20 223 lb 3.2 oz (101.2 kg)     GEN:  Well nourished, well developed in no acute distress HEENT: Normal NECK: No JVD; No carotid bruits LYMPHATICS: No lymphadenopathy CARDIAC: RRR, no murmurs, rubs, gallops RESPIRATORY: Bilateral wheezing noted ABDOMEN: Soft, non-tender, non-distended MUSCULOSKELETAL: Trace edema; No deformity  SKIN: Warm and dry NEUROLOGIC:  Alert and oriented x 3 PSYCHIATRIC:  Normal affect   ASSESSMENT:    1. Palpitations   2. Leg edema   3. Smoking   4. Primary hypertension    PLAN:    In order of problems listed above:  1. Palpitations, heart pounding.  Placed 2-week cardiac monitor to evaluate any significant arrhythmias.  May have inappropriate sinus tachycardia.  Will consider AV nodal blocker/calcium channel blocker if this is confirmed.  Avoiding beta-blockers due to pulmonary disease.  2. Leg edema, likely dependent.  Obtain echo to evaluate systolic and diastolic function.  Continue Lasix for now.  Leg raise while in seated position advised. 3. Current smoker, cessation advised. 4. History of hypertension, BP controlled, continue losartan.  Follow-up after cardiac monitor.   Medication Adjustments/Labs and Tests Ordered: Current medicines are reviewed at length with the patient today.  Concerns regarding medicines are outlined above.  Orders  Placed This Encounter  Procedures  . LONG TERM MONITOR-LIVE  TELEMETRY (3-14 DAYS)  . ECHOCARDIOGRAM COMPLETE   No orders of the defined types were placed in this encounter.   Patient Instructions  Medication Instructions:  Please continue your current medications   *If you need a refill on your cardiac medications before your next appointment, please call your pharmacy*   Lab Work: None  Testing/Procedures: Your physician has requested that you have an echocardiogram. Echocardiography is a painless test that uses sound waves to create images of your heart. It provides your doctor with information about the size and shape of your heart and how well your heart's chambers and valves are working. This procedure takes approximately one hour. There are no restrictions for this procedure.  There is a possibility that an IV may need to be started during your test to inject an image enhancing agent. This is done to obtain more optimal pictures of your heart. Therefore we ask that you do at least drink some water prior to coming in to hydrate your veins.   Heart monitor (Zio patch) for 2 weeks (14 days)   Your physician has recommended that you wear a Zio monitor. This monitor is a medical device that records the heart's electrical activity. Doctors most often use these monitors to diagnose arrhythmias. Arrhythmias are problems with the speed or rhythm of the heartbeat. The monitor is a small device applied to your chest. You can wear one while you do your normal daily activities. While wearing this monitor if you have any symptoms to push the button and record what you felt. Once you have worn this monitor for the period of time provider prescribed (Usually 14 days), you will return the monitor device in the postage paid box. Once it is returned they will download the data collected and provide [place] with a report which the provider will then review and we will call you with those results. Important  tips:  1. Avoid showering during the first 24 hours of wearing the monitor. 2. Avoid excessive sweating to help maximize wear time. 3. Do not submerge the device, no hot tubs, and no swimming pools. 4. Keep any lotions or oils away from the patch. 5. After 24 hours you may shower with the patch on. Take brief showers with your back facing the shower head.  6. Do not remove patch once it has been placed because that will interrupt data and decrease adhesive wear time. 7. Push the button when you have any symptoms and write down what you were feeling. 8. Once you have completed wearing your monitor, remove and place into box which has postage paid and place in your outgoing mailbox.  9. If for some reason you have misplaced your box then call our office and we can provide another box and/or mail it off for you.   Follow-Up:  Your next appointment:   Follow-up after your heart monitor and echo  The format for your next appointment:   In Person  Provider:   You may see Kate Sable, MD or one of the following Advanced Practice Providers on your designated Care Team:    Murray Hodgkins, NP  Christell Faith, PA-C  Marrianne Mood, PA-C  Cadence Jackson, [place]  Laurann Montana, NP     Signed, Kate Sable, MD  08/27/2020 1:09 PM    [place]

## 2020-08-27 NOTE — Patient Instructions (Signed)
Medication Instructions:  Please continue your current medications   *If you need a refill on your cardiac medications before your next appointment, please call your pharmacy*   Lab Work: None  Testing/Procedures: Your physician has requested that you have an echocardiogram. Echocardiography is a painless test that uses sound waves to create images of your heart. It provides your doctor with information about the size and shape of your heart and how well your heart's chambers and valves are working. This procedure takes approximately one hour. There are no restrictions for this procedure.  There is a possibility that an IV may need to be started during your test to inject an image enhancing agent. This is done to obtain more optimal pictures of your heart. Therefore we ask that you do at least drink some water prior to coming in to hydrate your veins.   Heart monitor (Zio patch) for 2 weeks (14 days)   Your physician has recommended that you wear a Zio monitor. This monitor is a medical device that records the heart's electrical activity. Doctors most often use these monitors to diagnose arrhythmias. Arrhythmias are problems with the speed or rhythm of the heartbeat. The monitor is a small device applied to your chest. You can wear one while you do your normal daily activities. While wearing this monitor if you have any symptoms to push the button and record what you felt. Once you have worn this monitor for the period of time provider prescribed (Usually 14 days), you will return the monitor device in the postage paid box. Once it is returned they will download the data collected and provide [place] with a report which the provider will then review and we will call you with those results. Important tips:  1. Avoid showering during the first 24 hours of wearing the monitor. 2. Avoid excessive sweating to help maximize wear time. 3. Do not submerge the device, no hot tubs, and no swimming  pools. 4. Keep any lotions or oils away from the patch. 5. After 24 hours you may shower with the patch on. Take brief showers with your back facing the shower head.  6. Do not remove patch once it has been placed because that will interrupt data and decrease adhesive wear time. 7. Push the button when you have any symptoms and write down what you were feeling. 8. Once you have completed wearing your monitor, remove and place into box which has postage paid and place in your outgoing mailbox.  9. If for some reason you have misplaced your box then call our office and we can provide another box and/or mail it off for you.   Follow-Up:  Your next appointment:   Follow-up after your heart monitor and echo  The format for your next appointment:   In Person  Provider:   You may see Kate Sable, MD or one of the following Advanced Practice Providers on your designated Care Team:    Murray Hodgkins, NP  Christell Faith, PA-C  Marrianne Mood, PA-C  Cadence Lloyd, [place]  Laurann Montana, NP

## 2020-08-28 DIAGNOSIS — A419 Sepsis, unspecified organism: Secondary | ICD-10-CM | POA: Diagnosis not present

## 2020-08-28 DIAGNOSIS — I739 Peripheral vascular disease, unspecified: Secondary | ICD-10-CM | POA: Diagnosis not present

## 2020-08-28 DIAGNOSIS — J9601 Acute respiratory failure with hypoxia: Secondary | ICD-10-CM | POA: Diagnosis not present

## 2020-09-03 ENCOUNTER — Ambulatory Visit: Payer: Medicare Other | Admitting: Nurse Practitioner

## 2020-09-08 ENCOUNTER — Ambulatory Visit: Payer: Medicare Other | Admitting: Internal Medicine

## 2020-09-08 ENCOUNTER — Encounter: Payer: Self-pay | Admitting: Internal Medicine

## 2020-09-08 ENCOUNTER — Other Ambulatory Visit: Payer: Self-pay

## 2020-09-08 VITALS — BP 116/66 | HR 82 | Temp 97.7°F | Ht 67.0 in | Wt 216.6 lb

## 2020-09-08 DIAGNOSIS — R0602 Shortness of breath: Secondary | ICD-10-CM

## 2020-09-08 DIAGNOSIS — F17210 Nicotine dependence, cigarettes, uncomplicated: Secondary | ICD-10-CM | POA: Diagnosis not present

## 2020-09-08 DIAGNOSIS — J449 Chronic obstructive pulmonary disease, unspecified: Secondary | ICD-10-CM | POA: Diagnosis not present

## 2020-09-08 MED ORDER — BREZTRI AEROSPHERE 160-9-4.8 MCG/ACT IN AERO: 2.0000 | INHALATION_SPRAY | Freq: Two times a day (BID) | RESPIRATORY_TRACT | 0 refills | Status: DC

## 2020-09-08 NOTE — Progress Notes (Signed)
Stacey Gross, female    DOB: 1960/05/10    MRN: [id number]   Brief patient profile:  1 yowf active smoker from [place] arrived in [place] age 60 never IUP and new onset breathing problems age 70 rx albuterol then various others only neb helped avg use twice daily even using multiple maint including breztri then admitted:    Admit date: 07/25/2020 Discharge date: 07/28/2020  Home Health:NO Equipment/Devices:oxygen 2 L    Acute respiratory failure with hypoxia due toCOPD exacerbation: -She is with nooxygenrequirement at baseline, she will be discharged on home oxygen.   -Presents with no evidence of pneumonia on chest x-ray, but she reports significant cough, productive with phlegm, she was treated with IV steroids, IV Rocephin and azithromycin, with significant wheezing, remains on IV steroids during hospital stay, this has improved, she will be discharged on prolonged prednisone taper, already has nebulizer machine at home, home oxygen has been arranged on discharge, she was encouraged use incentive spirometry and flutter valve.  - Sepsis ruled out  Hyperlipidemia associated with type 2 diabetes mellitus ([place]) -Crestor  Hypertension associated with diabetes ([place]) -blood pressure remains elevated on home Cozaar, so she was started on 10 mg of Norvasc.  GERD (gastroesophageal reflux disease) -Protonix  Depression -Continue home medication,Celexa  Type 2 diabetes, controlled, with peripheral circulatory disorder ([place]):  - Recent A1c 6.6, well controlled. Patient taking Ozempic and metformin -Her CBG is currently uncontrolled due to steroids, required Lantus during hospital stay, she was instructed to increase her metformin for next week till she is off steroids.    Tobacco abuse -Nicotine patch, she is counseled and reports she is determined to quit smoking.  PVD (peripheral vascular disease) (HCC) -Cilostazol   Discharge Diagnoses:  COPD exacerbation   Hyperlipidemia associated with type 2 diabetes mellitus ([place])  Hypertension associated with diabetes ([place])  GERD (gastroesophageal reflux disease)  Depression  Type 2 diabetes, controlled, with peripheral circulatory disorder (HCC)   Acute respiratory failure with hypoxia (HCC)   Tobacco abuse   PVD (peripheral vascular disease) ([place])   Sepsis ([place])      History of Present Illness  09/08/2020  Pulmonary/ 1st office eval/Stacey Gross doe/ still smoking/ refuses more 02  Chief Complaint  Patient presents with  . Pulmonary Consult    Referred by Marnee Guarneri, NP. Pt c/o DOE for the past 20 years. States that she gets winded walking 5 ft. She states had PNA end of April 2022.   Dyspnea:  On best days until 3 months prior to pna could do  food 1-2 aisles / hc parking  Cough: am congestion x 30 min white mucus   Sleep: on side flat 3 pillows under head no noct resp cc SABA use: not yet   No obvious patterns in day to day or daytime variability or assoc  purulent sputum or mucus plugs or hemoptysis or cp or chest tightness, subjective wheeze or overt sinus or hb symptoms.   sleeping without nocturnal   exacerbation  of respiratory  c/o's or need for noct saba. Also denies any obvious fluctuation of symptoms with weather or environmental changes or other aggravating or alleviating factors except as outlined above   No unusual exposure hx or h/o childhood pna/ asthma or knowledge of premature birth.  Current Allergies, Complete Past Medical History, Past Surgical History, Family History, and Social History were reviewed in [employer] record.  ROS  The following are not active complaints unless bolded Hoarseness, sore throat,  dysphagia, dental problems, itching, sneezing,  nasal congestion or discharge of excess mucus or purulent secretions, ear ache,   fever, chills, sweats, unintended wt loss or wt gain, classically pleuritic or exertional cp,  orthopnea pnd or arm/hand  swelling  or leg swelling, presyncope, palpitations, abdominal pain, anorexia, nausea, vomiting, diarrhea  or change in bowel habits or change in bladder habits, change in stools or change in urine, dysuria, hematuria,  rash, arthralgias, visual complaints, headache, numbness, weakness or ataxia or problems with walking or coordination,  change in mood or  memory.           Past Medical History:  Diagnosis Date  . Asthma   . Cancer ([place])    stomach  . Chronic back pain   . COPD (chronic obstructive pulmonary disease) ([place])   . Degenerative joint disease (DJD) of hip   . Diabetes mellitus without complication ([place])   . Dyspnea   . GERD (gastroesophageal reflux disease)    takes Omeprazole daily  . History of bronchitis yrs ago  . History of colon polyps    benign  . History of staph infection 10 yrs ago  . Hyperlipidemia    takes Atorvastatin daily  . Hypertension   . Peripheral vascular disease ([place])   . Tobacco use    8 cigarettes daily   . Weakness    numbness and tingling in both legs     Outpatient Medications Prior to Visit  Medication Sig Dispense Refill  . albuterol (PROVENTIL) (2.5 MG/3ML) 0.083% nebulizer solution USE 1 VIAL (3 ML = 2.5 MG TOTAL) BY NEBULIZATION EVERY 6 HOURS AS NEEDED FORWHEEZING OR SHORTNESS OF BREATH 180 mL 4  . albuterol (VENTOLIN HFA) 108 (90 Base) MCG/ACT inhaler INHALE 2 PUFFS BY MOUTH INTO THE LUNGS EVERY 6 HOURS AS NEEDED FOR WHEEZING OR SHORTNESS OF BREATH 18 g 5  . Blood Glucose Monitoring Suppl (ONE TOUCH ULTRA SYSTEM KIT) W/DEVICE KIT 1 kit by Does not apply route once. 100 each 12  . Budeson-Glycopyrrol-Formoterol (BREZTRI AEROSPHERE) 160-9-4.8 MCG/ACT AERO Inhale 2 puffs into the lungs 2 (two) times daily. 10.7 g 3  . citalopram (CELEXA) 20 MG tablet Take 1 tablet (20 mg total) by mouth daily. 30 tablet 3  . cyclobenzaprine (FLEXERIL) 10 MG tablet TAKE ONE TABLET BY MOUTH AT BEDTIME 30 tablet 3  . furosemide (LASIX) 20 MG tablet Take 20  mg by mouth daily.    Marland Kitchen glucose blood test strip Use to check blood sugar three times a day. 200 each 12  . hydrOXYzine (ATARAX/VISTARIL) 25 MG tablet Take 1 tablet (25 mg total) by mouth as directed. Take 1-2 tablets by mouth every 4-6 hours as needed for itch. MAY MAKE DROWSY 30 tablet 0  . losartan (COZAAR) 50 MG tablet Take 1 tablet (50 mg total) by mouth daily. 90 tablet 4  . meloxicam (MOBIC) 15 MG tablet Take 1 tablet (15 mg total) by mouth daily. 30 tablet 3  . metFORMIN (GLUCOPHAGE) 500 MG tablet Please take 1000 mg oral twice daily for next 10 days, then decrease to 5:30 100 mg oral twice daily thereafter. 180 tablet 1  . nystatin (MYCOSTATIN) 100000 UNIT/ML suspension TAKE 5 ML BY MOUTH 4 TIMES DAILY 60 mL 0  . OZEMPIC, 0.25 OR 0.5 MG/DOSE, 2 MG/1.5ML SOPN INJECT 0.5 MG INTO THE SKIN ONCE A WEEK 4.5 mL 5  . pantoprazole (PROTONIX) 40 MG tablet Take 1 tablet (40 mg total) by mouth daily. 90 tablet 4  .  rosuvastatin (CRESTOR) 20 MG tablet Take 1 tablet (20 mg total) by mouth daily. 90 tablet 3  . traZODone (DESYREL) 100 MG tablet TAKE 1 TABLET BY MOUTH AT BEDTIME AS NEEDED FOR SLEEP 90 tablet 0  . vitamin B-12 (CYANOCOBALAMIN) 1000 MCG tablet Take 1 tablet (1,000 mcg total) by mouth daily. 90 tablet 2  . benzonatate (TESSALON) 200 MG capsule Take 1 capsule (200 mg total) by mouth 2 (two) times daily as needed for cough. 20 capsule 0  . cilostazol (PLETAL) 100 MG tablet Take 1 tablet (100 mg total) by mouth 2 (two) times daily before a meal. 60 tablet 3  . clobetasol (TEMOVATE) 0.05 % external solution Patient to mix solution in 1 jar of CeraVe Cream. Apply to itchy rash twice daily until improved. 50 mL 1  . nicotine (NICODERM CQ - DOSED IN MG/24 HOURS) 21 mg/24hr patch Place 1 patch (21 mg total) onto the skin daily. 28 patch 0  . predniSONE (DELTASONE) 10 MG tablet Please take 50 mg oral daily x3 days, then decrease to 30 mg oral daily x3 days, then 20 mg oral daily x 3 days, then 10 mg  oral x2 days then stop. 34 tablet 0   No facility-administered medications prior to visit.     Objective:     BP 116/66 (BP Location: Left Arm, Cuff Size: Normal)   Pulse 82   Temp 97.7 F (36.5 C) (Temporal)   Ht 5' 7"  (1.702 m)   Wt 216 lb 9.6 oz (98.2 kg)   SpO2 92% Comment: on RA  BMI 33.92 kg/m   SpO2: 92 % (on RA)    HEENT : pt wearing mask not removed for exam due to covid - 19 concerns.    NECK :  without JVD/Nodes/TM/ nl carotid upstrokes bilaterally   LUNGS: no acc muscle use,  Mild barrel  contour chest wall with bilateral  Distant bs s audible wheeze and  without cough on insp or exp maneuvers  and mild  Hyperresonant  to  percussion bilaterally     CV:  RRR  no s3 or murmur or increase in P2, and no edema   ABD:  Obese soft and nontender with pos end  insp Hoover's  in the supine position. No bruits or organomegaly appreciated, bowel sounds nl  MS:   Nl gait/  ext warm without deformities, calf tenderness, cyanosis or clubbing No obvious joint restrictions   SKIN: warm and dry without lesions    NEURO:  alert, approp, nl sensorium with  no motor or cerebellar deficits apparent.        I personally reviewed images and agree with radiology impression as follows:   Chest HRCT  06/05/20  1. Lung-RADS 2, benign appearance or behavior. Continue annual screening with low-dose chest CT without contrast in 12 months. 2. Hepatic steatosis. 3. Aortic Atherosclerosis (ICD10-I70.0) and Emphysema (ICD10-J43.9).    Assessment   No problem-specific Assessment & Plan notes found for this encounter.     Christinia Gully, MD 09/08/2020

## 2020-09-08 NOTE — Assessment & Plan Note (Signed)
Active smoker - 09/08/2020  After extensive coaching inhaler device,  effectiveness =    75% from a baseline of < 50% so continue breztri -  09/08/2020   Walked RA  2 laps @ approx 248ft each @ mod pace  stopped due to end of study, sob by end of study but sats still 91%   Group D in terms of symptom/risk and laba/lama/ICS  therefore appropriate rx at this point >>>  Continue breztri and approp saba  Re saba I spent extra time with pt today reviewing appropriate use of albuterol for prn use on exertion with the following points: 1) saba is for relief of sob that does not improve by walking a slower pace or resting but rather if the pt does not improve after trying this first. 2) If the pt is convinced, as many are, that saba helps recover from activity faster then it's easy to tell if this is the case by re-challenging : ie stop, take the inhaler, then p 5 minutes try the exact same activity (intensity of workload) that just caused the symptoms and see if they are substantially diminished or not after saba 3) if there is an activity that reproducibly causes the symptoms, try the saba 15 min before the activity on alternate days   If in fact the saba really does help, then fine to continue to use it prn but advised may need to look closer at the maintenance regimen being used to achieve better control of airways disease with exertion.

## 2020-09-08 NOTE — Assessment & Plan Note (Addendum)
Counseled re importance of smoking cessation but did not meet time criteria for separate billing  Each maintenance medication was reviewed in detail including emphasizing most importantly the difference between maintenance and prns and under what circumstances the prns are to be triggered using an action plan format where appropriate.  Total time for H and P, chart review, counseling, reviewing hfa device(s) , directly observing portions of ambulatory 02 saturation study/ and generating customized AVS unique to this office visit / same day charting = 31 min

## 2020-09-08 NOTE — Patient Instructions (Signed)
The key is to stop smoking completely before smoking completely stops you!  Plan A = Automatic = Always=    Breztri Take 2 puffs first thing in am and then another 2 puffs about 12 hours later.    Work on inhaler technique:  relax and gently blow all the way out then take a nice smooth full deep breath back in, triggering the inhaler at same time you start breathing in.  Hold for up to 5 seconds if you can. Blow out thru nose. Rinse and gargle with water when done.  If mouth or throat bother you at all,  try brushing teeth/gums/tongue with arm and hammer toothpaste/ make a slurry and gargle and spit out.      Plan B = Backup (to supplement plan A, not to replace it) Only use your albuterol inhaler as a rescue medication to be used if you can't catch your breath by resting or doing a relaxed purse lip breathing pattern.  - The less you use it, the better it will work when you need it. - Ok to use the inhaler up to 2 puffs  every 4 hours if you must but call for appointment if use goes up over your usual need - Don't leave home without it !!  (think of it like the spare tire for your car)   Plan C = Crisis (instead of Plan B but only if Plan B stops working) - only use your albuterol nebulizer if you first try Plan B and it fails to help > ok to use the nebulizer up to every 4 hours but if start needing it regularly call for immediate appointment    Please schedule a follow up office visit in 6 weeks, call sooner if needed with PFTs on return

## 2020-09-09 ENCOUNTER — Ambulatory Visit: Payer: Medicare Other | Admitting: Nurse Practitioner

## 2020-09-16 DIAGNOSIS — R002 Palpitations: Secondary | ICD-10-CM | POA: Diagnosis not present

## 2020-09-17 ENCOUNTER — Other Ambulatory Visit: Payer: Self-pay

## 2020-09-17 ENCOUNTER — Ambulatory Visit
Admission: RE | Admit: 2020-09-17 | Discharge: 2020-09-17 | Disposition: A | Discharge status: Home / Self Care | Payer: Medicare Other | Source: Ambulatory Visit | Attending: Nurse Practitioner | Admitting: Nurse Practitioner

## 2020-09-17 DIAGNOSIS — J9612 Chronic respiratory failure with hypercapnia: Secondary | ICD-10-CM | POA: Insufficient documentation

## 2020-09-17 DIAGNOSIS — J449 Chronic obstructive pulmonary disease, unspecified: Secondary | ICD-10-CM | POA: Diagnosis not present

## 2020-09-17 DIAGNOSIS — I152 Hypertension secondary to endocrine disorders: Secondary | ICD-10-CM | POA: Diagnosis not present

## 2020-09-17 DIAGNOSIS — J9611 Chronic respiratory failure with hypoxia: Secondary | ICD-10-CM | POA: Insufficient documentation

## 2020-09-17 DIAGNOSIS — I10 Essential (primary) hypertension: Secondary | ICD-10-CM | POA: Diagnosis not present

## 2020-09-17 DIAGNOSIS — R600 Localized edema: Secondary | ICD-10-CM | POA: Diagnosis not present

## 2020-09-17 DIAGNOSIS — E119 Type 2 diabetes mellitus without complications: Secondary | ICD-10-CM | POA: Insufficient documentation

## 2020-09-17 DIAGNOSIS — E1159 Type 2 diabetes mellitus with other circulatory complications: Secondary | ICD-10-CM

## 2020-09-17 LAB — ECHOCARDIOGRAM COMPLETE
Area-P 1/2: 3.16 cm2
S' Lateral: 2.98 cm

## 2020-09-17 NOTE — Progress Notes (Signed)
*  PRELIMINARY RESULTS* Echocardiogram 2D Echocardiogram has been performed.  Sherrie Sport 09/17/2020, 10:25 AM

## 2020-09-17 NOTE — Progress Notes (Signed)
Contacted via MyChart   Good evening Jenalee, your echocardiogram has returned and is overall reassuring.  Heart appears to be pumping well and mitral valve working well.  There is a little dysfunction in relaxation of the heart, but this is mild and we will monitor this.  Continue current medications and follow-up visits with both me and cardiology. Keep being awesome!!  Thank you for allowing me to participate in your care.  I appreciate you. Kindest regards, Dayja Loveridge

## 2020-09-25 ENCOUNTER — Ambulatory Visit: Payer: Medicare Other | Admitting: Nurse Practitioner

## 2020-10-02 ENCOUNTER — Other Ambulatory Visit: Payer: Self-pay

## 2020-10-02 ENCOUNTER — Encounter: Payer: Self-pay | Admitting: Cardiology

## 2020-10-02 ENCOUNTER — Ambulatory Visit: Payer: Medicare Other | Admitting: Cardiology

## 2020-10-02 VITALS — BP 110/70 | HR 80 | Ht 67.0 in | Wt 218.4 lb

## 2020-10-02 DIAGNOSIS — R002 Palpitations: Secondary | ICD-10-CM

## 2020-10-02 DIAGNOSIS — F17200 Nicotine dependence, unspecified, uncomplicated: Secondary | ICD-10-CM

## 2020-10-02 DIAGNOSIS — R600 Localized edema: Secondary | ICD-10-CM

## 2020-10-02 MED ORDER — LOSARTAN POTASSIUM 25 MG PO TABS: 12.5000 mg | ORAL_TABLET | Freq: Every day | ORAL | 5 refills | Status: DC

## 2020-10-02 MED ORDER — VERAPAMIL HCL ER 120 MG PO TBCR: 120.0000 mg | EXTENDED_RELEASE_TABLET | Freq: Every day | ORAL | 5 refills | Status: DC

## 2020-10-02 NOTE — Patient Instructions (Signed)
Medication Instructions:     START taking Verapamil 120 MG once a day   START taking Losartan 12.5 MG once a day.  *If you need a refill on your cardiac medications before your next appointment, please call your pharmacy*   Lab Work: None ordered If you have labs (blood work) drawn today and your tests are completely normal, you will receive your results only by: [place] (if you have MyChart) OR A paper copy in the mail If you have any lab test that is abnormal or we need to change your treatment, we will call you to review the results.   Testing/Procedures: None ordered   Follow-Up: At [facility] you and your health needs are our priority.  As part of our continuing mission to provide you with exceptional heart care, we have created designated Provider Care Teams.  These Care Teams include your primary Cardiologist (physician) and Advanced Practice Providers (APPs -  Physician Assistants and Nurse Practitioners) who all work together to provide you with the care you need, when you need it.  We recommend signing up for the patient portal called "MyChart".  Sign up information is provided on this After Visit Summary.  MyChart is used to connect with patients for Virtual Visits (Telemedicine).  Patients are able to view lab/test results, encounter notes, upcoming appointments, etc.  Non-urgent messages can be sent to your provider as well.   To learn more about what you can do with MyChart, go to [email or web address].    Your next appointment:   3 month(s)  The format for your next appointment:   In Person  Provider:   Kate Sable, MD   Other Instructions

## 2020-10-02 NOTE — Progress Notes (Signed)
Cardiology Office Note:    Date:  10/02/2020   ID:  Stacey Gross, DOB 12/31/1960, MRN [id number]  PCP:  Venita Lick, NP  [facility] Cardiologist:  Kate Sable, MD  [place] Electrophysiologist:  None   Referring MD: Kate Sable, MD   Chief Complaint  Patient presents with   Follow-up    Cardiac testing. Patient shortness of breath and palpitations.  Medications reviewed by the patient verbally.     History of Present Illness:    Stacey Gross is a 60 y.o. female with a hx of diabetes, hyperlipidemia, COPD on 2 L oxygen, current smoker x40+ years who presents for follow-up.    She was last seen due to palpitations, and shortness of breath.  Complained of  heart pounding/palpitations.  Also had edema.  Echo was obtained to evaluate cardiac function, cardiac monitor placed to evaluate any significant arrhythmias.  She still complains of palpitations especially with exertion.  Whenever her heart races, she feels more out of breath than usual.  She does not take losartan as prescribed, may be 2 times a week at best.  Her blood pressures are always well controlled.  She denies having any history of hypertension.   Prior notes Coronary CTA 01/23/2020 calcium score 0, no evidence of CAD. Echo 08/5972, normal systolic function, impaired relaxation, EF 60%.  Past Medical History:  Diagnosis Date   Asthma    Cancer ([place])    stomach   Chronic back pain    COPD (chronic obstructive pulmonary disease) (HCC)    Degenerative joint disease (DJD) of hip    Diabetes mellitus without complication (HCC)    Dyspnea    GERD (gastroesophageal reflux disease)    takes Omeprazole daily   History of bronchitis yrs ago   History of colon polyps    benign   History of staph infection 10 yrs ago   Hyperlipidemia    takes Atorvastatin daily   Hypertension    Peripheral vascular disease (HCC)    Tobacco use    8 cigarettes daily    Weakness    numbness and tingling  in both legs     Past Surgical History:  Procedure Laterality Date   ABDOMINAL HYSTERECTOMY     total   APPENDECTOMY     BACK SURGERY  9-10 yrs ago   BREAST BIOPSY Right    Core- neg   COLONOSCOPY     COLONOSCOPY WITH PROPOFOL N/A 09/20/2017   Procedure: COLONOSCOPY WITH PROPOFOL;  Surgeon: Virgel Manifold, MD;  Location: [facility] ENDOSCOPY;  Service: Endoscopy;  Laterality: N/A;   COLONOSCOPY WITH PROPOFOL N/A 04/24/2018   Procedure: COLONOSCOPY WITH PROPOFOL;  Surgeon: Lucilla Lame, MD;  Location: [facility] ENDOSCOPY;  Service: Endoscopy;  Laterality: N/A;   ESOPHAGOGASTRODUODENOSCOPY (EGD) WITH PROPOFOL N/A 09/20/2017   Procedure: ESOPHAGOGASTRODUODENOSCOPY (EGD) WITH PROPOFOL;  Surgeon: Virgel Manifold, MD;  Location: [facility] ENDOSCOPY;  Service: Endoscopy;  Laterality: N/A;   ESOPHAGOGASTRODUODENOSCOPY (EGD) WITH PROPOFOL N/A 11/20/2018   Procedure: ESOPHAGOGASTRODUODENOSCOPY (EGD) WITH PROPOFOL;  Surgeon: Lucilla Lame, MD;  Location: [facility] ENDOSCOPY;  Service: Endoscopy;  Laterality: N/A;   PERIPHERAL VASCULAR CATHETERIZATION Left 03/17/2016   Procedure: Lower Extremity Angiography;  Surgeon: Algernon Huxley, MD;  Location: [place] CV LAB;  Service: Cardiovascular;  Laterality: Left;   TOTAL SHOULDER ARTHROPLASTY Right 07/24/2019   Procedure: RIGHT TOTAL SHOULDER ARTHROPLASTY;  Surgeon: Lovell Sheehan, MD;  Location: [facility] ORS;  Service: Orthopedics;  Laterality: Right;    Current  Medications: Current Meds  Medication Sig   albuterol (PROVENTIL) (2.5 MG/3ML) 0.083% nebulizer solution USE 1 VIAL (3 ML = 2.5 MG TOTAL) BY NEBULIZATION EVERY 6 HOURS AS NEEDED FORWHEEZING OR SHORTNESS OF BREATH   albuterol (VENTOLIN HFA) 108 (90 Base) MCG/ACT inhaler INHALE 2 PUFFS BY MOUTH INTO THE LUNGS EVERY 6 HOURS AS NEEDED FOR WHEEZING OR SHORTNESS OF BREATH   Blood Glucose Monitoring Suppl (ONE TOUCH ULTRA SYSTEM KIT) W/DEVICE KIT 1 kit by Does not apply route once.   Budeson-Glycopyrrol-Formoterol  (BREZTRI AEROSPHERE) 160-9-4.8 MCG/ACT AERO Inhale 2 puffs into the lungs 2 (two) times daily.   Budeson-Glycopyrrol-Formoterol (BREZTRI AEROSPHERE) 160-9-4.8 MCG/ACT AERO Inhale 2 puffs into the lungs in the morning and at bedtime.   citalopram (CELEXA) 20 MG tablet Take 1 tablet (20 mg total) by mouth daily.   cyclobenzaprine (FLEXERIL) 10 MG tablet TAKE ONE TABLET BY MOUTH AT BEDTIME   furosemide (LASIX) 20 MG tablet Take 20 mg by mouth daily.   glucose blood test strip Use to check blood sugar three times a day.   hydrOXYzine (ATARAX/VISTARIL) 25 MG tablet Take 1 tablet (25 mg total) by mouth as directed. Take 1-2 tablets by mouth every 4-6 hours as needed for itch. MAY MAKE DROWSY   meloxicam (MOBIC) 15 MG tablet Take 1 tablet (15 mg total) by mouth daily.   metFORMIN (GLUCOPHAGE) 500 MG tablet Please take 1000 mg oral twice daily for next 10 days, then decrease to 5:30 100 mg oral twice daily thereafter.   nystatin (MYCOSTATIN) 100000 UNIT/ML suspension TAKE 5 ML BY MOUTH 4 TIMES DAILY   OZEMPIC, 0.25 OR 0.5 MG/DOSE, 2 MG/1.5ML SOPN INJECT 0.5 MG INTO THE SKIN ONCE A WEEK   pantoprazole (PROTONIX) 40 MG tablet Take 1 tablet (40 mg total) by mouth daily.   rosuvastatin (CRESTOR) 20 MG tablet Take 1 tablet (20 mg total) by mouth daily.   traZODone (DESYREL) 100 MG tablet TAKE 1 TABLET BY MOUTH AT BEDTIME AS NEEDED FOR SLEEP   verapamil (CALAN-SR) 120 MG CR tablet Take 1 tablet (120 mg total) by mouth at bedtime.   vitamin B-12 (CYANOCOBALAMIN) 1000 MCG tablet Take 1 tablet (1,000 mcg total) by mouth daily.   [DISCONTINUED] losartan (COZAAR) 50 MG tablet Take 1 tablet (50 mg total) by mouth daily.     Allergies:   Doxycycline hyclate, Acetaminophen, Aspirin, Latex, and Vancomycin   Social History   Socioeconomic History   Marital status: Widowed    Spouse name: Not on file   Number of children: 0   Years of education: 8   Highest education level: Not on file  Occupational History    Occupation: disabled  Tobacco Use   Smoking status: Every Day    Packs/day: 2.00    Years: 48.00    Pack years: 96.00    Types: Cigarettes   Smokeless tobacco: Never  Vaping Use   Vaping Use: Never used  Substance and Sexual Activity   Alcohol use: No   Drug use: No   Sexual activity: Not on file  Other Topics Concern   Not on file  Social History Narrative   Not on file   Social Determinants of Health   Financial Resource Strain: Low Risk    Difficulty of Paying Living Expenses: Not hard at all  Food Insecurity: No Food Insecurity   Worried About Charity fundraiser in the Last Year: Never true   [place] in the Last Year: Never true  Transportation Needs: No Data processing manager (Medical): No   Lack of Transportation (Non-Medical): No  Physical Activity: Inactive   Days of Exercise per Week: 0 days   Minutes of Exercise per Session: 0 min  Stress: No Stress Concern Present   Feeling of Stress : Not at all  Social Connections: Not on file     Family History: The patient's family history includes Hyperlipidemia in her father and mother; Melanoma in her sister. There is no history of Breast cancer.  ROS:   Please see the history of present illness.     All other systems reviewed and are negative.  EKGs/Labs/Other Studies Reviewed:    The following studies were reviewed today:   EKG:  EKG is ordered today.  EKG shows normal sinus rhythm, incomplete right bundle branch block.  Recent Labs: 08/14/2020: ALT 27; BNP 25.0; BUN 12; Creatinine, Ser 0.76; Hemoglobin 12.5; Platelets 262; Potassium 4.0; Sodium 140; TSH 1.030  Recent Lipid Panel    Component Value Date/Time   CHOL 181 05/13/2020 0934   CHOL 194 02/22/2017 0847   TRIG 211 (H) 05/13/2020 0934   TRIG 227 (H) 02/22/2017 0847   HDL 44 05/13/2020 0934   CHOLHDL 4.1 05/13/2020 0934   VLDL 45 (H) 02/22/2017 0847   LDLCALC 101 (H) 05/13/2020 0934    Physical Exam:    VS:   BP 110/70 (BP Location: Left Arm, Patient Position: Sitting, Cuff Size: Large)   Pulse 80   Ht _0  (1.702 m)   Wt 218 lb 6 oz (99.1 kg)   SpO2 95%   BMI 34.20 kg/m     Wt Readings from Last 3 Encounters:  10/02/20 218 lb 6 oz (99.1 kg)  09/08/20 216 lb 9.6 oz (98.2 kg)  08/27/20 221 lb (100.2 kg)     GEN:  Well nourished, well developed in no acute distress HEENT: Normal NECK: No JVD; No carotid bruits LYMPHATICS: No lymphadenopathy CARDIAC: RRR, no murmurs, rubs, gallops RESPIRATORY: Bilateral wheezing noted ABDOMEN: Soft, non-tender, non-distended MUSCULOSKELETAL: Trace edema; No deformity  SKIN: Warm and dry NEUROLOGIC:  Alert and oriented x 3 PSYCHIATRIC:  Normal affect   ASSESSMENT:    1. Palpitations   2. Leg edema   3. Smoking    PLAN:    In order of problems listed above:  Palpitations, heart pounding.  Cardiac monitor with no significant arrhythmias, occasional PACs/PVCs.  Which were rare.  Start verapamil 120 mg daily.  Avoiding beta-blockers due to pulmonary disease.  Reduce losartan to 12.5 mg daily with starting verapamil. Leg edema, likely dependent.  Echo 09/17/2020 showed normal systolic function, EF 60 to 65%, impaired relaxation.  Continue  Leg raise while in seated position.  Lasix okay to continue since it is helping Current smoker, cessation again advised.  Follow-up in 3 months.   Medication Adjustments/Labs and Tests Ordered: Current medicines are reviewed at length with the patient today.  Concerns regarding medicines are outlined above.  Orders Placed This Encounter  Procedures   EKG 12-Lead    Meds ordered this encounter  Medications   losartan (COZAAR) 25 MG tablet    Sig: Take 0.5 tablets (12.5 mg total) by mouth daily.    Dispense:  30 tablet    Refill:  5   verapamil (CALAN-SR) 120 MG CR tablet    Sig: Take 1 tablet (120 mg total) by mouth at bedtime.    Dispense:  30 tablet    Refill:  5  Patient Instructions   Medication Instructions:     START taking Verapamil 120 MG once a day   START taking Losartan 12.5 MG once a day.  *If you need a refill on your cardiac medications before your next appointment, please call your pharmacy*   Lab Work: None ordered If you have labs (blood work) drawn today and your tests are completely normal, you will receive your results only by: [place] (if you have MyChart) OR A paper copy in the mail If you have any lab test that is abnormal or we need to change your treatment, we will call you to review the results.   Testing/Procedures: None ordered   Follow-Up: At [facility], you and your health needs are our priority.  As part of our continuing mission to provide you with exceptional heart care, we have created designated Provider Care Teams.  These Care Teams include your primary Cardiologist (physician) and Advanced Practice Providers (APPs -  Physician Assistants and Nurse Practitioners) who all work together to provide you with the care you need, when you need it.  We recommend signing up for the patient portal called "MyChart".  Sign up information is provided on this After Visit Summary.  MyChart is used to connect with patients for Virtual Visits (Telemedicine).  Patients are able to view lab/test results, encounter notes, upcoming appointments, etc.  Non-urgent messages can be sent to your provider as well.   To learn more about what you can do with MyChart, go to [email or web address].    Your next appointment:   3 month(s)  The format for your next appointment:   In Person  Provider:   Kate Sable, MD   Other Instructions     Signed, Kate Sable, MD  10/02/2020 12:09 PM    [place]

## 2020-10-08 ENCOUNTER — Other Ambulatory Visit: Payer: Self-pay | Admitting: Nurse Practitioner

## 2020-10-23 ENCOUNTER — Other Ambulatory Visit: Payer: Self-pay

## 2020-10-23 ENCOUNTER — Other Ambulatory Visit
Admission: RE | Admit: 2020-10-23 | Discharge: 2020-10-23 | Disposition: A | Discharge status: Home / Self Care | Payer: Medicare Other | Source: Ambulatory Visit | Attending: Internal Medicine | Admitting: Internal Medicine

## 2020-10-23 DIAGNOSIS — Z20822 Contact with and (suspected) exposure to covid-19: Secondary | ICD-10-CM | POA: Diagnosis not present

## 2020-10-23 DIAGNOSIS — Z01812 Encounter for preprocedural laboratory examination: Secondary | ICD-10-CM | POA: Diagnosis not present

## 2020-10-23 LAB — SARS CORONAVIRUS 2 (TAT 6-24 HRS): SARS Coronavirus 2: NEGATIVE

## 2020-10-27 ENCOUNTER — Other Ambulatory Visit: Payer: Self-pay

## 2020-10-27 ENCOUNTER — Ambulatory Visit: Payer: Medicare Other | Admitting: Internal Medicine

## 2020-10-27 ENCOUNTER — Encounter: Payer: Self-pay | Admitting: *Deleted

## 2020-10-27 ENCOUNTER — Ambulatory Visit (INDEPENDENT_AMBULATORY_CARE_PROVIDER_SITE_OTHER): Payer: Medicare Other | Admitting: Internal Medicine

## 2020-10-27 ENCOUNTER — Encounter: Payer: Self-pay | Admitting: Internal Medicine

## 2020-10-27 DIAGNOSIS — F17210 Nicotine dependence, cigarettes, uncomplicated: Secondary | ICD-10-CM | POA: Diagnosis not present

## 2020-10-27 DIAGNOSIS — R0602 Shortness of breath: Secondary | ICD-10-CM

## 2020-10-27 DIAGNOSIS — J449 Chronic obstructive pulmonary disease, unspecified: Secondary | ICD-10-CM | POA: Diagnosis not present

## 2020-10-27 LAB — PULMONARY FUNCTION TEST
DL/VA % pred: 77 %
DL/VA: 3.2 ml/min/mmHg/L
DLCO cor % pred: 64 %
DLCO cor: 14.21 ml/min/mmHg
DLCO unc % pred: 64 %
DLCO unc: 14.21 ml/min/mmHg
FEF 25-75 Post: 0.99 L/sec
FEF 25-75 Pre: 0.75 L/sec
FEF2575-%Change-Post: 32 %
FEF2575-%Pred-Post: 40 %
FEF2575-%Pred-Pre: 30 %
FEV1-%Change-Post: 9 %
FEV1-%Pred-Post: 55 %
FEV1-%Pred-Pre: 50 %
FEV1-Post: 1.56 L
FEV1-Pre: 1.42 L
FEV1FVC-%Change-Post: 0 %
FEV1FVC-%Pred-Pre: 78 %
FEV6-%Change-Post: 10 %
FEV6-%Pred-Post: 71 %
FEV6-%Pred-Pre: 65 %
FEV6-Post: 2.52 L
FEV6-Pre: 2.29 L
FEV6FVC-%Change-Post: 0 %
FEV6FVC-%Pred-Post: 102 %
FEV6FVC-%Pred-Pre: 102 %
FVC-%Change-Post: 9 %
FVC-%Pred-Post: 70 %
FVC-%Pred-Pre: 64 %
FVC-Post: 2.56 L
FVC-Pre: 2.33 L
Post FEV1/FVC ratio: 61 %
Post FEV6/FVC ratio: 98 %
Pre FEV1/FVC ratio: 61 %
Pre FEV6/FVC Ratio: 99 %
RV % pred: 151 %
RV: 3.29 L
TLC % pred: 102 %
TLC: 5.67 L

## 2020-10-27 NOTE — Patient Instructions (Signed)
The key is to stop smoking completely before smoking completely stops you!  Work on inhaler technique:  relax and gently blow all the way out then take a nice smooth full deep breath back in, triggering the inhaler at same time you start breathing in.  Hold for up to 5 seconds if you can. Blow out thru nose. Rinse and gargle with water when done.  If mouth or throat bother you at all,  try brushing teeth/gums/tongue with arm and hammer toothpaste/ make a slurry and gargle and spit out.   Please schedule a follow up visit in 6 months but call sooner if needed  - bring inhalers

## 2020-10-27 NOTE — Assessment & Plan Note (Signed)
4-5 min discussion re active cigarette smoking in addition to office E&M  Ask about tobacco use:   active Advise quitting   I took an extended  opportunity with this patient to outline the consequences of continued cigarette use  in airway disorders based on all the data we have from the multiple national lung health studies (perfomed over decades at millions of dollars in cost)  indicating that smoking cessation, not choice of inhalers or physicians, is the most important aspect of her care.   Assess willingness:  Not committed at this point Assist in quit attempt:  Per PCP when ready Arrange follow up:   Follow up per Primary Care planned

## 2020-10-27 NOTE — Progress Notes (Signed)
 Stacey Gross, female    DOB: 08/25/1960    MRN: [id number]   Brief patient profile:  60 yowf active smoker from [place] arrived in [place] age 60 never IUP and new onset breathing problems age 38 rx albuterol then various others only neb helped avg use twice daily even using multiple maint including breztri then admitted:    Admit date: 07/25/2020 Discharge date: 07/28/2020   Home Health:NO Equipment/Devices:oxygen 2 L     Acute respiratory failure with hypoxia  due to COPD exacerbation:  -She is with no oxygen requirement at baseline, she will be discharged on home oxygen.   -Presents with no evidence of pneumonia on chest x-ray, but she reports significant cough, productive with phlegm, she was treated with IV steroids, IV Rocephin and azithromycin, with significant wheezing, remains on IV steroids during hospital stay, this has improved, she will be discharged on prolonged prednisone taper, already has nebulizer machine at home, home oxygen has been arranged on discharge, she was encouraged use incentive spirometry and flutter valve.  - Sepsis ruled out   Hyperlipidemia associated with type 2 diabetes mellitus (HCC) -Crestor   Hypertension associated with diabetes (HCC) -blood pressure remains elevated on home Cozaar, so she was started on 10 mg of Norvasc.   GERD (gastroesophageal reflux disease) -Protonix   Depression -Continue home medication, Celexa   Type 2 diabetes, controlled, with peripheral circulatory disorder (HCC):  - Recent A1c 6.6, well controlled.  Patient taking Ozempic and metformin -Her CBG is currently uncontrolled due to steroids, required Lantus during hospital stay, she was instructed to increase her metformin for next week till she is off steroids.     Tobacco abuse -Nicotine patch, she is counseled and reports she is determined to quit smoking.   PVD (peripheral vascular disease) (HCC) -Cilostazol     Discharge Diagnoses:  COPD exacerbation   Hyperlipidemia associated with type 2 diabetes mellitus (HCC)  Hypertension associated with diabetes (HCC)  GERD (gastroesophageal reflux disease)  Depression  Type 2 diabetes, controlled, with peripheral circulatory disorder (HCC)   Acute respiratory failure with hypoxia (HCC)   Tobacco abuse   PVD (peripheral vascular disease) (HCC)   Sepsis (HCC)        History of Present Illness  09/08/2020  Pulmonary/ 1st office eval/Wert doe/ still smoking/ refuses more 02  Chief Complaint  Patient presents with   Pulmonary Consult    Referred by Jolene Cannady, NP. Pt c/o DOE for the past 20 years. States that she gets winded walking 5 ft. She states had PNA end of April 2022.   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 Plan A = Automatic = Always=    Breztri Take 2 puffs first thing in am and then another 2 puffs about 12 hours later.  Work on inhaler technique:   Plan B = Backup (to supplement plan A, not to replace it) Only use your albuterol inhaler as a rescue medication  Plan C = Crisis (instead of Plan B but only if Plan B stops working) - only use your albuterol nebulizer if you first try Plan B and it fails to help > ok to use the nebulizer   up to every 4 hours but if start needing it regularly call for immediate appointment    10/27/2020  f/u ov/Sol Englert re: copd GOLD II/ still smoking No chief complaint on file.  Dyspnea:  [employer] x sev aisles  Cough: am congestion x  up to sev hours mucoid  Sleeping: bed is flat with sev pillows  SABA use: sev times a daily  02: none  Covid status:   never vaccinated - never sick    No obvious day to day or daytime variability or assoc  purulent sputum or mucus plugs or hemoptysis or cp or  chest tightness, subjective wheeze or overt sinus or hb symptoms.   Sleeping as above without nocturnal    exacerbation  of respiratory  c/o's or need for noct saba. Also denies any obvious fluctuation of symptoms with weather or environmental changes or other aggravating or alleviating factors except as outlined above   No unusual exposure hx or h/o childhood pna/ asthma or knowledge of premature birth.  Current Allergies, Complete Past Medical History, Past Surgical History, Family History, and Social History were reviewed in [employer] record.  ROS  The following are not active complaints unless bolded Hoarseness, sore throat, dysphagia, dental problems, itching, sneezing,  nasal congestion or discharge of excess mucus or purulent secretions, ear ache,   fever, chills, sweats, unintended wt loss or wt gain, classically pleuritic or exertional cp,  orthopnea pnd or arm/hand swelling  or leg swelling, presyncope, palpitations, abdominal pain, anorexia, nausea, vomiting, diarrhea  or change in bowel habits or change in bladder habits, change in stools or change in urine, dysuria, hematuria,  rash, arthralgias, visual complaints, headache, numbness, weakness or ataxia or problems with walking or coordination,  change in mood or  memory.        Current Meds  Medication Sig   albuterol (PROVENTIL) (2.5 MG/3ML) 0.083% nebulizer solution USE 1 VIAL (3 ML = 2.5 MG TOTAL) BY NEBULIZATION EVERY 6 HOURS AS NEEDED FORWHEEZING OR SHORTNESS OF BREATH   albuterol (VENTOLIN HFA) 108 (90 Base) MCG/ACT inhaler INHALE 2 PUFFS BY MOUTH INTO THE LUNGS EVERY 6 HOURS AS NEEDED FOR WHEEZING OR SHORTNESS OF BREATH   Blood Glucose Monitoring Suppl (ONE TOUCH ULTRA SYSTEM KIT) W/DEVICE KIT 1 kit by Does not apply route once.   Budeson-Glycopyrrol-Formoterol (BREZTRI AEROSPHERE) 160-9-4.8 MCG/ACT AERO Inhale 2 puffs into the lungs 2 (two) times daily.   Budeson-Glycopyrrol-Formoterol (BREZTRI  AEROSPHERE) 160-9-4.8 MCG/ACT AERO Inhale 2 puffs into the lungs in the morning and at bedtime.   citalopram (CELEXA) 20 MG tablet Take 1 tablet (20 mg total) by mouth daily.   cyclobenzaprine (FLEXERIL) 10 MG tablet TAKE ONE TABLET BY MOUTH AT BEDTIME   furosemide (LASIX) 20 MG tablet Take 20 mg by mouth daily.   glucose blood test strip Use to check blood sugar three times a day.   hydrOXYzine (ATARAX/VISTARIL) 25 MG tablet Take 1 tablet (25 mg total) by mouth as directed. Take 1-2 tablets by mouth every 4-6 hours as needed for itch. MAY MAKE DROWSY   losartan (COZAAR) 25 MG tablet Take 0.5 tablets (12.5 mg total) by mouth daily.   meloxicam (MOBIC) 15 MG tablet Take 1 tablet (15 mg total) by mouth daily.   metFORMIN (GLUCOPHAGE) 500 MG tablet Please take 1000 mg oral twice daily for next 10 days, then decrease to 5:30 100 mg oral twice daily thereafter.   nystatin (MYCOSTATIN) 100000 UNIT/ML suspension TAKE 5 ML BY MOUTH 4  TIMES DAILY   OZEMPIC, 0.25 OR 0.5 MG/DOSE, 2 MG/1.5ML SOPN INJECT 0.5 MG INTO THE SKIN ONCE A WEEK   pantoprazole (PROTONIX) 40 MG tablet Take 1 tablet (40 mg total) by mouth daily.   rosuvastatin (CRESTOR) 20 MG tablet Take 1 tablet (20 mg total) by mouth daily.   traZODone (DESYREL) 100 MG tablet TAKE 1 TABLET BY MOUTH AT BEDTIME AS NEEDED FOR SLEEP   verapamil (CALAN-SR) 120 MG CR tablet Take 1 tablet (120 mg total) by mouth at bedtime.   vitamin B-12 (CYANOCOBALAMIN) 1000 MCG tablet Take 1 tablet (1,000 mcg total) by mouth daily.                   Past Medical History:  Diagnosis Date   Asthma    Cancer ([place])    stomach   Chronic back pain    COPD (chronic obstructive pulmonary disease) (HCC)    Degenerative joint disease (DJD) of hip    Diabetes mellitus without complication (HCC)    Dyspnea    GERD (gastroesophageal reflux disease)    takes Omeprazole daily   History of bronchitis yrs ago   History of colon polyps    benign   History of staph  infection 10 yrs ago   Hyperlipidemia    takes Atorvastatin daily   Hypertension    Peripheral vascular disease (HCC)    Tobacco use    8 cigarettes daily    Weakness    numbness and tingling in both legs        Objective:       Wt Readings from Last 3 Encounters:  10/27/20 219 lb (99.3 kg)  10/02/20 218 lb 6 oz (99.1 kg)  09/08/20 216 lb 9.6 oz (98.2 kg)      Vital signs reviewed  10/27/2020  - Note at rest 02 sats  93% on RA   General appearance:    mod amb obese wf congested cough       HEENT : pt wearing mask not removed for exam due to covid - 19 concerns.    NECK :  without JVD/Nodes/TM/ nl carotid upstrokes bilaterally   LUNGS: no acc muscle use,  Mild barrel  contour chest wall with bilateral  Distant bs s audible wheeze and  without cough on insp or exp maneuvers  and mild  Hyperresonant  to  percussion bilaterally     CV:  RRR  no s3 or murmur or increase in P2, and no edema   ABD:  soft and nontender with pos end  insp Hoover's  in the supine position. No bruits or organomegaly appreciated, bowel sounds nl  MS:   Nl gait/  ext warm without deformities, calf tenderness, cyanosis or clubbing No obvious joint restrictions   SKIN: warm and dry without lesions    NEURO:  alert, approp, nl sensorium with  no motor or cerebellar deficits apparent.          Assessment

## 2020-10-27 NOTE — Assessment & Plan Note (Addendum)
Active smoker - 09/08/2020  After extensive coaching inhaler device,  effectiveness =    75% from a baseline of < 50% so continue breztri -  09/08/2020   Walked RA  2 laps @ approx 289f each @ mod pace  stopped due to end of study, sob by end of study but sats still 91% - PFT's  10/27/2020  FEV1 1.56 (55 % ) ratio 0.61  p 9 % improvement from saba p Breztri prior to study with DLCO  14.21 (64%) corrects to 3.20 (76%)  for alv volume and FV curve min/mild concavity  and ERV 40% AT wt 219     Group D in terms of symptom/risk and laba/lama/ICS  therefore appropriate rx at this point >>>  Continue breztri and prn saba  Re saba: I spent extra time with pt today reviewing appropriate use of albuterol for prn use on exertion with the following points: 1) saba is for relief of sob that does not improve by walking a slower pace or resting but rather if the pt does not improve after trying this first. 2) If the pt is convinced, as many are, that saba helps recover from activity faster then it's easy to tell if this is the case by re-challenging : ie stop, take the inhaler, then p 5 minutes try the exact same activity (intensity of workload) that just caused the symptoms and see if they are substantially diminished or not after saba 3) if there is an activity that reproducibly causes the symptoms, try the saba 15 min before the activity on alternate days   If in fact the saba really does help, then fine to continue to use it prn but advised may need to look closer at the maintenance regimen being used to achieve better control of airways disease with exertion.          Each maintenance medication was reviewed in detail including emphasizing most importantly the difference between maintenance and prns and under what circumstances the prns are to be triggered using an action plan format where appropriate.  Total time for H and P, chart review, counseling, reviewing hfa  device(s) and generating customized AVS  unique to this office visit / same day charting = 263m

## 2020-10-27 NOTE — Progress Notes (Signed)
PFT done today. 

## 2020-11-09 ENCOUNTER — Other Ambulatory Visit: Payer: Self-pay | Admitting: Nurse Practitioner

## 2020-11-10 DIAGNOSIS — M1712 Unilateral primary osteoarthritis, left knee: Secondary | ICD-10-CM | POA: Diagnosis not present

## 2020-11-10 NOTE — Telephone Encounter (Signed)
Patient last seen 08/26/20

## 2020-12-05 IMAGING — CT CT CHEST LUNG CANCER SCREENING LOW DOSE W/O CM
2 of 5 series · 15 of 40 positions shown, 18 images · non-contrast
Comparison: Chest radiograph 02/01/2019. Prior CT of 05/30/2018.

CLINICAL DATA: Ninety-seven pack-year smoking history. Current
smoker.

EXAM:
CT CHEST WITHOUT CONTRAST LOW-DOSE FOR LUNG CANCER SCREENING
TECHNIQUE: Multidetector CT imaging of the chest was performed following the
standard protocol without IV contrast.

[Series 3: lung 1.00 · axial · 0.73mm/px · z∈[-1596,-1287]mm · 12 of 343 slices shown, 15 images]
[im 17/343  mediastinal]
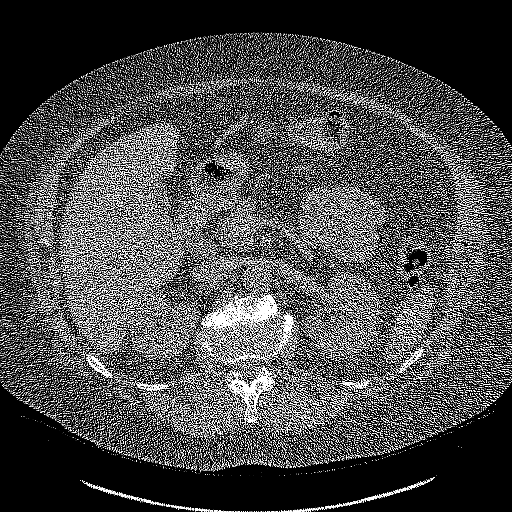
[im 17/343  lung]
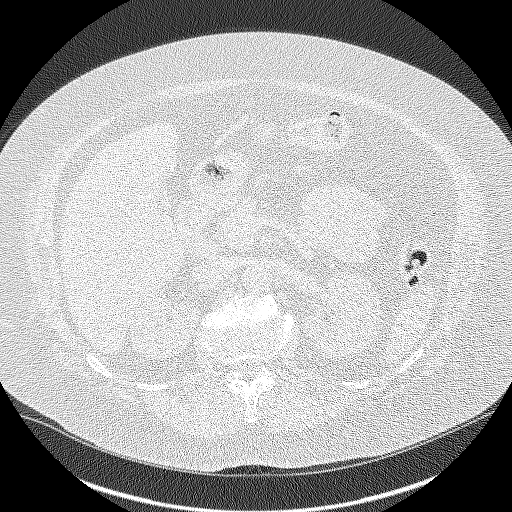
[im 49/343  lung]
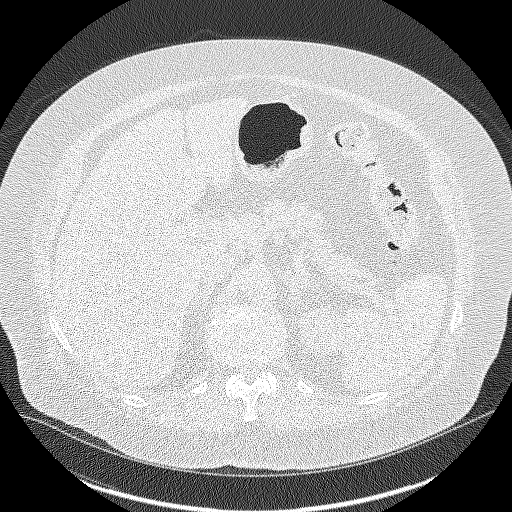
[im 82/343  lung]
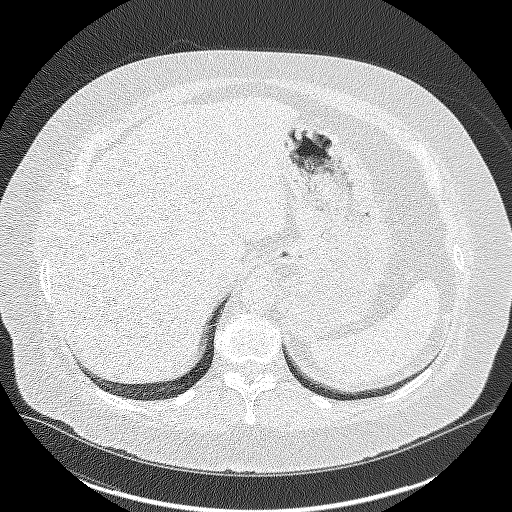
[im 98/343  lung]
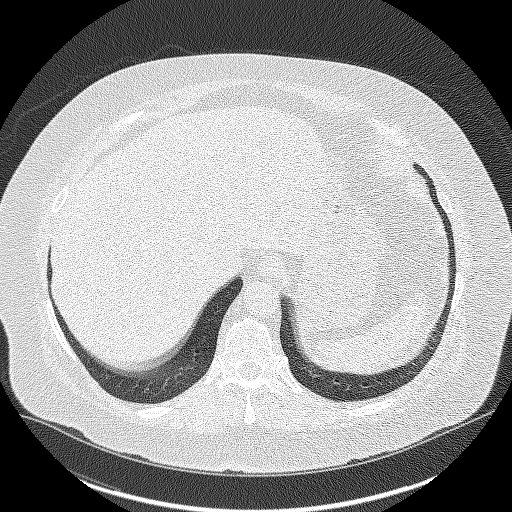
[im 131/343  mediastinal]
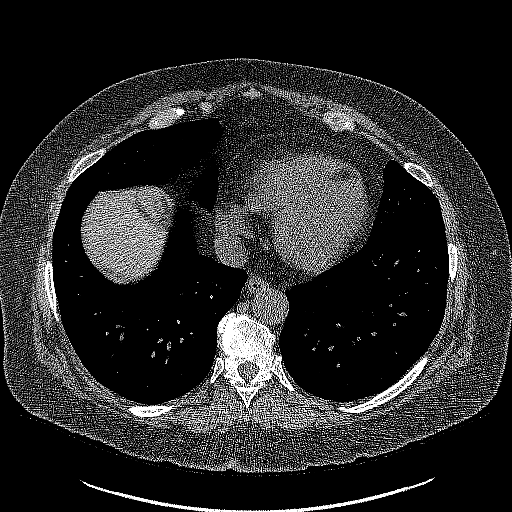
[im 131/343  lung]
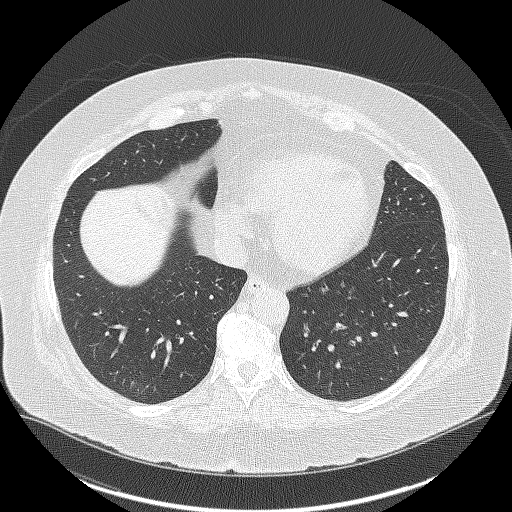
[im 163/343  lung]
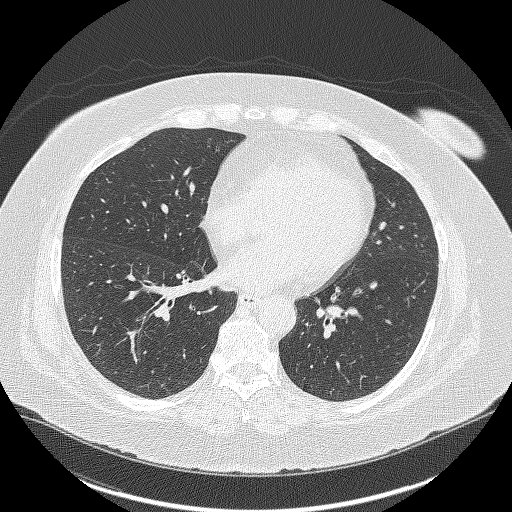
[im 180/343  lung]
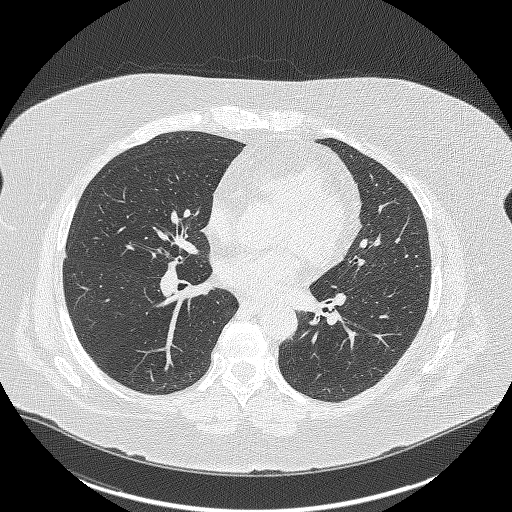
[im 212/343  lung]
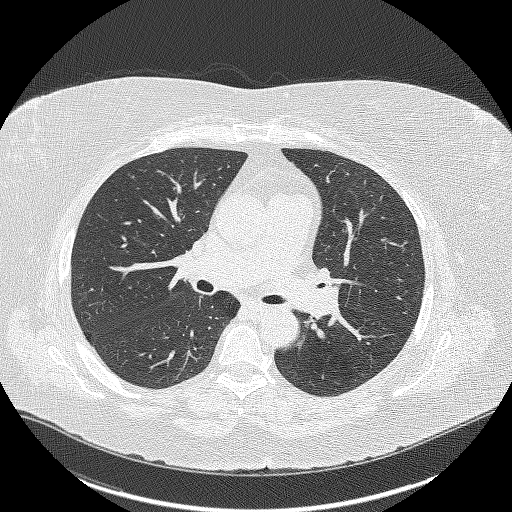
[im 245/343  mediastinal]
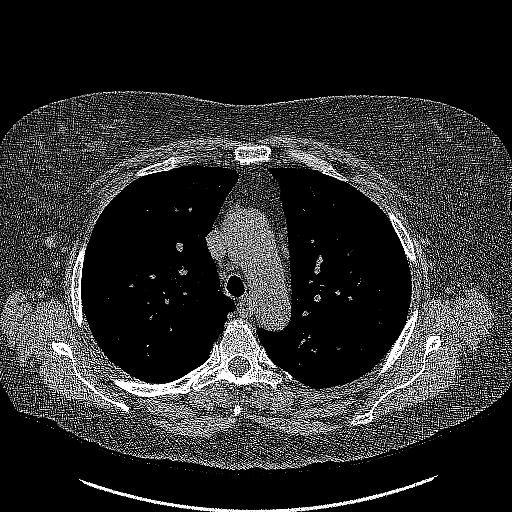
[im 245/343  lung]
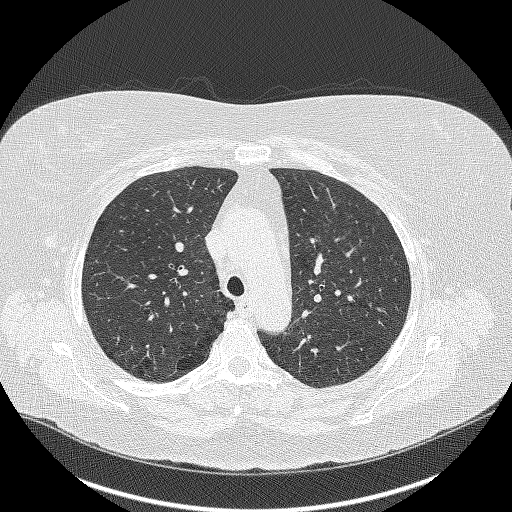
[im 261/343  lung]
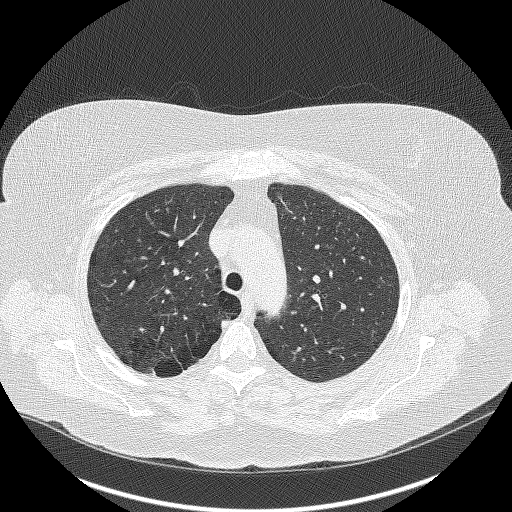
[im 294/343  lung]
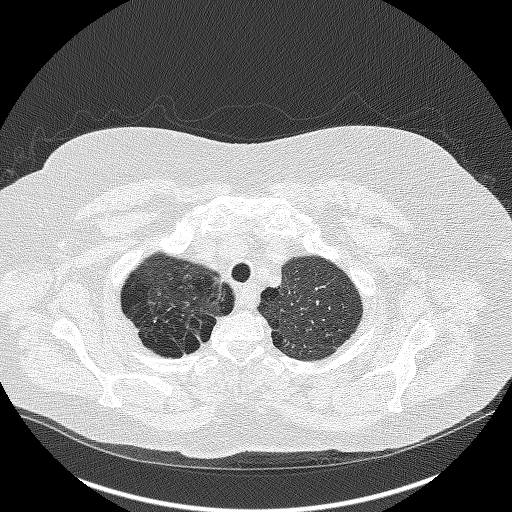
[im 326/343  lung]
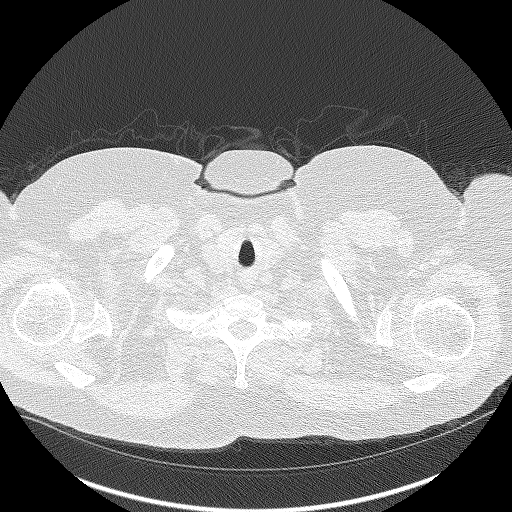

[Series 4: coronals lung 1.00 cor · coronal · 0.67mm/px · 3 of 321 slices shown]
[im 65/321  lung]
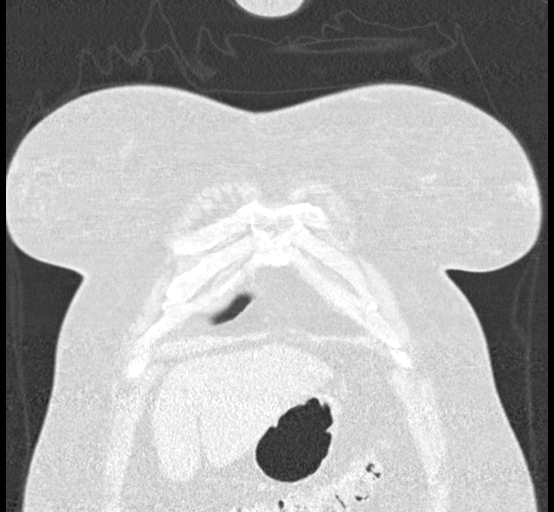
[im 129/321  lung]
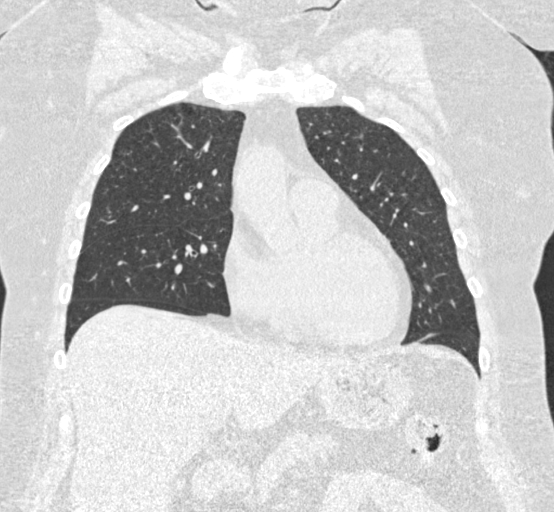
[im 193/321  lung]
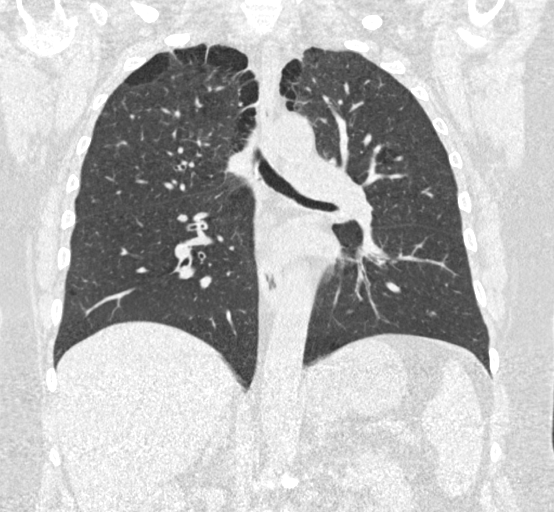

[15 of 40 positions shown; findings below may reference images not displayed]

FINDINGS: Cardiovascular: Aortic atherosclerosis. Normal heart size, without
pericardial effusion. Lipomatous hypertrophy of the interatrial
septum.

Mediastinum/Nodes: No mediastinal or definite hilar adenopathy,
given limitations of unenhanced CT.

Lungs/Pleura: No pleural fluid. Moderate centrilobular and
paraseptal emphysema. Non solid pulmonary nodules in the left upper
lobe measure volume derived equivalent diameter 5.0 Mm and less.
These are unchanged.

Upper Abdomen: Mild hepatic steatosis. Normal imaged portions of the
spleen, stomach, pancreas, gallbladder, adrenal glands, kidneys.

Musculoskeletal: No acute osseous abnormality.
IMPRESSION: 1. Lung-RADS Category 2, benign appearance or behavior. Continue
annual screening with low-dose chest CT without contrast in 12
months.
2. Hepatic steatosis.
3. Aortic Atherosclerosis (LSFGD-8VA.A).
4.  Emphysema (LSFGD-FC3.X).

## 2020-12-11 ENCOUNTER — Other Ambulatory Visit: Payer: Self-pay | Admitting: Nurse Practitioner

## 2020-12-11 NOTE — Telephone Encounter (Signed)
Requested medication (s) are due for refill today - yes  Requested medication (s) are on the active medication list -yes  Future visit scheduled -yes  Last refill: 01/07/20 10.7 3RF, 09/08/20-sample  Notes to clinic: Request RF: medication not assigned protocol   Requested Prescriptions  Pending Prescriptions Disp Refills   BREZTRI AEROSPHERE 160-9-4.8 MCG/ACT AERO [Pharmacy Med Name: BREZTRI AEROSPHERE 160-9-4.8 MCG/AC] 10.7 g 3    Sig: INHALE 2 PUFFS INTO THE LUNGS 2 TIMES A DAY.     Off-Protocol Failed - 12/11/2020 10:10 AM      Failed - Medication not assigned to a protocol, review manually.      Passed - Valid encounter within last 12 months    Recent Outpatient Visits           3 months ago Centrilobular emphysema ([place])   [place], [place] T, NP   3 months ago COPD exacerbation ([place])   [place], [place] T, NP   4 months ago COPD exacerbation ([place])   [place], Jolene T, NP   7 months ago Type 2 diabetes, controlled, with peripheral circulatory disorder ([place])   [place] Cannady, Jolene T, NP   11 months ago Hypertension associated with diabetes ([place])   [place], Barbaraann Faster, NP       Future Appointments             In 3 weeks Agbor-Etang, Aaron Edelman, MD [place], [facility]   In 5 months  [facility], [place]               Requested Prescriptions  Pending Prescriptions Disp Refills   BREZTRI AEROSPHERE 160-9-4.8 MCG/ACT AERO [Pharmacy Med Name: BREZTRI AEROSPHERE 160-9-4.8 MCG/AC] 10.7 g 3    Sig: INHALE 2 PUFFS INTO THE LUNGS 2 TIMES A DAY.     Off-Protocol Failed - 12/11/2020 10:10 AM      Failed - Medication not assigned to a protocol, review manually.      Passed - Valid encounter within last 12 months    Recent Outpatient Visits           3 months ago Centrilobular emphysema ([place])   [place], [place]  T, NP   3 months ago COPD exacerbation ([place])   [place] Sh[place]ley, Idalou T, NP   4 months ago COPD exacerbation ([place])   [place], Jolene T, NP   7 months ago Type 2 diabetes, controlled, with peripheral circulatory disorder ([place])   [place] Cannady, Jolene T, NP   11 months ago Hypertension associated with diabetes ([place])   [place], Jolene T, NP       Future Appointments             In 3 weeks Agbor-Etang, Aaron Edelman, MD [facility], [facility]   In 5 months  [facility], [place]

## 2021-01-01 ENCOUNTER — Ambulatory Visit: Payer: Medicare Other | Admitting: Cardiology

## 2021-01-15 ENCOUNTER — Other Ambulatory Visit: Payer: Self-pay | Admitting: Nurse Practitioner

## 2021-01-15 NOTE — Telephone Encounter (Signed)
  Requested medications are due for refill today Trazodone too early  Requested medications are on the active medication list yes  Last refill Trazodone filled 90 12/11/20  Last visit 08/26/20  Future visit scheduled 05/17/21  Notes to clinic please assess.  Requested Prescriptions  Pending Prescriptions Disp Refills   traZODone (DESYREL) 100 MG tablet [Pharmacy Med Name: TRAZODONE HCL 100 MG TAB] 90 tablet 0    Sig: TAKE 1 TABLET BY MOUTH AT BEDTIME AS NEEDED FOR SLEEP     Psychiatry: Antidepressants - Serotonin Modulator Passed - 01/15/2021 10:18 AM      Passed - Completed PHQ-2 or PHQ-9 in the last 360 days      Passed - Valid encounter within last 6 months    Recent Outpatient Visits           4 months ago Centrilobular emphysema ([place])   [place], Jolene T, NP   5 months ago COPD exacerbation ([place])   [place], [place] T, NP   5 months ago COPD exacerbation ([place])   [place], Jolene T, NP   8 months ago Type 2 diabetes, controlled, with peripheral circulatory disorder ([place])   [place], Jolene T, NP   1 year ago Hypertension associated with diabetes ([place])   [place], Niles T, NP       Future Appointments             In 4 months [place], [facility]             Signed Prescriptions Disp Refills   meloxicam (MOBIC) 15 MG tablet 30 tablet 3    Sig: TAKE 1 TABLET BY MOUTH DAILY     Analgesics:  COX2 Inhibitors Passed - 01/15/2021 10:18 AM      Passed - HGB in normal range and within 360 days    Hemoglobin  Date Value Ref Range Status  08/14/2020 12.5 11.1 - 15.9 g/dL Final          Passed - Cr in normal range and within 360 days    Creat  Date Value Ref Range Status  07/02/2013 0.79 0.50 - 1.10 mg/dL Final   Creatinine, Ser  Date Value Ref Range Status  08/14/2020 0.76 0.57 - 1.00 mg/dL Final          Passed - Patient is not  pregnant      Passed - Valid encounter within last 12 months    Recent Outpatient Visits           4 months ago Centrilobular emphysema ([place])   [place], Jolene T, NP   5 months ago COPD exacerbation ([place])   [place], [place] T, NP   5 months ago COPD exacerbation ([place])   [place], Jolene T, NP   8 months ago Type 2 diabetes, controlled, with peripheral circulatory disorder ([place])   [place] Cannady, Jolene T, NP   1 year ago Hypertension associated with diabetes ([place])   [place], Barbaraann Faster, NP       Future Appointments             In 4 months [place], [facility]

## 2021-01-18 ENCOUNTER — Other Ambulatory Visit: Payer: Self-pay | Admitting: Nurse Practitioner

## 2021-01-18 NOTE — Telephone Encounter (Signed)
Patient is scheduled for an appointment on 01/21/2021.

## 2021-01-18 NOTE — Telephone Encounter (Signed)
Duplicate- filled by provider today Requested Prescriptions  Pending Prescriptions Disp Refills  . traZODone (DESYREL) 100 MG tablet [Pharmacy Med Name: TRAZODONE HCL 100 MG TAB] 90 tablet 0    Sig: TAKE 1 TABLET BY MOUTH AT BEDTIME AS NEEDED FOR SLEEP     Psychiatry: Antidepressants - Serotonin Modulator Passed - 01/18/2021 10:29 AM      Passed - Completed PHQ-2 or PHQ-9 in the last 360 days      Passed - Valid encounter within last 6 months    Recent Outpatient Visits          4 months ago Centrilobular emphysema ([place])   [place] Cannady, Jolene T, NP   5 months ago COPD exacerbation ([place])   [place], Max T, NP   6 months ago COPD exacerbation ([place])   [place], Jolene T, NP   8 months ago Type 2 diabetes, controlled, with peripheral circulatory disorder ([place])   [place] Cannady, Jolene T, NP   1 year ago Hypertension associated with diabetes ([place])   [place], Barbaraann Faster, NP      Future Appointments            In 3 days Cannady, Barbaraann Faster, NP [facility], [facility]   In 3 months  [facility], [facility]

## 2021-01-21 ENCOUNTER — Ambulatory Visit: Payer: Medicare Other | Admitting: Nurse Practitioner

## 2021-01-28 ENCOUNTER — Encounter: Payer: Self-pay | Admitting: Nurse Practitioner

## 2021-01-28 ENCOUNTER — Other Ambulatory Visit: Payer: Self-pay

## 2021-01-28 ENCOUNTER — Ambulatory Visit (INDEPENDENT_AMBULATORY_CARE_PROVIDER_SITE_OTHER): Payer: Medicare Other | Admitting: Nurse Practitioner

## 2021-01-28 VITALS — BP 108/75 | HR 67 | Temp 98.1°F | Wt 217.8 lb

## 2021-01-28 DIAGNOSIS — R051 Acute cough: Secondary | ICD-10-CM

## 2021-01-28 DIAGNOSIS — J441 Chronic obstructive pulmonary disease with (acute) exacerbation: Secondary | ICD-10-CM

## 2021-01-28 LAB — VERITOR FLU A/B WAIVED
Influenza A: NEGATIVE
Influenza B: NEGATIVE

## 2021-01-28 MED ORDER — METHYLPREDNISOLONE SODIUM SUCC 40 MG IJ SOLR
40.0000 mg | Freq: Once | INTRAMUSCULAR | Status: AC
  Administered 2021-01-28: 40 mg via INTRAMUSCULAR

## 2021-01-28 MED ORDER — AMOXICILLIN-POT CLAVULANATE 875-125 MG PO TABS: 1.0000 | ORAL_TABLET | Freq: Two times a day (BID) | ORAL | 0 refills | Status: DC

## 2021-01-28 MED ORDER — PREDNISONE 10 MG PO TABS: ORAL_TABLET | ORAL | 0 refills | Status: DC

## 2021-01-28 MED ORDER — TRIAMCINOLONE ACETONIDE 40 MG/ML IJ SUSP: 40.0000 mg | Freq: Once | INTRAMUSCULAR | Status: DC

## 2021-01-28 NOTE — Assessment & Plan Note (Signed)
Acute x1 week. Oxygen levels 90% on room air when arrived, then 92% on recheck. She gets extremely short of breath with minimal exertion. She is refusing to go to the Emergency Room today. She does not have oxygen at home. She last used her albuterol nebulizer around 9am. Will give solumedrol 40mg  IM x1 in office today. Start prednisone taper tomorrow. Start augmentin today. Discussed smoking cessation. She can take mucinex OTC and perform deep breathing exercises at home. Discussed strict ER precautions. She will monitor her oxygen saturation levels at home. Follow up Monday or Tuesday next week.

## 2021-01-28 NOTE — Progress Notes (Signed)
Acute Office Visit  Subjective:    Patient ID: Stacey Gross, female    DOB: 05/20/1960, 60 y.o.   MRN: [id number]  Chief Complaint  Patient presents with   URI    Pt states she has had a cough, SOB, wheezing, and congestion for the past week. States symptoms started a week ago today.     HPI Patient is in today for cough, wheezing, and shortness of breath for 1 week.   UPPER RESPIRATORY TRACT INFECTION  Worst symptom: shortness of breath, cough Fever: no Cough: yes - yellow sputum Shortness of breath: yes Wheezing: yes Chest pain: yes, with cough Chest tightness: yes Chest congestion: yes Nasal congestion: no Runny nose: yes Post nasal drip: yes Sneezing: no Sore throat: no Swollen glands: no Sinus pressure: yes Headache: yes Face pain: no Toothache: no Ear pain: yes left Ear pressure: no  Eyes red/itching:no Eye drainage/crusting: no  Vomiting: no Rash: no Fatigue: yes Sick contacts: yes - boyfriend (was negative for covid-19) Strep contacts: no  Context: worse Recurrent sinusitis: no Relief with OTC cold/cough medications: no  Treatments attempted: alkaseltzer    Past Medical History:  Diagnosis Date   Asthma    Cancer ([place])    stomach   Chronic back pain    COPD (chronic obstructive pulmonary disease) (HCC)    Degenerative joint disease (DJD) of hip    Diabetes mellitus without complication (HCC)    Dyspnea    GERD (gastroesophageal reflux disease)    takes Omeprazole daily   History of bronchitis yrs ago   History of colon polyps    benign   History of staph infection 10 yrs ago   Hyperlipidemia    takes Atorvastatin daily   Hypertension    Peripheral vascular disease (HCC)    Tobacco use    8 cigarettes daily    Weakness    numbness and tingling in both legs     Past Surgical History:  Procedure Laterality Date   APPENDECTOMY     BACK SURGERY  9-10 yrs ago   BREAST BIOPSY Right    Core- neg   COLONOSCOPY     COLONOSCOPY WITH  PROPOFOL N/A 09/20/2017   Procedure: COLONOSCOPY WITH PROPOFOL;  Surgeon: Virgel Manifold, MD;  Location: [facility] ENDOSCOPY;  Service: Endoscopy;  Laterality: N/A;   COLONOSCOPY WITH PROPOFOL N/A 04/24/2018   Procedure: COLONOSCOPY WITH PROPOFOL;  Surgeon: Lucilla Lame, MD;  Location[facility] ENDOSCOPY;  Service: Endoscopy;  Laterality: N/A;   ESOPHAGOGASTRODUODENOSCOPY (EGD) WITH PROPOFOL N/A 09/20/2017   Procedure: ESOPHAGOGASTRODUODENOSCOPY (EGD) WITH PROPOFOL;  Surgeon: Virgel Manifold, MD;  Location: [facility] ENDOSCOPY;  Service: Endoscopy;  Laterality: N/A;   ESOPHAGOGASTRODUODENOSCOPY (EGD) WITH PROPOFOL N/A 11/20/2018   Procedure: ESOPHAGOGASTRODUODENOSCOPY (EGD) WITH PROPOFOL;  Surgeon: Lucilla Lame, MD;  Location: [facility] ENDOSCOPY;  Service: Endoscopy;  Laterality: N/A;   PERIPHERAL VASCULAR CATHETERIZATION Left 03/17/2016   Procedure: Lower Extremity Angiography;  Surgeon: Algernon Huxley, MD;  Location: [facility] LAB;  Service: Cardiovascular;  Laterality: Left;   TOTAL ABDOMINAL HYSTERECTOMY W/ BILATERAL SALPINGOOPHORECTOMY     total   TOTAL SHOULDER ARTHROPLASTY Right 07/24/2019   Procedure: RIGHT TOTAL SHOULDER ARTHROPLASTY;  Surgeon: Lovell Sheehan, MD;  Location: [facility] ORS;  Service: Orthopedics;  Laterality: Right;    Family History  Problem Relation Age of Onset   Hyperlipidemia Mother    Hyperlipidemia Father    Melanoma Sister    Breast cancer Neg Hx     Social  History   Socioeconomic History   Marital status: Widowed    Spouse name: Not on file   Number of children: 0   Years of education: 8   Highest education level: Not on file  Occupational History   Occupation: disabled  Tobacco Use   Smoking status: Every Day    Packs/day: 1.50    Years: 48.00    Pack years: 72.00    Types: Cigarettes   Smokeless tobacco: Never  Vaping Use   Vaping Use: Never used  Substance and Sexual Activity   Alcohol use: No   Drug use: No   Sexual activity: Not on file   Other Topics Concern   Not on file  Social History Narrative   Not on file   Social Determinants of Health   Financial Resource Strain: Low Risk    Difficulty of Paying Living Expenses: Not hard at all  Food Insecurity: No Food Insecurity   Worried About Charity fundraiser in the Last Year: Never true   [place] in the Last Year: Never true  Transportation Needs: No Transportation Needs   Lack of Transportation (Medical): No   Lack of Transportation (Non-Medical): No  Physical Activity: Inactive   Days of Exercise per Week: 0 days   Minutes of Exercise per Session: 0 min  Stress: No Stress Concern Present   Feeling of Stress : Not at all  Social Connections: Not on file  Intimate Partner Violence: Not on file    Outpatient Medications Prior to Visit  Medication Sig Dispense Refill   albuterol (PROVENTIL) (2.5 MG/3ML) 0.083% nebulizer solution USE 1 VIAL (3 ML = 2.5 MG TOTAL) BY NEBULIZATION EVERY 6 HOURS AS NEEDED FORWHEEZING OR SHORTNESS OF BREATH 180 mL 4   albuterol (VENTOLIN HFA) 108 (90 Base) MCG/ACT inhaler INHALE 2 PUFFS BY MOUTH INTO THE LUNGS EVERY 6 HOURS AS NEEDED FOR WHEEZING OR SHORTNESS OF BREATH 18 g 5   Blood Glucose Monitoring Suppl (ONE TOUCH ULTRA SYSTEM KIT) W/DEVICE KIT 1 kit by Does not apply route once. 100 each 12   BREZTRI AEROSPHERE 160-9-4.8 MCG/ACT AERO INHALE 2 PUFFS INTO THE LUNGS 2 TIMES A DAY. 10.7 g 3   Budeson-Glycopyrrol-Formoterol (BREZTRI AEROSPHERE) 160-9-4.8 MCG/ACT AERO Inhale 2 puffs into the lungs in the morning and at bedtime. 5.9 g 0   citalopram (CELEXA) 20 MG tablet Take 1 tablet (20 mg total) by mouth daily. 30 tablet 3   cyclobenzaprine (FLEXERIL) 10 MG tablet TAKE ONE TABLET BY MOUTH AT BEDTIME 30 tablet 3   furosemide (LASIX) 20 MG tablet Take 20 mg by mouth daily.     glucose blood test strip Use to check blood sugar three times a day. 200 each 12   hydrOXYzine (ATARAX/VISTARIL) 25 MG tablet Take 1 tablet (25 mg total) by  mouth as directed. Take 1-2 tablets by mouth every 4-6 hours as needed for itch. MAY MAKE DROWSY 30 tablet 0   losartan (COZAAR) 25 MG tablet Take 0.5 tablets (12.5 mg total) by mouth daily. 30 tablet 5   meloxicam (MOBIC) 15 MG tablet TAKE 1 TABLET BY MOUTH DAILY 30 tablet 3   metFORMIN (GLUCOPHAGE) 500 MG tablet Please take 1000 mg oral twice daily for next 10 days, then decrease to 5:30 100 mg oral twice daily thereafter. 180 tablet 1   nystatin (MYCOSTATIN) 100000 UNIT/ML suspension TAKE 5 ML BY MOUTH 4 TIMES DAILY 60 mL 0   OZEMPIC, 0.25 OR 0.5 MG/DOSE, 2 MG/1.5ML  SOPN INJECT 0.5 MG INTO THE SKIN ONCE A WEEK 4.5 mL 5   pantoprazole (PROTONIX) 40 MG tablet Take 1 tablet (40 mg total) by mouth daily. 90 tablet 4   rosuvastatin (CRESTOR) 20 MG tablet Take 1 tablet (20 mg total) by mouth daily. 90 tablet 3   traZODone (DESYREL) 100 MG tablet TAKE 1 TABLET BY MOUTH AT BEDTIME AS NEEDED FOR SLEEP 90 tablet 0   verapamil (CALAN-SR) 120 MG CR tablet Take 1 tablet (120 mg total) by mouth at bedtime. 30 tablet 5   vitamin B-12 (CYANOCOBALAMIN) 1000 MCG tablet Take 1 tablet (1,000 mcg total) by mouth daily. 90 tablet 2   No facility-administered medications prior to visit.    Allergies  Allergen Reactions   Doxycycline Hyclate Anaphylaxis, Swelling and Rash   Acetaminophen    Aspirin    Latex Rash   Vancomycin Rash    All mycins    Review of Systems  Constitutional:  Positive for fatigue. Negative for fever.  HENT:  Positive for congestion, ear pain, postnasal drip and sinus pressure. Negative for sore throat.   Eyes: Negative.   Respiratory:  Positive for cough, chest tightness, shortness of breath and wheezing.   Cardiovascular: Negative.   Gastrointestinal:  Positive for nausea. Negative for abdominal pain.  Genitourinary: Negative.   Skin: Negative.   Neurological:  Positive for headaches. Negative for dizziness.      Objective:    Physical Exam Vitals and nursing note reviewed.   Constitutional:      General: She is not in acute distress.    Appearance: Normal appearance.  HENT:     Head: Normocephalic.     Right Ear: Tympanic membrane, ear canal and external ear normal.     Left Ear: Ear canal and external ear normal. Tympanic membrane is erythematous.  Eyes:     Conjunctiva/sclera: Conjunctivae normal.  Cardiovascular:     Rate and Rhythm: Normal rate and regular rhythm.     Pulses: Normal pulses.     Heart sounds: Normal heart sounds.  Pulmonary:     Effort: Pulmonary effort is normal.     Breath sounds: Examination of the right-upper field reveals wheezing and rhonchi. Examination of the left-upper field reveals wheezing and rhonchi. Examination of the right-middle field reveals wheezing and rhonchi. Examination of the left-middle field reveals wheezing and rhonchi. Examination of the right-lower field reveals wheezing and rhonchi. Examination of the left-lower field reveals wheezing and rhonchi. Wheezing and rhonchi present.  Musculoskeletal:     Cervical back: Normal range of motion.  Skin:    General: Skin is warm.  Neurological:     General: No focal deficit present.     Mental Status: She is alert and oriented to person, place, and time.  Psychiatric:        Mood and Affect: Mood normal.        Behavior: Behavior normal.        Thought Content: Thought content normal.        Judgment: Judgment normal.    BP 108/75   Pulse 67   Temp 98.1 F (36.7 C) (Oral)   Wt 217 lb 12.8 oz (98.8 kg)   SpO2 90%   BMI 34.11 kg/m  Wt Readings from Last 3 Encounters:  01/28/21 217 lb 12.8 oz (98.8 kg)  10/27/20 219 lb (99.3 kg)  10/02/20 218 lb 6 oz (99.1 kg)    Health Maintenance Due  Topic Date Due   Pneumococcal Vaccine  91-74 Years old (2 - PCV) 02/08/2017   MAMMOGRAM  03/26/2020   INFLUENZA VACCINE  11/02/2020   HEMOGLOBIN A1C  01/24/2021    There are no preventive care reminders to display for this patient.   Lab Results  Component Value  Date   TSH 1.030 08/14/2020   Lab Results  Component Value Date   WBC 9.1 08/14/2020   HGB 12.5 08/14/2020   HCT 37.5 08/14/2020   MCV 88 08/14/2020   PLT 262 08/14/2020   Lab Results  Component Value Date   NA 140 08/14/2020   K 4.0 08/14/2020   CO2 25 08/14/2020   GLUCOSE 163 (H) 08/14/2020   BUN 12 08/14/2020   CREATININE 0.76 08/14/2020   BILITOT 0.2 08/14/2020   ALKPHOS 91 08/14/2020   AST 22 08/14/2020   ALT 27 08/14/2020   PROT 6.0 08/14/2020   ALBUMIN 4.2 08/14/2020   CALCIUM 9.1 08/14/2020   ANIONGAP 10 07/27/2020   EGFR 89 08/14/2020   Lab Results  Component Value Date   CHOL 181 05/13/2020   Lab Results  Component Value Date   HDL 44 05/13/2020   Lab Results  Component Value Date   LDLCALC 101 (H) 05/13/2020   Lab Results  Component Value Date   TRIG 211 (H) 05/13/2020   Lab Results  Component Value Date   CHOLHDL 4.1 05/13/2020   Lab Results  Component Value Date   HGBA1C 7.1 (H) 07/25/2020       Assessment & Plan:   Problem List Items Addressed This Visit       Respiratory   COPD exacerbation ([place])    Acute x1 week. Oxygen levels 90% on room air when arrived, then 92% on recheck. She gets extremely short of breath with minimal exertion. She is refusing to go to the Emergency Room today. She does not have oxygen at home. She last used her albuterol nebulizer around 9am. Will give solumedrol 32m IM x1 in office today. Start prednisone taper tomorrow. Start augmentin today. Discussed smoking cessation. She can take mucinex OTC and perform deep breathing exercises at home. Discussed strict ER precautions. She will monitor her oxygen saturation levels at home. Follow up Monday or Tuesday next week.       Relevant Medications   predniSONE (DELTASONE) 10 MG tablet   methylPREDNISolone sodium succinate (SOLU-MEDROL) 40 mg/mL injection 40 mg   Other Visit Diagnoses     Acute cough    -  Primary   Check covid-19 and flu today. See plan for  COPD exacerbation   Relevant Orders   Veritor Flu A/B Waived   Novel Coronavirus, NAA ([facility])        Meds ordered this encounter  Medications   predniSONE (DELTASONE) 10 MG tablet    Sig: Take 6 tablets today and tomorrow, then 5 tablets then 2 days, then decrease by 1 tablet every other day until gone    Dispense:  42 tablet    Refill:  0   amoxicillin-clavulanate (AUGMENTIN) 875-125 MG tablet    Sig: Take 1 tablet by mouth 2 (two) times daily.    Dispense:  20 tablet    Refill:  0   DISCONTD: triamcinolone acetonide (KENALOG-40) injection 40 mg   methylPREDNISolone sodium succinate (SOLU-MEDROL) 40 mg/mL injection 40 mg   A total of 30 minutes were spent on this encounter today. When total time is documented, this includes both the face-to-face and non-face-to-face time personally spent before, during and after the visit  on the date of the encounter.   Charyl Dancer, NP

## 2021-01-28 NOTE — Patient Instructions (Addendum)
Start prednisone tomorrow We will give you a steroid injection in the office today Take the antibiotic twice a day for 10 days Take mucinex OTC  Use your albuterol nebulizer every 4 hours for the next 2 days, then as needed Go to the ER if your oxygen levels go below 90% or any worsening breathing

## 2021-01-29 LAB — NOVEL CORONAVIRUS, NAA: SARS-CoV-2, NAA: NOT DETECTED

## 2021-01-29 LAB — SARS-COV-2, NAA 2 DAY TAT

## 2021-03-19 ENCOUNTER — Other Ambulatory Visit: Payer: Self-pay | Admitting: Nurse Practitioner

## 2021-03-20 NOTE — Telephone Encounter (Signed)
Requested Prescriptions  Pending Prescriptions Disp Refills   hydrOXYzine (ATARAX) 25 MG tablet [Pharmacy Med Name: HYDROXYZINE HCL 25 MG TAB] 180 tablet 0    Sig: TAKE 1 TO 2 TABLETS BY MOUTH EVERY 4 TO 6 HOURS AS NEEDED FOR ITCHING. MAY CAUSEDROWSINESS.     Ear, Nose, and Throat:  Antihistamines Passed - 03/19/2021  3:38 PM      Passed - Valid encounter within last 12 months    Recent Outpatient Visits          1 month ago Acute cough   [place], Lauren A, NP   6 months ago Centrilobular emphysema ([place])   [place], Jolene T, NP   7 months ago COPD exacerbation ([place])   [place] Cannady, [place] T, NP   8 months ago COPD exacerbation ([place])   [place] Cannady, Jolene T, NP   10 months ago Type 2 diabetes, controlled, with peripheral circulatory disorder ([place])   [place], Barbaraann Faster, NP      Future Appointments            In 1 week Cannady, Barbaraann Faster, NP [facility], [facility]   In 1 month  [facility], [facility]

## 2021-03-22 ENCOUNTER — Other Ambulatory Visit: Payer: Self-pay | Admitting: Nurse Practitioner

## 2021-03-22 NOTE — Telephone Encounter (Signed)
Requested medication (s) are due for refill today: yes  Requested medication (s) are on the active medication list: yes  Last refill:  11/16/20  Future visit scheduled: 03/31/21  Notes to clinic:  prescriber not at this practice, has appt with NP Cannady later this month. Please assess.   Requested Prescriptions  Pending Prescriptions Disp Refills   hydrOXYzine (ATARAX) 25 MG tablet [Pharmacy Med Name: HYDROXYZINE HCL 25 MG TAB] 180 tablet     Sig: TAKE 1 TO 2 TABLETS BY MOUTH EVERY 4 TO 6 HOURS AS NEEDED FOR ITCHING. MAY CAUSEDROWSINESS.     Ear, Nose, and Throat:  Antihistamines Passed - 03/22/2021 10:46 AM      Passed - Valid encounter within last 12 months    Recent Outpatient Visits           1 month ago Acute cough   [place], Lauren A, NP   6 months ago Centrilobular emphysema ([place])   [place], Jolene T, NP   7 months ago COPD exacerbation ([place])   [place], [place] T, NP   8 months ago COPD exacerbation ([place])   [place] Cannady, Jolene T, NP   10 months ago Type 2 diabetes, controlled, with peripheral circulatory disorder ([place])   [place], Barbaraann Faster, NP       Future Appointments             In 1 week Cannady, Barbaraann Faster, NP [facility]   In 1 month  [facility]

## 2021-03-24 NOTE — Telephone Encounter (Signed)
Pt called in to follow up on this refill request, requested a call back.

## 2021-03-26 DIAGNOSIS — M1712 Unilateral primary osteoarthritis, left knee: Secondary | ICD-10-CM | POA: Diagnosis not present

## 2021-03-31 ENCOUNTER — Other Ambulatory Visit: Payer: Self-pay

## 2021-03-31 ENCOUNTER — Encounter: Payer: Self-pay | Admitting: Nurse Practitioner

## 2021-03-31 ENCOUNTER — Ambulatory Visit (INDEPENDENT_AMBULATORY_CARE_PROVIDER_SITE_OTHER): Payer: Medicare Other | Admitting: Nurse Practitioner

## 2021-03-31 VITALS — BP 139/80 | HR 80 | Temp 98.4°F | Ht 67.0 in | Wt 217.6 lb

## 2021-03-31 DIAGNOSIS — E538 Deficiency of other specified B group vitamins: Secondary | ICD-10-CM

## 2021-03-31 DIAGNOSIS — E1159 Type 2 diabetes mellitus with other circulatory complications: Secondary | ICD-10-CM | POA: Diagnosis not present

## 2021-03-31 DIAGNOSIS — E559 Vitamin D deficiency, unspecified: Secondary | ICD-10-CM | POA: Diagnosis not present

## 2021-03-31 DIAGNOSIS — Z6834 Body mass index (BMI) 34.0-34.9, adult: Secondary | ICD-10-CM

## 2021-03-31 DIAGNOSIS — E1169 Type 2 diabetes mellitus with other specified complication: Secondary | ICD-10-CM

## 2021-03-31 DIAGNOSIS — I700 Atherosclerosis of aorta: Secondary | ICD-10-CM | POA: Diagnosis not present

## 2021-03-31 DIAGNOSIS — H9202 Otalgia, left ear: Secondary | ICD-10-CM | POA: Diagnosis not present

## 2021-03-31 DIAGNOSIS — F17219 Nicotine dependence, cigarettes, with unspecified nicotine-induced disorders: Secondary | ICD-10-CM

## 2021-03-31 DIAGNOSIS — F411 Generalized anxiety disorder: Secondary | ICD-10-CM

## 2021-03-31 DIAGNOSIS — I152 Hypertension secondary to endocrine disorders: Secondary | ICD-10-CM | POA: Diagnosis not present

## 2021-03-31 DIAGNOSIS — J9611 Chronic respiratory failure with hypoxia: Secondary | ICD-10-CM

## 2021-03-31 DIAGNOSIS — I75023 Atheroembolism of bilateral lower extremities: Secondary | ICD-10-CM | POA: Diagnosis not present

## 2021-03-31 DIAGNOSIS — E785 Hyperlipidemia, unspecified: Secondary | ICD-10-CM | POA: Diagnosis not present

## 2021-03-31 DIAGNOSIS — K219 Gastro-esophageal reflux disease without esophagitis: Secondary | ICD-10-CM | POA: Diagnosis not present

## 2021-03-31 DIAGNOSIS — E1151 Type 2 diabetes mellitus with diabetic peripheral angiopathy without gangrene: Secondary | ICD-10-CM

## 2021-03-31 DIAGNOSIS — D692 Other nonthrombocytopenic purpura: Secondary | ICD-10-CM

## 2021-03-31 DIAGNOSIS — E66811 Obesity, class 1: Secondary | ICD-10-CM

## 2021-03-31 DIAGNOSIS — J432 Centrilobular emphysema: Secondary | ICD-10-CM

## 2021-03-31 DIAGNOSIS — J9612 Chronic respiratory failure with hypercapnia: Secondary | ICD-10-CM

## 2021-03-31 DIAGNOSIS — E6609 Other obesity due to excess calories: Secondary | ICD-10-CM

## 2021-03-31 DIAGNOSIS — D519 Vitamin B12 deficiency anemia, unspecified: Secondary | ICD-10-CM | POA: Diagnosis not present

## 2021-03-31 LAB — BAYER DCA HB A1C WAIVED: HB A1C (BAYER DCA - WAIVED): 7.6 % — ABNORMAL HIGH (ref 4.8–5.6)

## 2021-03-31 MED ORDER — AMOXICILLIN-POT CLAVULANATE 875-125 MG PO TABS: 1.0000 | ORAL_TABLET | Freq: Two times a day (BID) | ORAL | 0 refills | Status: AC

## 2021-03-31 MED ORDER — ROSUVASTATIN CALCIUM 20 MG PO TABS: 20.0000 mg | ORAL_TABLET | Freq: Every day | ORAL | 4 refills | Status: DC

## 2021-03-31 MED ORDER — PREDNISONE 20 MG PO TABS: 40.0000 mg | ORAL_TABLET | Freq: Every day | ORAL | 0 refills | Status: AC

## 2021-03-31 MED ORDER — HYDROXYZINE HCL 25 MG PO TABS: ORAL_TABLET | ORAL | 4 refills | Status: DC

## 2021-03-31 MED ORDER — OLMESARTAN MEDOXOMIL 5 MG PO TABS: 5.0000 mg | ORAL_TABLET | Freq: Every day | ORAL | 4 refills | Status: DC

## 2021-03-31 MED ORDER — METFORMIN HCL 500 MG PO TABS: 1000.0000 mg | ORAL_TABLET | Freq: Two times a day (BID) | ORAL | 4 refills | Status: DC

## 2021-03-31 MED ORDER — SEMAGLUTIDE (1 MG/DOSE) 4 MG/3ML ~~LOC~~ SOPN: 1.0000 mg | PEN_INJECTOR | SUBCUTANEOUS | 4 refills | Status: DC

## 2021-03-31 MED ORDER — VERAPAMIL HCL ER 120 MG PO TBCR: 120.0000 mg | EXTENDED_RELEASE_TABLET | Freq: Every day | ORAL | 4 refills | Status: DC

## 2021-03-31 MED ORDER — PANTOPRAZOLE SODIUM 40 MG PO TBEC: 40.0000 mg | DELAYED_RELEASE_TABLET | Freq: Every day | ORAL | 4 refills | Status: DC

## 2021-03-31 NOTE — Assessment & Plan Note (Signed)
BMI 34.08.  Recommended eating smaller high protein, low fat meals more frequently and exercising 30 mins a day 5 times a week with a goal of 10-15lb weight loss in the next 3 months. Patient voiced their understanding and motivation to adhere to these recommendations.

## 2021-03-31 NOTE — Assessment & Plan Note (Signed)
Chronic, ongoing, recommend complete cessation of smoking, which would benefit GERD symptoms.  Continue PPI at this time, has not tolerated reductions.  Mag level today.

## 2021-03-31 NOTE — Progress Notes (Signed)
BP 139/80    Pulse 80    Temp 98.4 F (36.9 C) (Oral)    Ht 5\' 7"  (1.702 m)    Wt 217 lb 9.6 oz (98.7 kg)    SpO2 95%    BMI 34.08 kg/m    Subjective:    Patient ID: Stacey Gross, female    DOB: Nov 10, 1960, 60 y.o.   MRN: [id number]  HPI: [name] is a 60 y.o. female  Chief Complaint  Patient presents with   Diabetes   Hyperlipidemia   Hypertension   Ear Pain    Patient states she would like to have her L ear checked as she feels it draining down into her throat and draining out of her ear and it is causing her pain. Patient states she has always had issues with her ears. Patient states it will clear up on its own and then come back.    Chest Congestion    Patient states she is having chest congestion from the drainage and when she lays she can't breathe. Patient states the mucus is so thick that when she coughs barely any phlegm comes out and when it does it is just thick. Patient states she has had the congestion for a couple of week. Patient has tried Mucinex and Tylenol.    EAR PAIN Started two weeks ago -- left ear.  Takes peroxide and Q-tip.  Has history of TM opening to left ear, went to ENT and they tried to cover but it did not hold. Duration: days Involved ear(s): left Severity:  6/10  Quality:  dull and aching Fever: no Otorrhea: yes Upper respiratory infection symptoms: no Pruritus: yes Hearing loss: no Water immersion no Using Q-tips: yes Recurrent otitis media: no Status: fluctuating Treatments attempted: none  DIABETES A1C last in April 7.1%.  Continues on Metformin 500 MG BID and Ozempic 0.5MG  weekly.  Her B12 level in February -- 323 -- not taking daily supplement.  Refuses Covid and flu vaccination. Hypoglycemic episodes:no Polydipsia/polyuria: no Visual disturbance: no Chest pain: no Paresthesias: no Glucose Monitoring: yes             Accucheck frequency: twice a week             Fasting glucose: 110 in morning             Post  prandial:             Evening:             Before meals: Taking Insulin?: no             Long acting insulin:             Short acting insulin: Blood Pressure Monitoring: not checking Retinal Examination: Not up to Date Foot Exam: Up to Date Pneumovax: Up to Date Influenza: Not Up to Date -- refuses today Aspirin: no    HYPERTENSION / HYPERLIPIDEMIA Was on Losartan 12.5 MG and Crestor 20 MG daily, but not currently taking as it is hard to split Losartan in 1/2 and she was confused on changes with cardiology.   Seeing cardiology and last saw 10/02/20, they started Verapamil 120 MG daily and Losartan 12.5 MG daily.  Is followed by vascular for her blue toe syndrome and last saw February 2021, to follow annually -- missed recent visit due to illness.   Satisfied with current treatment? yes Duration of hypertension: chronic BP monitoring frequency: not checking BP range:  BP medication  side effects: no Duration of hyperlipidemia: chronic Cholesterol medication side effects: no Cholesterol supplements: none Medication compliance: good compliance Aspirin: no Recent stressors: no Recurrent headaches: no Visual changes: no Palpitations: no Dyspnea: at baseline Chest pain: none Lower extremity edema: no Dizzy/lightheaded: no   COPD Continues to smoke 1 PPD, not interested in quitting -- has been smoking since age 30.  Went for lung CT screening 06/07/20.  Seeing pulmonary with last visit 10/27/20 -- they recommended to continue Breztri as in Group D for symptoms and COPD.  Plus Albuterol as needed -- using this frequently, 5-6 times a night.  Feels like suffocating when lying down.  She does not want to go for sleep study.   COPD status: stable Satisfied with current treatment?: yes Oxygen use: no Dyspnea frequency: occasional at baseline Cough frequency: at baseline Rescue inhaler frequency:  every day Limitation of activity: no Productive cough: yes over past two weeks Last  Spirometry: with pulmonary Pneumovax: Up to Date Influenza: Refuses  ANXIETY/STRESS Is currently taking Trazodone only as needed.  Currently only taking Celexa as needed. Duration:stable Anxious mood: yes  Excessive worrying: yes Irritability: yes  Sweating: no Nausea: no Palpitations:no Hyperventilation: no Panic attacks: no Agoraphobia: no  Obscessions/compulsions: no Depressed mood: no Depression screen [facility] 2/9 03/31/2021 05/15/2020 05/13/2020 01/07/2020 01/07/2020  Decreased Interest 0 0 3 0 0  Down, Depressed, Hopeless 0 0 0 0 0  PHQ - 2 Score 0 0 3 0 0  Altered sleeping 0 - 3 1 -  Tired, decreased energy 0 - 3 3 -  Change in appetite 0 - 0 0 -  Feeling bad or failure about yourself  1 - 0 0 -  Trouble concentrating 0 - 3 0 -  Moving slowly or fidgety/restless 0 - 2 0 -  Suicidal thoughts 0 - 0 0 -  PHQ-9 Score 1 - 14 4 -  Difficult doing work/chores Not difficult at all - - Not difficult at all -  Some recent data might be hidden  Anhedonia: no Weight changes: no Insomnia: yes hard to fall asleep  Hypersomnia: no Fatigue/loss of energy: no Feelings of worthlessness: yes Feelings of guilt: yes Impaired concentration/indecisiveness: no Suicidal ideations: no  Crying spells: yes Recent Stressors/Life Changes: yes   Relationship problems: yes   Family stress: no     Financial stress: no    Job stress: no    Recent death/loss: no GAD 7 : Generalized Anxiety Score 03/31/2021 05/13/2020 12/17/2018  Nervous, Anxious, on Edge 0 3 2  Control/stop worrying 1 3 2   Worry too much - different things 1 3 2   Trouble relaxing 0 3 2  Restless 0 2 2  Easily annoyed or irritable 2 3 2   Afraid - awful might happen 0 1 0  Total GAD 7 Score 4 18 12   Anxiety Difficulty Not difficult at all - Somewhat difficult   Relevant past medical, surgical, family and social history reviewed and updated as indicated. Interim medical history since our last visit reviewed. Allergies and  medications reviewed and updated.  Review of Systems  Constitutional:  Negative for activity change, appetite change, diaphoresis, fatigue and fever.  HENT:  Positive for ear discharge and ear pain.   Respiratory:  Positive for cough (increased over past 2 weeks with ear pain), chest tightness and wheezing. Negative for shortness of breath.   Cardiovascular:  Negative for chest pain, palpitations and leg swelling.  Gastrointestinal: Negative.   Endocrine: Negative for polydipsia, polyphagia and  polyuria.  Neurological: Negative.   Psychiatric/Behavioral: Negative.  Negative for sleep disturbance and suicidal ideas.    Per HPI unless specifically indicated above     Objective:    BP 139/80    Pulse 80    Temp 98.4 F (36.9 C) (Oral)    Ht 5\' 7"  (1.702 m)    Wt 217 lb 9.6 oz (98.7 kg)    SpO2 95%    BMI 34.08 kg/m   Wt Readings from Last 3 Encounters:  03/31/21 217 lb 9.6 oz (98.7 kg)  01/28/21 217 lb 12.8 oz (98.8 kg)  10/27/20 219 lb (99.3 kg)    Physical Exam Vitals and nursing note reviewed.  Constitutional:      General: She is awake.     Appearance: She is well-developed and well-groomed. She is obese.  HENT:     Head: Normocephalic.     Right Ear: Hearing, tympanic membrane, ear canal and external ear normal. No drainage.     Left Ear: Hearing, ear canal and external ear normal. Drainage present. No tenderness. Tympanic membrane is injected.     Ears:     Comments: Right ear normal.  Left ear with injection to TM and drainage with know perforation, ongoing.    Nose: Nose normal.     Right Sinus: No maxillary sinus tenderness or frontal sinus tenderness.     Left Sinus: No maxillary sinus tenderness or frontal sinus tenderness.     Mouth/Throat:     Mouth: Mucous membranes are moist.  Eyes:     General: Lids are normal.        Right eye: No discharge.        Left eye: No discharge.     Conjunctiva/sclera: Conjunctivae normal.     Pupils: Pupils are equal, round, and  reactive to light.  Neck:     Thyroid: No thyromegaly.     Vascular: No carotid bruit.  Cardiovascular:     Rate and Rhythm: Normal rate and regular rhythm.     Heart sounds: Normal heart sounds. No murmur heard.   No gallop.  Pulmonary:     Effort: Pulmonary effort is normal. No accessory muscle usage or respiratory distress.     Breath sounds: Wheezing present.     Comments: Scattered expiratory wheezes throughout noted. Abdominal:     General: Bowel sounds are normal.     Palpations: Abdomen is soft.  Musculoskeletal:     Cervical back: Normal range of motion and neck supple.     Right lower leg: No edema.     Left lower leg: No edema.  Skin:    General: Skin is warm and dry.     Comments: Scattered pale purple bruising bilateral upper extremities.  Neurological:     Mental Status: She is alert and oriented to person, place, and time.  Psychiatric:        Attention and Perception: Attention normal.        Mood and Affect: Mood normal.        Speech: Speech normal.        Behavior: Behavior normal. Behavior is cooperative.        Thought Content: Thought content normal.   Results for orders placed or performed in visit on 01/28/21  Novel Coronavirus, NAA ([facility])   Specimen: Nasopharyngeal(NP) swabs in vial transport medium  Result Value Ref Range   SARS-CoV-2, NAA Not Detected Not Detected  SARS-COV-2, NAA 2 DAY TAT  Result Value Ref  Range   SARS-CoV-2, NAA 2 DAY TAT Performed   Veritor Flu A/B Waived  Result Value Ref Range   Influenza A Negative Negative   Influenza B Negative Negative      Assessment & Plan:   Problem List Items Addressed This Visit       Cardiovascular and Mediastinum   Aortic atherosclerosis ([place])    Chronic, ongoing noted on lung CT screening.  Continue statin and ASA daily for prevention.  Continue collaboration with vascular.  Recommend complete cessation smoking.      Relevant Medications   olmesartan (BENICAR) 5 MG tablet    verapamil (CALAN-SR) 120 MG CR tablet   rosuvastatin (CRESTOR) 20 MG tablet   Blue toe syndrome (HCC)    Chronic, ongoing.  Continue collaboration with vascular.  Recommend she return for visit with them.      Relevant Medications   olmesartan (BENICAR) 5 MG tablet   verapamil (CALAN-SR) 120 MG CR tablet   rosuvastatin (CRESTOR) 20 MG tablet   Hypertension associated with diabetes ([place])    Chronic, stable with BP slightly above goal -- educated her on cardiology changes made and recommend she start these, will change Losartan to Olmesartan 5 MG to allow for ease of dosing medications.  Recommend she monitor BP at home regularly and document for provider + focus on DASH diet.  CMP, CBC today.  Return in 3 months.      Relevant Medications   olmesartan (BENICAR) 5 MG tablet   verapamil (CALAN-SR) 120 MG CR tablet   rosuvastatin (CRESTOR) 20 MG tablet   metFORMIN (GLUCOPHAGE) 500 MG tablet   Semaglutide, 1 MG/DOSE, 4 MG/3ML SOPN   Other Relevant Orders   Bayer DCA Hb A1c Waived   Comprehensive metabolic panel   Senile purpura (HCC)    Bilateral upper extremity.  Recommend gentle skin cleansing daily and application of lotion daily.  Monitor skin and notify provider if any abrasions or wounds.      Relevant Medications   olmesartan (BENICAR) 5 MG tablet   verapamil (CALAN-SR) 120 MG CR tablet   rosuvastatin (CRESTOR) 20 MG tablet   Type 2 diabetes, controlled, with peripheral circulatory disorder (HCC) - Primary    Chronic, ongoing with A1C upward today 7.6% -- she reports trying to work on diet at home.  Urine ALB 30 and A:C <30 in January 2022.  Recommend she take Olmesartan for kidney protection and check CMP today.  Increase Ozempic to 1 MG weekly (may use two 0.5 MG injections weekly until current pens completed and then pick up new pens) and continue Metformin.  Check BS BID.  Monitor diet at home and reduce carbs and foods high in sugar.  Continue to collaborate with vascular.   Return in 3 months.      Relevant Medications   olmesartan (BENICAR) 5 MG tablet   verapamil (CALAN-SR) 120 MG CR tablet   rosuvastatin (CRESTOR) 20 MG tablet   metFORMIN (GLUCOPHAGE) 500 MG tablet   Semaglutide, 1 MG/DOSE, 4 MG/3ML SOPN   Other Relevant Orders   Bayer DCA Hb A1c Waived     Respiratory   Centrilobular emphysema (HCC)    Chronic, ongoing with current increased cough x 2 weeks, possible exacerbation present.  Continue Trelegy daily and Albuterol as needed.  Recommend complete cessation of smoking.  Continue annual CT scans.  Continue collaboration with pulmonary, which is beneficial.  Will send in Prednisone 40 MG x 5 days (she is aware this may increase  BS) and Augmentin for exacerbation.        Relevant Medications   predniSONE (DELTASONE) 20 MG tablet   Chronic respiratory failure with hypoxia and hypercapnia (HCC)    Continue to collaborate with pulmonary.        Digestive   GERD (gastroesophageal reflux disease)    Chronic, ongoing, recommend complete cessation of smoking, which would benefit GERD symptoms.  Continue PPI at this time, has not tolerated reductions.  Mag level today.      Relevant Medications   pantoprazole (PROTONIX) 40 MG tablet   Other Relevant Orders   Magnesium     Endocrine   Hyperlipidemia associated with type 2 diabetes mellitus (HCC)    Chronic, ongoing.  Highly recommend she restart Crestor and adjust dosing as needed or add on Zetia if poor control.  Lipid check today.        Relevant Medications   olmesartan (BENICAR) 5 MG tablet   verapamil (CALAN-SR) 120 MG CR tablet   rosuvastatin (CRESTOR) 20 MG tablet   metFORMIN (GLUCOPHAGE) 500 MG tablet   Semaglutide, 1 MG/DOSE, 4 MG/3ML SOPN   Other Relevant Orders   Bayer DCA Hb A1c Waived   CBC with Differential/Platelet   Lipid Panel w/o Chol/HDL Ratio     Nervous and Auditory   Nicotine dependence, cigarettes, w unsp disorders    I have recommended complete cessation of  tobacco use. I have discussed various options available for assistance with tobacco cessation including over the counter methods (Nicotine gum, patch and lozenges). We also discussed prescription options (Chantix, Nicotine Inhaler / Nasal Spray). The patient is not interested in pursuing any prescription tobacco cessation options at this time.  Continue yearly lung screening.         Other   B12 deficiency    Chronic, continue supplement and recheck level today.  Is on long term Metformin.      Relevant Orders   Vitamin B12   Generalized anxiety disorder    Chronic, stable -- she has discontinued a majority of medication and is using Celexa as needed only.        Relevant Medications   hydrOXYzine (ATARAX) 25 MG tablet   Left ear pain    Acute and frequent irritation to ear -- has seen ENT with no benefit.  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 Send in Augmentin today.  Continue to monitor and return for worsening symptoms.      Obesity    BMI 34.08.  Recommended eating smaller high protein, low fat meals more frequently and exercising 30 mins a day 5 times a week with a goal of 10-15lb weight loss in the next 3 months. Patient voiced their understanding and motivation to adhere to these recommendations.       Relevant Medications   metFORMIN (GLUCOPHAGE) 500 MG tablet   Semaglutide, 1 MG/DOSE, 4 MG/3ML SOPN   Other Visit Diagnoses     Vitamin D deficiency       History of low levels, check today and start supplement as needed.   Relevant Orders   VITAMIN D 25 Hydroxy (Vit-D Deficiency, Fractures)         Follow up plan: Return in about 3 months (around 06/29/2021) for T2DM, HTN/HLD, COPD, GERD, MOOD.

## 2021-03-31 NOTE — Assessment & Plan Note (Signed)
Chronic, ongoing.  Highly recommend she restart Crestor and adjust dosing as needed or add on Zetia if poor control.  Lipid check today.

## 2021-03-31 NOTE — Assessment & Plan Note (Signed)
Continue to collaborate with pulmonary. 

## 2021-03-31 NOTE — Assessment & Plan Note (Signed)
I have recommended complete cessation of tobacco use. I have discussed various options available for assistance with tobacco cessation including over the counter methods (Nicotine gum, patch and lozenges). We also discussed prescription options (Chantix, Nicotine Inhaler / Nasal Spray). The patient is not interested in pursuing any prescription tobacco cessation options at this time.  Continue yearly lung screening.  

## 2021-03-31 NOTE — Assessment & Plan Note (Signed)
Chronic, continue supplement and recheck level today.  Is on long term Metformin. 

## 2021-03-31 NOTE — Assessment & Plan Note (Signed)
Chronic, stable with BP slightly above goal -- educated her on cardiology changes made and recommend she start these, will change Losartan to Olmesartan 5 MG to allow for ease of dosing medications.  Recommend she monitor BP at home regularly and document for provider + focus on DASH diet.  CMP, CBC today.  Return in 3 months.

## 2021-03-31 NOTE — Patient Instructions (Signed)

## 2021-03-31 NOTE — Assessment & Plan Note (Signed)
Chronic, ongoing with current increased cough x 2 weeks, possible exacerbation present.  Continue Trelegy daily and Albuterol as needed.  Recommend complete cessation of smoking.  Continue annual CT scans.  Continue collaboration with pulmonary, which is beneficial.  Will send in Prednisone 40 MG x 5 days (she is aware this may increase BS) and Augmentin for exacerbation.

## 2021-03-31 NOTE — Assessment & Plan Note (Signed)
Acute and frequent irritation to ear -- has seen ENT with no benefit.  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 Send in Augmentin today.  Continue to monitor and return for worsening symptoms.

## 2021-03-31 NOTE — Assessment & Plan Note (Signed)
Bilateral upper extremity.  Recommend gentle skin cleansing daily and application of lotion daily.  Monitor skin and notify provider if any abrasions or wounds. 

## 2021-03-31 NOTE — Assessment & Plan Note (Signed)
Chronic, ongoing noted on lung CT screening.  Continue statin and ASA daily for prevention.  Continue collaboration with vascular.  Recommend complete cessation smoking. 

## 2021-03-31 NOTE — Assessment & Plan Note (Signed)
Chronic, stable -- she has discontinued a majority of medication and is using Celexa as needed only.

## 2021-03-31 NOTE — Assessment & Plan Note (Signed)
Chronic, ongoing with A1C upward today 7.6% -- she reports trying to work on diet at home.  Urine ALB 30 and A:C <30 in January 2022.  Recommend she take Olmesartan for kidney protection and check CMP today.  Increase Ozempic to 1 MG weekly (may use two 0.5 MG injections weekly until current pens completed and then pick up new pens) and continue Metformin.  Check BS BID.  Monitor diet at home and reduce carbs and foods high in sugar.  Continue to collaborate with vascular.  Return in 3 months.

## 2021-03-31 NOTE — Assessment & Plan Note (Signed)
Chronic, ongoing.  Continue collaboration with vascular.  Recommend she return for visit with them. 

## 2021-04-01 ENCOUNTER — Other Ambulatory Visit: Payer: Self-pay | Admitting: Nurse Practitioner

## 2021-04-01 DIAGNOSIS — E559 Vitamin D deficiency, unspecified: Secondary | ICD-10-CM | POA: Insufficient documentation

## 2021-04-01 DIAGNOSIS — D729 Disorder of white blood cells, unspecified: Secondary | ICD-10-CM

## 2021-04-01 LAB — CBC WITH DIFFERENTIAL/PLATELET
Basophils Absolute: 0 10*3/uL (ref 0.0–0.2)
Basos: 0 %
EOS (ABSOLUTE): 0.1 10*3/uL (ref 0.0–0.4)
Eos: 1 %
Hematocrit: 47 % — ABNORMAL HIGH (ref 34.0–46.6)
Hemoglobin: 16.7 g/dL — ABNORMAL HIGH (ref 11.1–15.9)
Immature Grans (Abs): 0.1 10*3/uL (ref 0.0–0.1)
Immature Granulocytes: 1 %
Lymphocytes Absolute: 3.4 10*3/uL — ABNORMAL HIGH (ref 0.7–3.1)
Lymphs: 27 %
MCH: 30.7 pg (ref 26.6–33.0)
MCHC: 35.5 g/dL (ref 31.5–35.7)
MCV: 86 fL (ref 79–97)
Monocytes Absolute: 0.9 10*3/uL (ref 0.1–0.9)
Monocytes: 7 %
Neutrophils Absolute: 8.1 10*3/uL — ABNORMAL HIGH (ref 1.4–7.0)
Neutrophils: 64 %
Platelets: 345 10*3/uL (ref 150–450)
RBC: 5.44 x10E6/uL — ABNORMAL HIGH (ref 3.77–5.28)
RDW: 13 % (ref 11.7–15.4)
WBC: 12.5 10*3/uL — ABNORMAL HIGH (ref 3.4–10.8)

## 2021-04-01 LAB — COMPREHENSIVE METABOLIC PANEL
ALT: 29 IU/L (ref 0–32)
AST: 27 IU/L (ref 0–40)
Albumin/Globulin Ratio: 2.2 (ref 1.2–2.2)
Albumin: 4.7 g/dL (ref 3.8–4.8)
Alkaline Phosphatase: 111 IU/L (ref 44–121)
BUN/Creatinine Ratio: 25 (ref 12–28)
BUN: 20 mg/dL (ref 8–27)
Bilirubin Total: 0.4 mg/dL (ref 0.0–1.2)
CO2: 23 mmol/L (ref 20–29)
Calcium: 9.6 mg/dL (ref 8.7–10.3)
Chloride: 97 mmol/L (ref 96–106)
Creatinine, Ser: 0.81 mg/dL (ref 0.57–1.00)
Globulin, Total: 2.1 g/dL (ref 1.5–4.5)
Glucose: 136 mg/dL — ABNORMAL HIGH (ref 70–99)
Potassium: 4.8 mmol/L (ref 3.5–5.2)
Sodium: 135 mmol/L (ref 134–144)
Total Protein: 6.8 g/dL (ref 6.0–8.5)
eGFR: 82 mL/min/{1.73_m2} (ref >59)

## 2021-04-01 LAB — VITAMIN D 25 HYDROXY (VIT D DEFICIENCY, FRACTURES): Vit D, 25-Hydroxy: 8.5 ng/mL — ABNORMAL LOW (ref 30.0–100.0)

## 2021-04-01 LAB — LIPID PANEL W/O CHOL/HDL RATIO
Cholesterol, Total: 273 mg/dL — ABNORMAL HIGH (ref 100–199)
HDL: 47 mg/dL (ref >39)
LDL Chol Calc (NIH): 138 mg/dL — ABNORMAL HIGH (ref 0–99)
Triglycerides: 480 mg/dL — ABNORMAL HIGH (ref 0–149)
VLDL Cholesterol Cal: 88 mg/dL — ABNORMAL HIGH (ref 5–40)

## 2021-04-01 LAB — VITAMIN B12: Vitamin B-12: 269 pg/mL (ref 232–1245)

## 2021-04-01 LAB — MAGNESIUM: Magnesium: 1.8 mg/dL (ref 1.6–2.3)

## 2021-04-01 MED ORDER — CHOLECALCIFEROL 1.25 MG (50000 UT) PO TABS: 1.0000 | ORAL_TABLET | ORAL | 4 refills | Status: DC

## 2021-04-01 NOTE — Progress Notes (Signed)
Contacted via [place] -- please call and ensure she receive message below, as not sure she always checks + needs lab only visit scheduled for 6 weeks: Stacey Gross, your labs have returned: - Kidney and liver function remain normal. - White blood cell count and neutrophils elevated + blood counts up, often this is due to infection -- continue the antibiotic I ordered until complete.  If you continue to feel bad return to office ASAP.  I would like you to return for lab visit only in 6 weeks to recheck these counts.  Please schedule this. - Cholesterol levels were quite elevated, putting you at risk for stroke or heart attack, please ensure to restart Rosuvastatin as we discussed.  We will recheck next visit. - Vitamin D level very low, I have sent in a weekly Vitamin D3 supplement for you to start taking, this is very important for bone and muscle health.  Your B12 is also low, please restart Vitamin B12 1000 MCG daily. - Magnesium level normal. Keep being awesome!!  Thank you for allowing me to participate in your care.  I appreciate you. Kindest regards, Stacey Gross

## 2021-04-01 NOTE — Addendum Note (Signed)
Addended by: Marnee Guarneri T on: 04/01/2021 07:34 PM   Modules accepted: Orders

## 2021-04-20 ENCOUNTER — Ambulatory Visit (INDEPENDENT_AMBULATORY_CARE_PROVIDER_SITE_OTHER): Payer: Medicare Other | Admitting: Internal Medicine

## 2021-04-20 ENCOUNTER — Other Ambulatory Visit: Payer: Self-pay

## 2021-04-20 ENCOUNTER — Telehealth: Payer: Self-pay

## 2021-04-20 ENCOUNTER — Encounter: Payer: Self-pay | Admitting: Internal Medicine

## 2021-04-20 ENCOUNTER — Telehealth: Payer: Self-pay | Admitting: Nurse Practitioner

## 2021-04-20 VITALS — BP 114/61 | HR 77 | Temp 98.5°F | Ht 66.93 in | Wt 215.0 lb

## 2021-04-20 DIAGNOSIS — R197 Diarrhea, unspecified: Secondary | ICD-10-CM | POA: Diagnosis not present

## 2021-04-20 DIAGNOSIS — R112 Nausea with vomiting, unspecified: Secondary | ICD-10-CM | POA: Insufficient documentation

## 2021-04-20 LAB — CBC WITH DIFFERENTIAL/PLATELET
Hematocrit: 41.8 % (ref 34.0–46.6)
Hemoglobin: 14.7 g/dL (ref 11.1–15.9)
Lymphocytes Absolute: 2.9 10*3/uL (ref 0.7–3.1)
Lymphs: 28 %
MCH: 30.4 pg (ref 26.6–33.0)
MCHC: 35.2 g/dL (ref 31.5–35.7)
MCV: 87 fL (ref 79–97)
MID (Absolute): 0.6 10*3/uL (ref 0.1–1.6)
MID: 6 %
Neutrophils Absolute: 7.1 10*3/uL — ABNORMAL HIGH (ref 1.4–7.0)
Neutrophils: 67 %
Platelets: 284 10*3/uL (ref 150–450)
RBC: 4.83 x10E6/uL (ref 3.77–5.28)
RDW: 13.6 % (ref 11.7–15.4)
WBC: 10.6 10*3/uL (ref 3.4–10.8)

## 2021-04-20 MED ORDER — ONDANSETRON HCL 8 MG PO TABS: 8.0000 mg | ORAL_TABLET | Freq: Three times a day (TID) | ORAL | 1 refills | Status: DC | PRN

## 2021-04-20 MED ORDER — LACTINEX PO CHEW: 1.0000 | CHEWABLE_TABLET | Freq: Three times a day (TID) | ORAL | 1 refills | Status: DC | PRN

## 2021-04-20 NOTE — Telephone Encounter (Signed)
SCHEDULED

## 2021-04-20 NOTE — Telephone Encounter (Signed)
Copied from [place] [id number]. Topic: General - Other >> Apr 20, 2021  3:32 PM Alanda Slim E wrote: Reason for CRM: Pt called to let Dr. Neomia Dear know that [facility] cant see her until March 2nd/ pt is asking want Dr. Advises and if there is anything sooner

## 2021-04-20 NOTE — Progress Notes (Signed)
BP 114/61    Pulse 77    Temp 98.5 F (36.9 C) (Oral)    Ht 5' 6.93" (1.7 m)    Wt 215 lb (97.5 kg)    SpO2 98%    BMI 33.75 kg/m    Subjective:    Patient ID: Stacey Gross, female    DOB: Jan 21, 1961, 61 y.o.   MRN: [id number]  Chief Complaint  Patient presents with   Nausea    Has been for a few month. She either is vomiting or having diarrhea    HPI: Stacey Gross is a 61 y.o. female  Emesis  This is a chronic problem. The current episode started 1 to 4 weeks ago. Associated symptoms include abdominal pain and diarrhea. Pertinent negatives include no arthralgias, chills, fever, headaches, myalgias, sweats, URI or weight loss.  Diarrhea  This is a recurrent (x 1 month ago had a biscuit this mornign.) problem. The current episode started more than 1 month ago. The stool consistency is described as Watery. Associated symptoms include abdominal pain and vomiting. Pertinent negatives include no arthralgias, bloating, chills, fever, headaches, increased  flatus, myalgias, sweats, URI or weight loss. Associated symptoms comments: Has epigastric pain when food " hangs out there " has had to have E dilatation in 3 yrs ago ? Approx per pt. .   Chief Complaint  Patient presents with   Nausea    Has been for a few month. She either is vomiting or having diarrhea    Relevant past medical, surgical, family and social history reviewed and updated as indicated. Interim medical history since our last visit reviewed. Allergies and medications reviewed and updated.  Review of Systems  Constitutional:  Negative for chills, fever and weight loss.  Gastrointestinal:  Positive for abdominal pain, diarrhea and vomiting. Negative for bloating and flatus.  Musculoskeletal:  Negative for arthralgias and myalgias.  Neurological:  Negative for headaches.   Per HPI unless specifically indicated above     Objective:    BP 114/61    Pulse 77    Temp 98.5 F (36.9 C) (Oral)    Ht 5' 6.93" (1.7 m)     Wt 215 lb (97.5 kg)    SpO2 98%    BMI 33.75 kg/m   Wt Readings from Last 3 Encounters:  04/20/21 215 lb (97.5 kg)  03/31/21 217 lb 9.6 oz (98.7 kg)  01/28/21 217 lb 12.8 oz (98.8 kg)    Physical Exam Vitals and nursing note reviewed.  Constitutional:      General: She is not in acute distress.    Appearance: Normal appearance. She is not ill-appearing or diaphoretic.  Eyes:     Conjunctiva/sclera: Conjunctivae normal.  Pulmonary:     Breath sounds: No rhonchi.  Abdominal:     General: Abdomen is flat. Bowel sounds are normal. There is no distension.     Palpations: Abdomen is soft. There is no mass.     Tenderness: There is abdominal tenderness. There is no guarding.     Comments: RLQ tenderness noted per pt.  Skin:    General: Skin is warm and dry.     Coloration: Skin is not jaundiced.     Findings: No erythema.  Neurological:     Mental Status: She is alert.    Results for orders placed or performed in visit on 03/31/21  Bayer DCA Hb A1c Waived  Result Value Ref Range   HB A1C (BAYER DCA - WAIVED) 7.6 (H)  4.8 - 5.6 %  Comprehensive metabolic panel  Result Value Ref Range   Glucose 136 (H) 70 - 99 mg/dL   BUN 20 8 - 27 mg/dL   Creatinine, Ser 0.81 0.57 - 1.00 mg/dL   eGFR 82 >59 mL/min/1.73   BUN/Creatinine Ratio 25 12 - 28   Sodium 135 134 - 144 mmol/L   Potassium 4.8 3.5 - 5.2 mmol/L   Chloride 97 96 - 106 mmol/L   CO2 23 20 - 29 mmol/L   Calcium 9.6 8.7 - 10.3 mg/dL   Total Protein 6.8 6.0 - 8.5 g/dL   Albumin 4.7 3.8 - 4.8 g/dL   Globulin, Total 2.1 1.5 - 4.5 g/dL   Albumin/Globulin Ratio 2.2 1.2 - 2.2   Bilirubin Total 0.4 0.0 - 1.2 mg/dL   Alkaline Phosphatase 111 44 - 121 IU/L   AST 27 0 - 40 IU/L   ALT 29 0 - 32 IU/L  CBC with Differential/Platelet  Result Value Ref Range   WBC 12.5 (H) 3.4 - 10.8 x10E3/uL   RBC 5.44 (H) 3.77 - 5.28 x10E6/uL   Hemoglobin 16.7 (H) 11.1 - 15.9 g/dL   Hematocrit 47.0 (H) 34.0 - 46.6 %   MCV 86 79 - 97 fL   MCH  30.7 26.6 - 33.0 pg   MCHC 35.5 31.5 - 35.7 g/dL   RDW 13.0 11.7 - 15.4 %   Platelets 345 150 - 450 x10E3/uL   Neutrophils 64 Not Estab. %   Lymphs 27 Not Estab. %   Monocytes 7 Not Estab. %   Eos 1 Not Estab. %   Basos 0 Not Estab. %   Neutrophils Absolute 8.1 (H) 1.4 - 7.0 x10E3/uL   Lymphocytes Absolute 3.4 (H) 0.7 - 3.1 x10E3/uL   Monocytes Absolute 0.9 0.1 - 0.9 x10E3/uL   EOS (ABSOLUTE) 0.1 0.0 - 0.4 x10E3/uL   Basophils Absolute 0.0 0.0 - 0.2 x10E3/uL   Immature Granulocytes 1 Not Estab. %   Immature Grans (Abs) 0.1 0.0 - 0.1 x10E3/uL  Lipid Panel w/o Chol/HDL Ratio  Result Value Ref Range   Cholesterol, Total 273 (H) 100 - 199 mg/dL   Triglycerides 480 (H) 0 - 149 mg/dL   HDL 47 >39 mg/dL   VLDL Cholesterol Cal 88 (H) 5 - 40 mg/dL   LDL Chol Calc (NIH) 138 (H) 0 - 99 mg/dL  VITAMIN D 25 Hydroxy (Vit-D Deficiency, Fractures)  Result Value Ref Range   Vit D, 25-Hydroxy 8.5 (L) 30.0 - 100.0 ng/mL  Vitamin B12  Result Value Ref Range   Vitamin B-12 269 232 - 1,245 pg/mL  Magnesium  Result Value Ref Range   Magnesium 1.8 1.6 - 2.3 mg/dL        Current Outpatient Medications:    albuterol (PROVENTIL) (2.5 MG/3ML) 0.083% nebulizer solution, USE 1 VIAL (3 ML = 2.5 MG TOTAL) BY NEBULIZATION EVERY 6 HOURS AS NEEDED FORWHEEZING OR SHORTNESS OF BREATH, Disp: 180 mL, Rfl: 4   albuterol (VENTOLIN HFA) 108 (90 Base) MCG/ACT inhaler, INHALE 2 PUFFS BY MOUTH INTO THE LUNGS EVERY 6 HOURS AS NEEDED FOR WHEEZING OR SHORTNESS OF BREATH, Disp: 18 g, Rfl: 5   Blood Glucose Monitoring Suppl (ONE TOUCH ULTRA SYSTEM KIT) W/DEVICE KIT, 1 kit by Does not apply route once., Disp: 100 each, Rfl: 12   Budeson-Glycopyrrol-Formoterol (BREZTRI AEROSPHERE) 160-9-4.8 MCG/ACT AERO, Inhale 2 puffs into the lungs in the morning and at bedtime., Disp: 5.9 g, Rfl: 0   Cholecalciferol 1.25 MG (50000 UT)  TABS, Take 1 tablet by mouth once a week., Disp: 12 tablet, Rfl: 4   citalopram (CELEXA) 20 MG tablet,  Take 1 tablet (20 mg total) by mouth daily., Disp: 30 tablet, Rfl: 3   glucose blood test strip, Use to check blood sugar three times a day., Disp: 200 each, Rfl: 12   hydrOXYzine (ATARAX) 25 MG tablet, TAKE 1 TO 2 TABLETS BY MOUTH EVERY 4 TO 6 HOURS AS NEEDED FOR ITCHING. MAY CAUSEDROWSINESS., Disp: 180 tablet, Rfl: 4   meloxicam (MOBIC) 15 MG tablet, TAKE 1 TABLET BY MOUTH DAILY, Disp: 30 tablet, Rfl: 3   metFORMIN (GLUCOPHAGE) 500 MG tablet, Take 2 tablets (1,000 mg total) by mouth 2 (two) times daily with a meal., Disp: 360 tablet, Rfl: 4   olmesartan (BENICAR) 5 MG tablet, Take 1 tablet (5 mg total) by mouth daily., Disp: 90 tablet, Rfl: 4   pantoprazole (PROTONIX) 40 MG tablet, Take 1 tablet (40 mg total) by mouth daily., Disp: 90 tablet, Rfl: 4   rosuvastatin (CRESTOR) 20 MG tablet, Take 1 tablet (20 mg total) by mouth daily., Disp: 90 tablet, Rfl: 4   Semaglutide, 1 MG/DOSE, 4 MG/3ML SOPN, Inject 1 mg as directed once a week., Disp: 9 mL, Rfl: 4   verapamil (CALAN-SR) 120 MG CR tablet, Take 1 tablet (120 mg total) by mouth at bedtime., Disp: 90 tablet, Rfl: 4   vitamin B-12 (CYANOCOBALAMIN) 1000 MCG tablet, Take 1 tablet (1,000 mcg total) by mouth daily., Disp: 90 tablet, Rfl: 2    Assessment & Plan:  Diarrhea / Emesis : has a ho GI malignancy per her verbal record has had diffuculty swallowing food.  - start zofran  for symptomatic control and lactobacillus after every loose stools Check CMP . Stool culture/ Stool studies/ H/ H CMP given diarrhea advised pt to rehydrate, use gatorade for chronic diarrhea.  CT abodmen / pelvis.  GI referral placed. ? Needs EGD  Smoking cessation advised. pt refuses chantix. failed nicotine patches in the past. continues to smoke. more than > 5 - 10 mins of time was spent with pt regarding smoking cessation and complications.            Problem List Items Addressed This Visit   None    No orders of the defined types were placed in this  encounter.    No orders of the defined types were placed in this encounter.    Follow up plan: No follow-ups on file.

## 2021-04-21 LAB — CBC WITH DIFFERENTIAL/PLATELET
Basophils Absolute: 0.1 10*3/uL (ref 0.0–0.2)
Basos: 1 %
EOS (ABSOLUTE): 0.1 10*3/uL (ref 0.0–0.4)
Eos: 1 %
Hematocrit: 42.4 % (ref 34.0–46.6)
Hemoglobin: 14.3 g/dL (ref 11.1–15.9)
Immature Grans (Abs): 0.1 10*3/uL (ref 0.0–0.1)
Immature Granulocytes: 1 %
Lymphocytes Absolute: 2.9 10*3/uL (ref 0.7–3.1)
Lymphs: 29 %
MCH: 29.5 pg (ref 26.6–33.0)
MCHC: 33.7 g/dL (ref 31.5–35.7)
MCV: 87 fL (ref 79–97)
Monocytes Absolute: 0.5 10*3/uL (ref 0.1–0.9)
Monocytes: 5 %
Neutrophils Absolute: 6.5 10*3/uL (ref 1.4–7.0)
Neutrophils: 63 %
Platelets: 297 10*3/uL (ref 150–450)
RBC: 4.85 x10E6/uL (ref 3.77–5.28)
RDW: 12.9 % (ref 11.7–15.4)
WBC: 10.1 10*3/uL (ref 3.4–10.8)

## 2021-04-21 LAB — COMPREHENSIVE METABOLIC PANEL
ALT: 21 IU/L (ref 0–32)
AST: 20 IU/L (ref 0–40)
Albumin/Globulin Ratio: 2.6 — ABNORMAL HIGH (ref 1.2–2.2)
Albumin: 4.5 g/dL (ref 3.8–4.8)
Alkaline Phosphatase: 81 IU/L (ref 44–121)
BUN/Creatinine Ratio: 13 (ref 12–28)
BUN: 12 mg/dL (ref 8–27)
Bilirubin Total: 0.4 mg/dL (ref 0.0–1.2)
CO2: 26 mmol/L (ref 20–29)
Calcium: 9 mg/dL (ref 8.7–10.3)
Chloride: 99 mmol/L (ref 96–106)
Creatinine, Ser: 0.9 mg/dL (ref 0.57–1.00)
Globulin, Total: 1.7 g/dL (ref 1.5–4.5)
Glucose: 100 mg/dL — ABNORMAL HIGH (ref 70–99)
Potassium: 4.4 mmol/L (ref 3.5–5.2)
Sodium: 139 mmol/L (ref 134–144)
Total Protein: 6.2 g/dL (ref 6.0–8.5)
eGFR: 72 mL/min/{1.73_m2} (ref >59)

## 2021-04-23 DIAGNOSIS — R112 Nausea with vomiting, unspecified: Secondary | ICD-10-CM | POA: Diagnosis not present

## 2021-04-23 DIAGNOSIS — R197 Diarrhea, unspecified: Secondary | ICD-10-CM | POA: Diagnosis not present

## 2021-04-27 ENCOUNTER — Ambulatory Visit
Admission: RE | Admit: 2021-04-27 | Discharge: 2021-04-27 | Disposition: A | Discharge status: Home / Self Care | Payer: Medicare Other | Source: Ambulatory Visit | Attending: Internal Medicine | Admitting: Internal Medicine

## 2021-04-27 ENCOUNTER — Other Ambulatory Visit: Payer: Self-pay

## 2021-04-27 DIAGNOSIS — N281 Cyst of kidney, acquired: Secondary | ICD-10-CM | POA: Diagnosis not present

## 2021-04-27 DIAGNOSIS — K760 Fatty (change of) liver, not elsewhere classified: Secondary | ICD-10-CM | POA: Diagnosis not present

## 2021-04-27 DIAGNOSIS — I700 Atherosclerosis of aorta: Secondary | ICD-10-CM | POA: Insufficient documentation

## 2021-04-27 DIAGNOSIS — R197 Diarrhea, unspecified: Secondary | ICD-10-CM | POA: Diagnosis not present

## 2021-04-27 DIAGNOSIS — K5730 Diverticulosis of large intestine without perforation or abscess without bleeding: Secondary | ICD-10-CM | POA: Insufficient documentation

## 2021-04-27 LAB — FECAL OCCULT BLOOD, IMMUNOCHEMICAL: Fecal Occult Bld: NEGATIVE

## 2021-04-27 LAB — STOOL CULTURE: E coli, Shiga toxin Assay: NEGATIVE

## 2021-04-27 MED ORDER — IOHEXOL 300 MG/ML  SOLN
100.0000 mL | Freq: Once | INTRAMUSCULAR | Status: AC | PRN
  Administered 2021-04-27: 15:00:00 100 mL via INTRAVENOUS

## 2021-04-28 NOTE — Progress Notes (Signed)
No acute findings on CT Abdomen please let her know thnx.

## 2021-04-28 NOTE — Progress Notes (Signed)
Please let pt know this was normal.

## 2021-05-08 NOTE — Patient Instructions (Addendum)
Please call to schedule your mammogram and/or bone density: [facility] at [facility]  Address: [address], [place], [place] [address]  Phone: [phone]   Abdominal Pain, Adult Many things can cause belly (abdominal) pain. Most times, belly pain is not dangerous. Many cases of belly pain can be watched and treated at home. Sometimes, though, belly pain is serious. Your doctor will try to find the cause of your belly pain. Follow these instructions at home: Medicines Take over-the-counter and prescription medicines only as told by your doctor. Do not take medicines that help you poop (laxatives) unless told by your doctor. General instructions Watch your belly pain for any changes. Drink enough fluid to keep your pee (urine) pale yellow. Keep all follow-up visits as told by your doctor. This is important. Contact a doctor if: Your belly pain changes or gets worse. You are not hungry, or you lose weight without trying. You are having trouble pooping (constipated) or have watery poop (diarrhea) for more than 2-3 days. You have pain when you pee or poop. Your belly pain wakes you up at night. Your pain gets worse with meals, after eating, or with certain foods. You are vomiting and cannot keep anything down. You have a fever. You have blood in your pee. Get help right away if: Your pain does not go away as soon as your doctor says it should. You cannot stop vomiting. Your pain is only in areas of your belly, such as the right side or the left lower part of the belly. You have bloody or black poop, or poop that looks like tar. You have very bad pain, cramping, or bloating in your belly. You have signs of not having enough fluid or water in your body (dehydration), such as: Dark pee, very little pee, or no pee. Cracked lips. Dry mouth. Sunken eyes. Sleepiness. Weakness. You have trouble breathing or chest pain. Summary Many cases of belly pain can be  watched and treated at home. Watch your belly pain for any changes. Take over-the-counter and prescription medicines only as told by your doctor. Contact a doctor if your belly pain changes or gets worse. Get help right away if you have very bad pain, cramping, or bloating in your belly. This information is not intended to replace advice given to you by your health care provider. Make sure you discuss any questions you have with your health care provider. Document Revised: 07/30/2018 Document Reviewed: 07/30/2018 Elsevier Patient Education  [place].

## 2021-05-10 ENCOUNTER — Other Ambulatory Visit: Payer: Self-pay | Admitting: Nurse Practitioner

## 2021-05-10 ENCOUNTER — Encounter: Payer: Self-pay | Admitting: Nurse Practitioner

## 2021-05-10 ENCOUNTER — Other Ambulatory Visit: Payer: Self-pay

## 2021-05-10 ENCOUNTER — Ambulatory Visit (INDEPENDENT_AMBULATORY_CARE_PROVIDER_SITE_OTHER): Payer: Medicare Other | Admitting: Nurse Practitioner

## 2021-05-10 ENCOUNTER — Telehealth: Payer: Self-pay | Admitting: Nurse Practitioner

## 2021-05-10 VITALS — BP 100/67 | HR 77 | Temp 98.3°F | Ht 66.93 in | Wt 212.6 lb

## 2021-05-10 DIAGNOSIS — Z1231 Encounter for screening mammogram for malignant neoplasm of breast: Secondary | ICD-10-CM | POA: Diagnosis not present

## 2021-05-10 DIAGNOSIS — R197 Diarrhea, unspecified: Secondary | ICD-10-CM | POA: Diagnosis not present

## 2021-05-10 DIAGNOSIS — D692 Other nonthrombocytopenic purpura: Secondary | ICD-10-CM | POA: Diagnosis not present

## 2021-05-10 DIAGNOSIS — R112 Nausea with vomiting, unspecified: Secondary | ICD-10-CM

## 2021-05-10 MED ORDER — ACCU-CHEK GUIDE ME W/DEVICE KIT: PACK | 0 refills | Status: DC

## 2021-05-10 MED ORDER — PROCHLORPERAZINE MALEATE 5 MG PO TABS: 5.0000 mg | ORAL_TABLET | Freq: Four times a day (QID) | ORAL | 0 refills | Status: DC | PRN

## 2021-05-10 MED ORDER — PROCHLORPERAZINE EDISYLATE 10 MG/2ML IJ SOLN: 10.0000 mg | Freq: Four times a day (QID) | INTRAMUSCULAR | 1 refills | Status: DC | PRN

## 2021-05-10 NOTE — Progress Notes (Signed)
BP 100/67    Pulse 77    Temp 98.3 F (36.8 C) (Oral)    Ht 5' 6.93" (1.7 m)    Wt 212 lb 9.6 oz (96.4 kg)    SpO2 92%    BMI 33.37 kg/m    Subjective:    Patient ID: Stacey Gross, female    DOB: 1960-05-22, 61 y.o.   MRN: [id number]  HPI: Stacey Gross is a 61 y.o. female  Chief Complaint  Patient presents with   Nausea    Patient states she is still experiencing nausea and vomiting. Patient states she has had imaging and her results states everything looks normal. Patient states she is not able to keep anything down whether it is a sip of water or cracker. Patient states it has been going on for about a month or longer.    Emesis   ABDOMINAL PAIN  Seen on 04/20/2021 for nausea and vomiting, which had been going on for over a month.  Her stomach is constantly hurting, which she feels is due to nausea.  Is scheduled to see GI on the 20th.  Continues to smoke, about 1 1/2 PPD at this time.  This started around time she started back on Ozempic.  Last A1c in December was 7.6%.  Recent imaging and labs reassuring.  Is colonoscopy -- due 04/24/21. Duration:weeks Onset: gradual Severity: 10/10 at worst, prior to having BM and emesis Quality: cramping Location:  diffuse  Episode duration:  Radiation: no Frequency: intermittent Alleviating factors: "shitting" -- sometimes like water and sometimes not, black on consistency Aggravating factors: eating Status: fluctuating Treatments attempted: Protonix, Zofran (not working), Probiotic (taking 10 times a day) Fever: no Nausea: yes Vomiting: yes Weight loss: yes Decreased appetite: yes Diarrhea: yes Constipation: no Blood in stool: no Heartburn: no Jaundice: no Rash: no Dysuria/urinary frequency: no Hematuria: no History of sexually transmitted disease: no Recurrent NSAID use: no   Relevant past medical, surgical, family and social history reviewed and updated as indicated. Interim medical history since our last visit  reviewed. Allergies and medications reviewed and updated.  Review of Systems  Constitutional:  Negative for activity change, appetite change, diaphoresis, fatigue and fever.  Respiratory:  Negative for cough, chest tightness, shortness of breath and wheezing.   Cardiovascular:  Negative for chest pain, palpitations and leg swelling.  Gastrointestinal:  Positive for abdominal pain, diarrhea, nausea and vomiting. Negative for abdominal distention, blood in stool, constipation and rectal pain.  Endocrine: Negative.   Neurological: Negative.   Psychiatric/Behavioral: Negative.  Negative for sleep disturbance and suicidal ideas.    Per HPI unless specifically indicated above     Objective:    BP 100/67    Pulse 77    Temp 98.3 F (36.8 C) (Oral)    Ht 5' 6.93" (1.7 m)    Wt 212 lb 9.6 oz (96.4 kg)    SpO2 92%    BMI 33.37 kg/m   Wt Readings from Last 3 Encounters:  05/10/21 212 lb 9.6 oz (96.4 kg)  04/20/21 215 lb (97.5 kg)  03/31/21 217 lb 9.6 oz (98.7 kg)    Physical Exam Vitals and nursing note reviewed.  Constitutional:      General: She is awake.     Appearance: She is well-developed and well-groomed. She is obese.  HENT:     Head: Normocephalic.     Right Ear: Hearing normal.     Left Ear: Hearing normal.  Eyes:  General: Lids are normal.        Right eye: No discharge.        Left eye: No discharge.     Conjunctiva/sclera: Conjunctivae normal.     Pupils: Pupils are equal, round, and reactive to light.  Neck:     Thyroid: No thyromegaly.     Vascular: No carotid bruit.  Cardiovascular:     Rate and Rhythm: Normal rate and regular rhythm.     Heart sounds: Normal heart sounds. No murmur heard.   No gallop.  Pulmonary:     Effort: Pulmonary effort is normal. No accessory muscle usage or respiratory distress.     Breath sounds: Decreased breath sounds and wheezing present. No rhonchi.     Comments: Decreased lung sounds per baseline with expiratory wheezes  throughout, no rhonchi noted.  No SOB with talking. Abdominal:     General: Bowel sounds are normal. There is no distension.     Palpations: Abdomen is soft. There is no hepatomegaly.     Tenderness: There is no abdominal tenderness.     Hernia: No hernia is present.  Musculoskeletal:     Cervical back: Normal range of motion and neck supple.     Right lower leg: No edema.     Left lower leg: No edema.  Skin:    General: Skin is warm and dry.     Comments: Scattered pale purple bruising bilateral upper extremities.  Neurological:     Mental Status: She is alert and oriented to person, place, and time.  Psychiatric:        Attention and Perception: Attention normal.        Mood and Affect: Mood normal.        Speech: Speech normal.        Behavior: Behavior normal. Behavior is cooperative.        Thought Content: Thought content normal.    Results for orders placed or performed in visit on 04/20/21  Stool Culture   Specimen: Stool   ST     CD- [id number]  Result Value Ref Range   Salmonella/Shigella Screen Final report    Stool Culture result 1 (RSASHR) Comment    Campylobacter Culture Final report    Stool Culture result 1 (CMPCXR) Comment    E coli, Shiga toxin Assay Negative Negative  Fecal occult blood, imunochemical   Specimen: Stool   ST     CD- [id number] V  Result Value Ref Range   Fecal Occult Bld Negative Negative  CBC with Differential/Platelet  Result Value Ref Range   WBC 10.1 3.4 - 10.8 x10E3/uL   RBC 4.85 3.77 - 5.28 x10E6/uL   Hemoglobin 14.3 11.1 - 15.9 g/dL   Hematocrit 42.4 34.0 - 46.6 %   MCV 87 79 - 97 fL   MCH 29.5 26.6 - 33.0 pg   MCHC 33.7 31.5 - 35.7 g/dL   RDW 12.9 11.7 - 15.4 %   Platelets 297 150 - 450 x10E3/uL   Neutrophils 63 Not Estab. %   Lymphs 29 Not Estab. %   Monocytes 5 Not Estab. %   Eos 1 Not Estab. %   Basos 1 Not Estab. %   Neutrophils Absolute 6.5 1.4 - 7.0 x10E3/uL   Lymphocytes Absolute 2.9 0.7 - 3.1 x10E3/uL   Monocytes  Absolute 0.5 0.1 - 0.9 x10E3/uL   EOS (ABSOLUTE) 0.1 0.0 - 0.4 x10E3/uL   Basophils Absolute 0.1 0.0 - 0.2 x10E3/uL   Immature Granulocytes 1  Not Estab. %   Immature Grans (Abs) 0.1 0.0 - 0.1 x10E3/uL  Comprehensive metabolic panel  Result Value Ref Range   Glucose 100 (H) 70 - 99 mg/dL   BUN 12 8 - 27 mg/dL   Creatinine, Ser 0.90 0.57 - 1.00 mg/dL   eGFR 72 >59 mL/min/1.73   BUN/Creatinine Ratio 13 12 - 28   Sodium 139 134 - 144 mmol/L   Potassium 4.4 3.5 - 5.2 mmol/L   Chloride 99 96 - 106 mmol/L   CO2 26 20 - 29 mmol/L   Calcium 9.0 8.7 - 10.3 mg/dL   Total Protein 6.2 6.0 - 8.5 g/dL   Albumin 4.5 3.8 - 4.8 g/dL   Globulin, Total 1.7 1.5 - 4.5 g/dL   Albumin/Globulin Ratio 2.6 (H) 1.2 - 2.2   Bilirubin Total 0.4 0.0 - 1.2 mg/dL   Alkaline Phosphatase 81 44 - 121 IU/L   AST 20 0 - 40 IU/L   ALT 21 0 - 32 IU/L  CBC With Differential/Platelet  Result Value Ref Range   WBC 10.6 3.4 - 10.8 x10E3/uL   RBC 4.83 3.77 - 5.28 x10E6/uL   Hemoglobin 14.7 11.1 - 15.9 g/dL   Hematocrit 41.8 34.0 - 46.6 %   MCV 87 79 - 97 fL   MCH 30.4 26.6 - 33.0 pg   MCHC 35.2 31.5 - 35.7 g/dL   RDW 13.6 11.7 - 15.4 %   Platelets 284 150 - 450 x10E3/uL   Neutrophils 67 Not Estab. %   Lymphs 28 Not Estab. %   MID 6 Not Estab. 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  Recommend gentle skin cleansing daily and application of lotion daily.  Monitor skin and notify provider if any abrasions or wounds.        Digestive   Nausea and vomiting    Acute and ongoing issue.  ?related to Ozempic, possibly she has some underlying gastroparesis which Ozempic has irritated.  Will stop Ozempic and send in Compazine to use as needed for nausea,  no relief from Zofran.  Labs today: CBC, CMP, TSH, Amylase, and Lipase.  GI visit upcoming.        Other   Diarrhea    Refer to N&V plan.      Relevant Orders   CBC with Differential/Platelet   Comprehensive metabolic panel   TSH   Lipase   Amylase   Other Visit Diagnoses     Encounter for screening mammogram for malignant neoplasm of breast       Ordered mammogram.   Relevant Orders   MM 3D SCREEN BREAST BILATERAL        Follow up plan: Return for as scheduled 06/29/21.

## 2021-05-10 NOTE — Telephone Encounter (Signed)
Dawn from pharmacy stated for Blood Glucose Monitoring Suppl (ACCU-CHEK GUIDE ME) w/Device KIT strips and lancets are needed and were not sent.   Pharmacy requesting they be sent as pt will need them.    [place], [address]  [place] [address]  Phone: [phone] Fax: [phone]  Hours: Not open 24 hours

## 2021-05-10 NOTE — Assessment & Plan Note (Addendum)
Acute and ongoing issue.  ?related to Ozempic, possibly she has some underlying gastroparesis which Ozempic has irritated.  Will stop Ozempic and send in Compazine to use as needed for nausea, no relief from Zofran.  Labs today: CBC, CMP, TSH, Amylase, and Lipase.  GI visit upcoming.

## 2021-05-10 NOTE — Assessment & Plan Note (Signed)
Bilateral upper extremity.  Recommend gentle skin cleansing daily and application of lotion daily.  Monitor skin and notify provider if any abrasions or wounds. 

## 2021-05-10 NOTE — Assessment & Plan Note (Signed)
Refer to N&V plan.

## 2021-05-10 NOTE — Telephone Encounter (Signed)
Copied from [place] [id number]. Topic: General - Other >> May 10, 2021 10:05 AM Valere Dross wrote: Reason for CRM: Dawn at [facility] stating medication prochlorperazine (COMPAZINE) 5 MG tablet, they do not carry, she was wondering if there was an alternate she would like, or if not they do carry that medication just in a 10MG , if that is something PCP wants to do, she stated she could just resend over the script. Please advise.

## 2021-05-11 LAB — CBC WITH DIFFERENTIAL/PLATELET
Basophils Absolute: 0.1 10*3/uL (ref 0.0–0.2)
Basos: 1 %
EOS (ABSOLUTE): 0.1 10*3/uL (ref 0.0–0.4)
Eos: 1 %
Hematocrit: 40.8 % (ref 34.0–46.6)
Hemoglobin: 13.4 g/dL (ref 11.1–15.9)
Immature Grans (Abs): 0 10*3/uL (ref 0.0–0.1)
Immature Granulocytes: 0 %
Lymphocytes Absolute: 2.7 10*3/uL (ref 0.7–3.1)
Lymphs: 28 %
MCH: 29.3 pg (ref 26.6–33.0)
MCHC: 32.8 g/dL (ref 31.5–35.7)
MCV: 89 fL (ref 79–97)
Monocytes Absolute: 0.5 10*3/uL (ref 0.1–0.9)
Monocytes: 5 %
Neutrophils Absolute: 6.6 10*3/uL (ref 1.4–7.0)
Neutrophils: 65 %
Platelets: 324 10*3/uL (ref 150–450)
RBC: 4.57 x10E6/uL (ref 3.77–5.28)
RDW: 13.1 % (ref 11.7–15.4)
WBC: 10 10*3/uL (ref 3.4–10.8)

## 2021-05-11 LAB — COMPREHENSIVE METABOLIC PANEL
ALT: 20 IU/L (ref 0–32)
AST: 18 IU/L (ref 0–40)
Albumin/Globulin Ratio: 2.3 — ABNORMAL HIGH (ref 1.2–2.2)
Albumin: 4.3 g/dL (ref 3.8–4.8)
Alkaline Phosphatase: 81 IU/L (ref 44–121)
BUN/Creatinine Ratio: 13 (ref 12–28)
BUN: 10 mg/dL (ref 8–27)
Bilirubin Total: 0.4 mg/dL (ref 0.0–1.2)
CO2: 23 mmol/L (ref 20–29)
Calcium: 9.5 mg/dL (ref 8.7–10.3)
Chloride: 100 mmol/L (ref 96–106)
Creatinine, Ser: 0.78 mg/dL (ref 0.57–1.00)
Globulin, Total: 1.9 g/dL (ref 1.5–4.5)
Glucose: 96 mg/dL (ref 70–99)
Potassium: 5 mmol/L (ref 3.5–5.2)
Sodium: 139 mmol/L (ref 134–144)
Total Protein: 6.2 g/dL (ref 6.0–8.5)
eGFR: 85 mL/min/{1.73_m2} (ref >59)

## 2021-05-11 LAB — AMYLASE: Amylase: 77 U/L (ref 31–110)

## 2021-05-11 LAB — TSH: TSH: 1.12 u[IU]/mL (ref 0.450–4.500)

## 2021-05-11 LAB — LIPASE: Lipase: 42 U/L (ref 14–72)

## 2021-05-11 MED ORDER — GLUCOSE BLOOD VI STRP: ORAL_STRIP | 12 refills | Status: DC

## 2021-05-11 MED ORDER — ACCU-CHEK GUIDE ME W/DEVICE KIT: PACK | 0 refills | Status: DC

## 2021-05-11 NOTE — Telephone Encounter (Signed)
Patient medication ordered and resent to local pharmacy per patient request.

## 2021-05-11 NOTE — Telephone Encounter (Signed)
Noted  

## 2021-05-11 NOTE — Progress Notes (Signed)
Contacted via [place] morning Stacey Gross, your labs have returned and remain nice and normal, including pancreas labs.  At this time I continue to recommend remaining off of Ozempic to see if this is causing more of this discomfort, as I suspect it is.  Any questions? Keep being amazing!!  Thank you for allowing me to participate in your care.  I appreciate you. Kindest regards, Jacori Mulrooney

## 2021-05-12 ENCOUNTER — Other Ambulatory Visit: Payer: Self-pay | Admitting: Family Medicine

## 2021-05-12 NOTE — Telephone Encounter (Signed)
Requested medication (s) are due for refill today:   Requested medication (s) are on the active medication list: No  Last refill:    Future visit scheduled: Yes  Notes to clinic:  Not on medication list.    Requested Prescriptions  Pending Prescriptions Disp Refills   Accu-Chek Softclix Lancets lancets [Pharmacy Med Name: [place] 100 each     Sig: USE TO [place]     Endocrinology: Diabetes - Testing Supplies Passed - 05/12/2021  9:34 AM      Passed - Valid encounter within last 12 months    Recent Outpatient Visits           2 days ago Senile purpura ([place])   [place] Cannady, Jolene T, NP   3 weeks ago Nausea and vomiting, unspecified vomiting type   [facility] Vigg, Avanti, MD   1 month ago Type 2 diabetes, controlled, with peripheral circulatory disorder ([place])   [place] Cannady, Jolene T, NP   3 months ago Acute cough   [place], Lauren A, NP   8 months ago Centrilobular emphysema ([place])   [place], Barbaraann Faster, NP       Future Appointments             In 5 days  [facility]   In 1 week Lucilla Lame, MD [place]   In 1 month Cannady, Barbaraann Faster, NP [facility]

## 2021-05-17 ENCOUNTER — Ambulatory Visit (INDEPENDENT_AMBULATORY_CARE_PROVIDER_SITE_OTHER): Payer: Medicare Other | Admitting: *Deleted

## 2021-05-17 DIAGNOSIS — Z0000 Encounter for general adult medical examination without abnormal findings: Secondary | ICD-10-CM

## 2021-05-17 NOTE — Patient Instructions (Signed)
Stacey Gross , Thank you for taking time to come for your Medicare Wellness Visit. I appreciate your ongoing commitment to your health goals. Please review the following plan we discussed and let me know if I can assist you in the future.   Screening recommendations/referrals: Colonoscopy: up to date   Mammogram: Education provided Bone Density: Education provided Recommended yearly ophthalmology/optometry visit for glaucoma screening and checkup Recommended yearly dental visit for hygiene and checkup  Vaccinations: Influenza vaccine: Education provided Pneumococcal vaccine: Education provided Tdap vaccine: up to date Shingles vaccine: up to date    Advanced directives: Education provided  Conditions/risks identified:   Next appointment: 3.28.2023 @ 8:20 [place]   Preventive Care 20 Years and Older, Female Preventive care refers to lifestyle choices and visits with your health care provider that can promote health and wellness. What does preventive care include? A yearly physical exam. This is also called an annual well check. Dental exams once or twice a year. Routine eye exams. Ask your health care provider how often you should have your eyes checked. Personal lifestyle choices, including: Daily care of your teeth and gums. Regular physical activity. Eating a healthy diet. Avoiding tobacco and drug use. Limiting alcohol use. Practicing safe sex. Taking low-dose aspirin every day. Taking vitamin and mineral supplements as recommended by your health care provider. What happens during an annual well check? The services and screenings done by your health care provider during your annual well check will depend on your age, overall health, lifestyle risk factors, and family history of disease. Counseling  Your health care provider may ask you questions about your: Alcohol use. Tobacco use. Drug use. Emotional well-being. Home and relationship well-being. Sexual  activity. Eating habits. History of falls. Memory and ability to understand (cognition). Work and work Statistician. Reproductive health. Screening  You may have the following tests or measurements: Height, weight, and BMI. Blood pressure. Lipid and cholesterol levels. These may be checked every 5 years, or more frequently if you are over 36 years old. Skin check. Lung cancer screening. You may have this screening every year starting at age 58 if you have a 30-pack-year history of smoking and currently smoke or have quit within the past 15 years. Fecal occult blood test (FOBT) of the stool. You may have this test every year starting at age 29. Flexible sigmoidoscopy or colonoscopy. You may have a sigmoidoscopy every 5 years or a colonoscopy every 10 years starting at age 19. Hepatitis C blood test. Hepatitis B blood test. Sexually transmitted disease (STD) testing. Diabetes screening. This is done by checking your blood sugar (glucose) after you have not eaten for a while (fasting). You may have this done every 1-3 years. Bone density scan. This is done to screen for osteoporosis. You may have this done starting at age 63. Mammogram. This may be done every 1-2 years. Talk to your health care provider about how often you should have regular mammograms. Talk with your health care provider about your test results, treatment options, and if necessary, the need for more tests. Vaccines  Your health care provider may recommend certain vaccines, such as: Influenza vaccine. This is recommended every year. Tetanus, diphtheria, and acellular pertussis (Tdap, Td) vaccine. You may need a Td booster every 10 years. Zoster vaccine. You may need this after age 20. Pneumococcal 13-valent conjugate (PCV13) vaccine. One dose is recommended after age 25. Pneumococcal polysaccharide (PPSV23) vaccine. One dose is recommended after age 60. Talk to your health care provider about  which screenings and vaccines  you need and how often you need them. This information is not intended to replace advice given to you by your health care provider. Make sure you discuss any questions you have with your health care provider. Document Released: 04/17/2015 Document Revised: 12/09/2015 Document Reviewed: 01/20/2015 Elsevier Interactive Patient Education  2017 [place] Prevention in the Home Falls can cause injuries. They can happen to people of all ages. There are many things you can do to make your home safe and to help prevent falls. What can I do on the outside of my home? Regularly fix the edges of walkways and driveways and fix any cracks. Remove anything that might make you trip as you walk through a door, such as a raised step or threshold. Trim any bushes or trees on the path to your home. Use bright outdoor lighting. Clear any walking paths of anything that might make someone trip, such as rocks or tools. Regularly check to see if handrails are loose or broken. Make sure that both sides of any steps have handrails. Any raised decks and porches should have guardrails on the edges. Have any leaves, snow, or ice cleared regularly. Use sand or salt on walking paths during winter. Clean up any spills in your garage right away. This includes oil or grease spills. What can I do in the bathroom? Use night lights. Install grab bars by the toilet and in the tub and shower. Do not use towel bars as grab bars. Use non-skid mats or decals in the tub or shower. If you need to sit down in the shower, use a plastic, non-slip stool. Keep the floor dry. Clean up any water that spills on the floor as soon as it happens. Remove soap buildup in the tub or shower regularly. Attach bath mats securely with double-sided non-slip rug tape. Do not have throw rugs and other things on the floor that can make you trip. What can I do in the bedroom? Use night lights. Make sure that you have a light by your bed that  is easy to reach. Do not use any sheets or blankets that are too big for your bed. They should not hang down onto the floor. Have a firm chair that has side arms. You can use this for support while you get dressed. Do not have throw rugs and other things on the floor that can make you trip. What can I do in the kitchen? Clean up any spills right away. Avoid walking on wet floors. Keep items that you use a lot in easy-to-reach places. If you need to reach something above you, use a strong step stool that has a grab bar. Keep electrical cords out of the way. Do not use floor polish or wax that makes floors slippery. If you must use wax, use non-skid floor wax. Do not have throw rugs and other things on the floor that can make you trip. What can I do with my stairs? Do not leave any items on the stairs. Make sure that there are handrails on both sides of the stairs and use them. Fix handrails that are broken or loose. Make sure that handrails are as long as the stairways. Check any carpeting to make sure that it is firmly attached to the stairs. Fix any carpet that is loose or worn. Avoid having throw rugs at the top or bottom of the stairs. If you do have throw rugs, attach them to the floor with  carpet tape. Make sure that you have a light switch at the top of the stairs and the bottom of the stairs. If you do not have them, ask someone to add them for you. What else can I do to help prevent falls? Wear shoes that: Do not have high heels. Have rubber bottoms. Are comfortable and fit you well. Are closed at the toe. Do not wear sandals. If you use a stepladder: Make sure that it is fully opened. Do not climb a closed stepladder. Make sure that both sides of the stepladder are locked into place. Ask someone to hold it for you, if possible. Clearly mark and make sure that you can see: Any grab bars or handrails. First and last steps. Where the edge of each step is. Use tools that help you  move around (mobility aids) if they are needed. These include: Canes. Walkers. Scooters. Crutches. Turn on the lights when you go into a dark area. Replace any light bulbs as soon as they burn out. Set up your furniture so you have a clear path. Avoid moving your furniture around. If any of your floors are uneven, fix them. If there are any pets around you, be aware of where they are. Review your medicines with your doctor. Some medicines can make you feel dizzy. This can increase your chance of falling. Ask your doctor what other things that you can do to help prevent falls. This information is not intended to replace advice given to you by your health care provider. Make sure you discuss any questions you have with your health care provider. Document Released: 01/15/2009 Document Revised: 08/27/2015 Document Reviewed: 04/25/2014 Elsevier Interactive Patient Education  2017 [employer].

## 2021-05-17 NOTE — Progress Notes (Signed)
Subjective:   Stacey Gross is a 61 y.o. female who presents for Medicare Annual (Subsequent) preventive examination.  I connected with  Stacey Gross on 05/17/21 by a  telephone enabled telemedicine application and verified that I am speaking with the correct person using two identifiers.   I discussed the limitations of evaluation and management by telemedicine. The patient expressed understanding and agreed to proceed.  Patient location: home  Provider location: in office  I provided  minutes of non face - to - face time during this encounter.   Review of Systems     Cardiac Risk Factors include: advanced age (>51mn, >>22women);diabetes mellitus;hypertension;smoking/ tobacco exposure;sedentary lifestyle;obesity (BMI >30kg/m2)     Objective:    Today's Vitals   There is no height or weight on file to calculate BMI.  Advanced Directives 05/17/2021 07/27/2020 07/25/2020 07/25/2020 05/15/2020 07/24/2019 05/15/2019  Does Patient Have a Medical Advance Directive? Yes Yes - Yes Yes No Yes  Type of AParamedicof AReynoldsLiving will - - HAir Force AcademyLiving will - Living will;Healthcare Power of Attorney  Does patient want to make changes to medical advance directive? - No - Patient declined No - Patient declined Yes (ED - Information included in AVS) - - -  Copy of HStellain Chart? No - copy requested No - copy requested - - No - copy requested - No - copy requested  Would patient like information on creating a medical advance directive? - - - - - No - Patient declined -    Current Medications (verified) Outpatient Encounter Medications as of 05/17/2021  Medication Sig   Accu-Chek Softclix Lancets lancets USE TO CHECK BLOOD SUGAR DAILY   albuterol (PROVENTIL) (2.5 MG/3ML) 0.083% nebulizer solution USE 1 VIAL (3 ML = 2.5 MG TOTAL) BY NEBULIZATION EVERY 6 HOURS AS NEEDED FORWHEEZING OR SHORTNESS  OF BREATH   albuterol (VENTOLIN HFA) 108 (90 Base) MCG/ACT inhaler INHALE 2 PUFFS BY MOUTH INTO THE LUNGS EVERY 6 HOURS AS NEEDED FOR WHEEZING OR SHORTNESS OF BREATH   Blood Glucose Monitoring Suppl (ACCU-CHEK GUIDE ME) w/Device KIT Use to check blood sugars 2-3 times daily with goals = <130 fasting in morning and <180 two hours after eating.  Bring blood sugar log to appointments.   Budeson-Glycopyrrol-Formoterol (BREZTRI AEROSPHERE) 160-9-4.8 MCG/ACT AERO Inhale 2 puffs into the lungs in the morning and at bedtime.   Cholecalciferol 1.25 MG (50000 UT) TABS Take 1 tablet by mouth once a week.   citalopram (CELEXA) 20 MG tablet Take 1 tablet (20 mg total) by mouth daily.   glucose blood test strip Use to check blood sugar three times a day.   hydrOXYzine (ATARAX) 25 MG tablet TAKE 1 TO 2 TABLETS BY MOUTH EVERY 4 TO 6 HOURS AS NEEDED FOR ITCHING. MAY CAUSEDROWSINESS.   lactobacillus acidophilus & bulgar (LACTINEX) chewable tablet Chew 1 tablet by mouth 3 (three) times daily as needed (take after every stools).   meloxicam (MOBIC) 15 MG tablet TAKE 1 TABLET BY MOUTH DAILY   metFORMIN (GLUCOPHAGE) 500 MG tablet Take 2 tablets (1,000 mg total) by mouth 2 (two) times daily with a meal.   olmesartan (BENICAR) 5 MG tablet Take 1 tablet (5 mg total) by mouth daily.   pantoprazole (PROTONIX) 40 MG tablet Take 1 tablet (40 mg total) by mouth daily.   prochlorperazine (COMPAZINE) 10 MG/2ML injection Inject 2 mLs (10 mg total) into the vein every 6 (  six) hours as needed.   rosuvastatin (CRESTOR) 20 MG tablet Take 1 tablet (20 mg total) by mouth daily.   verapamil (CALAN-SR) 120 MG CR tablet Take 1 tablet (120 mg total) by mouth at bedtime.   vitamin B-12 (CYANOCOBALAMIN) 1000 MCG tablet Take 1 tablet (1,000 mcg total) by mouth daily.   No facility-administered encounter medications on file as of 05/17/2021.    Allergies (verified) Doxycycline hyclate, Acetaminophen, Aspirin, Latex, and Vancomycin    History: Past Medical History:  Diagnosis Date   Asthma    Cancer ([place])    stomach   Chronic back pain    COPD (chronic obstructive pulmonary disease) (HCC)    Degenerative joint disease (DJD) of hip    Diabetes mellitus without complication (HCC)    Dyspnea    GERD (gastroesophageal reflux disease)    takes Omeprazole daily   History of bronchitis yrs ago   History of colon polyps    benign   History of staph infection 10 yrs ago   Hyperlipidemia    takes Atorvastatin daily   Hypertension    Peripheral vascular disease (HCC)    Tobacco use    8 cigarettes daily    Weakness    numbness and tingling in both legs    Past Surgical History:  Procedure Laterality Date   APPENDECTOMY     BACK SURGERY  9-10 yrs ago   BREAST BIOPSY Right    Core- neg   COLONOSCOPY     COLONOSCOPY WITH PROPOFOL N/A 09/20/2017   Procedure: COLONOSCOPY WITH PROPOFOL;  Surgeon: Virgel Manifold, MD;  Location: [facility] ENDOSCOPY;  Service: Endoscopy;  Laterality: N/A;   COLONOSCOPY WITH PROPOFOL N/A 04/24/2018   Procedure: COLONOSCOPY WITH PROPOFOL;  Surgeon: Lucilla Lame, MD;  Location: [facility] ENDOSCOPY;  Service: Endoscopy;  Laterality: N/A;   ESOPHAGOGASTRODUODENOSCOPY (EGD) WITH PROPOFOL N/A 09/20/2017   Procedure: ESOPHAGOGASTRODUODENOSCOPY (EGD) WITH PROPOFOL;  Surgeon: Virgel Manifold, MD;  Location: [facility] ENDOSCOPY;  Service: Endoscopy;  Laterality: N/A;   ESOPHAGOGASTRODUODENOSCOPY (EGD) WITH PROPOFOL N/A 11/20/2018   Procedure: ESOPHAGOGASTRODUODENOSCOPY (EGD) WITH PROPOFOL;  Surgeon: Lucilla Lame, MD;  Location: [facility] ENDOSCOPY;  Service: Endoscopy;  Laterality: N/A;   PERIPHERAL VASCULAR CATHETERIZATION Left 03/17/2016   Procedure: Lower Extremity Angiography;  Surgeon: Algernon Huxley, MD;  Location: [facility];  Service: Cardiovascular;  Laterality: Left;   TOTAL ABDOMINAL HYSTERECTOMY W/ BILATERAL SALPINGOOPHORECTOMY     total   TOTAL SHOULDER ARTHROPLASTY Right 07/24/2019    Procedure: RIGHT TOTAL SHOULDER ARTHROPLASTY;  Surgeon: Lovell Sheehan, MD;  Location: [facility] ORS;  Service: Orthopedics;  Laterality: Right;   Family History  Problem Relation Age of Onset   Hyperlipidemia Mother    Hyperlipidemia Father    Melanoma Sister    Breast cancer Neg Hx    Social History   Socioeconomic History   Marital status: Widowed    Spouse name: Not on file   Number of children: 0   Years of education: 8   Highest education level: Not on file  Occupational History   Occupation: disabled  Tobacco Use   Smoking status: Every Day    Packs/day: 1.50    Years: 48.00    Pack years: 72.00    Types: Cigarettes   Smokeless tobacco: Never  Vaping Use   Vaping Use: Never used  Substance and Sexual Activity   Alcohol use: No   Drug use: No   Sexual activity: Not on file  Other Topics Concern  Not on file  Social History Narrative   Not on file   Social Determinants of Health   Financial Resource Strain: Low Risk    Difficulty of Paying Living Expenses: Not hard at all  Food Insecurity: No Food Insecurity   Worried About Charity fundraiser in the Last Year: Never true   Arboriculturist in the Last Year: Never true  Transportation Needs: No Transportation Needs   Lack of Transportation (Medical): No   Lack of Transportation (Non-Medical): No  Physical Activity: Inactive   Days of Exercise per Week: 0 days   Minutes of Exercise per Session: 0 min  Stress: No Stress Concern Present   Feeling of Stress : Not at all  Social Connections: Socially Isolated   Frequency of Communication with Friends and Family: Once a week   Frequency of Social Gatherings with Friends and Family: More than three times a week   Attends Religious Services: Never   Marine scientist or Organizations: No   Attends Music therapist: Never   Marital Status: Divorced    Tobacco Counseling Ready to quit: Not Answered Counseling given: Not Answered   Clinical  Intake:  Pre-visit preparation completed: Yes  Pain : No/denies pain   Nutrition Risk Assessment:  Has the patient had any N/V/D within the last 2 months?  No  Does the patient have any non-healing wounds?  No  Has the patient had any unintentional weight loss or weight gain?  No   Diabetes:  Is the patient diabetic?  Yes  If diabetic, was a CBG obtained today?  No  Did the patient bring in their glucometer from home?  No  How often do you monitor your CBG's?  2 x week.   Financial Strains and Diabetes Management:  Are you having any financial strains with the device, your supplies or your medication? No .  Does the patient want to be seen by Chronic Care Management for management of their diabetes?  No  Would the patient like to be referred to a Nutritionist or for Diabetic Management?  No   Diabetic Exams:  Diabetic Eye Exam: Completed for Pt has been advised about the importance in completing this exam.   Diabetic Foot Exam:. Pt has been advised about the importance in completing this exam.  Nutritional Risks: None Diabetes: Yes CBG done?: No Did pt. bring in CBG monitor from home?: No  How often do you need to have someone help you when you read instructions, pamphlets, or other written materials from your doctor or pharmacy?: 1 - Never  Diabetic?  yes  Interpreter Needed?: No  Information entered by :: Leroy Kennedy LPN   Activities of Daily Living In your present state of health, do you have any difficulty performing the following activities: 05/17/2021 07/25/2020  Hearing? N N  Vision? N N  Difficulty concentrating or making decisions? N N  Walking or climbing stairs? N Y  Dressing or bathing? N N  Doing errands, shopping? N N  Preparing Food and eating ? N -  Using the Toilet? N -  In the past six months, have you accidently leaked urine? N -  Do you have problems with loss of bowel control? N -  Managing your Medications? N -  Managing your Finances? N -   Housekeeping or managing your Housekeeping? N -  Some recent data might be hidden    Patient Care Team: Venita Lick, NP as PCP -  General (Nurse Practitioner) Kate Sable, MD as PCP - Cardiology (Cardiology) Guadalupe Maple, MD (Family Medicine)  Indicate any recent Medical Services you may have received from other than [facility] providers in the past year (date may be approximate).     Assessment:   This is a routine wellness examination for Stacey Gross.  Hearing/Vision screen Hearing Screening - Comments:: No trouble hearing Vision Screening - Comments:: Up to date [facility]  Dietary issues and exercise activities discussed: Current Exercise Habits: The patient does not participate in regular exercise at present, Exercise limited by: None identified   Goals Addressed             This Visit's Progress    Quit Smoking         Depression Screen PHQ 2/9 Scores 05/17/2021 05/10/2021 04/20/2021 03/31/2021 05/15/2020 05/13/2020 01/07/2020  PHQ - 2 Score 0 - - 0 0 3 0  PHQ- 9 Score 0 - - 1 - 14 4  Exception Documentation - Patient refusal Patient refusal Patient refusal - - -    Fall Risk Fall Risk  05/17/2021 04/20/2021 05/15/2020 05/13/2020 01/07/2020  Falls in the past year? 0 0 1 0 0  Number falls in past yr: 0 0 - 0 0  Injury with Fall? 0 0 - - 0  Risk for fall due to : - No Fall Risks Other (Comment);Medication side effect - No Fall Risks  Risk for fall due to: Comment - - - - -  Follow up Falls evaluation completed;Falls prevention discussed Falls evaluation completed Falls evaluation completed;Education provided;Falls prevention discussed - Falls evaluation completed    FALL RISK PREVENTION PERTAINING TO THE HOME:  Any stairs in or around the home? No  If so, are there any without handrails? No  Home free of loose throw rugs in walkways, pet beds, electrical cords, etc? Yes  Adequate lighting in your home to reduce risk of falls? Yes   ASSISTIVE DEVICES UTILIZED TO  PREVENT FALLS:  Life alert? No  Use of a cane, walker or w/c? No  Grab bars in the bathroom? No  Shower chair or bench in shower? Yes  Elevated toilet seat or a handicapped toilet? Yes   TIMED UP AND GO:  Was the test performed? No .    Cognitive Function:  Normal cognitive status assessed by direct observation by this Nurse Health Advisor. No abnormalities found.       6CIT Screen 05/15/2020 02/01/2018 01/25/2017  What Year? 0 points 0 points 0 points  What month? 0 points 0 points 0 points  What time? 0 points 0 points 0 points  Count back from 20 0 points 0 points 0 points  Months in reverse 4 points 0 points 0 points  Repeat phrase 6 points 0 points 0 points  Total Score 10 0 0    Immunizations Immunization History  Administered Date(s) Administered   Influenza,inj,Quad PF,6+ Mos 02/09/2016, 02/22/2017, 05/09/2018, 12/17/2018, 01/07/2020   Pneumococcal Polysaccharide-23 02/09/2016   Td 04/04/2004, 05/13/2014   Zoster Recombinat (Shingrix) 01/07/2019, 04/22/2019    TDAP status: Up to date  Flu Vaccine status: Due, Education has been provided regarding the importance of this vaccine. Advised may receive this vaccine at local pharmacy or Health Dept. Aware to provide a copy of the vaccination record if obtained from local pharmacy or Health Dept. Verbalized acceptance and understanding.  Pneumococcal vaccine status: Due, Education has been provided regarding the importance of this vaccine. Advised may receive this vaccine at local pharmacy or Health  Dept. Aware to provide a copy of the vaccination record if obtained from local pharmacy or Health Dept. Verbalized acceptance and understanding.  Covid-19 vaccine status: Declined, Education has been provided regarding the importance of this vaccine but patient still declined. Advised may receive this vaccine at local pharmacy or Health Dept.or vaccine clinic. Aware to provide a copy of the vaccination record if obtained from  local pharmacy or Health Dept. Verbalized acceptance and understanding.  Qualifies for Shingles Vaccine? No   Zostavax completed No   Shingrix Completed?: Yes  Screening Tests Health Maintenance  Topic Date Due   MAMMOGRAM  03/26/2020   COLONOSCOPY (Pts 45-61yr Insurance coverage will need to be confirmed)  04/24/2021   FOOT EXAM  05/13/2021   OPHTHALMOLOGY EXAM  06/10/2021   HEMOGLOBIN A1C  09/29/2021   TETANUS/TDAP  05/13/2024   Hepatitis C Screening  Completed   HIV Screening  Completed   Zoster Vaccines- Shingrix  Completed   HPV VACCINES  Aged Out   INFLUENZA VACCINE  Discontinued   COVID-19 Vaccine  Discontinued    Health Maintenance  Health Maintenance Due  Topic Date Due   MAMMOGRAM  03/26/2020   COLONOSCOPY (Pts 45-424yrInsurance coverage will need to be confirmed)  04/24/2021   FOOT EXAM  05/13/2021    Colorectal cancer screening: Type of screening: Colonoscopy. Completed 2020. Repeat every 3  patient states she has an appointment with GaGertie FeyD years  Mammogram status: Ordered  . Pt provided with contact info and advised to call to schedule appt.   Bone Density status: Completed 2015. Results reflect: Bone density results: NORMAL. Repeat every   years.  Lung Cancer Screening: (Low Dose CT Chest recommended if Age 63-80ears, 30 pack-year currently smoking OR have quit w/in 15years.) does qualify.   Lung Cancer Screening Referral: patient states she is aware and is going to schedule spoke to MD   Additional Screening:  Hepatitis C Screening: does not qualify; Completed 2020  Vision Screening: Recommended annual ophthalmology exams for early detection of glaucoma and other disorders of the eye. Is the patient up to date with their annual eye exam?  Yes  Who is the provider or what is the name of the office in which the patient attends annual eye exams? [facility] If pt is not established with a provider, would they like to be referred to a provider to  establish care? No .   Dental Screening: Recommended annual dental exams for proper oral hygiene  Community Resource Referral / Chronic Care Management: CRR required this visit?  No   CCM required this visit?  No      Plan:     I have personally reviewed and noted the following in the patients chart:   Medical and social history Use of alcohol, tobacco or illicit drugs  Current medications and supplements including opioid prescriptions.  Functional ability and status Nutritional status Physical activity Advanced directives List of other physicians Hospitalizations, surgeries, and ER visits in previous 12 months Vitals Screenings to include cognitive, depression, and falls Referrals and appointments  In addition, I have reviewed and discussed with patient certain preventive protocols, quality metrics, and best practice recommendations. A written personalized care plan for preventive services as well as general preventive health recommendations were provided to patient.     JuLeroy KennedyLPN   05/13/60/8366 Nurse Notes:

## 2021-05-19 ENCOUNTER — Ambulatory Visit: Payer: Self-pay | Admitting: *Deleted

## 2021-05-19 MED ORDER — AMOXICILLIN-POT CLAVULANATE 875-125 MG PO TABS: 1.0000 | ORAL_TABLET | Freq: Two times a day (BID) | ORAL | 0 refills | Status: AC

## 2021-05-19 NOTE — Telephone Encounter (Signed)
Patient notified of Stacey Gross's message and verbalized understanding.  ?

## 2021-05-19 NOTE — Addendum Note (Signed)
Addended by: Marnee Guarneri T on: 05/19/2021 03:47 PM   Modules accepted: Orders

## 2021-05-19 NOTE — Telephone Encounter (Signed)
° ° °  Summary: Antibotic/ ongoing ear drains   Fu with pt called in wanting a refill of antibiotic that Jolene gives her as she has a hole in her ear that cannot be fixed and it is draining again and needs the antibiotic. She was seen for in office re on 2/6. She does not know name of med and was very adamant that with her condition this was standard. Wanted Jolene's nurse just tell Jolene and states ongoing refill. She is told just to call when draining? --[phone]      Chief Complaint: medication request Symptoms: patient is feeling fluid sensation in ear- no drainage yet, sore throat Frequency: discomfort for 1 week Pertinent Negatives: Patient denies fever, head ache, pain Disposition: [] ED /[] Urgent Care (no appt availability in office) / [] Appointment(In office/virtual)/ []  [facility]/ [] Home Care/ [x] Refused Recommended Disposition /[] [facility]/ []  Follow-up with PCP Additional Notes: Patient states she has hx L ear perforation- unable to repair. Patient states she frequently gets ear infection with drainage. She feels this is coming due to symptoms and is requesting antibiotic. Patient advised she will need to be seen- patient request medication request be sent to PCP for her chronic problem   Reason for Disposition  Ear pain  Answer Assessment - Initial Assessment Questions 1. LOCATION: "Which ear is involved?"      Left ear - feels fluid in ear 2. COLOR: "What is the color of the discharge?"      No discharge 3. CONSISTENCY: "How runny is the discharge? Could it be water?"      *No Answer* 4. ONSET: "When did you first notice the discharge?"     Discomfort for 1 week- sore throat present 5. PAIN: "Is there any earache?" "How bad is it?"  (Scale 1-10; or mild, moderate, severe)     Some sharp pains 6. OBJECTS: "Have you put anything in your ear?" (e.g., Q-tip, other object)      *No Answer* 7. OTHER SYMPTOMS: "Do you have any other symptoms?" (e.g.,  headache, fever, dizziness, vomiting, runny nose)     Sore throat 8. PREGNANCY: "Is there any chance you are pregnant?" "When was your last menstrual period?"     *No Answer*  Protocols used: Ear - [place]

## 2021-05-23 NOTE — Progress Notes (Signed)
Primary Care Physician: Venita Lick, NP  Primary Gastroenterologist:  Dr. Lucilla Lame  Chief Complaint  Patient presents with   Dysphagia    HPI: Stacey Gross is a 61 y.o. female here after seeing me in the past for dysphagia.  The patient saw me back in June 2020 for dysphagia and had an upper endoscopy in August with dilation of the esophagus although it looked normal.  The patient had seen her primary care provider and was reporting issues back in January of this year. At that time the patient was reporting that she was having emesis associated with abdominal pain and diarrhea.  The diarrhea at that time had been going on for a month and was described as watery.  There is also associated vomiting with the diarrhea she had also reported that there was epigastric pain after she eats and she reported that food " hangs out there ". She was on Ozempic and that was stopped when she got the diarrhea but still has the diarrhea. She has lost 10 lbs in 2 weeks because of the swallowing problems. She says the stools are black.  The patient reports that she has more problems with solid food and liquids although liquid sometimes hanging up on her.  The patient also reports that she was good for about 2 years after the last upper endoscopy.  Past Medical History:  Diagnosis Date   Asthma    Cancer ([facility])    stomach   Chronic back pain    COPD (chronic obstructive pulmonary disease) (HCC)    Degenerative joint disease (DJD) of hip    Diabetes mellitus without complication (HCC)    Dyspnea    GERD (gastroesophageal reflux disease)    takes Omeprazole daily   History of bronchitis yrs ago   History of colon polyps    benign   History of staph infection 10 yrs ago   Hyperlipidemia    takes Atorvastatin daily   Hypertension    Peripheral vascular disease (HCC)    Tobacco use    8 cigarettes daily    Weakness    numbness and tingling in both legs     Current Outpatient Medications   Medication Sig Dispense Refill   Accu-Chek Softclix Lancets lancets USE TO CHECK BLOOD SUGAR DAILY 100 each 4   albuterol (PROVENTIL) (2.5 MG/3ML) 0.083% nebulizer solution USE 1 VIAL (3 ML = 2.5 MG TOTAL) BY NEBULIZATION EVERY 6 HOURS AS NEEDED FORWHEEZING OR SHORTNESS OF BREATH 180 mL 4   albuterol (VENTOLIN HFA) 108 (90 Base) MCG/ACT inhaler INHALE 2 PUFFS BY MOUTH INTO THE LUNGS EVERY 6 HOURS AS NEEDED FOR WHEEZING OR SHORTNESS OF BREATH 18 g 5   amoxicillin-clavulanate (AUGMENTIN) 875-125 MG tablet Take 1 tablet by mouth 2 (two) times daily for 7 days. 14 tablet 0   Blood Glucose Monitoring Suppl (ACCU-CHEK GUIDE ME) w/Device KIT Use to check blood sugars 2-3 times daily with goals = <130 fasting in morning and <180 two hours after eating.  Bring blood sugar log to appointments. 1 kit 0   Budeson-Glycopyrrol-Formoterol (BREZTRI AEROSPHERE) 160-9-4.8 MCG/ACT AERO Inhale 2 puffs into the lungs in the morning and at bedtime. 5.9 g 0   Cholecalciferol 1.25 MG (50000 UT) TABS Take 1 tablet by mouth once a week. 12 tablet 4   citalopram (CELEXA) 20 MG tablet Take 1 tablet (20 mg total) by mouth daily. 30 tablet 3   glucose blood test strip Use to check blood  sugar three times a day. 200 each 12   hydrOXYzine (ATARAX) 25 MG tablet TAKE 1 TO 2 TABLETS BY MOUTH EVERY 4 TO 6 HOURS AS NEEDED FOR ITCHING. MAY CAUSEDROWSINESS. 180 tablet 4   lactobacillus acidophilus & bulgar (LACTINEX) chewable tablet Chew 1 tablet by mouth 3 (three) times daily as needed (take after every stools). 30 tablet 1   meloxicam (MOBIC) 15 MG tablet TAKE 1 TABLET BY MOUTH DAILY 30 tablet 3   metFORMIN (GLUCOPHAGE) 500 MG tablet Take 2 tablets (1,000 mg total) by mouth 2 (two) times daily with a meal. 360 tablet 4   olmesartan (BENICAR) 5 MG tablet Take 1 tablet (5 mg total) by mouth daily. 90 tablet 4   pantoprazole (PROTONIX) 40 MG tablet Take 1 tablet (40 mg total) by mouth daily. 90 tablet 4   prochlorperazine (COMPAZINE) 10  MG/2ML injection Inject 2 mLs (10 mg total) into the vein every 6 (six) hours as needed. 30 mL 1   rosuvastatin (CRESTOR) 20 MG tablet Take 1 tablet (20 mg total) by mouth daily. 90 tablet 4   verapamil (CALAN-SR) 120 MG CR tablet Take 1 tablet (120 mg total) by mouth at bedtime. 90 tablet 4   vitamin B-12 (CYANOCOBALAMIN) 1000 MCG tablet Take 1 tablet (1,000 mcg total) by mouth daily. 90 tablet 2   No current facility-administered medications for this visit.    Allergies as of 05/24/2021 - Review Complete 05/17/2021  Allergen Reaction Noted   Doxycycline hyclate Anaphylaxis, Swelling, and Rash    Acetaminophen  01/07/2020   Aspirin  01/09/2020   Latex Rash 09/18/2013   Vancomycin Rash     ROS:  General: Negative for anorexia, weight loss, fever, chills, fatigue, weakness. ENT: Negative for hoarseness, difficulty swallowing , nasal congestion. CV: Negative for chest pain, angina, palpitations, dyspnea on exertion, peripheral edema.  Respiratory: Negative for dyspnea at rest, dyspnea on exertion, cough, sputum, wheezing.  GI: See history of present illness. GU:  Negative for dysuria, hematuria, urinary incontinence, urinary frequency, nocturnal urination.  Endo: Negative for unusual weight change.    Physical Examination:   BP (!) 156/73    Pulse 83    Temp 97.9 F (36.6 C) (Oral)    Ht _0  (1.702 m)    Wt 210 lb (95.3 kg)    BMI 32.89 kg/m   General: Well-nourished, well-developed in no acute distress.  Eyes: No icterus. Conjunctivae pink. Lungs: Clear to auscultation bilaterally. Non-labored. Heart: Regular rate and rhythm, no murmurs rubs or gallops.  Abdomen: Bowel sounds are normal, nontender, nondistended, no hepatosplenomegaly or masses, no abdominal bruits or hernia , no rebound or guarding.   Extremities: No lower extremity edema. No clubbing or deformities. Neuro: Alert and oriented x 3.  Grossly intact. Skin: Warm and dry, no jaundice.   Psych: Alert and  cooperative, normal mood and affect.  Labs:    Imaging Studies: CT ABDOMEN PELVIS W CONTRAST  Result Date: 04/28/2021 CLINICAL DATA:  Diarrhea EXAM: CT ABDOMEN AND PELVIS WITH CONTRAST TECHNIQUE: Multidetector CT imaging of the abdomen and pelvis was performed using the standard protocol following bolus administration of intravenous contrast. RADIATION DOSE REDUCTION: This exam was performed according to the departmental dose-optimization program which includes automated exposure control, adjustment of the mA and/or kV according to patient size and/or use of iterative reconstruction technique. CONTRAST:  158m OMNIPAQUE IOHEXOL 300 MG/ML  SOLN COMPARISON:  08/22/2017 FINDINGS: Lower chest: No acute abnormality. Hepatobiliary: Diffuse low-density throughout the liver compatible with  fatty infiltration. No focal abnormality. Gallbladder unremarkable. Pancreas: No focal abnormality or ductal dilatation. Spleen: No focal abnormality.  Normal size. Adrenals/Urinary Tract: Small cyst in the lower pole of the right kidney. No hydronephrosis. Adrenal glands and urinary bladder unremarkable. Stomach/Bowel: Scattered colonic diverticulosis. No active diverticulitis. Stomach and small bowel unremarkable. No bowel obstruction. Vascular/Lymphatic: Scattered aortic atherosclerosis. No evidence of aneurysm or adenopathy. Reproductive: Prior hysterectomy.  No adnexal masses. Other: No free fluid or free air. Musculoskeletal: Postoperative changes in the lower lumbar spine. No acute bony abnormality. IMPRESSION: Hepatic steatosis. Colonic diverticulosis. Aortic atherosclerosis. No acute findings. Electronically Signed   By: Rolm Baptise M.D.   On: 04/28/2021 01:17    Assessment and Plan:   Stacey Gross is a 61 y.o. y/o female who comes in today with a history of dysphagia with dilation of the esophagus in the past.  The patient now comes in with diarrhea and black stools.  The patient now reports black stools with out  any signs of anemia or taking iron or Pepto-Bismol.  The patient's color looks good.  She also reports that she has been having progressive trouble swallowing.  The patient has a history of colonoscopies in the past with multiple polyps in 2019 and 2020.  The patient will be set up for repeat EGD and colonoscopy with possible dilation.  The patient has been explained the plan agrees with it     Lucilla Lame, MD. [facility] Regal    Note: This dictation was prepared with Dragon dictation along with smaller phrase technology. Any transcriptional errors that result from this process are unintentional.

## 2021-05-23 NOTE — H&P (View-Only) (Signed)
Primary Care Physician: Venita Lick, NP  Primary Gastroenterologist:  Dr. Lucilla Lame  Chief Complaint  Patient presents with   Dysphagia    HPI: Stacey Gross is a 61 y.o. female here after seeing me in the past for dysphagia.  The patient saw me back in June 2020 for dysphagia and had an upper endoscopy in August with dilation of the esophagus although it looked normal.  The patient had seen her primary care provider and was reporting issues back in January of this year. At that time the patient was reporting that she was having emesis associated with abdominal pain and diarrhea.  The diarrhea at that time had been going on for a month and was described as watery.  There is also associated vomiting with the diarrhea she had also reported that there was epigastric pain after she eats and she reported that food " hangs out there ". She was on Ozempic and that was stopped when she got the diarrhea but still has the diarrhea. She has lost 10 lbs in 2 weeks because of the swallowing problems. She says the stools are black.  The patient reports that she has more problems with solid food and liquids although liquid sometimes hanging up on her.  The patient also reports that she was good for about 2 years after the last upper endoscopy.  Past Medical History:  Diagnosis Date   Asthma    Cancer ([facility])    stomach   Chronic back pain    COPD (chronic obstructive pulmonary disease) (HCC)    Degenerative joint disease (DJD) of hip    Diabetes mellitus without complication (HCC)    Dyspnea    GERD (gastroesophageal reflux disease)    takes Omeprazole daily   History of bronchitis yrs ago   History of colon polyps    benign   History of staph infection 10 yrs ago   Hyperlipidemia    takes Atorvastatin daily   Hypertension    Peripheral vascular disease (HCC)    Tobacco use    8 cigarettes daily    Weakness    numbness and tingling in both legs     Current Outpatient Medications   Medication Sig Dispense Refill   Accu-Chek Softclix Lancets lancets USE TO CHECK BLOOD SUGAR DAILY 100 each 4   albuterol (PROVENTIL) (2.5 MG/3ML) 0.083% nebulizer solution USE 1 VIAL (3 ML = 2.5 MG TOTAL) BY NEBULIZATION EVERY 6 HOURS AS NEEDED FORWHEEZING OR SHORTNESS OF BREATH 180 mL 4   albuterol (VENTOLIN HFA) 108 (90 Base) MCG/ACT inhaler INHALE 2 PUFFS BY MOUTH INTO THE LUNGS EVERY 6 HOURS AS NEEDED FOR WHEEZING OR SHORTNESS OF BREATH 18 g 5   amoxicillin-clavulanate (AUGMENTIN) 875-125 MG tablet Take 1 tablet by mouth 2 (two) times daily for 7 days. 14 tablet 0   Blood Glucose Monitoring Suppl (ACCU-CHEK GUIDE ME) w/Device KIT Use to check blood sugars 2-3 times daily with goals = <130 fasting in morning and <180 two hours after eating.  Bring blood sugar log to appointments. 1 kit 0   Budeson-Glycopyrrol-Formoterol (BREZTRI AEROSPHERE) 160-9-4.8 MCG/ACT AERO Inhale 2 puffs into the lungs in the morning and at bedtime. 5.9 g 0   Cholecalciferol 1.25 MG (50000 UT) TABS Take 1 tablet by mouth once a week. 12 tablet 4   citalopram (CELEXA) 20 MG tablet Take 1 tablet (20 mg total) by mouth daily. 30 tablet 3   glucose blood test strip Use to check blood  sugar three times a day. 200 each 12   hydrOXYzine (ATARAX) 25 MG tablet TAKE 1 TO 2 TABLETS BY MOUTH EVERY 4 TO 6 HOURS AS NEEDED FOR ITCHING. MAY CAUSEDROWSINESS. 180 tablet 4   lactobacillus acidophilus & bulgar (LACTINEX) chewable tablet Chew 1 tablet by mouth 3 (three) times daily as needed (take after every stools). 30 tablet 1   meloxicam (MOBIC) 15 MG tablet TAKE 1 TABLET BY MOUTH DAILY 30 tablet 3   metFORMIN (GLUCOPHAGE) 500 MG tablet Take 2 tablets (1,000 mg total) by mouth 2 (two) times daily with a meal. 360 tablet 4   olmesartan (BENICAR) 5 MG tablet Take 1 tablet (5 mg total) by mouth daily. 90 tablet 4   pantoprazole (PROTONIX) 40 MG tablet Take 1 tablet (40 mg total) by mouth daily. 90 tablet 4   prochlorperazine (COMPAZINE) 10  MG/2ML injection Inject 2 mLs (10 mg total) into the vein every 6 (six) hours as needed. 30 mL 1   rosuvastatin (CRESTOR) 20 MG tablet Take 1 tablet (20 mg total) by mouth daily. 90 tablet 4   verapamil (CALAN-SR) 120 MG CR tablet Take 1 tablet (120 mg total) by mouth at bedtime. 90 tablet 4   vitamin B-12 (CYANOCOBALAMIN) 1000 MCG tablet Take 1 tablet (1,000 mcg total) by mouth daily. 90 tablet 2   No current facility-administered medications for this visit.    Allergies as of 05/24/2021 - Review Complete 05/17/2021  Allergen Reaction Noted   Doxycycline hyclate Anaphylaxis, Swelling, and Rash    Acetaminophen  01/07/2020   Aspirin  01/09/2020   Latex Rash 09/18/2013   Vancomycin Rash     ROS:  General: Negative for anorexia, weight loss, fever, chills, fatigue, weakness. ENT: Negative for hoarseness, difficulty swallowing , nasal congestion. CV: Negative for chest pain, angina, palpitations, dyspnea on exertion, peripheral edema.  Respiratory: Negative for dyspnea at rest, dyspnea on exertion, cough, sputum, wheezing.  GI: See history of present illness. GU:  Negative for dysuria, hematuria, urinary incontinence, urinary frequency, nocturnal urination.  Endo: Negative for unusual weight change.    Physical Examination:   BP (!) 156/73    Pulse 83    Temp 97.9 F (36.6 C) (Oral)    Ht _0  (1.702 m)    Wt 210 lb (95.3 kg)    BMI 32.89 kg/m   General: Well-nourished, well-developed in no acute distress.  Eyes: No icterus. Conjunctivae pink. Lungs: Clear to auscultation bilaterally. Non-labored. Heart: Regular rate and rhythm, no murmurs rubs or gallops.  Abdomen: Bowel sounds are normal, nontender, nondistended, no hepatosplenomegaly or masses, no abdominal bruits or hernia , no rebound or guarding.   Extremities: No lower extremity edema. No clubbing or deformities. Neuro: Alert and oriented x 3.  Grossly intact. Skin: Warm and dry, no jaundice.   Psych: Alert and  cooperative, normal mood and affect.  Labs:    Imaging Studies: CT ABDOMEN PELVIS W CONTRAST  Result Date: 04/28/2021 CLINICAL DATA:  Diarrhea EXAM: CT ABDOMEN AND PELVIS WITH CONTRAST TECHNIQUE: Multidetector CT imaging of the abdomen and pelvis was performed using the standard protocol following bolus administration of intravenous contrast. RADIATION DOSE REDUCTION: This exam was performed according to the departmental dose-optimization program which includes automated exposure control, adjustment of the mA and/or kV according to patient size and/or use of iterative reconstruction technique. CONTRAST:  158m OMNIPAQUE IOHEXOL 300 MG/ML  SOLN COMPARISON:  08/22/2017 FINDINGS: Lower chest: No acute abnormality. Hepatobiliary: Diffuse low-density throughout the liver compatible with  fatty infiltration. No focal abnormality. Gallbladder unremarkable. Pancreas: No focal abnormality or ductal dilatation. Spleen: No focal abnormality.  Normal size. Adrenals/Urinary Tract: Small cyst in the lower pole of the right kidney. No hydronephrosis. Adrenal glands and urinary bladder unremarkable. Stomach/Bowel: Scattered colonic diverticulosis. No active diverticulitis. Stomach and small bowel unremarkable. No bowel obstruction. Vascular/Lymphatic: Scattered aortic atherosclerosis. No evidence of aneurysm or adenopathy. Reproductive: Prior hysterectomy.  No adnexal masses. Other: No free fluid or free air. Musculoskeletal: Postoperative changes in the lower lumbar spine. No acute bony abnormality. IMPRESSION: Hepatic steatosis. Colonic diverticulosis. Aortic atherosclerosis. No acute findings. Electronically Signed   By: Rolm Baptise M.D.   On: 04/28/2021 01:17    Assessment and Plan:   SANTA ABDELRAHMAN is a 61 y.o. y/o female who comes in today with a history of dysphagia with dilation of the esophagus in the past.  The patient now comes in with diarrhea and black stools.  The patient now reports black stools with out  any signs of anemia or taking iron or Pepto-Bismol.  The patient's color looks good.  She also reports that she has been having progressive trouble swallowing.  The patient has a history of colonoscopies in the past with multiple polyps in 2019 and 2020.  The patient will be set up for repeat EGD and colonoscopy with possible dilation.  The patient has been explained the plan agrees with it     Lucilla Lame, MD. [facility]    Note: This dictation was prepared with Dragon dictation along with smaller phrase technology. Any transcriptional errors that result from this process are unintentional.

## 2021-05-24 ENCOUNTER — Other Ambulatory Visit: Payer: Self-pay

## 2021-05-24 ENCOUNTER — Ambulatory Visit (INDEPENDENT_AMBULATORY_CARE_PROVIDER_SITE_OTHER): Payer: Medicare Other | Admitting: Gastroenterology

## 2021-05-24 ENCOUNTER — Encounter: Payer: Self-pay | Admitting: Gastroenterology

## 2021-05-24 VITALS — BP 156/73 | HR 83 | Temp 97.9°F | Ht 67.0 in | Wt 210.0 lb

## 2021-05-24 DIAGNOSIS — R1319 Other dysphagia: Secondary | ICD-10-CM | POA: Diagnosis not present

## 2021-05-24 DIAGNOSIS — R197 Diarrhea, unspecified: Secondary | ICD-10-CM

## 2021-05-24 MED ORDER — NA SULFATE-K SULFATE-MG SULF 17.5-3.13-1.6 GM/177ML PO SOLN: 1.0000 | Freq: Once | ORAL | 0 refills | Status: AC

## 2021-05-24 NOTE — Addendum Note (Signed)
Addended by: Lurlean Nanny on: 05/24/2021 03:51 PM   Modules accepted: Orders

## 2021-06-03 ENCOUNTER — Encounter: Payer: Self-pay | Admitting: Gastroenterology

## 2021-06-03 ENCOUNTER — Ambulatory Visit: Payer: Medicare Other | Admitting: Gastroenterology

## 2021-06-03 ENCOUNTER — Telehealth: Payer: Self-pay

## 2021-06-03 NOTE — Telephone Encounter (Signed)
-----   Message from Storm Frisk, [place] sent at 06/02/2021  9:36 AM EST ----- ?Regarding: Return call ?Hi Mel, ? ?This patient left a voicemail stating someone from the office called about insurance. I see she has been scheduled for a colonoscopy but I'm not sure who called her. Can you reach out to this patient? ? ? ?Thanks ? ?

## 2021-06-03 NOTE — Telephone Encounter (Signed)
Pt reports that her insurance changed as of March 1st, it is still with [facility] just a different plan. Pt is aware that I need the new member ID # as well as she needs to stop by the office to have the new card scanned in as soon as possible  ?

## 2021-06-08 ENCOUNTER — Encounter: Payer: Self-pay | Admitting: Nurse Practitioner

## 2021-06-08 ENCOUNTER — Telehealth (INDEPENDENT_AMBULATORY_CARE_PROVIDER_SITE_OTHER): Payer: Medicare Other | Admitting: Nurse Practitioner

## 2021-06-08 DIAGNOSIS — J441 Chronic obstructive pulmonary disease with (acute) exacerbation: Secondary | ICD-10-CM | POA: Diagnosis not present

## 2021-06-08 MED ORDER — AMOXICILLIN-POT CLAVULANATE 875-125 MG PO TABS: 1.0000 | ORAL_TABLET | Freq: Two times a day (BID) | ORAL | 0 refills | Status: AC

## 2021-06-08 MED ORDER — HYDROCOD POLI-CHLORPHE POLI ER 10-8 MG/5ML PO SUER: 5.0000 mL | Freq: Two times a day (BID) | ORAL | 0 refills | Status: DC | PRN

## 2021-06-08 MED ORDER — PREDNISONE 20 MG PO TABS: 40.0000 mg | ORAL_TABLET | Freq: Every day | ORAL | 0 refills | Status: AC

## 2021-06-08 NOTE — Progress Notes (Signed)
? ?There were no vitals taken for this visit.  ? ?Subjective:  ? ? Patient ID: Stacey Gross, female    DOB: 01/04/1961, 61 y.o.   MRN: [id number] ? ?HPI: ?Stacey Gross is a 61 y.o. female ? ?Chief Complaint  ?Patient presents with  ? Chest Cold  ?  Patient states she has been symptomatic for about 2 weeks now. Patient states her congestion is so tight that it doesn't want to come up. Patient states she has tried over the counter Mucinex. Patient states she feels that it helps a little and she states she can't breathe when she walks.   ? ?This visit was completed via telephone due to the restrictions of the COVID-19 pandemic. All issues as above were discussed and addressed but no physical exam was performed. If it was felt that the patient should be evaluated in the office, they were directed there. The patient verbally consented to this visit. Patient was unable to complete an audio/visual visit due to Lack of equipment. Due to the catastrophic nature of the COVID-19 pandemic, this visit was done through audio contact only. ?Location of the patient: home ?Location of the provider: work ?Those involved with this call:  ?Provider: Marnee Guarneri, DNP ?CMA: Irena Reichmann, CMA ?Front Desk/Registration: [employer]  ?Time spent on call:  21 minutes on the phone discussing health concerns. 15 minutes total spent in review of patient's record and preparation of their chart.  ?I verified patient identity using two factors (patient name and date of birth). Patient consents verbally to being seen via telemedicine visit today.   ? ?UPPER RESPIRATORY TRACT INFECTION ?Reports being sick for 2 weeks ago.  Has coughing and SOB with this, similar to past exacerbations with emphysema.  Continues to smoke, about < 1 PPD.  Has Breztri and uses daily + currently using nebulizer. Has not seen pulmonary since 10/27/20 -- was using oxygen but this was too expensive and too heavy due to tank.  Covid test at home was  negative. ? ?Last abx use 03/31/21. ?Worst symptom: cough ?Fever: no ?Cough: yes ?Shortness of breath: yes ?Wheezing: yes ?Chest pain: no ?Chest tightness: yes ?Chest congestion: yes ?Nasal congestion: no ?Runny nose: no ?Post nasal drip: no ?Sneezing: no ?Sore throat: no ?Swollen glands: no ?Sinus pressure: yes ?Headache: yes ?Face pain: yes ?Toothache: no ?Ear pain: none ?Ear pressure: none ?Eyes red/itching:no ?Eye drainage/crusting: no  ?Vomiting: no ?Rash: no ?Fatigue: yes ?Sick contacts: no ?Strep contacts: no  ?Context: fluctuating ?Recurrent sinusitis: no ?Relief with OTC cold/cough medications: yes  ?Treatments attempted: mucinex   ? ?Relevant past medical, surgical, family and social history reviewed and updated as indicated. Interim medical history since our last visit reviewed. ?Allergies and medications reviewed and updated. ? ?Review of Systems  ?Constitutional:  Positive for fatigue. Negative for activity change, appetite change, diaphoresis and fever.  ?HENT:  Positive for sinus pressure. Negative for congestion, ear discharge, ear pain, postnasal drip, rhinorrhea, sinus pain, sneezing, sore throat and voice change.   ?Respiratory:  Positive for cough (increased over past 2 weeks), chest tightness and wheezing. Negative for shortness of breath.   ?Cardiovascular:  Negative for chest pain, palpitations and leg swelling.  ?Gastrointestinal: Negative.   ?Endocrine: Negative for polydipsia, polyphagia and polyuria.  ?Neurological: Negative.   ?Psychiatric/Behavioral: Negative.    ? ?Per HPI unless specifically indicated above ? ?   ?Objective:  ?  ?There were no vitals taken for this visit.  ?Wt Readings from Last 3  Encounters:  ?05/24/21 210 lb (95.3 kg)  ?05/10/21 212 lb 9.6 oz (96.4 kg)  ?04/20/21 215 lb (97.5 kg)  ?  ?Physical Exam ? ?Unable to perform due to telephone visit only. ? ?Results for orders placed or performed in visit on 05/10/21  ?CBC with Differential/Platelet  ?Result Value Ref Range   ? WBC 10.0 3.4 - 10.8 x10E3/uL  ? RBC 4.57 3.77 - 5.28 x10E6/uL  ? Hemoglobin 13.4 11.1 - 15.9 g/dL  ? Hematocrit 40.8 34.0 - 46.6 %  ? MCV 89 79 - 97 fL  ? MCH 29.3 26.6 - 33.0 pg  ? MCHC 32.8 31.5 - 35.7 g/dL  ? RDW 13.1 11.7 - 15.4 %  ? Platelets 324 150 - 450 x10E3/uL  ? Neutrophils 65 Not Estab. %  ? Lymphs 28 Not Estab. %  ? Monocytes 5 Not Estab. %  ? Eos 1 Not Estab. %  ? Basos 1 Not Estab. %  ? Neutrophils Absolute 6.6 1.4 - 7.0 x10E3/uL  ? Lymphocytes Absolute 2.7 0.7 - 3.1 x10E3/uL  ? Monocytes Absolute 0.5 0.1 - 0.9 x10E3/uL  ? EOS (ABSOLUTE) 0.1 0.0 - 0.4 x10E3/uL  ? Basophils Absolute 0.1 0.0 - 0.2 x10E3/uL  ? Immature Granulocytes 0 Not Estab. %  ? Immature Grans (Abs) 0.0 0.0 - 0.1 x10E3/uL  ?Comprehensive metabolic panel  ?Result Value Ref Range  ? Glucose 96 70 - 99 mg/dL  ? BUN 10 8 - 27 mg/dL  ? Creatinine, Ser 0.78 0.57 - 1.00 mg/dL  ? eGFR 85 >59 mL/min/1.73  ? BUN/Creatinine Ratio 13 12 - 28  ? Sodium 139 134 - 144 mmol/L  ? Potassium 5.0 3.5 - 5.2 mmol/L  ? Chloride 100 96 - 106 mmol/L  ? CO2 23 20 - 29 mmol/L  ? Calcium 9.5 8.7 - 10.3 mg/dL  ? Total Protein 6.2 6.0 - 8.5 g/dL  ? Albumin 4.3 3.8 - 4.8 g/dL  ? Globulin, Total 1.9 1.5 - 4.5 g/dL  ? Albumin/Globulin Ratio 2.3 (H) 1.2 - 2.2  ? Bilirubin Total 0.4 0.0 - 1.2 mg/dL  ? Alkaline Phosphatase 81 44 - 121 IU/L  ? AST 18 0 - 40 IU/L  ? ALT 20 0 - 32 IU/L  ?TSH  ?Result Value Ref Range  ? TSH 1.120 0.450 - 4.500 uIU/mL  ?Lipase  ?Result Value Ref Range  ? Lipase 42 14 - 72 U/L  ?Amylase  ?Result Value Ref Range  ? Amylase 77 31 - 110 U/L  ? ?   ?Assessment & Plan:  ? ?Problem List Items Addressed This Visit   ? ?  ? Respiratory  ? COPD exacerbation ([place])  ?  Acute and ongoing for 2 weeks with negative Covid testing at home.  She refuses chest imaging, although highly recommended this.  At this time will start Augmentin, Prednisone, and Tussionex which has offered benefit in past.  Recommend she follow-up with pulmonary ASAP and: ?-  Increased rest ?- Increasing Fluids ?- Acetaminophenas needed for fever/pain.  ?- Salt water gargling, chloraseptic spray and throat lozenges ?- Mucinex.  ?- Humidifying the air. ?Return in 1.5 weeks for follow-up and lung check. ?  ?  ? Relevant Medications  ? predniSONE (DELTASONE) 20 MG tablet  ? chlorpheniramine-HYDROcodone (TUSSIONEX PENNKINETIC ER) 10-8 MG/5ML  ?  ?I discussed the assessment and treatment plan with the patient. The patient was provided an opportunity to ask questions and all were answered. The patient agreed with the plan and demonstrated an understanding  of the instructions. ? ? ?The patient was advised to call back or seek an in-person evaluation if the symptoms worsen or if the condition fails to improve as anticipated. ? ? ?I provided 21+ minutes of time during this encounter.  ? ?Follow up plan: ?Return in about 2 weeks (around 06/22/2021) for lung check. ? ? ? ? ? ?

## 2021-06-08 NOTE — Patient Instructions (Signed)

## 2021-06-08 NOTE — Assessment & Plan Note (Signed)
Acute and ongoing for 2 weeks with negative Covid testing at home.  She refuses chest imaging, although highly recommended this.  At this time will start Augmentin, Prednisone, and Tussionex which has offered benefit in past.  Recommend she follow-up with pulmonary ASAP and: ?- Increased rest ?- Increasing Fluids ?- Acetaminophenas needed for fever/pain.  ?- Salt water gargling, chloraseptic spray and throat lozenges ?- Mucinex.  ?- Humidifying the air. ?Return in 1.5 weeks for follow-up and lung check. ?

## 2021-06-09 NOTE — Progress Notes (Signed)
Left message asking pt to call back to schedule fu appt. ?

## 2021-06-10 ENCOUNTER — Ambulatory Visit
Admission: RE | Admit: 2021-06-10 | Discharge: 2021-06-10 | Disposition: A | Discharge status: Home / Self Care | Payer: Medicare Other | Attending: Gastroenterology | Admitting: Gastroenterology

## 2021-06-10 ENCOUNTER — Ambulatory Visit: Payer: Medicare Other | Admitting: Anesthesiology

## 2021-06-10 ENCOUNTER — Encounter: Payer: Self-pay | Admitting: Gastroenterology

## 2021-06-10 ENCOUNTER — Other Ambulatory Visit: Payer: Self-pay

## 2021-06-10 ENCOUNTER — Encounter
Admission: RE | Disposition: A | Discharge status: Home / Self Care | Payer: Self-pay | Source: Home / Self Care | Attending: Gastroenterology

## 2021-06-10 DIAGNOSIS — F17210 Nicotine dependence, cigarettes, uncomplicated: Secondary | ICD-10-CM | POA: Insufficient documentation

## 2021-06-10 DIAGNOSIS — R1319 Other dysphagia: Secondary | ICD-10-CM

## 2021-06-10 DIAGNOSIS — D122 Benign neoplasm of ascending colon: Secondary | ICD-10-CM | POA: Diagnosis not present

## 2021-06-10 DIAGNOSIS — I10 Essential (primary) hypertension: Secondary | ICD-10-CM | POA: Insufficient documentation

## 2021-06-10 DIAGNOSIS — R1310 Dysphagia, unspecified: Secondary | ICD-10-CM | POA: Insufficient documentation

## 2021-06-10 DIAGNOSIS — K219 Gastro-esophageal reflux disease without esophagitis: Secondary | ICD-10-CM | POA: Diagnosis not present

## 2021-06-10 DIAGNOSIS — K5730 Diverticulosis of large intestine without perforation or abscess without bleeding: Secondary | ICD-10-CM | POA: Insufficient documentation

## 2021-06-10 DIAGNOSIS — K2980 Duodenitis without bleeding: Secondary | ICD-10-CM | POA: Diagnosis not present

## 2021-06-10 DIAGNOSIS — Z794 Long term (current) use of insulin: Secondary | ICD-10-CM | POA: Insufficient documentation

## 2021-06-10 DIAGNOSIS — K641 Second degree hemorrhoids: Secondary | ICD-10-CM | POA: Insufficient documentation

## 2021-06-10 DIAGNOSIS — K529 Noninfective gastroenteritis and colitis, unspecified: Secondary | ICD-10-CM | POA: Diagnosis not present

## 2021-06-10 DIAGNOSIS — K635 Polyp of colon: Secondary | ICD-10-CM | POA: Diagnosis not present

## 2021-06-10 DIAGNOSIS — E1151 Type 2 diabetes mellitus with diabetic peripheral angiopathy without gangrene: Secondary | ICD-10-CM | POA: Diagnosis not present

## 2021-06-10 DIAGNOSIS — K921 Melena: Secondary | ICD-10-CM | POA: Diagnosis not present

## 2021-06-10 DIAGNOSIS — D125 Benign neoplasm of sigmoid colon: Secondary | ICD-10-CM | POA: Diagnosis not present

## 2021-06-10 DIAGNOSIS — J449 Chronic obstructive pulmonary disease, unspecified: Secondary | ICD-10-CM | POA: Insufficient documentation

## 2021-06-10 DIAGNOSIS — K259 Gastric ulcer, unspecified as acute or chronic, without hemorrhage or perforation: Secondary | ICD-10-CM | POA: Insufficient documentation

## 2021-06-10 DIAGNOSIS — R197 Diarrhea, unspecified: Secondary | ICD-10-CM

## 2021-06-10 DIAGNOSIS — K2970 Gastritis, unspecified, without bleeding: Secondary | ICD-10-CM | POA: Diagnosis not present

## 2021-06-10 DIAGNOSIS — D123 Benign neoplasm of transverse colon: Secondary | ICD-10-CM | POA: Diagnosis not present

## 2021-06-10 HISTORY — DX: Palpitations: R00.2

## 2021-06-10 HISTORY — DX: Presence of dental prosthetic device (complete) (partial): Z97.2

## 2021-06-10 HISTORY — PX: COLONOSCOPY WITH PROPOFOL: SHX5780

## 2021-06-10 HISTORY — PX: POLYPECTOMY: SHX5525

## 2021-06-10 HISTORY — PX: ESOPHAGEAL DILATION: SHX303

## 2021-06-10 HISTORY — PX: ESOPHAGOGASTRODUODENOSCOPY (EGD) WITH PROPOFOL: SHX5813

## 2021-06-10 LAB — GLUCOSE, CAPILLARY
Glucose-Capillary: 122 mg/dL — ABNORMAL HIGH (ref 70–99)
Glucose-Capillary: 136 mg/dL — ABNORMAL HIGH (ref 70–99)

## 2021-06-10 SURGERY — COLONOSCOPY WITH PROPOFOL
Anesthesia: General | Site: Rectum

## 2021-06-10 MED ORDER — PROPOFOL 10 MG/ML IV BOLUS
INTRAVENOUS | Status: DC | PRN
  Administered 2021-06-10: 20 mg via INTRAVENOUS
  Administered 2021-06-10: 30 mg via INTRAVENOUS
  Administered 2021-06-10: 80 mg via INTRAVENOUS
  Administered 2021-06-10: 20 mg via INTRAVENOUS
  Administered 2021-06-10: 30 mg via INTRAVENOUS
  Administered 2021-06-10: 20 mg via INTRAVENOUS
  Administered 2021-06-10 (×3): 30 mg via INTRAVENOUS
  Administered 2021-06-10 (×3): 20 mg via INTRAVENOUS
  Administered 2021-06-10 (×2): 30 mg via INTRAVENOUS
  Administered 2021-06-10 (×3): 20 mg via INTRAVENOUS

## 2021-06-10 MED ORDER — ACETAMINOPHEN 160 MG/5ML PO SOLN: 325.0000 mg | Freq: Once | ORAL | Status: DC

## 2021-06-10 MED ORDER — GLYCOPYRROLATE 0.2 MG/ML IJ SOLN
INTRAMUSCULAR | Status: DC | PRN
  Administered 2021-06-10: .1 mg via INTRAVENOUS

## 2021-06-10 MED ORDER — STERILE WATER FOR IRRIGATION IR SOLN
Status: DC | PRN
  Administered 2021-06-10 (×2): 1

## 2021-06-10 MED ORDER — LACTATED RINGERS IV SOLN: INTRAVENOUS | Status: DC

## 2021-06-10 MED ORDER — LIDOCAINE HCL (CARDIAC) PF 100 MG/5ML IV SOSY
PREFILLED_SYRINGE | INTRAVENOUS | Status: DC | PRN
  Administered 2021-06-10: 50 mg via INTRAVENOUS

## 2021-06-10 MED ORDER — ACETAMINOPHEN 325 MG PO TABS: 325.0000 mg | ORAL_TABLET | Freq: Once | ORAL | Status: DC

## 2021-06-10 MED ORDER — SODIUM CHLORIDE 0.9 % IV SOLN: INTRAVENOUS | Status: DC

## 2021-06-10 SURGICAL SUPPLY — 13 items
BALLN DILATOR 15-18 8 (BALLOONS) ×4
BALLOON DILATOR 15-18 8 (BALLOONS) IMPLANT
BLOCK BITE 60FR ADLT L/F GRN (MISCELLANEOUS) ×4 IMPLANT
FORCEPS BIOP RAD 4 LRG CAP 4 (CUTTING FORCEPS) ×1 IMPLANT
GOWN CVR UNV OPN BCK APRN NK (MISCELLANEOUS) ×12 IMPLANT
GOWN ISOL THUMB LOOP REG UNIV (MISCELLANEOUS) ×16
KIT PRC NS LF DISP ENDO (KITS) ×6 IMPLANT
KIT PROCEDURE OLYMPUS (KITS) ×8
MANIFOLD NEPTUNE II (INSTRUMENTS) ×8 IMPLANT
SNARE COLD EXACTO (MISCELLANEOUS) ×1 IMPLANT
SYR INFLATION 60ML (SYRINGE) ×1 IMPLANT
TRAP ETRAP POLY (MISCELLANEOUS) ×1 IMPLANT
WATER STERILE IRR 250ML POUR (IV SOLUTION) ×8 IMPLANT

## 2021-06-10 NOTE — Op Note (Signed)
[facility] ?Gastroenterology ?Patient Name: Stacey Gross ?Procedure Date: 06/10/2021 9:18 AM ?MRN: [id number] ?Account #: [id number] ?Date of Birth: October 23, 1960 ?Admit Type: Outpatient ?Age: 61 ?Room: [facility] OR ROOM 01 ?Gender: Female ?Note Status: Finalized ?Instrument Name: [id number] ?Procedure:             Colonoscopy ?Indications:           Chronic diarrhea ?Providers:             Lucilla Lame MD, MD ?Medicines:             Propofol per Anesthesia ?Complications:         No immediate complications. ?Procedure:             Pre-Anesthesia Assessment: ?                       - Prior to the procedure, a History and Physical was  ?                       performed, and patient medications and allergies were  ?                       reviewed. The patient's tolerance of previous  ?                       anesthesia was also reviewed. The risks and benefits  ?                       of the procedure and the sedation options and risks  ?                       were discussed with the patient. All questions were  ?                       answered, and informed consent was obtained. Prior  ?                       Anticoagulants: The patient has taken no previous  ?                       anticoagulant or antiplatelet agents. ASA Grade  ?                       Assessment: II - A patient with mild systemic disease.  ?                       After reviewing the risks and benefits, the patient  ?                       was deemed in satisfactory condition to undergo the  ?                       procedure. ?                       After obtaining informed consent, the colonoscope was  ?                       passed under direct vision. Throughout the procedure,  ?  the patient's blood pressure, pulse, and oxygen  ?                       saturations were monitored continuously. The  ?                       Colonoscope was introduced through the anus and  ?                       advanced to the the cecum,  identified by appendiceal  ?                       orifice and ileocecal valve. The colonoscopy was  ?                       performed without difficulty. The patient tolerated  ?                       the procedure well. The quality of the bowel  ?                       preparation was good. ?Findings: ?     The perianal and digital rectal examinations were normal. ?     The terminal ileum appeared normal. Biopsies were taken with a cold  ?     forceps for histology. ?     Two sessile polyps were found in the ascending colon. The polyps were 2  ?     to 3 mm in size. These polyps were removed with a cold biopsy forceps.  ?     Resection and retrieval were complete. ?     Two sessile polyps were found in the transverse colon. The polyps were 4  ?     to 6 mm in size. These polyps were removed with a cold snare. Resection  ?     and retrieval were complete. ?     A 5 mm polyp was found in the descending colon. The polyp was sessile.  ?     The polyp was removed with a cold snare. Resection and retrieval were  ?     complete. ?     A 5 mm polyp was found in the sigmoid colon. The polyp was sessile. The  ?     polyp was removed with a cold snare. Resection and retrieval were  ?     complete. ?     Random biopsies were obtained with cold forceps for histology randomly  ?     in the entire colon. ?     Multiple small-mouthed diverticula were found in the entire colon. ?     Non-bleeding internal hemorrhoids were found during retroflexion. The  ?     hemorrhoids were Grade II (internal hemorrhoids that prolapse but reduce  ?     spontaneously). ?Impression:            - The examined portion of the ileum was normal.  ?                       Biopsied. ?                       - Two 2 to 3 mm polyps in the ascending colon, removed  ?  with a cold biopsy forceps. Resected and retrieved. ?                       - Two 4 to 6 mm polyps in the transverse colon,  ?                       removed with a cold  snare. Resected and retrieved. ?                       - One 5 mm polyp in the descending colon, removed with  ?                       a cold snare. Resected and retrieved. ?                       - One 5 mm polyp in the sigmoid colon, removed with a  ?                       cold snare. Resected and retrieved. ?                       - Diverticulosis in the entire examined colon. ?                       - Non-bleeding internal hemorrhoids. ?                       - Random biopsies were obtained in the entire colon. ?Recommendation:        - Discharge patient to home. ?                       - Resume previous diet. ?                       - Continue present medications. ?                       - Await pathology results. ?                       - If the pathology report reveals adenomatous tissue,  ?                       then repeat the colonoscopy for surveillance in 3  ?                       years. ?Procedure Code(s):     --- Professional --- ?                       [phone], Colonoscopy, flexible; with removal of  ?                       tumor(s), polyp(s), or other lesion(s) by snare  ?                       technique ?                       [id number], 59, Colonoscopy, flexible; with biopsy, single  ?  or multiple ?Diagnosis Code(s):     --- Professional --- ?                       K52.9, Noninfective gastroenteritis and colitis,  ?                       unspecified ?                       K63.5, Polyp of colon ?CPT copyright 2019 American Medical Association. All rights reserved. ?The codes documented in this report are preliminary and upon coder review may  ?be revised to meet current compliance requirements. ?Lucilla Lame MD, MD ?06/10/2021 10:09:28 AM ?This report has been signed electronically. ?Number of Addenda: 0 ?Note Initiated On: 06/10/2021 9:18 AM ?Scope Withdrawal Time: 0 hours 8 minutes 40 seconds  ?Total Procedure Duration: 0 hours 13 minutes 54 seconds  ?Estimated Blood Loss:  Estimated  blood loss: none. ?     [facility] ?

## 2021-06-10 NOTE — Transfer of Care (Signed)
Immediate Anesthesia Transfer of Care Note ? ?Patient: Stacey Gross ? ?Procedure(s) Performed: COLONOSCOPY WITH BIOPSY (Rectum) ?POLYPECTOMY (Rectum) ?ESOPHAGOGASTRODUODENOSCOPY (EGD) WITH BIOPSY (Esophagus) ?ESOPHAGEAL DILATION (Esophagus) ? ?Patient Location: PACU ? ?Anesthesia Type: General ? ?Level of Consciousness: awake, alert  and patient cooperative ? ?Airway and Oxygen Therapy: Patient Spontanous Breathing and Patient connected to supplemental oxygen ? ?Post-op Assessment: Post-op Vital signs reviewed, Patient's Cardiovascular Status Stable, Respiratory Function Stable, Patent Airway and No signs of Nausea or vomiting ? ?Post-op Vital Signs: Reviewed and stable ? ?Complications: No notable events documented. ? ?

## 2021-06-10 NOTE — Interval H&P Note (Signed)
Lucilla Lame, MD [facility] [address] [place], [place] [address] Phone:[phone] Fax : [phone]  Primary Care Physician:  Venita Lick, NP Primary Gastroenterologist:  Dr. Allen Norris  Pre-Procedure History & Physical: HPI:  Stacey Gross is a 61 y.o. female is here for an endoscopy and colonoscopy.   Past Medical History:  Diagnosis Date   Asthma    Cancer ([place])    stomach   Chronic back pain    COPD (chronic obstructive pulmonary disease) (HCC)    Degenerative joint disease (DJD) of hip    Diabetes mellitus without complication (HCC)    Dyspnea    GERD (gastroesophageal reflux disease)    takes Omeprazole daily   History of bronchitis yrs ago   History of colon polyps    benign   History of staph infection 10 yrs ago   Hyperlipidemia    takes Atorvastatin daily   Hypertension    Palpitations    Peripheral vascular disease (HCC)    Tobacco use    8 cigarettes daily    Weakness    numbness and tingling in both legs    Wears dentures    full upper    Past Surgical History:  Procedure Laterality Date   APPENDECTOMY     BACK SURGERY  9-10 yrs ago   BREAST BIOPSY Right    Core- neg   COLONOSCOPY     COLONOSCOPY WITH PROPOFOL N/A 09/20/2017   Procedure: COLONOSCOPY WITH PROPOFOL;  Surgeon: Virgel Manifold, MD;  Location: [facility] ENDOSCOPY;  Service: Endoscopy;  Laterality: N/A;   COLONOSCOPY WITH PROPOFOL N/A 04/24/2018   Procedure: COLONOSCOPY WITH PROPOFOL;  Surgeon: Lucilla Lame, MD;  Location: [facility] ENDOSCOPY;  Service: Endoscopy;  Laterality: N/A;   ESOPHAGOGASTRODUODENOSCOPY (EGD) WITH PROPOFOL N/A 09/20/2017   Procedure: ESOPHAGOGASTRODUODENOSCOPY (EGD) WITH PROPOFOL;  Surgeon: Virgel Manifold, MD;  Location: [facility] ENDOSCOPY;  Service: Endoscopy;  Laterality: N/A;   ESOPHAGOGASTRODUODENOSCOPY (EGD) WITH PROPOFOL N/A 11/20/2018   Procedure: ESOPHAGOGASTRODUODENOSCOPY (EGD) WITH PROPOFOL;  Surgeon: Lucilla Lame, MD;  Location: [facility]  ENDOSCOPY;  Service: Endoscopy;  Laterality: N/A;   PERIPHERAL VASCULAR CATHETERIZATION Left 03/17/2016   Procedure: Lower Extremity Angiography;  Surgeon: Algernon Huxley, MD;  Location: [place] [facility];  Service: Cardiovascular;  Laterality: Left;   TOTAL ABDOMINAL HYSTERECTOMY W/ BILATERAL SALPINGOOPHORECTOMY     total   TOTAL SHOULDER ARTHROPLASTY Right 07/24/2019   Procedure: RIGHT TOTAL SHOULDER ARTHROPLASTY;  Surgeon: Lovell Sheehan, MD;  Location: [facility] ORS;  Service: Orthopedics;  Laterality: Right;    Prior to Admission medications   Medication Sig Start Date End Date Taking? Authorizing Provider  albuterol (PROVENTIL) (2.5 MG/3ML) 0.083% nebulizer solution USE 1 VIAL (3 ML = 2.5 MG TOTAL) BY NEBULIZATION EVERY 6 HOURS AS NEEDED FORWHEEZING OR SHORTNESS OF BREATH 07/30/20  Yes Cannady, Jolene T, NP  albuterol (VENTOLIN HFA) 108 (90 Base) MCG/ACT inhaler INHALE 2 PUFFS BY MOUTH INTO THE LUNGS EVERY 6 HOURS AS NEEDED FOR WHEEZING OR SHORTNESS OF BREATH 07/22/20  Yes Cannady, Jolene T, NP  amoxicillin-clavulanate (AUGMENTIN) 875-125 MG tablet Take 1 tablet by mouth 2 (two) times daily for 7 days. 06/08/21 06/15/21 Yes Cannady, Jolene T, NP  Budeson-Glycopyrrol-Formoterol (BREZTRI AEROSPHERE) 160-9-4.8 MCG/ACT AERO Inhale 2 puffs into the lungs in the morning and at bedtime. 09/08/20  Yes Tanda Rockers, MD  citalopram (CELEXA) 20 MG tablet Take 1 tablet (20 mg total) by mouth daily. 05/13/20  Yes Cannady, Barbaraann Faster, NP  hydrOXYzine (ATARAX)  25 MG tablet TAKE 1 TO 2 TABLETS BY MOUTH EVERY 4 TO 6 HOURS AS NEEDED FOR ITCHING. MAY CAUSEDROWSINESS. 03/31/21  Yes Cannady, Barbaraann Faster, NP  lactobacillus acidophilus & bulgar (LACTINEX) chewable tablet Chew 1 tablet by mouth 3 (three) times daily as needed (take after every stools). 04/20/21  Yes Vigg, Avanti, MD  metFORMIN (GLUCOPHAGE) 500 MG tablet Take 2 tablets (1,000 mg total) by mouth 2 (two) times daily with a meal. 03/31/21  Yes Cannady, Jolene T, NP   olmesartan (BENICAR) 5 MG tablet Take 1 tablet (5 mg total) by mouth daily. 03/31/21  Yes Cannady, Jolene T, NP  OZEMPIC, 1 MG/DOSE, 4 MG/3ML SOPN Inject into the skin. 05/11/21  Yes [provider]  pantoprazole (PROTONIX) 40 MG tablet Take 1 tablet (40 mg total) by mouth daily. 03/31/21  Yes Cannady, Jolene T, NP  predniSONE (DELTASONE) 20 MG tablet Take 2 tablets (40 mg total) by mouth daily with breakfast for 5 days. 06/08/21 06/13/21 Yes Cannady, Jolene T, NP  prochlorperazine (COMPAZINE) 10 MG tablet Take by mouth. 05/10/21  Yes [provider]  prochlorperazine (COMPAZINE) 10 MG/2ML injection Inject 2 mLs (10 mg total) into the vein every 6 (six) hours as needed. Patient taking differently: Inject 10 mg into the vein every 6 (six) hours as needed. Pt says pills, not injection 05/10/21  Yes Cannady, Jolene T, NP  rosuvastatin (CRESTOR) 20 MG tablet Take 1 tablet (20 mg total) by mouth daily. 03/31/21  Yes Cannady, Jolene T, NP  verapamil (CALAN-SR) 120 MG CR tablet Take 1 tablet (120 mg total) by mouth at bedtime. 03/31/21  Yes Cannady, Jolene T, NP  vitamin B-12 (CYANOCOBALAMIN) 1000 MCG tablet Take 1 tablet (1,000 mcg total) by mouth daily. 05/16/19  Yes Cannady, Henrine Screws T, NP  Accu-Chek Softclix Lancets lancets USE TO CHECK BLOOD SUGAR DAILY 05/12/21   Cannady, Henrine Screws T, NP  Blood Glucose Monitoring Suppl (ACCU-CHEK GUIDE ME) w/Device KIT Use to check blood sugars 2-3 times daily with goals = <130 fasting in morning and <180 two hours after eating.  Bring blood sugar log to appointments. 05/11/21   Marnee Guarneri T, NP  chlorpheniramine-HYDROcodone (TUSSIONEX PENNKINETIC ER) 10-8 MG/5ML Take 5 mLs by mouth every 12 (twelve) hours as needed for cough. 06/08/21   Cannady, Henrine Screws T, NP  Cholecalciferol 1.25 MG (50000 UT) TABS Take 1 tablet by mouth once a week. 04/01/21   Cannady, Henrine Screws T, NP  glucose blood test strip Use to check blood sugar three times a day. 05/11/21   Marnee Guarneri T, NP     Allergies as of 05/24/2021 - Review Complete 05/17/2021  Allergen Reaction Noted   Doxycycline hyclate Anaphylaxis, Swelling, and Rash    Acetaminophen  01/07/2020   Aspirin  01/09/2020   Latex Rash 09/18/2013   Vancomycin Rash     Family History  Problem Relation Age of Onset   Hyperlipidemia Mother    Hyperlipidemia Father    Melanoma Sister    Breast cancer Neg Hx     Social History   Socioeconomic History   Marital status: Widowed    Spouse name: Not on file   Number of children: 0   Years of education: 8   Highest education level: Not on file  Occupational History   Occupation: disabled  Tobacco Use   Smoking status: Every Day    Packs/day: 1.50    Years: 48.00    Pack years: 72.00    Types: Cigarettes   Smokeless tobacco:  Never   Tobacco comments:    Started smoking around age 61  Vaping Use   Vaping Use: Never used  Substance and Sexual Activity   Alcohol use: No   Drug use: No   Sexual activity: Not on file  Other Topics Concern   Not on file  Social History Narrative   Not on file   Social Determinants of Health   Financial Resource Strain: Low Risk    Difficulty of Paying Living Expenses: Not hard at all  Food Insecurity: No Food Insecurity   Worried About Charity fundraiser in the Last Year: Never true   Earth in the Last Year: Never true  Transportation Needs: No Transportation Needs   Lack of Transportation (Medical): No   Lack of Transportation (Non-Medical): No  Physical Activity: Inactive   Days of Exercise per Week: 0 days   Minutes of Exercise per Session: 0 min  Stress: No Stress Concern Present   Feeling of Stress : Not at all  Social Connections: Socially Isolated   Frequency of Communication with Friends and Family: Once a week   Frequency of Social Gatherings with Friends and Family: More than three times a week   Attends Religious Services: Never   Marine scientist or Organizations: No   Attends Programme researcher, broadcasting/film/video: Never   Marital Status: Divorced  Human resources officer Violence: Not At Risk   Fear of Current or Ex-Partner: No   Emotionally Abused: No   Physically Abused: No   Sexually Abused: No    Review of Systems: See HPI, otherwise negative ROS  Physical Exam: BP (!) 142/79    Pulse 80    Temp 97.7 F (36.5 C) (Temporal)    Resp (!) 21    Ht _0  (1.702 m)    Wt 91.6 kg    SpO2 97%    BMI 31.64 kg/m  General:   Alert,  pleasant and cooperative in NAD Head:  Normocephalic and atraumatic. Neck:  Supple; no masses or thyromegaly. Lungs:  Clear throughout to auscultation.    Heart:  Regular rate and rhythm. Abdomen:  Soft, nontender and nondistended. Normal bowel sounds, without guarding, and without rebound.   Neurologic:  Alert and  oriented x4;  grossly normal neurologically.  Impression/Plan: [name] is here for an endoscopy and colonoscopy to be performed for diarrhea and dysphagia  Risks, benefits, limitations, and alternatives regarding  endoscopy and colonoscopy have been reviewed with the patient.  Questions have been answered.  All parties agreeable.   Lucilla Lame, MD  06/10/2021, 8:56 AM

## 2021-06-10 NOTE — Anesthesia Preprocedure Evaluation (Signed)
Anesthesia Evaluation  ?Patient identified by MRN, date of birth, ID band ?Patient awake ? ? ? ?Reviewed: ?Allergy & Precautions, H&P , NPO status , Patient's Chart, lab work & pertinent test results ? ?Airway ?Mallampati: III ? ?TM Distance: >3 FB ?Neck ROM: full ? ? ? Dental ?no notable dental hx. ?(+) Edentulous Upper ?  ?Pulmonary ?shortness of breath and with exertion, COPD,  COPD inhaler, Current SmokerPatient did not abstain from smoking.,  ?  ?Pulmonary exam normal ?breath sounds clear to auscultation ? ? ? ? ? ? Cardiovascular ?hypertension, + Peripheral Vascular Disease  ?Normal cardiovascular exam ?Rhythm:regular Rate:Normal ? ? ?  ?Neuro/Psych ?PSYCHIATRIC DISORDERS   ? GI/Hepatic ?GERD  ,  ?Endo/Other  ?diabetes, Insulin Dependent ? Renal/GU ?  ? ?  ?Musculoskeletal ? ? Abdominal ?  ?Peds ? Hematology ?  ?Anesthesia Other Findings ? ? Reproductive/Obstetrics ? ?  ? ? ? ? ? ? ? ? ? ? ? ? ? ?  ?  ? ? ? ? ? ? ? ? ?Anesthesia Physical ?Anesthesia Plan ? ?ASA: 3 ? ?Anesthesia Plan: General  ? ?Post-op Pain Management: Minimal or no pain anticipated  ? ?Induction: Intravenous ? ?PONV Risk Score and Plan: 2 and Treatment may vary due to age or medical condition, Propofol infusion and TIVA ? ?Airway Management Planned: Natural Airway ? ?Additional Equipment:  ? ?Intra-op Plan:  ? ?Post-operative Plan:  ? ?Informed Consent: I have reviewed the patients History and Physical, chart, labs and discussed the procedure including the risks, benefits and alternatives for the proposed anesthesia with the patient or authorized representative who has indicated his/her understanding and acceptance.  ? ? ? ?Dental Advisory Given ? ?Plan Discussed with: CRNA ? ?Anesthesia Plan Comments:   ? ? ? ? ? ? ?Anesthesia Quick Evaluation ? ?

## 2021-06-10 NOTE — Op Note (Signed)
[facility] ?Gastroenterology ?Patient Name: Stacey Gross ?Procedure Date: 06/10/2021 9:22 AM ?MRN: [id number] ?Account #: [id number] ?Date of Birth: Aug 21, 1960 ?Admit Type: Outpatient ?Age: 61 ?Room: [facility] OR ROOM 1 ?Gender: Female ?Note Status: Finalized ?Instrument Name: [id number] ?Procedure:             Upper GI endoscopy ?Indications:           Dysphagia ?Providers:             Lucilla Lame MD, MD ?Referring MD:          Barbaraann Faster. Ned Card (Referring MD) ?Medicines:             Propofol per Anesthesia ?Complications:         No immediate complications. ?Procedure:             Pre-Anesthesia Assessment: ?                       - Prior to the procedure, a History and Physical was  ?                       performed, and patient medications and allergies were  ?                       reviewed. The patient's tolerance of previous  ?                       anesthesia was also reviewed. The risks and benefits  ?                       of the procedure and the sedation options and risks  ?                       were discussed with the patient. All questions were  ?                       answered, and informed consent was obtained. Prior  ?                       Anticoagulants: The patient has taken no previous  ?                       anticoagulant or antiplatelet agents. ASA Grade  ?                       Assessment: II - A patient with mild systemic disease.  ?                       After reviewing the risks and benefits, the patient  ?                       was deemed in satisfactory condition to undergo the  ?                       procedure. ?                       After obtaining informed consent, the endoscope was  ?  passed under direct vision. Throughout the procedure,  ?                       the patient's blood pressure, pulse, and oxygen  ?                       saturations were monitored continuously. The was  ?                       introduced through the mouth, and advanced  to the  ?                       second part of duodenum. The upper GI endoscopy was  ?                       accomplished without difficulty. The patient tolerated  ?                       the procedure well. ?Findings: ?     The examined esophagus was normal. A TTS dilator was passed through the  ?     scope. Dilation with a 15-16.5-18 mm balloon dilator was performed to 18  ?     mm. The dilation site was examined following endoscope reinsertion and  ?     showed complete resolution of luminal narrowing. ?     Moderate inflammation characterized by erosions was found in the gastric  ?     antrum. Biopsies were taken with a cold forceps for histology. ?     Diffuse moderate inflammation characterized by erythema was found in the  ?     duodenal bulb. Biopsies were taken with a cold forceps for histology. ?Impression:            - Normal esophagus. Dilated. ?                       - Gastritis. Biopsied. ?                       - Duodenitis. Biopsied. ?Recommendation:        - Discharge patient to home. ?                       - Resume previous diet. ?                       - Continue present medications. ?                       - Await pathology results. ?                       - Perform a colonoscopy today. ?Procedure Code(s):     --- Professional --- ?                       [phone], Esophagogastroduodenoscopy, flexible,  ?                       transoral; with transendoscopic balloon dilation of  ?                       esophagus (less than  30 mm diameter) ?                       43239, 59, Esophagogastroduodenoscopy, flexible,  ?                       transoral; with biopsy, single or multiple ?Diagnosis Code(s):     --- Professional --- ?                       R13.10, Dysphagia, unspecified ?                       K29.70, Gastritis, unspecified, without bleeding ?CPT copyright 2019 American Medical Association. All rights reserved. ?The codes documented in this report are preliminary and upon coder review may  ?be  revised to meet current compliance requirements. ?Lucilla Lame MD, MD ?06/10/2021 9:43:41 AM ?This report has been signed electronically. ?Number of Addenda: 0 ?Note Initiated On: 06/10/2021 9:22 AM ?Total Procedure Duration: 0 hours 5 minutes 39 seconds  ?Estimated Blood Loss:  Estimated blood loss: none. ?     [facility] ?

## 2021-06-10 NOTE — Anesthesia Procedure Notes (Signed)
Date/Time: 06/10/2021 9:30 AM ?Performed by: Mayme Genta, CRNA ?Pre-anesthesia Checklist: Patient identified, Emergency Drugs available, Suction available, Timeout performed and Patient being monitored ?Patient Re-evaluated:Patient Re-evaluated prior to induction ?Oxygen Delivery Method: Nasal cannula ?Placement Confirmation: positive ETCO2 ? ? ? ? ?

## 2021-06-10 NOTE — Anesthesia Postprocedure Evaluation (Signed)
Anesthesia Post Note ? ?Patient: CASSIDEY BARRALES ? ?Procedure(s) Performed: COLONOSCOPY WITH BIOPSY (Rectum) ?POLYPECTOMY (Rectum) ?ESOPHAGOGASTRODUODENOSCOPY (EGD) WITH BIOPSY (Esophagus) ?ESOPHAGEAL DILATION (Esophagus) ? ? ?  ?Patient location during evaluation: PACU ?Anesthesia Type: General ?Level of consciousness: awake and alert and oriented ?Pain management: satisfactory to patient ?Vital Signs Assessment: post-procedure vital signs reviewed and stable ?Respiratory status: spontaneous breathing, nonlabored ventilation and respiratory function stable ?Cardiovascular status: blood pressure returned to baseline and stable ?Postop Assessment: Adequate PO intake and No signs of nausea or vomiting ?Anesthetic complications: no ? ? ?No notable events documented. ? ?Raliegh Ip ? ? ? ? ? ?

## 2021-06-11 ENCOUNTER — Encounter: Payer: Self-pay | Admitting: Gastroenterology

## 2021-06-11 LAB — SURGICAL PATHOLOGY

## 2021-06-16 ENCOUNTER — Encounter: Payer: Self-pay | Admitting: Gastroenterology

## 2021-06-16 DIAGNOSIS — H35013 Changes in retinal vascular appearance, bilateral: Secondary | ICD-10-CM | POA: Diagnosis not present

## 2021-06-16 DIAGNOSIS — H25043 Posterior subcapsular polar age-related cataract, bilateral: Secondary | ICD-10-CM | POA: Diagnosis not present

## 2021-06-16 DIAGNOSIS — E119 Type 2 diabetes mellitus without complications: Secondary | ICD-10-CM | POA: Diagnosis not present

## 2021-06-16 DIAGNOSIS — H2513 Age-related nuclear cataract, bilateral: Secondary | ICD-10-CM | POA: Diagnosis not present

## 2021-06-16 DIAGNOSIS — H353132 Nonexudative age-related macular degeneration, bilateral, intermediate dry stage: Secondary | ICD-10-CM | POA: Diagnosis not present

## 2021-06-16 LAB — HM DIABETES EYE EXAM

## 2021-06-29 ENCOUNTER — Other Ambulatory Visit: Payer: Self-pay | Admitting: Nurse Practitioner

## 2021-06-29 ENCOUNTER — Ambulatory Visit (INDEPENDENT_AMBULATORY_CARE_PROVIDER_SITE_OTHER): Payer: Medicare Other | Admitting: Nurse Practitioner

## 2021-06-29 ENCOUNTER — Encounter: Payer: Self-pay | Admitting: Nurse Practitioner

## 2021-06-29 ENCOUNTER — Other Ambulatory Visit: Payer: Self-pay

## 2021-06-29 VITALS — BP 122/77 | HR 71 | Temp 98.2°F | Wt 210.8 lb

## 2021-06-29 DIAGNOSIS — Z6833 Body mass index (BMI) 33.0-33.9, adult: Secondary | ICD-10-CM

## 2021-06-29 DIAGNOSIS — K219 Gastro-esophageal reflux disease without esophagitis: Secondary | ICD-10-CM | POA: Diagnosis not present

## 2021-06-29 DIAGNOSIS — I152 Hypertension secondary to endocrine disorders: Secondary | ICD-10-CM

## 2021-06-29 DIAGNOSIS — E785 Hyperlipidemia, unspecified: Secondary | ICD-10-CM

## 2021-06-29 DIAGNOSIS — E1151 Type 2 diabetes mellitus with diabetic peripheral angiopathy without gangrene: Secondary | ICD-10-CM | POA: Diagnosis not present

## 2021-06-29 DIAGNOSIS — I739 Peripheral vascular disease, unspecified: Secondary | ICD-10-CM | POA: Diagnosis not present

## 2021-06-29 DIAGNOSIS — E1159 Type 2 diabetes mellitus with other circulatory complications: Secondary | ICD-10-CM

## 2021-06-29 DIAGNOSIS — I700 Atherosclerosis of aorta: Secondary | ICD-10-CM

## 2021-06-29 DIAGNOSIS — J432 Centrilobular emphysema: Secondary | ICD-10-CM | POA: Diagnosis not present

## 2021-06-29 DIAGNOSIS — D692 Other nonthrombocytopenic purpura: Secondary | ICD-10-CM

## 2021-06-29 DIAGNOSIS — E1169 Type 2 diabetes mellitus with other specified complication: Secondary | ICD-10-CM

## 2021-06-29 DIAGNOSIS — F17219 Nicotine dependence, cigarettes, with unspecified nicotine-induced disorders: Secondary | ICD-10-CM

## 2021-06-29 DIAGNOSIS — J9612 Chronic respiratory failure with hypercapnia: Secondary | ICD-10-CM

## 2021-06-29 DIAGNOSIS — I75023 Atheroembolism of bilateral lower extremities: Secondary | ICD-10-CM

## 2021-06-29 DIAGNOSIS — E538 Deficiency of other specified B group vitamins: Secondary | ICD-10-CM | POA: Diagnosis not present

## 2021-06-29 DIAGNOSIS — F411 Generalized anxiety disorder: Secondary | ICD-10-CM

## 2021-06-29 DIAGNOSIS — J9611 Chronic respiratory failure with hypoxia: Secondary | ICD-10-CM | POA: Diagnosis not present

## 2021-06-29 DIAGNOSIS — E6609 Other obesity due to excess calories: Secondary | ICD-10-CM

## 2021-06-29 DIAGNOSIS — E559 Vitamin D deficiency, unspecified: Secondary | ICD-10-CM

## 2021-06-29 LAB — MICROALBUMIN, URINE WAIVED
Creatinine, Urine Waived: 100 mg/dL (ref 10–300)
Microalb, Ur Waived: 30 mg/L — ABNORMAL HIGH (ref 0–19)
Microalb/Creat Ratio: <30 mg/g (ref <30)

## 2021-06-29 LAB — BAYER DCA HB A1C WAIVED: HB A1C (BAYER DCA - WAIVED): 6.3 % — ABNORMAL HIGH (ref 4.8–5.6)

## 2021-06-29 MED ORDER — BREZTRI AEROSPHERE 160-9-4.8 MCG/ACT IN AERO: 2.0000 | INHALATION_SPRAY | Freq: Two times a day (BID) | RESPIRATORY_TRACT | 4 refills | Status: DC

## 2021-06-29 NOTE — Progress Notes (Signed)
? ?BP 122/77   Pulse 71   Temp 98.2 ?F (36.8 ?C) (Oral)   Wt 210 lb 12.8 oz (95.6 kg)   SpO2 97%   BMI 33.02 kg/m?   ? ?Subjective:  ? ? Patient ID: Stacey Gross, female    DOB: 09-02-1960, 61 y.o.   MRN: [id number] ? ?HPI: ?Stacey Gross is a 61 y.o. female ? ?Chief Complaint  ?Patient presents with  ? Diabetes  ?  Patient recent Diabetic Eye Exam was requested at today's visit.  ? Hyperlipidemia  ? Hypertension  ? ?DIABETES ?A1c last check was 7.6% in December.  Continues on Metformin 1000 MG BID, stopped Ozempic due to diarrhea and nausea, however symptoms remain same without it on board.  Saw GI and had colonoscopy/EGD on 06/10/21 = food is still getting hung she reports, felt good for 2 days after procedure.  Her B12 level remained low last check, she is taking supplement.  Also continues on Vitamin D for history of low levels. ? ?Continues her Protonix for GERD.   ?Hypoglycemic episodes:no ?Polydipsia/polyuria: no ?Visual disturbance: no ?Chest pain: no ?Paresthesias: no ?Glucose Monitoring: yes ?            Accucheck frequency: once a day -- recent was 176 in a morning check ?            Fasting glucose:  ?            Post prandial: ?            Evening: ?            Before meals: ?Taking Insulin?: no ?            Long acting insulin: ?            Short acting insulin: ?Blood Pressure Monitoring: not checking ?Retinal Examination: Up To Date = went to [facility], has to have cataract surgery ?Foot Exam: Up to Date ?Pneumovax: Up to Date ?Influenza: Not Up to Date -- refuses today ?Aspirin: no  ?  ?HYPERTENSION / HYPERLIPIDEMIA ?Was on Olmesartan 5 MG and Crestor 20 MG daily.   Seeing cardiology and last saw 10/02/20, continues on Verapamil which was started by them.  EF on echo June 2022 was 60-65%. ? 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 ?Satisfied with current treatment? yes ?Duration of hypertension: chronic ?BP monitoring frequency: not  checking ?BP range:  ?BP medication side effects: no ?Duration of hyperlipidemia: chronic ?Cholesterol medication side effects: no ?Cholesterol supplements: none ?Medication compliance: good compliance ?Aspirin: no ?Recent stressors: no ?Recurrent headaches: no ?Visual changes: no ?Palpitations: no ?Dyspnea: at baseline ?Chest pain: none ?Lower extremity edema: no ?Dizzy/lightheaded: no  ? ?COPD ?Continues to smoke < 1 PPD, not interested in quitting -- has been smoking since age 65.  Went for lung CT screening 06/07/20 == emphysema and aortic atherosclerosis. ? ?Seeing pulmonary with last visit 10/27/20 -- they recommended to continue Breztri as in Group D for symptoms and COPD.  Plus Albuterol as needed -- uses this 3 times a day depending on what she is doing.   ?COPD status: stable ?Satisfied with current treatment?: yes ?Oxygen use: no ?Dyspnea frequency: occasional at baseline ?Cough frequency: at baseline ?Rescue inhaler frequency:  every day ?Limitation of activity: no ?Productive cough: yes over past two weeks ?Last Spirometry: with pulmonary ?Pneumovax: Up to Date ?Influenza: Refuses ? ?ANXIETY/STRESS ?Currently only taking Celexa as needed. ?Duration:stable ?  Anxious mood: yes  ?Excessive worrying: yes ?Irritability: yes  ?Sweating: no ?Nausea: no ?Palpitations:no ?Hyperventilation: no ?Panic attacks: no ?Agoraphobia: no  ?Obscessions/compulsions: no ?Depressed mood: no ? ?  05/17/2021  ? 11:44 AM 04/13/2021  ? 11:56 AM 05/15/2020  ? 11:17 AM 05/13/2020  ?  9:23 AM 01/07/2020  ?  9:38 AM  ?Depression screen PHQ 2/9  ?Decreased Interest 0 0 0 3 0  ?Down, Depressed, Hopeless 0 0 0 0 0  ?PHQ - 2 Score 0 0 0 3 0  ?Altered sleeping 0 0  3 1  ?Tired, decreased energy 0 0  3 3  ?Change in appetite 0 0  0 0  ?Feeling bad or failure about yourself  0 1  0 0  ?Trouble concentrating 0 0  3 0  ?Moving slowly or fidgety/restless 0 0  2 0  ?Suicidal thoughts 0 0  0 0  ?PHQ-9 Score 0 '1  14 4  '$ ?Difficult doing work/chores Not  difficult at all Not difficult at all   Not difficult at all  ?Anhedonia: no ?Weight changes: no ?Insomnia: yes hard to fall asleep  ?Hypersomnia: no ?Fatigue/loss of energy: no ?Feelings of worthlessness: yes ?Feelings of guilt: yes ?Impaired concentration/indecisiveness: no ?Suicidal ideations: no  ?Crying spells: yes ?Recent Stressors/Life Changes: yes ?  Relationship problems: yes ?  Family stress: no   ?  Financial stress: no  ?  Job stress: no  ?  Recent death/loss: no ? ?  04/13/21  ? 11:56 AM 05/13/2020  ?  9:47 AM 12/17/2018  ?  2:30 PM  ?GAD 7 : Generalized Anxiety Score  ?Nervous, Anxious, on Edge 0 3 2  ?Control/stop worrying '1 3 2  '$ ?Worry too much - different things '1 3 2  '$ ?Trouble relaxing 0 3 2  ?Restless 0 2 2  ?Easily annoyed or irritable '2 3 2  '$ ?Afraid - awful might happen 0 1 0  ?Total GAD 7 Score '4 18 12  '$ ?Anxiety Difficulty Not difficult at all  Somewhat difficult  ? ?Relevant past medical, surgical, family and social history reviewed and updated as indicated. Interim medical history since our last visit reviewed. ?Allergies and medications reviewed and updated. ? ?Review of Systems  ?Constitutional:  Negative for activity change, appetite change, diaphoresis, fatigue and fever.  ?Respiratory:  Positive for cough (baseline). Negative for chest tightness, shortness of breath and wheezing.   ?Cardiovascular:  Negative for chest pain, palpitations and leg swelling.  ?Gastrointestinal: Negative.   ?Endocrine: Negative for polydipsia, polyphagia and polyuria.  ?Neurological: Negative.   ?Psychiatric/Behavioral: Negative.  Negative for sleep disturbance and suicidal ideas.   ? ?Per HPI unless specifically indicated above ? ?   ?Objective:  ?  ?BP 122/77   Pulse 71   Temp 98.2 ?F (36.8 ?C) (Oral)   Wt 210 lb 12.8 oz (95.6 kg)   SpO2 97%   BMI 33.02 kg/m?   ?Wt Readings from Last 3 Encounters:  ?06/29/21 210 lb 12.8 oz (95.6 kg)  ?06/10/21 202 lb (91.6 kg)  ?05/24/21 210 lb (95.3 kg)  ?  ?Physical  Exam ?Vitals and nursing note reviewed.  ?Constitutional:   ?   General: She is awake.  ?   Appearance: She is well-developed and well-groomed. She is obese.  ?HENT:  ?   Head: Normocephalic.  ?   Right Ear: Hearing, tympanic membrane, ear canal and external ear normal.  ?   Left Ear: Hearing, tympanic membrane, ear canal and external ear normal.  ?  Nose:  ?   Right Sinus: No maxillary sinus tenderness or frontal sinus tenderness.  ?   Left Sinus: No maxillary sinus tenderness or frontal sinus tenderness.  ?Eyes:  ?   General: Lids are normal.     ?   Right eye: No discharge.     ?   Left eye: No discharge.  ?   Conjunctiva/sclera: Conjunctivae normal.  ?   Pupils: Pupils are equal, round, and reactive to light.  ?Neck:  ?   Thyroid: No thyromegaly.  ?   Vascular: No carotid bruit.  ?Cardiovascular:  ?   Rate and Rhythm: Normal rate and regular rhythm.  ?   Heart sounds: Normal heart sounds. No murmur heard. ?  No gallop.  ?Pulmonary:  ?   Effort: Pulmonary effort is normal. No accessory muscle usage or respiratory distress.  ?   Breath sounds: Wheezing present.  ?   Comments: Scattered expiratory wheezes throughout noted - baseline. ?Abdominal:  ?   General: Bowel sounds are normal.  ?   Palpations: Abdomen is soft.  ?Musculoskeletal:  ?   Cervical back: Normal range of motion and neck supple.  ?   Right lower leg: No edema.  ?   Left lower leg: No edema.  ?Skin: ?   General: Skin is warm and dry.  ?   Comments: Scattered pale purple bruising bilateral upper extremities.  ?Neurological:  ?   Mental Status: She is alert and oriented to person, place, and time.  ?Psychiatric:     ?   Attention and Perception: Attention normal.     ?   Mood and Affect: Mood normal.     ?   Speech: Speech normal.     ?   Behavior: Behavior normal. Behavior is cooperative.     ?   Thought Content: Thought content normal.  ? ?Diabetic Foot Exam - Simple   ?Simple Foot Form ?Visual Inspection ?No deformities, no ulcerations, no other  skin breakdown bilaterally: Yes ?Sensation Testing ?Intact to touch and monofilament testing bilaterally: Yes ?Pulse Check ?Posterior Tibialis and Dorsalis pulse intact bilaterally: Yes ?Comments ?Continues to have blue toe

## 2021-06-29 NOTE — Assessment & Plan Note (Signed)
Chronic, stable with BP at goal.  Recommend she monitor BP at least a few mornings a week at home and document.  DASH diet at home.  Continue current medication regimen and adjust as needed.  Labs today.  Return in 3 months. ?

## 2021-06-29 NOTE — Patient Instructions (Signed)

## 2021-06-29 NOTE — Assessment & Plan Note (Signed)
Chronic, ongoing, recommend complete cessation of smoking, which would benefit GERD symptoms.  Continue PPI at this time, has not tolerated reductions.  Mag level annually. ?

## 2021-06-29 NOTE — Assessment & Plan Note (Signed)
Continue to collaborate with pulmonary. 

## 2021-06-29 NOTE — Assessment & Plan Note (Signed)
Chronic, stable -- she has discontinued a majority of medication and is using Celexa as needed only.   ?

## 2021-06-29 NOTE — Assessment & Plan Note (Signed)
Chronic, ongoing.  Continue Breztri daily and Albuterol as needed.  Recommend complete cessation of smoking.  Continue annual CT scans.  Continue collaboration with pulmonary, which is beneficial.   

## 2021-06-29 NOTE — Assessment & Plan Note (Signed)
Chronic, ongoing noted on lung CT screening.  Continue statin and ASA daily for prevention.  Continue collaboration with vascular.  Recommend complete cessation smoking. 

## 2021-06-29 NOTE — Assessment & Plan Note (Signed)
Noted past labs, continue supplement and recheck level today. 

## 2021-06-29 NOTE — Assessment & Plan Note (Signed)
BMI 33.02.  Recommended eating smaller high protein, low fat meals more frequently and exercising 30 mins a day 5 times a week with a goal of 10-15lb weight loss in the next 3 months. Patient voiced their understanding and motivation to adhere to these recommendations.  

## 2021-06-29 NOTE — Assessment & Plan Note (Signed)
Chronic, ongoing.  Continue collaboration with vascular.  Recommend she return for visit with them. 

## 2021-06-29 NOTE — Assessment & Plan Note (Signed)
Chronic, continue supplement and recheck level today.  Is on long term Metformin. 

## 2021-06-29 NOTE — Assessment & Plan Note (Signed)
Chronic, stable, followed by vascular.  Recommend she schedule follow-up with them. Continue statin and ASA daily. 

## 2021-06-29 NOTE — Assessment & Plan Note (Addendum)
Chronic, ongoing.  Highly recommend she continue Crestor and adjust dosing as needed or add on Zetia if poor control.  Lipid check next visit. ?

## 2021-06-29 NOTE — Assessment & Plan Note (Signed)
Chronic, ongoing with A1c downward trend 6.3%.  Urine ALB 30 and A:C <30 in March 2023.  Recommend she take Olmesartan for kidney protection.  Continue Metformin daily as ordered and will not restart Ozempic at this time.  Check BS BID.  Monitor diet at home and reduce carbs and foods high in sugar.  Continue to collaborate with vascular.  Return in 3 months. ?

## 2021-06-29 NOTE — Assessment & Plan Note (Signed)
I have recommended complete cessation of tobacco use. I have discussed various options available for assistance with tobacco cessation including over the counter methods (Nicotine gum, patch and lozenges). We also discussed prescription options (Chantix, Nicotine Inhaler / Nasal Spray). The patient is not interested in pursuing any prescription tobacco cessation options at this time.  Continue yearly lung screening.  

## 2021-06-29 NOTE — Assessment & Plan Note (Signed)
Bilateral upper extremity.  Recommend gentle skin cleansing daily and application of lotion daily.  Monitor skin and notify provider if any abrasions or wounds. 

## 2021-06-30 LAB — VITAMIN D 25 HYDROXY (VIT D DEFICIENCY, FRACTURES): Vit D, 25-Hydroxy: 34.5 ng/mL (ref 30.0–100.0)

## 2021-06-30 LAB — VITAMIN B12: Vitamin B-12: 457 pg/mL (ref 232–1245)

## 2021-06-30 NOTE — Telephone Encounter (Signed)
Requested Prescriptions  ?Pending Prescriptions Disp Refills  ?? citalopram (CELEXA) 20 MG tablet [Pharmacy Med Name: CITALOPRAM HYDROBROMIDE 20 MG TAB] 30 tablet 3  ?  Sig: TAKE 1 TABLET BY MOUTH DAILY  ?  ? Psychiatry:  Antidepressants - SSRI Passed - 06/29/2021  4:02 PM  ?  ?  Passed - Valid encounter within last 6 months  ?  Recent Outpatient Visits   ?      ? Yesterday Type 2 diabetes, controlled, with peripheral circulatory disorder ([place])  ? Z[place], [place] T, NP  ? 3 weeks ago COPD exacerbation ([place])  ? [place], [place] T, NP  ? 1 month ago Senile purpura ([place])  ? [facility] Mal[place], [place] T, NP  ? 2 months ago Nausea and vomiting, unspecified vomiting type  ? [facility] Vigg, Avanti, MD  ? 3 months ago Type 2 diabetes, controlled, with peripheral circulatory disorder ([place])  ? [facility] [place], Henrine Screws T, NP  ?  ?  ?Future Appointments   ?        ? In 3 months Cannady, Barbaraann Faster, NP [employer]  ?  ? ?  ?  ?  ? ? ?

## 2021-06-30 NOTE — Progress Notes (Signed)
Contacted via [place] ? ? ?Good morning Stacey Gross, your labs have returned and Vitamin D level is improving, continue supplement.  Also continue Vitamin B12 supplement daily.  Any questions? ?Keep being amazing!!  Thank you for allowing me to participate in your care.  I appreciate you. ?Kindest regards, ?Cece Milhouse ?

## 2021-07-05 ENCOUNTER — Other Ambulatory Visit: Payer: Self-pay | Admitting: *Deleted

## 2021-07-05 DIAGNOSIS — F17210 Nicotine dependence, cigarettes, uncomplicated: Secondary | ICD-10-CM

## 2021-07-05 DIAGNOSIS — Z87891 Personal history of nicotine dependence: Secondary | ICD-10-CM

## 2021-07-14 ENCOUNTER — Ambulatory Visit
Admission: RE | Admit: 2021-07-14 | Discharge: 2021-07-14 | Disposition: A | Discharge status: Home / Self Care | Payer: Medicare Other | Source: Ambulatory Visit | Attending: Acute Care | Admitting: Acute Care

## 2021-07-14 DIAGNOSIS — F17210 Nicotine dependence, cigarettes, uncomplicated: Secondary | ICD-10-CM | POA: Insufficient documentation

## 2021-07-14 DIAGNOSIS — Z87891 Personal history of nicotine dependence: Secondary | ICD-10-CM | POA: Diagnosis not present

## 2021-07-19 ENCOUNTER — Telehealth: Payer: Self-pay | Admitting: *Deleted

## 2021-07-19 DIAGNOSIS — Z87891 Personal history of nicotine dependence: Secondary | ICD-10-CM

## 2021-07-19 DIAGNOSIS — Z122 Encounter for screening for malignant neoplasm of respiratory organs: Secondary | ICD-10-CM

## 2021-07-19 DIAGNOSIS — F17210 Nicotine dependence, cigarettes, uncomplicated: Secondary | ICD-10-CM

## 2021-07-19 NOTE — Telephone Encounter (Signed)
Pt informed of CT results per Eric Form, NP.  PT verbalized understanding.  Copy of CT sent to PCP.  Order placed for 6 mth f/u CT. ? ?

## 2021-07-19 NOTE — Telephone Encounter (Signed)
Left message for pt to call back to discuss recent lung screening CT results. ?

## 2021-09-01 ENCOUNTER — Other Ambulatory Visit: Payer: Self-pay | Admitting: Nurse Practitioner

## 2021-09-02 NOTE — Telephone Encounter (Signed)
Discontinued 08/27/20. Not on current med profile Requested Prescriptions  Pending Prescriptions Disp Refills  . furosemide (LASIX) 20 MG tablet [Pharmacy Med Name: FUROSEMIDE 20 MG TAB] 30 tablet 0    Sig: TAKE 1/2 TABLET BY MOUTH DAILY     Cardiovascular:  Diuretics - Loop Passed - 09/01/2021  9:54 AM      Passed - K in normal range and within 180 days    Potassium  Date Value Ref Range Status  05/10/2021 5.0 3.5 - 5.2 mmol/L Final         Passed - Ca in normal range and within 180 days    Calcium  Date Value Ref Range Status  05/10/2021 9.5 8.7 - 10.3 mg/dL Final         Passed - Na in normal range and within 180 days    Sodium  Date Value Ref Range Status  05/10/2021 139 134 - 144 mmol/L Final         Passed - Cr in normal range and within 180 days    Creat  Date Value Ref Range Status  07/02/2013 0.79 0.50 - 1.10 mg/dL Final   Creatinine, Ser  Date Value Ref Range Status  05/10/2021 0.78 0.57 - 1.00 mg/dL Final         Passed - Cl in normal range and within 180 days    Chloride  Date Value Ref Range Status  05/10/2021 100 96 - 106 mmol/L Final         Passed - Mg Level in normal range and within 180 days    Magnesium  Date Value Ref Range Status  03/31/2021 1.8 1.6 - 2.3 mg/dL Final         Passed - Last BP in normal range    BP Readings from Last 1 Encounters:  06/29/21 122/77         Passed - Valid encounter within last 6 months    Recent Outpatient Visits          2 months ago Type 2 diabetes, controlled, with peripheral circulatory disorder ([place])   [place], Jolene T, NP   2 months ago COPD exacerbation ([place])   [place] Cannady, Jolene T, NP   3 months ago Senile purpura ([place])   [place], Jolene T, NP   4 months ago Nausea and vomiting, unspecified vomiting type   [facility] Vigg, Avanti, MD   5 months ago Type 2 diabetes, controlled, with peripheral circulatory  disorder ([place])   [place], Barbaraann Faster, NP      Future Appointments            In 3 weeks Cannady, Barbaraann Faster, NP [employer] [facility]

## 2021-09-14 ENCOUNTER — Ambulatory Visit: Payer: Medicare Other | Admitting: Podiatry

## 2021-09-14 ENCOUNTER — Encounter: Payer: Self-pay | Admitting: Podiatry

## 2021-09-14 ENCOUNTER — Ambulatory Visit (INDEPENDENT_AMBULATORY_CARE_PROVIDER_SITE_OTHER): Payer: Medicare Other

## 2021-09-14 DIAGNOSIS — L989 Disorder of the skin and subcutaneous tissue, unspecified: Secondary | ICD-10-CM

## 2021-09-14 DIAGNOSIS — M2041 Other hammer toe(s) (acquired), right foot: Secondary | ICD-10-CM

## 2021-09-14 NOTE — Progress Notes (Signed)
   Subjective: 61 year old female presenting today with a chief complaint of constant sharp, stabbing pain to the plantar left heel secondary to a callus lesion that has been present for the past 3-4 months. Walking increases the pain. She has not done anything for treatment. She states she has circulation issues and is treated by Dr. Lucky Cowboy. Patient is here for further evaluation and treatment.   Past Medical History:  Diagnosis Date   Asthma    Cancer ([place])    stomach   Chronic back pain    COPD (chronic obstructive pulmonary disease) (HCC)    Degenerative joint disease (DJD) of hip    Diabetes mellitus without complication (HCC)    Dyspnea    GERD (gastroesophageal reflux disease)    takes Omeprazole daily   History of bronchitis yrs ago   History of colon polyps    benign   History of staph infection 10 yrs ago   Hyperlipidemia    takes Atorvastatin daily   Hypertension    Palpitations    Peripheral vascular disease (HCC)    Tobacco use    8 cigarettes daily    Weakness    numbness and tingling in both legs    Wears dentures    full upper     Objective:  Physical Exam General: Alert and oriented x3 in no acute distress  Dermatology: Hyperkeratotic lesion present on the left heel. Pain on palpation with a central nucleated core noted.  Skin is warm, dry and supple bilateral lower extremities. Negative for open lesions or macerations.  Vascular: Palpable pedal pulses bilaterally. No edema or erythema noted. Capillary refill within normal limits.  Neurological: Epicritic and protective threshold diminished bilaterally.   Musculoskeletal Exam: Pain on palpation at the keratotic lesion noted. Range of motion within normal limits bilateral. Muscle strength 5/5 in all groups bilateral.  Radiographic Exam:  Normal osseous mineralization. Joint spaces preserved. No fracture/dislocation/boney destruction.    Assessment: #1 Diabetes mellitus w/ polyneuropathy #2  Porokeratotic callus lesion plantar heel left #3 PVD   Plan of Care:  #1 Patient evaluated. X-Rays reviewed.  #2 Excisional debridement of keratotic lesion using a chisel blade was performed without incident.  #3 Dressed area with light dressing. #4 Continue PVD management with Dr. Lucky Cowboy, Vascular.  #5 Recommended good shoe gear. Patient admits to walking barefoot on hardwood floors daily. Advise against going barefoot #6 Revitaderm urea 40% lotion dispensed at checkout.  #7 Patient is to return to the clinic PRN.    Edrick Kins, DPM [facility]  Dr. Edrick Kins, DPM    [address], [place] [address]                Office [phone]  Fax [phone]

## 2021-09-22 ENCOUNTER — Ambulatory Visit
Admission: RE | Admit: 2021-09-22 | Discharge: 2021-09-22 | Disposition: A | Discharge status: Home / Self Care | Payer: Medicare Other | Source: Ambulatory Visit | Attending: Nurse Practitioner | Admitting: Nurse Practitioner

## 2021-09-22 DIAGNOSIS — Z1231 Encounter for screening mammogram for malignant neoplasm of breast: Secondary | ICD-10-CM | POA: Diagnosis not present

## 2021-09-23 ENCOUNTER — Other Ambulatory Visit: Payer: Self-pay | Admitting: Nurse Practitioner

## 2021-09-23 DIAGNOSIS — R928 Other abnormal and inconclusive findings on diagnostic imaging of breast: Secondary | ICD-10-CM

## 2021-09-23 DIAGNOSIS — R2231 Localized swelling, mass and lump, right upper limb: Secondary | ICD-10-CM

## 2021-09-23 NOTE — Progress Notes (Signed)
Contacted via [place] morning Tura, looks like you need further imaging of the right armpit area due to abnormal findings there.  The breast center will reach out to you to schedule this. Have a good day and if any questions let me know.

## 2021-09-26 NOTE — Patient Instructions (Addendum)
Follow-up with Dr. Melvyn Novas at pulmonary  [phone] and Dr. Garen Lah cardiology [phone]  Diabetes Mellitus and Nutrition, Adult When you have diabetes, or diabetes mellitus, it is very important to have healthy eating habits because your blood sugar (glucose) levels are greatly affected by what you eat and drink. Eating healthy foods in the right amounts, at about the same times every day, can help you: Manage your blood glucose. Lower your risk of heart disease. Improve your blood pressure. Reach or maintain a healthy weight. What can affect my meal plan? Every person with diabetes is different, and each person has different needs for a meal plan. Your health care provider may recommend that you work with a dietitian to make a meal plan that is best for you. Your meal plan may vary depending on factors such as: The calories you need. The medicines you take. Your weight. Your blood glucose, blood pressure, and cholesterol levels. Your activity level. Other health conditions you have, such as heart or kidney disease. How do carbohydrates affect me? Carbohydrates, also called carbs, affect your blood glucose level more than any other type of food. Eating carbs raises the amount of glucose in your blood. It is important to know how many carbs you can safely have in each meal. This is different for every person. Your dietitian can help you calculate how many carbs you should have at each meal and for each snack. How does alcohol affect me? Alcohol can cause a decrease in blood glucose (hypoglycemia), especially if you use insulin or take certain diabetes medicines by mouth. Hypoglycemia can be a life-threatening condition. Symptoms of hypoglycemia, such as sleepiness, dizziness, and confusion, are similar to symptoms of having too much alcohol. Do not drink alcohol if: Your health care provider tells you not to drink. You are pregnant, may be pregnant, or are planning to become  pregnant. If you drink alcohol: Limit how much you have to: 0-1 drink a day for women. 0-2 drinks a day for men. Know how much alcohol is in your drink. In the [place] one drink equals one 12 oz bottle of beer (355 mL), one 5 oz glass of wine (148 mL), or one 1 oz glass of hard liquor (44 mL). Keep yourself hydrated with water, diet soda, or unsweetened iced tea. Keep in mind that regular soda, juice, and other mixers may contain a lot of sugar and must be counted as carbs. What are tips for following this plan?  Reading food labels Start by checking the serving size on the Nutrition Facts label of packaged foods and drinks. The number of calories and the amount of carbs, fats, and other nutrients listed on the label are based on one serving of the item. Many items contain more than one serving per package. Check the total grams (g) of carbs in one serving. Check the number of grams of saturated fats and trans fats in one serving. Choose foods that have a low amount or none of these fats. Check the number of milligrams (mg) of salt (sodium) in one serving. Most people should limit total sodium intake to less than 2,300 mg per day. Always check the nutrition information of foods labeled as "low-fat" or "nonfat." These foods may be higher in added sugar or refined carbs and should be avoided. Talk to your dietitian to identify your daily goals for nutrients listed on the label. Shopping Avoid buying canned, pre-made, or processed foods. These foods tend to be high in fat,  sodium, and added sugar. Shop around the outside edge of the grocery store. This is where you will most often find fresh fruits and vegetables, bulk grains, fresh meats, and fresh dairy products. Cooking Use low-heat cooking methods, such as baking, instead of high-heat cooking methods, such as deep frying. Cook using healthy oils, such as olive, canola, or sunflower oil. Avoid cooking with butter, cream, or high-fat meats. Meal  planning Eat meals and snacks regularly, preferably at the same times every day. Avoid going long periods of time without eating. Eat foods that are high in fiber, such as fresh fruits, vegetables, beans, and whole grains. Eat 4-6 oz (112-168 g) of lean protein each day, such as lean meat, chicken, fish, eggs, or tofu. One ounce (oz) (28 g) of lean protein is equal to: 1 oz (28 g) of meat, chicken, or fish. 1 egg.  cup (62 g) of tofu. Eat some foods each day that contain healthy fats, such as avocado, nuts, seeds, and fish. What foods should I eat? Fruits Berries. Apples. Oranges. Peaches. Apricots. Plums. Grapes. Mangoes. Papayas. Pomegranates. Kiwi. Cherries. Vegetables Leafy greens, including lettuce, spinach, kale, chard, collard greens, mustard greens, and cabbage. Beets. Cauliflower. Broccoli. Carrots. Green beans. Tomatoes. Peppers. Onions. Cucumbers. Brussels sprouts. Grains Whole grains, such as whole-wheat or whole-grain bread, crackers, tortillas, cereal, and pasta. Unsweetened oatmeal. Quinoa. Brown or wild rice. Meats and other proteins Seafood. Poultry without skin. Lean cuts of poultry and beef. Tofu. Nuts. Seeds. Dairy Low-fat or fat-free dairy products such as milk, yogurt, and cheese. The items listed above may not be a complete list of foods and beverages you can eat and drink. Contact a dietitian for more information. What foods should I avoid? Fruits Fruits canned with syrup. Vegetables Canned vegetables. Frozen vegetables with butter or cream sauce. Grains Refined white flour and flour products such as bread, pasta, snack foods, and cereals. Avoid all processed foods. Meats and other proteins Fatty cuts of meat. Poultry with skin. Breaded or fried meats. Processed meat. Avoid saturated fats. Dairy Full-fat yogurt, cheese, or milk. Beverages Sweetened drinks, such as soda or iced tea. The items listed above may not be a complete list of foods and beverages you  should avoid. Contact a dietitian for more information. Questions to ask a health care provider Do I need to meet with a certified diabetes care and education specialist? Do I need to meet with a dietitian? What number can I call if I have questions? When are the best times to check my blood glucose? Where to find more information: American Diabetes Association: diabetes.org Academy of Nutrition and Dietetics: eatright.[employer] of Diabetes and Digestive and Kidney Diseases: [employer] Association of Diabetes Care & Education Specialists: diabeteseducator.org Summary It is important to have healthy eating habits because your blood sugar (glucose) levels are greatly affected by what you eat and drink. It is important to use alcohol carefully. A healthy meal plan will help you manage your blood glucose and lower your risk of heart disease. Your health care provider may recommend that you work with a dietitian to make a meal plan that is best for you. This information is not intended to replace advice given to you by your health care provider. Make sure you discuss any questions you have with your health care provider. Document Revised: 10/23/2019 Document Reviewed: 10/23/2019 Elsevier Patient Education  [place].

## 2021-09-29 ENCOUNTER — Ambulatory Visit (INDEPENDENT_AMBULATORY_CARE_PROVIDER_SITE_OTHER): Payer: Medicare Other | Admitting: Nurse Practitioner

## 2021-09-29 ENCOUNTER — Encounter: Payer: Self-pay | Admitting: Nurse Practitioner

## 2021-09-29 VITALS — BP 114/77 | HR 80 | Temp 98.1°F | Ht 67.0 in | Wt 210.8 lb

## 2021-09-29 DIAGNOSIS — J9611 Chronic respiratory failure with hypoxia: Secondary | ICD-10-CM | POA: Diagnosis not present

## 2021-09-29 DIAGNOSIS — E559 Vitamin D deficiency, unspecified: Secondary | ICD-10-CM | POA: Diagnosis not present

## 2021-09-29 DIAGNOSIS — R0601 Orthopnea: Secondary | ICD-10-CM | POA: Diagnosis not present

## 2021-09-29 DIAGNOSIS — I700 Atherosclerosis of aorta: Secondary | ICD-10-CM

## 2021-09-29 DIAGNOSIS — J432 Centrilobular emphysema: Secondary | ICD-10-CM | POA: Diagnosis not present

## 2021-09-29 DIAGNOSIS — I75023 Atheroembolism of bilateral lower extremities: Secondary | ICD-10-CM

## 2021-09-29 DIAGNOSIS — E1151 Type 2 diabetes mellitus with diabetic peripheral angiopathy without gangrene: Secondary | ICD-10-CM | POA: Diagnosis not present

## 2021-09-29 DIAGNOSIS — F411 Generalized anxiety disorder: Secondary | ICD-10-CM

## 2021-09-29 DIAGNOSIS — E538 Deficiency of other specified B group vitamins: Secondary | ICD-10-CM

## 2021-09-29 DIAGNOSIS — I152 Hypertension secondary to endocrine disorders: Secondary | ICD-10-CM | POA: Diagnosis not present

## 2021-09-29 DIAGNOSIS — E1169 Type 2 diabetes mellitus with other specified complication: Secondary | ICD-10-CM | POA: Diagnosis not present

## 2021-09-29 DIAGNOSIS — F17219 Nicotine dependence, cigarettes, with unspecified nicotine-induced disorders: Secondary | ICD-10-CM

## 2021-09-29 DIAGNOSIS — E6609 Other obesity due to excess calories: Secondary | ICD-10-CM

## 2021-09-29 DIAGNOSIS — D692 Other nonthrombocytopenic purpura: Secondary | ICD-10-CM

## 2021-09-29 DIAGNOSIS — E785 Hyperlipidemia, unspecified: Secondary | ICD-10-CM | POA: Diagnosis not present

## 2021-09-29 DIAGNOSIS — I739 Peripheral vascular disease, unspecified: Secondary | ICD-10-CM

## 2021-09-29 DIAGNOSIS — J9612 Chronic respiratory failure with hypercapnia: Secondary | ICD-10-CM

## 2021-09-29 DIAGNOSIS — E1159 Type 2 diabetes mellitus with other circulatory complications: Secondary | ICD-10-CM

## 2021-09-29 DIAGNOSIS — K219 Gastro-esophageal reflux disease without esophagitis: Secondary | ICD-10-CM | POA: Diagnosis not present

## 2021-09-29 DIAGNOSIS — Z6833 Body mass index (BMI) 33.0-33.9, adult: Secondary | ICD-10-CM

## 2021-09-29 LAB — BAYER DCA HB A1C WAIVED: HB A1C (BAYER DCA - WAIVED): 7.5 % — ABNORMAL HIGH (ref 4.8–5.6)

## 2021-09-29 MED ORDER — VERAPAMIL HCL ER 120 MG PO TBCR
120.0000 mg | EXTENDED_RELEASE_TABLET | Freq: Every day | ORAL | 4 refills | Status: DC
Start: 2021-09-29 — End: 2022-04-15

## 2021-09-29 MED ORDER — BREZTRI AEROSPHERE 160-9-4.8 MCG/ACT IN AERO: 2.0000 | INHALATION_SPRAY | Freq: Two times a day (BID) | RESPIRATORY_TRACT | 4 refills | Status: DC

## 2021-09-29 MED ORDER — METFORMIN HCL 500 MG PO TABS: 1000.0000 mg | ORAL_TABLET | Freq: Two times a day (BID) | ORAL | 4 refills | Status: DC

## 2021-09-29 MED ORDER — PANTOPRAZOLE SODIUM 40 MG PO TBEC
40.0000 mg | DELAYED_RELEASE_TABLET | Freq: Every day | ORAL | 4 refills | Status: DC
Start: 2021-09-29 — End: 2022-12-08

## 2021-09-29 MED ORDER — CITALOPRAM HYDROBROMIDE 20 MG PO TABS: 20.0000 mg | ORAL_TABLET | Freq: Every day | ORAL | 4 refills | Status: DC

## 2021-09-29 MED ORDER — ROSUVASTATIN CALCIUM 20 MG PO TABS
20.0000 mg | ORAL_TABLET | Freq: Every day | ORAL | 4 refills | Status: DC
Start: 2021-09-29 — End: 2021-09-30

## 2021-09-29 MED ORDER — OLMESARTAN MEDOXOMIL 5 MG PO TABS
5.0000 mg | ORAL_TABLET | Freq: Every day | ORAL | 4 refills | Status: DC
Start: 2021-09-29 — End: 2022-10-25

## 2021-09-29 MED ORDER — DAPAGLIFLOZIN PROPANEDIOL 10 MG PO TABS: 10.0000 mg | ORAL_TABLET | Freq: Every day | ORAL | 12 refills | Status: DC

## 2021-09-29 MED ORDER — NYSTATIN 100000 UNIT/ML MT SUSP: 5.0000 mL | Freq: Four times a day (QID) | OROMUCOSAL | 0 refills | Status: DC

## 2021-09-29 NOTE — Assessment & Plan Note (Signed)
New onset with some edema to BLE on occasion and ongoing SOB.  Have highly recommended she return to pulmonary and cardiology ASAP for follow-up to further assess this.  Repeat echo ordered.  Obtain labs today.

## 2021-09-29 NOTE — Assessment & Plan Note (Signed)
Chronic, ongoing.  Highly recommend she continue Crestor and adjust dosing as needed or add on Zetia if poor control.  Lipid check today. 

## 2021-09-29 NOTE — Assessment & Plan Note (Signed)
Continue to collaborate with pulmonary.

## 2021-09-29 NOTE — Assessment & Plan Note (Signed)
Chronic, ongoing.  Continue collaboration with vascular.  Recommend she return for visit with them.

## 2021-09-29 NOTE — Assessment & Plan Note (Signed)
Chronic, stable.  Recommend she take the Celexa daily instead of as needed to achieve full affect and overall help mood, she agrees with this plan and will try.  Overall mood stable.  Denies SI/HI.

## 2021-09-29 NOTE — Assessment & Plan Note (Addendum)
Chronic, ongoing.  Continue Breztri daily and Albuterol as needed.  Recommend complete cessation of smoking.  Continue annual CT scans.  Continue collaboration with pulmonary, which is beneficial.  Recommend she schedule ASAP with them.

## 2021-09-29 NOTE — Assessment & Plan Note (Signed)
Chronic, ongoing noted on lung CT screening.  Continue statin and ASA daily for prevention.  Continue collaboration with vascular.  Recommend complete cessation smoking. 

## 2021-09-29 NOTE — Assessment & Plan Note (Signed)
Chronic, continue supplement and recheck level today.  Is on long term Metformin. 

## 2021-09-29 NOTE — Progress Notes (Signed)
BP 114/77   Pulse 80   Temp 98.1 F (36.7 C) (Oral)   Ht '5\' 7"'$  (1.702 m)   Wt 210 lb 12.8 oz (95.6 kg)   SpO2 94%   BMI 33.02 kg/m    Subjective:    Patient ID: Stacey Gross, female    DOB: Mar 17, 1961, 61 y.o.   MRN: [id number]  HPI: [name] is a 61 y.o. female  Chief Complaint  Patient presents with   Diabetes   Hyperlipidemia   Hypertension   Mood   COPD   Medication Refill    Patient is requesting on medications.    DIABETES A1c March 6.3%.  Continues on Metformin 1000 MG BID. Her B12 level remained low side last check, she is taking supplement.  Also continues on Vitamin D for history of low levels. Hypoglycemic episodes:no Polydipsia/polyuria: no Visual disturbance: no Chest pain: no Paresthesias: no Glucose Monitoring: yes             Accucheck frequency: every now and then             Fasting glucose: <130             Post prandial:             Evening:             Before meals: Taking Insulin?: no             Long acting insulin:             Short acting insulin: Blood Pressure Monitoring: not checking Retinal Examination: Up To Date -- having cataract surgery upcoming Foot Exam: Up to Date Pneumovax: Up to Date Influenza: Not Up to Date -- refuses Aspirin: no   GERD Continues on Protonix 40 MG daily. Last had EGD in March 2023. GERD control status: stable Satisfied with current treatment? yes Heartburn frequency: occasional Medication side effects: no  Medication compliance: stable Previous GERD medications: Dysphagia: no Odynophagia:  no Hematemesis: no Blood in stool: no EGD: yes    HYPERTENSION / HYPERLIPIDEMIA Was on Olmesartan 5 MG and Crestor 20 MG daily.   Followed by cardiology and last saw 10/02/20, continues on Verapamil started by them.  EF on echo June 2022 was 60-65%.  Has swelling to knee down on occasion, comes and goes -- is noticing some increased SOB with this (worse then baseline).  Occasionally has orthopnea,  sleeping on 3 pillows.  She is not scheduled to follow-up with cardiology.  Followed by vascular for her blue toe syndrome and last saw February 2021, to follow annually. Satisfied with current treatment? yes Duration of hypertension: chronic BP monitoring frequency: not checking BP range:  BP medication side effects: no Duration of hyperlipidemia: chronic Cholesterol medication side effects: no Cholesterol supplements: none Medication compliance: good compliance Aspirin: no Recent stressors: no Recurrent headaches: no Visual changes: no Palpitations: occasional Dyspnea: some worsening Chest pain: none Lower extremity edema: occasional Dizzy/lightheaded: no   COPD Continues to smoke < 1 PPD, not interested in quitting -- has been smoking since age 10.  Went for lung CT screening 07/14/21 == emphysema and aortic atherosclerosis -- she is to recheck in 6 months for new nodule right lower lobe.  Saw pulmonary with last visit 10/27/20 -- they recommended to continue Breztri as in Group D for symptoms and COPD - still getting occasional yeast infection in mouth with is.  Plus Albuterol as needed -- uses this 3 or more times a  day depending on what she is doing.   COPD status: stable Satisfied with current treatment?: yes Oxygen use: no Dyspnea frequency: occasional at baseline Cough frequency: at baseline Rescue inhaler frequency:  every day Limitation of activity: no Productive cough: yes over past two weeks Last Spirometry: with pulmonary Pneumovax: Up to Date Influenza: Refuses  ANXIETY/STRESS Currently only taking Celexa as needed. Duration:stable Anxious mood: yes  Excessive worrying: yes Irritability: yes  Sweating: no Nausea: no Palpitations:no Hyperventilation: no Panic attacks: no Agoraphobia: no  Obscessions/compulsions: no Depressed mood: no    05/17/2021   11:44 AM 04-22-2021   11:56 AM 05/15/2020   11:17 AM 05/13/2020    9:23 AM 01/07/2020    9:38 AM   Depression screen PHQ 2/9  Decreased Interest 0 0 0 3 0  Down, Depressed, Hopeless 0 0 0 0 0  PHQ - 2 Score 0 0 0 3 0  Altered sleeping 0 0  3 1  Tired, decreased energy 0 0  3 3  Change in appetite 0 0  0 0  Feeling bad or failure about yourself  0 1  0 0  Trouble concentrating 0 0  3 0  Moving slowly or fidgety/restless 0 0  2 0  Suicidal thoughts 0 0  0 0  PHQ-9 Score 0 '1  14 4  '$ Difficult doing work/chores Not difficult at all Not difficult at all   Not difficult at all  Anhedonia: no Weight changes: no Insomnia: yes hard to fall asleep  Hypersomnia: no Fatigue/loss of energy: no Feelings of worthlessness: yes Feelings of guilt: yes Impaired concentration/indecisiveness: no Suicidal ideations: no  Crying spells: yes Recent Stressors/Life Changes: yes   Relationship problems: yes   Family stress: no     Financial stress: no    Job stress: no    Recent death/loss: no    2021-04-22   11:56 AM 05/13/2020    9:47 AM 12/17/2018    2:30 PM  GAD 7 : Generalized Anxiety Score  Nervous, Anxious, on Edge 0 3 2  Control/stop worrying '1 3 2  '$ Worry too much - different things '1 3 2  '$ Trouble relaxing 0 3 2  Restless 0 2 2  Easily annoyed or irritable '2 3 2  '$ Afraid - awful might happen 0 1 0  Total GAD 7 Score '4 18 12  '$ Anxiety Difficulty Not difficult at all  Somewhat difficult   Relevant past medical, surgical, family and social history reviewed and updated as indicated. Interim medical history since our last visit reviewed. Allergies and medications reviewed and updated.  Review of Systems  Constitutional:  Negative for activity change, appetite change, diaphoresis, fatigue and fever.  Respiratory:  Positive for cough (baseline) and shortness of breath. Negative for chest tightness and wheezing.   Cardiovascular:  Positive for leg swelling (occasional). Negative for chest pain and palpitations.  Gastrointestinal: Negative.   Endocrine: Negative for polydipsia, polyphagia and  polyuria.  Neurological: Negative.   Psychiatric/Behavioral: Negative.  Negative for sleep disturbance and suicidal ideas.     Per HPI unless specifically indicated above     Objective:    BP 114/77   Pulse 80   Temp 98.1 F (36.7 C) (Oral)   Ht '5\' 7"'$  (1.702 m)   Wt 210 lb 12.8 oz (95.6 kg)   SpO2 94%   BMI 33.02 kg/m   Wt Readings from Last 3 Encounters:  09/29/21 210 lb 12.8 oz (95.6 kg)  06/29/21 210 lb 12.8 oz (95.6  kg)  06/10/21 202 lb (91.6 kg)    Physical Exam Vitals and nursing note reviewed.  Constitutional:      General: She is awake.     Appearance: She is well-developed and well-groomed. She is obese.  HENT:     Head: Normocephalic.     Right Ear: Hearing and external ear normal.     Left Ear: Hearing and external ear normal.     Nose:     Right Sinus: No maxillary sinus tenderness or frontal sinus tenderness.     Left Sinus: No maxillary sinus tenderness or frontal sinus tenderness.  Eyes:     General: Lids are normal.        Right eye: No discharge.        Left eye: No discharge.     Conjunctiva/sclera: Conjunctivae normal.     Pupils: Pupils are equal, round, and reactive to light.  Neck:     Thyroid: No thyromegaly.     Vascular: No carotid bruit.  Cardiovascular:     Rate and Rhythm: Normal rate and regular rhythm.     Heart sounds: Normal heart sounds. No murmur heard.    No gallop.  Pulmonary:     Effort: Pulmonary effort is normal. No accessory muscle usage or respiratory distress.     Breath sounds: Wheezing present.     Comments: Scattered expiratory wheezes throughout noted - baseline. Abdominal:     General: Bowel sounds are normal.     Palpations: Abdomen is soft.  Musculoskeletal:     Cervical back: Normal range of motion and neck supple.     Right lower leg: No edema.     Left lower leg: No edema.  Skin:    General: Skin is warm and dry.     Comments: Scattered pale purple bruising bilateral upper extremities.  Neurological:      Mental Status: She is alert and oriented to person, place, and time.  Psychiatric:        Attention and Perception: Attention normal.        Mood and Affect: Mood normal.        Speech: Speech normal.        Behavior: Behavior normal. Behavior is cooperative.        Thought Content: Thought content normal.    Results for orders placed or performed in visit on 09/29/21  Bayer DCA Hb A1c Waived  Result Value Ref Range   HB A1C (BAYER DCA - WAIVED) 7.5 (H) 4.8 - 5.6 %      Assessment & Plan:   Problem List Items Addressed This Visit       Cardiovascular and Mediastinum   Aortic atherosclerosis (HCC)    Chronic, ongoing noted on lung CT screening.  Continue statin and ASA daily for prevention.  Continue collaboration with vascular.  Recommend complete cessation smoking.      Relevant Medications   verapamil (CALAN-SR) 120 MG CR tablet   rosuvastatin (CRESTOR) 20 MG tablet   olmesartan (BENICAR) 5 MG tablet   Other Relevant Orders   Comprehensive metabolic panel   Lipid Panel w/o Chol/HDL Ratio   Blue toe syndrome (HCC)    Chronic, ongoing.  Continue collaboration with vascular.  Recommend she return for visit with them.      Relevant Medications   verapamil (CALAN-SR) 120 MG CR tablet   rosuvastatin (CRESTOR) 20 MG tablet   olmesartan (BENICAR) 5 MG tablet   Other Relevant Orders   Comprehensive metabolic  panel   Lipid Panel w/o Chol/HDL Ratio   Hypertension associated with diabetes (HCC)    Chronic, stable with BP at goal in office today.  Recommend she monitor BP at least a few mornings a week at home and document.  DASH diet at home.  Continue current medication regimen and adjust as needed.  Labs today: CBC, CMP, TSH.  Return in 3 months.      Relevant Medications   verapamil (CALAN-SR) 120 MG CR tablet   rosuvastatin (CRESTOR) 20 MG tablet   olmesartan (BENICAR) 5 MG tablet   metFORMIN (GLUCOPHAGE) 500 MG tablet   dapagliflozin propanediol (FARXIGA) 10 MG TABS  tablet   Other Relevant Orders   Bayer DCA Hb A1c Waived (Completed)   CBC with Differential/Platelet   Comprehensive metabolic panel   TSH   ECHOCARDIOGRAM COMPLETE   Senile purpura (HCC)    Bilateral upper extremity.  Recommend gentle skin cleansing daily and application of lotion daily.  Monitor skin and notify provider if any abrasions or wounds.      Relevant Medications   verapamil (CALAN-SR) 120 MG CR tablet   rosuvastatin (CRESTOR) 20 MG tablet   olmesartan (BENICAR) 5 MG tablet   Other Relevant Orders   CBC with Differential/Platelet   Type 2 diabetes, controlled, with peripheral circulatory disorder ([place]) - Primary    Chronic, ongoing with A1c upward trend at 7.5% today, previous was 6.3%.  Urine ALB 30 and A:C <30 in March 2023.  Recommend she take Olmesartan daily for kidney protection.  Continue Metformin as ordered and add on Farxiga 10 MG daily, educated her on this and the benefit to kidney and heart health.  Check BS BID.  Monitor diet at home and reduce carbs and foods high in sugar.  Continue to collaborate with vascular.  Return in 4 weeks.      Relevant Medications   verapamil (CALAN-SR) 120 MG CR tablet   rosuvastatin (CRESTOR) 20 MG tablet   olmesartan (BENICAR) 5 MG tablet   metFORMIN (GLUCOPHAGE) 500 MG tablet   dapagliflozin propanediol (FARXIGA) 10 MG TABS tablet   Other Relevant Orders   Bayer DCA Hb A1c Waived (Completed)   Comprehensive metabolic panel     Respiratory   Centrilobular emphysema (HCC)    Chronic, ongoing.  Continue Breztri daily and Albuterol as needed.  Recommend complete cessation of smoking.  Continue annual CT scans.  Continue collaboration with pulmonary, which is beneficial.  Recommend she schedule ASAP with them.      Relevant Medications   Budeson-Glycopyrrol-Formoterol (BREZTRI AEROSPHERE) 160-9-4.8 MCG/ACT AERO   Other Relevant Orders   CBC with Differential/Platelet   Chronic respiratory failure with hypoxia and  hypercapnia (HCC)    Continue to collaborate with pulmonary.      Relevant Orders   CBC with Differential/Platelet     Digestive   GERD (gastroesophageal reflux disease)    Chronic, ongoing, recommend complete cessation of smoking, which would benefit GERD symptoms.  Continue PPI at this time, has not tolerated reductions.  Mag level annually. Risks of PPI use were discussed with patient including bone loss, C. Diff diarrhea, pneumonia, infections, CKD, electrolyte abnormalities.  Verbalizes understanding and chooses to continue the medication.       Relevant Medications   pantoprazole (PROTONIX) 40 MG tablet   Other Relevant Orders   Magnesium     Endocrine   Hyperlipidemia associated with type 2 diabetes mellitus (HCC)    Chronic, ongoing.  Highly recommend she continue Crestor  and adjust dosing as needed or add on Zetia if poor control.  Lipid check today.      Relevant Medications   verapamil (CALAN-SR) 120 MG CR tablet   rosuvastatin (CRESTOR) 20 MG tablet   olmesartan (BENICAR) 5 MG tablet   metFORMIN (GLUCOPHAGE) 500 MG tablet   dapagliflozin propanediol (FARXIGA) 10 MG TABS tablet   Other Relevant Orders   Bayer DCA Hb A1c Waived (Completed)   Comprehensive metabolic panel   Lipid Panel w/o Chol/HDL Ratio     Nervous and Auditory   Nicotine dependence, cigarettes, w unsp disorders    I have recommended complete cessation of tobacco use. I have discussed various options available for assistance with tobacco cessation including over the counter methods (Nicotine gum, patch and lozenges). We also discussed prescription options (Chantix, Nicotine Inhaler / Nasal Spray). The patient is not interested in pursuing any prescription tobacco cessation options at this time.  Continue yearly lung screening.         Other   B12 deficiency    Chronic, continue supplement and recheck level today.  Is on long term Metformin.      Relevant Orders   Vitamin B12   Generalized  anxiety disorder    Chronic, stable.  Recommend she take the Celexa daily instead of as needed to achieve full affect and overall help mood, she agrees with this plan and will try.  Overall mood stable.  Denies SI/HI.      Relevant Medications   citalopram (CELEXA) 20 MG tablet   Obesity    BMI 33.02.  Recommended eating smaller high protein, low fat meals more frequently and exercising 30 mins a day 5 times a week with a goal of 10-15lb weight loss in the next 3 months. Patient voiced their understanding and motivation to adhere to these recommendations.       Relevant Medications   metFORMIN (GLUCOPHAGE) 500 MG tablet   dapagliflozin propanediol (FARXIGA) 10 MG TABS tablet   Orthopnea    New onset with some edema to BLE on occasion and ongoing SOB.  Have highly recommended she return to pulmonary and cardiology ASAP for follow-up to further assess this.  Repeat echo ordered.  Obtain labs today.        Relevant Orders   ECHOCARDIOGRAM COMPLETE   Vitamin D deficiency    Noted past labs, continue supplement and recheck level today.      Relevant Orders   VITAMIN D 25 Hydroxy (Vit-D Deficiency, Fractures)      Follow up plan: Return in about 4 weeks (around 10/27/2021) for T2DM for adding [place] and then 3 months for chronic disease visit.

## 2021-09-29 NOTE — Assessment & Plan Note (Signed)
Chronic, stable with BP at goal in office today.  Recommend she monitor BP at least a few mornings a week at home and document.  DASH diet at home.  Continue current medication regimen and adjust as needed.  Labs today: CBC, CMP, TSH.  Return in 3 months.

## 2021-09-29 NOTE — Assessment & Plan Note (Signed)
Bilateral upper extremity.  Recommend gentle skin cleansing daily and application of lotion daily.  Monitor skin and notify provider if any abrasions or wounds. 

## 2021-09-29 NOTE — Assessment & Plan Note (Signed)
Noted past labs, continue supplement and recheck level today. 

## 2021-09-29 NOTE — Assessment & Plan Note (Signed)
BMI 33.02.  Recommended eating smaller high protein, low fat meals more frequently and exercising 30 mins a day 5 times a week with a goal of 10-15lb weight loss in the next 3 months. Patient voiced their understanding and motivation to adhere to these recommendations.

## 2021-09-29 NOTE — Assessment & Plan Note (Signed)
Chronic, ongoing, recommend complete cessation of smoking, which would benefit GERD symptoms.  Continue PPI at this time, has not tolerated reductions.  Mag level annually. Risks of PPI use were discussed with patient including bone loss, C. Diff diarrhea, pneumonia, infections, CKD, electrolyte abnormalities.  Verbalizes understanding and chooses to continue the medication.  

## 2021-09-29 NOTE — Assessment & Plan Note (Addendum)
Chronic, ongoing with A1c upward trend at 7.5% today, previous was 6.3%.  Urine ALB 30 and A:C <30 in March 2023.  Recommend she take Olmesartan daily for kidney protection.  Continue Metformin as ordered and add on Farxiga 10 MG daily, educated her on this and the benefit to kidney and heart health.  Check BS BID.  Monitor diet at home and reduce carbs and foods high in sugar.  Continue to collaborate with vascular.  Return in 4 weeks.

## 2021-09-29 NOTE — Assessment & Plan Note (Signed)
I have recommended complete cessation of tobacco use. I have discussed various options available for assistance with tobacco cessation including over the counter methods (Nicotine gum, patch and lozenges). We also discussed prescription options (Chantix, Nicotine Inhaler / Nasal Spray). The patient is not interested in pursuing any prescription tobacco cessation options at this time.  Continue yearly lung screening.  

## 2021-09-30 ENCOUNTER — Other Ambulatory Visit: Payer: Self-pay | Admitting: Nurse Practitioner

## 2021-09-30 LAB — VITAMIN B12: Vitamin B-12: 694 pg/mL (ref 232–1245)

## 2021-09-30 LAB — CBC WITH DIFFERENTIAL/PLATELET
Basophils Absolute: 0.1 10*3/uL (ref 0.0–0.2)
Basos: 1 %
EOS (ABSOLUTE): 0.1 10*3/uL (ref 0.0–0.4)
Eos: 1 %
Hematocrit: 45.1 % (ref 34.0–46.6)
Hemoglobin: 15.2 g/dL (ref 11.1–15.9)
Immature Grans (Abs): 0.1 10*3/uL (ref 0.0–0.1)
Immature Granulocytes: 1 %
Lymphocytes Absolute: 2.6 10*3/uL (ref 0.7–3.1)
Lymphs: 26 %
MCH: 30.3 pg (ref 26.6–33.0)
MCHC: 33.7 g/dL (ref 31.5–35.7)
MCV: 90 fL (ref 79–97)
Monocytes Absolute: 0.6 10*3/uL (ref 0.1–0.9)
Monocytes: 6 %
Neutrophils Absolute: 6.7 10*3/uL (ref 1.4–7.0)
Neutrophils: 65 %
Platelets: 315 10*3/uL (ref 150–450)
RBC: 5.02 x10E6/uL (ref 3.77–5.28)
RDW: 11.9 % (ref 11.7–15.4)
WBC: 10.3 10*3/uL (ref 3.4–10.8)

## 2021-09-30 LAB — COMPREHENSIVE METABOLIC PANEL
ALT: 26 IU/L (ref 0–32)
AST: 33 IU/L (ref 0–40)
Albumin/Globulin Ratio: 2 (ref 1.2–2.2)
Albumin: 4.5 g/dL (ref 3.8–4.8)
Alkaline Phosphatase: 106 IU/L (ref 44–121)
BUN/Creatinine Ratio: 16 (ref 12–28)
BUN: 14 mg/dL (ref 8–27)
Bilirubin Total: 0.4 mg/dL (ref 0.0–1.2)
CO2: 23 mmol/L (ref 20–29)
Calcium: 9.8 mg/dL (ref 8.7–10.3)
Chloride: 95 mmol/L — ABNORMAL LOW (ref 96–106)
Creatinine, Ser: 0.89 mg/dL (ref 0.57–1.00)
Globulin, Total: 2.2 g/dL (ref 1.5–4.5)
Glucose: 141 mg/dL — ABNORMAL HIGH (ref 70–99)
Potassium: 4.9 mmol/L (ref 3.5–5.2)
Sodium: 134 mmol/L (ref 134–144)
Total Protein: 6.7 g/dL (ref 6.0–8.5)
eGFR: 73 mL/min/{1.73_m2} (ref >59)

## 2021-09-30 LAB — MAGNESIUM: Magnesium: 1.8 mg/dL (ref 1.6–2.3)

## 2021-09-30 LAB — TSH: TSH: 1.18 u[IU]/mL (ref 0.450–4.500)

## 2021-09-30 LAB — LIPID PANEL W/O CHOL/HDL RATIO
Cholesterol, Total: 185 mg/dL (ref 100–199)
HDL: 39 mg/dL — ABNORMAL LOW (ref >39)
LDL Chol Calc (NIH): 111 mg/dL — ABNORMAL HIGH (ref 0–99)
Triglycerides: 198 mg/dL — ABNORMAL HIGH (ref 0–149)
VLDL Cholesterol Cal: 35 mg/dL (ref 5–40)

## 2021-09-30 LAB — VITAMIN D 25 HYDROXY (VIT D DEFICIENCY, FRACTURES): Vit D, 25-Hydroxy: 35.5 ng/mL (ref 30.0–100.0)

## 2021-09-30 MED ORDER — ROSUVASTATIN CALCIUM 40 MG PO TABS: 40.0000 mg | ORAL_TABLET | Freq: Every day | ORAL | 3 refills | Status: DC

## 2021-09-30 NOTE — Progress Notes (Signed)
Contacted via [place] afternoon [place], your labs have returned and overall everything remains stable with exception of cholesterol where LDL remains elevated, would like to see below 70.  I would like to increase your Rosuvastatin to 40 MG nightly, please start this as I have sent in.  Kidney function, creatinine and eGFR, remains normal, as is liver function, AST and ALT.  Any questions? Keep being amazing!!  Thank you for allowing me to participate in your care.  I appreciate you. Kindest regards, Dezire Turk

## 2021-10-04 ENCOUNTER — Ambulatory Visit
Admission: RE | Admit: 2021-10-04 | Discharge: 2021-10-04 | Disposition: A | Discharge status: Home / Self Care | Payer: Medicare Other | Source: Ambulatory Visit | Attending: Nurse Practitioner | Admitting: Nurse Practitioner

## 2021-10-04 DIAGNOSIS — Z0389 Encounter for observation for other suspected diseases and conditions ruled out: Secondary | ICD-10-CM | POA: Diagnosis not present

## 2021-10-04 DIAGNOSIS — R2231 Localized swelling, mass and lump, right upper limb: Secondary | ICD-10-CM | POA: Diagnosis not present

## 2021-10-04 DIAGNOSIS — R928 Other abnormal and inconclusive findings on diagnostic imaging of breast: Secondary | ICD-10-CM | POA: Insufficient documentation

## 2021-10-06 ENCOUNTER — Other Ambulatory Visit: Payer: Self-pay | Admitting: Nurse Practitioner

## 2021-10-07 ENCOUNTER — Other Ambulatory Visit: Payer: Self-pay | Admitting: Nurse Practitioner

## 2021-10-07 DIAGNOSIS — R0601 Orthopnea: Secondary | ICD-10-CM

## 2021-10-07 DIAGNOSIS — R0600 Dyspnea, unspecified: Secondary | ICD-10-CM

## 2021-10-07 NOTE — Telephone Encounter (Signed)
Requested medications are due for refill today.  unsure  Requested medications are on the active medications list.  no  Last refill. 01/18/2021  Future visit scheduled.   yes  Notes to clinic.  Med not on med list.    Requested Prescriptions  Pending Prescriptions Disp Refills   traZODone (DESYREL) 100 MG tablet [Pharmacy Med Name: TRAZODONE HCL 100 MG TAB] 90 tablet     Sig: TAKE 1 TABLET BY MOUTH AT BEDTIME AS NEEDED FOR SLEEP     Psychiatry: Antidepressants - Serotonin Modulator Passed - 10/06/2021 10:26 AM      Passed - Valid encounter within last 6 months    Recent Outpatient Visits           1 week ago Type 2 diabetes, controlled, with peripheral circulatory disorder ([place])   [place] Cannady, Jolene T, NP   3 months ago Type 2 diabetes, controlled, with peripheral circulatory disorder ([place])   [place] Cannady, Jolene T, NP   4 months ago COPD exacerbation ([place])   [place] Cannady, Jolene T, NP   5 months ago Senile purpura ([place])   [place] Cannady, Jolene T, NP   5 months ago Nausea and vomiting, unspecified vomiting type   [place] Vigg, Avanti, MD       Future Appointments             In 1 week Cannady, Barbaraann Faster, NP [facility], [place]   In 2 months [place], Barbaraann Faster, NP [facility], [facility]

## 2021-10-16 NOTE — Patient Instructions (Signed)

## 2021-10-18 DIAGNOSIS — H25043 Posterior subcapsular polar age-related cataract, bilateral: Secondary | ICD-10-CM | POA: Diagnosis not present

## 2021-10-18 DIAGNOSIS — H353132 Nonexudative age-related macular degeneration, bilateral, intermediate dry stage: Secondary | ICD-10-CM | POA: Diagnosis not present

## 2021-10-18 DIAGNOSIS — H35033 Hypertensive retinopathy, bilateral: Secondary | ICD-10-CM | POA: Diagnosis not present

## 2021-10-18 DIAGNOSIS — E119 Type 2 diabetes mellitus without complications: Secondary | ICD-10-CM | POA: Diagnosis not present

## 2021-10-18 DIAGNOSIS — H2511 Age-related nuclear cataract, right eye: Secondary | ICD-10-CM | POA: Diagnosis not present

## 2021-10-18 DIAGNOSIS — H2513 Age-related nuclear cataract, bilateral: Secondary | ICD-10-CM | POA: Diagnosis not present

## 2021-10-18 DIAGNOSIS — H524 Presbyopia: Secondary | ICD-10-CM | POA: Diagnosis not present

## 2021-10-18 DIAGNOSIS — H52213 Irregular astigmatism, bilateral: Secondary | ICD-10-CM | POA: Diagnosis not present

## 2021-10-18 DIAGNOSIS — H25013 Cortical age-related cataract, bilateral: Secondary | ICD-10-CM | POA: Diagnosis not present

## 2021-10-18 LAB — HM DIABETES EYE EXAM

## 2021-10-20 ENCOUNTER — Encounter: Payer: Self-pay | Admitting: Nurse Practitioner

## 2021-10-20 ENCOUNTER — Ambulatory Visit (INDEPENDENT_AMBULATORY_CARE_PROVIDER_SITE_OTHER): Payer: Medicare Other | Admitting: Nurse Practitioner

## 2021-10-20 ENCOUNTER — Ambulatory Visit (INDEPENDENT_AMBULATORY_CARE_PROVIDER_SITE_OTHER): Payer: Medicare Other

## 2021-10-20 DIAGNOSIS — R0600 Dyspnea, unspecified: Secondary | ICD-10-CM

## 2021-10-20 DIAGNOSIS — E1151 Type 2 diabetes mellitus with diabetic peripheral angiopathy without gangrene: Secondary | ICD-10-CM | POA: Diagnosis not present

## 2021-10-20 DIAGNOSIS — R0601 Orthopnea: Secondary | ICD-10-CM | POA: Diagnosis not present

## 2021-10-20 LAB — ECHOCARDIOGRAM COMPLETE
AR max vel: 4.92 cm2
AV Area VTI: 5.68 cm2
AV Area mean vel: 5.74 cm2
AV Mean grad: 2 mmHg
AV Peak grad: 4.8 mmHg
Ao pk vel: 1.1 m/s
Area-P 1/2: 4.01 cm2
Height: 67 in
S' Lateral: 4.1 cm
Weight: 3366.4 oz

## 2021-10-20 MED ORDER — SITAGLIPTIN PHOSPHATE 100 MG PO TABS: 100.0000 mg | ORAL_TABLET | Freq: Every day | ORAL | 4 refills | Status: DC

## 2021-10-20 MED ORDER — PERFLUTREN LIPID MICROSPHERE
1.0000 mL | INTRAVENOUS | Status: AC | PRN
  Administered 2021-10-20: 2 mL via INTRAVENOUS

## 2021-10-20 MED ORDER — HYDROXYZINE PAMOATE 25 MG PO CAPS: 25.0000 mg | ORAL_CAPSULE | Freq: Three times a day (TID) | ORAL | 6 refills | Status: DC | PRN

## 2021-10-20 NOTE — Assessment & Plan Note (Signed)
Chronic, ongoing with A1c 7.5% in June, previous was 6.3%.  Urine ALB 30 and A:C <30 in March 2023.  Recommend she take Olmesartan daily for kidney protection.  Continue Farxiga 10 MG daily and stop Metformin -- start Januvia 100 MG daily (did not tolerate GLP1 in past), educated her on this and the benefits + action.  Check BS BID.  Monitor diet at home and reduce carbs and foods high in sugar.  Continue to collaborate with vascular.  Return in 2 months.

## 2021-10-20 NOTE — Progress Notes (Signed)
Please call and check to ensure patient received below message as she does not always check MyChart: Good day Stacey Gross, your echo has returned and it is showing some changes from last year. The pump of the heart has gone from normal to a low normal level, slight decrease in ejection fraction from 60-65% to 50-55%.  There is also some thickening of the right ventricle of heart.  Due to these changes and your shortness of breath I do recommend you schedule a follow-up with cardiology (heart doctor).  You last saw them July 2022, so you will be able to call and schedule follow-up.  Call  [phone].  Any questions? Keep being awesome!!  Thank you for allowing me to participate in your care.  I appreciate you. Kindest regards, Vander Kueker

## 2021-10-20 NOTE — Progress Notes (Signed)
BP 133/76   Pulse 68   Temp 98.2 F (36.8 C) (Oral)   Ht '5\' 7"'$  (1.702 m)   Wt 210 lb 6.4 oz (95.4 kg)   SpO2 93%   BMI 32.95 kg/m    Subjective:    Patient ID: Stacey Gross, female    DOB: 1960/05/07, 61 y.o.   MRN: [id number]  HPI: Stacey Gross is a 61 y.o. female  Chief Complaint  Patient presents with   Diabetes    Patient is here for a four week follow up on Diabetes with the start of Farxiga. Patient says she has also stopped Metformin, as she says it will keeps her in the bathroom with Diarrhea.    DIABETES Last A1c 7.5% in June 2023, started her on Farxiga.  She has stopped Metformin, only takes when she will be home due to diarrhea -- has awful diarrhea with this.  Needs refill on itch pills. Hypoglycemic episodes:no Polydipsia/polyuria: no Visual disturbance: no Chest pain: no Paresthesias: no Glucose Monitoring: yes  Accucheck frequency: twice a week  Fasting glucose: >120 , but less then 200 -- often 97 to 101  Post prandial:  Evening:  Before meals: Taking Insulin?: no  Long acting insulin:  Short acting insulin: Blood Pressure Monitoring: rarely Retinal Examination: Up to Date Foot Exam: Up to Date Pneumovax: Up to Date Influenza: Up to Date Aspirin: no   Relevant past medical, surgical, family and social history reviewed and updated as indicated. Interim medical history since our last visit reviewed. Allergies and medications reviewed and updated.  Review of Systems  Constitutional:  Negative for activity change, appetite change, diaphoresis, fatigue and fever.  Respiratory:  Positive for cough (baseline) and shortness of breath. Negative for chest tightness and wheezing.   Cardiovascular:  Positive for leg swelling (occasional). Negative for chest pain and palpitations.  Gastrointestinal: Negative.   Endocrine: Negative for polydipsia, polyphagia and polyuria.  Neurological: Negative.   Psychiatric/Behavioral: Negative.  Negative for sleep  disturbance and suicidal ideas.     Per HPI unless specifically indicated above     Objective:    BP 133/76   Pulse 68   Temp 98.2 F (36.8 C) (Oral)   Ht '5\' 7"'$  (1.702 m)   Wt 210 lb 6.4 oz (95.4 kg)   SpO2 93%   BMI 32.95 kg/m   Wt Readings from Last 3 Encounters:  10/20/21 210 lb 6.4 oz (95.4 kg)  09/29/21 210 lb 12.8 oz (95.6 kg)  06/29/21 210 lb 12.8 oz (95.6 kg)    Physical Exam Vitals and nursing note reviewed.  Constitutional:      General: She is awake.     Appearance: She is well-developed and well-groomed. She is obese.  HENT:     Head: Normocephalic.     Right Ear: Hearing and external ear normal.     Left Ear: Hearing and external ear normal.     Nose:     Right Sinus: No maxillary sinus tenderness or frontal sinus tenderness.     Left Sinus: No maxillary sinus tenderness or frontal sinus tenderness.  Eyes:     General: Lids are normal.        Right eye: No discharge.        Left eye: No discharge.     Conjunctiva/sclera: Conjunctivae normal.     Pupils: Pupils are equal, round, and reactive to light.  Neck:     Thyroid: No thyromegaly.     Vascular: No  carotid bruit.  Cardiovascular:     Rate and Rhythm: Normal rate and regular rhythm.     Heart sounds: Normal heart sounds. No murmur heard.    No gallop.  Pulmonary:     Effort: Pulmonary effort is normal. No accessory muscle usage or respiratory distress.     Breath sounds: Wheezing present.     Comments: Scattered expiratory wheezes throughout noted - baseline. Abdominal:     General: Bowel sounds are normal.     Palpations: Abdomen is soft.  Musculoskeletal:     Cervical back: Normal range of motion and neck supple.     Right lower leg: No edema.     Left lower leg: No edema.  Skin:    General: Skin is warm and dry.     Comments: Scattered pale purple bruising bilateral upper extremities.  Neurological:     Mental Status: She is alert and oriented to person, place, and time.  Psychiatric:         Attention and Perception: Attention normal.        Mood and Affect: Mood normal.        Speech: Speech normal.        Behavior: Behavior normal. Behavior is cooperative.        Thought Content: Thought content normal.     Results for orders placed or performed in visit on 10/19/21  HM DIABETES EYE EXAM  Result Value Ref Range   HM Diabetic Eye Exam No Retinopathy No Retinopathy      Assessment & Plan:   Problem List Items Addressed This Visit       Cardiovascular and Mediastinum   Type 2 diabetes, controlled, with peripheral circulatory disorder ([place])    Chronic, ongoing with A1c 7.5% in June, previous was 6.3%.  Urine ALB 30 and A:C <30 in March 2023.  Recommend she take Olmesartan daily for kidney protection.  Continue Farxiga 10 MG daily and stop Metformin -- start Januvia 100 MG daily (did not tolerate GLP1 in past), educated her on this and the benefits + action.  Check BS BID.  Monitor diet at home and reduce carbs and foods high in sugar.  Continue to collaborate with vascular.  Return in 2 months.      Relevant Medications   sitaGLIPtin (JANUVIA) 100 MG tablet     Follow up plan: Return in about 2 months (around 12/21/2021) for T2DM, HTN/HLD, MOOD, GERD, COPD.

## 2021-10-27 ENCOUNTER — Other Ambulatory Visit
Admission: RE | Admit: 2021-10-27 | Discharge: 2021-10-27 | Disposition: A | Discharge status: Home / Self Care | Payer: Medicare Other | Source: Ambulatory Visit | Attending: Medical | Admitting: Medical

## 2021-10-27 ENCOUNTER — Encounter: Payer: Self-pay | Admitting: Medical

## 2021-10-27 ENCOUNTER — Ambulatory Visit: Payer: Medicare Other | Admitting: Medical

## 2021-10-27 VITALS — BP 110/66 | HR 68 | Ht 67.0 in | Wt 208.0 lb

## 2021-10-27 DIAGNOSIS — R0602 Shortness of breath: Secondary | ICD-10-CM | POA: Diagnosis not present

## 2021-10-27 DIAGNOSIS — Z720 Tobacco use: Secondary | ICD-10-CM

## 2021-10-27 DIAGNOSIS — R0789 Other chest pain: Secondary | ICD-10-CM

## 2021-10-27 DIAGNOSIS — R600 Localized edema: Secondary | ICD-10-CM

## 2021-10-27 DIAGNOSIS — I5032 Chronic diastolic (congestive) heart failure: Secondary | ICD-10-CM | POA: Diagnosis not present

## 2021-10-27 DIAGNOSIS — I10 Essential (primary) hypertension: Secondary | ICD-10-CM | POA: Diagnosis not present

## 2021-10-27 DIAGNOSIS — J449 Chronic obstructive pulmonary disease, unspecified: Secondary | ICD-10-CM

## 2021-10-27 DIAGNOSIS — I5189 Other ill-defined heart diseases: Secondary | ICD-10-CM | POA: Diagnosis not present

## 2021-10-27 LAB — BRAIN NATRIURETIC PEPTIDE: B Natriuretic Peptide: 49.2 pg/mL (ref 0.0–100.0)

## 2021-10-27 LAB — BASIC METABOLIC PANEL
Anion gap: 8 (ref 5–15)
BUN: 15 mg/dL (ref 8–23)
CO2: 26 mmol/L (ref 22–32)
Calcium: 9 mg/dL (ref 8.9–10.3)
Chloride: 104 mmol/L (ref 98–111)
Creatinine, Ser: 1.05 mg/dL — ABNORMAL HIGH (ref 0.44–1.00)
GFR, Estimated: 60 mL/min — ABNORMAL LOW (ref >60)
Glucose, Bld: 130 mg/dL — ABNORMAL HIGH (ref 70–99)
Potassium: 4.4 mmol/L (ref 3.5–5.1)
Sodium: 138 mmol/L (ref 135–145)

## 2021-10-27 LAB — CBC WITH DIFFERENTIAL/PLATELET
Abs Immature Granulocytes: 0.07 10*3/uL (ref 0.00–0.07)
Basophils Absolute: 0.1 10*3/uL (ref 0.0–0.1)
Basophils Relative: 1 %
Eosinophils Absolute: 0.1 10*3/uL (ref 0.0–0.5)
Eosinophils Relative: 1 %
HCT: 43.8 % (ref 36.0–46.0)
Hemoglobin: 14.3 g/dL (ref 12.0–15.0)
Immature Granulocytes: 1 %
Lymphocytes Relative: 23 %
Lymphs Abs: 2.6 10*3/uL (ref 0.7–4.0)
MCH: 29.7 pg (ref 26.0–34.0)
MCHC: 32.6 g/dL (ref 30.0–36.0)
MCV: 91.1 fL (ref 80.0–100.0)
Monocytes Absolute: 0.5 10*3/uL (ref 0.1–1.0)
Monocytes Relative: 4 %
Neutro Abs: 8.3 10*3/uL — ABNORMAL HIGH (ref 1.7–7.7)
Neutrophils Relative %: 70 %
Platelets: 308 10*3/uL (ref 150–400)
RBC: 4.81 MIL/uL (ref 3.87–5.11)
RDW: 12.2 % (ref 11.5–15.5)
WBC: 11.8 10*3/uL — ABNORMAL HIGH (ref 4.0–10.5)
nRBC: 0 % (ref 0.0–0.2)

## 2021-10-27 MED ORDER — FUROSEMIDE 20 MG PO TABS: 20.0000 mg | ORAL_TABLET | Freq: Every day | ORAL | 0 refills | Status: DC | PRN

## 2021-10-27 NOTE — Patient Instructions (Signed)
Medication Instructions:  Your physician has recommended you make the following change in your medication:   START Lasix 20 mg daily as needed for swelling or weight gain. An Rx has been sent to your pharmacy.  *If you need a refill on your cardiac medications before your next appointment, please call your pharmacy*   Lab Work: Bmp, Bnp, Cbc today.  Please have your labs drawn at the [facility]. Stop at the Registration desk to check in.  If you have labs (blood work) drawn today and your tests are completely normal, you will receive your results only by: [place] (if you have MyChart) OR A paper copy in the mail If you have any lab test that is abnormal or we need to change your treatment, we will call you to review the results.   Testing/Procedures: None ordered   Follow-Up: At [facility], you and your health needs are our priority.  As part of our continuing mission to provide you with exceptional heart care, we have created designated Provider Care Teams.  These Care Teams include your primary Cardiologist (physician) and Advanced Practice Providers (APPs -  Physician Assistants and Nurse Practitioners) who all work together to provide you with the care you need, when you need it.  We recommend signing up for the patient portal called "MyChart".  Sign up information is provided on this After Visit Summary.  MyChart is used to connect with patients for Virtual Visits (Telemedicine).  Patients are able to view lab/test results, encounter notes, upcoming appointments, etc.  Non-urgent messages can be sent to your provider as well.   To learn more about what you can do with MyChart, go to [email or web address].    Your next appointment:   4 week(s)   You have been referred to [facility]. Their office will contact you to schedule.  The format for your next appointment:   In Person  Provider:   You may see Kate Sable, MD or one of the following  Advanced Practice Providers on your designated Care Team:   Murray Hodgkins, NP Christell Faith, PA-C Cadence Kathlen Mody, [place]   Other Instructions N/A  Important Information About Sugar

## 2021-10-27 NOTE — Progress Notes (Signed)
Cardiology Office Note:    Date:  10/27/2021   ID:  Stacey Gross, DOB 07-22-1960, MRN [id number]  PCP:  Venita Lick, NP  [facility] Cardiologist:  Kate Sable, MD  [facility] Electrophysiologist:  None   Referring MD: Venita Lick, NP   Chief Complaint: 1 year follow-up  History of Present Illness:    Stacey Gross is a 61 y.o. female with a hx of diabetes, HLD, COPD on 2L O2, tobacco use who presents for 1 year follow-up.  Cardiac CTA 01/2020 showed calcium score of 0, no CAD. Echo 09/2020 showed normal systolic function, EF 63%, impaired relaxation. Heart monitor for palpitations showed no significant arrhythmias, occasional PACs/PVCs.  Last seen 10/2020 for palpitations and was started on Verapamil.  Today, the patient reports she has been having progressive SOB over the last 6 months. PCP ordered an echocardiogram. This showed LVEF 50-55%, G2DD; this was reviewed during the visit. She is reporting left sided swelling which is worse at the end of the say. She denies any injuries on the left side. Swelling improves overnight.  She cannot lay flat at night, she is using 3 pillows. Also has pnd. She reports palpitations, feels like heart is racing all the time. Has occasional chest pain. No recent fever or chills. She was previously on lasix 10 or 20 mg. She is supposed to be on O2, but is unable to carry the tanks. She needs pulmonary referral.  Past Medical History:  Diagnosis Date   Asthma    Cancer ([place])    stomach   Chronic back pain    COPD (chronic obstructive pulmonary disease) (HCC)    Degenerative joint disease (DJD) of hip    Diabetes mellitus without complication (HCC)    Dyspnea    GERD (gastroesophageal reflux disease)    takes Omeprazole daily   History of bronchitis yrs ago   History of colon polyps    benign   History of staph infection 10 yrs ago   Hyperlipidemia    takes Atorvastatin daily   Hypertension    Palpitations     Peripheral vascular disease (HCC)    Tobacco use    8 cigarettes daily    Weakness    numbness and tingling in both legs    Wears dentures    full upper    Past Surgical History:  Procedure Laterality Date   APPENDECTOMY     BACK SURGERY  9-10 yrs ago   BREAST BIOPSY Right    Core- neg   COLONOSCOPY     COLONOSCOPY WITH PROPOFOL N/A 09/20/2017   Procedure: COLONOSCOPY WITH PROPOFOL;  Surgeon: Virgel Manifold, MD;  Location: [facility] ENDOSCOPY;  Service: Endoscopy;  Laterality: N/A;   COLONOSCOPY WITH PROPOFOL N/A 04/24/2018   Procedure: COLONOSCOPY WITH PROPOFOL;  Surgeon: Lucilla Lame, MD;  Location: [facility] ENDOSCOPY;  Service: Endoscopy;  Laterality: N/A;   COLONOSCOPY WITH PROPOFOL N/A 06/10/2021   Procedure: COLONOSCOPY WITH BIOPSY;  Surgeon: Lucilla Lame, MD;  Location: [place];  Service: Endoscopy;  Laterality: N/A;   ESOPHAGEAL DILATION  06/10/2021   Procedure: ESOPHAGEAL DILATION;  Surgeon: Lucilla Lame, MD;  Location: [place];  Service: Endoscopy;;  15-18 mm   ESOPHAGOGASTRODUODENOSCOPY (EGD) WITH PROPOFOL N/A 09/20/2017   Procedure: ESOPHAGOGASTRODUODENOSCOPY (EGD) WITH PROPOFOL;  Surgeon: Virgel Manifold, MD;  Location: [facility] ENDOSCOPY;  Service: Endoscopy;  Laterality: N/A;   ESOPHAGOGASTRODUODENOSCOPY (EGD) WITH PROPOFOL N/A 11/20/2018   Procedure: ESOPHAGOGASTRODUODENOSCOPY (EGD) WITH PROPOFOL;  Surgeon: Lucilla Lame, MD;  Location: [facility] ENDOSCOPY;  Service: Endoscopy;  Laterality: N/A;   ESOPHAGOGASTRODUODENOSCOPY (EGD) WITH PROPOFOL N/A 06/10/2021   Procedure: ESOPHAGOGASTRODUODENOSCOPY (EGD) WITH BIOPSY;  Surgeon: Lucilla Lame, MD;  Location: [place];  Service: Endoscopy;  Laterality: N/A;  Diabetic Latex   PERIPHERAL VASCULAR CATHETERIZATION Left 03/17/2016   Procedure: Lower Extremity Angiography;  Surgeon: Algernon Huxley, MD;  Location: [place] CV LAB;  Service: Cardiovascular;  Laterality: Left;   POLYPECTOMY N/A 06/10/2021    Procedure: POLYPECTOMY;  Surgeon: Lucilla Lame, MD;  Location: [place];  Service: Endoscopy;  Laterality: N/A;   TOTAL ABDOMINAL HYSTERECTOMY W/ BILATERAL SALPINGOOPHORECTOMY     total   TOTAL SHOULDER ARTHROPLASTY Right 07/24/2019   Procedure: RIGHT TOTAL SHOULDER ARTHROPLASTY;  Surgeon: Lovell Sheehan, MD;  Location: [facility];  Service: Orthopedics;  Laterality: Right;    Current Medications: Current Meds  Medication Sig   Accu-Chek Softclix Lancets lancets USE TO CHECK BLOOD SUGAR DAILY   albuterol (PROVENTIL) (2.5 MG/3ML) 0.083% nebulizer solution USE 1 VIAL (3 ML = 2.5 MG TOTAL) BY NEBULIZATION EVERY 6 HOURS AS NEEDED FORWHEEZING OR SHORTNESS OF BREATH   albuterol (VENTOLIN HFA) 108 (90 Base) MCG/ACT inhaler INHALE 2 PUFFS BY MOUTH INTO THE LUNGS EVERY 6 HOURS AS NEEDED FOR WHEEZING OR SHORTNESS OF BREATH   Blood Glucose Monitoring Suppl (ACCU-CHEK GUIDE ME) w/Device KIT Use to check blood sugars 2-3 times daily with goals = <130 fasting in morning and <180 two hours after eating.  Bring blood sugar log to appointments.   brimonidine (ALPHAGAN) 0.2 % ophthalmic solution Place 1 drop into the right eye as directed.   Budeson-Glycopyrrol-Formoterol (BREZTRI AEROSPHERE) 160-9-4.8 MCG/ACT AERO Inhale 2 puffs into the lungs in the morning and at bedtime.   Cholecalciferol 1.25 MG (50000 UT) TABS Take 1 tablet by mouth once a week.   citalopram (CELEXA) 20 MG tablet Take 1 tablet (20 mg total) by mouth daily.   dapagliflozin propanediol (FARXIGA) 10 MG TABS tablet Take 1 tablet (10 mg total) by mouth daily before breakfast.   furosemide (LASIX) 20 MG tablet Take 1 tablet (20 mg total) by mouth daily as needed.   glucose blood test strip Use to check blood sugar three times a day.   hydrOXYzine (VISTARIL) 25 MG capsule Take 1 capsule (25 mg total) by mouth every 8 (eight) hours as needed.   lactobacillus acidophilus & bulgar (LACTINEX) chewable tablet Chew 1 tablet by mouth 3 (three)  times daily as needed (take after every stools).   nystatin (MYCOSTATIN) 100000 UNIT/ML suspension Take 5 mLs (500,000 Units total) by mouth 4 (four) times daily.   olmesartan (BENICAR) 5 MG tablet Take 1 tablet (5 mg total) by mouth daily.   pantoprazole (PROTONIX) 40 MG tablet Take 1 tablet (40 mg total) by mouth daily.   prochlorperazine (COMPAZINE) 10 MG tablet Take 10 mg by mouth every 6 (six) hours as needed.   rosuvastatin (CRESTOR) 40 MG tablet Take 1 tablet (40 mg total) by mouth daily.   sitaGLIPtin (JANUVIA) 100 MG tablet Take 1 tablet (100 mg total) by mouth daily.   traZODone (DESYREL) 100 MG tablet TAKE 1 TABLET BY MOUTH AT BEDTIME AS NEEDED FOR SLEEP   verapamil (CALAN-SR) 120 MG CR tablet Take 1 tablet (120 mg total) by mouth at bedtime.   vitamin B-12 (CYANOCOBALAMIN) 1000 MCG tablet Take 1 tablet (1,000 mcg total) by mouth daily.     Allergies:   Doxycycline hyclate, Acetaminophen,  Aspirin, Latex, and Vancomycin   Social History   Socioeconomic History   Marital status: Widowed    Spouse name: Not on file   Number of children: 0   Years of education: 8   Highest education level: Not on file  Occupational History   Occupation: disabled  Tobacco Use   Smoking status: Every Day    Packs/day: 1.50    Years: 48.00    Total pack years: 72.00    Types: Cigarettes   Smokeless tobacco: Never   Tobacco comments:    Started smoking around age 68  Vaping Use   Vaping Use: Never used  Substance and Sexual Activity   Alcohol use: No   Drug use: No   Sexual activity: Not on file  Other Topics Concern   Not on file  Social History Narrative   Not on file   Social Determinants of Health   Financial Resource Strain: Low Risk  (05/17/2021)   Overall Financial Resource Strain (CARDIA)    Difficulty of Paying Living Expenses: Not hard at all  Food Insecurity: No Food Insecurity (05/17/2021)   Hunger Vital Sign    Worried About Running Out of Food in the Last Year: Never  true    [place] in the Last Year: Never true  Transportation Needs: No Transportation Needs (05/17/2021)   PRAPARE - Hydrologist (Medical): No    Lack of Transportation (Non-Medical): No  Physical Activity: Inactive (05/17/2021)   Exercise Vital Sign    Days of Exercise per Week: 0 days    Minutes of Exercise per Session: 0 min  Stress: No Stress Concern Present (05/17/2021)   [place]    Feeling of Stress : Not at all  Social Connections: Socially Isolated (05/17/2021)   Social Connection and Isolation Panel [NHANES]    Frequency of Communication with Friends and Family: Once a week    Frequency of Social Gatherings with Friends and Family: More than three times a week    Attends Religious Services: Never    Marine scientist or Organizations: No    Attends Music therapist: Never    Marital Status: Divorced     Family History: The patient's family history includes Hyperlipidemia in her father and mother; Melanoma in her sister. There is no history of Breast cancer.  ROS:   Please see the history of present illness.     All other systems reviewed and are negative.  EKGs/Labs/Other Studies Reviewed:    The following studies were reviewed today:  Echo 10/2021 1. Left ventricular ejection fraction, by estimation, is 50 to 55%. The  left ventricle has low normal function. The left ventricle has no regional  wall motion abnormalities. Left ventricular diastolic parameters are  consistent with Grade II diastolic  dysfunction (pseudonormalization).   2. Right ventricular systolic function is normal. The right ventricular  size is normal. Moderately increased right ventricular wall thickness.   3. The mitral valve is normal in structure. No evidence of mitral valve  regurgitation.   4. The aortic valve was not well visualized. Aortic valve regurgitation  is not  visualized.   5. The inferior vena cava is normal in size with greater than 50%  respiratory variability, suggesting right atrial pressure of 3 mmHg.   EKG:  EKG is  ordered today.  The ekg ordered today demonstrates NSR 68bpm, nonspecific T wave changes, low voltage  Recent Labs: 09/29/2021: ALT 26; BUN 14; Creatinine, Ser 0.89; Hemoglobin 15.2; Magnesium 1.8; Platelets 315; Potassium 4.9; Sodium 134; TSH 1.180  Recent Lipid Panel    Component Value Date/Time   CHOL 185 09/29/2021 0936   CHOL 194 02/22/2017 0847   TRIG 198 (H) 09/29/2021 0936   TRIG 227 (H) 02/22/2017 0847   HDL 39 (L) 09/29/2021 0936   CHOLHDL 4.1 05/13/2020 0934   VLDL 45 (H) 02/22/2017 0847   LDLCALC 111 (H) 09/29/2021 0936    Physical Exam:    VS:  BP 110/66 (BP Location: Left Arm, Patient Position: Sitting, Cuff Size: Large)   Pulse 68   Ht 5' 7"  (1.702 m)   Wt 208 lb (94.3 kg)   SpO2 94%   BMI 32.58 kg/m     Wt Readings from Last 3 Encounters:  10/27/21 208 lb (94.3 kg)  10/20/21 210 lb 6.4 oz (95.4 kg)  09/29/21 210 lb 12.8 oz (95.6 kg)     GEN:  Well nourished, well developed in no acute distress HEENT: Normal NECK: No JVD; No carotid bruits LYMPHATICS: No lymphadenopathy CARDIAC: RRR, no murmurs, rubs, gallops RESPIRATORY:  diffusely diminished breath sounds  ABDOMEN: Soft, non-tender, non-distended MUSCULOSKELETAL:  No edema; No deformity  SKIN: Warm and dry NEUROLOGIC:  Alert and oriented x 3 PSYCHIATRIC:  Normal affect   ASSESSMENT:    1. Leg edema   2. Chronic obstructive pulmonary disease, unspecified COPD type ([place])   3. SOB (shortness of breath)   4. Chronic diastolic heart failure ([place])   5. Diastolic dysfunction   6. Tobacco use   7. Atypical chest pain   8. Essential hypertension    PLAN:    In order of problems listed above:  Leg edema/SOB HFpEF Chronic diastolic dysfunction No overt edema on exam today. She reports left sided lower extremity edema that is worse  at the end of the day, DOE, orthopnea and pnd. She was on lasix a long time ago, now on Farxiga 73m daily. I will giver her lasix 247mto use as needed for lower leg swelling. I recommended daily weights, low salt, leg elevation and compression socks. I will check BMET, BNP and CBC.   Tobacco use She is still smoking with no plans to quit.   COPD  She reports she should be on 2L O2 but is unable to carry the O2 tanks due to shoulder injuries. She needs the portable O2. I will send her to pulmonology for further management. O2 today is 94%. This is likely contributing to breathing issues.  HTN BP is good today. Continue Benicar 77m1maily and Verapamil 120m17mily.   Atypical chest pain She has occasional chest pain. Cardiac CTA in 2021 showed no CAD, which is overall reassuring.  Disposition: Follow up in 1 month(s) with APP/MD    Signed, Ayan Heffington H FuNinfa Meeker-C  10/27/2021 9:27 AM    [facility]

## 2021-11-04 ENCOUNTER — Ambulatory Visit: Payer: Medicare Other | Admitting: Pulmonary Disease

## 2021-11-04 ENCOUNTER — Encounter: Payer: Self-pay | Admitting: Pulmonary Disease

## 2021-11-04 VITALS — BP 128/80 | HR 67 | Temp 97.7°F | Ht 67.0 in | Wt 209.8 lb

## 2021-11-04 DIAGNOSIS — I5032 Chronic diastolic (congestive) heart failure: Secondary | ICD-10-CM

## 2021-11-04 DIAGNOSIS — F17210 Nicotine dependence, cigarettes, uncomplicated: Secondary | ICD-10-CM

## 2021-11-04 DIAGNOSIS — J449 Chronic obstructive pulmonary disease, unspecified: Secondary | ICD-10-CM

## 2021-11-04 DIAGNOSIS — R0602 Shortness of breath: Secondary | ICD-10-CM

## 2021-11-04 NOTE — Progress Notes (Signed)
Subjective:    Patient ID: Stacey Gross, female    DOB: March 27, 1961, 61 y.o.   MRN: [id number] Patient Care Team: Venita Lick, NP as PCP - General (Nurse Practitioner) Kate Sable, MD as PCP - Cardiology (Cardiology) Guadalupe Maple, MD (Family Medicine)  Chief Complaint  Patient presents with   Follow-up    SOB with exertion, prod cough with clear sputum and wheezing.     HPI The patient is a 61 year old current smoker (2 PPD, 76 PY) presents for second opinion on the issue of shortness of breath.  She experienced increasing shortness of breath since 2022.  She states that initially she would get winded walking even 5 feet.  Her issues started in around April 2022 when she was noted to have pneumonia and required a hospitalization at [facility].  He was then evaluated by Dr. Christinia Gully on 21 September 2020.  Last visit with Dr. Melvyn Novas was on 27 October 2020.  She was discharged from [facility] on oxygen at 2 L/min but was weaned off.  She uses albuterol at least once a day.  She was started on Breztri 2 puffs twice a day at her last visit with Dr. Lottie Dawson.  She notes that Judithann Sauger has been effective.  She notes that when she gets short of breath resting is the most helpful maneuver.  She has had some chest tightness on occasion as well as tachypalpitations.  She follows with cardiology for these issues.  She also has grade 2 diastolic this function with chronic diastolic heart failure.  She has had no fevers, chills or sweats.  Cough has been productive of whitish to pale yellow sputum.  No lower extremity edema, no calf tenderness.  She is disabled with no significant occupational exposure.  She was not "sickly" as a child.  No history of asthma as a child or respiratory issues.  No seasonal change to [place].   Review of Systems A 10 point review of systems was performed and it is as noted above otherwise negative.  Past Medical History:  Diagnosis Date   Asthma    Cancer ([place])    stomach    Chronic back pain    COPD (chronic obstructive pulmonary disease) (HCC)    Degenerative joint disease (DJD) of hip    Diabetes mellitus without complication (HCC)    Dyspnea    GERD (gastroesophageal reflux disease)    takes Omeprazole daily   History of bronchitis yrs ago   History of colon polyps    benign   History of staph infection 10 yrs ago   Hyperlipidemia    takes Atorvastatin daily   Hypertension    Palpitations    Peripheral vascular disease (HCC)    Tobacco use    8 cigarettes daily    Weakness    numbness and tingling in both legs    Wears dentures    full upper   Past Surgical History:  Procedure Laterality Date   APPENDECTOMY     BACK SURGERY  9-10 yrs ago   BREAST BIOPSY Right    Core- neg   COLONOSCOPY     COLONOSCOPY WITH PROPOFOL N/A 09/20/2017   Procedure: COLONOSCOPY WITH PROPOFOL;  Surgeon: Virgel Manifold, MD;  Location: [facility] ENDOSCOPY;  Service: Endoscopy;  Laterality: N/A;   COLONOSCOPY WITH PROPOFOL N/A 04/24/2018   Procedure: COLONOSCOPY WITH PROPOFOL;  Surgeon: Lucilla Lame, MD;  Location: [facility] ENDOSCOPY;  Service: Endoscopy;  Laterality: N/A;   COLONOSCOPY WITH PROPOFOL  N/A 06/10/2021   Procedure: COLONOSCOPY WITH BIOPSY;  Surgeon: Lucilla Lame, MD;  Location: [place];  Service: Endoscopy;  Laterality: N/A;   ESOPHAGEAL DILATION  06/10/2021   Procedure: ESOPHAGEAL DILATION;  Surgeon: Lucilla Lame, MD;  Location: [place];  Service: Endoscopy;;  15-18 mm   ESOPHAGOGASTRODUODENOSCOPY (EGD) WITH PROPOFOL N/A 09/20/2017   Procedure: ESOPHAGOGASTRODUODENOSCOPY (EGD) WITH PROPOFOL;  Surgeon: Virgel Manifold, MD;  Location: [facility] ENDOSCOPY;  Service: Endoscopy;  Laterality: N/A;   ESOPHAGOGASTRODUODENOSCOPY (EGD) WITH PROPOFOL N/A 11/20/2018   Procedure: ESOPHAGOGASTRODUODENOSCOPY (EGD) WITH PROPOFOL;  Surgeon: Lucilla Lame, MD;  Location: [facility] ENDOSCOPY;  Service: Endoscopy;  Laterality: N/A;   ESOPHAGOGASTRODUODENOSCOPY  (EGD) WITH PROPOFOL N/A 06/10/2021   Procedure: ESOPHAGOGASTRODUODENOSCOPY (EGD) WITH BIOPSY;  Surgeon: Lucilla Lame, MD;  Location: [place];  Service: Endoscopy;  Laterality: N/A;  Diabetic Latex   PERIPHERAL VASCULAR CATHETERIZATION Left 03/17/2016   Procedure: Lower Extremity Angiography;  Surgeon: Algernon Huxley, MD;  Location: [place] [facility];  Service: Cardiovascular;  Laterality: Left;   POLYPECTOMY N/A 06/10/2021   Procedure: POLYPECTOMY;  Surgeon: Lucilla Lame, MD;  Location: [place];  Service: Endoscopy;  Laterality: N/A;   TOTAL ABDOMINAL HYSTERECTOMY W/ BILATERAL SALPINGOOPHORECTOMY     total   TOTAL SHOULDER ARTHROPLASTY Right 07/24/2019   Procedure: RIGHT TOTAL SHOULDER ARTHROPLASTY;  Surgeon: Lovell Sheehan, MD;  Location: [facility];  Service: Orthopedics;  Laterality: Right;   Patient Active Problem List   Diagnosis Date Noted   Esophageal dysphagia    Vitamin D deficiency 04/01/2021   Orthopnea 08/26/2020   Chronic respiratory failure with hypoxia and hypercapnia (HCC) 08/14/2020   PVD (peripheral vascular disease) ([place]) 07/25/2020   B12 deficiency 01/07/2020   Obesity 10/04/2019   Status post total shoulder arthroplasty, right 07/24/2019   Senile purpura ([place]) 06/17/2019   Hepatic steatosis 06/03/2019   Nummular dermatitis 05/28/2019   Centrilobular emphysema ([place]) 05/14/2019   Generalized anxiety disorder 12/17/2018   Type 2 diabetes, controlled, with peripheral circulatory disorder ([place]) 03/21/2018   Hx of colonic polyps    Diverticulosis of large intestine without diverticulitis    Ectopic gastric tissue    Aortic atherosclerosis ([place]) 04/18/2017   Advanced care planning/counseling discussion 02/22/2017   Blue toe syndrome ([place]) 03/04/2016   GERD (gastroesophageal reflux disease) 12/30/2014   Hyperlipidemia associated with type 2 diabetes mellitus ([place])    Hypertension associated with diabetes ([place])    Degenerative joint disease (DJD) of  hip    S/P lumbar fusion 07/02/2013   Nicotine dependence, cigarettes, w unsp disorders 02/23/2010   Family History  Problem Relation Age of Onset   Hyperlipidemia Mother    Hyperlipidemia Father    Melanoma Sister    Breast cancer Neg Hx    Social History   Tobacco Use   Smoking status: Every Day    Packs/day: 2.00    Years: 48.00    Total pack years: 96.00    Types: Cigarettes   Smokeless tobacco: Never   Tobacco comments:    2PPD 11/04/2021  Substance Use Topics   Alcohol use: No   Allergies  Allergen Reactions   Doxycycline Hyclate Anaphylaxis, Swelling and Rash   Acetaminophen    Aspirin    Latex Rash    Gloves, underwear elastic, tape   Vancomycin Rash    All mycins   Current Meds  Medication Sig   Accu-Chek Softclix Lancets lancets USE TO CHECK BLOOD SUGAR DAILY   albuterol (PROVENTIL) (2.5 MG/3ML)  0.083% nebulizer solution USE 1 VIAL (3 ML = 2.5 MG TOTAL) BY NEBULIZATION EVERY 6 HOURS AS NEEDED FORWHEEZING OR SHORTNESS OF BREATH   albuterol (VENTOLIN HFA) 108 (90 Base) MCG/ACT inhaler INHALE 2 PUFFS BY MOUTH INTO THE LUNGS EVERY 6 HOURS AS NEEDED FOR WHEEZING OR SHORTNESS OF BREATH   Blood Glucose Monitoring Suppl (ACCU-CHEK GUIDE ME) w/Device KIT Use to check blood sugars 2-3 times daily with goals = <130 fasting in morning and <180 two hours after eating.  Bring blood sugar log to appointments.   brimonidine (ALPHAGAN) 0.2 % ophthalmic solution Place 1 drop into the right eye as directed.   Budeson-Glycopyrrol-Formoterol (BREZTRI AEROSPHERE) 160-9-4.8 MCG/ACT AERO Inhale 2 puffs into the lungs in the morning and at bedtime.   Cholecalciferol 1.25 MG (50000 UT) TABS Take 1 tablet by mouth once a week.   citalopram (CELEXA) 20 MG tablet Take 1 tablet (20 mg total) by mouth daily.   dapagliflozin propanediol (FARXIGA) 10 MG TABS tablet Take 1 tablet (10 mg total) by mouth daily before breakfast.   furosemide (LASIX) 20 MG tablet Take 1 tablet (20 mg total) by mouth  daily as needed.   glucose blood test strip Use to check blood sugar three times a day.   hydrOXYzine (VISTARIL) 25 MG capsule Take 1 capsule (25 mg total) by mouth every 8 (eight) hours as needed.   lactobacillus acidophilus & bulgar (LACTINEX) chewable tablet Chew 1 tablet by mouth 3 (three) times daily as needed (take after every stools).   nystatin (MYCOSTATIN) 100000 UNIT/ML suspension Take 5 mLs (500,000 Units total) by mouth 4 (four) times daily.   olmesartan (BENICAR) 5 MG tablet Take 1 tablet (5 mg total) by mouth daily.   pantoprazole (PROTONIX) 40 MG tablet Take 1 tablet (40 mg total) by mouth daily.   prochlorperazine (COMPAZINE) 10 MG tablet Take 10 mg by mouth every 6 (six) hours as needed.   rosuvastatin (CRESTOR) 40 MG tablet Take 1 tablet (40 mg total) by mouth daily.   sitaGLIPtin (JANUVIA) 100 MG tablet Take 1 tablet (100 mg total) by mouth daily.   traZODone (DESYREL) 100 MG tablet TAKE 1 TABLET BY MOUTH AT BEDTIME AS NEEDED FOR SLEEP   verapamil (CALAN-SR) 120 MG CR tablet Take 1 tablet (120 mg total) by mouth at bedtime.   vitamin B-12 (CYANOCOBALAMIN) 1000 MCG tablet Take 1 tablet (1,000 mcg total) by mouth daily.   Immunization History  Administered Date(s) Administered   Influenza,inj,Quad PF,6+ Mos 02/09/2016, 02/22/2017, 05/09/2018, 12/17/2018, 01/07/2020   Pneumococcal Polysaccharide-23 02/09/2016   Td 04/04/2004, 05/13/2014   Zoster Recombinat (Shingrix) 01/07/2019, 04/22/2019      Objective:   Physical Exam BP 128/80 (BP Location: Right Arm, Cuff Size: Normal)   Pulse 67   Temp 97.7 F (36.5 C) (Temporal)   Ht $R'5\' 7"'lw$  (1.702 m)   Wt 209 lb 12.8 oz (95.2 kg)   SpO2 94%   BMI 32.86 kg/m   SpO2: 94 % O2 Device: None (Room air)  GENERAL: Well-developed, obese, fully ambulatory, no conversational dyspnea. HEAD: Normocephalic, atraumatic.  EYES: Pupils equal, round, reactive to light.  No scleral icterus.  MOUTH: Nose/mouth/throat not examined due to  institutional masking requirements COVID-19. NECK: Supple. No thyromegaly. Trachea midline. No JVD.  No adenopathy. PULMONARY: Distant breath sounds bilaterally.  Coarse otherwise, no adventitious sounds. CARDIOVASCULAR: S1 and S2. Regular rate and rhythm.  No rubs, murmurs or gallops heard. ABDOMEN: Obese otherwise benign MUSCULOSKELETAL: No joint deformity, no clubbing, no edema.  NEUROLOGIC: No overt focal deficit, no gait disturbance, speech is fluent. SKIN: Intact,warm,dry. PSYCH: Mood and behavior normal.  Ambulatory oximetry was performed today: At rest heart rate was 67 bpm, O2 sats on room air 95%, O2 nadir was 93% after patient had ambulated 550 feet.  Maximum heart rate 83 bpm.  Study was stopped due to shortness of breath.     Assessment & Plan:     ICD-10-CM   1. COPD mixed type ([place])  J44.9 Pulse oximetry, overnight   Continue Breztri 2 puffs twice a day Continue as needed albuterol STOP SMOKING!    2. Chronic diastolic heart failure (HCC)  I50.32    This issue adds complexity to her management Continue follow-up with cardiology    3. Shortness of breath  R06.02    Multifactorial: COPD, diastolic dysfunction Obesity, deconditioning Query nocturnal hypoxemia    4. Tobacco dependence due to cigarettes  F17.210    It is imperative patient quit smoking Patient counseled regards discontinuation of smoking Overall counseling time 3 to 5 minutes     Orders Placed This Encounter  Procedures   Pulse oximetry, overnight    On roomair  DME:new start. Not adapt per patient request    Standing Status:   Future    Standing Expiration Date:   11/05/2022   It was stressed to the patient that she needs to engage in smoking cessation.  She is to continue Breztri 2 puffs twice a day for now as well as as needed albuterol.  She did not exhibit oxygen desaturations during ambulation today.  We have placed an order for overnight oximetry to further evaluate her issues with  dyspnea.  We will see her in follow-up in 2 to 3 months time she is to contact us prior to that time should any new difficulties arise.  Renold Don, MD Advanced Bronchoscopy PCCM [facility]    *This note was dictated using voice recognition software/Dragon.  Despite best efforts to proofread, errors can occur which can change the meaning. Any transcriptional errors that result from this process are unintentional and may not be fully corrected at the time of dictation.

## 2021-11-04 NOTE — Patient Instructions (Signed)
It is very important that you engage in smoking cessation.  This is the best next step to help your lungs and your heart.  Continue using your Breztri 2 puffs twice a day.  You did well with your walking test today.  Your oxygen level maintained at 93% or better.  We are going to check your nighttime oxygen level to see if you need supplemental oxygen.  We will see you in follow-up in 2 to 3 months time call sooner should any new problems arise.  We will order a breathing test that will be done closer to the time you come back.

## 2021-11-15 ENCOUNTER — Encounter: Payer: Self-pay | Admitting: Pulmonary Disease

## 2021-11-17 DIAGNOSIS — J449 Chronic obstructive pulmonary disease, unspecified: Secondary | ICD-10-CM | POA: Diagnosis not present

## 2021-11-24 ENCOUNTER — Ambulatory Visit: Payer: Medicare Other | Admitting: Medical

## 2021-11-24 ENCOUNTER — Telehealth: Payer: Self-pay | Admitting: Medical

## 2021-11-24 ENCOUNTER — Encounter: Payer: Self-pay | Admitting: Medical

## 2021-11-24 VITALS — BP 110/68 | HR 74 | Ht 67.0 in | Wt 212.6 lb

## 2021-11-24 DIAGNOSIS — J449 Chronic obstructive pulmonary disease, unspecified: Secondary | ICD-10-CM | POA: Diagnosis not present

## 2021-11-24 DIAGNOSIS — I5189 Other ill-defined heart diseases: Secondary | ICD-10-CM | POA: Diagnosis not present

## 2021-11-24 DIAGNOSIS — I10 Essential (primary) hypertension: Secondary | ICD-10-CM | POA: Diagnosis not present

## 2021-11-24 DIAGNOSIS — I5032 Chronic diastolic (congestive) heart failure: Secondary | ICD-10-CM | POA: Diagnosis not present

## 2021-11-24 DIAGNOSIS — R0789 Other chest pain: Secondary | ICD-10-CM

## 2021-11-24 DIAGNOSIS — Z720 Tobacco use: Secondary | ICD-10-CM

## 2021-11-24 NOTE — Progress Notes (Signed)
Cardiology Office Note:    Date:  11/24/2021   ID:  Stacey Gross, DOB 04/20/1960, MRN [id number]  PCP:  Venita Lick, NP  [facility] Cardiologist:  Kate Sable, MD  [facility] Electrophysiologist:  None   Referring MD: Venita Lick, NP   Chief Complaint: 4 week follow-up  History of Present Illness:    Stacey Gross is a 61 y.o. female with a hx of  diabetes, HLD, COPD, HFpEF, chronic diastolic dysfunction, tobacco use who presents for 1 year follow-up.   Cardiac CTA 01/2020 showed calcium score of 0, no CAD. Echo 09/2020 showed normal systolic function, EF 83%, impaired relaxation. Heart monitor for palpitations showed no significant arrhythmias, occasional PACs/PVCs.   Seen 10/2020 for palpitations and was started on Verapamil.  Last seen 10/27/21 and reported progressive SOB. Echo showed LVEF 50-55%, G2DD. She said she was supposed to be on O2, but was not. She was given lasix 54m to use as needed for LLE. She was referred to pulmonology.   Today, the patient still feels SOB. She say pulmonology and the walk test was over 93%.  Patient barely eats because she's not hungry. She takes the lasix as needed for lower leg edema. Patient is still smoking. She reports difficulty breathing overnight, although she doesn't want a sleep study. She has chronic left sided lower leg edema that is worse at night.   Past Medical History:  Diagnosis Date   Asthma    Cancer ([place]    stomach   Chronic back pain    COPD (chronic obstructive pulmonary disease) (HCC)    Degenerative joint disease (DJD) of hip    Diabetes mellitus without complication (HCC)    Dyspnea    GERD (gastroesophageal reflux disease)    takes Omeprazole daily   History of bronchitis yrs ago   History of colon polyps    benign   History of staph infection 10 yrs ago   Hyperlipidemia    takes Atorvastatin daily   Hypertension    Palpitations    Peripheral vascular disease (HCC)    Tobacco use     8 cigarettes daily    Weakness    numbness and tingling in both legs    Wears dentures    full upper    Past Surgical History:  Procedure Laterality Date   APPENDECTOMY     BACK SURGERY  9-10 yrs ago   BREAST BIOPSY Right    Core- neg   COLONOSCOPY     COLONOSCOPY WITH PROPOFOL N/A 09/20/2017   Procedure: COLONOSCOPY WITH PROPOFOL;  Surgeon: TVirgel Manifold MD;  Location: [facility];  Service: Endoscopy;  Laterality: N/A;   COLONOSCOPY WITH PROPOFOL N/A 04/24/2018   Procedure: COLONOSCOPY WITH PROPOFOL;  Surgeon: WLucilla Lame MD;  [facility];  Service: Endoscopy;  Laterality: N/A;   COLONOSCOPY WITH PROPOFOL N/A 06/10/2021   Procedure: COLONOSCOPY WITH BIOPSY;  Surgeon: WLucilla Lame MD;  [place]  Service: Endoscopy;  Laterality: N/A;   ESOPHAGEAL DILATION  06/10/2021   Procedure: ESOPHAGEAL DILATION;  Surgeon: WLucilla Lame MD;  [place]  Service: [place]oscopy;;  15-18 mm   ESOPHAGOGASTRODUODENOSCOPY (EGD) WITH PROPOFOL N/A 09/20/2017   Procedure: ESOPHAGOGASTRODUODENOSCOPY (EGD) WITH PROPOFOL;  Surgeon: TVirgel Manifold MD;  Location: [facility];  Service: Endoscopy;  Laterality: N/A;   ESOPHAGOGASTRODUODENOSCOPY (EGD) WITH PROPOFOL N/A 11/20/2018   Procedure: ESOPHAGOGASTRODUODENOSCOPY (EGD) WITH PROPOFOL;  Surgeon: WLucilla Lame MD;  Location: [facility];  Service: Endoscopy;  Laterality: N/A;   ESOPHAGOGASTRODUODENOSCOPY (EGD) WITH PROPOFOL N/A 06/10/2021   Procedure: ESOPHAGOGASTRODUODENOSCOPY (EGD) WITH BIOPSY;  Surgeon: Lucilla Lame, MD;  Location: [place];  Service: Endoscopy;  Laterality: N/A;  Diabetic Latex   PERIPHERAL VASCULAR CATHETERIZATION Left 03/17/2016   Procedure: Lower Extremity Angiography;  Surgeon: Algernon Huxley, MD;  Location: [place] [facility];  Service: Cardiovascular;  Laterality: Left;   POLYPECTOMY N/A 06/10/2021   Procedure: POLYPECTOMY;  Surgeon: Lucilla Lame, MD;   Location: [place];  Service: Endoscopy;  Laterality: N/A;   TOTAL ABDOMINAL HYSTERECTOMY W/ BILATERAL SALPINGOOPHORECTOMY     total   TOTAL SHOULDER ARTHROPLASTY Right 07/24/2019   Procedure: RIGHT TOTAL SHOULDER ARTHROPLASTY;  Surgeon: Lovell Sheehan, MD;  Location: [facility];  Service: Orthopedics;  Laterality: Right;    Current Medications: Current Meds  Medication Sig   Accu-Chek Softclix Lancets lancets USE TO CHECK BLOOD SUGAR DAILY   albuterol (PROVENTIL) (2.5 MG/3ML) 0.083% nebulizer solution USE 1 VIAL (3 ML = 2.5 MG TOTAL) BY NEBULIZATION EVERY 6 HOURS AS NEEDED FORWHEEZING OR SHORTNESS OF BREATH   albuterol (VENTOLIN HFA) 108 (90 Base) MCG/ACT inhaler INHALE 2 PUFFS BY MOUTH INTO THE LUNGS EVERY 6 HOURS AS NEEDED FOR WHEEZING OR SHORTNESS OF BREATH   Blood Glucose Monitoring Suppl (ACCU-CHEK GUIDE ME) w/Device KIT Use to check blood sugars 2-3 times daily with goals = <130 fasting in morning and <180 two hours after eating.  Bring blood sugar log to appointments.   brimonidine (ALPHAGAN) 0.2 % ophthalmic solution Place 1 drop into the right eye as directed.   Budeson-Glycopyrrol-Formoterol (BREZTRI AEROSPHERE) 160-9-4.8 MCG/ACT AERO Inhale 2 puffs into the lungs in the morning and at bedtime.   Cholecalciferol 1.25 MG (50000 UT) TABS Take 1 tablet by mouth once a week.   citalopram (CELEXA) 20 MG tablet Take 1 tablet (20 mg total) by mouth daily.   dapagliflozin propanediol (FARXIGA) 10 MG TABS tablet Take 1 tablet (10 mg total) by mouth daily before breakfast.   furosemide (LASIX) 20 MG tablet Take 1 tablet (20 mg total) by mouth daily as needed.   glucose blood test strip Use to check blood sugar three times a day.   hydrOXYzine (VISTARIL) 25 MG capsule Take 1 capsule (25 mg total) by mouth every 8 (eight) hours as needed.   lactobacillus acidophilus & bulgar (LACTINEX) chewable tablet Chew 1 tablet by mouth 3 (three) times daily as needed (take after every stools).    nystatin (MYCOSTATIN) 100000 UNIT/ML suspension Take 5 mLs (500,000 Units total) by mouth 4 (four) times daily.   olmesartan (BENICAR) 5 MG tablet Take 1 tablet (5 mg total) by mouth daily.   pantoprazole (PROTONIX) 40 MG tablet Take 1 tablet (40 mg total) by mouth daily.   prochlorperazine (COMPAZINE) 10 MG tablet Take 10 mg by mouth every 6 (six) hours as needed.   rosuvastatin (CRESTOR) 40 MG tablet Take 1 tablet (40 mg total) by mouth daily.   sitaGLIPtin (JANUVIA) 100 MG tablet Take 1 tablet (100 mg total) by mouth daily.   traZODone (DESYREL) 100 MG tablet TAKE 1 TABLET BY MOUTH AT BEDTIME AS NEEDED FOR SLEEP   verapamil (CALAN-SR) 120 MG CR tablet Take 1 tablet (120 mg total) by mouth at bedtime.   vitamin B-12 (CYANOCOBALAMIN) 1000 MCG tablet Take 1 tablet (1,000 mcg total) by mouth daily.     Allergies:   Doxycycline hyclate, Acetaminophen, Aspirin, Latex, and Vancomycin   Social History  Socioeconomic History   Marital status: Widowed    Spouse name: Not on file   Number of children: 0   Years of education: 8   Highest education level: Not on file  Occupational History   Occupation: disabled  Tobacco Use   Smoking status: Every Day    Packs/day: 2.00    Years: 48.00    Total pack years: 96.00    Types: Cigarettes   Smokeless tobacco: Never   Tobacco comments:    2PPD 11/04/2021  Vaping Use   Vaping Use: Never used  Substance and Sexual Activity   Alcohol use: No   Drug use: No   Sexual activity: Not on file  Other Topics Concern   Not on file  Social History Narrative   Not on file   Social Determinants of Health   Financial Resource Strain: Low Risk  (05/17/2021)   Overall Financial Resource Strain (CARDIA)    Difficulty of Paying Living Expenses: Not hard at all  Food Insecurity: No Food Insecurity (05/17/2021)   Hunger Vital Sign    Worried About Running Out of Food in the Last Year: Never true    [place] in the Last Year: Never true   Transportation Needs: No Transportation Needs (05/17/2021)   PRAPARE - Hydrologist (Medical): No    Lack of Transportation (Non-Medical): No  Physical Activity: Inactive (05/17/2021)   Exercise Vital Sign    Days of Exercise per Week: 0 days    Minutes of Exercise per Session: 0 min  Stress: No Stress Concern Present (05/17/2021)   [place]    Feeling of Stress : Not at all  Social Connections: Socially Isolated (05/17/2021)   Social Connection and Isolation Panel [NHANES]    Frequency of Communication with Friends and Family: Once a week    Frequency of Social Gatherings with Friends and Family: More than three times a week    Attends Religious Services: Never    Marine scientist or Organizations: No    Attends Music therapist: Never    Marital Status: Divorced     Family History: The patient's family history includes Hyperlipidemia in her father and mother; Melanoma in her sister. There is no history of Breast cancer.  ROS:   Please see the history of present illness.     All other systems reviewed and are negative.  EKGs/Labs/Other Studies Reviewed:    The following studies were reviewed today:  Echo 10/2021 1. Left ventricular ejection fraction, by estimation, is 50 to 55%. The  left ventricle has low normal function. The left ventricle has no regional  wall motion abnormalities. Left ventricular diastolic parameters are  consistent with Grade II diastolic  dysfunction (pseudonormalization).   2. Right ventricular systolic function is normal. The right ventricular  size is normal. Moderately increased right ventricular wall thickness.   3. The mitral valve is normal in structure. No evidence of mitral valve  regurgitation.   4. The aortic valve was not well visualized. Aortic valve regurgitation  is not visualized.   5. The inferior Stacey cava is normal in  size with greater than 50%  respiratory variability, suggesting right atrial pressure of 3 mmHg.   Heart monitor 09/2020 Patch Wear Time:  13 days and 18 hours (2022-05-26T11:32:57-0400 to 2022-06-09T06:09:45-0400)   Patient had a min HR of 47 bpm, max HR of 174 bpm, and avg HR of  81 bpm. Predominant underlying rhythm was Sinus Rhythm.  Occasional PACs and PVCs noted, no significant arrhythmias noted. Isolated SVEs were rare (<1.0%), SVE Couplets were rare (<1.0%), and SVE Triplets were rare (<1.0%).  Patient triggered events associated with sinus rhythm.  No significant arrhythmias noted.  Cardiac CTA 2021 IMPRESSION: 1. Coronary calcium score of 0. Patient is low risk for near term coronary events   2. Normal coronary origin with right dominance.   3. No evidence of CAD.   4. CAD-RADS 0. Consider non-atherosclerotic causes of chest pain.   Electronically Signed: By: Kate Sable M.D. On: 01/23/2020 13:17  EKG:  EKG is not ordered today.   Recent Labs: 09/29/2021: ALT 26; Magnesium 1.8; TSH 1.180 10/27/2021: B Natriuretic Peptide 49.2; BUN 15; Creatinine, Ser 1.05; Hemoglobin 14.3; Platelets 308; Potassium 4.4; Sodium 138  Recent Lipid Panel    Component Value Date/Time   CHOL 185 09/29/2021 0936   CHOL 194 02/22/2017 0847   TRIG 198 (H) 09/29/2021 0936   TRIG 227 (H) 02/22/2017 0847   HDL 39 (L) 09/29/2021 0936   CHOLHDL 4.1 05/13/2020 0934   VLDL 45 (H) 02/22/2017 0847   LDLCALC 111 (H) 09/29/2021 0936     Physical Exam:    VS:  BP 110/68 (BP Location: Left Arm, Patient Position: Sitting, Cuff Size: Normal)   Pulse 74   Ht 5' 7"  (1.702 m)   Wt 212 lb 9.6 oz (96.4 kg)   SpO2 90%   BMI 33.30 kg/m     Wt Readings from Last 3 Encounters:  11/24/21 212 lb 9.6 oz (96.4 kg)  11/04/21 209 lb 12.8 oz (95.2 kg)  10/27/21 208 lb (94.3 kg)     GEN:  Well nourished, well developed in no acute distress HEENT: Normal NECK: No JVD; No carotid bruits LYMPHATICS: No  lymphadenopathy CARDIAC: RRR, no murmurs, rubs, gallops RESPIRATORY:  Clear to auscultation without rales, wheezing or rhonchi  ABDOMEN: Soft, non-tender, non-distended MUSCULOSKELETAL:  No edema; No deformity  SKIN: Warm and dry NEUROLOGIC:  Alert and oriented x 3 PSYCHIATRIC:  Normal affect   ASSESSMENT:    1. Chronic diastolic heart failure ([place])   2. Diastolic dysfunction   3. Tobacco use   4. Chronic obstructive pulmonary disease, unspecified COPD type ([place])   5. Atypical chest pain   6. Essential hypertension    PLAN:    In order of problems listed above:  HFpEF Chronic diastolic dysfunction She has chronic left lower leg edema. She had prior stenting on the left side, which may be the cause. She takes lasix as needed for swelling or weight gain. Continue Government social research officer. No BB with pulmonary disease.   Tobacco use She is still smoking and doesn't plan to quit.   COPD She saw pulmonology earlier this month and O2 walking test was good with O2 >93%. The patient still feels extremely short of breath with daily activities and wakes up at night short of breath. She does not want a sleep study. O2 is 90% today. She will continue to follow with PCP and Pulmonology.   HTN BP is good today, continue current medications.   Atypical chest pain She has chronic atypical chest pain. Coronary CTA in 2021 showed no CAD. No further work-up at this time.   Disposition: Follow up in 3 month(s) with MD/APP     Signed, Luigi Stuckey Ninfa Meeker, PA-C  11/24/2021 2:01 PM    [place]

## 2021-11-24 NOTE — Patient Instructions (Signed)
Medication Instructions:  Your physician recommends that you continue on your current medications as directed. Please refer to the Current Medication list given to you today.  *If you need a refill on your cardiac medications before your next appointment, please call your pharmacy*   Lab Work: None ordered If you have labs (blood work) drawn today and your tests are completely normal, you will receive your results only by: [place] (if you have MyChart) OR A paper copy in the mail If you have any lab test that is abnormal or we need to change your treatment, we will call you to review the results.   Testing/Procedures: None ordered   Follow-Up: At [facility], you and your health needs are our priority.  As part of our continuing mission to provide you with exceptional heart care, we have created designated Provider Care Teams.  These Care Teams include your primary Cardiologist (physician) and Advanced Practice Providers (APPs -  Physician Assistants and Nurse Practitioners) who all work together to provide you with the care you need, when you need it.  We recommend signing up for the patient portal called "MyChart".  Sign up information is provided on this After Visit Summary.  MyChart is used to connect with patients for Virtual Visits (Telemedicine).  Patients are able to view lab/test results, encounter notes, upcoming appointments, etc.  Non-urgent messages can be sent to your provider as well.   To learn more about what you can do with MyChart, go to [email or web address].    Your next appointment:   3 month(s)  The format for your next appointment:   In Person  Provider:   You may see Kate Sable, MD or one of the following Advanced Practice Providers on your designated Care Team:   Murray Hodgkins, NP Christell Faith, PA-C Cadence Kathlen Mody, PA-C1}    Other Instructions N/A  Important Information About Sugar

## 2021-11-24 NOTE — Telephone Encounter (Signed)
Called patient to schedule her 3 month f/u appointment from her visit today, and she states she will not be back.and hung up the phone.

## 2021-11-29 DIAGNOSIS — M1712 Unilateral primary osteoarthritis, left knee: Secondary | ICD-10-CM | POA: Diagnosis not present

## 2021-11-30 ENCOUNTER — Telehealth: Payer: Self-pay

## 2021-11-30 DIAGNOSIS — J449 Chronic obstructive pulmonary disease, unspecified: Secondary | ICD-10-CM

## 2021-11-30 NOTE — Telephone Encounter (Signed)
ONO reviewed by Dr. Patsey Berthold-- recommend 2L QHS. Lowest spo2 76%. Patient is aware of results and voiced her understanding. She agrees with tx plan.  Order placed to [facility] for O2. Nothing further needed.

## 2021-12-02 DIAGNOSIS — J449 Chronic obstructive pulmonary disease, unspecified: Secondary | ICD-10-CM | POA: Diagnosis not present

## 2021-12-02 DIAGNOSIS — J9611 Chronic respiratory failure with hypoxia: Secondary | ICD-10-CM | POA: Diagnosis not present

## 2021-12-07 DIAGNOSIS — H2511 Age-related nuclear cataract, right eye: Secondary | ICD-10-CM | POA: Diagnosis not present

## 2021-12-07 DIAGNOSIS — H25811 Combined forms of age-related cataract, right eye: Secondary | ICD-10-CM | POA: Diagnosis not present

## 2021-12-15 ENCOUNTER — Ambulatory Visit: Payer: Self-pay | Admitting: *Deleted

## 2021-12-15 DIAGNOSIS — H2511 Age-related nuclear cataract, right eye: Secondary | ICD-10-CM | POA: Diagnosis not present

## 2021-12-15 NOTE — Telephone Encounter (Signed)
Pt called triage for: Summary: ? yeast infection   Patient thinks  dapagliflozin propanediol (FARXIGA) 10 MG TABS tablet caused a possible yeast infection. Patient experiencing itchiness and white discharged for 1 week. Patient stopped medication 4 days ago.  Patient requesting a prescription.  [facility] [place], [place]  Phone: [phone]  Fax: [phone]      Chief Complaint: yeast infection Symptoms: started when started farxiga Frequency: constant Pertinent Negatives: Patient denies odor, or colored drainage. Disposition: '[]'$ ED /'[]'$ Urgent Care (no appt availability in office) / '[]'$ Appointment(In office/virtual)/ '[]'$  [facility]/ '[x]'$ Home Care/ '[]'$ Refused Recommended Disposition /'[]'$ [facility]/ '[]'$  Follow-up with PCP Additional Notes: Pt states vaginal yeast infection symptoms since started [place]. She has stopped taking it but would like a prescription called in for the vaginitis, itching and discharge.   Reason for Disposition  [1] Symptoms of a yeast infection (i.e., itchy, white discharge, not bad smelling) AND [2] feels like prior vaginal yeast infections  Answer Assessment - Initial Assessment Questions 1. SYMPTOM: "What's the main symptom you're concerned about?" (e.g., pain, itching, dryness)     Vaginal itching and white discharge 2. LOCATION: "Where is the   located?" (e.g., inside/outside, left/right)     vagina 3. ONSET: "When did the  itching  start?"     Started after taking farxiga, has itched since 4. PAIN: "Is there any pain?" If Yes, ask: "How bad is it?" (Scale: 1-10; mild, moderate, severe)   -  MILD (1-3): Doesn't interfere with normal activities.    -  MODERATE (4-7): Interferes with normal activities (e.g., work or school) or awakens from sleep.     -  SEVERE (8-10): Excruciating pain, unable to do any normal activities.     no 5. ITCHING: "Is there any itching?" If Yes, ask: "How bad is it?" (Scale:  1-10; mild, moderate, severe)     yes 6. CAUSE: "What do you think is causing the discharge?" "Have you had the same problem before? What happened then?"     Farixga 7. OTHER SYMPTOMS: "Do you have any other symptoms?" (e.g., fever, itching, vaginal bleeding, pain with urination, injury to genital area, vaginal foreign body)     no 8. PREGNANCY: "Is there any chance you are pregnant?" "When was your last menstrual period?"     no  Protocols used: Vaginal Symptoms-A-AH

## 2021-12-16 ENCOUNTER — Ambulatory Visit (INDEPENDENT_AMBULATORY_CARE_PROVIDER_SITE_OTHER): Payer: Medicare Other | Admitting: Nurse Practitioner

## 2021-12-16 ENCOUNTER — Encounter: Payer: Self-pay | Admitting: Nurse Practitioner

## 2021-12-16 VITALS — BP 115/68 | HR 66 | Temp 97.4°F | Ht 67.0 in | Wt 219.2 lb

## 2021-12-16 DIAGNOSIS — B379 Candidiasis, unspecified: Secondary | ICD-10-CM | POA: Insufficient documentation

## 2021-12-16 LAB — WET PREP FOR TRICH, YEAST, CLUE
Clue Cell Exam: NEGATIVE
Trichomonas Exam: NEGATIVE
Yeast Exam: POSITIVE — AB

## 2021-12-16 MED ORDER — FLUCONAZOLE 150 MG PO TABS: 150.0000 mg | ORAL_TABLET | Freq: Once | ORAL | 0 refills | Status: AC

## 2021-12-16 NOTE — Telephone Encounter (Signed)
Pt scheduled  

## 2021-12-16 NOTE — Progress Notes (Signed)
BP 115/68   Pulse 66   Temp (!) 97.4 F (36.3 C) (Oral)   Ht '5\' 7"'$  (1.702 m)   Wt 219 lb 3.2 oz (99.4 kg)   SpO2 94%   BMI 34.33 kg/m    Subjective:    Patient ID: Stacey Gross, female    DOB: [birth year], 61 y.o.   MRN: [id number]  HPI: Stacey Gross is a 61 y.o. female  Chief Complaint  Patient presents with   Vaginitis    Patient is here for possible yeast infection. Patient says she is experiencing vaginal burning, vaginal irritation and vaginal itching. Patient says she first noticed it about 2 weeks ago. Patient says she blamed it on her medication Wilder Glade and she has since stopped the medication. Patient says she found online that this issues could be a possible side effect.    Vaginal Discharge   Gynecologic Exam   VAGINAL DISCHARGE Started with symptoms two weeks ago.  She stopped taking [place] as read this could be possible side effect, yeast infection.  Recent A1c 7.5% in June, which is when [place] was started.  Taking Januvia only at this time, in past did not tolerate GLP1 or Metformin due to GI side effects. Duration: weeks Discharge description: white  Pruritus: yes and burning Dysuria: no Malodorous: no Urinary frequency: no Fevers: no Abdominal pain: no  Sexual activity: not sexually active Recent antibiotic use: no Context: worse  Treatments attempted: none   Relevant past medical, surgical, family and social history reviewed and updated as indicated. Interim medical history since our last visit reviewed. Allergies and medications reviewed and updated.  Review of Systems  Constitutional:  Negative for activity change, appetite change, diaphoresis, fatigue and fever.  Respiratory:  Positive for cough (baseline) and shortness of breath. Negative for chest tightness and wheezing.   Cardiovascular:  Negative for chest pain, palpitations and leg swelling.  Gastrointestinal: Negative.   Endocrine: Negative for polydipsia, polyphagia and polyuria.   Genitourinary:  Positive for vaginal discharge. Negative for vaginal bleeding and vaginal pain.  Neurological: Negative.   Psychiatric/Behavioral: Negative.  Negative for sleep disturbance and suicidal ideas.     Per HPI unless specifically indicated above     Objective:    BP 115/68   Pulse 66   Temp (!) 97.4 F (36.3 C) (Oral)   Ht '5\' 7"'$  (1.702 m)   Wt 219 lb 3.2 oz (99.4 kg)   SpO2 94%   BMI 34.33 kg/m   Wt Readings from Last 3 Encounters:  12/16/21 219 lb 3.2 oz (99.4 kg)  11/24/21 212 lb 9.6 oz (96.4 kg)  11/04/21 209 lb 12.8 oz (95.2 kg)    Physical Exam Vitals and nursing note reviewed.  Constitutional:      General: She is awake.     Appearance: She is well-developed and well-groomed. She is obese.  HENT:     Head: Normocephalic.     Right Ear: Hearing and external ear normal.     Left Ear: Hearing and external ear normal.  Eyes:     General: Lids are normal.        Right eye: No discharge.        Left eye: No discharge.     Conjunctiva/sclera: Conjunctivae normal.     Pupils: Pupils are equal, round, and reactive to light.  Neck:     Thyroid: No thyromegaly.     Vascular: No carotid bruit.  Cardiovascular:     Rate and Rhythm:  Normal rate and regular rhythm.     Heart sounds: Normal heart sounds. No murmur heard.    No gallop.  Pulmonary:     Effort: Pulmonary effort is normal. No accessory muscle usage or respiratory distress.     Breath sounds: Wheezing present.     Comments: Scattered expiratory wheezes throughout noted - baseline. Abdominal:     General: Bowel sounds are normal.     Palpations: Abdomen is soft.  Musculoskeletal:     Cervical back: Normal range of motion and neck supple.     Right lower leg: No edema.     Left lower leg: No edema.  Skin:    General: Skin is warm and dry.     Comments: Scattered pale purple bruising bilateral upper extremities.  Neurological:     Mental Status: She is alert and oriented to person, place, and  time.  Psychiatric:        Attention and Perception: Attention normal.        Mood and Affect: Mood normal.        Speech: Speech normal.        Behavior: Behavior normal. Behavior is cooperative.        Thought Content: Thought content normal.    Results for orders placed or performed in visit on 12/16/21  WET PREP FOR [place], YEAST, CLUE   Specimen: Sterile Swab   Sterile Swab  Result Value Ref Range   Trichomonas Exam Negative Negative   Yeast Exam Positive (A) Negative   Clue Cell Exam Negative Negative      Assessment & Plan:   Problem List Items Addressed This Visit       Other   Yeast infection - Primary    Vaginal, wet prep + yeast.  Start Diflucan and may repeat in one week if needed.  Stop Wilder Glade and return to Metformin 500 MG BID for diabetes, return as scheduled for A1c check in upcoming weeks.      Relevant Medications   fluconazole (DIFLUCAN) 150 MG tablet   Other Relevant Orders   WET PREP FOR [place], YEAST, CLUE (Completed)     Follow up plan: Return for as scheduled.

## 2021-12-16 NOTE — Patient Instructions (Signed)

## 2021-12-16 NOTE — Assessment & Plan Note (Signed)
Vaginal, wet prep + yeast.  Start Diflucan and may repeat in one week if needed.  Stop Wilder Glade and return to Metformin 500 MG BID for diabetes, return as scheduled for A1c check in upcoming weeks.

## 2021-12-29 ENCOUNTER — Other Ambulatory Visit: Payer: Self-pay | Admitting: Medical

## 2021-12-29 ENCOUNTER — Institutional Professional Consult (permissible substitution): Payer: Medicare Other | Admitting: Pulmonary Disease

## 2021-12-30 ENCOUNTER — Ambulatory Visit: Payer: Medicare Other | Admitting: Nurse Practitioner

## 2021-12-30 DIAGNOSIS — K760 Fatty (change of) liver, not elsewhere classified: Secondary | ICD-10-CM

## 2021-12-30 DIAGNOSIS — I739 Peripheral vascular disease, unspecified: Secondary | ICD-10-CM

## 2021-12-30 DIAGNOSIS — I152 Hypertension secondary to endocrine disorders: Secondary | ICD-10-CM

## 2021-12-30 DIAGNOSIS — F411 Generalized anxiety disorder: Secondary | ICD-10-CM

## 2021-12-30 DIAGNOSIS — E1169 Type 2 diabetes mellitus with other specified complication: Secondary | ICD-10-CM

## 2021-12-30 DIAGNOSIS — F17219 Nicotine dependence, cigarettes, with unspecified nicotine-induced disorders: Secondary | ICD-10-CM

## 2021-12-30 DIAGNOSIS — J9611 Chronic respiratory failure with hypoxia: Secondary | ICD-10-CM

## 2021-12-30 DIAGNOSIS — D692 Other nonthrombocytopenic purpura: Secondary | ICD-10-CM

## 2021-12-30 DIAGNOSIS — E1151 Type 2 diabetes mellitus with diabetic peripheral angiopathy without gangrene: Secondary | ICD-10-CM

## 2021-12-30 DIAGNOSIS — E6609 Other obesity due to excess calories: Secondary | ICD-10-CM

## 2021-12-30 DIAGNOSIS — J432 Centrilobular emphysema: Secondary | ICD-10-CM

## 2022-01-01 DIAGNOSIS — J9611 Chronic respiratory failure with hypoxia: Secondary | ICD-10-CM | POA: Diagnosis not present

## 2022-01-01 DIAGNOSIS — J449 Chronic obstructive pulmonary disease, unspecified: Secondary | ICD-10-CM | POA: Diagnosis not present

## 2022-01-06 ENCOUNTER — Encounter: Payer: Self-pay | Admitting: Pulmonary Disease

## 2022-01-06 ENCOUNTER — Ambulatory Visit: Payer: Medicare Other | Admitting: Pulmonary Disease

## 2022-01-06 VITALS — BP 122/82 | HR 66 | Temp 98.0°F | Ht 67.0 in | Wt 215.6 lb

## 2022-01-06 DIAGNOSIS — G4736 Sleep related hypoventilation in conditions classified elsewhere: Secondary | ICD-10-CM

## 2022-01-06 DIAGNOSIS — F17210 Nicotine dependence, cigarettes, uncomplicated: Secondary | ICD-10-CM

## 2022-01-06 DIAGNOSIS — I5189 Other ill-defined heart diseases: Secondary | ICD-10-CM

## 2022-01-06 DIAGNOSIS — J449 Chronic obstructive pulmonary disease, unspecified: Secondary | ICD-10-CM | POA: Diagnosis not present

## 2022-01-06 DIAGNOSIS — J4489 Other specified chronic obstructive pulmonary disease: Secondary | ICD-10-CM | POA: Diagnosis not present

## 2022-01-06 NOTE — Patient Instructions (Signed)
Please check with your cardiologist as your echocardiogram has had some changes from prior.  Some of these issues may be aggravating her shortness of breath.  Action level stayed good at 92% today during your walk.  Continue using your oxygen at nighttime.  Continue using Breztri 2 puffs twice a day.  In the morning you may try using your rescue inhaler first thing in the morning and then taking the Breztri 20 minutes to 1 hour afterwards and see if that makes a difference.  We will see you in follow-up in 3 months time call sooner should any new problems arise.

## 2022-01-06 NOTE — Progress Notes (Signed)
Subjective:    Patient ID: Stacey Gross, female    DOB: [birth year], 61 y.o.   MRN: [id number] Patient Care Team: Marjie Skiff, NP as PCP - General (Nurse Practitioner) Debbe Odea, MD as PCP - Cardiology (Cardiology) Steele Sizer, MD (Family Medicine)  Chief Complaint  Patient presents with   Follow-up    COPD. SOB constant. Wheezing daily.     HPI The patient is a 61 year old current smoker (1 PPD, 96 PY) who follows with regards to the issue of shortness of breath.  She experienced increasing shortness of breath since 2022.  She states that initially she would get winded walking even 5 feet.  Her issues started in around April 2022 when she was noted to have pneumonia and required a hospitalization at [facility].  He was then evaluated by Dr. Sandrea Hughs on 21 September 2020.  Last visit with Dr. Sherene Sires was on 27 October 2020.  She was discharged from [facility] on oxygen at 2 L/min but was weaned off.  She uses albuterol at least once a day.  She was started on Breztri 2 puffs twice a day at her last visit with Dr. Sherene Sires.  I initially saw her on 04 November 2021 and at that time she did that Markus Daft has been effective.  She notes that when she gets short of breath resting is the most helpful maneuver.   She has had some chest tightness on occasion as well as tachypalpitations.  She follows with cardiology for these issues.  She also has grade 2 diastolic this function with chronic diastolic heart failure.  She has had no fevers, chills or sweats.  Cough has been productive of whitish to pale yellow sputum.  No lower extremity edema, no calf tenderness.  Overall she feels that her dyspnea is better since her prior visit.   She had an overnight oximetry performed 15 November 2021 that showed oxygen desaturations to as low as 76%.  She is on oxygen 2 L/min nocturnally after this testing.  She is compliant with therapy and notes benefit from it.  Prior to her visit in August she had had a 2D echo ordered  by her primary care provider Aura Dials, NP.  This showed that she had LVEF of 50 to 55% and grade 2 diastolic dysfunction.  This was changed from her prior 2D echo.  She was advised to follow-up with cardiology in this regard.  She has not done so yet.  This was reiterated to her today.  Reiterated that this may be adding to her issues with dyspnea.  Prior PFTs performed 29 October 2020 showed an FEV1 of 1.42 L or 50% predicted, FVC of 2.33 L or 64% predicted, FEV1/FVC of 61%.  She had no significant bronchodilator response.  She had air trapping and mild hyperinflation noted.  Diffusion capacity was moderately reduced.  Consistent with moderate obstructive airways disease and mild diffusion defect.  Since her prior visit she has dropped down from 2 packs of cigarettes per day smoking to 1 pack of cigarettes per day.  She continues to decrease the amount of cigarettes smoked per day.  She is encouraged to continue smoking cessation efforts.  Review of Systems A 10 point review of systems was performed and it is as noted above otherwise negative.  Patient Active Problem List   Diagnosis Date Noted   Esophageal dysphagia    Vitamin D deficiency 04/01/2021   Chronic respiratory failure with hypoxia and hypercapnia (HCC) 08/14/2020  PVD (peripheral vascular disease) (HCC) 07/25/2020   B12 deficiency 01/07/2020   Obesity 10/04/2019   Status post total shoulder arthroplasty, right 07/24/2019   Senile purpura (HCC) 06/17/2019   Hepatic steatosis 06/03/2019   Nummular dermatitis 05/28/2019   Centrilobular emphysema (HCC) 05/14/2019   Generalized anxiety disorder 12/17/2018   Type 2 diabetes, controlled, with peripheral circulatory disorder (HCC) 03/21/2018   Hx of colonic polyps    Ectopic gastric tissue    Aortic atherosclerosis (HCC) 04/18/2017   Advanced care planning/counseling discussion 02/22/2017   Blue toe syndrome (HCC) 03/04/2016   GERD (gastroesophageal reflux disease) 12/30/2014    Hyperlipidemia associated with type 2 diabetes mellitus (HCC)    Hypertension associated with diabetes (HCC)    Degenerative joint disease (DJD) of hip    S/P lumbar fusion 07/02/2013   Nicotine dependence, cigarettes, w unsp disorders 02/23/2010   Social History   Tobacco Use   Smoking status: Every Day    Packs/day: 2.00    Years: 48.00    Total pack years: 96.00    Types: Cigarettes   Smokeless tobacco: Never   Tobacco comments:    2PPD 11/04/2021    1PDD 01/06/2022  Substance Use Topics   Alcohol use: No   Allergies  Allergen Reactions   Doxycycline Hyclate Anaphylaxis, Swelling and Rash   Acetaminophen    Aspirin    Latex Rash    Gloves, underwear elastic, tape   Vancomycin Rash    All mycins   Current Meds  Medication Sig   Accu-Chek Softclix Lancets lancets USE TO CHECK BLOOD SUGAR DAILY   albuterol (PROVENTIL) (2.5 MG/3ML) 0.083% nebulizer solution USE 1 VIAL (3 ML = 2.5 MG TOTAL) BY NEBULIZATION EVERY 6 HOURS AS NEEDED FORWHEEZING OR SHORTNESS OF BREATH   albuterol (VENTOLIN HFA) 108 (90 Base) MCG/ACT inhaler INHALE 2 PUFFS BY MOUTH INTO THE LUNGS EVERY 6 HOURS AS NEEDED FOR WHEEZING OR SHORTNESS OF BREATH   Blood Glucose Monitoring Suppl (ACCU-CHEK GUIDE ME) w/Device KIT Use to check blood sugars 2-3 times daily with goals = <130 fasting in morning and <180 two hours after eating.  Bring blood sugar log to appointments.   Budeson-Glycopyrrol-Formoterol (BREZTRI AEROSPHERE) 160-9-4.8 MCG/ACT AERO Inhale 2 puffs into the lungs in the morning and at bedtime.   Cholecalciferol 1.25 MG (50000 UT) TABS Take 1 tablet by mouth once a week.   citalopram (CELEXA) 20 MG tablet Take 1 tablet (20 mg total) by mouth daily.   furosemide (LASIX) 20 MG tablet TAKE 1 TABLET EVERY DAY AS NEEDED   glucose blood test strip Use to check blood sugar three times a day.   hydrOXYzine (VISTARIL) 25 MG capsule Take 1 capsule (25 mg total) by mouth every 8 (eight) hours as needed.    lactobacillus acidophilus & bulgar (LACTINEX) chewable tablet Chew 1 tablet by mouth 3 (three) times daily as needed (take after every stools).   metFORMIN (GLUCOPHAGE) 500 MG tablet Take 500 mg by mouth 2 (two) times daily.   nystatin (MYCOSTATIN) 100000 UNIT/ML suspension Take 5 mLs (500,000 Units total) by mouth 4 (four) times daily.   olmesartan (BENICAR) 5 MG tablet Take 1 tablet (5 mg total) by mouth daily.   pantoprazole (PROTONIX) 40 MG tablet Take 1 tablet (40 mg total) by mouth daily.   prochlorperazine (COMPAZINE) 10 MG tablet Take 10 mg by mouth every 6 (six) hours as needed.   rosuvastatin (CRESTOR) 40 MG tablet Take 1 tablet (40 mg total) by mouth daily.  sitaGLIPtin (JANUVIA) 100 MG tablet Take 1 tablet (100 mg total) by mouth daily.   traZODone (DESYREL) 100 MG tablet TAKE 1 TABLET BY MOUTH AT BEDTIME AS NEEDED FOR SLEEP   verapamil (CALAN-SR) 120 MG CR tablet Take 1 tablet (120 mg total) by mouth at bedtime.   vitamin B-12 (CYANOCOBALAMIN) 1000 MCG tablet Take 1 tablet (1,000 mcg total) by mouth daily.   Immunization History  Administered Date(s) Administered   Influenza,inj,Quad PF,6+ Mos 02/09/2016, 02/22/2017, 05/09/2018, 12/17/2018, 01/07/2020   Pneumococcal Polysaccharide-23 02/09/2016   Td 04/04/2004, 05/13/2014   Zoster Recombinat (Shingrix) 01/07/2019, 04/22/2019       Objective:   Physical Exam BP 122/82 (BP Location: Left Arm, Cuff Size: Normal)   Pulse 66   Temp 98 F (36.7 C)   Ht  (1.702 m)   Wt 215 lb 9.6 oz (97.8 kg)   SpO2 96%   BMI 33.77 kg/m  GENERAL: Well-developed, obese, fully ambulatory, no conversational dyspnea. HEAD: Normocephalic, atraumatic.  EYES: Pupils equal, round, reactive to light.  No scleral icterus.  MOUTH: Nose/mouth/throat not examined due to institutional masking requirements COVID-19. NECK: Supple. No thyromegaly. Trachea midline. No JVD.  No adenopathy. PULMONARY: Distant breath sounds bilaterally.  Coarse otherwise,  no adventitious sounds. CARDIOVASCULAR: S1 and S2. Regular rate and rhythm.  No rubs, murmurs or gallops heard. ABDOMEN: Obese otherwise benign MUSCULOSKELETAL: No joint deformity, no clubbing, no edema.  NEUROLOGIC: No overt focal deficit, no gait disturbance, speech is fluent. SKIN: Intact,warm,dry. PSYCH: Mood and behavior normal.    Ambulatory oximetry was performed today: Resting O2 sat 94% on room air, resting heart rate 67 bpm.  Patient ambulated 750 feet at moderate pace experiencing mild shortness of breath during the walk.  O2 nadir 92% at end of testing.  Maximal heart rate 96 bpm.    Assessment & Plan:     ICD-10-CM   1. COPD mixed type (HCC)  J44.9    Continue Breztri 2 puffs twice a day Continue as needed albuterol Continue efforts to quit smoking    2. 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 Diastolic dysfunction  I51.89    This issue adds complexity to her management As to her issues with dyspnea Recommend patient follow-up with cardiology    4. Tobacco dependence due to cigarettes  F17.210    Patient counseled regards to discontinuation of smoking She is actively engaged in smoking cessation efforts     Patient appears to be fairly well compensated at present.  She was encouraged to continue with smoking cessation efforts.  We will see her in follow-up in 3 months time she is to call sooner should any new problems arise.  Gailen Shelter, MD Advanced Bronchoscopy PCCM [facility]    *This note was dictated using voice recognition software/Dragon.  Despite best efforts to proofread, errors can occur which can change the meaning. Any transcriptional errors that result from this process are unintentional and may not be fully corrected at the time of dictation.

## 2022-01-21 ENCOUNTER — Ambulatory Visit
Admission: RE | Admit: 2022-01-21 | Discharge: 2022-01-21 | Disposition: A | Discharge status: Home / Self Care | Payer: Medicare Other | Source: Ambulatory Visit | Attending: Acute Care | Admitting: Acute Care

## 2022-01-21 DIAGNOSIS — Z122 Encounter for screening for malignant neoplasm of respiratory organs: Secondary | ICD-10-CM | POA: Diagnosis not present

## 2022-01-21 DIAGNOSIS — F17210 Nicotine dependence, cigarettes, uncomplicated: Secondary | ICD-10-CM | POA: Diagnosis not present

## 2022-01-21 DIAGNOSIS — Z87891 Personal history of nicotine dependence: Secondary | ICD-10-CM | POA: Diagnosis not present

## 2022-01-24 ENCOUNTER — Other Ambulatory Visit: Payer: Self-pay | Admitting: Acute Care

## 2022-01-24 DIAGNOSIS — F17210 Nicotine dependence, cigarettes, uncomplicated: Secondary | ICD-10-CM

## 2022-01-24 DIAGNOSIS — Z87891 Personal history of nicotine dependence: Secondary | ICD-10-CM

## 2022-01-24 DIAGNOSIS — Z122 Encounter for screening for malignant neoplasm of respiratory organs: Secondary | ICD-10-CM

## 2022-01-27 DIAGNOSIS — H25042 Posterior subcapsular polar age-related cataract, left eye: Secondary | ICD-10-CM | POA: Diagnosis not present

## 2022-01-27 DIAGNOSIS — H2512 Age-related nuclear cataract, left eye: Secondary | ICD-10-CM | POA: Diagnosis not present

## 2022-01-27 DIAGNOSIS — H25012 Cortical age-related cataract, left eye: Secondary | ICD-10-CM | POA: Diagnosis not present

## 2022-01-27 LAB — HM DIABETES EYE EXAM

## 2022-01-30 DIAGNOSIS — I5032 Chronic diastolic (congestive) heart failure: Secondary | ICD-10-CM | POA: Insufficient documentation

## 2022-01-30 NOTE — Patient Instructions (Signed)
Diabetes Mellitus Basics  Diabetes mellitus, or diabetes, is a long-term (chronic) disease. It occurs when the body does not properly use sugar (glucose) that is released from food after you eat. Diabetes mellitus may be caused by one or both of these problems: Your pancreas does not make enough of a hormone called insulin. Your body does not react in a normal way to the insulin that it makes. Insulin lets glucose enter cells in your body. This gives you energy. If you have diabetes, glucose cannot get into cells. This causes high blood glucose (hyperglycemia). How to treat and manage diabetes You may need to take insulin or other diabetes medicines daily to keep your glucose in balance. If you are prescribed insulin, you will learn how to give yourself insulin by injection. You may need to adjust the amount of insulin you take based on the foods that you eat. You will need to check your blood glucose levels using a glucose monitor as told by your health care provider. The readings can help determine if you have low or high blood glucose. Generally, you should have these blood glucose levels: Before meals (preprandial): 80-130 mg/dL (4.4-7.2 mmol/L). After meals (postprandial): below 180 mg/dL (10 mmol/L). Hemoglobin A1c (HbA1c) level: less than 7%. Your health care provider will set treatment goals for you. Keep all follow-up visits. This is important. Follow these instructions at home: Diabetes medicines Take your diabetes medicines every day as told by your health care provider. List your diabetes medicines here: Name of medicine: ______________________________ Amount (dose): _______________ Time (a.m./p.m.): _______________ Notes: ___________________________________ Name of medicine: ______________________________ Amount (dose): _______________ Time (a.m./p.m.): _______________ Notes: ___________________________________ Name of medicine: ______________________________ Amount (dose):  _______________ Time (a.m./p.m.): _______________ Notes: ___________________________________ Insulin If you use insulin, list the types of insulin you use here: Insulin type: ______________________________ Amount (dose): _______________ Time (a.m./p.m.): _______________Notes: ___________________________________ Insulin type: ______________________________ Amount (dose): _______________ Time (a.m./p.m.): _______________ Notes: ___________________________________ Insulin type: ______________________________ Amount (dose): _______________ Time (a.m./p.m.): _______________ Notes: ___________________________________ Insulin type: ______________________________ Amount (dose): _______________ Time (a.m./p.m.): _______________ Notes: ___________________________________ Insulin type: ______________________________ Amount (dose): _______________ Time (a.m./p.m.): _______________ Notes: ___________________________________ Managing blood glucose  Check your blood glucose levels using a glucose monitor as told by your health care provider. Write down the times that you check your glucose levels here: Time: _______________ Notes: ___________________________________ Time: _______________ Notes: ___________________________________ Time: _______________ Notes: ___________________________________ Time: _______________ Notes: ___________________________________ Time: _______________ Notes: ___________________________________ Time: _______________ Notes: ___________________________________  Low blood glucose Low blood glucose (hypoglycemia) is when glucose is at or below 70 mg/dL (3.9 mmol/L). Symptoms may include: Feeling: Hungry. Sweaty and clammy. Irritable or easily upset. Dizzy. Sleepy. Having: A fast heartbeat. A headache. A change in your vision. Numbness around the mouth, lips, or tongue. Having trouble with: Moving (coordination). Sleeping. Treating low blood glucose To treat low blood  glucose, eat or drink something containing sugar right away. If you can think clearly and swallow safely, follow the 15:15 rule: Take 15 grams of a fast-acting carb (carbohydrate), as told by your health care provider. Some fast-acting carbs are: Glucose tablets: take 3-4 tablets. Hard candy: eat 3-5 pieces. Fruit juice: drink 4 oz (120 mL). Regular (not diet) soda: drink 4-6 oz (120-180 mL). Honey or sugar: eat 1 Tbsp (15 mL). Check your blood glucose levels 15 minutes after you take the carb. If your glucose is still at or below 70 mg/dL (3.9 mmol/L), take 15 grams of a carb again. If your glucose does not go above 70 mg/dL (3.9 mmol/L) after   3 tries, get help right away. After your glucose goes back to normal, eat a meal or a snack within 1 hour. Treating very low blood glucose If your glucose is at or below 54 mg/dL (3 mmol/L), you have very low blood glucose (severe hypoglycemia). This is an emergency. Do not wait to see if the symptoms will go away. Get medical help right away. Call your local emergency services (911 in the [place] Do not drive yourself to the hospital. Questions to ask your health care provider Should I talk with a diabetes educator? What equipment will I need to care for myself at home? What diabetes medicines do I need? When should I take them? How often do I need to check my blood glucose levels? What number can I call if I have questions? When is my follow-up visit? Where can I find a support group for people with diabetes? Where to find more information American Diabetes Association: www.diabetes.org Association of Diabetes Care and Education Specialists: www.diabeteseducator.org Contact a health care provider if: Your blood glucose is at or above 240 mg/dL (13.3 mmol/L) for 2 days in a row. You have been sick or have had a fever for 2 days or more, and you are not getting better. You have any of these problems for more than 6 hours: You cannot eat or  drink. You feel nauseous. You vomit. You have diarrhea. Get help right away if: Your blood glucose is lower than 54 mg/dL (3 mmol/L). You get confused. You have trouble thinking clearly. You have trouble breathing. These symptoms may represent a serious problem that is an emergency. Do not wait to see if the symptoms will go away. Get medical help right away. Call your local emergency services (911 in the [place] Do not drive yourself to the hospital. Summary Diabetes mellitus is a chronic disease that occurs when the body does not properly use sugar (glucose) that is released from food after you eat. Take insulin and diabetes medicines as told. Check your blood glucose every day, as often as told. Keep all follow-up visits. This is important. This information is not intended to replace advice given to you by your health care provider. Make sure you discuss any questions you have with your health care provider. Document Revised: 07/23/2019 Document Reviewed: 07/23/2019 Elsevier Patient Education  2023 Elsevier Inc.  

## 2022-01-31 ENCOUNTER — Encounter (INDEPENDENT_AMBULATORY_CARE_PROVIDER_SITE_OTHER): Payer: Self-pay

## 2022-01-31 ENCOUNTER — Ambulatory Visit (INDEPENDENT_AMBULATORY_CARE_PROVIDER_SITE_OTHER): Payer: Medicare Other | Admitting: Nurse Practitioner

## 2022-01-31 ENCOUNTER — Encounter: Payer: Self-pay | Admitting: Nurse Practitioner

## 2022-01-31 VITALS — BP 107/73 | HR 76 | Temp 98.5°F | Ht 67.01 in | Wt 218.0 lb

## 2022-01-31 DIAGNOSIS — E1169 Type 2 diabetes mellitus with other specified complication: Secondary | ICD-10-CM

## 2022-01-31 DIAGNOSIS — D692 Other nonthrombocytopenic purpura: Secondary | ICD-10-CM | POA: Diagnosis not present

## 2022-01-31 DIAGNOSIS — J449 Chronic obstructive pulmonary disease, unspecified: Secondary | ICD-10-CM | POA: Diagnosis not present

## 2022-01-31 DIAGNOSIS — E785 Hyperlipidemia, unspecified: Secondary | ICD-10-CM | POA: Diagnosis not present

## 2022-01-31 DIAGNOSIS — Z6833 Body mass index (BMI) 33.0-33.9, adult: Secondary | ICD-10-CM

## 2022-01-31 DIAGNOSIS — E1159 Type 2 diabetes mellitus with other circulatory complications: Secondary | ICD-10-CM

## 2022-01-31 DIAGNOSIS — I739 Peripheral vascular disease, unspecified: Secondary | ICD-10-CM

## 2022-01-31 DIAGNOSIS — I75023 Atheroembolism of bilateral lower extremities: Secondary | ICD-10-CM | POA: Diagnosis not present

## 2022-01-31 DIAGNOSIS — F17219 Nicotine dependence, cigarettes, with unspecified nicotine-induced disorders: Secondary | ICD-10-CM

## 2022-01-31 DIAGNOSIS — E6609 Other obesity due to excess calories: Secondary | ICD-10-CM

## 2022-01-31 DIAGNOSIS — E1151 Type 2 diabetes mellitus with diabetic peripheral angiopathy without gangrene: Secondary | ICD-10-CM | POA: Diagnosis not present

## 2022-01-31 DIAGNOSIS — J432 Centrilobular emphysema: Secondary | ICD-10-CM | POA: Diagnosis not present

## 2022-01-31 DIAGNOSIS — I152 Hypertension secondary to endocrine disorders: Secondary | ICD-10-CM | POA: Diagnosis not present

## 2022-01-31 DIAGNOSIS — I5032 Chronic diastolic (congestive) heart failure: Secondary | ICD-10-CM | POA: Diagnosis not present

## 2022-01-31 DIAGNOSIS — J9611 Chronic respiratory failure with hypoxia: Secondary | ICD-10-CM

## 2022-01-31 DIAGNOSIS — J9612 Chronic respiratory failure with hypercapnia: Secondary | ICD-10-CM

## 2022-01-31 DIAGNOSIS — F411 Generalized anxiety disorder: Secondary | ICD-10-CM

## 2022-01-31 LAB — BAYER DCA HB A1C WAIVED: HB A1C (BAYER DCA - WAIVED): 8.2 % — ABNORMAL HIGH (ref 4.8–5.6)

## 2022-01-31 NOTE — Assessment & Plan Note (Signed)
Chronic, ongoing with A1c upward trend at 8.2% today, previous was 7.5%.  Urine ALB 30 and A:C <30 in March 2023.  Recommend she take Olmesartan daily for kidney protection.  Increase Metformin to 1000 MG BID (two tablets, she was able to verbalize this back) and continue Januvia 100 MG daily, educated her on this.  She did not tolerate GLP1 or SGLT2 in past.  Check BS BID.  Monitor diet at home and reduce carbs and foods high in sugar.  Continue to collaborate with vascular. She prefers not to start insulin. Return in 3 months.

## 2022-01-31 NOTE — Assessment & Plan Note (Signed)
BMI 34.14.  Recommended eating smaller high protein, low fat meals more frequently and exercising 30 mins a day 5 times a week with a goal of 10-15lb weight loss in the next 3 months. Patient voiced their understanding and motivation to adhere to these recommendations.

## 2022-01-31 NOTE — Assessment & Plan Note (Signed)
Chronic, ongoing.  Continue collaboration with vascular.  Recommend she return for visit with them -- she is working on scheduling.

## 2022-01-31 NOTE — Assessment & Plan Note (Signed)
Chronic, ongoing.  Highly recommend she continue Crestor and adjust dosing as needed or add on Zetia if poor control.  Lipid check today.

## 2022-01-31 NOTE — Assessment & Plan Note (Signed)
Per cardiology notes on review.  She is to follow-up with them in 3 months, but reports dissatisfaction with recent visit with them, recommend she return.  At this time continue current medication regimen and recommend: - Reminded to call for an overnight weight gain of >2 pounds or a weekly weight gain of >5 pounds - not adding salt to food and read food labels. Reviewed the importance of keeping daily sodium intake to '2000mg'$  daily. - Avoid NSAIDs

## 2022-01-31 NOTE — Assessment & Plan Note (Signed)
Chronic, stable.  BP well at goal in office today.  Recommend she monitor BP at least a few mornings a week at home and document.  DASH diet at home.  Continue current medication regimen and adjust as needed.  Labs today: CBC and BMP.  Return in 3 months.

## 2022-01-31 NOTE — Assessment & Plan Note (Addendum)
Continue to collaborate with pulmonary.  Tolerating O2 at night well.

## 2022-01-31 NOTE — Progress Notes (Signed)
BP 107/73   Pulse 76   Temp 98.5 F (36.9 C) (Oral)   Ht 5' 7.01" (1.702 m)   Wt 218 lb (98.9 kg)   SpO2 90%   BMI 34.14 kg/m    Subjective:    Patient ID: Stacey Gross, female    DOB: 29-Jul-1960, 61 y.o.   MRN: [id number]  HPI: [name] is a 61 y.o. female  Chief Complaint  Patient presents with   Diabetes   Hyperlipidemia   COPD   DIABETES A1c June 7.5%.  Continues on Metformin 500 MG BID and Januvia 100 MG daily.   She does endorse the Metformin does cause bowel movements.  In past GLP1 medications caused stomach issues and Farxiga caused yeast infections. Hypoglycemic episodes:no Polydipsia/polyuria: no Visual disturbance: no Chest pain: no Paresthesias: no Glucose Monitoring: yes             Accucheck frequency: twice a week             Fasting glucose: 160 this morning, often running this range             Post prandial:             Evening:             Before meals: Taking Insulin?: no             Long acting insulin:             Short acting insulin: Blood Pressure Monitoring: not checking Retinal Examination: Up To Date -- having cataract surgery upcoming Foot Exam: Up to Date Pneumovax: Up to Date Influenza: Not Up to Date -- refuses Aspirin: no    HYPERTENSION / HYPERLIPIDEMIA/CHF Was on Olmesartan 5 MG, Verapamil 120 MG daily, Lasix 20 MG PRN, and Crestor 20 MG daily.  Followed by cardiology and last saw 11/24/21, no changes.  EF on echo June 2022 was 60-65%.  To follow-up with them in 3 months.  Followed by vascular for her blue toe syndrome and last saw February 2021, to follow annually -- she plans on seeing after Christmas. Satisfied with current treatment? yes Duration of hypertension: chronic BP monitoring frequency: not checking BP range:  BP medication side effects: no Duration of hyperlipidemia: chronic Cholesterol medication side effects: no Cholesterol supplements: none Medication compliance: good compliance Aspirin:  no Recent stressors: no Recurrent headaches: no Visual changes: no Palpitations: occasional Dyspnea: some worsening Chest pain: none Lower extremity edema: occasional Dizzy/lightheaded: no   COPD Continues to smoke < 1 PPD, not interested in quitting -- has been smoking since age 1.  Lung CT screening 01/21/22 == emphysema and aortic atherosclerosis -- she is to repeat in 12 months.  Saw pulmonary with last visit 01/06/22-- they recommended to continue Breztri.  Plus Albuterol as needed. COPD status: stable Satisfied with current treatment?: yes Oxygen use: no Dyspnea frequency: occasional at baseline with activity Cough frequency: at baseline Rescue inhaler frequency:  every day Limitation of activity: no Productive cough: no Last Spirometry: with pulmonary Pneumovax: Up to Date Influenza: Refuses  ANXIETY/STRESS Currently only taking Celexa daily, this is offering benefit. Duration:stable Anxious mood: yes  Excessive worrying: no Irritability: no, not if taking Celexa Sweating: no Nausea: no Palpitations:no Hyperventilation: no Panic attacks: no Agoraphobia: no  Obscessions/compulsions: no Depressed mood: no    01/31/2022    2:44 PM 05/17/2021   11:44 AM 03/31/2021   11:56 AM 05/15/2020   11:17 AM 05/13/2020    9:23  AM  Depression screen PHQ 2/9  Decreased Interest 0 0 0 0 3  Down, Depressed, Hopeless 0 0 0 0 0  PHQ - 2 Score 0 0 0 0 3  Altered sleeping 0 0 0  3  Tired, decreased energy 0 0 0  3  Change in appetite 0 0 0  0  Feeling bad or failure about yourself  0 0 1  0  Trouble concentrating 0 0 0  3  Moving slowly or fidgety/restless 0 0 0  2  Suicidal thoughts 0 0 0  0  PHQ-9 Score 0 0 1  14  Difficult doing work/chores Not difficult at all Not difficult at all Not difficult at all    Anhedonia: no Weight changes: no Insomnia: yes hard to fall asleep  Hypersomnia: no Fatigue/loss of energy: no Feelings of worthlessness: no Feelings of guilt:  no Impaired concentration/indecisiveness: no Suicidal ideations: no  Crying spells: no Recent Stressors/Life Changes: no   Relationship problems: no   Family stress: no     Financial stress: no    Job stress: no    Recent death/loss: no    02/01/2022    2:45 PM 03/31/2021   11:56 AM 05/13/2020    9:47 AM 12/17/2018    2:30 PM  GAD 7 : Generalized Anxiety Score  Nervous, Anxious, on Edge 0 0 3 2  Control/stop worrying 0 '1 3 2  '$ Worry too much - different things 0 '1 3 2  '$ Trouble relaxing 0 0 3 2  Restless 0 0 2 2  Easily annoyed or irritable 0 '2 3 2  '$ Afraid - awful might happen 0 0 1 0  Total GAD 7 Score 0 '4 18 12  '$ Anxiety Difficulty Not difficult at all Not difficult at all  Somewhat difficult   Relevant past medical, surgical, family and social history reviewed and updated as indicated. Interim medical history since our last visit reviewed. Allergies and medications reviewed and updated.  Review of Systems  Constitutional:  Negative for activity change, appetite change, diaphoresis, fatigue and fever.  Respiratory:  Positive for cough (baseline). Negative for chest tightness, shortness of breath and wheezing.   Cardiovascular:  Positive for leg swelling (occasional). Negative for chest pain and palpitations.  Gastrointestinal: Negative.   Endocrine: Negative for polydipsia, polyphagia and polyuria.  Neurological: Negative.   Psychiatric/Behavioral: Negative.  Negative for sleep disturbance and suicidal ideas.     Per HPI unless specifically indicated above     Objective:    BP 107/73   Pulse 76   Temp 98.5 F (36.9 C) (Oral)   Ht 5' 7.01" (1.702 m)   Wt 218 lb (98.9 kg)   SpO2 90%   BMI 34.14 kg/m   Wt Readings from Last 3 Encounters:  01-Feb-2022 218 lb (98.9 kg)  01/06/22 215 lb 9.6 oz (97.8 kg)  12/16/21 219 lb 3.2 oz (99.4 kg)    Physical Exam Vitals and nursing note reviewed.  Constitutional:      General: She is awake.     Appearance: She is  well-developed and well-groomed. She is obese.  HENT:     Head: Normocephalic.     Right Ear: Hearing, tympanic membrane, ear canal and external ear normal.     Left Ear: Hearing, tympanic membrane, ear canal and external ear normal.     Nose:     Right Sinus: No maxillary sinus tenderness or frontal sinus tenderness.     Left Sinus: No maxillary sinus  tenderness or frontal sinus tenderness.  Eyes:     General: Lids are normal.        Right eye: No discharge.        Left eye: No discharge.     Conjunctiva/sclera: Conjunctivae normal.     Pupils: Pupils are equal, round, and reactive to light.  Neck:     Thyroid: No thyromegaly.     Vascular: No carotid bruit.  Cardiovascular:     Rate and Rhythm: Normal rate and regular rhythm.     Heart sounds: Normal heart sounds. No murmur heard.    No gallop.  Pulmonary:     Effort: Pulmonary effort is normal. No accessory muscle usage or respiratory distress.     Breath sounds: Wheezing present.     Comments: Scattered expiratory wheezes throughout noted - baseline. Abdominal:     General: Bowel sounds are normal.     Palpations: Abdomen is soft.  Musculoskeletal:     Cervical back: Normal range of motion and neck supple.     Right lower leg: No edema.     Left lower leg: No edema.  Skin:    General: Skin is warm and dry.     Comments: Scattered pale purple bruising bilateral upper extremities.  Neurological:     Mental Status: She is alert and oriented to person, place, and time.  Psychiatric:        Attention and Perception: Attention normal.        Mood and Affect: Mood normal.        Speech: Speech normal.        Behavior: Behavior normal. Behavior is cooperative.        Thought Content: Thought content normal.    Results for orders placed or performed in visit on 01/31/22  Bayer DCA Hb A1c Waived  Result Value Ref Range   HB A1C (BAYER DCA - WAIVED) 8.2 (H) 4.8 - 5.6 %      Assessment & Plan:   Problem List Items  Addressed This Visit       Cardiovascular and Mediastinum   Blue toe syndrome (HCC)    Chronic, ongoing.  Continue collaboration with vascular.  Recommend she return for visit with them -- she is working on scheduling.      Relevant Orders   Basic metabolic panel   Lipid Panel w/o Chol/HDL Ratio   Chronic diastolic heart failure [facility])    Per cardiology notes on review.  She is to follow-up with them in 3 months, but reports dissatisfaction with recent visit with them, recommend she return.  At this time continue current medication regimen and recommend: - Reminded to call for an overnight weight gain of >2 pounds or a weekly weight gain of >5 pounds - not adding salt to food and read food labels. Reviewed the importance of keeping daily sodium intake to '2000mg'$  daily. - Avoid NSAIDs      Relevant Orders   Basic metabolic panel   Lipid Panel w/o Chol/HDL Ratio   Hypertension associated with diabetes (HCC)    Chronic, stable.  BP well at goal in office today.  Recommend she monitor BP at least a few mornings a week at home and document.  DASH diet at home.  Continue current medication regimen and adjust as needed.  Labs today: CBC and BMP.  Return in 3 months.      Relevant Orders   Bayer DCA Hb A1c Waived (Completed)   Basic metabolic panel  PVD (peripheral vascular disease) (HCC)    Chronic, stable, followed by vascular.  Recommend she schedule follow-up with them. Continue statin and ASA daily.      Relevant Orders   Basic metabolic panel   Lipid Panel w/o Chol/HDL Ratio   Senile purpura (HCC)    Bilateral upper extremity.  Recommend gentle skin cleansing daily and application of lotion daily.  Monitor skin and notify provider if any abrasions or wounds.      Type 2 diabetes, controlled, with peripheral circulatory disorder (HCC) - Primary    Chronic, ongoing with A1c upward trend at 8.2% today, previous was 7.5%.  Urine ALB 30 and A:C <30 in March 2023.  Recommend she take  Olmesartan daily for kidney protection.  Increase Metformin to 1000 MG BID (two tablets, she was able to verbalize this back) and continue Januvia 100 MG daily, educated her on this.  She did not tolerate GLP1 or SGLT2 in past.  Check BS BID.  Monitor diet at home and reduce carbs and foods high in sugar.  Continue to collaborate with vascular. She prefers not to start insulin. Return in 3 months.      Relevant Orders   Bayer DCA Hb A1c Waived (Completed)     Respiratory   Centrilobular emphysema (HCC)    Chronic, ongoing.  Continue Breztri daily and Albuterol as needed.  Recommend complete cessation of smoking.  Continue annual CT scans.  Continue collaboration with pulmonary, which is beneficial.        Relevant Orders   Basic metabolic panel   CBC with Differential/Platelet   Chronic respiratory failure with hypoxia and hypercapnia (HCC)    Continue to collaborate with pulmonary.  Tolerating O2 at night well.      Relevant Orders   CBC with Differential/Platelet     Endocrine   Hyperlipidemia associated with type 2 diabetes mellitus (HCC)    Chronic, ongoing.  Highly recommend she continue Crestor and adjust dosing as needed or add on Zetia if poor control.  Lipid check today.      Relevant Orders   Bayer DCA Hb A1c Waived (Completed)   Lipid Panel w/o Chol/HDL Ratio     Nervous and Auditory   Nicotine dependence, cigarettes, w unsp disorders    I have recommended complete cessation of tobacco use. I have discussed various options available for assistance with tobacco cessation including over the counter methods (Nicotine gum, patch and lozenges). We also discussed prescription options (Chantix, Nicotine Inhaler / Nasal Spray). The patient is not interested in pursuing any prescription tobacco cessation options at this time.  Continue yearly lung screening.         Other   Generalized anxiety disorder    Chronic, stable.  Recommend continue Celexa daily instead of as needed  to achieve full affect and overall help mood, she agrees with this plan.  Overall mood stable.  Denies SI/HI.      Obesity    BMI 34.14.  Recommended eating smaller high protein, low fat meals more frequently and exercising 30 mins a day 5 times a week with a goal of 10-15lb weight loss in the next 3 months. Patient voiced their understanding and motivation to adhere to these recommendations.          Follow up plan: Return in about 3 months (around 05/03/2022) for T2DM, HTN/HLD, MOOD, GERD, COPD, VIT D.

## 2022-01-31 NOTE — Assessment & Plan Note (Addendum)
Chronic, ongoing.  Continue Breztri daily and Albuterol as needed.  Recommend complete cessation of smoking.  Continue annual CT scans.  Continue collaboration with pulmonary, which is beneficial.

## 2022-01-31 NOTE — Assessment & Plan Note (Signed)
Bilateral upper extremity.  Recommend gentle skin cleansing daily and application of lotion daily.  Monitor skin and notify provider if any abrasions or wounds.

## 2022-01-31 NOTE — Assessment & Plan Note (Signed)
I have recommended complete cessation of tobacco use. I have discussed various options available for assistance with tobacco cessation including over the counter methods (Nicotine gum, patch and lozenges). We also discussed prescription options (Chantix, Nicotine Inhaler / Nasal Spray). The patient is not interested in pursuing any prescription tobacco cessation options at this time.  Continue yearly lung screening.

## 2022-01-31 NOTE — Assessment & Plan Note (Signed)
Chronic, stable.  Recommend continue Celexa daily instead of as needed to achieve full affect and overall help mood, she agrees with this plan.  Overall mood stable.  Denies SI/HI.

## 2022-01-31 NOTE — Assessment & Plan Note (Signed)
Chronic, stable, followed by vascular.  Recommend she schedule follow-up with them. Continue statin and ASA daily.

## 2022-02-01 LAB — BASIC METABOLIC PANEL
BUN/Creatinine Ratio: 11 — ABNORMAL LOW (ref 12–28)
BUN: 14 mg/dL (ref 8–27)
CO2: 26 mmol/L (ref 20–29)
Calcium: 9.6 mg/dL (ref 8.7–10.3)
Chloride: 98 mmol/L (ref 96–106)
Creatinine, Ser: 1.24 mg/dL — ABNORMAL HIGH (ref 0.57–1.00)
Glucose: 116 mg/dL — ABNORMAL HIGH (ref 70–99)
Potassium: 4.6 mmol/L (ref 3.5–5.2)
Sodium: 140 mmol/L (ref 134–144)
eGFR: 49 mL/min/{1.73_m2} — ABNORMAL LOW (ref >59)

## 2022-02-01 LAB — CBC WITH DIFFERENTIAL/PLATELET
Basophils Absolute: 0.1 10*3/uL (ref 0.0–0.2)
Basos: 1 %
EOS (ABSOLUTE): 0.1 10*3/uL (ref 0.0–0.4)
Eos: 1 %
Hematocrit: 38.7 % (ref 34.0–46.6)
Hemoglobin: 13.4 g/dL (ref 11.1–15.9)
Immature Grans (Abs): 0 10*3/uL (ref 0.0–0.1)
Immature Granulocytes: 0 %
Lymphocytes Absolute: 2.7 10*3/uL (ref 0.7–3.1)
Lymphs: 22 %
MCH: 30.4 pg (ref 26.6–33.0)
MCHC: 34.6 g/dL (ref 31.5–35.7)
MCV: 88 fL (ref 79–97)
Monocytes Absolute: 0.5 10*3/uL (ref 0.1–0.9)
Monocytes: 4 %
Neutrophils Absolute: 8.7 10*3/uL — ABNORMAL HIGH (ref 1.4–7.0)
Neutrophils: 72 %
Platelets: 274 10*3/uL (ref 150–450)
RBC: 4.41 x10E6/uL (ref 3.77–5.28)
RDW: 12.2 % (ref 11.7–15.4)
WBC: 12.1 10*3/uL — ABNORMAL HIGH (ref 3.4–10.8)

## 2022-02-01 LAB — LIPID PANEL W/O CHOL/HDL RATIO
Cholesterol, Total: 127 mg/dL (ref 100–199)
HDL: 47 mg/dL (ref >39)
LDL Chol Calc (NIH): 54 mg/dL (ref 0–99)
Triglycerides: 150 mg/dL — ABNORMAL HIGH (ref 0–149)
VLDL Cholesterol Cal: 26 mg/dL (ref 5–40)

## 2022-02-01 NOTE — Progress Notes (Signed)
Contacted via [place] afternoon Stacey Gross, your labs have returned: - Kidney function, creatinine and eGFR, remains has decline slightly to who some mild kidney disease Stage 3a -- continue current medications as Olmesartan is kidney protective.   - CBC continues to show mild elevation in white blood cell count and neutrophils -- we will monitor these closely and recheck next visit -- suspect this is from smoking for many years. - Cholesterol levels show LDL at goal, continue statin therapy. Overall maintain current medications and no changes needed.  Any questions? Keep being amazing!!  Thank you for allowing me to participate in your care.  I appreciate you. Kindest regards, Dottie Vaquerano

## 2022-02-23 ENCOUNTER — Encounter: Payer: Self-pay | Admitting: Physician Assistant

## 2022-02-23 ENCOUNTER — Ambulatory Visit: Payer: Self-pay | Admitting: *Deleted

## 2022-02-23 ENCOUNTER — Ambulatory Visit (INDEPENDENT_AMBULATORY_CARE_PROVIDER_SITE_OTHER): Payer: Medicare Other | Admitting: Physician Assistant

## 2022-02-23 VITALS — BP 140/75 | HR 76 | Temp 98.7°F | Wt 212.0 lb

## 2022-02-23 DIAGNOSIS — R109 Unspecified abdominal pain: Secondary | ICD-10-CM

## 2022-02-23 DIAGNOSIS — R82998 Other abnormal findings in urine: Secondary | ICD-10-CM | POA: Diagnosis not present

## 2022-02-23 DIAGNOSIS — R1032 Left lower quadrant pain: Secondary | ICD-10-CM | POA: Diagnosis not present

## 2022-02-23 LAB — URINALYSIS, ROUTINE W REFLEX MICROSCOPIC
Bilirubin, UA: NEGATIVE
Glucose, UA: NEGATIVE
Ketones, UA: NEGATIVE
Leukocytes,UA: NEGATIVE
Nitrite, UA: NEGATIVE
RBC, UA: NEGATIVE
Specific Gravity, UA: 1.02 (ref 1.005–1.030)
Urobilinogen, Ur: 1 mg/dL (ref 0.2–1.0)
pH, UA: 6 (ref 5.0–7.5)

## 2022-02-23 LAB — MICROSCOPIC EXAMINATION
Bacteria, UA: NONE SEEN
RBC, Urine: NONE SEEN /hpf (ref 0–2)

## 2022-02-23 MED ORDER — TRAMADOL HCL 50 MG PO TABS: 50.0000 mg | ORAL_TABLET | Freq: Three times a day (TID) | ORAL | 0 refills | Status: AC | PRN

## 2022-02-23 NOTE — Telephone Encounter (Signed)
Reason for Disposition  Blood in urine (red, pink, or tea-colored)  Answer Assessment - Initial Assessment Questions 1. LOCATION: "Where does it hurt?"      Lower, below umbilicus 2. RADIATION: "Does the pain shoot anywhere else?" (e.g., chest, back)     Unsure 3. ONSET: "When did the pain begin?" (e.g., minutes, hours or days ago)      1 week 4. SUDDEN: "Gradual or sudden onset?"     Suddenly 5. PATTERN "Does the pain come and go, or is it constant?"    - If it comes and goes: "How long does it last?" "Do you have pain now?"     (Note: Comes and goes means the pain is intermittent. It goes away completely between bouts.)    - If constant: "Is it getting better, staying the same, or getting worse?"      (Note: Constant means the pain never goes away completely; most serious pain is constant and gets worse.)      Comes and goes 6. SEVERITY: "How bad is the pain?"  (e.g., Scale 1-10; mild, moderate, or severe)    - MILD (1-3): Doesn't interfere with normal activities, abdomen soft and not tender to touch.     - MODERATE (4-7): Interferes with normal activities or awakens from sleep, abdomen tender to touch.     - SEVERE (8-10): Excruciating pain, doubled over, unable to do any normal activities.        7. RECURRENT SYMPTOM: "Have you ever had this type of stomach pain before?" If Yes, ask: "When was the last time?" and "What happened that time?"       8. CAUSE: "What do you think is causing the stomach pain?"      9. RELIVING/AGGRAVATING FACTORS: "What makes it better or worse?" (e.g., antacids, bending or twisting motion, bowel movement)      10. OTHER SYMPTOMS: "Do you have any other symptoms?" (e.g., back pain, diarrhea, fever, urination pain, vomiting)     Urine dark, lower back pain  Protocols used: Abdominal Pain - Female-A-AH

## 2022-02-23 NOTE — Patient Instructions (Addendum)
I have placed an order for a CT scan of your urinary tract as I am suspicious you may have a kidney stone It will likely be at the address listed below but please be on the look out for a phone call to help schedule it in the next few hours  [place], [place] [id number]   Please stay well hydrated and avoid holding your urine Please drink at least 75 oz of water per day and you can add a Pedialyte once per day as needed for further hydration   If your pain becomes more severe or you start having difficulty urinating, fever, nausea, vomiting, please go to the ED for evaluation and management

## 2022-02-23 NOTE — Telephone Encounter (Signed)
  Chief Complaint: Abdominal pain Symptoms: Lower abdominal pain,lower back pain, urine dark, pain comes and goes, no appetite. Frequency: 1 week Pertinent Negatives: Patient denies fever, dysuria Disposition: '[]'$ ED /'[]'$ Urgent Care (no appt availability in office) / '[x]'$ Appointment(In office/virtual)/ '[]'$  [facility]/ '[]'$ Home Care/ '[]'$ Refused Recommended Disposition /'[]'$ [place] [facility]/ '[]'$  Follow-up with PCP Additional Notes: Appt secured for this AM. Care advise provided, pt verbalizes understanding.

## 2022-02-23 NOTE — Progress Notes (Signed)
Acute Office Visit   Patient: Stacey Gross   DOB: 1960-04-05   61 y.o. Female  MRN: [id number] Visit Date: 02/23/2022  Today's healthcare provider: Dani Gobble Valor Quaintance, PA-C  Introduced myself to the patient as a Journalist, newspaper and provided education on APPs in clinical practice.    Chief Complaint  Patient presents with   Abdominal Pain    Patient states she is having lower left abdominal pain, states she is nauseous, has diarrhea and dark urine for about 1 week. Patient is not sure if diarrhea is coming from recently upping her dose of metformin about 3 weeks ago.    Subjective    HPI HPI     Abdominal Pain    Additional comments: Patient states she is having lower left abdominal pain, states she is nauseous, has diarrhea and dark urine for about 1 week. Patient is not sure if diarrhea is coming from recently upping her dose of metformin about 3 weeks ago.       Last edited by Louanna Raw, [place] on 02/23/2022 10:23 AM.     Left lower abdominal pain  Reports associated nausea, diarrhea, dark-colored urine for the past week   Reports severe pain of left lower abdomen  Onset: sudden  Duration: about a week ago  Pain level and character: 10/10 sharp- reports she feels bloated and full of gas  Alleviating: tries not to move off the cough, tramadol Aggravating: eating or drinking - starts a few minutes after PO intake  Unsure if she is having fever or chills as she states she is always cold  Interventions: Tramadol  Reviewed previous CT abdomen from 04/27/21  no evidence of kidney stones but she did have small cyst in lower pole of right kidney  She recently increased her Metformin to 1000 mg PO BID on 01/31/22 which may be contributing to diarrhea.  She reports she has decreased her Metformin back to 500 mg PO BID due to diarrhea   She has a hx of appendectomy and hysterectomy She has had kidney stones in the past    Medications: Outpatient Medications Prior to Visit   Medication Sig   Accu-Chek Softclix Lancets lancets USE TO CHECK BLOOD SUGAR DAILY   albuterol (PROVENTIL) (2.5 MG/3ML) 0.083% nebulizer solution USE 1 VIAL (3 ML = 2.5 MG TOTAL) BY NEBULIZATION EVERY 6 HOURS AS NEEDED FORWHEEZING OR SHORTNESS OF BREATH   albuterol (VENTOLIN HFA) 108 (90 Base) MCG/ACT inhaler INHALE 2 PUFFS BY MOUTH INTO THE LUNGS EVERY 6 HOURS AS NEEDED FOR WHEEZING OR SHORTNESS OF BREATH   Blood Glucose Monitoring Suppl (ACCU-CHEK GUIDE ME) w/Device KIT Use to check blood sugars 2-3 times daily with goals = <130 fasting in morning and <180 two hours after eating.  Bring blood sugar log to appointments.   Budeson-Glycopyrrol-Formoterol (BREZTRI AEROSPHERE) 160-9-4.8 MCG/ACT AERO Inhale 2 puffs into the lungs in the morning and at bedtime.   Cholecalciferol 1.25 MG (50000 UT) TABS Take 1 tablet by mouth once a week.   citalopram (CELEXA) 20 MG tablet Take 1 tablet (20 mg total) by mouth daily.   furosemide (LASIX) 20 MG tablet TAKE 1 TABLET EVERY DAY AS NEEDED   glucose blood test strip Use to check blood sugar three times a day.   hydrOXYzine (VISTARIL) 25 MG capsule Take 1 capsule (25 mg total) by mouth every 8 (eight) hours as needed.   lactobacillus acidophilus & bulgar (LACTINEX) chewable tablet Chew 1 tablet  by mouth 3 (three) times daily as needed (take after every stools).   metFORMIN (GLUCOPHAGE) 500 MG tablet Take 500 mg by mouth 2 (two) times daily.   nystatin (MYCOSTATIN) 100000 UNIT/ML suspension Take 5 mLs (500,000 Units total) by mouth 4 (four) times daily.   olmesartan (BENICAR) 5 MG tablet Take 1 tablet (5 mg total) by mouth daily.   pantoprazole (PROTONIX) 40 MG tablet Take 1 tablet (40 mg total) by mouth daily.   prochlorperazine (COMPAZINE) 10 MG tablet Take 10 mg by mouth every 6 (six) hours as needed.   rosuvastatin (CRESTOR) 40 MG tablet Take 1 tablet (40 mg total) by mouth daily.   sitaGLIPtin (JANUVIA) 100 MG tablet Take 1 tablet (100 mg total) by mouth  daily.   traZODone (DESYREL) 100 MG tablet TAKE 1 TABLET BY MOUTH AT BEDTIME AS NEEDED FOR SLEEP   verapamil (CALAN-SR) 120 MG CR tablet Take 1 tablet (120 mg total) by mouth at bedtime.   vitamin B-12 (CYANOCOBALAMIN) 1000 MCG tablet Take 1 tablet (1,000 mcg total) by mouth daily.   No facility-administered medications prior to visit.    Review of Systems  Constitutional:  Positive for appetite change.  Gastrointestinal:  Positive for abdominal distention, abdominal pain, diarrhea (reports she is going "20 times per day"), nausea and vomiting. Negative for blood in stool and constipation.  Genitourinary:  Positive for flank pain and hematuria. Negative for decreased urine volume, difficulty urinating, dysuria, enuresis, frequency and urgency.       Objective    BP (!) 140/75   Pulse 76   Temp 98.7 F (37.1 C)   Wt 212 lb (96.2 kg)   SpO2 95%   BMI 33.20 kg/m    Physical Exam Vitals reviewed.  Constitutional:      General: She is awake.     Appearance: Normal appearance. She is well-developed.  HENT:     Head: Normocephalic and atraumatic.  Cardiovascular:     Rate and Rhythm: Normal rate and regular rhythm.     Pulses: Normal pulses.     Heart sounds: Normal heart sounds. No murmur heard.    No friction rub. No gallop.  Pulmonary:     Effort: Pulmonary effort is normal.     Breath sounds: Normal breath sounds. No decreased air movement. No decreased breath sounds, wheezing, rhonchi or rales.  Abdominal:     General: Abdomen is protuberant. Bowel sounds are normal.     Palpations: Abdomen is soft. There is no shifting dullness, fluid wave or mass.     Tenderness: There is abdominal tenderness in the left lower quadrant. There is left CVA tenderness. There is no right CVA tenderness.  Neurological:     General: No focal deficit present.     Mental Status: She is alert and oriented to person, place, and time.     Cranial Nerves: No dysarthria or facial asymmetry.      Motor: No weakness or tremor.     Coordination: Coordination is intact.     Gait: Gait is intact.  Psychiatric:        Mood and Affect: Mood normal.        Behavior: Behavior normal. Behavior is cooperative.        Thought Content: Thought content normal.        Judgment: Judgment normal.       Results for orders placed or performed in visit on 02/23/22  Microscopic Examination   Urine  Result Value Ref Range  WBC, UA 0-5 0 - 5 /hpf   RBC, Urine None seen 0 - 2 /hpf   Epithelial Cells (non renal) 0-10 0 - 10 /hpf   Casts Present (A) None seen /lpf   Cast Type Hyaline casts N/A   Bacteria, UA None seen None seen/Few  Urinalysis, Routine w reflex microscopic  Result Value Ref Range   Specific Gravity, UA 1.020 1.005 - 1.030   pH, UA 6.0 5.0 - 7.5   Color, UA Yellow Yellow   Appearance Ur Clear Clear   Leukocytes,UA Negative Negative   Protein,UA 1+ (A) Negative/Trace   Glucose, UA Negative Negative   Ketones, UA Negative Negative   RBC, UA Negative Negative   Bilirubin, UA Negative Negative   Urobilinogen, Ur 1.0 0.2 - 1.0 mg/dL   Nitrite, UA Negative Negative   Microscopic Examination See below:     Assessment & Plan      No follow-ups on file.     Problem List Items Addressed This Visit   None Visit Diagnoses     Left lower quadrant abdominal pain    -  Primary Acute, new concern Reports associated nausea, left CVA tenderness, and dark urine over the past week Suspicious for kidney stone at this time as UA results were not indicative of UTI -reviewed UA results with patient during apt  Will send for CT stone study to rule this out and to rule out potential intra-abdominal processes as well- results to dictate further management  Recommend she stay well hydrated and avoid holding urine for prolonged periods of time Will provide Tramadol 50 mg PO TID PRN for pain management Reviewed ED and return precautions- patient voiced understanding Follow up as needed for  persistent or progressing symptoms    Relevant Medications   traMADol (ULTRAM) 50 MG tablet   Other Relevant Orders   CT RENAL STONE STUDY   Acute left flank pain     See LLQ pain for management plan    Relevant Medications   traMADol (ULTRAM) 50 MG tablet   Other Relevant Orders   CT RENAL STONE STUDY   Dark urine     UA was negative for hematuria or signs of UTI  Will pursue kidney stone rule out at this time     Relevant Orders   Urinalysis, Routine w reflex microscopic (Completed)   CT RENAL STONE STUDY        No follow-ups on file.   I, Sanjana Folz E Dennise Raabe, PA-C, have reviewed all documentation for this visit. The documentation on 02/23/22 for the exam, diagnosis, procedures, and orders are all accurate and complete.   Talitha Givens, MHS, PA-C [facility]

## 2022-02-28 ENCOUNTER — Telehealth: Payer: Self-pay | Admitting: Nurse Practitioner

## 2022-02-28 NOTE — Telephone Encounter (Signed)
I spoke with patient to discuss scheduling STAT CT. She stated that she is feeling much better because she is only taking 1 pill of the metformin instead of 4. Please follow up with patient. Patient stated that she does not need CT.

## 2022-03-03 DIAGNOSIS — J449 Chronic obstructive pulmonary disease, unspecified: Secondary | ICD-10-CM | POA: Diagnosis not present

## 2022-03-03 DIAGNOSIS — J9611 Chronic respiratory failure with hypoxia: Secondary | ICD-10-CM | POA: Diagnosis not present

## 2022-04-02 DIAGNOSIS — J9611 Chronic respiratory failure with hypoxia: Secondary | ICD-10-CM | POA: Diagnosis not present

## 2022-04-02 DIAGNOSIS — J449 Chronic obstructive pulmonary disease, unspecified: Secondary | ICD-10-CM | POA: Diagnosis not present

## 2022-04-15 ENCOUNTER — Other Ambulatory Visit: Payer: Self-pay | Admitting: Nurse Practitioner

## 2022-04-15 NOTE — Telephone Encounter (Signed)
Requested Prescriptions  Pending Prescriptions Disp Refills   verapamil (CALAN-SR) 120 MG CR tablet [Pharmacy Med Name: VERAPAMIL HCL ER 120 MG TAB] 90 tablet 0    Sig: TAKE ONE TABLET BY MOUTH AT BEDTIME     Cardiovascular: Calcium Channel Blockers 3 Failed - 04/15/2022 12:25 PM      Failed - Cr in normal range and within 360 days    Creat  Date Value Ref Range Status  07/02/2013 0.79 0.50 - 1.10 mg/dL Final   Creatinine, Ser  Date Value Ref Range Status  01/31/2022 1.24 (H) 0.57 - 1.00 mg/dL Final         Failed - Last BP in normal range    BP Readings from Last 1 Encounters:  02/23/22 (!) 140/75         Passed - ALT in normal range and within 360 days    ALT  Date Value Ref Range Status  09/29/2021 26 0 - 32 IU/L Final   ALT (SGPT) Piccolo, Waived  Date Value Ref Range Status  02/22/2017 19 10 - 47 U/L Final         Passed - AST in normal range and within 360 days    AST  Date Value Ref Range Status  09/29/2021 33 0 - 40 IU/L Final   AST (SGOT) Piccolo, Waived  Date Value Ref Range Status  02/22/2017 32 11 - 38 U/L Final         Passed - Last Heart Rate in normal range    Pulse Readings from Last 1 Encounters:  02/23/22 76         Passed - Valid encounter within last 6 months    Recent Outpatient Visits           1 month ago Left lower quadrant abdominal pain   [facility] Mecum, Erin E, PA-C   2 months ago Type 2 diabetes, controlled, with peripheral circulatory disorder ([place])   [place], Barbaraann Faster, NP   4 months ago Yeast infection   [facility], Nadine T, NP   5 months ago Type 2 diabetes, controlled, with peripheral circulatory disorder ([place])   [place], Jolene T, NP   6 months ago Type 2 diabetes, controlled, with peripheral circulatory disorder ([place])   [place], [place] T, NP       Future Appointments             In 2 weeks Cannady, Barbaraann Faster, NP [facility], [facility]             albuterol (VENTOLIN HFA) 108 (90 Base) MCG/ACT inhaler [Pharmacy Med Name: ALBUTEROL SULFATE HFA 108 (90 BASE)] 8.5 g 3    Sig: INHALE 2 PUFFS BY MOUTH INTO THE LUNGS EVERY 6 HOURS AS NEEDED FOR WHEEZING OR SHORTNESS OF BREATH     Pulmonology:  Beta Agonists 2 Failed - 04/15/2022 12:25 PM      Failed - Last BP in normal range    BP Readings from Last 1 Encounters:  02/23/22 (!) 140/75         Passed - Last Heart Rate in normal range    Pulse Readings from Last 1 Encounters:  02/23/22 76         Passed - Valid encounter within last 12 months    Recent Outpatient Visits           1 month ago Left lower quadrant abdominal pain  [place], PA-C   2 months ago Type 2 diabetes, controlled, with peripheral circulatory disorder ([place])   [place] Cannady, Barbaraann Faster, NP   4 months ago Yeast infection   [employer], [place] T, NP   5 months ago Type 2 diabetes, controlled, with peripheral circulatory disorder ([place])   [place], Jolene T, NP   6 months ago Type 2 diabetes, controlled, with peripheral circulatory disorder ([place])   [place], Jolene T, NP       Future Appointments             In 2 weeks Cannady, Barbaraann Faster, NP [place], [facility]             albuterol (PROVENTIL) (2.5 MG/3ML) 0.083% nebulizer solution [Pharmacy Med Name: ALBUTEROL SULFATE (2.5 MG/3ML) 0.08] 180 mL 0    Sig: USE 1 VIAL (3 ML = 2.5 MG TOTAL) BY NEBULIZATION EVERY 6 HOURS AS NEEDED FORWHEEZING OR SHORTNESS OF BREATH     Pulmonology:  Beta Agonists 2 Failed - 04/15/2022 12:25 PM      Failed - Last BP in normal range    BP Readings from Last 1 Encounters:  02/23/22 (!) 140/75         Passed - Last Heart Rate in normal range    Pulse Readings from Last 1 Encounters:  02/23/22 76         Passed - Valid encounter within last 12  months    Recent Outpatient Visits           1 month ago Left lower quadrant abdominal pain   [facility] Mecum, Erin E, PA-C   2 months ago Type 2 diabetes, controlled, with peripheral circulatory disorder ([place])   [place] Cannady, Henrine Screws T, NP   4 months ago Yeast infection   [employer], [place] T, NP   5 months ago Type 2 diabetes, controlled, with peripheral circulatory disorder ([place])   [place] Cannady, Jolene T, NP   6 months ago Type 2 diabetes, controlled, with peripheral circulatory disorder ([place])   [place] Cannady, Barbaraann Faster, NP       Future Appointments             In 2 weeks Cannady, Barbaraann Faster, NP [facility], [facility]

## 2022-04-26 DIAGNOSIS — H2512 Age-related nuclear cataract, left eye: Secondary | ICD-10-CM | POA: Diagnosis not present

## 2022-04-26 DIAGNOSIS — H25812 Combined forms of age-related cataract, left eye: Secondary | ICD-10-CM | POA: Diagnosis not present

## 2022-04-26 DIAGNOSIS — H25012 Cortical age-related cataract, left eye: Secondary | ICD-10-CM | POA: Diagnosis not present

## 2022-04-30 NOTE — Patient Instructions (Incomplete)
Diabetes Mellitus Basics  Diabetes mellitus, or diabetes, is a long-term (chronic) disease. It occurs when the body does not properly use sugar (glucose) that is released from food after you eat. Diabetes mellitus may be caused by one or both of these problems: Your pancreas does not make enough of a hormone called insulin. Your body does not react in a normal way to the insulin that it makes. Insulin lets glucose enter cells in your body. This gives you energy. If you have diabetes, glucose cannot get into cells. This causes high blood glucose (hyperglycemia). How to treat and manage diabetes You may need to take insulin or other diabetes medicines daily to keep your glucose in balance. If you are prescribed insulin, you will learn how to give yourself insulin by injection. You may need to adjust the amount of insulin you take based on the foods that you eat. You will need to check your blood glucose levels using a glucose monitor as told by your health care provider. The readings can help determine if you have low or high blood glucose. Generally, you should have these blood glucose levels: Before meals (preprandial): 80-130 mg/dL (4.4-7.2 mmol/L). After meals (postprandial): below 180 mg/dL (10 mmol/L). Hemoglobin A1c (HbA1c) level: less than 7%. Your health care provider will set treatment goals for you. Keep all follow-up visits. This is important. Follow these instructions at home: Diabetes medicines Take your diabetes medicines every day as told by your health care provider. List your diabetes medicines here: Name of medicine: ______________________________ Amount (dose): _______________ Time (a.m./p.m.): _______________ Notes: ___________________________________ Name of medicine: ______________________________ Amount (dose): _______________ Time (a.m./p.m.): _______________ Notes: ___________________________________ Name of medicine: ______________________________ Amount (dose):  _______________ Time (a.m./p.m.): _______________ Notes: ___________________________________ Insulin If you use insulin, list the types of insulin you use here: Insulin type: ______________________________ Amount (dose): _______________ Time (a.m./p.m.): _______________Notes: ___________________________________ Insulin type: ______________________________ Amount (dose): _______________ Time (a.m./p.m.): _______________ Notes: ___________________________________ Insulin type: ______________________________ Amount (dose): _______________ Time (a.m./p.m.): _______________ Notes: ___________________________________ Insulin type: ______________________________ Amount (dose): _______________ Time (a.m./p.m.): _______________ Notes: ___________________________________ Insulin type: ______________________________ Amount (dose): _______________ Time (a.m./p.m.): _______________ Notes: ___________________________________ Managing blood glucose  Check your blood glucose levels using a glucose monitor as told by your health care provider. Write down the times that you check your glucose levels here: Time: _______________ Notes: ___________________________________ Time: _______________ Notes: ___________________________________ Time: _______________ Notes: ___________________________________ Time: _______________ Notes: ___________________________________ Time: _______________ Notes: ___________________________________ Time: _______________ Notes: ___________________________________  Low blood glucose Low blood glucose (hypoglycemia) is when glucose is at or below 70 mg/dL (3.9 mmol/L). Symptoms may include: Feeling: Hungry. Sweaty and clammy. Irritable or easily upset. Dizzy. Sleepy. Having: A fast heartbeat. A headache. A change in your vision. Numbness around the mouth, lips, or tongue. Having trouble with: Moving (coordination). Sleeping. Treating low blood glucose To treat low blood  glucose, eat or drink something containing sugar right away. If you can think clearly and swallow safely, follow the 15:15 rule: Take 15 grams of a fast-acting carb (carbohydrate), as told by your health care provider. Some fast-acting carbs are: Glucose tablets: take 3-4 tablets. Hard candy: eat 3-5 pieces. Fruit juice: drink 4 oz (120 mL). Regular (not diet) soda: drink 4-6 oz (120-180 mL). Honey or sugar: eat 1 Tbsp (15 mL). Check your blood glucose levels 15 minutes after you take the carb. If your glucose is still at or below 70 mg/dL (3.9 mmol/L), take 15 grams of a carb again. If your glucose does not go above 70 mg/dL (3.9 mmol/L) after   3 tries, get help right away. After your glucose goes back to normal, eat a meal or a snack within 1 hour. Treating very low blood glucose If your glucose is at or below 54 mg/dL (3 mmol/L), you have very low blood glucose (severe hypoglycemia). This is an emergency. Do not wait to see if the symptoms will go away. Get medical help right away. Call your local emergency services (911 in the [place] Do not drive yourself to the hospital. Questions to ask your health care provider Should I talk with a diabetes educator? What equipment will I need to care for myself at home? What diabetes medicines do I need? When should I take them? How often do I need to check my blood glucose levels? What number can I call if I have questions? When is my follow-up visit? Where can I find a support group for people with diabetes? Where to find more information American Diabetes Association: www.diabetes.org Association of Diabetes Care and Education Specialists: www.diabeteseducator.org Contact a health care provider if: Your blood glucose is at or above 240 mg/dL (13.3 mmol/L) for 2 days in a row. You have been sick or have had a fever for 2 days or more, and you are not getting better. You have any of these problems for more than 6 hours: You cannot eat or  drink. You feel nauseous. You vomit. You have diarrhea. Get help right away if: Your blood glucose is lower than 54 mg/dL (3 mmol/L). You get confused. You have trouble thinking clearly. You have trouble breathing. These symptoms may represent a serious problem that is an emergency. Do not wait to see if the symptoms will go away. Get medical help right away. Call your local emergency services (911 in the [place] Do not drive yourself to the hospital. Summary Diabetes mellitus is a chronic disease that occurs when the body does not properly use sugar (glucose) that is released from food after you eat. Take insulin and diabetes medicines as told. Check your blood glucose every day, as often as told. Keep all follow-up visits. This is important. This information is not intended to replace advice given to you by your health care provider. Make sure you discuss any questions you have with your health care provider. Document Revised: 07/23/2019 Document Reviewed: 07/23/2019 Elsevier Patient Education  2023 [employer]  

## 2022-05-03 ENCOUNTER — Ambulatory Visit: Payer: 59 | Admitting: Nurse Practitioner

## 2022-05-03 DIAGNOSIS — E559 Vitamin D deficiency, unspecified: Secondary | ICD-10-CM

## 2022-05-03 DIAGNOSIS — H2512 Age-related nuclear cataract, left eye: Secondary | ICD-10-CM | POA: Diagnosis not present

## 2022-05-03 DIAGNOSIS — E538 Deficiency of other specified B group vitamins: Secondary | ICD-10-CM

## 2022-05-03 DIAGNOSIS — J9612 Chronic respiratory failure with hypercapnia: Secondary | ICD-10-CM

## 2022-05-03 DIAGNOSIS — E1169 Type 2 diabetes mellitus with other specified complication: Secondary | ICD-10-CM

## 2022-05-03 DIAGNOSIS — E1151 Type 2 diabetes mellitus with diabetic peripheral angiopathy without gangrene: Secondary | ICD-10-CM

## 2022-05-03 DIAGNOSIS — I739 Peripheral vascular disease, unspecified: Secondary | ICD-10-CM

## 2022-05-03 DIAGNOSIS — I700 Atherosclerosis of aorta: Secondary | ICD-10-CM

## 2022-05-03 DIAGNOSIS — E1159 Type 2 diabetes mellitus with other circulatory complications: Secondary | ICD-10-CM

## 2022-05-03 DIAGNOSIS — I75023 Atheroembolism of bilateral lower extremities: Secondary | ICD-10-CM

## 2022-05-03 DIAGNOSIS — I5032 Chronic diastolic (congestive) heart failure: Secondary | ICD-10-CM

## 2022-05-03 DIAGNOSIS — F411 Generalized anxiety disorder: Secondary | ICD-10-CM

## 2022-05-03 DIAGNOSIS — D692 Other nonthrombocytopenic purpura: Secondary | ICD-10-CM

## 2022-05-03 DIAGNOSIS — K760 Fatty (change of) liver, not elsewhere classified: Secondary | ICD-10-CM

## 2022-05-03 DIAGNOSIS — E6609 Other obesity due to excess calories: Secondary | ICD-10-CM

## 2022-05-03 DIAGNOSIS — J449 Chronic obstructive pulmonary disease, unspecified: Secondary | ICD-10-CM | POA: Diagnosis not present

## 2022-05-03 DIAGNOSIS — J9611 Chronic respiratory failure with hypoxia: Secondary | ICD-10-CM | POA: Diagnosis not present

## 2022-05-03 DIAGNOSIS — J432 Centrilobular emphysema: Secondary | ICD-10-CM

## 2022-05-04 ENCOUNTER — Encounter: Payer: Self-pay | Admitting: Nurse Practitioner

## 2022-05-04 ENCOUNTER — Ambulatory Visit (INDEPENDENT_AMBULATORY_CARE_PROVIDER_SITE_OTHER): Payer: 59 | Admitting: Nurse Practitioner

## 2022-05-04 VITALS — BP 122/77 | HR 72 | Temp 97.9°F | Ht 67.01 in | Wt 212.4 lb

## 2022-05-04 DIAGNOSIS — I75023 Atheroembolism of bilateral lower extremities: Secondary | ICD-10-CM | POA: Diagnosis not present

## 2022-05-04 DIAGNOSIS — E6609 Other obesity due to excess calories: Secondary | ICD-10-CM

## 2022-05-04 DIAGNOSIS — J9611 Chronic respiratory failure with hypoxia: Secondary | ICD-10-CM

## 2022-05-04 DIAGNOSIS — K219 Gastro-esophageal reflux disease without esophagitis: Secondary | ICD-10-CM

## 2022-05-04 DIAGNOSIS — E1169 Type 2 diabetes mellitus with other specified complication: Secondary | ICD-10-CM

## 2022-05-04 DIAGNOSIS — E538 Deficiency of other specified B group vitamins: Secondary | ICD-10-CM

## 2022-05-04 DIAGNOSIS — J432 Centrilobular emphysema: Secondary | ICD-10-CM

## 2022-05-04 DIAGNOSIS — E1159 Type 2 diabetes mellitus with other circulatory complications: Secondary | ICD-10-CM

## 2022-05-04 DIAGNOSIS — F17219 Nicotine dependence, cigarettes, with unspecified nicotine-induced disorders: Secondary | ICD-10-CM

## 2022-05-04 DIAGNOSIS — I739 Peripheral vascular disease, unspecified: Secondary | ICD-10-CM

## 2022-05-04 DIAGNOSIS — I152 Hypertension secondary to endocrine disorders: Secondary | ICD-10-CM | POA: Diagnosis not present

## 2022-05-04 DIAGNOSIS — I5032 Chronic diastolic (congestive) heart failure: Secondary | ICD-10-CM

## 2022-05-04 DIAGNOSIS — F411 Generalized anxiety disorder: Secondary | ICD-10-CM

## 2022-05-04 DIAGNOSIS — K760 Fatty (change of) liver, not elsewhere classified: Secondary | ICD-10-CM

## 2022-05-04 DIAGNOSIS — J449 Chronic obstructive pulmonary disease, unspecified: Secondary | ICD-10-CM | POA: Diagnosis not present

## 2022-05-04 DIAGNOSIS — E559 Vitamin D deficiency, unspecified: Secondary | ICD-10-CM

## 2022-05-04 DIAGNOSIS — D692 Other nonthrombocytopenic purpura: Secondary | ICD-10-CM | POA: Diagnosis not present

## 2022-05-04 DIAGNOSIS — E1151 Type 2 diabetes mellitus with diabetic peripheral angiopathy without gangrene: Secondary | ICD-10-CM

## 2022-05-04 DIAGNOSIS — E66811 Obesity, class 1: Secondary | ICD-10-CM

## 2022-05-04 DIAGNOSIS — I700 Atherosclerosis of aorta: Secondary | ICD-10-CM

## 2022-05-04 DIAGNOSIS — Z6833 Body mass index (BMI) 33.0-33.9, adult: Secondary | ICD-10-CM

## 2022-05-04 DIAGNOSIS — J9612 Chronic respiratory failure with hypercapnia: Secondary | ICD-10-CM

## 2022-05-04 DIAGNOSIS — E785 Hyperlipidemia, unspecified: Secondary | ICD-10-CM | POA: Diagnosis not present

## 2022-05-04 DIAGNOSIS — Z981 Arthrodesis status: Secondary | ICD-10-CM

## 2022-05-04 LAB — MICROALBUMIN, URINE WAIVED
Creatinine, Urine Waived: 200 mg/dL (ref 10–300)
Microalb, Ur Waived: 80 mg/L — ABNORMAL HIGH (ref 0–19)
Microalb/Creat Ratio: <30 mg/g (ref <30)

## 2022-05-04 LAB — BAYER DCA HB A1C WAIVED: HB A1C (BAYER DCA - WAIVED): 6.4 % — ABNORMAL HIGH (ref 4.8–5.6)

## 2022-05-04 MED ORDER — LIDOCAINE 5 % EX PTCH: 1.0000 | MEDICATED_PATCH | CUTANEOUS | 0 refills | Status: AC

## 2022-05-04 MED ORDER — VITAMIN B-12 1000 MCG PO TABS: 1000.0000 ug | ORAL_TABLET | Freq: Every day | ORAL | 4 refills | Status: DC

## 2022-05-04 MED ORDER — HYDROXYZINE PAMOATE 25 MG PO CAPS: 25.0000 mg | ORAL_CAPSULE | Freq: Three times a day (TID) | ORAL | 4 refills | Status: DC | PRN

## 2022-05-04 MED ORDER — VERAPAMIL HCL ER 120 MG PO TBCR: 120.0000 mg | EXTENDED_RELEASE_TABLET | Freq: Every day | ORAL | 4 refills | Status: DC

## 2022-05-04 MED ORDER — ROSUVASTATIN CALCIUM 40 MG PO TABS: 40.0000 mg | ORAL_TABLET | Freq: Every day | ORAL | 4 refills | Status: DC

## 2022-05-04 NOTE — Progress Notes (Signed)
BP 122/77   Pulse 72   Temp 97.9 F (36.6 C) (Oral)   Ht 5' 7.01" (1.702 m)   Wt 212 lb 6.4 oz (96.3 kg)   SpO2 90%   BMI 33.26 kg/m    Subjective:    Patient ID: Stacey Gross, female    DOB: 10-26-1960, 62 y.o.   MRN: [id number]  HPI: Stacey Gross is a 62 y.o. female  Chief Complaint  Patient presents with   Diabetes   Hypertension   Hyperlipidemia   COPD   Gastroesophageal Reflux   Vit D   Mood    Refused screenings   DIABETES A1c in October 8.2% due to dietary indiscretions.  Continues on Metformin 500 MG BID and Januvia 100 MG daily.  Metformin does cause increase in bowel movements -- tried to increase dosing and caused worsening issues.  In past GLP1 medications caused major stomach issues and Farxiga caused yeast infections. Hypoglycemic episodes:no Polydipsia/polyuria: no Visual disturbance: no Chest pain: no Paresthesias: no Glucose Monitoring: yes             Accucheck frequency: not checking             Fasting glucose:              Post prandial:             Evening:             Before meals: Taking Insulin?: no             Long acting insulin:             Short acting insulin: Blood Pressure Monitoring: not checking Retinal Examination: Up To Date Foot Exam: Up to Date Pneumovax: Up to Date Influenza: Not Up to Date -- refuses these Aspirin: no    HYPERTENSION / HYPERLIPIDEMIA/CHF Taking Olmesartan 5 MG, Verapamil 120 MG daily, Lasix 20 MG PRN, and Crestor 40 MG daily.  Follows with cardiology and last visit 11/24/21 with no changes.  EF on echo June 2022 was 60-65%.   Followed by vascular for her blue toe syndrome and last saw February 2021, to follow annually and she has not returned. Satisfied with current treatment? yes Duration of hypertension: chronic BP monitoring frequency: not checking BP range:  BP medication side effects: no Duration of hyperlipidemia: chronic Cholesterol medication side effects: no Cholesterol supplements:  none Medication compliance: good compliance Aspirin: no Recent stressors: no Recurrent headaches: no Visual changes: no Palpitations: occasional Dyspnea: some worsening Chest pain: none Lower extremity edema: occasional Dizzy/lightheaded: no   GERD Continues Protonix daily. GERD control status: stable Satisfied with current treatment? yes Heartburn frequency: occasional Medication side effects: no  Medication compliance: stable Dysphagia: no Odynophagia:  no Hematemesis: no Blood in stool: no EGD: yes   COPD Continues to smoke < 1 PPD, not interested in quitting -- has been smoking since age 3.  Lung CT screening 01/21/22 == ongoing emphysema and aortic atherosclerosis noted, to continue annually.  Last pulmonary visit 01/06/22-- they recommended to continue Breztri. Plus Albuterol as needed.  Uses oxygen at night, does not use consistently -- does not have humidifier attached and reports it burns her nose. COPD status: stable Satisfied with current treatment?: yes Oxygen use: no Dyspnea frequency: occasional at baseline Cough frequency: at baseline Rescue inhaler frequency:  every day in morning Limitation of activity: no Productive cough: no Last Spirometry: with pulmonary Pneumovax: Up to Date Influenza: Refuses  ANXIETY/STRESS Currently only taking  Celexa daily, this is offering benefit.  Has chronic back pain and does not like leaving house -- can not take Tylenol.  Has used creams, but these do not help. Duration:stable Anxious mood: yes  Excessive worrying: no Irritability: no Sweating: no Nausea: no Palpitations:no Hyperventilation: no Panic attacks: no Agoraphobia: no  Obscessions/compulsions: no Depressed mood: no    10-May-2022    8:53 AM 01/31/2022    2:44 PM 05/17/2021   11:44 AM 03/31/2021   11:56 AM 05/15/2020   11:17 AM  Depression screen PHQ 2/9  Decreased Interest 0 0 0 0 0  Down, Depressed, Hopeless 0 0 0 0 0  PHQ - 2 Score 0 0 0 0 0   Altered sleeping 0 0 0 0   Tired, decreased energy 0 0 0 0   Change in appetite 0 0 0 0   Feeling bad or failure about yourself  0 0 0 1   Trouble concentrating 0 0 0 0   Moving slowly or fidgety/restless 0 0 0 0   Suicidal thoughts 0 0 0 0   PHQ-9 Score 0 0 0 1   Difficult doing work/chores Not difficult at all Not difficult at all Not difficult at all Not difficult at all   Anhedonia: no Weight changes: no Insomnia: yes hard to fall asleep -- good night and bad nights Hypersomnia: no Fatigue/loss of energy: no Feelings of worthlessness: no Feelings of guilt: no Impaired concentration/indecisiveness: no Suicidal ideations: no  Crying spells: no Recent Stressors/Life Changes: no   Relationship problems: no   Family stress: no     Financial stress: no    Job stress: no    Recent death/loss: no    05-10-2022    8:54 AM 01/31/2022    2:45 PM 03/31/2021   11:56 AM 05/13/2020    9:47 AM  GAD 7 : Generalized Anxiety Score  Nervous, Anxious, on Edge 0 0 0 3  Control/stop worrying 0 0 1 3  Worry too much - different things 0 0 1 3  Trouble relaxing 0 0 0 3  Restless 0 0 0 2  Easily annoyed or irritable 0 0 2 3  Afraid - awful might happen 0 0 0 1  Total GAD 7 Score 0 0 4 18  Anxiety Difficulty Not difficult at all Not difficult at all Not difficult at all    Relevant past medical, surgical, family and social history reviewed and updated as indicated. Interim medical history since our last visit reviewed. Allergies and medications reviewed and updated.  Review of Systems  Constitutional:  Negative for activity change, appetite change, diaphoresis, fatigue and fever.  Respiratory:  Positive for cough (baseline). Negative for chest tightness, shortness of breath and wheezing.   Cardiovascular:  Positive for leg swelling (occasional). Negative for chest pain and palpitations.  Gastrointestinal: Negative.   Endocrine: Negative for polydipsia, polyphagia and polyuria.   Neurological: Negative.   Psychiatric/Behavioral: Negative.  Negative for sleep disturbance and suicidal ideas.     Per HPI unless specifically indicated above     Objective:    BP 122/77   Pulse 72   Temp 97.9 F (36.6 C) (Oral)   Ht 5' 7.01" (1.702 m)   Wt 212 lb 6.4 oz (96.3 kg)   SpO2 90%   BMI 33.26 kg/m   Wt Readings from Last 3 Encounters:  05/10/22 212 lb 6.4 oz (96.3 kg)  02/23/22 212 lb (96.2 kg)  01/31/22 218 lb (98.9 kg)  Physical Exam Vitals and nursing note reviewed.  Constitutional:      General: She is awake.     Appearance: She is well-developed and well-groomed. She is obese.  HENT:     Head: Normocephalic.     Right Ear: Hearing, tympanic membrane, ear canal and external ear normal.     Left Ear: Hearing, tympanic membrane, ear canal and external ear normal.     Nose:     Right Sinus: No maxillary sinus tenderness or frontal sinus tenderness.     Left Sinus: No maxillary sinus tenderness or frontal sinus tenderness.  Eyes:     General: Lids are normal.        Right eye: No discharge.        Left eye: No discharge.     Conjunctiva/sclera: Conjunctivae normal.     Pupils: Pupils are equal, round, and reactive to light.  Neck:     Thyroid: No thyromegaly.     Vascular: No carotid bruit.  Cardiovascular:     Rate and Rhythm: Normal rate and regular rhythm.     Heart sounds: Normal heart sounds. No murmur heard.    No gallop.  Pulmonary:     Effort: Pulmonary effort is normal. No accessory muscle usage or respiratory distress.     Breath sounds: Wheezing present.     Comments: Scattered expiratory wheezes throughout noted - baseline. Abdominal:     General: Bowel sounds are normal.     Palpations: Abdomen is soft.  Musculoskeletal:     Cervical back: Normal range of motion and neck supple.     Right lower leg: No edema.     Left lower leg: No edema.  Skin:    General: Skin is warm and dry.     Comments: Scattered pale purple bruising  bilateral upper extremities.  Neurological:     Mental Status: She is alert and oriented to person, place, and time.  Psychiatric:        Attention and Perception: Attention normal.        Mood and Affect: Mood normal.        Speech: Speech normal.        Behavior: Behavior normal. Behavior is cooperative.        Thought Content: Thought content normal.    Results for orders placed or performed in visit on 02/23/22  Microscopic Examination   Urine  Result Value Ref Range   WBC, UA 0-5 0 - 5 /hpf   RBC, Urine None seen 0 - 2 /hpf   Epithelial Cells (non renal) 0-10 0 - 10 /hpf   Casts Present (A) None seen /lpf   Cast Type Hyaline casts N/A   Bacteria, UA None seen None seen/Few  Urinalysis, Routine w reflex microscopic  Result Value Ref Range   Specific Gravity, UA 1.020 1.005 - 1.030   pH, UA 6.0 5.0 - 7.5   Color, UA Yellow Yellow   Appearance Ur Clear Clear   Leukocytes,UA Negative Negative   Protein,UA 1+ (A) Negative/Trace   Glucose, UA Negative Negative   Ketones, UA Negative Negative   RBC, UA Negative Negative   Bilirubin, UA Negative Negative   Urobilinogen, Ur 1.0 0.2 - 1.0 mg/dL   Nitrite, UA Negative Negative   Microscopic Examination See below:       Assessment & Plan:   Problem List Items Addressed This Visit       Cardiovascular and Mediastinum   Aortic atherosclerosis ([place])  Chronic, ongoing noted on lung CT screening.  Continue statin and ASA daily for prevention.  Continue collaboration with vascular.  Recommend complete cessation smoking.      Relevant Medications   rosuvastatin (CRESTOR) 40 MG tablet   verapamil (CALAN-SR) 120 MG CR tablet   Other Relevant Orders   Comprehensive metabolic panel   Lipid Panel w/o Chol/HDL Ratio   Blue toe syndrome (HCC)    Chronic, ongoing.  Continue collaboration with vascular.  Recommend she return for visit with them, new referral placed.      Relevant Medications   rosuvastatin (CRESTOR) 40 MG tablet    verapamil (CALAN-SR) 120 MG CR tablet   Other Relevant Orders   Comprehensive metabolic panel   Lipid Panel w/o Chol/HDL Ratio   Ambulatory referral to Vascular Surgery   Chronic diastolic heart failure [facility])    Per cardiology notes on review.  She is to follow-up with them in 3 months, recommend she call and schedule with them.  At this time continue current medication regimen and recommend: - Reminded to call for an overnight weight gain of >2 pounds or a weekly weight gain of >5 pounds - not adding salt to food and read food labels. Reviewed the importance of keeping daily sodium intake to '2000mg'$  daily. - Avoid NSAIDs      Relevant Medications   rosuvastatin (CRESTOR) 40 MG tablet   verapamil (CALAN-SR) 120 MG CR tablet   Other Relevant Orders   Comprehensive metabolic panel   Lipid Panel w/o Chol/HDL Ratio   Hypertension associated with diabetes (HCC)    Chronic, stable.  BP well at goal in office today.  Recommend she monitor BP at least a few mornings a week at home and document.  DASH diet at home.  Continue current medication regimen and adjust as needed.  Labs today: CBC, TSH, CMP.  Return in 3 months.      Relevant Medications   rosuvastatin (CRESTOR) 40 MG tablet   verapamil (CALAN-SR) 120 MG CR tablet   Other Relevant Orders   Bayer DCA Hb A1c Waived   Microalbumin, Urine Waived   CBC with Differential/Platelet   Comprehensive metabolic panel   TSH   PVD (peripheral vascular disease) (HCC)    Chronic, stable, followed by vascular.  New referral placed to return to see them. Continue statin and ASA daily.      Relevant Medications   rosuvastatin (CRESTOR) 40 MG tablet   verapamil (CALAN-SR) 120 MG CR tablet   Other Relevant Orders   Comprehensive metabolic panel   Lipid Panel w/o Chol/HDL Ratio   Ambulatory referral to Vascular Surgery   Senile purpura (HCC)    Bilateral upper extremity.  Recommend gentle skin cleansing daily and application of lotion daily.   Monitor skin and notify provider if any abrasions or wounds.      Relevant Medications   rosuvastatin (CRESTOR) 40 MG tablet   verapamil (CALAN-SR) 120 MG CR tablet   Other Relevant Orders   CBC with Differential/Platelet   Type 2 diabetes, controlled, with peripheral circulatory disorder ([place]) - Primary    Chronic, ongoing with A1c trending down to 6.4% today, previous was 8.2%.  Urine ALB 80 January 2024.  Recommend she continue Olmesartan daily for kidney protection. Continue Metformin 500 MG BID and continue Januvia 100 MG daily, educated her on this.  She did not tolerate GLP1 or SGLT2 in past or increase in Metformin.  Check BS BID.  Monitor diet at home and reduce  carbs and foods high in sugar.  Continue to collaborate with vascular. She prefers not to start insulin. Return in 3 months.      Relevant Medications   rosuvastatin (CRESTOR) 40 MG tablet   verapamil (CALAN-SR) 120 MG CR tablet   Other Relevant Orders   Bayer DCA Hb A1c Waived   Microalbumin, Urine Waived     Respiratory   Centrilobular emphysema (HCC)    Chronic, ongoing.  Continue Breztri daily and Albuterol as needed.  Recommend complete cessation of smoking.  Continue annual CT scans.  Continue collaboration with pulmonary, which is beneficial.        Relevant Orders   CBC with Differential/Platelet   Chronic respiratory failure with hypoxia and hypercapnia ([place])    Continue to collaborate with pulmonary.  Tolerating O2 at night well.      Relevant Orders   CBC with Differential/Platelet     Digestive   GERD (gastroesophageal reflux disease)    Chronic, ongoing, recommend complete cessation of smoking, which would benefit GERD symptoms.  Continue PPI at this time, has not tolerated reductions.  Mag level annually. Risks of PPI use were discussed with patient including bone loss, C. Diff diarrhea, pneumonia, infections, CKD, electrolyte abnormalities.  Verbalizes understanding and chooses to continue the  medication.       Hepatic steatosis    Noted on past CT imaging, monitor and focus on healthy diet at home.  CMP today.        Endocrine   Hyperlipidemia associated with type 2 diabetes mellitus (HCC)    Chronic, ongoing.  Highly recommend she continue Crestor and adjust dosing as needed or add on Zetia if poor control.  Lipid check today.      Relevant Medications   rosuvastatin (CRESTOR) 40 MG tablet   verapamil (CALAN-SR) 120 MG CR tablet   Other Relevant Orders   Bayer DCA Hb A1c Waived   Comprehensive metabolic panel   Lipid Panel w/o Chol/HDL Ratio     Nervous and Auditory   Nicotine dependence, cigarettes, w unsp disorders    I have recommended complete cessation of tobacco use. I have discussed various options available for assistance with tobacco cessation including over the counter methods (Nicotine gum, patch and lozenges). We also discussed prescription options (Chantix, Nicotine Inhaler / Nasal Spray). The patient is not interested in pursuing any prescription tobacco cessation options at this time.  Continue yearly lung screening.         Other   B12 deficiency    Chronic, continue supplement and recheck level today.  Is on long term Metformin.      Relevant Orders   Vitamin B12   Generalized anxiety disorder    Chronic, stable.  Recommend continue Celexa daily to overall help mood, she agrees with this plan.  Overall mood stable.  Denies SI/HI.      Relevant Medications   hydrOXYzine (VISTARIL) 25 MG capsule   Obesity    BMI 33.26.  Recommended eating smaller high protein, low fat meals more frequently and exercising 30 mins a day 5 times a week with a goal of 10-15lb weight loss in the next 3 months. Patient voiced their understanding and motivation to adhere to these recommendations.       S/P lumbar fusion    Lidocaine patches sent for chronic lower back pain.      Vitamin D deficiency    Noted past labs, continue supplement and recheck level  today.  Relevant Orders   VITAMIN D 25 Hydroxy (Vit-D Deficiency, Fractures)      Follow up plan: Return in about 3 months (around 08/02/2022) for T2DM, HTN/HLD, COPD, MOOD, PVD.

## 2022-05-04 NOTE — Assessment & Plan Note (Signed)
BMI 33.26.  Recommended eating smaller high protein, low fat meals more frequently and exercising 30 mins a day 5 times a week with a goal of 10-15lb weight loss in the next 3 months. Patient voiced their understanding and motivation to adhere to these recommendations.

## 2022-05-04 NOTE — Assessment & Plan Note (Addendum)
Chronic, stable.  Recommend continue Celexa daily to overall help mood, she agrees with this plan.  Overall mood stable.  Denies SI/HI.

## 2022-05-04 NOTE — Assessment & Plan Note (Signed)
Chronic, ongoing.  Highly recommend she continue Crestor and adjust dosing as needed or add on Zetia if poor control.  Lipid check today.

## 2022-05-04 NOTE — Assessment & Plan Note (Signed)
Chronic, ongoing.  Continue collaboration with vascular.  Recommend she return for visit with them, new referral placed.

## 2022-05-04 NOTE — Assessment & Plan Note (Signed)
Noted on past CT imaging, monitor and focus on healthy diet at home.  CMP today.

## 2022-05-04 NOTE — Assessment & Plan Note (Signed)
Lidocaine patches sent for chronic lower back pain.

## 2022-05-04 NOTE — Assessment & Plan Note (Signed)
Continue to collaborate with pulmonary.  Tolerating O2 at night well.

## 2022-05-04 NOTE — Assessment & Plan Note (Signed)
Chronic, ongoing, recommend complete cessation of smoking, which would benefit GERD symptoms.  Continue PPI at this time, has not tolerated reductions.  Mag level annually. Risks of PPI use were discussed with patient including bone loss, C. Diff diarrhea, pneumonia, infections, CKD, electrolyte abnormalities.  Verbalizes understanding and chooses to continue the medication.

## 2022-05-04 NOTE — Patient Instructions (Signed)
Diabetes Mellitus Basics  Diabetes mellitus, or diabetes, is a long-term (chronic) disease. It occurs when the body does not properly use sugar (glucose) that is released from food after you eat. Diabetes mellitus may be caused by one or both of these problems: Your pancreas does not make enough of a hormone called insulin. Your body does not react in a normal way to the insulin that it makes. Insulin lets glucose enter cells in your body. This gives you energy. If you have diabetes, glucose cannot get into cells. This causes high blood glucose (hyperglycemia). How to treat and manage diabetes You may need to take insulin or other diabetes medicines daily to keep your glucose in balance. If you are prescribed insulin, you will learn how to give yourself insulin by injection. You may need to adjust the amount of insulin you take based on the foods that you eat. You will need to check your blood glucose levels using a glucose monitor as told by your health care provider. The readings can help determine if you have low or high blood glucose. Generally, you should have these blood glucose levels: Before meals (preprandial): 80-130 mg/dL (4.4-7.2 mmol/L). After meals (postprandial): below 180 mg/dL (10 mmol/L). Hemoglobin A1c (HbA1c) level: less than 7%. Your health care provider will set treatment goals for you. Keep all follow-up visits. This is important. Follow these instructions at home: Diabetes medicines Take your diabetes medicines every day as told by your health care provider. List your diabetes medicines here: Name of medicine: ______________________________ Amount (dose): _______________ Time (a.m./p.m.): _______________ Notes: ___________________________________ Name of medicine: ______________________________ Amount (dose): _______________ Time (a.m./p.m.): _______________ Notes: ___________________________________ Name of medicine: ______________________________ Amount (dose):  _______________ Time (a.m./p.m.): _______________ Notes: ___________________________________ Insulin If you use insulin, list the types of insulin you use here: Insulin type: ______________________________ Amount (dose): _______________ Time (a.m./p.m.): _______________Notes: ___________________________________ Insulin type: ______________________________ Amount (dose): _______________ Time (a.m./p.m.): _______________ Notes: ___________________________________ Insulin type: ______________________________ Amount (dose): _______________ Time (a.m./p.m.): _______________ Notes: ___________________________________ Insulin type: ______________________________ Amount (dose): _______________ Time (a.m./p.m.): _______________ Notes: ___________________________________ Insulin type: ______________________________ Amount (dose): _______________ Time (a.m./p.m.): _______________ Notes: ___________________________________ Managing blood glucose  Check your blood glucose levels using a glucose monitor as told by your health care provider. Write down the times that you check your glucose levels here: Time: _______________ Notes: ___________________________________ Time: _______________ Notes: ___________________________________ Time: _______________ Notes: ___________________________________ Time: _______________ Notes: ___________________________________ Time: _______________ Notes: ___________________________________ Time: _______________ Notes: ___________________________________  Low blood glucose Low blood glucose (hypoglycemia) is when glucose is at or below 70 mg/dL (3.9 mmol/L). Symptoms may include: Feeling: Hungry. Sweaty and clammy. Irritable or easily upset. Dizzy. Sleepy. Having: A fast heartbeat. A headache. A change in your vision. Numbness around the mouth, lips, or tongue. Having trouble with: Moving (coordination). Sleeping. Treating low blood glucose To treat low blood  glucose, eat or drink something containing sugar right away. If you can think clearly and swallow safely, follow the 15:15 rule: Take 15 grams of a fast-acting carb (carbohydrate), as told by your health care provider. Some fast-acting carbs are: Glucose tablets: take 3-4 tablets. Hard candy: eat 3-5 pieces. Fruit juice: drink 4 oz (120 mL). Regular (not diet) soda: drink 4-6 oz (120-180 mL). Honey or sugar: eat 1 Tbsp (15 mL). Check your blood glucose levels 15 minutes after you take the carb. If your glucose is still at or below 70 mg/dL (3.9 mmol/L), take 15 grams of a carb again. If your glucose does not go above 70 mg/dL (3.9 mmol/L) after   3 tries, get help right away. After your glucose goes back to normal, eat a meal or a snack within 1 hour. Treating very low blood glucose If your glucose is at or below 54 mg/dL (3 mmol/L), you have very low blood glucose (severe hypoglycemia). This is an emergency. Do not wait to see if the symptoms will go away. Get medical help right away. Call your local emergency services (911 in the [place] Do not drive yourself to the hospital. Questions to ask your health care provider Should I talk with a diabetes educator? What equipment will I need to care for myself at home? What diabetes medicines do I need? When should I take them? How often do I need to check my blood glucose levels? What number can I call if I have questions? When is my follow-up visit? Where can I find a support group for people with diabetes? Where to find more information American Diabetes Association: www.diabetes.org Association of Diabetes Care and Education Specialists: www.diabeteseducator.org Contact a health care provider if: Your blood glucose is at or above 240 mg/dL (13.3 mmol/L) for 2 days in a row. You have been sick or have had a fever for 2 days or more, and you are not getting better. You have any of these problems for more than 6 hours: You cannot eat or  drink. You feel nauseous. You vomit. You have diarrhea. Get help right away if: Your blood glucose is lower than 54 mg/dL (3 mmol/L). You get confused. You have trouble thinking clearly. You have trouble breathing. These symptoms may represent a serious problem that is an emergency. Do not wait to see if the symptoms will go away. Get medical help right away. Call your local emergency services (911 in the [place] Do not drive yourself to the hospital. Summary Diabetes mellitus is a chronic disease that occurs when the body does not properly use sugar (glucose) that is released from food after you eat. Take insulin and diabetes medicines as told. Check your blood glucose every day, as often as told. Keep all follow-up visits. This is important. This information is not intended to replace advice given to you by your health care provider. Make sure you discuss any questions you have with your health care provider. Document Revised: 07/23/2019 Document Reviewed: 07/23/2019 Elsevier Patient Education  2023 Elsevier Inc.  

## 2022-05-04 NOTE — Assessment & Plan Note (Signed)
I have recommended complete cessation of tobacco use. I have discussed various options available for assistance with tobacco cessation including over the counter methods (Nicotine gum, patch and lozenges). We also discussed prescription options (Chantix, Nicotine Inhaler / Nasal Spray). The patient is not interested in pursuing any prescription tobacco cessation options at this time.  Continue yearly lung screening.

## 2022-05-04 NOTE — Assessment & Plan Note (Signed)
Chronic, stable, followed by vascular.  New referral placed to return to see them. Continue statin and ASA daily.

## 2022-05-04 NOTE — Assessment & Plan Note (Signed)
Noted past labs, continue supplement and recheck level today.

## 2022-05-04 NOTE — Assessment & Plan Note (Signed)
Chronic, ongoing noted on lung CT screening.  Continue statin and ASA daily for prevention.  Continue collaboration with vascular.  Recommend complete cessation smoking.

## 2022-05-04 NOTE — Assessment & Plan Note (Signed)
Chronic, ongoing.  Continue Breztri daily and Albuterol as needed.  Recommend complete cessation of smoking.  Continue annual CT scans.  Continue collaboration with pulmonary, which is beneficial.

## 2022-05-04 NOTE — Assessment & Plan Note (Signed)
Chronic, continue supplement and recheck level today.  Is on long term Metformin.

## 2022-05-04 NOTE — Assessment & Plan Note (Signed)
Chronic, ongoing with A1c trending down to 6.4% today, previous was 8.2%.  Urine ALB 80 January 2024.  Recommend she continue Olmesartan daily for kidney protection. Continue Metformin 500 MG BID and continue Januvia 100 MG daily, educated her on this.  She did not tolerate GLP1 or SGLT2 in past or increase in Metformin.  Check BS BID.  Monitor diet at home and reduce carbs and foods high in sugar.  Continue to collaborate with vascular. She prefers not to start insulin. Return in 3 months.

## 2022-05-04 NOTE — Assessment & Plan Note (Signed)
Chronic, stable.  BP well at goal in office today.  Recommend she monitor BP at least a few mornings a week at home and document.  DASH diet at home.  Continue current medication regimen and adjust as needed.  Labs today: CBC, TSH, CMP.  Return in 3 months.

## 2022-05-04 NOTE — Assessment & Plan Note (Signed)
Per cardiology notes on review.  She is to follow-up with them in 3 months, recommend she call and schedule with them.  At this time continue current medication regimen and recommend: - Reminded to call for an overnight weight gain of >2 pounds or a weekly weight gain of >5 pounds - not adding salt to food and read food labels. Reviewed the importance of keeping daily sodium intake to '2000mg'$  daily. - Avoid NSAIDs

## 2022-05-04 NOTE — Assessment & Plan Note (Signed)
Bilateral upper extremity.  Recommend gentle skin cleansing daily and application of lotion daily.  Monitor skin and notify provider if any abrasions or wounds.

## 2022-05-05 ENCOUNTER — Other Ambulatory Visit: Payer: Self-pay

## 2022-05-05 NOTE — Progress Notes (Signed)
Contacted via [facility], your labs have returned: - Kidney function, creatinine and eGFR, is normal, as is liver function, AST and ALT.  - Cholesterol labs are stable, continue current Rosuvastatin dosing. - Vitamin D is a little on low side, ensure you are taking Vitamin D3 2000 units daily for bone health. - Remainder of labs stable.  Happy early [age]!!  Any questions? Keep being awesome!!  Thank you for allowing me to participate in your care.  I appreciate you. Kindest regards, Ellah Otte

## 2022-05-05 NOTE — Telephone Encounter (Signed)
PA started for Lidocaine patches through [employer]. Awaiting on determination  

## 2022-05-06 ENCOUNTER — Telehealth: Payer: Self-pay

## 2022-05-06 LAB — CBC WITH DIFFERENTIAL/PLATELET
Basophils Absolute: 0.1 10*3/uL (ref 0.0–0.2)
Basos: 1 %
EOS (ABSOLUTE): 0.1 10*3/uL (ref 0.0–0.4)
Eos: 1 %
Hematocrit: 41.8 % (ref 34.0–46.6)
Hemoglobin: 13.6 g/dL (ref 11.1–15.9)
Immature Grans (Abs): 0 10*3/uL (ref 0.0–0.1)
Immature Granulocytes: 0 %
Lymphocytes Absolute: 2.4 10*3/uL (ref 0.7–3.1)
Lymphs: 28 %
MCH: 29.6 pg (ref 26.6–33.0)
MCHC: 32.5 g/dL (ref 31.5–35.7)
MCV: 91 fL (ref 79–97)
Monocytes Absolute: 0.4 10*3/uL (ref 0.1–0.9)
Monocytes: 5 %
Neutrophils Absolute: 5.7 10*3/uL (ref 1.4–7.0)
Neutrophils: 65 %
Platelets: 267 10*3/uL (ref 150–450)
RBC: 4.59 x10E6/uL (ref 3.77–5.28)
RDW: 12.2 % (ref 11.7–15.4)
WBC: 8.8 10*3/uL (ref 3.4–10.8)

## 2022-05-06 LAB — LIPID PANEL W/O CHOL/HDL RATIO
Cholesterol, Total: 114 mg/dL (ref 100–199)
HDL: 47 mg/dL (ref >39)
LDL Chol Calc (NIH): 41 mg/dL (ref 0–99)
Triglycerides: 158 mg/dL — ABNORMAL HIGH (ref 0–149)
VLDL Cholesterol Cal: 26 mg/dL (ref 5–40)

## 2022-05-06 LAB — COMPREHENSIVE METABOLIC PANEL
ALT: 14 IU/L (ref 0–32)
AST: 20 IU/L (ref 0–40)
Albumin/Globulin Ratio: 2.5 — ABNORMAL HIGH (ref 1.2–2.2)
Albumin: 4.5 g/dL (ref 3.9–4.9)
Alkaline Phosphatase: 79 IU/L (ref 44–121)
BUN/Creatinine Ratio: 10 — ABNORMAL LOW (ref 12–28)
BUN: 10 mg/dL (ref 8–27)
Bilirubin Total: 0.3 mg/dL (ref 0.0–1.2)
CO2: 26 mmol/L (ref 20–29)
Calcium: 9.7 mg/dL (ref 8.7–10.3)
Chloride: 98 mmol/L (ref 96–106)
Creatinine, Ser: 0.98 mg/dL (ref 0.57–1.00)
Globulin, Total: 1.8 g/dL (ref 1.5–4.5)
Glucose: 101 mg/dL — ABNORMAL HIGH (ref 70–99)
Potassium: 4.5 mmol/L (ref 3.5–5.2)
Sodium: 140 mmol/L (ref 134–144)
Total Protein: 6.3 g/dL (ref 6.0–8.5)
eGFR: 65 mL/min/{1.73_m2} (ref >59)

## 2022-05-06 LAB — TSH: TSH: 1.21 u[IU]/mL (ref 0.450–4.500)

## 2022-05-06 LAB — VITAMIN B12: Vitamin B-12: 490 pg/mL (ref 232–1245)

## 2022-05-06 LAB — VITAMIN D 25 HYDROXY (VIT D DEFICIENCY, FRACTURES): Vit D, 25-Hydroxy: 27.2 ng/mL — ABNORMAL LOW (ref 30.0–100.0)

## 2022-05-06 NOTE — Telephone Encounter (Signed)
PA for lidocaine 5% patches has been approved

## 2022-05-13 DIAGNOSIS — M170 Bilateral primary osteoarthritis of knee: Secondary | ICD-10-CM | POA: Diagnosis not present

## 2022-05-23 ENCOUNTER — Ambulatory Visit (INDEPENDENT_AMBULATORY_CARE_PROVIDER_SITE_OTHER): Payer: 59

## 2022-05-23 VITALS — Ht 67.0 in | Wt 212.0 lb

## 2022-05-23 DIAGNOSIS — Z0000 Encounter for general adult medical examination without abnormal findings: Secondary | ICD-10-CM | POA: Diagnosis not present

## 2022-05-23 NOTE — Patient Instructions (Signed)
Stacey Gross , Thank you for taking time to come for your Medicare Wellness Visit. I appreciate your ongoing commitment to your health goals. Please review the following plan we discussed and let me know if I can assist you in the future.   These are the goals we discussed:  Goals       DIET - EAT MORE FRUITS AND VEGETABLES      Patient Stated      05/15/2020, no goals      PharmD "I want to get my A1c down" (pt-stated)      Current Barriers:  Diabetes: uncontrolled but improved, most recent A1c 7.0% Current antihyperglycemic regimen: metformin 500 mg BID, Ozempic 0.5 mg once weekly Current blood glucose readings: Fasting <130, post prandial <180 generally Current meal patterns: Has cut back on sodas; previously was drinking ~3 per day, now at 1/2 per day  Current exercise:  Walking ~1 mile daily; previously was waking 2 miles a day  Was up in the [employer] recently and was able to walk much further than she previously could  Cardiovascular risk reduction: Current hypertensive regimen: lisinopril 10 mg daily, BP generally well controlled <130/80 at recent appointments Current hyperlipidemia regimen: atorvastatin 20 mg; LDL at goal <100  Pharmacist Clinical Goal(s):  Over the next 90 days, patient with work with PharmD and primary care provider to address optimized medication management  Interventions: Congratulated patient on maintenance of goal BG.  Reviewed goal A1c, fasting glucose, and 2 hour post prandial glucose. Provided this in writing  Patient Self Care Activities:  Patient will check blood glucose regularly , document, and provide at future appointments Patient will take medications as prescribed Patient will report any questions or concerns to provider   Please see past updates related to this goal by clicking on the "Past Updates" button in the selected goal        PharmD "My breathing is bad right now" (pt-stated)      Current Barriers:  Tobacco abuse since age  62; currently smoking 1/2 ppd Previous quit attempt for ~2 months with husband, but quickly relapsed. Has been on Chantix therapy, which helped her reduce how much she is smoking. However, increased irritability led to Chantix + fluoxetine being discontinued, and bupropion being started a few weeks ago. Reports lessened irritability Is interested in using nicotine replacement therapy   Pharmacist Clinical Goal(s):  Over the next 60 days, patient will work with PharmD and provider towards tobacco cessation  Interventions: Comprehensive medication review performed, medication list in electronic medical record updated Recommend NRT patches + gum/lozenges in combination with bupropion. Patient notes that she cannot afford them if not covered by insurance. Recommended that she contact the [place] (1-800-QUIT-NOW). Patient will outreach this group for support.  Patient Self Care Activities:  Patient will commit to reducing tobacco consumption  Initial goal documentation        Quit Smoking      Quit smoking / using tobacco      Smoking cessation discussed         This is a list of the screening recommended for you and due dates:  Health Maintenance  Topic Date Due   Flu Shot  07/03/2022*   Complete foot exam   06/30/2022   Mammogram  09/23/2022   Hemoglobin A1C  11/02/2022   Screening for Lung Cancer  01/22/2023   Eye exam for diabetics  01/28/2023   Yearly kidney function blood test for diabetes  05/05/2023  Yearly kidney health urinalysis for diabetes  05/05/2023   Medicare Annual Wellness Visit  05/24/2023   DTaP/Tdap/Td vaccine (3 - Tdap) 05/13/2024   Colon Cancer Screening  06/10/2024   Hepatitis C Screening: USPSTF Recommendation to screen - Ages 18-79 yo.  Completed   HIV Screening  Completed   Zoster (Shingles) Vaccine  Completed   HPV Vaccine  Aged Out   COVID-19 Vaccine  Discontinued  *Topic was postponed. The date shown is not the original due date.     Advanced directives: no  Conditions/risks identified: none  Next appointment: Follow up in one year for your annual wellness visit. 05/29/23 @ 10:15 am by phone  Preventive Care 40-64 Years, Female Preventive care refers to lifestyle choices and visits with your health care provider that can promote health and wellness. What does preventive care include? A yearly physical exam. This is also called an annual well check. Dental exams once or twice a year. Routine eye exams. Ask your health care provider how often you should have your eyes checked. Personal lifestyle choices, including: Daily care of your teeth and gums. Regular physical activity. Eating a healthy diet. Avoiding tobacco and drug use. Limiting alcohol use. Practicing safe sex. Taking low-dose aspirin daily starting at age 25. Taking vitamin and mineral supplements as recommended by your health care provider. What happens during an annual well check? The services and screenings done by your health care provider during your annual well check will depend on your age, overall health, lifestyle risk factors, and family history of disease. Counseling  Your health care provider may ask you questions about your: Alcohol use. Tobacco use. Drug use. Emotional well-being. Home and relationship well-being. Sexual activity. Eating habits. Work and work Statistician. Method of birth control. Menstrual cycle. Pregnancy history. Screening  You may have the following tests or measurements: Height, weight, and BMI. Blood pressure. Lipid and cholesterol levels. These may be checked every 5 years, or more frequently if you are over 14 years old. Skin check. Lung cancer screening. You may have this screening every year starting at age 61 if you have a 30-pack-year history of smoking and currently smoke or have quit within the past 15 years. Fecal occult blood test (FOBT) of the stool. You may have this test every year starting  at age 57. Flexible sigmoidoscopy or colonoscopy. You may have a sigmoidoscopy every 5 years or a colonoscopy every 10 years starting at age 77. Hepatitis C blood test. Hepatitis B blood test. Sexually transmitted disease (STD) testing. Diabetes screening. This is done by checking your blood sugar (glucose) after you have not eaten for a while (fasting). You may have this done every 1-3 years. Mammogram. This may be done every 1-2 years. Talk to your health care provider about when you should start having regular mammograms. This may depend on whether you have a family history of breast cancer. BRCA-related cancer screening. This may be done if you have a family history of breast, ovarian, tubal, or peritoneal cancers. Pelvic exam and Pap test. This may be done every 3 years starting at age 8. Starting at age 13, this may be done every 5 years if you have a Pap test in combination with an HPV test. Bone density scan. This is done to screen for osteoporosis. You may have this scan if you are at high risk for osteoporosis. Discuss your test results, treatment options, and if necessary, the need for more tests with your health care provider. Vaccines  Your  health care provider may recommend certain vaccines, such as: Influenza vaccine. This is recommended every year. Tetanus, diphtheria, and acellular pertussis (Tdap, Td) vaccine. You may need a Td booster every 10 years. Zoster vaccine. You may need this after age 78. Pneumococcal 13-valent conjugate (PCV13) vaccine. You may need this if you have certain conditions and were not previously vaccinated. Pneumococcal polysaccharide (PPSV23) vaccine. You may need one or two doses if you smoke cigarettes or if you have certain conditions. Talk to your health care provider about which screenings and vaccines you need and how often you need them. This information is not intended to replace advice given to you by your health care provider. Make sure you  discuss any questions you have with your health care provider. Document Released: 04/17/2015 Document Revised: 12/09/2015 Document Reviewed: 01/20/2015 Elsevier Interactive Patient Education  2017 [employer] Prevention in the Home Falls can cause injuries. They can happen to people of all ages. There are many things you can do to make your home safe and to help prevent falls. What can I do on the outside of my home? Regularly fix the edges of walkways and driveways and fix any cracks. Remove anything that might make you trip as you walk through a door, such as a raised step or threshold. Trim any bushes or trees on the path to your home. Use bright outdoor lighting. Clear any walking paths of anything that might make someone trip, such as rocks or tools. Regularly check to see if handrails are loose or broken. Make sure that both sides of any steps have handrails. Any raised decks and porches should have guardrails on the edges. Have any leaves, snow, or ice cleared regularly. Use sand or salt on walking paths during winter. Clean up any spills in your garage right away. This includes oil or grease spills. What can I do in the bathroom? Use night lights. Install grab bars by the toilet and in the tub and shower. Do not use towel bars as grab bars. Use non-skid mats or decals in the tub or shower. If you need to sit down in the shower, use a plastic, non-slip stool. Keep the floor dry. Clean up any water that spills on the floor as soon as it happens. Remove soap buildup in the tub or shower regularly. Attach bath mats securely with double-sided non-slip rug tape. Do not have throw rugs and other things on the floor that can make you trip. What can I do in the bedroom? Use night lights. Make sure that you have a light by your bed that is easy to reach. Do not use any sheets or blankets that are too big for your bed. They should not hang down onto the floor. Have a firm  chair that has side arms. You can use this for support while you get dressed. Do not have throw rugs and other things on the floor that can make you trip. What can I do in the kitchen? Clean up any spills right away. Avoid walking on wet floors. Keep items that you use a lot in easy-to-reach places. If you need to reach something above you, use a strong step stool that has a grab bar. Keep electrical cords out of the way. Do not use floor polish or wax that makes floors slippery. If you must use wax, use non-skid floor wax. Do not have throw rugs and other things on the floor that can make you trip. What can  I do with my stairs? Do not leave any items on the stairs. Make sure that there are handrails on both sides of the stairs and use them. Fix handrails that are broken or loose. Make sure that handrails are as long as the stairways. Check any carpeting to make sure that it is firmly attached to the stairs. Fix any carpet that is loose or worn. Avoid having throw rugs at the top or bottom of the stairs. If you do have throw rugs, attach them to the floor with carpet tape. Make sure that you have a light switch at the top of the stairs and the bottom of the stairs. If you do not have them, ask someone to add them for you. What else can I do to help prevent falls? Wear shoes that: Do not have high heels. Have rubber bottoms. Are comfortable and fit you well. Are closed at the toe. Do not wear sandals. If you use a stepladder: Make sure that it is fully opened. Do not climb a closed stepladder. Make sure that both sides of the stepladder are locked into place. Ask someone to hold it for you, if possible. Clearly mark and make sure that you can see: Any grab bars or handrails. First and last steps. Where the edge of each step is. Use tools that help you move around (mobility aids) if they are needed. These include: Canes. Walkers. Scooters. Crutches. Turn on the lights when you go  into a dark area. Replace any light bulbs as soon as they burn out. Set up your furniture so you have a clear path. Avoid moving your furniture around. If any of your floors are uneven, fix them. If there are any pets around you, be aware of where they are. Review your medicines with your doctor. Some medicines can make you feel dizzy. This can increase your chance of falling. Ask your doctor what other things that you can do to help prevent falls. This information is not intended to replace advice given to you by your health care provider. Make sure you discuss any questions you have with your health care provider. Document Released: 01/15/2009 Document Revised: 08/27/2015 Document Reviewed: 04/25/2014 Elsevier Interactive Patient Education  2017 [employer].

## 2022-05-23 NOTE — Progress Notes (Signed)
I connected with  Stacey Gross on 05/23/22 by a audio enabled telemedicine application and verified that I am speaking with the correct person using two identifiers.  Patient Location: Home  Provider Location: Home Office  I discussed the limitations of evaluation and management by telemedicine. The patient expressed understanding and agreed to proceed.  Subjective:   Stacey Gross is a 62 y.o. female who presents for Medicare Annual (Subsequent) preventive examination.  Review of Systems     Cardiac Risk Factors include: advanced age (>83mn, >>41women);diabetes mellitus;hypertension;smoking/ tobacco exposure;sedentary lifestyle;obesity (BMI >30kg/m2)     Objective:    Today's Vitals   05/23/22 1102  PainSc: 5    There is no height or weight on file to calculate BMI.     05/23/2022   11:09 AM 06/10/2021    8:27 AM 05/17/2021   11:38 AM 07/27/2020    5:00 AM 07/25/2020   10:19 AM 07/25/2020    6:12 AM 05/15/2020   11:17 AM  Advanced Directives  Does Patient Have a Medical Advance Directive? No No Yes Yes  Yes Yes  Type of [place]   [place]  Does patient want to make changes to medical advance directive?    No - Patient declined No - Patient declined Yes (ED - Information included in AVS)   Copy of HTelfordin Chart?   No - copy requested No - copy requested   No - copy requested  Would patient like information on creating a medical advance directive? No - Patient declined No - Patient declined         Current Medications (verified) Outpatient Encounter Medications as of 05/23/2022  Medication Sig   Accu-Chek Softclix Lancets lancets USE TO CHECK BLOOD SUGAR DAILY   albuterol (PROVENTIL) (2.5 MG/3ML) 0.083% nebulizer solution USE 1 VIAL (3 ML = 2.5 MG TOTAL) BY NEBULIZATION EVERY 6 HOURS AS NEEDED FORWHEEZING OR SHORTNESS OF BREATH    albuterol (VENTOLIN HFA) 108 (90 Base) MCG/ACT inhaler INHALE 2 PUFFS BY MOUTH INTO THE LUNGS EVERY 6 HOURS AS NEEDED FOR WHEEZING OR SHORTNESS OF BREATH   Blood Glucose Monitoring Suppl (ACCU-CHEK GUIDE ME) w/Device KIT Use to check blood sugars 2-3 times daily with goals = <130 fasting in morning and <180 two hours after eating.  Bring blood sugar log to appointments.   Budeson-Glycopyrrol-Formoterol (BREZTRI AEROSPHERE) 160-9-4.8 MCG/ACT AERO Inhale 2 puffs into the lungs in the morning and at bedtime.   Cholecalciferol 1.25 MG (50000 UT) TABS Take 1 tablet by mouth once a week.   citalopram (CELEXA) 20 MG tablet Take 1 tablet (20 mg total) by mouth daily.   cyanocobalamin (VITAMIN B12) 1000 MCG tablet Take 1 tablet (1,000 mcg total) by mouth daily.   furosemide (LASIX) 20 MG tablet TAKE 1 TABLET EVERY DAY AS NEEDED   glucose blood test strip Use to check blood sugar three times a day.   hydrOXYzine (VISTARIL) 25 MG capsule Take 1 capsule (25 mg total) by mouth every 8 (eight) hours as needed.   lidocaine (LIDODERM) 5 % Place 1 patch onto the skin daily. Remove & Discard patch within 12 hours or as directed by MD   metFORMIN (GLUCOPHAGE) 500 MG tablet Take 500 mg by mouth 2 (two) times daily.   olmesartan (BENICAR) 5 MG tablet Take 1 tablet (5 mg total) by mouth daily.   pantoprazole (PROTONIX) 40 MG  tablet Take 1 tablet (40 mg total) by mouth daily.   prochlorperazine (COMPAZINE) 10 MG tablet Take 10 mg by mouth every 6 (six) hours as needed.   sitaGLIPtin (JANUVIA) 100 MG tablet Take 1 tablet (100 mg total) by mouth daily.   traZODone (DESYREL) 100 MG tablet TAKE 1 TABLET BY MOUTH AT BEDTIME AS NEEDED FOR SLEEP   verapamil (CALAN-SR) 120 MG CR tablet Take 1 tablet (120 mg total) by mouth at bedtime.   lactobacillus acidophilus & bulgar (LACTINEX) chewable tablet Chew 1 tablet by mouth 3 (three) times daily as needed (take after every stools). (Patient not taking: Reported on 05/23/2022)    nystatin (MYCOSTATIN) 100000 UNIT/ML suspension Take 5 mLs (500,000 Units total) by mouth 4 (four) times daily. (Patient not taking: Reported on 05/23/2022)   rosuvastatin (CRESTOR) 40 MG tablet Take 1 tablet (40 mg total) by mouth daily.   No facility-administered encounter medications on file as of 05/23/2022.    Allergies (verified) Doxycycline hyclate, Acetaminophen, Aspirin, Latex, and Vancomycin   History: Past Medical History:  Diagnosis Date   Asthma    Cancer ([place])    stomach   Chronic back pain    COPD (chronic obstructive pulmonary disease) (HCC)    Degenerative joint disease (DJD) of hip    Diabetes mellitus without complication (HCC)    Dyspnea    GERD (gastroesophageal reflux disease)    takes Omeprazole daily   History of bronchitis yrs ago   History of colon polyps    benign   History of staph infection 10 yrs ago   Hyperlipidemia    takes Atorvastatin daily   Hypertension    Palpitations    Peripheral vascular disease (HCC)    Tobacco use    8 cigarettes daily    Weakness    numbness and tingling in both legs    Wears dentures    full upper   Past Surgical History:  Procedure Laterality Date   APPENDECTOMY     BACK SURGERY  9-10 yrs ago   BREAST BIOPSY Right    Core- neg   COLONOSCOPY     COLONOSCOPY WITH PROPOFOL N/A 09/20/2017   Procedure: COLONOSCOPY WITH PROPOFOL;  Surgeon: Virgel Manifold, MD;  Location: [facility] ENDOSCOPY;  Service: Endoscopy;  Laterality: N/A;   COLONOSCOPY WITH PROPOFOL N/A 04/24/2018   Procedure: COLONOSCOPY WITH PROPOFOL;  Surgeon: Lucilla Lame, MD;  Location: [facility] ENDOSCOPY;  Service: Endoscopy;  Laterality: N/A;   COLONOSCOPY WITH PROPOFOL N/A 06/10/2021   Procedure: COLONOSCOPY WITH BIOPSY;  Surgeon: Lucilla Lame, MD;  Location: [place];  Service: Endoscopy;  Laterality: N/A;   ESOPHAGEAL DILATION  06/10/2021   Procedure: ESOPHAGEAL DILATION;  Surgeon: Lucilla Lame, MD;  Location: [place];   Service: Endoscopy;;  15-18 mm   ESOPHAGOGASTRODUODENOSCOPY (EGD) WITH PROPOFOL N/A 09/20/2017   Procedure: ESOPHAGOGASTRODUODENOSCOPY (EGD) WITH PROPOFOL;  Surgeon: Virgel Manifold, MD;  Location: [facility] ENDOSCOPY;  Service: Endoscopy;  Laterality: N/A;   ESOPHAGOGASTRODUODENOSCOPY (EGD) WITH PROPOFOL N/A 11/20/2018   Procedure: ESOPHAGOGASTRODUODENOSCOPY (EGD) WITH PROPOFOL;  Surgeon: Lucilla Lame, MD;  Location: [facility] ENDOSCOPY;  Service: Endoscopy;  Laterality: N/A;   ESOPHAGOGASTRODUODENOSCOPY (EGD) WITH PROPOFOL N/A 06/10/2021   Procedure: ESOPHAGOGASTRODUODENOSCOPY (EGD) WITH BIOPSY;  Surgeon: Lucilla Lame, MD;  Location: [place];  Service: Endoscopy;  Laterality: N/A;  Diabetic Latex   EYE SURGERY     PERIPHERAL VASCULAR CATHETERIZATION Left 03/17/2016   Procedure: Lower Extremity Angiography;  Surgeon: Algernon Huxley, MD;  Location:  [place] [facility];  Service: Cardiovascular;  Laterality: Left;   POLYPECTOMY N/A 06/10/2021   Procedure: POLYPECTOMY;  Surgeon: Lucilla Lame, MD;  Location: [place];  Service: Endoscopy;  Laterality: N/A;   TOTAL ABDOMINAL HYSTERECTOMY W/ BILATERAL SALPINGOOPHORECTOMY     total   TOTAL SHOULDER ARTHROPLASTY Right 07/24/2019   Procedure: RIGHT TOTAL SHOULDER ARTHROPLASTY;  Surgeon: Lovell Sheehan, MD;  Location: [facility];  Service: Orthopedics;  Laterality: Right;   Family History  Problem Relation Age of Onset   Hyperlipidemia Mother    Hyperlipidemia Father    Melanoma Sister    Breast cancer Neg Hx    Social History   Socioeconomic History   Marital status: Widowed    Spouse name: Not on file   Number of children: 0   Years of education: 8   Highest education level: Not on file  Occupational History   Occupation: disabled  Tobacco Use   Smoking status: Every Day    Packs/day: 2.00    Years: 48.00    Total pack years: 96.00    Types: Cigarettes   Smokeless tobacco: Never   Tobacco comments:    2PPD 11/04/2021     1PDD 01/06/2022  Vaping Use   Vaping Use: Never used  Substance and Sexual Activity   Alcohol use: No   Drug use: No   Sexual activity: Not on file  Other Topics Concern   Not on file  Social History Narrative   Not on file   Social Determinants of Health   Financial Resource Strain: Low Risk  (05/23/2022)   Overall Financial Resource Strain (CARDIA)    Difficulty of Paying Living Expenses: Not hard at all  Food Insecurity: No Food Insecurity (05/23/2022)   Hunger Vital Sign    Worried About Running Out of Food in the Last Year: Never true    Ran Out of Food in the Last Year: Never true  Transportation Needs: No Transportation Needs (05/23/2022)   PRAPARE - Hydrologist (Medical): No    Lack of Transportation (Non-Medical): No  Physical Activity: Inactive (05/23/2022)   Exercise Vital Sign    Days of Exercise per Week: 0 days    Minutes of Exercise per Session: 0 min  Stress: No Stress Concern Present (05/23/2022)   [place]    Feeling of Stress : Not at all  Social Connections: Socially Isolated (05/23/2022)   Social Connection and Isolation Panel [NHANES]    Frequency of Communication with Friends and Family: More than three times a week    Frequency of Social Gatherings with Friends and Family: More than three times a week    Attends Religious Services: Never    Marine scientist or Organizations: No    Attends Archivist Meetings: Never    Marital Status: Divorced    Tobacco Counseling Ready to quit: Not Answered Counseling given: Not Answered Tobacco comments: 2PPD 11/04/2021 1PDD 01/06/2022   Clinical Intake:  Pre-visit preparation completed: Yes  Pain : 0-10 Pain Score: 5  Pain Type: Chronic pain Pain Location: Back     Nutritional Risks: None Diabetes: Yes CBG done?: No Did pt. bring in CBG monitor from home?: No  How often do you need to  have someone help you when you read instructions, pamphlets, or other written materials from your doctor or pharmacy?: 1 - Never  Diabetic?yes Nutrition Risk Assessment:  Has the patient  had any N/V/D within the last 2 months?  Yes  Does the patient have any non-healing wounds?  No  Has the patient had any unintentional weight loss or weight gain?  No   Diabetes:  Is the patient diabetic?  Yes  If diabetic, was a CBG obtained today?  No  Did the patient bring in their glucometer from home?  No  How often do you monitor your CBG's? Twice/week.   Financial Strains and Diabetes Management:  Are you having any financial strains with the device, your supplies or your medication? No .  Does the patient want to be seen by Chronic Care Management for management of their diabetes?  No  Would the patient like to be referred to a Nutritionist or for Diabetic Management?  No   Diabetic Exams:  Diabetic Eye Exam: Completed 01/27/22. Pt has been advised about the importance in completing this exam.   Diabetic Foot Exam: Completed 06/29/21. Pt has been advised about the importance in completing this exam.   Interpreter Needed?: No  Information entered by :: Kirke Shaggy, LPN   Activities of Daily Living    05/23/2022   11:09 AM 06/10/2021    8:39 AM  In your present state of health, do you have any difficulty performing the following activities:  Hearing? 0 0  Vision? 0 0  Difficulty concentrating or making decisions? 0 0  Walking or climbing stairs? 1 0  Dressing or bathing? 0 0  Doing errands, shopping? 0   Preparing Food and eating ? N   Using the Toilet? N   In the past six months, have you accidently leaked urine? N   Do you have problems with loss of bowel control? N   Managing your Medications? N   Managing your Finances? N   Housekeeping or managing your Housekeeping? N     Patient Care Team: Venita Lick, NP as PCP - General (Nurse Practitioner) Kate Sable,  MD as PCP - Cardiology (Cardiology) Guadalupe Maple, MD (Family Medicine)  Indicate any recent Medical Services you may have received from other than [facility] providers in the past year (date may be approximate).     Assessment:   This is a routine wellness examination for [place].  Hearing/Vision screen Hearing Screening - Comments:: No aids Vision Screening - Comments:: No glasses- Dr.Hecker  Dietary issues and exercise activities discussed: Current Exercise Habits: The patient does not participate in regular exercise at present   Goals Addressed             This Visit's Progress    DIET - EAT MORE FRUITS AND VEGETABLES         Depression Screen    05/23/2022   11:06 AM 05/04/2022    8:53 AM 05/04/2022    8:28 AM 01/31/2022    2:44 PM 05/17/2021   11:44 AM 05/10/2021    9:17 AM 04/20/2021    1:34 PM  PHQ 2/9 Scores  PHQ - 2 Score 1 0  0 0    PHQ- 9 Score 1 0  0 0    Exception Documentation   Patient refusal   Patient refusal Patient refusal    Fall Risk    05/23/2022   11:09 AM 05/04/2022    8:28 AM 01/31/2022    2:44 PM 05/17/2021   11:45 AM 04/20/2021    1:34 PM  Fall Risk   Falls in the past year? 0 0 0 0 0  Number falls in past  yr: 0 0 0 0 0  Injury with Fall? 0 0 0 0 0  Risk for fall due to : No Fall Risks No Fall Risks No Fall Risks  No Fall Risks  Follow up Falls prevention discussed;Falls evaluation completed Falls evaluation completed Falls evaluation completed Falls evaluation completed;Falls prevention discussed Falls evaluation completed    FALL RISK PREVENTION PERTAINING TO THE HOME:  Any stairs in or around the home? No  If so, are there any without handrails? No  Home free of loose throw rugs in walkways, pet beds, electrical cords, etc? Yes  Adequate lighting in your home to reduce risk of falls? Yes   ASSISTIVE DEVICES UTILIZED TO PREVENT FALLS:  Life alert? No  Use of a cane, walker or w/c? No  Grab bars in the bathroom? Yes  Shower chair or  bench in shower? Yes  Elevated toilet seat or a handicapped toilet? No    Cognitive Function:        05/23/2022   11:17 AM 05/15/2020   11:19 AM 02/01/2018    2:33 PM 01/25/2017    2:21 PM  6CIT Screen  What Year? 0 points 0 points 0 points 0 points  What month? 0 points 0 points 0 points 0 points  What time? 0 points 0 points 0 points 0 points  Count back from 20 0 points 0 points 0 points 0 points  Months in reverse 0 points 4 points 0 points 0 points  Repeat phrase 0 points 6 points 0 points 0 points  Total Score 0 points 10 points 0 points 0 points    Immunizations Immunization History  Administered Date(s) Administered   Influenza,inj,Quad PF,6+ Mos 02/09/2016, 02/22/2017, 05/09/2018, 12/17/2018, 01/07/2020   Pneumococcal Polysaccharide-23 02/09/2016   Td 04/04/2004, 05/13/2014   Zoster Recombinat (Shingrix) 01/07/2019, 04/22/2019    TDAP status: Up to date  Flu Vaccine status: Declined, Education has been provided regarding the importance of this vaccine but patient still declined. Advised may receive this vaccine at local pharmacy or Health Dept. Aware to provide a copy of the vaccination record if obtained from local pharmacy or Health Dept. Verbalized acceptance and understanding.  Pneumococcal vaccine status: Up to date  Covid-19 vaccine status: Declined, Education has been provided regarding the importance of this vaccine but patient still declined. Advised may receive this vaccine at local pharmacy or Health Dept.or vaccine clinic. Aware to provide a copy of the vaccination record if obtained from local pharmacy or Health Dept. Verbalized acceptance and understanding.  Qualifies for Shingles Vaccine? Yes   Zostavax completed No   Shingrix Completed?: No.    Education has been provided regarding the importance of this vaccine. Patient has been advised to call insurance company to determine out of pocket expense if they have not yet received this vaccine. Advised may  also receive vaccine at local pharmacy or Health Dept. Verbalized acceptance and understanding.  Screening Tests Health Maintenance  Topic Date Due   INFLUENZA VACCINE  07/03/2022 (Originally 11/02/2021)   FOOT EXAM  06/30/2022   MAMMOGRAM  09/23/2022   HEMOGLOBIN A1C  11/02/2022   Lung Cancer Screening  01/22/2023   OPHTHALMOLOGY EXAM  01/28/2023   Diabetic kidney evaluation - eGFR measurement  05/05/2023   Diabetic kidney evaluation - Urine ACR  05/05/2023   Medicare Annual Wellness (AWV)  05/24/2023   DTaP/Tdap/Td (3 - Tdap) 05/13/2024   COLONOSCOPY (Pts 45-67yr Insurance coverage will need to be confirmed)  06/10/2024   Hepatitis C Screening  Completed   HIV Screening  Completed   Zoster Vaccines- Shingrix  Completed   HPV VACCINES  Aged Out   COVID-19 Vaccine  Discontinued    Health Maintenance  There are no preventive care reminders to display for this patient.   Colorectal cancer screening: Type of screening: Colonoscopy. Completed 06/10/21. Repeat every 3 years  Mammogram status: Completed 09/22/21. Repeat every year  Lung Cancer Screening: (Low Dose CT Chest recommended if Age 82-80 years, 30 pack-year currently smoking OR have quit w/in 15years.) does qualify.   Lung Cancer Screening Referral: 01/24/22  Additional Screening:  Hepatitis C Screening: does qualify; Completed 02/11/19  Vision Screening: Recommended annual ophthalmology exams for early detection of glaucoma and other disorders of the eye. Is the patient up to date with their annual eye exam?  Yes  Who is the provider or what is the name of the office in which the patient attends annual eye exams? Dr.Hecker If pt is not established with a provider, would they like to be referred to a provider to establish care? No .   Dental Screening: Recommended annual dental exams for proper oral hygiene  Community Resource Referral / Chronic Care Management: CRR required this visit?  No   CCM required this visit?   No      Plan:     I have personally reviewed and noted the following in the patient's chart:   Medical and social history Use of alcohol, tobacco or illicit drugs  Current medications and supplements including opioid prescriptions. Patient is not currently taking opioid prescriptions. Functional ability and status Nutritional status Physical activity Advanced directives List of other physicians Hospitalizations, surgeries, and ER visits in previous 12 months Vitals Screenings to include cognitive, depression, and falls Referrals and appointments  In addition, I have reviewed and discussed with patient certain preventive protocols, quality metrics, and best practice recommendations. A written personalized care plan for preventive services as well as general preventive health recommendations were provided to patient.     Dionisio David, LPN   [id number]   Nurse Notes: none

## 2022-06-02 DIAGNOSIS — J9611 Chronic respiratory failure with hypoxia: Secondary | ICD-10-CM | POA: Diagnosis not present

## 2022-06-02 DIAGNOSIS — J449 Chronic obstructive pulmonary disease, unspecified: Secondary | ICD-10-CM | POA: Diagnosis not present

## 2022-06-03 ENCOUNTER — Encounter: Payer: Self-pay | Admitting: *Deleted

## 2022-06-03 ENCOUNTER — Telehealth: Payer: Self-pay | Admitting: *Deleted

## 2022-06-03 NOTE — Patient Outreach (Signed)
  Care Coordination   Initial Visit Note   06/03/2022 Name: Stacey Gross MRN: [id number] DOB: Apr 07, 1960  Stacey Gross is a 63 y.o. year old female who sees [place], Henrine Screws T, NP for primary care. I spoke with  Stacey Gross by phone today.  What matters to the patients health and wellness today?  States she is doing well, A1C controlled at 6.4.  Monitors weight, denies any abnormal shortness of breath (has chronic breathing issues with COPD and asthma).  Denies need for follow up call.      Goals Addressed             This Visit's Progress    COMPLETED: Care Coordination Activities - No follow up needed       Care Coordination Interventions: Provided patient with basic written and verbal COPD education on self care/management/and exacerbation prevention Advised patient to track and manage COPD triggers Advised patient to self assesses COPD action plan zone and make appointment with provider if in the yellow zone for 48 hours without improvement Screening for signs and symptoms of depression related to chronic disease state  Assessed social determinant of health barriers AWV completed on 2/19, Vascular visit on 3/15 and PCP on 4/30         SDOH assessments and interventions completed:  Yes  SDOH Interventions Today    Flowsheet Row Most Recent Value  SDOH Interventions   Food Insecurity Interventions Intervention Not Indicated  Housing Interventions Intervention Not Indicated  Transportation Interventions Intervention Not Indicated        Care Coordination Interventions:  Yes, provided   Follow up plan: No further intervention required.   Encounter Outcome:  Pt. Visit Completed   Valente David, RN, MSN, [place] Care Management Care Management Coordinator [phone]

## 2022-06-03 NOTE — Patient Instructions (Signed)
Visit Information  Thank you for taking time to visit with me today. Please don't hesitate to contact me if I can be of assistance to you.  Following are the goals we discussed today:  Keep upcoming appointments with vascular on 3/15 and PCP on 4/30   Please call the Suicide and Crisis Lifeline: 988 call the [place] National Suicide Prevention Lifeline: 208-351-6648 or TTY: (563)031-2666 TTY (986)037-2986) to talk to a trained counselor call 1-800-273-TALK (toll free, 24 hour hotline) call 911 if you are experiencing a Mental Health or [place] or need someone to talk to.  The patient verbalized understanding of instructions, educational materials, and care plan provided today and agreed to receive a mailed copy of patient instructions, educational materials, and care plan.   The patient has been provided with contact information for the care management team and has been advised to call with any health related questions or concerns.   Valente David, RN, MSN, [place] Care Management Care Management Coordinator [phone]

## 2022-06-16 ENCOUNTER — Other Ambulatory Visit (INDEPENDENT_AMBULATORY_CARE_PROVIDER_SITE_OTHER): Payer: Self-pay | Admitting: Vascular Surgery

## 2022-06-16 DIAGNOSIS — I739 Peripheral vascular disease, unspecified: Secondary | ICD-10-CM

## 2022-06-16 DIAGNOSIS — I75023 Atheroembolism of bilateral lower extremities: Secondary | ICD-10-CM

## 2022-06-17 ENCOUNTER — Encounter (INDEPENDENT_AMBULATORY_CARE_PROVIDER_SITE_OTHER): Payer: Self-pay | Admitting: Vascular Surgery

## 2022-06-17 ENCOUNTER — Ambulatory Visit (INDEPENDENT_AMBULATORY_CARE_PROVIDER_SITE_OTHER): Payer: 59 | Admitting: Vascular Surgery

## 2022-06-17 ENCOUNTER — Ambulatory Visit (INDEPENDENT_AMBULATORY_CARE_PROVIDER_SITE_OTHER): Payer: 59

## 2022-06-17 ENCOUNTER — Telehealth: Payer: Self-pay

## 2022-06-17 VITALS — BP 125/72 | HR 74 | Resp 16 | Wt 210.4 lb

## 2022-06-17 DIAGNOSIS — Z981 Arthrodesis status: Secondary | ICD-10-CM

## 2022-06-17 DIAGNOSIS — I75023 Atheroembolism of bilateral lower extremities: Secondary | ICD-10-CM

## 2022-06-17 DIAGNOSIS — I739 Peripheral vascular disease, unspecified: Secondary | ICD-10-CM

## 2022-06-17 DIAGNOSIS — E1159 Type 2 diabetes mellitus with other circulatory complications: Secondary | ICD-10-CM | POA: Diagnosis not present

## 2022-06-17 DIAGNOSIS — E1151 Type 2 diabetes mellitus with diabetic peripheral angiopathy without gangrene: Secondary | ICD-10-CM | POA: Diagnosis not present

## 2022-06-17 DIAGNOSIS — E1169 Type 2 diabetes mellitus with other specified complication: Secondary | ICD-10-CM

## 2022-06-17 DIAGNOSIS — E785 Hyperlipidemia, unspecified: Secondary | ICD-10-CM

## 2022-06-17 DIAGNOSIS — I152 Hypertension secondary to endocrine disorders: Secondary | ICD-10-CM

## 2022-06-17 NOTE — Assessment & Plan Note (Signed)
May contribute to lower extremity neuropathy symptoms.

## 2022-06-17 NOTE — Progress Notes (Signed)
Patient ID: Stacey Gross, female   DOB: [birth year], 62 y.o.   MRN: [id number]  Chief Complaint  Patient presents with   New Patient (Initial Visit)    Ref [facility] consult with ABI    HPI Stacey Gross is a 62 y.o. female.  I am asked to see the patient by Marnee Guarneri for evaluation of pain and discoloration of the toes on both feet.  She was seen previously over 3 years ago for similar issues.  Her toes are painful and always feel cold and burning.  They are purplish discoloration similar on the right to the left.  No open wounds but there has been some peeling of the skin on the toes.  No fevers or chills.  This may be a little worse in cold weather, but she notices it no matter what.  Even yesterday when it was nearly 80 degrees she says she was cold all the time and her feet felt very cold.  We performed noninvasive studies today which demonstrated a right ABI of 1.11 left ABI 1.22 with triphasic waveforms and digit pressures of 133 bilaterally.     Past Medical History:  Diagnosis Date   Asthma    Cancer ([place])    stomach   Chronic back pain    COPD (chronic obstructive pulmonary disease) (HCC)    Degenerative joint disease (DJD) of hip    Diabetes mellitus without complication (HCC)    Dyspnea    GERD (gastroesophageal reflux disease)    takes Omeprazole daily   History of bronchitis yrs ago   History of colon polyps    benign   History of staph infection 10 yrs ago   Hyperlipidemia    takes Atorvastatin daily   Hypertension    Palpitations    Peripheral vascular disease (HCC)    Tobacco use    8 cigarettes daily    Weakness    numbness and tingling in both legs    Wears dentures    full upper    Past Surgical History:  Procedure Laterality Date   APPENDECTOMY     BACK SURGERY  9-10 yrs ago   BREAST BIOPSY Right    Core- neg   COLONOSCOPY     COLONOSCOPY WITH PROPOFOL N/A 09/20/2017   Procedure: COLONOSCOPY WITH PROPOFOL;  Surgeon: Virgel Manifold,  MD;  Location: [facility] ENDOSCOPY;  Service: Endoscopy;  Laterality: N/A;   COLONOSCOPY WITH PROPOFOL N/A 04/24/2018   Procedure: COLONOSCOPY WITH PROPOFOL;  Surgeon: Lucilla Lame, MD;  Location: [facility] ENDOSCOPY;  Service: Endoscopy;  Laterality: N/A;   COLONOSCOPY WITH PROPOFOL N/A 06/10/2021   Procedure: COLONOSCOPY WITH BIOPSY;  Surgeon: Lucilla Lame, MD;  Location: [place];  Service: Endoscopy;  Laterality: N/A;   ESOPHAGEAL DILATION  06/10/2021   Procedure: ESOPHAGEAL DILATION;  Surgeon: Lucilla Lame, MD;  Location: [place];  Service: Endoscopy;;  15-18 mm   ESOPHAGOGASTRODUODENOSCOPY (EGD) WITH PROPOFOL N/A 09/20/2017   Procedure: ESOPHAGOGASTRODUODENOSCOPY (EGD) WITH PROPOFOL;  Surgeon: Virgel Manifold, MD;  Location: [facility] ENDOSCOPY;  Service: Endoscopy;  Laterality: N/A;   ESOPHAGOGASTRODUODENOSCOPY (EGD) WITH PROPOFOL N/A 11/20/2018   Procedure: ESOPHAGOGASTRODUODENOSCOPY (EGD) WITH PROPOFOL;  Surgeon: Lucilla Lame, MD;  Location: [facility] ENDOSCOPY;  Service: Endoscopy;  Laterality: N/A;   ESOPHAGOGASTRODUODENOSCOPY (EGD) WITH PROPOFOL N/A 06/10/2021   Procedure: ESOPHAGOGASTRODUODENOSCOPY (EGD) WITH BIOPSY;  Surgeon: Lucilla Lame, MD;  Location: [place];  Service: Endoscopy;  Laterality: N/A;  Diabetic Latex   EYE SURGERY  PERIPHERAL VASCULAR CATHETERIZATION Left 03/17/2016   Procedure: Lower Extremity Angiography;  Surgeon: Algernon Huxley, MD;  Location: [place] CV LAB;  Service: Cardiovascular;  Laterality: Left;   POLYPECTOMY N/A 06/10/2021   Procedure: POLYPECTOMY;  Surgeon: Lucilla Lame, MD;  Location: [place];  Service: Endoscopy;  Laterality: N/A;   TOTAL ABDOMINAL HYSTERECTOMY W/ BILATERAL SALPINGOOPHORECTOMY     total   TOTAL SHOULDER ARTHROPLASTY Right 07/24/2019   Procedure: RIGHT TOTAL SHOULDER ARTHROPLASTY;  Surgeon: Lovell Sheehan, MD;  Location: [facility] ORS;  Service: Orthopedics;  Laterality: Right;     Family  History  Problem Relation Age of Onset   Hyperlipidemia Mother    Hyperlipidemia Father    Melanoma Sister    Breast cancer Neg Hx       Social History   Tobacco Use   Smoking status: Every Day    Packs/day: 2.00    Years: 48.00    Additional pack years: 0.00    Total pack years: 96.00    Types: Cigarettes   Smokeless tobacco: Never   Tobacco comments:    2PPD 11/04/2021    1PDD 01/06/2022  Vaping Use   Vaping Use: Never used  Substance Use Topics   Alcohol use: No   Drug use: No     Allergies  Allergen Reactions   Doxycycline Hyclate Anaphylaxis, Swelling and Rash   Acetaminophen    Aspirin    Latex Rash    Gloves, underwear elastic, tape   Vancomycin Rash    All mycins    Current Outpatient Medications  Medication Sig Dispense Refill   Accu-Chek Softclix Lancets lancets USE TO CHECK BLOOD SUGAR DAILY 100 each 4   albuterol (PROVENTIL) (2.5 MG/3ML) 0.083% nebulizer solution USE 1 VIAL (3 ML = 2.5 MG TOTAL) BY NEBULIZATION EVERY 6 HOURS AS NEEDED FORWHEEZING OR SHORTNESS OF BREATH 180 mL 0   albuterol (VENTOLIN HFA) 108 (90 Base) MCG/ACT inhaler INHALE 2 PUFFS BY MOUTH INTO THE LUNGS EVERY 6 HOURS AS NEEDED FOR WHEEZING OR SHORTNESS OF BREATH 8.5 g 3   Blood Glucose Monitoring Suppl (ACCU-CHEK GUIDE ME) w/Device KIT Use to check blood sugars 2-3 times daily with goals = <130 fasting in morning and <180 two hours after eating.  Bring blood sugar log to appointments. 1 kit 0   Budeson-Glycopyrrol-Formoterol (BREZTRI AEROSPHERE) 160-9-4.8 MCG/ACT AERO Inhale 2 puffs into the lungs in the morning and at bedtime. 10.7 g 4   Cholecalciferol 1.25 MG (50000 UT) TABS Take 1 tablet by mouth once a week. 12 tablet 4   citalopram (CELEXA) 20 MG tablet Take 1 tablet (20 mg total) by mouth daily. 90 tablet 4   cyanocobalamin (VITAMIN B12) 1000 MCG tablet Take 1 tablet (1,000 mcg total) by mouth daily. 90 tablet 4   furosemide (LASIX) 20 MG tablet TAKE 1 TABLET EVERY DAY AS NEEDED  90 tablet 3   glucose blood test strip Use to check blood sugar three times a day. 200 each 12   hydrOXYzine (VISTARIL) 25 MG capsule Take 1 capsule (25 mg total) by mouth every 8 (eight) hours as needed. 270 capsule 4   lidocaine (LIDODERM) 5 % Place 1 patch onto the skin daily. Remove & Discard patch within 12 hours or as directed by MD 30 patch 0   metFORMIN (GLUCOPHAGE) 500 MG tablet Take 500 mg by mouth 2 (two) times daily.     olmesartan (BENICAR) 5 MG tablet Take 1 tablet (5 mg total) by mouth daily.  90 tablet 4   pantoprazole (PROTONIX) 40 MG tablet Take 1 tablet (40 mg total) by mouth daily. 90 tablet 4   prochlorperazine (COMPAZINE) 10 MG tablet Take 10 mg by mouth every 6 (six) hours as needed.     rosuvastatin (CRESTOR) 40 MG tablet Take 1 tablet (40 mg total) by mouth daily. 90 tablet 4   sitaGLIPtin (JANUVIA) 100 MG tablet Take 1 tablet (100 mg total) by mouth daily. 90 tablet 4   traZODone (DESYREL) 100 MG tablet TAKE 1 TABLET BY MOUTH AT BEDTIME AS NEEDED FOR SLEEP 90 tablet 4   verapamil (CALAN-SR) 120 MG CR tablet Take 1 tablet (120 mg total) by mouth at bedtime. 90 tablet 4   lactobacillus acidophilus & bulgar (LACTINEX) chewable tablet Chew 1 tablet by mouth 3 (three) times daily as needed (take after every stools). (Patient not taking: Reported on 05/23/2022) 30 tablet 1   nystatin (MYCOSTATIN) 100000 UNIT/ML suspension Take 5 mLs (500,000 Units total) by mouth 4 (four) times daily. (Patient not taking: Reported on 05/23/2022) 60 mL 0   No current facility-administered medications for this visit.          REVIEW OF SYSTEMS (Negative unless checked)   Constitutional: [] Weight loss  [] Fever  [] Chills Cardiac: [] Chest pain   [] Chest pressure   [] Palpitations   [] Shortness of breath when laying flat   [] Shortness of breath at rest   [] Shortness of breath with exertion. Vascular:  [x] Pain in legs with walking   [] Pain in legs at rest   [] Pain in legs when laying flat    [x] Claudication   [] Pain in feet when walking  [x] Pain in feet at rest  [] Pain in feet when laying flat   [] History of DVT   [] Phlebitis   [] Swelling in legs   [] Varicose veins   [] Non-healing ulcers Pulmonary:   [] Uses home oxygen   [] Productive cough   [] Hemoptysis   [] Wheeze  [] COPD   [] Asthma Neurologic:  [] Dizziness  [] Blackouts   [] Seizures   [] History of stroke   [] History of TIA  [] Aphasia   [] Temporary blindness   [] Dysphagia   [] Weakness or numbness in arms   [] Weakness or numbness in legs Musculoskeletal:  [] Arthritis   [] Joint swelling   [] Joint pain   [x] Low back pain Hematologic:  [] Easy bruising  [] Easy bleeding   [] Hypercoagulable state   [] Anemic  [] Hepatitis Gastrointestinal:  [] Blood in stool   [] Vomiting blood  [] Gastroesophageal reflux/heartburn   [] Abdominal pain Genitourinary:  [] Chronic kidney disease   [] Difficult urination  [] Frequent urination  [] Burning with urination   [] Hematuria Skin:  [] Rashes   [] Ulcers   [] Wounds Psychological:  [] History of anxiety   []  History of major depression.      Physical Exam BP 125/72 (BP Location: Left Arm)   Pulse 74   Resp 16   Wt 210 lb 6.4 oz (95.4 kg)   BMI 32.95 kg/m  Gen:  WD/WN, NAD Head: [place]/AT, No temporalis wasting.  Ear/Nose/Throat: Hearing grossly intact, nares w/o erythema or drainage, oropharynx w/o Erythema/Exudate Eyes: Conjunctiva clear, sclera non-icteric  Neck: trachea midline.  No JVD.  Pulmonary:  Good air movement, respirations not labored, no use of accessory muscles  Cardiac: RRR, no JVD Vascular:  Vessel Right Left  Radial Palpable Palpable                          DP Palpable  Palpable   PT Palpable  Palpable  Gastrointestinal:. No masses, surgical incisions, or scars. Musculoskeletal: M/S 5/5 throughout.  Extremities without ischemic changes.  No deformity or atrophy.  Purplish discoloration of multiple toes on both feet.  No significant lower extremity edema. Neurologic: Sensation  grossly intact in extremities.  Symmetrical.  Speech is fluent. Motor exam as listed above. Psychiatric: Judgment intact, Mood & affect appropriate for pt's clinical situation. Dermatologic: No rashes or ulcers noted.  No cellulitis or open wounds.    Radiology No results found.  Labs Recent Results (from the past 2160 hour(s))  Bayer DCA Hb A1c Waived     Status: Abnormal   Collection Time: 05/04/22  8:36 AM  Result Value Ref Range   HB A1C (BAYER DCA - WAIVED) 6.4 (H) 4.8 - 5.6 %    Comment:          Prediabetes: 5.7 - 6.4          Diabetes: >6.4          Glycemic control for adults with diabetes: <7.0   Microalbumin, Urine Waived     Status: Abnormal   Collection Time: 05/04/22  8:36 AM  Result Value Ref Range   Microalb, Ur Waived 80 (H) 0 - 19 mg/L   Creatinine, Urine Waived 200 10 - 300 mg/dL   Microalb/Creat Ratio <30 <30 mg/g    Comment:                              Abnormal:       30 - 300                         High Abnormal:           >300   CBC with Differential/Platelet     Status: None   Collection Time: 05/04/22  8:38 AM  Result Value Ref Range   WBC 8.8 3.4 - 10.8 x10E3/uL   RBC 4.59 3.77 - 5.28 x10E6/uL   Hemoglobin 13.6 11.1 - 15.9 g/dL   Hematocrit 41.8 34.0 - 46.6 %   MCV 91 79 - 97 fL   MCH 29.6 26.6 - 33.0 pg   MCHC 32.5 31.5 - 35.7 g/dL   RDW 12.2 11.7 - 15.4 %   Platelets 267 150 - 450 x10E3/uL   Neutrophils 65 Not Estab. %   Lymphs 28 Not Estab. %   Monocytes 5 Not Estab. %   Eos 1 Not Estab. %   Basos 1 Not Estab. %   Neutrophils Absolute 5.7 1.4 - 7.0 x10E3/uL   Lymphocytes Absolute 2.4 0.7 - 3.1 x10E3/uL   Monocytes Absolute 0.4 0.1 - 0.9 x10E3/uL   EOS (ABSOLUTE) 0.1 0.0 - 0.4 x10E3/uL   Basophils Absolute 0.1 0.0 - 0.2 x10E3/uL   Immature Granulocytes 0 Not Estab. %   Immature Grans (Abs) 0.0 0.0 - 0.1 x10E3/uL  Comprehensive metabolic panel     Status: Abnormal   Collection Time: 05/04/22  8:38 AM  Result Value Ref Range   Glucose  101 (H) 70 - 99 mg/dL   BUN 10 8 - 27 mg/dL   Creatinine, Ser 0.98 0.57 - 1.00 mg/dL   eGFR 65 >59 mL/min/1.73   BUN/Creatinine Ratio 10 (L) 12 - 28   Sodium 140 134 - 144 mmol/L   Potassium 4.5 3.5 - 5.2 mmol/L   Chloride 98 96 - 106 mmol/L   CO2 26 20 - 29 mmol/L  Calcium 9.7 8.7 - 10.3 mg/dL   Total Protein 6.3 6.0 - 8.5 g/dL   Albumin 4.5 3.9 - 4.9 g/dL   Globulin, Total 1.8 1.5 - 4.5 g/dL   Albumin/Globulin Ratio 2.5 (H) 1.2 - 2.2   Bilirubin Total 0.3 0.0 - 1.2 mg/dL   Alkaline Phosphatase 79 44 - 121 IU/L   AST 20 0 - 40 IU/L   ALT 14 0 - 32 IU/L  Lipid Panel w/o Chol/HDL Ratio     Status: Abnormal   Collection Time: 05/04/22  8:38 AM  Result Value Ref Range   Cholesterol, Total 114 100 - 199 mg/dL   Triglycerides 158 (H) 0 - 149 mg/dL   HDL 47 >39 mg/dL   VLDL Cholesterol Cal 26 5 - 40 mg/dL   LDL Chol Calc (NIH) 41 0 - 99 mg/dL  TSH     Status: None   Collection Time: 05/04/22  8:38 AM  Result Value Ref Range   TSH 1.210 0.450 - 4.500 uIU/mL  VITAMIN D 25 Hydroxy (Vit-D Deficiency, Fractures)     Status: Abnormal   Collection Time: 05/04/22  8:38 AM  Result Value Ref Range   Vit D, 25-Hydroxy 27.2 (L) 30.0 - 100.0 ng/mL    Comment: Vitamin D deficiency has been defined by the Institute of Medicine and an Endocrine Society practice guideline as a level of serum 25-OH vitamin D less than 20 ng/mL (1,2). The Endocrine Society went on to further define vitamin D insufficiency as a level between 21 and 29 ng/mL (2). 1. IOM (Institute of Medicine). 2010. Dietary reference    intakes for calcium and D. [place]: The    [employer]. 2. Holick MF, Binkley [place], Bischoff-Ferrari HA, et al.    Evaluation, treatment, and prevention of vitamin D    deficiency: an Endocrine Society clinical practice    guideline. JCEM. 2011 Jul; 96(7):1911-30.   Vitamin B12     Status: None   Collection Time: 05/04/22  8:38 AM  Result Value Ref Range   Vitamin B-12 490  232 - 1,245 pg/mL    Assessment/Plan: Hyperlipidemia lipid control important in reducing the progression of atherosclerotic disease. Continue statin therapy     Diabetes blood glucose control important in reducing the progression of atherosclerotic disease. Also, involved in wound healing. On appropriate medications.   Hypertension blood pressure control important in reducing the progression of atherosclerotic disease. On appropriate oral medications.  S/P lumbar fusion May contribute to lower extremity neuropathy symptoms.  PVD (peripheral vascular disease) ([place]) We performed noninvasive studies today which demonstrated a right ABI of 1.11 left ABI 1.22 with triphasic waveforms and digit pressures of 133 bilaterally.  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  No limb threatening ischemia.  No change in her medications at current.  Recheck in 1 year.  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  No limb threatening ischemia.  No change in her medications at current.  Recheck in 1 year.      Leotis Pain 06/17/2022, 9:50 AM   This note  was created with Dragon medical transcription system.  Any errors from dictation are unintentional.

## 2022-06-17 NOTE — Telephone Encounter (Signed)
   Telephone encounter was:  Successful.  06/17/2022 Name: [name] MRN: [id number] DOB: 1961-02-15  Stacey Gross is a 62 y.o. year old female who is a primary care patient of Cannady, Barbaraann Faster, NP . The community resource team was consulted for assistance with Returned message left on [facility] Concierge line regarding Care Coordination packet.   Care guide performed the following interventions: Spoke with patient and her sister Rip Harbour about packet of information sent from [facility], RN, Care Coordination. Patient was confused and thought is was from [employer]. Rip Harbour stated that she has left a message on voicemail for Brayton Layman to return her call at [phone].  Follow Up Plan:  No further follow up planned at this time. The patient has been provided with needed resources.  [place] Resource Care Guide   ??[name]  ?? [id number]   Website: [email or web address]  [email or web address]

## 2022-06-17 NOTE — Assessment & Plan Note (Signed)
We performed noninvasive studies today which demonstrated a right ABI of 1.11 left ABI 1.22 with triphasic waveforms and digit pressures of 133 bilaterally.  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  No limb threatening ischemia.  No change in her medications at current.  Recheck in 1 year.

## 2022-06-20 ENCOUNTER — Telehealth: Payer: Self-pay | Admitting: *Deleted

## 2022-06-20 LAB — VAS US ABI WITH/WO TBI
Left ABI: 1.22
Right ABI: 1.11

## 2022-06-20 NOTE — Patient Outreach (Signed)
  Care Coordination   Follow Up Visit Note   06/20/2022 Name: Stacey Gross MRN: [id number] DOB: 10-06-1960  Stacey Gross is a 62 y.o. year old female who sees Stacey Gross, Stacey Screws T, NP for primary care. I spoke with Stacey Gross, sister of Stacey Gross by phone today.  What matters to the patients health and wellness today?  Sister and patient wanted clarification about program after receiving information in mail.  They thought it would replace [employer] benefits, difference between [facility] and [employer] explained, they verbalized understanding.  Denies any urgent concerns, encouraged to contact this care manager with questions.      SDOH assessments and interventions completed:  No     Care Coordination Interventions:  Yes, provided   Follow up plan: No further intervention required.   Encounter Outcome:  Pt. Visit Completed   Valente David, RN, MSN [facility] Care Management Care Management Coordinator [phone]

## 2022-07-22 ENCOUNTER — Encounter: Payer: Self-pay | Admitting: Pulmonary Disease

## 2022-07-31 NOTE — Patient Instructions (Signed)
Diabetes Mellitus Basics  Diabetes mellitus, or diabetes, is a long-term (chronic) disease. It occurs when the body does not properly use sugar (glucose) that is released from food after you eat. Diabetes mellitus may be caused by one or both of these problems: Your pancreas does not make enough of a hormone called insulin. Your body does not react in a normal way to the insulin that it makes. Insulin lets glucose enter cells in your body. This gives you energy. If you have diabetes, glucose cannot get into cells. This causes high blood glucose (hyperglycemia). How to treat and manage diabetes You may need to take insulin or other diabetes medicines daily to keep your glucose in balance. If you are prescribed insulin, you will learn how to give yourself insulin by injection. You may need to adjust the amount of insulin you take based on the foods that you eat. You will need to check your blood glucose levels using a glucose monitor as told by your health care provider. The readings can help determine if you have low or high blood glucose. Generally, you should have these blood glucose levels: Before meals (preprandial): 80-130 mg/dL (4.4-7.2 mmol/L). After meals (postprandial): below 180 mg/dL (10 mmol/L). Hemoglobin A1c (HbA1c) level: less than 7%. Your health care provider will set treatment goals for you. Keep all follow-up visits. This is important. Follow these instructions at home: Diabetes medicines Take your diabetes medicines every day as told by your health care provider. List your diabetes medicines here: Name of medicine: ______________________________ Amount (dose): _______________ Time (a.m./p.m.): _______________ Notes: ___________________________________ Name of medicine: ______________________________ Amount (dose): _______________ Time (a.m./p.m.): _______________ Notes: ___________________________________ Name of medicine: ______________________________ Amount (dose):  _______________ Time (a.m./p.m.): _______________ Notes: ___________________________________ Insulin If you use insulin, list the types of insulin you use here: Insulin type: ______________________________ Amount (dose): _______________ Time (a.m./p.m.): _______________Notes: ___________________________________ Insulin type: ______________________________ Amount (dose): _______________ Time (a.m./p.m.): _______________ Notes: ___________________________________ Insulin type: ______________________________ Amount (dose): _______________ Time (a.m./p.m.): _______________ Notes: ___________________________________ Insulin type: ______________________________ Amount (dose): _______________ Time (a.m./p.m.): _______________ Notes: ___________________________________ Insulin type: ______________________________ Amount (dose): _______________ Time (a.m./p.m.): _______________ Notes: ___________________________________ Managing blood glucose  Check your blood glucose levels using a glucose monitor as told by your health care provider. Write down the times that you check your glucose levels here: Time: _______________ Notes: ___________________________________ Time: _______________ Notes: ___________________________________ Time: _______________ Notes: ___________________________________ Time: _______________ Notes: ___________________________________ Time: _______________ Notes: ___________________________________ Time: _______________ Notes: ___________________________________  Low blood glucose Low blood glucose (hypoglycemia) is when glucose is at or below 70 mg/dL (3.9 mmol/L). Symptoms may include: Feeling: Hungry. Sweaty and clammy. Irritable or easily upset. Dizzy. Sleepy. Having: A fast heartbeat. A headache. A change in your vision. Numbness around the mouth, lips, or tongue. Having trouble with: Moving (coordination). Sleeping. Treating low blood glucose To treat low blood  glucose, eat or drink something containing sugar right away. If you can think clearly and swallow safely, follow the 15:15 rule: Take 15 grams of a fast-acting carb (carbohydrate), as told by your health care provider. Some fast-acting carbs are: Glucose tablets: take 3-4 tablets. Hard candy: eat 3-5 pieces. Fruit juice: drink 4 oz (120 mL). Regular (not diet) soda: drink 4-6 oz (120-180 mL). Honey or sugar: eat 1 Tbsp (15 mL). Check your blood glucose levels 15 minutes after you take the carb. If your glucose is still at or below 70 mg/dL (3.9 mmol/L), take 15 grams of a carb again. If your glucose does not go above 70 mg/dL (3.9 mmol/L) after   3 tries, get help right away. After your glucose goes back to normal, eat a meal or a snack within 1 hour. Treating very low blood glucose If your glucose is at or below 54 mg/dL (3 mmol/L), you have very low blood glucose (severe hypoglycemia). This is an emergency. Do not wait to see if the symptoms will go away. Get medical help right away. Call your local emergency services (911 in the [place] Do not drive yourself to the hospital. Questions to ask your health care provider Should I talk with a diabetes educator? What equipment will I need to care for myself at home? What diabetes medicines do I need? When should I take them? How often do I need to check my blood glucose levels? What number can I call if I have questions? When is my follow-up visit? Where can I find a support group for people with diabetes? Where to find more information American Diabetes Association: www.diabetes.org Association of Diabetes Care and Education Specialists: www.diabeteseducator.org Contact a health care provider if: Your blood glucose is at or above 240 mg/dL (13.3 mmol/L) for 2 days in a row. You have been sick or have had a fever for 2 days or more, and you are not getting better. You have any of these problems for more than 6 hours: You cannot eat or  drink. You feel nauseous. You vomit. You have diarrhea. Get help right away if: Your blood glucose is lower than 54 mg/dL (3 mmol/L). You get confused. You have trouble thinking clearly. You have trouble breathing. These symptoms may represent a serious problem that is an emergency. Do not wait to see if the symptoms will go away. Get medical help right away. Call your local emergency services (911 in the [place] Do not drive yourself to the hospital. Summary Diabetes mellitus is a chronic disease that occurs when the body does not properly use sugar (glucose) that is released from food after you eat. Take insulin and diabetes medicines as told. Check your blood glucose every day, as often as told. Keep all follow-up visits. This is important. This information is not intended to replace advice given to you by your health care provider. Make sure you discuss any questions you have with your health care provider. Document Revised: 07/23/2019 Document Reviewed: 07/23/2019 Elsevier Patient Education  2023 Elsevier Inc.  

## 2022-08-02 ENCOUNTER — Encounter: Payer: Self-pay | Admitting: Nurse Practitioner

## 2022-08-02 ENCOUNTER — Ambulatory Visit (INDEPENDENT_AMBULATORY_CARE_PROVIDER_SITE_OTHER): Payer: 59 | Admitting: Nurse Practitioner

## 2022-08-02 VITALS — BP 108/68 | HR 70 | Temp 97.6°F | Ht 67.01 in | Wt 209.9 lb

## 2022-08-02 DIAGNOSIS — I5032 Chronic diastolic (congestive) heart failure: Secondary | ICD-10-CM | POA: Diagnosis not present

## 2022-08-02 DIAGNOSIS — E6609 Other obesity due to excess calories: Secondary | ICD-10-CM

## 2022-08-02 DIAGNOSIS — J9611 Chronic respiratory failure with hypoxia: Secondary | ICD-10-CM | POA: Diagnosis not present

## 2022-08-02 DIAGNOSIS — D692 Other nonthrombocytopenic purpura: Secondary | ICD-10-CM | POA: Diagnosis not present

## 2022-08-02 DIAGNOSIS — I739 Peripheral vascular disease, unspecified: Secondary | ICD-10-CM | POA: Diagnosis not present

## 2022-08-02 DIAGNOSIS — F411 Generalized anxiety disorder: Secondary | ICD-10-CM

## 2022-08-02 DIAGNOSIS — E1169 Type 2 diabetes mellitus with other specified complication: Secondary | ICD-10-CM | POA: Diagnosis not present

## 2022-08-02 DIAGNOSIS — E785 Hyperlipidemia, unspecified: Secondary | ICD-10-CM | POA: Diagnosis not present

## 2022-08-02 DIAGNOSIS — J432 Centrilobular emphysema: Secondary | ICD-10-CM | POA: Diagnosis not present

## 2022-08-02 DIAGNOSIS — I75023 Atheroembolism of bilateral lower extremities: Secondary | ICD-10-CM | POA: Diagnosis not present

## 2022-08-02 DIAGNOSIS — I700 Atherosclerosis of aorta: Secondary | ICD-10-CM

## 2022-08-02 DIAGNOSIS — E1151 Type 2 diabetes mellitus with diabetic peripheral angiopathy without gangrene: Secondary | ICD-10-CM | POA: Diagnosis not present

## 2022-08-02 DIAGNOSIS — K219 Gastro-esophageal reflux disease without esophagitis: Secondary | ICD-10-CM

## 2022-08-02 DIAGNOSIS — J449 Chronic obstructive pulmonary disease, unspecified: Secondary | ICD-10-CM | POA: Diagnosis not present

## 2022-08-02 DIAGNOSIS — K760 Fatty (change of) liver, not elsewhere classified: Secondary | ICD-10-CM | POA: Diagnosis not present

## 2022-08-02 DIAGNOSIS — Z6832 Body mass index (BMI) 32.0-32.9, adult: Secondary | ICD-10-CM

## 2022-08-02 DIAGNOSIS — G8929 Other chronic pain: Secondary | ICD-10-CM

## 2022-08-02 DIAGNOSIS — J9612 Chronic respiratory failure with hypercapnia: Secondary | ICD-10-CM

## 2022-08-02 DIAGNOSIS — E1159 Type 2 diabetes mellitus with other circulatory complications: Secondary | ICD-10-CM | POA: Diagnosis not present

## 2022-08-02 DIAGNOSIS — I152 Hypertension secondary to endocrine disorders: Secondary | ICD-10-CM

## 2022-08-02 DIAGNOSIS — F17219 Nicotine dependence, cigarettes, with unspecified nicotine-induced disorders: Secondary | ICD-10-CM

## 2022-08-02 DIAGNOSIS — M545 Low back pain, unspecified: Secondary | ICD-10-CM | POA: Insufficient documentation

## 2022-08-02 LAB — BAYER DCA HB A1C WAIVED: HB A1C (BAYER DCA - WAIVED): 6.6 % — ABNORMAL HIGH (ref 4.8–5.6)

## 2022-08-02 MED ORDER — VERAPAMIL HCL 80 MG PO TABS: 80.0000 mg | ORAL_TABLET | Freq: Every day | ORAL | 1 refills | Status: DC

## 2022-08-02 MED ORDER — PREGABALIN 25 MG PO CAPS: 25.0000 mg | ORAL_CAPSULE | Freq: Every day | ORAL | 1 refills | Status: DC

## 2022-08-02 MED ORDER — METFORMIN HCL 500 MG PO TABS: 500.0000 mg | ORAL_TABLET | Freq: Two times a day (BID) | ORAL | 4 refills | Status: DC

## 2022-08-02 MED ORDER — CHOLECALCIFEROL 1.25 MG (50000 UT) PO TABS: 1.0000 | ORAL_TABLET | ORAL | 4 refills | Status: DC

## 2022-08-02 MED ORDER — BREZTRI AEROSPHERE 160-9-4.8 MCG/ACT IN AERO: 2.0000 | INHALATION_SPRAY | Freq: Two times a day (BID) | RESPIRATORY_TRACT | 4 refills | Status: DC

## 2022-08-02 MED ORDER — DULOXETINE HCL 30 MG PO CPEP: ORAL_CAPSULE | ORAL | 1 refills | Status: DC

## 2022-08-02 NOTE — Progress Notes (Signed)
BP 108/68 (BP Location: Left Arm, Patient Position: Sitting, Cuff Size: Normal)   Pulse 70   Temp 97.6 F (36.4 C) (Oral)   Ht 5' 7.01" (1.702 m)   Wt 209 lb 14.4 oz (95.2 kg)   SpO2 95%   BMI 32.87 kg/m    Subjective:    Patient ID: Stacey Gross, female    DOB: 12/14/1960, 62 y.o.   MRN: [id number]  HPI: Stacey Gross is a 62 y.o. female  Chief Complaint  Patient presents with   Diabetes   Hypertension   Hyperlipidemia   COPD   Mood   PVD   Friend at bedside to assist with HPI.  DIABETES A1c 6.4% January.  Continues on Metformin 500 MG BID and Januvia 100 MG daily.  Metformin does cause increase in bowel movements -- tried increased dosing and caused worsening issues.  In past GLP1 medications caused major stomach issues and Farxiga caused yeast infections. Hypoglycemic episodes:no Polydipsia/polyuria: no Visual disturbance: no Chest pain: no Paresthesias: no Glucose Monitoring: yes             Accucheck frequency: daily             Fasting glucose: 110 yesterday -- 100 to 110             Post prandial:             Evening:             Before meals: Taking Insulin?: no             Long acting insulin:             Short acting insulin: Blood Pressure Monitoring: not checking Retinal Examination: Up To Date Foot Exam: Up to Date Pneumovax: Up to Date Influenza: Not Up to Date -- refuses these Aspirin: no    HYPERTENSION / HYPERLIPIDEMIA/CHF Taking Olmesartan 5 MG, Verapamil 120 MG daily, Lasix 20 MG PRN, and Crestor 40 MG daily.  Follows with cardiology and last visit 11/24/21 with no changes.  EF on echo June 2022 was 60-65%. Has been having dizzy spells for awhile, over past months, notices it more with position changes (sitting to standing).  Dizziness lasts for a few minutes and goes away.    Followed by vascular for her blue toe syndrome and last saw 06/17/22, to follow annually. No changes made, she was frustrated with visit.  Her toes occasionally  turn blue and blister, then clear up.   Satisfied with current treatment? yes Duration of hypertension: chronic BP monitoring frequency: not checking BP range:  BP medication side effects: no Duration of hyperlipidemia: chronic Cholesterol medication side effects: no Cholesterol supplements: none Medication compliance: good compliance Aspirin: no Recent stressors: no Recurrent headaches: no Visual changes: no Palpitations: occasional Dyspnea: occasional Chest pain: no Lower extremity edema: occasional Dizzy/lightheaded: yes, as above  COPD Continues to smoke, currently about 1 PPD, not interested in quitting -- has been smoking since age 45.  Lung CT screening 01/21/22 == emphysema and aortic atherosclerosis noted, to continue annually.  Pulmonary visit 01/06/22-- they recommended to continue Breztri and Albuterol as needed.  Uses oxygen at night, has missed a few times of wearing.  Uses Albuterol quite often, as if tries to do something can not always do it.   COPD status: stable Satisfied with current treatment?: yes Oxygen use: no Dyspnea frequency: occasional  Cough frequency: at baseline Rescue inhaler frequency:  every day in morning Limitation of activity:  no Productive cough: no Last Spirometry: with pulmonary Pneumovax: Up to Date Influenza: Refuses  ANXIETY/STRESS Currently only taking Celexa daily, is not working 100%, is more depressed.  Has chronic back pain and does not like leaving house -- can not take Tylenol.  Using lidocaine patches.  Had surgery in 2016 to lower back.  Severe spinal stenosis L4-L5.  Has had injections in past with no benefit.    In past we have used Wellbutrin, Duloxetine, Prozac. Duration:stable Anxious mood: yes  Excessive worrying: no Irritability: no Sweating: no Nausea: no Palpitations:no Hyperventilation: no Panic attacks: no Agoraphobia: no  Obscessions/compulsions: no Depressed mood: no    08-14-2022    9:44 AM 05/23/2022    11:06 AM 05/04/2022    8:53 AM 01/31/2022    2:44 PM 05/17/2021   11:44 AM  Depression screen PHQ 2/9  Decreased Interest 2 0 0 0 0  Down, Depressed, Hopeless 2 1 0 0 0  PHQ - 2 Score 4 1 0 0 0  Altered sleeping 2 0 0 0 0  Tired, decreased energy 2 0 0 0 0  Change in appetite 0 0 0 0 0  Feeling bad or failure about yourself  0 0 0 0 0  Trouble concentrating 0 0 0 0 0  Moving slowly or fidgety/restless 0 0 0 0 0  Suicidal thoughts 0 0 0 0 0  PHQ-9 Score 8 1 0 0 0  Difficult doing work/chores Somewhat difficult Not difficult at all Not difficult at all Not difficult at all Not difficult at all  Anhedonia: no Weight changes: no Insomnia: yes hard to fall asleep -- due to pain Hypersomnia: no Fatigue/loss of energy: no Feelings of worthlessness: no Feelings of guilt: no Impaired concentration/indecisiveness: no Suicidal ideations: no  Crying spells: no Recent Stressors/Life Changes: no   Relationship problems: no   Family stress: no     Financial stress: no    Job stress: no    Recent death/loss: no    08-14-22    9:45 AM 05/04/2022    8:54 AM 01/31/2022    2:45 PM 03/31/2021   11:56 AM  GAD 7 : Generalized Anxiety Score  Nervous, Anxious, on Edge 2 0 0 0  Control/stop worrying 2 0 0 1  Worry too much - different things 2 0 0 1  Trouble relaxing 0 0 0 0  Restless 0 0 0 0  Easily annoyed or irritable 2 0 0 2  Afraid - awful might happen 2 0 0 0  Total GAD 7 Score 10 0 0 4  Anxiety Difficulty Somewhat difficult Not difficult at all Not difficult at all Not difficult at all   Relevant past medical, surgical, family and social history reviewed and updated as indicated. Interim medical history since our last visit reviewed. Allergies and medications reviewed and updated.  Review of Systems  Constitutional:  Negative for activity change, appetite change, diaphoresis, fatigue and fever.  Respiratory:  Positive for cough (baseline). Negative for chest tightness, shortness  of breath and wheezing.   Cardiovascular:  Positive for leg swelling (occasional). Negative for chest pain and palpitations.  Gastrointestinal: Negative.   Endocrine: Negative for polydipsia, polyphagia and polyuria.  Musculoskeletal:  Positive for back pain.  Neurological:  Positive for dizziness. Negative for syncope, facial asymmetry, weakness, light-headedness, numbness and headaches.  Psychiatric/Behavioral: Negative.  Negative for sleep disturbance and suicidal ideas.     Per HPI unless specifically indicated above     Objective:  BP 108/68 (BP Location: Left Arm, Patient Position: Sitting, Cuff Size: Normal)   Pulse 70   Temp 97.6 F (36.4 C) (Oral)   Ht 5' 7.01" (1.702 m)   Wt 209 lb 14.4 oz (95.2 kg)   SpO2 95%   BMI 32.87 kg/m   Wt Readings from Last 3 Encounters:  08/02/22 209 lb 14.4 oz (95.2 kg)  06/17/22 210 lb 6.4 oz (95.4 kg)  05/23/22 212 lb (96.2 kg)    Physical Exam Vitals and nursing note reviewed.  Constitutional:      General: She is awake.     Appearance: She is well-developed and well-groomed. She is obese.  HENT:     Head: Normocephalic.     Right Ear: Hearing, tympanic membrane, ear canal and external ear normal.     Left Ear: Hearing, tympanic membrane, ear canal and external ear normal.     Nose:     Right Sinus: No maxillary sinus tenderness or frontal sinus tenderness.     Left Sinus: No maxillary sinus tenderness or frontal sinus tenderness.  Eyes:     General: Lids are normal.        Right eye: No discharge.        Left eye: No discharge.     Conjunctiva/sclera: Conjunctivae normal.     Pupils: Pupils are equal, round, and reactive to light.  Neck:     Thyroid: No thyromegaly.     Vascular: No carotid bruit.  Cardiovascular:     Rate and Rhythm: Normal rate and regular rhythm.     Heart sounds: Normal heart sounds. No murmur heard.    No gallop.  Pulmonary:     Effort: Pulmonary effort is normal. No accessory muscle usage or  respiratory distress.     Breath sounds: Wheezing present.     Comments: Scattered expiratory wheezes throughout noted - baseline. Abdominal:     General: Bowel sounds are normal.     Palpations: Abdomen is soft.  Musculoskeletal:     Cervical back: Normal range of motion and neck supple.     Right lower leg: No edema.     Left lower leg: No edema.  Skin:    General: Skin is warm and dry.     Comments: Scattered pale purple bruising bilateral upper extremities.  Neurological:     Mental Status: She is alert and oriented to person, place, and time.  Psychiatric:        Attention and Perception: Attention normal.        Mood and Affect: Mood normal.        Speech: Speech normal.        Behavior: Behavior normal. Behavior is cooperative.        Thought Content: Thought content normal.    Results for orders placed or performed in visit on 08/02/22  Bayer DCA Hb A1c Waived  Result Value Ref Range   HB A1C (BAYER DCA - WAIVED) 6.6 (H) 4.8 - 5.6 %      Assessment & Plan:   Problem List Items Addressed This Visit       Cardiovascular and Mediastinum   Aortic atherosclerosis (HCC)    Chronic, ongoing noted on lung CT screening.  Continue statin and ASA daily for prevention.  Continue collaboration with vascular.  Recommend complete cessation smoking.      Relevant Medications   verapamil (CALAN) 80 MG tablet   Other Relevant Orders   Comprehensive metabolic panel   Lipid Panel  w/o Chol/HDL Ratio   Blue toe syndrome (HCC)    Chronic, ongoing.  Continue collaboration with vascular.  Recent note reviewed.      Relevant Medications   verapamil (CALAN) 80 MG tablet   Chronic diastolic heart failure [facility])    Per cardiology notes on review.  She is to follow-up with them in 3 months, recommend she call and schedule with them.  At this time continue current medication regimen and recommend: - Reminded to call for an overnight weight gain of >2 pounds or a weekly weight gain of >5  pounds - not adding salt to food and read food labels. Reviewed the importance of keeping daily sodium intake to 2000mg  daily. - Avoid NSAIDs      Relevant Medications   verapamil (CALAN) 80 MG tablet   Other Relevant Orders   Comprehensive metabolic panel   Hypertension associated with diabetes (HCC)    Chronic, stable.  BP well below goal in office today with more hypotensive levels and dizziness recently.  Continue Olmesartan 5 MG doe kidney protection and reduce Verapamil to 80 MG daily, discuss with patient and support system.  Recommend she monitor BP at least a few mornings a week at home and document.  DASH diet at home.  Labs today: CMP.  Return in 4 weeks.      Relevant Medications   verapamil (CALAN) 80 MG tablet   metFORMIN (GLUCOPHAGE) 500 MG tablet   Other Relevant Orders   Bayer DCA Hb A1c Waived (Completed)   Comprehensive metabolic panel   PVD (peripheral vascular disease) (HCC)    Chronic, stable, followed by vascular.  Continue collaboration, recent note reviewed.  Continue statin and ASA daily.      Relevant Medications   verapamil (CALAN) 80 MG tablet   Other Relevant Orders   Comprehensive metabolic panel   Lipid Panel w/o Chol/HDL Ratio   Senile purpura (HCC)    Bilateral upper extremity.  Recommend gentle skin cleansing daily and application of lotion daily.  Monitor skin and notify provider if any abrasions or wounds.      Relevant Medications   verapamil (CALAN) 80 MG tablet   Type 2 diabetes, controlled, with peripheral circulatory disorder (HCC) - Primary    Chronic, ongoing with A1c stable at 6.6% today, previous was 6.4%.  Urine ALB 80 January 2024.  Recommend she continue Olmesartan daily for kidney protection. Continue Metformin 500 MG BID and continue Januvia 100 MG daily, educated her on this.  She did not tolerate GLP1 or SGLT2 in past or increase in Metformin.  Check BS BID.  Monitor diet at home and reduce carbs and foods high in sugar.   Continue to collaborate with vascular. She prefers not to start insulin if ever needed.       Relevant Medications   verapamil (CALAN) 80 MG tablet   metFORMIN (GLUCOPHAGE) 500 MG tablet   Other Relevant Orders   Bayer DCA Hb A1c Waived (Completed)   Comprehensive metabolic panel     Respiratory   Centrilobular emphysema (HCC)    Chronic, ongoing.  Continue Breztri daily and Albuterol as needed.  Recommend complete cessation of smoking.  Continue annual CT scans.  Continue collaboration with pulmonary, which is beneficial.        Relevant Medications   Budeson-Glycopyrrol-Formoterol (BREZTRI AEROSPHERE) 160-9-4.8 MCG/ACT AERO   Chronic respiratory failure with hypoxia and hypercapnia (HCC)    Continue to collaborate with pulmonary.  Tolerating O2 at night well.  Digestive   GERD (gastroesophageal reflux disease)   Hepatic steatosis   Relevant Orders   Comprehensive metabolic panel     Endocrine   Hyperlipidemia associated with type 2 diabetes mellitus (HCC)    Chronic, ongoing.  Highly recommend she continue Crestor and adjust dosing as needed or add on Zetia if poor control.  Lipid check today.      Relevant Medications   verapamil (CALAN) 80 MG tablet   metFORMIN (GLUCOPHAGE) 500 MG tablet   Other Relevant Orders   Bayer DCA Hb A1c Waived (Completed)   Comprehensive metabolic panel   Lipid Panel w/o Chol/HDL Ratio     Nervous and Auditory   Nicotine dependence, cigarettes, w unsp disorders    I have recommended complete cessation of tobacco use. I have discussed various options available for assistance with tobacco cessation including over the counter methods (Nicotine gum, patch and lozenges). We also discussed prescription options (Chantix, Nicotine Inhaler / Nasal Spray). The patient is not interested in pursuing any prescription tobacco cessation options at this time.  Continue yearly lung screening.         Other   Chronic bilateral low back pain     Ongoing since surgery years ago, has had multiple treatments and injections.  At this time will trial switch from Celexa to Duloxetine which may benefit mood and pain more.  Start Lyrica 25 MG at night, did not get benefit from Gabapentin in past.  Educated her on changes and medications + side effects.  Recommend stretching at home daily and movement.  Continue Lidocaine patches.  Return in 4 weeks.      Relevant Medications   DULoxetine (CYMBALTA) 30 MG capsule   pregabalin (LYRICA) 25 MG capsule   Generalized anxiety disorder    Chronic, ongoing. Denies SI/HI.  Will trial change from Celexa to Duloxetine, direct switch, as this may benefit mood more and chronic back pain.  Educated her on this medication and change + side effects and BLACK BOX warning.  Suspect pain a big reason for mood changes.  Return in 4 weeks.      Relevant Medications   DULoxetine (CYMBALTA) 30 MG capsule   Obesity    BMI 32.87.  Recommended eating smaller high protein, low fat meals more frequently and exercising 30 mins a day 5 times a week with a goal of 10-15lb weight loss in the next 3 months. Patient voiced their understanding and motivation to adhere to these recommendations.       Relevant Medications   metFORMIN (GLUCOPHAGE) 500 MG tablet      Follow up plan: Return in about 4 weeks (around 08/30/2022) for MOOD, BACK PAIN, BLOOD PRESSURE -- reduced Verapamil, started Duloxetine and Lyrica.Marland Kitchen

## 2022-08-02 NOTE — Assessment & Plan Note (Signed)
Chronic, stable, followed by vascular.  Continue collaboration, recent note reviewed.  Continue statin and ASA daily.

## 2022-08-02 NOTE — Assessment & Plan Note (Signed)
BMI 32.87.  Recommended eating smaller high protein, low fat meals more frequently and exercising 30 mins a day 5 times a week with a goal of 10-15lb weight loss in the next 3 months. Patient voiced their understanding and motivation to adhere to these recommendations.

## 2022-08-02 NOTE — Assessment & Plan Note (Signed)
I have recommended complete cessation of tobacco use. I have discussed various options available for assistance with tobacco cessation including over the counter methods (Nicotine gum, patch and lozenges). We also discussed prescription options (Chantix, Nicotine Inhaler / Nasal Spray). The patient is not interested in pursuing any prescription tobacco cessation options at this time.  Continue yearly lung screening.  

## 2022-08-02 NOTE — Assessment & Plan Note (Signed)
Chronic, ongoing. Denies SI/HI.  Will trial change from Celexa to Duloxetine, direct switch, as this may benefit mood more and chronic back pain.  Educated her on this medication and change + side effects and BLACK BOX warning.  Suspect pain a big reason for mood changes.  Return in 4 weeks.

## 2022-08-02 NOTE — Assessment & Plan Note (Signed)
Chronic, ongoing.  Continue Breztri daily and Albuterol as needed.  Recommend complete cessation of smoking.  Continue annual CT scans.  Continue collaboration with pulmonary, which is beneficial.   

## 2022-08-02 NOTE — Assessment & Plan Note (Signed)
Chronic, ongoing.  Continue collaboration with vascular.  Recent note reviewed.

## 2022-08-02 NOTE — Assessment & Plan Note (Signed)
Bilateral upper extremity.  Recommend gentle skin cleansing daily and application of lotion daily.  Monitor skin and notify provider if any abrasions or wounds. 

## 2022-08-02 NOTE — Assessment & Plan Note (Signed)
Ongoing since surgery years ago, has had multiple treatments and injections.  At this time will trial switch from Celexa to Duloxetine which may benefit mood and pain more.  Start Lyrica 25 MG at night, did not get benefit from Gabapentin in past.  Educated her on changes and medications + side effects.  Recommend stretching at home daily and movement.  Continue Lidocaine patches.  Return in 4 weeks.

## 2022-08-02 NOTE — Assessment & Plan Note (Signed)
Chronic, ongoing noted on lung CT screening.  Continue statin and ASA daily for prevention.  Continue collaboration with vascular.  Recommend complete cessation smoking. 

## 2022-08-02 NOTE — Assessment & Plan Note (Signed)
Chronic, ongoing.  Highly recommend she continue Crestor and adjust dosing as needed or add on Zetia if poor control.  Lipid check today. 

## 2022-08-02 NOTE — Assessment & Plan Note (Signed)
Continue to collaborate with pulmonary.  Tolerating O2 at night well. 

## 2022-08-02 NOTE — Assessment & Plan Note (Signed)
Per cardiology notes on review.  She is to follow-up with them in 3 months, recommend she call and schedule with them.  At this time continue current medication regimen and recommend: - Reminded to call for an overnight weight gain of >2 pounds or a weekly weight gain of >5 pounds - not adding salt to food and read food labels. Reviewed the importance of keeping daily sodium intake to <2000mg daily. - Avoid NSAIDs 

## 2022-08-02 NOTE — Assessment & Plan Note (Addendum)
Chronic, ongoing with A1c stable at 6.6% today, previous was 6.4%.  Urine ALB 80 January 2024.  Recommend she continue Olmesartan daily for kidney protection. Continue Metformin 500 MG BID and continue Januvia 100 MG daily, educated her on this.  She did not tolerate GLP1 or SGLT2 in past or increase in Metformin.  Check BS BID.  Monitor diet at home and reduce carbs and foods high in sugar.  Continue to collaborate with vascular. She prefers not to start insulin if ever needed.

## 2022-08-02 NOTE — Assessment & Plan Note (Signed)
Chronic, stable.  BP well below goal in office today with more hypotensive levels and dizziness recently.  Continue Olmesartan 5 MG doe kidney protection and reduce Verapamil to 80 MG daily, discuss with patient and support system.  Recommend she monitor BP at least a few mornings a week at home and document.  DASH diet at home.  Labs today: CMP.  Return in 4 weeks.

## 2022-08-03 LAB — COMPREHENSIVE METABOLIC PANEL
ALT: 18 IU/L (ref 0–32)
AST: 22 IU/L (ref 0–40)
Albumin/Globulin Ratio: 2.3 — ABNORMAL HIGH (ref 1.2–2.2)
Albumin: 4.5 g/dL (ref 3.9–4.9)
Alkaline Phosphatase: 80 IU/L (ref 44–121)
BUN/Creatinine Ratio: 16 (ref 12–28)
BUN: 15 mg/dL (ref 8–27)
Bilirubin Total: 0.4 mg/dL (ref 0.0–1.2)
CO2: 21 mmol/L (ref 20–29)
Calcium: 9.4 mg/dL (ref 8.7–10.3)
Chloride: 98 mmol/L (ref 96–106)
Creatinine, Ser: 0.95 mg/dL (ref 0.57–1.00)
Globulin, Total: 2 g/dL (ref 1.5–4.5)
Glucose: 94 mg/dL (ref 70–99)
Potassium: 4.8 mmol/L (ref 3.5–5.2)
Sodium: 137 mmol/L (ref 134–144)
Total Protein: 6.5 g/dL (ref 6.0–8.5)
eGFR: 67 mL/min/{1.73_m2} (ref >59)

## 2022-08-03 LAB — LIPID PANEL W/O CHOL/HDL RATIO
Cholesterol, Total: 121 mg/dL (ref 100–199)
HDL: 49 mg/dL (ref >39)
LDL Chol Calc (NIH): 45 mg/dL (ref 0–99)
Triglycerides: 162 mg/dL — ABNORMAL HIGH (ref 0–149)
VLDL Cholesterol Cal: 27 mg/dL (ref 5–40)

## 2022-08-03 NOTE — Progress Notes (Signed)
Contacted via MyChart   Good afternoon Stacey Gross, your labs have returned: - Kidney function, creatinine and eGFR, remains normal, as is liver function, AST and ALT.  - Cholesterol levels are at goal.  Overall stable labs.  Any questions?  No medication changes needed.:) Keep being amazing!!  Thank you for allowing me to participate in your care.  I appreciate you. Kindest regards, Brailey Buescher

## 2022-08-05 ENCOUNTER — Ambulatory Visit: Payer: Self-pay | Admitting: *Deleted

## 2022-08-05 NOTE — Telephone Encounter (Signed)
  Chief Complaint: Pt called in and was given her lab result message from Aura Dials, NP dated 08/03/2022 at 1:06 PM.   No questions from pt. Symptoms: N/A Frequency: N/A Pertinent Negatives: Patient denies N/A Disposition: [] ED /[] Urgent Care (no appt availability in office) / [] Appointment(In office/virtual)/ []  [facility]/ [x] Home Care/ [] Refused Recommended Disposition /[] [place] [facility]/ []  Follow-up with PCP Additional Notes:   Reason for Disposition  [1] Follow-up call to recent contact AND [2] information only call, no triage required  Answer Assessment - Initial Assessment Questions 1. REASON FOR CALL or QUESTION: "What is your reason for calling today?" or "How can I best help you?" or "What question do you have that I can help answer?"     Pt called in and was given her lab result message from Aura Dials, NP  Protocols used: Information Only Call - No Triage-A-AH

## 2022-08-15 ENCOUNTER — Ambulatory Visit: Payer: 59 | Admitting: Pulmonary Disease

## 2022-08-15 ENCOUNTER — Encounter: Payer: Self-pay | Admitting: Pulmonary Disease

## 2022-08-15 VITALS — BP 122/70 | HR 83 | Temp 97.8°F | Ht 67.0 in | Wt 212.0 lb

## 2022-08-15 DIAGNOSIS — I5032 Chronic diastolic (congestive) heart failure: Secondary | ICD-10-CM

## 2022-08-15 DIAGNOSIS — J449 Chronic obstructive pulmonary disease, unspecified: Secondary | ICD-10-CM

## 2022-08-15 DIAGNOSIS — J4489 Other specified chronic obstructive pulmonary disease: Secondary | ICD-10-CM

## 2022-08-15 DIAGNOSIS — G4736 Sleep related hypoventilation in conditions classified elsewhere: Secondary | ICD-10-CM

## 2022-08-15 DIAGNOSIS — F17210 Nicotine dependence, cigarettes, uncomplicated: Secondary | ICD-10-CM

## 2022-08-15 NOTE — Progress Notes (Unsigned)
Subjective:    Patient ID: Stacey Gross, female    DOB: July 01, 1960, 62 y.o.   MRN: [id number] Patient Care Team: Marjie Skiff, NP as PCP - General (Nurse Practitioner) Debbe Odea, MD as PCP - Cardiology (Cardiology) Steele Sizer, MD (Family Medicine)  Chief Complaint  Patient presents with   Follow-up    SOB with exertion and incline, dry cough at times prod with clear sputum and wheezing.     HPI Is a 62 year old current smoker (1-2 PPD, 96 PY) who presents for follow-up on the issue of COPD and shortness of breath.  Patient has had issues with increasing shortness of breath since 2022.  Her last visit with me was on 06 January 2022 and at that time she was making an effort to quit smoking.  Today she states that she just cannot quit smoking.  She states she has tried "everything" and she has just given up.  She unfortunately continues to smoke anywhere between 1 to 2 packs of cigarettes per day.  She is currently maintained on Breztri 2 puffs twice a day and albuterol as needed.  She is on nocturnal oxygen for nocturnal oxygen desaturations, she is on 2 L/min at nighttime.  She is compliant with the therapy and notes benefit.  She has noted significant diastolic dysfunction but has not followed up with cardiology as requested.  She has not had any fevers, chills or sweats.  There is occasional dry cough and wheezing noted.  No other complaints.  DATA 10/29/2020 PFTs: FEV1 1.42 L or 50% predicted, FVC 2.33 L or 64% predicted, FEV1/FVC 61%.  No bronchodilator response.  Air-trapping and hyperinflation noted, diffusion capacity moderately reduced, consistent with moderate COPD with emphysema.   07/14/2021 LDCT: Lung RADS 3 probably benign findings.  No solid pulmonary nodule in the right lower lobe measuring 4.8 mm.  Short-term follow-up in 6 months recommended.  Moderate emphysema noted. 10/20/2021 echocardiogram: LVEF 50 to 55%, grade 2 DD, moderately increased right  ventricular wall thickness.  This represented a slight decrease in LVEF and increase in diastolic dysfunction from prior. 11/15/2021 overnight oximetry: Oxygen desaturations to as low as 76%.  Oxygen at 2 L/min nocturnally prescribed. 01/21/2022 LDCT: Lung RADS 2, previously noted lung nodules unchanged.  No nonsolid nodule within the apical segment of the right upper lobe at 5.9 mm.  Review of Systems A 10 point review of systems was performed and it is as noted above otherwise negative.  Patient Active Problem List   Diagnosis Date Noted   Chronic bilateral low back pain 08/02/2022   Chronic diastolic heart failure (HCC) 01/30/2022   Esophageal dysphagia    Vitamin D deficiency 04/01/2021   Chronic respiratory failure with hypoxia and hypercapnia (HCC) 08/14/2020   PVD (peripheral vascular disease) (HCC) 07/25/2020   B12 deficiency 01/07/2020   Obesity 10/04/2019   Status post total shoulder arthroplasty, right 07/24/2019   Senile purpura (HCC) 06/17/2019   Hepatic steatosis 06/03/2019   Nummular dermatitis 05/28/2019   Centrilobular emphysema (HCC) 05/14/2019   Generalized anxiety disorder 12/17/2018   Type 2 diabetes, controlled, with peripheral circulatory disorder (HCC) 03/21/2018   Hx of colonic polyps    Ectopic gastric tissue    Aortic atherosclerosis (HCC) 04/18/2017   Advanced care planning/counseling discussion 02/22/2017   Blue toe syndrome (HCC) 03/04/2016   GERD (gastroesophageal reflux disease) 12/30/2014   Hyperlipidemia associated with type 2 diabetes mellitus (HCC)    Hypertension associated with diabetes (HCC)  Degenerative joint disease (DJD) of hip    S/P lumbar fusion 07/02/2013   Nicotine dependence, cigarettes, w unsp disorders 02/23/2010   Social History   Tobacco Use   Smoking status: Every Day    Packs/day: 2.00    Years: 48.00    Additional pack years: 0.00    Total pack years: 96.00    Types: Cigarettes   Smokeless tobacco: Never   Tobacco  comments:    1-2 PPD 08/15/2022  Substance Use Topics   Alcohol use: No   Allergies  Allergen Reactions   Doxycycline Hyclate Anaphylaxis, Swelling and Rash   Acetaminophen    Aspirin    Latex Rash    Gloves, underwear elastic, tape   Vancomycin Rash    All mycins   Current Meds  Medication Sig   Accu-Chek Softclix Lancets lancets USE TO CHECK BLOOD SUGAR DAILY   albuterol (PROVENTIL) (2.5 MG/3ML) 0.083% nebulizer solution USE 1 VIAL (3 ML = 2.5 MG TOTAL) BY NEBULIZATION EVERY 6 HOURS AS NEEDED FORWHEEZING OR SHORTNESS OF BREATH   albuterol (VENTOLIN HFA) 108 (90 Base) MCG/ACT inhaler INHALE 2 PUFFS BY MOUTH INTO THE LUNGS EVERY 6 HOURS AS NEEDED FOR WHEEZING OR SHORTNESS OF BREATH   Blood Glucose Monitoring Suppl (ACCU-CHEK GUIDE ME) w/Device KIT Use to check blood sugars 2-3 times daily with goals = <130 fasting in morning and <180 two hours after eating.  Bring blood sugar log to appointments.   Budeson-Glycopyrrol-Formoterol (BREZTRI AEROSPHERE) 160-9-4.8 MCG/ACT AERO Inhale 2 puffs into the lungs in the morning and at bedtime.   Cholecalciferol 1.25 MG (50000 UT) TABS Take 1 tablet by mouth once a week.   cyanocobalamin (VITAMIN B12) 1000 MCG tablet Take 1 tablet (1,000 mcg total) by mouth daily.   DULoxetine (CYMBALTA) 30 MG capsule Start out taking 30 MG (one tablet) by mouth daily and then in one week may increase to two tablets (60 MG) by mouth daily.   furosemide (LASIX) 20 MG tablet TAKE 1 TABLET EVERY DAY AS NEEDED   glucose blood test strip Use to check blood sugar three times a day.   hydrOXYzine (VISTARIL) 25 MG capsule Take 1 capsule (25 mg total) by mouth every 8 (eight) hours as needed.   lidocaine (LIDODERM) 5 % Place 1 patch onto the skin daily. Remove & Discard patch within 12 hours or as directed by MD   metFORMIN (GLUCOPHAGE) 500 MG tablet Take 1 tablet (500 mg total) by mouth 2 (two) times daily.   olmesartan (BENICAR) 5 MG tablet Take 1 tablet (5 mg total) by  mouth daily.   pantoprazole (PROTONIX) 40 MG tablet Take 1 tablet (40 mg total) by mouth daily.   pregabalin (LYRICA) 25 MG capsule Take 1 capsule (25 mg total) by mouth at bedtime.   prochlorperazine (COMPAZINE) 10 MG tablet Take 10 mg by mouth every 6 (six) hours as needed.   rosuvastatin (CRESTOR) 40 MG tablet Take 1 tablet (40 mg total) by mouth daily.   sitaGLIPtin (JANUVIA) 100 MG tablet Take 1 tablet (100 mg total) by mouth daily.   traZODone (DESYREL) 100 MG tablet TAKE 1 TABLET BY MOUTH AT BEDTIME AS NEEDED FOR SLEEP   verapamil (CALAN) 80 MG tablet Take 1 tablet (80 mg total) by mouth daily.   Immunization History  Administered Date(s) Administered   Influenza,inj,Quad PF,6+ Mos 02/09/2016, 02/22/2017, 05/09/2018, 12/17/2018, 01/07/2020   Pneumococcal Polysaccharide-23 02/09/2016   Td 04/04/2004, 05/13/2014   Zoster Recombinat (Shingrix) 01/07/2019, 04/22/2019  Objective:   Physical Exam BP 122/70 (BP Location: Left Arm, Cuff Size: Large)   Pulse 83   Temp 97.8 F (36.6 C) (Temporal)   Ht 5\' 7"  (1.702 m)   Wt 212 lb (96.2 kg)   SpO2 98%   BMI 33.20 kg/m   SpO2: 98 % O2 Device: None (Room air)  GENERAL: Well-developed, obese, fully ambulatory, no conversational dyspnea. HEAD: Normocephalic, atraumatic.  EYES: Pupils equal, round, reactive to light. No scleral icterus.  MOUTH: Edentulous upper, poor dentition lower. NECK: Supple. No thyromegaly. Trachea midline. No JVD. No adenopathy. PULMONARY: Distant breath sounds bilaterally. Coarse with scattered end expiratory wheezes. CARDIOVASCULAR: S1 and S2. Regular rate and rhythm. No rubs, murmurs or gallops heard. ABDOMEN: Obese otherwise benign MUSCULOSKELETAL: No joint deformity, no clubbing, no edema.  NEUROLOGIC: No overt focal deficit, no gait disturbance, speech is fluent. SKIN: Intact,warm,dry. PSYCH: Mood and behavior normal.   Ambulatory oximetry was performed today: At rest oxygen saturation was 95%  patient ambulated 3 laps with O2 nadir at 94%.  She walked at a moderate pace and complained of moderate shortness of breath.  Completed the test.  Heart rate at rest was 82 bpm, and maximum exercise 105 bpm.    Assessment & Plan:     ICD-10-CM   1. Stage 2 moderate COPD by GOLD classification (HCC)  J44.9 AMB referral to pulmonary rehabilitation    Pulmonary Function Test [facility] Only   Continue Breztri 2 puffs twice a day Continue albuterol as needed STOP SMOKING!    2. 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 Chronic diastolic heart failure (HCC)  Z61.09    Advised needs to make follow-up appointment with cardiology She may be due for follow-up echocardiogram    4. Tobacco dependence due to cigarettes  F17.210    Patient counseled regards discontinuation of smoking Advised therapeutic interventions of little impact with ongoing smoking Counseling time 3 to 5 minutes     Orders Placed This Encounter  Procedures   AMB referral to pulmonary rehabilitation    Referral Priority:   Routine    Referral Type:   Consultation    Number of Visits Requested:   1   Pulmonary Function Test [facility] Only    Standing Status:   Future    Standing Expiration Date:   08/15/2023    Order Specific Question:   Full PFT: includes the following: basic spirometry, spirometry pre & post bronchodilator, diffusion capacity (DLCO), lung volumes    Answer:   Full PFT   Patient is ambulatory oximetry today failed to show any significant oxygen desaturations.  Her dyspnea is likely multifactorial.  There is an element of obesity, deconditioning and diastolic dysfunction adding to her issues.  In addition to ongoing smoking is not helping matters.  This was stressed to her today.  Will refer to pulmonary rehab and see if this will help her with her issues of dyspnea.  We  will reassess pulmonary function with PFTs.  Will see the patient in follow-up in 2 to 3 months time she is to contact us prior to that time should any new difficulties arise.  Gailen Shelter, MD Advanced Bronchoscopy [facility]  [facility]    *This note was dictated using voice recognition software/Dragon.  Despite best efforts to proofread, errors can occur which can change the meaning. Any  transcriptional errors that result from this process are unintentional and may not be fully corrected at the time of dictation.

## 2022-08-15 NOTE — Patient Instructions (Signed)
You really need to make an effort to quit smoking.  We are getting some new breathing tests.  We are referring you to pulmonary rehab.  Please make sure that you make an appointment with the heart specialist.  Continue using your oxygen at nighttime.  Continue using Breztri and as needed albuterol.  We will see you in follow-up in 2 to 3 months time call sooner should any new problems arise.

## 2022-08-29 NOTE — Patient Instructions (Incomplete)

## 2022-08-30 ENCOUNTER — Telehealth: Payer: Self-pay | Admitting: Nurse Practitioner

## 2022-08-30 NOTE — Telephone Encounter (Signed)
Called patient to confirm appointment for Thursday on 09/01/2022, she stated that her and provider have spoke several times about creating a DNR.  Patient wanted to see about doing this prior/during her visit on Thursday.  Please advise.

## 2022-09-01 ENCOUNTER — Ambulatory Visit (INDEPENDENT_AMBULATORY_CARE_PROVIDER_SITE_OTHER): Payer: 59 | Admitting: Nurse Practitioner

## 2022-09-01 ENCOUNTER — Encounter: Payer: Self-pay | Admitting: Nurse Practitioner

## 2022-09-01 VITALS — BP 106/60 | HR 60 | Temp 98.3°F | Ht 67.01 in | Wt 210.8 lb

## 2022-09-01 DIAGNOSIS — E1159 Type 2 diabetes mellitus with other circulatory complications: Secondary | ICD-10-CM | POA: Diagnosis not present

## 2022-09-01 DIAGNOSIS — J449 Chronic obstructive pulmonary disease, unspecified: Secondary | ICD-10-CM | POA: Diagnosis not present

## 2022-09-01 DIAGNOSIS — F411 Generalized anxiety disorder: Secondary | ICD-10-CM

## 2022-09-01 DIAGNOSIS — M545 Low back pain, unspecified: Secondary | ICD-10-CM | POA: Diagnosis not present

## 2022-09-01 DIAGNOSIS — E6609 Other obesity due to excess calories: Secondary | ICD-10-CM | POA: Diagnosis not present

## 2022-09-01 DIAGNOSIS — I152 Hypertension secondary to endocrine disorders: Secondary | ICD-10-CM

## 2022-09-01 DIAGNOSIS — Z7984 Long term (current) use of oral hypoglycemic drugs: Secondary | ICD-10-CM

## 2022-09-01 DIAGNOSIS — G8929 Other chronic pain: Secondary | ICD-10-CM

## 2022-09-01 DIAGNOSIS — Z6832 Body mass index (BMI) 32.0-32.9, adult: Secondary | ICD-10-CM

## 2022-09-01 DIAGNOSIS — J9611 Chronic respiratory failure with hypoxia: Secondary | ICD-10-CM | POA: Diagnosis not present

## 2022-09-01 MED ORDER — PREGABALIN 25 MG PO CAPS: 25.0000 mg | ORAL_CAPSULE | Freq: Two times a day (BID) | ORAL | 4 refills | Status: DC

## 2022-09-01 MED ORDER — DULOXETINE HCL 60 MG PO CPEP: 60.0000 mg | ORAL_CAPSULE | Freq: Every day | ORAL | 4 refills | Status: DC

## 2022-09-01 NOTE — Progress Notes (Signed)
BP 106/60   Pulse 60   Temp 98.3 F (36.8 C) (Oral)   Ht 5' 7.01" (1.702 m)   Wt 210 lb 12.8 oz (95.6 kg)   SpO2 94%   BMI 33.01 kg/m    Subjective:    Patient ID: Stacey Gross, female    DOB: 1961/03/06, 62 y.o.   MRN: [id number]  HPI: Stacey Gross is a 61 y.o. female  Chief Complaint  Patient presents with   Mood   Back Pain   Blood Pressure Check   Support person, friend, at bedside with her.  HYPERTENSION  At visit on 08/02/22 we reduced her Verapamil to 80 MG.  Dizziness has improved with medication reduction.  Continues on Olmesartan 5 MG daily. Hypertension status: stable  Satisfied with current treatment? yes Duration of hypertension: chronic BP monitoring frequency:  daily BP range: <130/80 BP medication side effects:  no Medication compliance: good compliance Aspirin: no Recurrent headaches: no Visual changes: no Palpitations: no Dyspnea: no Chest pain: no Lower extremity edema: no Dizzy/lightheaded: no   DEPRESSION At visit on 08/02/22 we changed Celexa to Duloxetine and started low dose of Lyrica for back discomfort -- Gabapentin had not offered benefit.  Her support system and her report mood improved, but pain continues although tolerable. Her support person reports patient has even gone out shopping with her. Mood status: stable Satisfied with current treatment?: yes Symptom severity: mild  Duration of current treatment : chronic Side effects: no Medication compliance: good compliance Psychotherapy/counseling: none Previous psychiatric medications: Celexa Depressed mood: no Anxious mood: yes Anhedonia: no Significant weight loss or gain: no Insomnia: yes hard to stay asleep Fatigue: no Feelings of worthlessness or guilt: no Impaired concentration/indecisiveness: no Suicidal ideations: no Hopelessness: no Crying spells: no    09/01/2022    9:51 AM 08/02/2022    9:44 AM 05/23/2022   11:06 AM 05/04/2022    8:53 AM 01/31/2022    2:44  PM  Depression screen PHQ 2/9  Decreased Interest 0 2 0 0 0  Down, Depressed, Hopeless 0 2 1 0 0  PHQ - 2 Score 0 4 1 0 0  Altered sleeping 2 2 0 0 0  Tired, decreased energy 1 2 0 0 0  Change in appetite 1 0 0 0 0  Feeling bad or failure about yourself  0 0 0 0 0  Trouble concentrating 0 0 0 0 0  Moving slowly or fidgety/restless 0 0 0 0 0  Suicidal thoughts 0 0 0 0 0  PHQ-9 Score 4 8 1  0 0  Difficult doing work/chores Not difficult at all Somewhat difficult Not difficult at all Not difficult at all Not difficult at all       09/01/2022    9:52 AM 08/02/2022    9:45 AM 05/04/2022    8:54 AM 01/31/2022    2:45 PM  GAD 7 : Generalized Anxiety Score  Nervous, Anxious, on Edge 0 2 0 0  Control/stop worrying 0 2 0 0  Worry too much - different things 1 2 0 0  Trouble relaxing 0 0 0 0  Restless 0 0 0 0  Easily annoyed or irritable 2 2 0 0  Afraid - awful might happen 0 2 0 0  Total GAD 7 Score 3 10 0 0  Anxiety Difficulty Not difficult at all Somewhat difficult Not difficult at all Not difficult at all      Relevant past medical, surgical, family and social history  reviewed and updated as indicated. Interim medical history since our last visit reviewed. Allergies and medications reviewed and updated.  Review of Systems  Constitutional:  Negative for activity change, appetite change, diaphoresis, fatigue and fever.  Respiratory:  Positive for cough (baseline). Negative for chest tightness, shortness of breath and wheezing.   Cardiovascular:  Negative for chest pain, palpitations and leg swelling.  Gastrointestinal: Negative.   Musculoskeletal:  Positive for back pain.  Neurological: Negative.   Psychiatric/Behavioral: Negative.  Negative for sleep disturbance and suicidal ideas.     Per HPI unless specifically indicated above     Objective:    BP 106/60   Pulse 60   Temp 98.3 F (36.8 C) (Oral)   Ht 5' 7.01" (1.702 m)   Wt 210 lb 12.8 oz (95.6 kg)   SpO2 94%   BMI  33.01 kg/m   Wt Readings from Last 3 Encounters:  09/01/22 210 lb 12.8 oz (95.6 kg)  08/15/22 212 lb (96.2 kg)  08/02/22 209 lb 14.4 oz (95.2 kg)    Physical Exam Vitals and nursing note reviewed.  Constitutional:      General: She is awake.     Appearance: She is well-developed and well-groomed. She is obese.  HENT:     Head: Normocephalic.     Right Ear: Hearing and external ear normal.     Left Ear: Hearing and external ear normal.  Eyes:     General: Lids are normal.        Right eye: No discharge.        Left eye: No discharge.     Conjunctiva/sclera: Conjunctivae normal.     Pupils: Pupils are equal, round, and reactive to light.  Neck:     Thyroid: No thyromegaly.     Vascular: No carotid bruit.  Cardiovascular:     Rate and Rhythm: Normal rate and regular rhythm.     Heart sounds: Normal heart sounds. No murmur heard.    No gallop.  Pulmonary:     Effort: Pulmonary effort is normal. No accessory muscle usage or respiratory distress.     Breath sounds: Wheezing present.     Comments: Scattered expiratory wheezes throughout noted - baseline. Abdominal:     General: Bowel sounds are normal.     Palpations: Abdomen is soft.  Musculoskeletal:     Cervical back: Normal range of motion and neck supple.     Right lower leg: No edema.     Left lower leg: No edema.  Lymphadenopathy:     Cervical: No cervical adenopathy.  Skin:    General: Skin is warm and dry.     Comments: Scattered pale purple bruising bilateral upper extremities.  Neurological:     Mental Status: She is alert and oriented to person, place, and time.     Cranial Nerves: Cranial nerves 2-12 are intact.     Gait: Gait is intact.     Deep Tendon Reflexes: Reflexes are normal and symmetric.     Reflex Scores:      Brachioradialis reflexes are 2+ on the right side and 2+ on the left side.      Patellar reflexes are 2+ on the right side and 2+ on the left side. Psychiatric:        Attention and  Perception: Attention normal.        Mood and Affect: Mood normal.        Speech: Speech normal.  Behavior: Behavior normal. Behavior is cooperative.        Thought Content: Thought content normal.     Results for orders placed or performed in visit on 08/02/22  Bayer DCA Hb A1c Waived  Result Value Ref Range   HB A1C (BAYER DCA - WAIVED) 6.6 (H) 4.8 - 5.6 %  Comprehensive metabolic panel  Result Value Ref Range   Glucose 94 70 - 99 mg/dL   BUN 15 8 - 27 mg/dL   Creatinine, Ser 1.61 0.57 - 1.00 mg/dL   eGFR 67 >09 UE/AVW/0.98   BUN/Creatinine Ratio 16 12 - 28   Sodium 137 134 - 144 mmol/L   Potassium 4.8 3.5 - 5.2 mmol/L   Chloride 98 96 - 106 mmol/L   CO2 21 20 - 29 mmol/L   Calcium 9.4 8.7 - 10.3 mg/dL   Total Protein 6.5 6.0 - 8.5 g/dL   Albumin 4.5 3.9 - 4.9 g/dL   Globulin, Total 2.0 1.5 - 4.5 g/dL   Albumin/Globulin Ratio 2.3 (H) 1.2 - 2.2   Bilirubin Total 0.4 0.0 - 1.2 mg/dL   Alkaline Phosphatase 80 44 - 121 IU/L   AST 22 0 - 40 IU/L   ALT 18 0 - 32 IU/L  Lipid Panel w/o Chol/HDL Ratio  Result Value Ref Range   Cholesterol, Total 121 100 - 199 mg/dL   Triglycerides 119 (H) 0 - 149 mg/dL   HDL 49 >14 mg/dL   VLDL Cholesterol Cal 27 5 - 40 mg/dL   LDL Chol Calc (NIH) 45 0 - 99 mg/dL      Assessment & Plan:   Problem List Items Addressed This Visit       Cardiovascular and Mediastinum   Hypertension associated with diabetes (HCC) - Primary    Chronic, stable.  BP well below goal in office today and dizziness improved.  Continue Olmesartan 5 MG daily for kidney protection and Verapamil 80 MG daily, discussed with patient and support system.  Recommend she monitor BP at least a few mornings a week at home and document.  DASH diet at home.  Labs today: up to date.  Return in 2 months.        Other   Chronic bilateral low back pain    Ongoing since surgery years ago, has had multiple treatments and injections.  At this time will continue Duloxetine which  is offering benefit to mood and pain.  Increase Lyrica to 25 MG BID since is tolerating this medication, did not get benefit from Gabapentin in past.  Educated her on changes and medications + side effects.  Recommend stretching at home daily and movement.  Continue Lidocaine patches.  Return in 2 months.      Relevant Medications   DULoxetine (CYMBALTA) 60 MG capsule   pregabalin (LYRICA) 25 MG capsule   Generalized anxiety disorder    Chronic, ongoing. Denies SI/HI.  Will continue Duloxetine with increase to 60 MG since is offering benefit to mood and chronic back pain.  Educated her on this medication and change + side effects and BLACK BOX warning.  Suspect pain a big reason for mood changes.  Return in 2 months.      Relevant Medications   DULoxetine (CYMBALTA) 60 MG capsule   Obesity    BMI 33.01.  Recommended eating smaller high protein, low fat meals more frequently and exercising 30 mins a day 5 times a week with a goal of 10-15lb weight loss in the next 3 months.  Patient voiced their understanding and motivation to adhere to these recommendations.         Follow up plan: Return in about 2 months (around 11/02/2022) for T2DM, HTN/HLD, HF, COPD, MOOD, CHRONIC PAIN.

## 2022-09-02 DIAGNOSIS — J9611 Chronic respiratory failure with hypoxia: Secondary | ICD-10-CM | POA: Diagnosis not present

## 2022-09-02 DIAGNOSIS — J449 Chronic obstructive pulmonary disease, unspecified: Secondary | ICD-10-CM | POA: Diagnosis not present

## 2022-09-02 NOTE — Assessment & Plan Note (Signed)
Chronic, ongoing. Denies SI/HI.  Will continue Duloxetine with increase to 60 MG since is offering benefit to mood and chronic back pain.  Educated her on this medication and change + side effects and BLACK BOX warning.  Suspect pain a big reason for mood changes.  Return in 2 months.

## 2022-09-02 NOTE — Assessment & Plan Note (Signed)
Chronic, stable.  BP well below goal in office today and dizziness improved.  Continue Olmesartan 5 MG daily for kidney protection and Verapamil 80 MG daily, discussed with patient and support system.  Recommend she monitor BP at least a few mornings a week at home and document.  DASH diet at home.  Labs today: up to date.  Return in 2 months.

## 2022-09-02 NOTE — Assessment & Plan Note (Signed)
Ongoing since surgery years ago, has had multiple treatments and injections.  At this time will continue Duloxetine which is offering benefit to mood and pain.  Increase Lyrica to 25 MG BID since is tolerating this medication, did not get benefit from Gabapentin in past.  Educated her on changes and medications + side effects.  Recommend stretching at home daily and movement.  Continue Lidocaine patches.  Return in 2 months.

## 2022-09-02 NOTE — Assessment & Plan Note (Signed)
BMI 33.01.  Recommended eating smaller high protein, low fat meals more frequently and exercising 30 mins a day 5 times a week with a goal of 10-15lb weight loss in the next 3 months. Patient voiced their understanding and motivation to adhere to these recommendations.

## 2022-09-29 ENCOUNTER — Telehealth: Payer: Self-pay | Admitting: Nurse Practitioner

## 2022-09-29 NOTE — Telephone Encounter (Signed)
Copied from CRM [id number]. Topic: General - Other >> Sep 28, 2022  2:04 PM Everette C wrote: Reason for CRM: The patient is interested in having an order for a rollator walker   Please contact the patient further if needed

## 2022-09-30 NOTE — Telephone Encounter (Signed)
Patient would need an appointment correct? Medicare would need an office visit for documentation correct?

## 2022-10-02 DIAGNOSIS — J9611 Chronic respiratory failure with hypoxia: Secondary | ICD-10-CM | POA: Diagnosis not present

## 2022-10-02 DIAGNOSIS — J449 Chronic obstructive pulmonary disease, unspecified: Secondary | ICD-10-CM | POA: Diagnosis not present

## 2022-10-03 NOTE — Telephone Encounter (Signed)
Called and scheduled appointment on 10/18/2022 @ 11:20 am.

## 2022-10-04 ENCOUNTER — Other Ambulatory Visit: Payer: Self-pay | Admitting: Nurse Practitioner

## 2022-10-05 ENCOUNTER — Encounter: Payer: Self-pay | Admitting: Nurse Practitioner

## 2022-10-05 ENCOUNTER — Ambulatory Visit (INDEPENDENT_AMBULATORY_CARE_PROVIDER_SITE_OTHER): Payer: 59 | Admitting: Nurse Practitioner

## 2022-10-05 VITALS — BP 106/52 | HR 72 | Temp 97.9°F | Ht 67.01 in | Wt 213.8 lb

## 2022-10-05 DIAGNOSIS — G8929 Other chronic pain: Secondary | ICD-10-CM | POA: Diagnosis not present

## 2022-10-05 DIAGNOSIS — M545 Low back pain, unspecified: Secondary | ICD-10-CM | POA: Diagnosis not present

## 2022-10-05 DIAGNOSIS — H6502 Acute serous otitis media, left ear: Secondary | ICD-10-CM

## 2022-10-05 MED ORDER — AMOXICILLIN-POT CLAVULANATE 875-125 MG PO TABS: 1.0000 | ORAL_TABLET | Freq: Two times a day (BID) | ORAL | 0 refills | Status: AC

## 2022-10-05 NOTE — Patient Instructions (Signed)
Chronic Back Pain Chronic back pain is back pain that lasts longer than 3 months. The pain may get worse at certain times (flare-ups). There are things you can do at home to manage your pain. Follow these instructions at home: Watch for any changes in your symptoms. Take these actions to help with your pain: Managing pain and stiffness     If told, put ice on the painful area. You may be told to use ice for 24-48 hours after a flare-up starts. Put ice in a plastic bag. Place a towel between your skin and the bag. Leave the ice on for 20 minutes, 2-3 times a day. If told, put heat on the painful area. Do this as often as told by your doctor. Use the heat source that your doctor recommends, such as a moist heat pack or a heating pad. Place a towel between your skin and the heat source. Leave the heat on for 20-30 minutes. If your skin turns bright red, take off the ice or heat right away to prevent skin damage. The risk of damage is higher if you cannot feel pain, heat, or cold. Soak in a warm bath. This can help with pain. Activity        Avoid bending and other activities that make the pain worse. When you stand: Keep your upper back and neck straight. Keep your shoulders pulled back. Avoid slouching. When you sit: Keep your back straight. Relax your shoulders. Do not round your shoulders or pull them backward. Do not sit or stand in one place for too long. Take short rest breaks during the day. Lying down or standing is often better than sitting. Resting can help relieve pain. When sitting or lying down for a long time, do some mild activity or stretching. This will help to prevent stiffness and pain. Get regular exercise. Ask your doctor what activities are safe for you. You may have to avoid lifting. Ask your provider how much you can safely lift. If you lift things: Bend your knees. Keep the weight close to your body. Avoid twisting. Medicines Take over-the-counter and  prescription medicines only as told by your doctor. You may need to take medicines for pain and swelling. These may be taken by mouth or put on the skin. You may also be given muscle relaxants. Ask your doctor if the medicine prescribed to you: Requires you to avoid driving or using machinery. Can cause trouble pooping (constipation). You may need to take these actions to prevent or treat trouble pooping: Drink enough fluid to keep your pee (urine) pale yellow. Take over-the-counter or prescription medicines. Eat foods that are high in fiber. These include beans, whole grains, and fresh fruits and vegetables. Limit foods that are high in fat and sugars. These include fried or sweet foods. General instructions  Sleep on a firm mattress. Try lying on your side with your knees slightly bent. If you lie on your back, put a pillow under your knees. Do not smoke or use any products that contain nicotine or tobacco. If you need help quitting, ask your doctor. Contact a doctor if: Your pain does not get better with rest or medicine. You have new pain. You have a fever. You lose weight quickly. You have trouble doing your normal activities. One or both of your legs or feet feel weak. One or both of your legs or feet lose feeling (have numbness). Get help right away if: You are not able to control when you pee   or poop. You have bad back pain and: You feel like you may vomit (nauseous). You vomit. You have pain in your chest or your belly (abdomen). You have shortness of breath. You faint. These symptoms may be an emergency. Get help right away. Call 911. Do not wait to see if the symptoms will go away. Do not drive yourself to the hospital. This information is not intended to replace advice given to you by your health care provider. Make sure you discuss any questions you have with your health care provider. Document Revised: 11/08/2021 Document Reviewed: 11/08/2021 Elsevier Patient Education   2024 Elsevier Inc.  

## 2022-10-05 NOTE — Assessment & Plan Note (Signed)
Ongoing since surgery years ago, has had multiple treatments and injections.  At this time will continue Duloxetine which is offering benefit to mood and pain.  Continue Lyrica 25 MG BID since is tolerating this medication, did not get benefit from Gabapentin in past.  Educated her on changes and medications + side effects.  Recommend stretching at home daily and movement.  Continue Lidocaine patches.  Order for rolling walker with seat to assist with mobility and fall prevention.

## 2022-10-05 NOTE — Progress Notes (Signed)
BP (!) 106/52   Pulse 72   Temp 97.9 F (36.6 C) (Oral)   Ht 5' 7.01" (1.702 m)   Wt 213 lb 12.8 oz (97 kg)   SpO2 97%   BMI 33.48 kg/m    Subjective:    Patient ID: Stacey Gross, female    DOB: [birth year], 62 y.o.   MRN: [id number]  HPI: Stacey Gross is a 62 y.o. female  Chief Complaint  Patient presents with   DME    Patient wishes to speak about getting a walker   MOBILITY ISSUES: History of chronic lower back pain which is worsening at this time and affecting mobility.  Has had some falls recently.  Has had difficulty with mobility for several months due to function bilateral lower extremities and back.  Patient can transfer independently.  A walker with seat would offer benefit, as she enjoys being active to her highest level and this would increase her current independence.  She would be dependent on walker to assist with ADLs.  She has control of upper body and is able to maneuver a walker + cognitively understands how to safely use the walker.  Without a walker her MRADLs would be significantly affected, as she would be unable to transfer to areas in her home to cook and bath + would not be able to attend provider appointments to assess her overall health.     EAR PAIN Has been ongoing for a month to left ear with drainage. Duration: months Involved ear(s): left Severity:  mild  Quality:  dull, aching, tender, tearing, and throbbing Fever: no Otorrhea: yes Upper respiratory infection symptoms: no Pruritus: no Hearing loss: no Water immersion no Using Q-tips: yes Recurrent otitis media: no Status: stable Treatments attempted: none  Relevant past medical, surgical, family and social history reviewed and updated as indicated. Interim medical history since our last visit reviewed. Allergies and medications reviewed and updated.  Review of Systems  Constitutional:  Negative for activity change, appetite change, diaphoresis, fatigue and fever.  HENT:  Positive  for ear discharge and ear pain.   Respiratory:  Positive for cough (baseline). Negative for chest tightness, shortness of breath and wheezing.   Cardiovascular:  Negative for chest pain, palpitations and leg swelling.  Gastrointestinal: Negative.   Musculoskeletal:  Positive for back pain.  Neurological: Negative.   Psychiatric/Behavioral: Negative.  Negative for sleep disturbance and suicidal ideas.     Per HPI unless specifically indicated above     Objective:    BP (!) 106/52   Pulse 72   Temp 97.9 F (36.6 C) (Oral)   Ht 5' 7.01" (1.702 m)   Wt 213 lb 12.8 oz (97 kg)   SpO2 97%   BMI 33.48 kg/m   Wt Readings from Last 3 Encounters:  10/05/22 213 lb 12.8 oz (97 kg)  09/01/22 210 lb 12.8 oz (95.6 kg)  08/15/22 212 lb (96.2 kg)    Physical Exam Vitals and nursing note reviewed.  Constitutional:      General: She is awake.     Appearance: She is well-developed and well-groomed. She is obese.  HENT:     Head: Normocephalic.     Right Ear: Hearing, tympanic membrane, ear canal and external ear normal. No middle ear effusion. There is no impacted cerumen.     Left Ear: Hearing, ear canal and external ear normal. Drainage present. A middle ear effusion is present. There is no impacted cerumen. Tympanic membrane is injected. Tympanic  membrane is not perforated.  Eyes:     General: Lids are normal.        Right eye: No discharge.        Left eye: No discharge.     Conjunctiva/sclera: Conjunctivae normal.     Pupils: Pupils are equal, round, and reactive to light.  Neck:     Thyroid: No thyromegaly.     Vascular: No carotid bruit.  Cardiovascular:     Rate and Rhythm: Normal rate and regular rhythm.     Heart sounds: Normal heart sounds. No murmur heard.    No gallop.  Pulmonary:     Effort: Pulmonary effort is normal. No accessory muscle usage or respiratory distress.     Breath sounds: Wheezing present.     Comments: Scattered expiratory wheezes throughout noted -  baseline. Abdominal:     General: Bowel sounds are normal.     Palpations: Abdomen is soft.  Musculoskeletal:     Cervical back: Normal range of motion and neck supple.     Lumbar back: Tenderness present. No swelling, spasms or bony tenderness. Decreased range of motion. Negative right straight leg raise test and negative left straight leg raise test. No scoliosis.     Right lower leg: No edema.     Left lower leg: No edema.     Comments: Antalgic gait.  Lymphadenopathy:     Cervical: No cervical adenopathy.  Skin:    General: Skin is warm and dry.     Comments: Scattered pale purple bruising bilateral upper extremities.  Neurological:     Mental Status: She is alert and oriented to person, place, and time.     Cranial Nerves: Cranial nerves 2-12 are intact.     Gait: Gait is intact.     Deep Tendon Reflexes: Reflexes are normal and symmetric.     Reflex Scores:      Brachioradialis reflexes are 2+ on the right side and 2+ on the left side.      Patellar reflexes are 2+ on the right side and 2+ on the left side. Psychiatric:        Attention and Perception: Attention normal.        Mood and Affect: Mood normal.        Speech: Speech normal.        Behavior: Behavior normal. Behavior is cooperative.        Thought Content: Thought content normal.     Results for orders placed or performed in visit on 08/02/22  Bayer DCA Hb A1c Waived  Result Value Ref Range   HB A1C (BAYER DCA - WAIVED) 6.6 (H) 4.8 - 5.6 %  Comprehensive metabolic panel  Result Value Ref Range   Glucose 94 70 - 99 mg/dL   BUN 15 8 - 27 mg/dL   Creatinine, Ser 1.61 0.57 - 1.00 mg/dL   eGFR 67 >09 UE/AVW/0.98   BUN/Creatinine Ratio 16 12 - 28   Sodium 137 134 - 144 mmol/L   Potassium 4.8 3.5 - 5.2 mmol/L   Chloride 98 96 - 106 mmol/L   CO2 21 20 - 29 mmol/L   Calcium 9.4 8.7 - 10.3 mg/dL   Total Protein 6.5 6.0 - 8.5 g/dL   Albumin 4.5 3.9 - 4.9 g/dL   Globulin, Total 2.0 1.5 - 4.5 g/dL    Albumin/Globulin Ratio 2.3 (H) 1.2 - 2.2   Bilirubin Total 0.4 0.0 - 1.2 mg/dL   Alkaline Phosphatase 80 44 - 121  IU/L   AST 22 0 - 40 IU/L   ALT 18 0 - 32 IU/L  Lipid Panel w/o Chol/HDL Ratio  Result Value Ref Range   Cholesterol, Total 121 100 - 199 mg/dL   Triglycerides 161 (H) 0 - 149 mg/dL   HDL 49 >09 mg/dL   VLDL Cholesterol Cal 27 5 - 40 mg/dL   LDL Chol Calc (NIH) 45 0 - 99 mg/dL      Assessment & Plan:   Problem List Items Addressed This Visit       Nervous and Auditory   Otitis media    Acute to left ear.  Will start Augmentin BID for 7 days.  Discussed this with patient.  TM intact.  Some drainage noted in canal.  Avoid Q-tips.  To return to office if ongoing or worsening.  Recommend completed cessation of smoking.      Relevant Medications   amoxicillin-clavulanate (AUGMENTIN) 875-125 MG tablet     Other   Chronic bilateral low back pain - Primary    Ongoing since surgery years ago, has had multiple treatments and injections.  At this time will continue Duloxetine which is offering benefit to mood and pain.  Continue Lyrica 25 MG BID since is tolerating this medication, did not get benefit from Gabapentin in past.  Educated her on changes and medications + side effects.  Recommend stretching at home daily and movement.  Continue Lidocaine patches.  Order for rolling walker with seat to assist with mobility and fall prevention.      Relevant Orders   For home use only DME 4 wheeled rolling walker with seat ([id number])     Follow up plan: Return for as scheduled upcoming.

## 2022-10-05 NOTE — Assessment & Plan Note (Signed)
Acute to left ear.  Will start Augmentin BID for 7 days.  Discussed this with patient.  TM intact.  Some drainage noted in canal.  Avoid Q-tips.  To return to office if ongoing or worsening.  Recommend completed cessation of smoking.

## 2022-10-05 NOTE — Telephone Encounter (Signed)
Requested Prescriptions  Pending Prescriptions Disp Refills   verapamil (CALAN) 80 MG tablet [Pharmacy Med Name: VERAPAMIL HCL 80 MG TAB] 90 tablet 0    Sig: TAKE 1 TABLET BY MOUTH DAILY     Cardiovascular: Calcium Channel Blockers 3 Passed - 10/04/2022  2:15 PM      Passed - ALT in normal range and within 360 days    ALT  Date Value Ref Range Status  08/02/2022 18 0 - 32 IU/L Final   ALT (SGPT) Piccolo, Waived  Date Value Ref Range Status  02/22/2017 19 10 - 47 U/L Final         Passed - AST in normal range and within 360 days    AST  Date Value Ref Range Status  08/02/2022 22 0 - 40 IU/L Final   AST (SGOT) Piccolo, Waived  Date Value Ref Range Status  02/22/2017 32 11 - 38 U/L Final         Passed - Cr in normal range and within 360 days    Creat  Date Value Ref Range Status  07/02/2013 0.79 0.50 - 1.10 mg/dL Final   Creatinine, Ser  Date Value Ref Range Status  08/02/2022 0.95 0.57 - 1.00 mg/dL Final         Passed - Last BP in normal range    BP Readings from Last 1 Encounters:  10/05/22 (!) 106/52         Passed - Last Heart Rate in normal range    Pulse Readings from Last 1 Encounters:  10/05/22 72         Passed - Valid encounter within last 6 months    Recent Outpatient Visits           Today Chronic bilateral low back pain without sciatica   [place] [facility] [place], Corrie Dandy T, NP   1 month ago Hypertension associated with diabetes (HCC)   [place] [facility] [place], [place] T, NP   2 months ago Type 2 diabetes, controlled, with peripheral circulatory disorder (HCC)   [place] [facility] [place], [place] T, NP   5 months ago Type 2 diabetes, controlled, with peripheral circulatory disorder (HCC)   [place] [facility] [place], Jolene T, NP   7 months ago Left lower quadrant abdominal pain   [place] [facility] [place], Oswaldo Conroy, PA-C       Future Appointments              In 3 weeks Cannady, Dorie Rank, NP  [facility], [facility]

## 2022-10-07 DIAGNOSIS — M545 Low back pain, unspecified: Secondary | ICD-10-CM | POA: Diagnosis not present

## 2022-10-18 ENCOUNTER — Ambulatory Visit: Payer: 59 | Admitting: Nurse Practitioner

## 2022-10-24 ENCOUNTER — Other Ambulatory Visit: Payer: Self-pay | Admitting: Nurse Practitioner

## 2022-10-25 ENCOUNTER — Ambulatory Visit: Payer: 59 | Admitting: Pulmonary Disease

## 2022-10-25 NOTE — Telephone Encounter (Signed)
Celexa was discontinued 08/02/22.  Requested Prescriptions  Pending Prescriptions Disp Refills   traZODone (DESYREL) 100 MG tablet [Pharmacy Med Name: TRAZODONE HCL 100 MG TAB] 90 tablet 0    Sig: TAKE 1 TABLET BY MOUTH AT BEDTIME AS NEEDED FOR SLEEP     Psychiatry: Antidepressants - Serotonin Modulator Passed - 10/24/2022  2:52 PM      Passed - Valid encounter within last 6 months    Recent Outpatient Visits           2 weeks ago Chronic bilateral low back pain without sciatica   [place] [facility] [place], Corrie Dandy T, NP   1 month ago Hypertension associated with diabetes (HCC)   [place] [facility] [place], Corrie Dandy T, NP   2 months ago Type 2 diabetes, controlled, with peripheral circulatory disorder (HCC)   [place] [facility] [place], Jolene T, NP   5 months ago Type 2 diabetes, controlled, with peripheral circulatory disorder (HCC)   [place] [facility] [place], Jolene T, NP   8 months ago Left lower quadrant abdominal pain   [place] [facility] [place]um, Oswaldo Conroy, PA-C       Future Appointments             In 1 week Cannady, Dorie Rank, NP [place] [facility], [facility]             sitaGLIPtin (JANUVIA) 100 MG tablet [Pharmacy Med Name: JANUVIA 100 MG TAB] 90 tablet 0    Sig: TAKE 1 TABLET BY MOUTH DAILY     Endocrinology:  Diabetes - DPP-4 Inhibitors Passed - 10/24/2022  2:52 PM      Passed - HBA1C is between 0 and 7.9 and within 180 days    HB A1C (BAYER DCA - WAIVED)  Date Value Ref Range Status  08/02/2022 6.6 (H) 4.8 - 5.6 % Final    Comment:             Prediabetes: 5.7 - 6.4          Diabetes: >6.4          Glycemic control for adults with diabetes: <7.0          Passed - Cr in normal range and within 360 days    Creat  Date Value Ref Range Status  07/02/2013 0.79 0.50 - 1.10 mg/dL Final   Creatinine, Ser  Date Value Ref Range Status  08/02/2022 0.95 0.57 - 1.00  mg/dL Final         Passed - Valid encounter within last 6 months    Recent Outpatient Visits           2 weeks ago Chronic bilateral low back pain without sciatica   [place] [facility] [place], Corrie Dandy T, NP   1 month ago Hypertension associated with diabetes (HCC)   [place] [facility] [place], Ericson T, NP   2 months ago Type 2 diabetes, controlled, with peripheral circulatory disorder (HCC)   [place] [facility] [place], [place] T, NP   5 months ago Type 2 diabetes, controlled, with peripheral circulatory disorder (HCC)    [place] [place], [place] T, NP   8 months ago Left lower quadrant abdominal pain    [facility] [place], Oswaldo Conroy, PA-C       Future Appointments             In 1 week  Marjie Skiff, NP [place] [facility], [facility]             citalopram (CELEXA) 20 MG tablet [Pharmacy Med Name: CITALOPRAM HYDROBROMIDE 20 MG TAB] 90 tablet 4    Sig: TAKE 1 TABLET BY MOUTH DAILY     Psychiatry:  Antidepressants - SSRI Passed - 10/24/2022  2:52 PM      Passed - Valid encounter within last 6 months    Recent Outpatient Visits           2 weeks ago Chronic bilateral low back pain without sciatica   [place] [facility] [place], Corrie Dandy T, NP   1 month ago Hypertension associated with diabetes (HCC)   [place] [facility] [place], Corrie Dandy T, NP   2 months ago Type 2 diabetes, controlled, with peripheral circulatory disorder (HCC)   [place] [facility] [place], Jolene T, NP   5 months ago Type 2 diabetes, controlled, with peripheral circulatory disorder (HCC)   [place] [facility] [place], Jolene T, NP   8 months ago Left lower quadrant abdominal pain   [place] [facility] [place], Oswaldo Conroy, PA-C       Future Appointments             In 1 week Cannady, Dorie Rank, NP [facility], [facility]             olmesartan (BENICAR) 5 MG tablet [Pharmacy Med Name: OLMESARTAN MEDOXOMIL 5 MG TAB] 90 tablet 0    Sig: TAKE 1 TABLET BY MOUTH DAILY     Cardiovascular:  Angiotensin Receptor Blockers Passed - 10/24/2022  2:52 PM      Passed - Cr in normal range and within 180 days    Creat  Date Value Ref Range Status  07/02/2013 0.79 0.50 - 1.10 mg/dL Final   Creatinine, Ser  Date Value Ref Range Status  08/02/2022 0.95 0.57 - 1.00 mg/dL Final         Passed - K in normal range and within 180 days    Potassium  Date Value Ref Range Status  08/02/2022 4.8 3.5 - 5.2 mmol/L Final         Passed - Patient is not pregnant      Passed - Last BP in normal range    BP Readings from Last 1 Encounters:  10/05/22 (!) 106/52         Passed - Valid encounter within last 6 months    Recent Outpatient Visits           2 weeks ago Chronic bilateral low back pain without sciatica   [place] [facility] [place], Corrie Dandy T, NP   1 month ago Hypertension associated with diabetes (HCC)   [place] [facility] [place], [place] T, NP   2 months ago Type 2 diabetes, controlled, with peripheral circulatory disorder (HCC)   [place] [facility] [place], Jolene T, NP   5 months ago Type 2 diabetes, controlled, with peripheral circulatory disorder (HCC)   [place] [facility] [place], Jolene T, NP   8 months ago Left lower quadrant abdominal pain   [place] [facility] [place], Oswaldo Conroy, PA-C       Future Appointments             In 1 week Cannady, Dorie Rank, NP [place] [facility], [facility]

## 2022-10-30 NOTE — Patient Instructions (Incomplete)
Cardiology ==  [phone]  Be Involved in Caring For Your Health:  Taking Medications When medications are taken as directed, they can greatly improve your health. But if they are not taken as prescribed, they may not work. In some cases, not taking them correctly can be harmful. To help ensure your treatment remains effective and safe, understand your medications and how to take them. Bring your medications to each visit for review by your provider.  Your lab results, notes, and after visit summary will be available on My Chart. We strongly encourage you to use this feature. If lab results are abnormal the clinic will contact you with the appropriate steps. If the clinic does not contact you assume the results are satisfactory. You can always view your results on My Chart. If you have questions regarding your health or results, please contact the clinic during office hours. You can also ask questions on My Chart.  We at [facility] are grateful that you chose [place] to provide your care. We strive to provide evidence-based and compassionate care and are always looking for feedback. If you get a survey from the clinic please complete this so we can hear your opinions.  Diabetes Mellitus Basics  Diabetes mellitus, or diabetes, is a long-term (chronic) disease. It occurs when the body does not properly use sugar (glucose) that is released from food after you eat. Diabetes mellitus may be caused by one or both of these problems: Your pancreas does not make enough of a hormone called insulin. Your body does not react in a normal way to the insulin that it makes. Insulin lets glucose enter cells in your body. This gives you energy. If you have diabetes, glucose cannot get into cells. This causes high blood glucose (hyperglycemia). How to treat and manage diabetes You may need to take insulin or other diabetes medicines daily to keep your glucose in balance. If you are prescribed insulin,  you will learn how to give yourself insulin by injection. You may need to adjust the amount of insulin you take based on the foods that you eat. You will need to check your blood glucose levels using a glucose monitor as told by your health care provider. The readings can help determine if you have low or high blood glucose. Generally, you should have these blood glucose levels: Before meals (preprandial): 80-130 mg/dL (4.0-9.8 mmol/L). After meals (postprandial): below 180 mg/dL (10 mmol/L). Hemoglobin A1c (HbA1c) level: less than 7%. Your health care provider will set treatment goals for you. Keep all follow-up visits. This is important. Follow these instructions at home: Diabetes medicines Take your diabetes medicines every day as told by your health care provider. List your diabetes medicines here: Name of medicine: ______________________________ Amount (dose): _______________ Time (a.m./p.m.): _______________ Notes: ___________________________________ Name of medicine: ______________________________ Amount (dose): _______________ Time (a.m./p.m.): _______________ Notes: ___________________________________ Name of medicine: ______________________________ Amount (dose): _______________ Time (a.m./p.m.): _______________ Notes: ___________________________________ Insulin If you use insulin, list the types of insulin you use here: Insulin type: ______________________________ Amount (dose): _______________ Time (a.m./p.m.): _______________Notes: ___________________________________ Insulin type: ______________________________ Amount (dose): _______________ Time (a.m./p.m.): _______________ Notes: ___________________________________ Insulin type: ______________________________ Amount (dose): _______________ Time (a.m./p.m.): _______________ Notes: ___________________________________ Insulin type: ______________________________ Amount (dose): _______________ Time (a.m./p.m.): _______________  Notes: ___________________________________ Insulin type: ______________________________ Amount (dose): _______________ Time (a.m./p.m.): _______________ Notes: ___________________________________ Managing blood glucose  Check your blood glucose levels using a glucose monitor as told by your health care provider. Write down the times that you check your glucose  levels here: Time: _______________ Notes: ___________________________________ Time: _______________ Notes: ___________________________________ Time: _______________ Notes: ___________________________________ Time: _______________ Notes: ___________________________________ Time: _______________ Notes: ___________________________________ Time: _______________ Notes: ___________________________________  Low blood glucose Low blood glucose (hypoglycemia) is when glucose is at or below 70 mg/dL (3.9 mmol/L). Symptoms may include: Feeling: Hungry. Sweaty and clammy. Irritable or easily upset. Dizzy. Sleepy. Having: A fast heartbeat. A headache. A change in your vision. Numbness around the mouth, lips, or tongue. Having trouble with: Moving (coordination). Sleeping. Treating low blood glucose To treat low blood glucose, eat or drink something containing sugar right away. If you can think clearly and swallow safely, follow the 15:15 rule: Take 15 grams of a fast-acting carb (carbohydrate), as told by your health care provider. Some fast-acting carbs are: Glucose tablets: take 3-4 tablets. Hard candy: eat 3-5 pieces. Fruit juice: drink 4 oz (120 mL). Regular (not diet) soda: drink 4-6 oz (120-180 mL). Honey or sugar: eat 1 Tbsp (15 mL). Check your blood glucose levels 15 minutes after you take the carb. If your glucose is still at or below 70 mg/dL (3.9 mmol/L), take 15 grams of a carb again. If your glucose does not go above 70 mg/dL (3.9 mmol/L) after 3 tries, get help right away. After your glucose goes back to normal, eat a  meal or a snack within 1 hour. Treating very low blood glucose If your glucose is at or below 54 mg/dL (3 mmol/L), you have very low blood glucose (severe hypoglycemia). This is an emergency. Do not wait to see if the symptoms will go away. Get medical help right away. Call your local emergency services (911 in the [place] Do not drive yourself to the hospital. Questions to ask your health care provider Should I talk with a diabetes educator? What equipment will I need to care for myself at home? What diabetes medicines do I need? When should I take them? How often do I need to check my blood glucose levels? What number can I call if I have questions? When is my follow-up visit? Where can I find a support group for people with diabetes? Where to find more information American Diabetes Association: www.diabetes.org Association of Diabetes Care and Education Specialists: www.diabeteseducator.org Contact a health care provider if: Your blood glucose is at or above 240 mg/dL (62.1 mmol/L) for 2 days in a row. You have been sick or have had a fever for 2 days or more, and you are not getting better. You have any of these problems for more than 6 hours: You cannot eat or drink. You feel nauseous. You vomit. You have diarrhea. Get help right away if: Your blood glucose is lower than 54 mg/dL (3 mmol/L). You get confused. You have trouble thinking clearly. You have trouble breathing. These symptoms may represent a serious problem that is an emergency. Do not wait to see if the symptoms will go away. Get medical help right away. Call your local emergency services (911 in the [place] Do not drive yourself to the hospital. Summary Diabetes mellitus is a chronic disease that occurs when the body does not properly use sugar (glucose) that is released from food after you eat. Take insulin and diabetes medicines as told. Check your blood glucose every day, as often as told. Keep all follow-up visits.  This is important. This information is not intended to replace advice given to you by your health care provider. Make sure you discuss any questions you have with your health care provider. Document Revised: 07/23/2019  Document Reviewed: 07/23/2019 Elsevier Patient Education  2024 [employer].

## 2022-10-31 ENCOUNTER — Other Ambulatory Visit: Payer: Self-pay | Admitting: Nurse Practitioner

## 2022-11-01 ENCOUNTER — Ambulatory Visit (INDEPENDENT_AMBULATORY_CARE_PROVIDER_SITE_OTHER): Payer: 59 | Admitting: Nurse Practitioner

## 2022-11-01 ENCOUNTER — Encounter: Payer: Self-pay | Admitting: Nurse Practitioner

## 2022-11-01 VITALS — BP 118/80 | HR 85 | Temp 98.0°F | Ht 67.01 in | Wt 210.0 lb

## 2022-11-01 DIAGNOSIS — E559 Vitamin D deficiency, unspecified: Secondary | ICD-10-CM | POA: Diagnosis not present

## 2022-11-01 DIAGNOSIS — E1169 Type 2 diabetes mellitus with other specified complication: Secondary | ICD-10-CM

## 2022-11-01 DIAGNOSIS — M545 Low back pain, unspecified: Secondary | ICD-10-CM | POA: Diagnosis not present

## 2022-11-01 DIAGNOSIS — E785 Hyperlipidemia, unspecified: Secondary | ICD-10-CM | POA: Diagnosis not present

## 2022-11-01 DIAGNOSIS — E6609 Other obesity due to excess calories: Secondary | ICD-10-CM

## 2022-11-01 DIAGNOSIS — I75023 Atheroembolism of bilateral lower extremities: Secondary | ICD-10-CM | POA: Diagnosis not present

## 2022-11-01 DIAGNOSIS — J449 Chronic obstructive pulmonary disease, unspecified: Secondary | ICD-10-CM | POA: Diagnosis not present

## 2022-11-01 DIAGNOSIS — F411 Generalized anxiety disorder: Secondary | ICD-10-CM

## 2022-11-01 DIAGNOSIS — I5032 Chronic diastolic (congestive) heart failure: Secondary | ICD-10-CM

## 2022-11-01 DIAGNOSIS — J9611 Chronic respiratory failure with hypoxia: Secondary | ICD-10-CM

## 2022-11-01 DIAGNOSIS — J432 Centrilobular emphysema: Secondary | ICD-10-CM

## 2022-11-01 DIAGNOSIS — F17219 Nicotine dependence, cigarettes, with unspecified nicotine-induced disorders: Secondary | ICD-10-CM

## 2022-11-01 DIAGNOSIS — E1159 Type 2 diabetes mellitus with other circulatory complications: Secondary | ICD-10-CM

## 2022-11-01 DIAGNOSIS — I739 Peripheral vascular disease, unspecified: Secondary | ICD-10-CM

## 2022-11-01 DIAGNOSIS — E1151 Type 2 diabetes mellitus with diabetic peripheral angiopathy without gangrene: Secondary | ICD-10-CM | POA: Diagnosis not present

## 2022-11-01 DIAGNOSIS — G8929 Other chronic pain: Secondary | ICD-10-CM

## 2022-11-01 DIAGNOSIS — J9612 Chronic respiratory failure with hypercapnia: Secondary | ICD-10-CM

## 2022-11-01 LAB — BAYER DCA HB A1C WAIVED: HB A1C (BAYER DCA - WAIVED): 6 % — ABNORMAL HIGH (ref 4.8–5.6)

## 2022-11-01 MED ORDER — HYDROXYZINE PAMOATE 25 MG PO CAPS: 25.0000 mg | ORAL_CAPSULE | Freq: Three times a day (TID) | ORAL | 4 refills | Status: DC | PRN

## 2022-11-01 MED ORDER — ACCU-CHEK GUIDE VI STRP: ORAL_STRIP | 12 refills | Status: DC

## 2022-11-01 MED ORDER — ALBUTEROL SULFATE (2.5 MG/3ML) 0.083% IN NEBU: 2.5000 mg | INHALATION_SOLUTION | Freq: Four times a day (QID) | RESPIRATORY_TRACT | 4 refills | Status: DC | PRN

## 2022-11-01 NOTE — Assessment & Plan Note (Signed)
Continue to collaborate with pulmonary.  Tolerating O2 at night well.

## 2022-11-01 NOTE — Assessment & Plan Note (Signed)
Per cardiology notes on review.  She is to follow-up with them in 3 months, recommend she call and schedule with them.  At this time continue current medication regimen and recommend: - Reminded to call for an overnight weight gain of >2 pounds or a weekly weight gain of >5 pounds - not adding salt to food and read food labels. Reviewed the importance of keeping daily sodium intake to '2000mg'$  daily. - Avoid NSAIDs

## 2022-11-01 NOTE — Assessment & Plan Note (Signed)
Ongoing since surgery years ago, has had multiple treatments and injections.  At this time will continue Duloxetine which is offering benefit to mood and pain.  Continue Lyrica 25 MG BID since is tolerating this medication, did not get benefit from Gabapentin in past.  Educated her on changes and medications + side effects.  Recommend stretching at home daily and movement.  Continue Lidocaine patches.  Referral to Dr. Franky Macho placed.

## 2022-11-01 NOTE — Assessment & Plan Note (Signed)
Chronic, ongoing. Denies SI/HI.  Will continue Duloxetine 60 MG since is offering benefit to mood and chronic back pain.  Educated her on this medication and change + side effects and BLACK BOX warning.  Suspect pain a big reason for mood changes.  Return in 3 months.

## 2022-11-01 NOTE — Assessment & Plan Note (Signed)
BMI 32.88.  Recommended eating smaller high protein, low fat meals more frequently and exercising 30 mins a day 5 times a week with a goal of 10-15lb weight loss in the next 3 months. Patient voiced their understanding and motivation to adhere to these recommendations.  

## 2022-11-01 NOTE — Progress Notes (Signed)
BP 118/80   Pulse 85   Temp 98 F (36.7 C) (Oral)   Ht 5' 7.01" (1.702 m)   Wt 210 lb (95.3 kg)   SpO2 93%   BMI 32.88 kg/m    Subjective:    Patient ID: Stacey Gross, female    DOB: 1961/02/20, 62 y.o.   MRN: [id number]  HPI: Stacey Gross is a 62 y.o. female  Chief Complaint  Patient presents with   Diabetes   Hypertension   Hyperlipidemia   COPD   HF   Mood   Pain   Friend at bedside to assist with HPI.  DIABETES A1c 6.6% April.  Continues on Metformin 500 MG BID and Januvia 100 MG daily -- has not taken Metformin in 2 days.  Metformin caused increase in bowel movements with higher dosing and caused worsening issues.  In past GLP1 medications caused major stomach issues and Farxiga caused yeast infections. Hypoglycemic episodes:no Polydipsia/polyuria: no Visual disturbance: no Chest pain: no Paresthesias: no Glucose Monitoring: yes             Accucheck frequency: daily             Fasting glucose: 115 this morning -- 100 to 110             Post prandial:             Evening:             Before meals: Taking Insulin?: no             Long acting insulin:             Short acting insulin: Blood Pressure Monitoring: not checking Retinal Examination: Up To Date Foot Exam: Up to Date Pneumovax: Up to Date Influenza: Not Up to Date -- refuses these Aspirin: no    HYPERTENSION / HYPERLIPIDEMIA/CHF Continues Olmesartan 5 MG, Verapamil 80 MG daily, Lasix 20 MG PRN, and Crestor 40 MG daily.  Cardiology last visit 11/24/21 with no changes.  EF on echo June 2022 was 60-65%.  Has visits with vascular for her blue toe syndrome/PVD and last saw 06/17/22, to follow annually. No changes made, she was frustrated with visit.  Her toes occasionally turn blue and blister, then clear up.   Satisfied with current treatment? yes Duration of hypertension: chronic BP monitoring frequency: weekly BP range: on average <130/80 BP medication side effects: no Duration of  hyperlipidemia: chronic Cholesterol medication side effects: no Cholesterol supplements: none Medication compliance: good compliance Aspirin: no Recent stressors: no Recurrent headaches: no Visual changes: no Palpitations: occasional Dyspnea: occasional Chest pain: no Lower extremity edema: no Dizzy/lightheaded: occasional if gets up too fast  COPD Continues to smoke, currently about 17 cigarettes, not interested in quitting -- has been smoking since age 31.  She has bought vape, barely any nicotine in it -- trying to cut back.  Lung CT screening 01/21/22 == emphysema and aortic atherosclerosis noted, to continue annually.  Pulmonary visit 01/06/22-- they recommended to continue Breztri and Albuterol as needed.  Uses oxygen at night.   COPD status: stable Satisfied with current treatment?: yes Oxygen use: no Dyspnea frequency: occasional  Cough frequency: at baseline Rescue inhaler frequency:  3-4 times a week Limitation of activity: no Productive cough: no Last Spirometry: with pulmonary Pneumovax: Up to Date Influenza: Refuses  ANXIETY/STRESS Currently taking Duloxetine for both pain and mood, which has been offering her benefit.  Has chronic back pain -- can not take  Tylenol.  Using lidocaine patches.  Had surgery in 2016 to lower back -- Dr. Franky Macho.  Severe spinal stenosis L4-L5.  Has had injections in past with no benefit.  Currently is taking Lyrica 25 MG BID along with Cymbalta for pain.  States on average pain is a 7/10.  Pain goes from lower back to bottom of legs.She continues on Vitamin D supplement at home daily.    In past we have used Wellbutrin, Duloxetine, Prozac, Celexa. Duration:stable Anxious mood: yes  Excessive worrying: no Irritability: no Sweating: no Nausea: no Palpitations:no Hyperventilation: no Panic attacks: no Agoraphobia: no  Obscessions/compulsions: no Depressed mood: no    Sep 30, 2022    9:51 AM 08/02/2022    9:44 AM 05/23/2022   11:06 AM  05/04/2022    8:53 AM 01/31/2022    2:44 PM  Depression screen PHQ 2/9  Decreased Interest 0 2 0 0 0  Down, Depressed, Hopeless 0 2 1 0 0  PHQ - 2 Score 0 4 1 0 0  Altered sleeping 2 2 0 0 0  Tired, decreased energy 1 2 0 0 0  Change in appetite 1 0 0 0 0  Feeling bad or failure about yourself  0 0 0 0 0  Trouble concentrating 0 0 0 0 0  Moving slowly or fidgety/restless 0 0 0 0 0  Suicidal thoughts 0 0 0 0 0  PHQ-9 Score 4 8 1  0 0  Difficult doing work/chores Not difficult at all Somewhat difficult Not difficult at all Not difficult at all Not difficult at all  Anhedonia: no Weight changes: no Insomnia: yes hard to fall asleep -- due to pain Hypersomnia: no Fatigue/loss of energy: no Feelings of worthlessness: no Feelings of guilt: no Impaired concentration/indecisiveness: no Suicidal ideations: no  Crying spells: no Recent Stressors/Life Changes: no   Relationship problems: no   Family stress: no     Financial stress: no    Job stress: no    Recent death/loss: no    2022-09-30    9:52 AM 08/02/2022    9:45 AM 05/04/2022    8:54 AM 01/31/2022    2:45 PM  GAD 7 : Generalized Anxiety Score  Nervous, Anxious, on Edge 0 2 0 0  Control/stop worrying 0 2 0 0  Worry too much - different things 1 2 0 0  Trouble relaxing 0 0 0 0  Restless 0 0 0 0  Easily annoyed or irritable 2 2 0 0  Afraid - awful might happen 0 2 0 0  Total GAD 7 Score 3 10 0 0  Anxiety Difficulty Not difficult at all Somewhat difficult Not difficult at all Not difficult at all   Relevant past medical, surgical, family and social history reviewed and updated as indicated. Interim medical history since our last visit reviewed. Allergies and medications reviewed and updated.  Review of Systems  Constitutional:  Negative for activity change, appetite change, diaphoresis, fatigue and fever.  Respiratory:  Positive for cough (baseline). Negative for chest tightness, shortness of breath and wheezing.    Cardiovascular:  Negative for chest pain, palpitations and leg swelling.  Gastrointestinal: Negative.   Endocrine: Negative for polydipsia, polyphagia and polyuria.  Musculoskeletal:  Positive for back pain.  Neurological:  Negative for dizziness, syncope, facial asymmetry, weakness, light-headedness, numbness and headaches.  Psychiatric/Behavioral: Negative.  Negative for sleep disturbance and suicidal ideas.     Per HPI unless specifically indicated above     Objective:    BP 118/80  Pulse 85   Temp 98 F (36.7 C) (Oral)   Ht 5' 7.01" (1.702 m)   Wt 210 lb (95.3 kg)   SpO2 93%   BMI 32.88 kg/m   Wt Readings from Last 3 Encounters:  11/01/22 210 lb (95.3 kg)  10/05/22 213 lb 12.8 oz (97 kg)  09/01/22 210 lb 12.8 oz (95.6 kg)    Physical Exam Vitals and nursing note reviewed.  Constitutional:      General: She is awake.     Appearance: She is well-developed and well-groomed. She is obese.  HENT:     Head: Normocephalic.     Right Ear: Hearing, tympanic membrane, ear canal and external ear normal.     Left Ear: Hearing, tympanic membrane, ear canal and external ear normal.     Nose:     Right Sinus: No maxillary sinus tenderness or frontal sinus tenderness.     Left Sinus: No maxillary sinus tenderness or frontal sinus tenderness.  Eyes:     General: Lids are normal.        Right eye: No discharge.        Left eye: No discharge.     Conjunctiva/sclera: Conjunctivae normal.     Pupils: Pupils are equal, round, and reactive to light.  Neck:     Thyroid: No thyromegaly.     Vascular: No carotid bruit.  Cardiovascular:     Rate and Rhythm: Normal rate and regular rhythm.     Heart sounds: Normal heart sounds. No murmur heard.    No gallop.  Pulmonary:     Effort: Pulmonary effort is normal. No accessory muscle usage or respiratory distress.     Breath sounds: Wheezing present.     Comments: Scattered expiratory wheezes throughout noted - baseline. Abdominal:      General: Bowel sounds are normal.     Palpations: Abdomen is soft.  Musculoskeletal:     Cervical back: Normal range of motion and neck supple.     Right lower leg: No edema.     Left lower leg: No edema.  Skin:    General: Skin is warm and dry.     Comments: Scattered pale purple bruising bilateral upper extremities.  Neurological:     Mental Status: She is alert and oriented to person, place, and time.  Psychiatric:        Attention and Perception: Attention normal.        Mood and Affect: Mood normal.        Speech: Speech normal.        Behavior: Behavior normal. Behavior is cooperative.        Thought Content: Thought content normal.    Results for orders placed or performed in visit on 08/02/22  Bayer DCA Hb A1c Waived  Result Value Ref Range   HB A1C (BAYER DCA - WAIVED) 6.6 (H) 4.8 - 5.6 %  Comprehensive metabolic panel  Result Value Ref Range   Glucose 94 70 - 99 mg/dL   BUN 15 8 - 27 mg/dL   Creatinine, Ser 1.61 0.57 - 1.00 mg/dL   eGFR 67 >09 UE/AVW/0.98   BUN/Creatinine Ratio 16 12 - 28   Sodium 137 134 - 144 mmol/L   Potassium 4.8 3.5 - 5.2 mmol/L   Chloride 98 96 - 106 mmol/L   CO2 21 20 - 29 mmol/L   Calcium 9.4 8.7 - 10.3 mg/dL   Total Protein 6.5 6.0 - 8.5 g/dL   Albumin 4.5 3.9 -  4.9 g/dL   Globulin, Total 2.0 1.5 - 4.5 g/dL   Albumin/Globulin Ratio 2.3 (H) 1.2 - 2.2   Bilirubin Total 0.4 0.0 - 1.2 mg/dL   Alkaline Phosphatase 80 44 - 121 IU/L   AST 22 0 - 40 IU/L   ALT 18 0 - 32 IU/L  Lipid Panel w/o Chol/HDL Ratio  Result Value Ref Range   Cholesterol, Total 121 100 - 199 mg/dL   Triglycerides 433 (H) 0 - 149 mg/dL   HDL 49 >29 mg/dL   VLDL Cholesterol Cal 27 5 - 40 mg/dL   LDL Chol Calc (NIH) 45 0 - 99 mg/dL      Assessment & Plan:   Problem List Items Addressed This Visit       Cardiovascular and Mediastinum   Blue toe syndrome (HCC)    Chronic, ongoing.  Continue collaboration with vascular.  Recent note reviewed.      Chronic  diastolic heart failure [facility])    Per cardiology notes on review.  She is to follow-up with them in 3 months, recommend she call and schedule with them.  At this time continue current medication regimen and recommend: - Reminded to call for an overnight weight gain of >2 pounds or a weekly weight gain of >5 pounds - not adding salt to food and read food labels. Reviewed the importance of keeping daily sodium intake to 2000mg  daily. - Avoid NSAIDs      Hypertension associated with diabetes (HCC)    Chronic, stable.  BP well below goal in office today and at home.  Continue Olmesartan 5 MG daily for kidney protection and Verapamil 80 MG daily, discussed with patient and support system.  Recommend she monitor BP at least a few mornings a week at home and document.  DASH diet at home.  Labs today: up to date.  Return in 3 months.      Relevant Orders   Bayer DCA Hb A1c Waived   Type 2 diabetes, controlled, with peripheral circulatory disorder (HCC) - Primary    Chronic, ongoing with A1c stable at 6% today, reduced from 6.6% last visit.  Praised for this.  Urine ALB 80 January 2024.  Recommend she continue Olmesartan daily for kidney protection. Will try to cut back on Metformin to once daily due to her GI issues with this. Continue Januvia 100 MG daily, educated her on this.  She did not tolerate GLP1 or SGLT2 in past or increase in Metformin.  Check BS BID.  Monitor diet at home and reduce carbs and foods high in sugar.  Continue to collaborate with vascular. She prefers not to start insulin if ever needed.  - Eye exam up to date.  Foot exam next visit. - ARB and statin on board. - Vaccinations up to date.      Relevant Orders   Bayer DCA Hb A1c Waived     Respiratory   Centrilobular emphysema (HCC)    Chronic, ongoing.  Continue Breztri daily and Albuterol as needed.  Recommend complete cessation of smoking.  Continue annual CT scans.  Continue collaboration with pulmonary, which is  beneficial.        Relevant Medications   albuterol (PROVENTIL) (2.5 MG/3ML) 0.083% nebulizer solution   Chronic respiratory failure with hypoxia and hypercapnia (HCC)    Continue to collaborate with pulmonary.  Tolerating O2 at night well.        Endocrine   Hyperlipidemia associated with type 2 diabetes mellitus (HCC)  Chronic, ongoing.  Highly recommend she continue Crestor and adjust dosing as needed or add on Zetia if poor control.  Lipid check today.      Relevant Orders   Bayer DCA Hb A1c Waived     Nervous and Auditory   Nicotine dependence, cigarettes, w unsp disorders    I have recommended complete cessation of tobacco use. I have discussed various options available for assistance with tobacco cessation including over the counter methods (Nicotine gum, patch and lozenges). We also discussed prescription options (Chantix, Nicotine Inhaler / Nasal Spray). The patient is not interested in pursuing any prescription tobacco cessation options at this time.  Continue yearly lung screening.         Other   Chronic bilateral low back pain    Ongoing since surgery years ago, has had multiple treatments and injections.  At this time will continue Duloxetine which is offering benefit to mood and pain.  Continue Lyrica 25 MG BID since is tolerating this medication, did not get benefit from Gabapentin in past.  Educated her on changes and medications + side effects.  Recommend stretching at home daily and movement.  Continue Lidocaine patches.  Referral to Dr. Franky Macho placed.      Relevant Orders   Ambulatory referral to Neurosurgery   Generalized anxiety disorder    Chronic, ongoing. Denies SI/HI.  Will continue Duloxetine 60 MG since is offering benefit to mood and chronic back pain.  Educated her on this medication and change + side effects and BLACK BOX warning.  Suspect pain a big reason for mood changes.  Return in 3 months.      Relevant Medications   hydrOXYzine (VISTARIL)  25 MG capsule   Obesity    BMI 32.88.  Recommended eating smaller high protein, low fat meals more frequently and exercising 30 mins a day 5 times a week with a goal of 10-15lb weight loss in the next 3 months. Patient voiced their understanding and motivation to adhere to these recommendations.       Vitamin D deficiency    Chronic.  Noted past labs, continue supplement and recheck level today.      Relevant Orders   VITAMIN D 25 Hydroxy (Vit-D Deficiency, Fractures)      Follow up plan: Return in about 3 months (around 02/01/2023) for T2DM, HTN/HLD, COPD, MOOD + needs foot exam (shoes off please).

## 2022-11-01 NOTE — Assessment & Plan Note (Signed)
Chronic, ongoing with A1c stable at 6% today, reduced from 6.6% last visit.  Praised for this.  Urine ALB 80 January 2024.  Recommend she continue Olmesartan daily for kidney protection. Will try to cut back on Metformin to once daily due to her GI issues with this. Continue Januvia 100 MG daily, educated her on this.  She did not tolerate GLP1 or SGLT2 in past or increase in Metformin.  Check BS BID.  Monitor diet at home and reduce carbs and foods high in sugar.  Continue to collaborate with vascular. She prefers not to start insulin if ever needed.  - Eye exam up to date.  Foot exam next visit. - ARB and statin on board. - Vaccinations up to date.

## 2022-11-01 NOTE — Assessment & Plan Note (Addendum)
Chronic.  Noted past labs, continue supplement and recheck level today.

## 2022-11-01 NOTE — Assessment & Plan Note (Signed)
Chronic, ongoing.  Highly recommend she continue Crestor and adjust dosing as needed or add on Zetia if poor control.  Lipid check today.

## 2022-11-01 NOTE — Assessment & Plan Note (Signed)
I have recommended complete cessation of tobacco use. I have discussed various options available for assistance with tobacco cessation including over the counter methods (Nicotine gum, patch and lozenges). We also discussed prescription options (Chantix, Nicotine Inhaler / Nasal Spray). The patient is not interested in pursuing any prescription tobacco cessation options at this time.  Continue yearly lung screening.  

## 2022-11-01 NOTE — Telephone Encounter (Signed)
Requested Prescriptions  Refused Prescriptions Disp Refills   citalopram (CELEXA) 20 MG tablet [Pharmacy Med Name: CITALOPRAM HYDROBROMIDE 20 MG TAB] 90 tablet 4    Sig: TAKE 1 TABLET BY MOUTH DAILY     Psychiatry:  Antidepressants - SSRI Passed - 10/31/2022  4:54 PM      Passed - Valid encounter within last 6 months    Recent Outpatient Visits           Today Type 2 diabetes, controlled, with peripheral circulatory disorder (HCC)   [place] [facility] Ear[place], Jolene T, NP   3 weeks ago Chronic bilateral low back pain without sciatica   [facility] [place], Corrie Dandy T, NP   2 months ago Hypertension associated with diabetes (HCC)   [place] [facility] [place], [place] T, NP   3 months ago Type 2 diabetes, controlled, with peripheral circulatory disorder (HCC)   [place] [facility] [place], Byng T, NP   6 months ago Type 2 diabetes, controlled, with peripheral circulatory disorder (HCC)   [place] [facility] [place], [place] T, NP       Future Appointments             In 3 months [place], [place] T, NP [place] [facility]             albuterol (PROVENTIL) (2.5 MG/3ML) 0.083% nebulizer solution [Pharmacy Med Name: ALBUTEROL SULFATE (2.5 MG/3ML) 0.08] 180 mL 0    Sig: USE 1 VIAL (3 ML = 2.5 MG TOTAL) BY NEBULIZATION EVERY 6 HOURS AS NEEDED FORWHEEZING OR SHORTNESS OF BREATH     Pulmonology:  Beta Agonists 2 Passed - 10/31/2022  4:54 PM      Passed - Last BP in normal range    BP Readings from Last 1 Encounters:  11/01/22 118/80         Passed - Last Heart Rate in normal range    Pulse Readings from Last 1 Encounters:  11/01/22 85         Passed - Valid encounter within last 12 months    Recent Outpatient Visits           Today Type 2 diabetes, controlled, with peripheral circulatory disorder (HCC)   [place] [facility] [place], [place] T, NP    3 weeks ago Chronic bilateral low back pain without sciatica   [place] [facility] [place], [place] T, NP   2 months ago Hypertension associated with diabetes (HCC)   [place] [facility] [place], [place] T, NP   3 months ago Type 2 diabetes, controlled, with peripheral circulatory disorder (HCC)   [place] [facility] [place], Jolene T, NP   6 months ago Type 2 diabetes, controlled, with peripheral circulatory disorder (HCC)   [place] [facility] Jo[place], Dorie Rank, NP       Future Appointments             In 3 months Cannady, Dorie Rank, NP [place] [facility]

## 2022-11-01 NOTE — Assessment & Plan Note (Signed)
Bilateral upper extremity.  Recommend gentle skin cleansing daily and application of lotion daily.  Monitor skin and notify provider if any abrasions or wounds. 

## 2022-11-01 NOTE — Assessment & Plan Note (Signed)
Chronic, ongoing.  Continue collaboration with vascular.  Recent note reviewed.

## 2022-11-01 NOTE — Assessment & Plan Note (Signed)
Chronic, stable.  BP well below goal in office today and at home.  Continue Olmesartan 5 MG daily for kidney protection and Verapamil 80 MG daily, discussed with patient and support system.  Recommend she monitor BP at least a few mornings a week at home and document.  DASH diet at home.  Labs today: up to date.  Return in 3 months.

## 2022-11-01 NOTE — Assessment & Plan Note (Signed)
Chronic, ongoing.  Continue Breztri daily and Albuterol as needed.  Recommend complete cessation of smoking.  Continue annual CT scans.  Continue collaboration with pulmonary, which is beneficial.   

## 2022-11-02 DIAGNOSIS — J449 Chronic obstructive pulmonary disease, unspecified: Secondary | ICD-10-CM | POA: Diagnosis not present

## 2022-11-02 DIAGNOSIS — J9611 Chronic respiratory failure with hypoxia: Secondary | ICD-10-CM | POA: Diagnosis not present

## 2022-11-02 NOTE — Progress Notes (Signed)
Contacted via MyChart   Vitamin D level improved.  Keep up the great work!!  Proud of you!!

## 2022-11-15 ENCOUNTER — Other Ambulatory Visit: Payer: Self-pay | Admitting: Neurosurgery

## 2022-11-15 DIAGNOSIS — M48062 Spinal stenosis, lumbar region with neurogenic claudication: Secondary | ICD-10-CM | POA: Diagnosis not present

## 2022-11-17 ENCOUNTER — Ambulatory Visit
Admission: RE | Admit: 2022-11-17 | Discharge: 2022-11-17 | Disposition: A | Discharge status: Home / Self Care | Payer: 59 | Source: Ambulatory Visit | Attending: Neurosurgery | Admitting: Neurosurgery

## 2022-11-17 DIAGNOSIS — M48061 Spinal stenosis, lumbar region without neurogenic claudication: Secondary | ICD-10-CM | POA: Diagnosis not present

## 2022-11-17 DIAGNOSIS — M48062 Spinal stenosis, lumbar region with neurogenic claudication: Secondary | ICD-10-CM | POA: Diagnosis not present

## 2022-11-17 DIAGNOSIS — M4316 Spondylolisthesis, lumbar region: Secondary | ICD-10-CM | POA: Diagnosis not present

## 2022-11-17 DIAGNOSIS — M5136 Other intervertebral disc degeneration, lumbar region: Secondary | ICD-10-CM | POA: Diagnosis not present

## 2022-11-17 DIAGNOSIS — M47816 Spondylosis without myelopathy or radiculopathy, lumbar region: Secondary | ICD-10-CM | POA: Diagnosis not present

## 2022-11-17 MED ORDER — GADOBUTROL 1 MMOL/ML IV SOLN
9.0000 mL | Freq: Once | INTRAVENOUS | Status: AC | PRN
  Administered 2022-11-17: 9 mL via INTRAVENOUS

## 2022-11-22 DIAGNOSIS — M48062 Spinal stenosis, lumbar region with neurogenic claudication: Secondary | ICD-10-CM | POA: Diagnosis not present

## 2022-11-22 DIAGNOSIS — M5136 Other intervertebral disc degeneration, lumbar region: Secondary | ICD-10-CM | POA: Diagnosis not present

## 2022-11-22 DIAGNOSIS — M4316 Spondylolisthesis, lumbar region: Secondary | ICD-10-CM | POA: Diagnosis not present

## 2022-11-24 ENCOUNTER — Other Ambulatory Visit: Payer: Self-pay | Admitting: Neurosurgery

## 2022-11-29 ENCOUNTER — Ambulatory Visit: Payer: 59 | Admitting: Podiatry

## 2022-11-29 ENCOUNTER — Encounter: Payer: Self-pay | Admitting: Podiatry

## 2022-11-29 DIAGNOSIS — I739 Peripheral vascular disease, unspecified: Secondary | ICD-10-CM | POA: Diagnosis not present

## 2022-11-29 DIAGNOSIS — D2372 Other benign neoplasm of skin of left lower limb, including hip: Secondary | ICD-10-CM

## 2022-11-29 NOTE — Progress Notes (Signed)
Chief Complaint  Patient presents with   Callouses    "He usually shaves my heels and the corns."    Subjective: 62 y.o. female PMHx diabetes mellitus, history of smoking and COPD presenting to the office today for evaluation of pain and tenderness associated to the bilateral forefoot.  Patient also has history of peripheral vascular disease.  She says that her feet are always cold.   Past Medical History:  Diagnosis Date   Asthma    Cancer (HCC)    stomach   Chronic back pain    COPD (chronic obstructive pulmonary disease) (HCC)    Degenerative joint disease (DJD) of hip    Diabetes mellitus without complication (HCC)    Dyspnea    GERD (gastroesophageal reflux disease)    takes Omeprazole daily   History of bronchitis yrs ago   History of colon polyps    benign   History of staph infection 10 yrs ago   Hyperlipidemia    takes Atorvastatin daily   Hypertension    Palpitations    Peripheral vascular disease (HCC)    Tobacco use    8 cigarettes daily    Weakness    numbness and tingling in both legs    Wears dentures    full upper    Past Surgical History:  Procedure Laterality Date   APPENDECTOMY     BACK SURGERY  9-10 yrs ago   BREAST BIOPSY Right    Core- neg   COLONOSCOPY     COLONOSCOPY WITH PROPOFOL N/A 09/20/2017   Procedure: COLONOSCOPY WITH PROPOFOL;  Surgeon: Pasty Spillers, MD;  Location: [facility] ENDOSCOPY;  Service: Endoscopy;  Laterality: N/A;   COLONOSCOPY WITH PROPOFOL N/A 04/24/2018   Procedure: COLONOSCOPY WITH PROPOFOL;  Surgeon: Midge Minium, MD;  Location: [facility] ENDOSCOPY;  Service: Endoscopy;  Laterality: N/A;   COLONOSCOPY WITH PROPOFOL N/A 06/10/2021   Procedure: COLONOSCOPY WITH BIOPSY;  Surgeon: Midge Minium, MD;  Location: [facility] SURGERY CNTR;  Service: Endoscopy;  Laterality: N/A;   ESOPHAGEAL DILATION  06/10/2021   Procedure: ESOPHAGEAL DILATION;  Surgeon: Midge Minium, MD;  Location: [facility] SURGERY [facility];  Service: Endoscopy;;   15-18 mm   ESOPHAGOGASTRODUODENOSCOPY (EGD) WITH PROPOFOL N/A 09/20/2017   Procedure: ESOPHAGOGASTRODUODENOSCOPY (EGD) WITH PROPOFOL;  Surgeon: Pasty Spillers, MD;  Location: [facility] ENDOSCOPY;  Service: Endoscopy;  Laterality: N/A;   ESOPHAGOGASTRODUODENOSCOPY (EGD) WITH PROPOFOL N/A 11/20/2018   Procedure: ESOPHAGOGASTRODUODENOSCOPY (EGD) WITH PROPOFOL;  Surgeon: Midge Minium, MD;  Location: [facility] ENDOSCOPY;  Service: Endoscopy;  Laterality: N/A;   ESOPHAGOGASTRODUODENOSCOPY (EGD) WITH PROPOFOL N/A 06/10/2021   Procedure: ESOPHAGOGASTRODUODENOSCOPY (EGD) WITH BIOPSY;  Surgeon: Midge Minium, MD;  Location: [facility];  Service: Endoscopy;  Laterality: N/A;  Diabetic Latex   EYE SURGERY     PERIPHERAL VASCULAR CATHETERIZATION Left 03/17/2016   Procedure: Lower Extremity Angiography;  Surgeon: Annice Needy, MD;  Location: [facility] INVASIVE CV LAB;  Service: Cardiovascular;  Laterality: Left;   POLYPECTOMY N/A 06/10/2021   Procedure: POLYPECTOMY;  Surgeon: Midge Minium, MD;  Location: [facility] SUR[facility]ERY [facility];  Service: Endoscopy;  Laterality: N/A;   TOTAL ABDOMINAL HYSTERECTOMY W/ BILATERAL SALPINGOOPHORECTOMY     total   TOTAL SHOULDER ARTHROPLASTY Right 07/24/2019   Procedure: RIGHT TOTAL SHOULDER ARTHROPLASTY;  Surgeon: Lyndle Herrlich, MD;  Location: [facility] ORS;  Service: Orthopedics;  Laterality: Right;    Allergies  Allergen Reactions   Doxycycline Hyclate Anaphylaxis, Swelling and Rash   Acetaminophen Swelling    Tongue swelling and breaks out  in welts   Aspirin Swelling    Tongue swelling and breaks out in welts   Latex Rash    Gloves, underwear elastic, tape   Vancomycin Rash    All mycins     Objective:  Physical Exam General: Alert and oriented x3 in no acute distress  Dermatology: Hyperkeratotic lesion(s) present on the plantar aspect of the fifth MTP bilateral. Pain on palpation with a central nucleated core noted. Skin is warm, dry and supple bilateral lower  extremities. Negative for open lesions or macerations.  Vascular: Pulses are palpable.  Moderate edema noted bilateral.  There is delayed capillary refill to the toes however.  Suspicious for microvascular disease  VAS [place] ABI WITH/WO TBI 06/17/2022 ABI Findings:  +---------+------------------+-----+---------+--------+  Right   Rt Pressure (mmHg)IndexWaveform Comment   +---------+------------------+-----+---------+--------+  Brachial 133                                       +---------+------------------+-----+---------+--------+  ATA     142               1.07 triphasic          +---------+------------------+-----+---------+--------+  PTA     148               1.11 triphasic          +---------+------------------+-----+---------+--------+  [facility]               1.00 Normal             +---------+------------------+-----+---------+--------+   +---------+------------------+-----+---------+-------+  Left    Lt Pressure (mmHg)IndexWaveform Comment  +---------+------------------+-----+---------+-------+  Brachial 130                                      +---------+------------------+-----+---------+-------+  ATA     140               1.05 triphasic         +---------+------------------+-----+---------+-------+  PTA     162               1.22 triphasic         +---------+------------------+-----+---------+-------+  Berline Chough               1.00 Normal            +---------+------------------+-----+---------+-------+   +-------+-----------+-----------+------------+------------+  ABI/TBIToday's ABIToday's TBIPrevious ABIPrevious TBI  +-------+-----------+-----------+------------+------------+  Right 1.11       1.00       1.17        1.28          +-------+-----------+-----------+------------+------------+  Left  1.22       1.00       1.15        1.08           +-------+-----------+-----------+------------+------------+  Bilateral ABIs and TBIs appear essentially unchanged compared to prior study on 2020.    Summary:  Right: Resting right ankle-brachial index is within normal range. The  right toe-brachial index is normal.   Left: Resting left ankle-brachial index is within normal range. The left  toe-brachial index is normal.  Neurological: Grossly intact via light touch  Musculoskeletal Exam: Pain on palpation at the keratotic lesion(s) noted. Range of motion within normal limits bilateral. Muscle strength 5/5 in all groups bilateral.  Assessment: 1.  Symptomatic benign skin lesion 2. Microvascular disease bilateral toes  Plan of Care:  -Patient evaluated -Excisional debridement of keratoic lesion(s) using a chisel blade was performed without incident.  -Salicylic acid applied with a bandaid - Patient felt significantly -In regards to the ED circulation, ABIs were completed.  Vascular symptom monitoring. -Advised against going barefoot.  Recommend good supportive shoes and sneakers -Return to clinic as needed  Stacey Gross, DPM [facility]  Dr. Felecia Gross, DPM    [address] [place], [place] [address]                Office [phone]  Fax [phone]

## 2022-11-30 NOTE — Pre-Procedure Instructions (Signed)
Surgical Instructions   Your procedure is scheduled on Thursday, September 5th. Report to [facility] Main Entrance "A" at 10:30 A.M., then check in with the Admitting office. Any questions or running late day of surgery: call [phone]  Questions prior to your surgery date: call [phone], Monday-Friday, 8am-4pm. If you experience any cold or flu symptoms such as cough, fever, chills, shortness of breath, etc. between now and your scheduled surgery, please notify us at the above number.     Remember:  Do not eat after midnight the night before your surgery  You may drink clear liquids until 09:30 AM the morning of your surgery.   Clear liquids allowed are: Water, Non-Citrus Juices (without pulp), Carbonated Beverages, Clear Tea, Black Coffee Only (NO MILK, CREAM OR POWDERED CREAMER of any kind), and Gatorade.    Take these medicines the morning of surgery with A SIP OF WATER  Budeson-Glycopyrrol-Formoterol (BREZTRI AEROSPHERE)  DULoxetine (CYMBALTA)  pantoprazole (PROTONIX)  pregabalin (LYRICA)  rosuvastatin (CRESTOR)  verapamil (CALAN)    May take these medicines IF NEEDED: albuterol (PROVENTIL)  albuterol (VENTOLIN HFA)- bring inhaler with you on day of surgery cyclobenzaprine (FLEXERIL)  hydrOXYzine (VISTARIL)  prochlorperazine (COMPAZINE)   One week prior to surgery, STOP taking any Aspirin (unless otherwise instructed by your surgeon) Aleve, Naproxen, Ibuprofen, Motrin, Advil, Goody's, BC's, all herbal medications, fish oil, and non-prescription vitamins.  WHAT DO I DO ABOUT MY DIABETES MEDICATION?   Do not take metFORMIN (GLUCOPHAGE) or sitaGLIPtin (JANUVIA) the morning of surgery.    HOW TO MANAGE YOUR DIABETES BEFORE AND AFTER SURGERY  Why is it important to control my blood sugar before and after surgery? Improving blood sugar levels before and after surgery helps healing and can limit problems. A way of improving blood sugar control is eating a healthy  diet by:  Eating less sugar and carbohydrates  Increasing activity/exercise  Talking with your doctor about reaching your blood sugar goals High blood sugars (greater than 180 mg/dL) can raise your risk of infections and slow your recovery, so you will need to focus on controlling your diabetes during the weeks before surgery. Make sure that the doctor who takes care of your diabetes knows about your planned surgery including the date and location.  How do I manage my blood sugar before surgery? Check your blood sugar at least 4 times a day, starting 2 days before surgery, to make sure that the level is not too high or low.  Check your blood sugar the morning of your surgery when you wake up and every 2 hours until you get to the Short Stay unit.  If your blood sugar is less than 70 mg/dL, you will need to treat for low blood sugar: Do not take insulin. Treat a low blood sugar (less than 70 mg/dL) with  cup of clear juice (cranberry or apple), 4 glucose tablets, OR glucose gel. Recheck blood sugar in 15 minutes after treatment (to make sure it is greater than 70 mg/dL). If your blood sugar is not greater than 70 mg/dL on recheck, call [phone] for further instructions. Report your blood sugar to the short stay nurse when you get to Short Stay.  If you are admitted to the hospital after surgery: Your blood sugar will be checked by the staff and you will probably be given insulin after surgery (instead of oral diabetes medicines) to make sure you have good blood sugar levels. The goal for blood sugar control after surgery is 80-180 mg/dL.  Do NOT Smoke (Tobacco/Vaping) for 24 hours prior to your procedure.  If you use a CPAP at night, you may bring your mask/headgear for your overnight stay.   You will be asked to remove any contacts, glasses, piercing's, hearing aid's, dentures/partials prior to surgery. Please bring cases for these items if needed.    Patients  discharged the day of surgery will not be allowed to drive home, and someone needs to stay with them for 24 hours.  SURGICAL WAITING ROOM VISITATION Patients may have no more than 2 support people in the waiting area - these visitors may rotate.   Pre-op nurse will coordinate an appropriate time for 1 ADULT support person, who may not rotate, to accompany patient in pre-op.  Children under the age of 17 must have an adult with them who is not the patient and must remain in the main waiting area with an adult.  If the patient needs to stay at the hospital during part of their recovery, the visitor guidelines for inpatient rooms apply.  Please refer to the [facility] website for the visitor guidelines for any additional information.   If you received a COVID test during your pre-op visit  it is requested that you wear a mask when out in public, stay away from anyone that may not be feeling well and notify your surgeon if you develop symptoms. If you have been in contact with anyone that has tested positive in the last 10 days please notify you surgeon.      Pre-operative 5 CHG Bathing Instructions   You can play a key role in reducing the risk of infection after surgery. Your skin needs to be as free of germs as possible. You can reduce the number of germs on your skin by washing with CHG (chlorhexidine gluconate) soap before surgery. CHG is an antiseptic soap that kills germs and continues to kill germs even after washing.   DO NOT use if you have an allergy to chlorhexidine/CHG or antibacterial soaps. If your skin becomes reddened or irritated, stop using the CHG and notify one of our RNs at [phone].   Please shower with the CHG soap starting 4 days before surgery using the following schedule:     Please keep in mind the following:  DO NOT shave, including legs and underarms, starting the day of your first shower.   You may shave your face at any point before/day of surgery.   Place clean sheets on your bed the day you start using CHG soap. Use a clean washcloth (not used since being washed) for each shower. DO NOT sleep with pets once you start using the CHG.   CHG Shower Instructions:  If you choose to wash your hair and private area, wash first with your normal shampoo/soap.  After you use shampoo/soap, rinse your hair and body thoroughly to remove shampoo/soap residue.  Turn the water OFF and apply about 3 tablespoons (45 ml) of CHG soap to a CLEAN washcloth.  Apply CHG soap ONLY FROM YOUR NECK DOWN TO YOUR TOES (washing for 3-5 minutes)  DO NOT use CHG soap on face, private areas, open wounds, or sores.  Pay special attention to the area where your surgery is being performed.  If you are having back surgery, having someone wash your back for you may be helpful. Wait 2 minutes after CHG soap is applied, then you may rinse off the CHG soap.  Pat dry with a clean towel  Put on clean  clothes/pajamas   If you choose to wear lotion, please use ONLY the CHG-compatible lotions on the back of this paper.   Additional instructions for the day of surgery: DO NOT APPLY any lotions, deodorants, cologne, or perfumes.   Do not bring valuables to the hospital. [facility] is not responsible for any belongings/valuables. Do not wear nail polish, gel polish, artificial nails, or any other type of covering on natural nails (fingers and toes) Do not wear jewelry or makeup Put on clean/comfortable clothes.  Please brush your teeth.  Ask your nurse before applying any prescription medications to the skin.     CHG Compatible Lotions   Aveeno Moisturizing lotion  Cetaphil Moisturizing Cream  Cetaphil Moisturizing Lotion  Clairol Herbal Essence Moisturizing Lotion, Dry Skin  Clairol Herbal Essence Moisturizing Lotion, Extra Dry Skin  Clairol Herbal Essence Moisturizing Lotion, Normal Skin  Curel Age Defying Therapeutic Moisturizing Lotion with Alpha Hydroxy  Curel  Extreme Care Body Lotion  Curel Soothing Hands Moisturizing Hand Lotion  Curel Therapeutic Moisturizing Cream, Fragrance-Free  Curel Therapeutic Moisturizing Lotion, Fragrance-Free  Curel Therapeutic Moisturizing Lotion, Original Formula  Eucerin Daily Replenishing Lotion  Eucerin Dry Skin Therapy Plus Alpha Hydroxy Crme  Eucerin Dry Skin Therapy Plus Alpha Hydroxy Lotion  Eucerin Original Crme  Eucerin Original Lotion  Eucerin Plus Crme Eucerin Plus Lotion  Eucerin TriLipid Replenishing Lotion  Keri Anti-Bacterial Hand Lotion  Keri Deep Conditioning Original Lotion Dry Skin Formula Softly Scented  Keri Deep Conditioning Original Lotion, Fragrance Free Sensitive Skin Formula  Keri Lotion Fast Absorbing Fragrance Free Sensitive Skin Formula  Keri Lotion Fast Absorbing Softly Scented Dry Skin Formula  Keri Original Lotion  Keri Skin Renewal Lotion Keri Silky Smooth Lotion  Keri Silky Smooth Sensitive Skin Lotion  Nivea Body Creamy Conditioning Oil  Nivea Body Extra Enriched Lotion  Nivea Body Original Lotion  Nivea Body Sheer Moisturizing Lotion Nivea Crme  Nivea Skin Firming Lotion  NutraDerm 30 Skin Lotion  NutraDerm Skin Lotion  NutraDerm Therapeutic Skin Cream  NutraDerm Therapeutic Skin Lotion  ProShield Protective Hand Cream  Provon moisturizing lotion  Please read over the following fact sheets that you were given.

## 2022-12-01 ENCOUNTER — Encounter (HOSPITAL_COMMUNITY): Payer: Self-pay

## 2022-12-01 ENCOUNTER — Other Ambulatory Visit: Payer: Self-pay

## 2022-12-01 ENCOUNTER — Encounter (HOSPITAL_COMMUNITY)
Admission: RE | Admit: 2022-12-01 | Discharge: 2022-12-01 | Disposition: A | Discharge status: Home / Self Care | Payer: 59 | Source: Ambulatory Visit | Attending: Neurosurgery | Admitting: Neurosurgery

## 2022-12-01 VITALS — BP 125/79 | HR 90 | Temp 97.9°F | Resp 17 | Ht 67.0 in | Wt 215.7 lb

## 2022-12-01 DIAGNOSIS — Z01812 Encounter for preprocedural laboratory examination: Secondary | ICD-10-CM | POA: Diagnosis not present

## 2022-12-01 DIAGNOSIS — I5032 Chronic diastolic (congestive) heart failure: Secondary | ICD-10-CM | POA: Diagnosis not present

## 2022-12-01 DIAGNOSIS — M4316 Spondylolisthesis, lumbar region: Secondary | ICD-10-CM | POA: Diagnosis not present

## 2022-12-01 DIAGNOSIS — J449 Chronic obstructive pulmonary disease, unspecified: Secondary | ICD-10-CM | POA: Diagnosis not present

## 2022-12-01 DIAGNOSIS — I110 Hypertensive heart disease with heart failure: Secondary | ICD-10-CM | POA: Insufficient documentation

## 2022-12-01 DIAGNOSIS — E785 Hyperlipidemia, unspecified: Secondary | ICD-10-CM | POA: Diagnosis not present

## 2022-12-01 DIAGNOSIS — E1151 Type 2 diabetes mellitus with diabetic peripheral angiopathy without gangrene: Secondary | ICD-10-CM | POA: Insufficient documentation

## 2022-12-01 DIAGNOSIS — Z01818 Encounter for other preprocedural examination: Secondary | ICD-10-CM | POA: Diagnosis present

## 2022-12-01 DIAGNOSIS — Z01810 Encounter for preprocedural cardiovascular examination: Secondary | ICD-10-CM | POA: Insufficient documentation

## 2022-12-01 HISTORY — DX: Unspecified diastolic (congestive) heart failure: I50.30

## 2022-12-01 LAB — CBC
HCT: 39.6 % (ref 36.0–46.0)
Hemoglobin: 12.8 g/dL (ref 12.0–15.0)
MCH: 29.7 pg (ref 26.0–34.0)
MCHC: 32.3 g/dL (ref 30.0–36.0)
MCV: 91.9 fL (ref 80.0–100.0)
Platelets: 262 10*3/uL (ref 150–400)
RBC: 4.31 MIL/uL (ref 3.87–5.11)
RDW: 12 % (ref 11.5–15.5)
WBC: 11.3 10*3/uL — ABNORMAL HIGH (ref 4.0–10.5)
nRBC: 0 % (ref 0.0–0.2)

## 2022-12-01 LAB — TYPE AND SCREEN
ABO/RH(D): O POS
Antibody Screen: NEGATIVE

## 2022-12-01 LAB — BASIC METABOLIC PANEL
Anion gap: 10 (ref 5–15)
BUN: 8 mg/dL (ref 8–23)
CO2: 27 mmol/L (ref 22–32)
Calcium: 8.8 mg/dL — ABNORMAL LOW (ref 8.9–10.3)
Chloride: 100 mmol/L (ref 98–111)
Creatinine, Ser: 0.86 mg/dL (ref 0.44–1.00)
GFR, Estimated: >60 mL/min (ref >60)
Glucose, Bld: 84 mg/dL (ref 70–99)
Potassium: 3.8 mmol/L (ref 3.5–5.1)
Sodium: 137 mmol/L (ref 135–145)

## 2022-12-01 LAB — SURGICAL PCR SCREEN
MRSA, PCR: NEGATIVE
Staphylococcus aureus: NEGATIVE

## 2022-12-01 LAB — GLUCOSE, CAPILLARY: Glucose-Capillary: 84 mg/dL (ref 70–99)

## 2022-12-01 NOTE — Anesthesia Preprocedure Evaluation (Addendum)
Anesthesia Evaluation  Patient identified by MRN, date of birth, ID band Patient awake    Reviewed: Allergy & Precautions, H&P , NPO status , Patient's Chart, lab work & pertinent test results  Airway Mallampati: II   Neck ROM: full    Dental   Pulmonary asthma , COPD, Current Smoker   breath sounds clear to auscultation       Cardiovascular hypertension, + Peripheral Vascular Disease   Rhythm:regular Rate:Normal  TTE (10/20/21): EF 50-55%   Neuro/Psych  PSYCHIATRIC DISORDERS Anxiety        GI/Hepatic ,GERD  ,,  Endo/Other  diabetes, Type 2    Renal/GU      Musculoskeletal  (+) Arthritis ,    Abdominal   Peds  Hematology   Anesthesia Other Findings   Reproductive/Obstetrics                             Anesthesia Physical Anesthesia Plan  ASA: 3  Anesthesia Plan: General   Post-op Pain Management:    Induction: Intravenous  PONV Risk Score and Plan: 2 and Ondansetron, Midazolam, Dexamethasone and Treatment may vary due to age or medical condition  Airway Management Planned: Oral ETT  Additional Equipment:   Intra-op Plan:   Post-operative Plan: Extubation in OR  Informed Consent: I have reviewed the patients History and Physical, chart, labs and discussed the procedure including the risks, benefits and alternatives for the proposed anesthesia with the patient or authorized representative who has indicated his/her understanding and acceptance.     Dental advisory given  Plan Discussed with: CRNA, Anesthesiologist and Surgeon  Anesthesia Plan Comments: (PAT note written 12/01/2022 by Shonna Chock, PA-C.  )       Anesthesia Quick Evaluation

## 2022-12-01 NOTE — Progress Notes (Signed)
Anesthesia APP PAT Evaluation:  Case: [id number] Date/Time: 12/08/22 1215   Procedure: PLIF - L3-L4 - Posterior Lateral and Interbody fusion   Anesthesia type: General   Pre-op diagnosis: Spondylolisthesis   Location: [facility] ROOM 21 / [facility]   Surgeons: Coletta Memos, MD       DISCUSSION: Patient is a 62 year old female scheduled for the above procedure.  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   She is followed by pulmonologist Dr. Jayme Cloud. Last visit was on 08/15/22. No significant desaturations with ambulatory oximetry. Dyspnea felt multifactorial including from obesity, deconditioning, diastolic CHF, and COPD with ongoing smoking. She uses nocturnal O2 at 2L which was prescribed within the past year for nocturnal desaturations. She referred to pulmonary rehab, but patient said she was not able to really consider this currently given her back issues. Follow-up around 3 months had been planned with repeat PFTs. Patient continues with Breztri BID and uses albuterol as needed. She is mostly smoking 1PPD now as a way to help deal with her pain.  She was followed by cardiology for chronic diastolic CHF and palpitations. Last visit was on 11/24/21 with Further, Cadence, PA-C. Studies include no evidence of CAD with coronary calcium score of 0 on 01/23/20. Long term monitor in May 2022 showed predominant SR with occasional PACs and PVCs. Most recent echo from 10/20/21 showed LVEF 50-55%, no regional wall motion abnormalities, grade II diastolic dysfunction, normal RV systolic function. Patient says chronic intermittent LE (LLE > RLE) edema is  managed with as needed Lasix. BP 125/79. Palpitations controlled on verapamil. Follow-up had been planned; however patient deferred follow-up as she has not been diagnosed with CAD and feels her breathing and volume status have been stable and are also monitored by her PCP and pulmonologist. She weighs daily.  She is followed by vascular surgeon Dr. Wyn Quaker for PAD with Blue Toe Syndrome. She is allergic to ASA, so she manages with keeping her feet warm. She denied any peripheral ulcers. She reported DM control has been good. A1c 6.6% on 08/02/22.   As above, she declined cardiology follow-up since no chest pain (no CAD by 2021 CCTA), and stable exertional dyspnea, and weights. She used to smoke 2 PPD, but is down to 1/2-1 PPD.  She has had significant back and BLE pain for the past 6-7 months. Prior to that, she and her sister would walk up and down her 1 mile drive ways up to twice a day. Currently, she is not as active due to her back/leg pain and fear of falling because her legs will "give out".  She still was able to walk independently down the hospital hallway. She denied chest pain, palpitations, SOB at rest. She does not report any significant DOE. No orthopnea (uses 2 pillows chronically for years.) She gets mild, intermittent edema and takes Lasix 20 mg daily as needed. She uses about twice a month. She is planning to take one on 12/01/22 because she ate [place] food on 11/30/22 and can tell her feet are a little swollen. She doesn't notice any changes in her breathing, but I did notice mild wheezing after she walked into the exam room, but improved within a few minutes and  lungs were clear on exam. If edema does not improve with her as needed Lasix or if breathing worsens, then she was told to get follow-up before surgery. She denied recent or frequent respiratory illness. She is compliant with Breztri. She uses albuterol MDI or nebulizer up to twice a week. She uses 2L/[place] at night for nocturnal  desaturations. She denied post-operative respiratory issues or ventilator.   Discussed above with anesthesiologist Arrie Aran, MD. No known CAD. She feels breathing is stable. Overall, volume status has been stable. Patient aware that she should be at baseline for surgery and should notify surgeon and appropriate provider should there be any acute changes. Anesthesia team will evaluate on the day of surgery.     VS: BP 125/79   Pulse 90   Temp 36.6 C   Resp 17   Ht 5\' 7"  (1.702 m)   Wt 97.8 kg   SpO2 95%   BMI 33.78 kg/m  Heart RRR, no murmur noted. Lung clear. Mild, up to 1 + non-pitting pedal edema. No carotid bruits noted. No coughing.    PROVIDERS: Marjie Skiff, NP is PCP [facility]) Sarina Ser, MD is pulmonologist Festus Barren, MD is vascular surgeon, last visit 06/17/22. Debbe Odea, MD is cardiologist.  Midge Minium, MD is GI   LABS: Labs reviewed: Acceptable for surgery. A1c 6.6% 08/02/22.  (all labs ordered are listed, but only abnormal results are displayed)  Labs Reviewed  BASIC METABOLIC PANEL - Abnormal; Notable for the following components:      Result Value   Calcium 8.8 (*)    All other components within normal limits  CBC - Abnormal; Notable for the following components:   WBC 11.3 (*)    All other components within normal limits  SURGICAL PCR SCREEN  GLUCOSE, CAPILLARY  TYPE AND SCREEN    OTHER:  Nocturnal Pulse Oximetry 11/15/21: High SpO2 97%. Low SpO2 76%. Basal SpO2 86.9%. Time < 88% 06:34:26. Nocturnal O2 at 2L/[place] ordered.  PFTs 10/27/20: FVC 2.33 (64%), post 2.56 (70%). FEV1 1.42 (50%), post 1.56 (55%). DLCO unc/cor 14.21 (64%).   IMAGES: MRI L-spine 11/17/22: IMPRESSION: 1. At L3-L4, severe canal stenosis and moderate bilateral foraminal stenosis. 2. L4-L5 PLIF.  CT Chest LCS 01/21/22: IMPRESSION: 1. Lung-RADS 2, benign appearance or behavior. Continue annual screening with low-dose chest CT without contrast  in 12 months. 2. Aortic Atherosclerosis (ICD10-I70.0) and Emphysema (ICD10-J43.9).   EKG: 12/01/22: Normal sinus rhythm Rightward axis Low voltage QRS Incomplete right bundle branch block Borderline ECG When compared with ECG of 25-Jul-2020 06:02,   CV: ABIs/TBIs 06/17/22: Summary:  - Right: Resting right ankle-brachial index is within normal range. The  right toe-brachial index is normal.  - Left: Resting left ankle-brachial index is within normal range. The left  toe-brachial index is normal.  - Bilateral ABIs and TBIs appear essentially unchanged compared to prior  study on 2020.    Echo 10/20/21: IMPRESSIONS   1. Left ventricular ejection fraction, by estimation, is 50 to 55%. The  left ventricle has low normal function. The left ventricle has no regional  wall motion abnormalities. Left ventricular diastolic parameters are  consistent with Grade II diastolic  dysfunction (pseudonormalization).   2. Right ventricular systolic function is normal. The right ventricular  size is normal. Moderately increased right ventricular wall thickness.   3. The mitral valve is normal in structure. No evidence of mitral valve  regurgitation.   4. The aortic valve was not well visualized. Aortic  valve regurgitation  is not visualized.   5. The inferior vena cava is normal in size with greater than 50%  respiratory variability, suggesting right atrial pressure of 3 mmHg.  - Comparison 09/17/20: LVEF 60-65%, no RWMA, grade 1 DD, normal RVSF, normal RV size.   Long Term Monitor 08/27/20 - 09/10/20: Patient had a min HR of 47 bpm, max HR of 174 bpm, and avg HR of 81 bpm. Predominant underlying rhythm was Sinus Rhythm.  Occasional PACs and PVCs noted, no significant arrhythmias noted. Isolated SVEs were rare (<1.0%), SVE Couplets were rare (<1.0%), and SVE Triplets were rare (<1.0%).  Patient triggered events associated with sinus rhythm.  No significant arrhythmias noted.   CT Coronary  01/23/20: IMPRESSION: 1. Coronary calcium score of 0. Patient is low risk for near term coronary events 2. Normal coronary origin with right dominance. 3. No evidence of CAD. 4. CAD-RADS 0. Consider non-atherosclerotic causes of chest pain.   Past Medical History:  Diagnosis Date   Asthma    Cancer (HCC)    stomach   Chronic back pain    COPD (chronic obstructive pulmonary disease) (HCC)    Degenerative joint disease (DJD) of hip    Diabetes mellitus without complication (HCC)    Dyspnea    GERD (gastroesophageal reflux disease)    takes Omeprazole daily   History of bronchitis yrs ago   History of colon polyps    benign   History of staph infection 10 yrs ago   Hyperlipidemia    takes Atorvastatin daily   Hypertension    Palpitations    Peripheral vascular disease (HCC)    Tobacco use    8 cigarettes daily    Weakness    numbness and tingling in both legs    Wears dentures    full upper    Past Surgical History:  Procedure Laterality Date   APPENDECTOMY     BACK SURGERY  9-10 yrs ago   BREAST BIOPSY Right    Core- neg   COLONOSCOPY     COLONOSCOPY WITH PROPOFOL N/A 09/20/2017   Procedure: COLONOSCOPY WITH PROPOFOL;  Surgeon: Pasty Spillers, MD;  Location: [facility] ENDOSCOPY;  Service: Endoscopy;  Laterality: N/A;   COLONOSCOPY WITH PROPOFOL N/A 04/24/2018   Procedure: COLONOSCOPY WITH PROPOFOL;  Surgeon: Midge Minium, MD;  Location: [facility] ENDOSCOPY;  Service: Endoscopy;  Laterality: N/A;   COLONOSCOPY WITH PROPOFOL N/A 06/10/2021   Procedure: COLONOSCOPY WITH BIOPSY;  Surgeon: Midge Minium, MD;  Location: [facility] SURGERY CNTR;  Service: Endoscopy;  Laterality: N/A;   ESOPHAGEAL DILATION  06/10/2021   Procedure: ESOPHAGEAL DILATION;  Surgeon: Midge Minium, MD;  Location: [facility] SURGERY CNTR;  Service: Endoscopy;;  15-18 mm   ESOPHAGOGASTRODUODENOSCOPY (EGD) WITH PROPOFOL N/A 09/20/2017   Procedure: ESOPHAGOGASTRODUODENOSCOPY (EGD) WITH PROPOFOL;  Surgeon:  Pasty Spillers, MD;  Location: [facility] ENDOSCOPY;  Service: Endoscopy;  Laterality: N/A;   ESOPHAGOGASTRODUODENOSCOPY (EGD) WITH PROPOFOL N/A 11/20/2018   Procedure: ESOPHAGOGASTRODUODENOSCOPY (EGD) WITH PROPOFOL;  Surgeon: Midge Minium, MD;  Location: [facility] ENDOSCOPY;  Service: Endoscopy;  Laterality: N/A;   ESOPHAGOGASTRODUODENOSCOPY (EGD) WITH PROPOFOL N/A 06/10/2021   Procedure: ESOPHAGOGASTRODUODENOSCOPY (EGD) WITH BIOPSY;  Surgeon: Midge Minium, MD;  Location: [facility] SURGERY CNTR;  Service: Endoscopy;  Laterality: N/A;  Diabetic Latex   EYE SURGERY     PERIPHERAL VASCULAR CATHETERIZATION Left 03/17/2016   Procedure: Lower Extremity Angiography;  Surgeon: Annice Needy, MD;  Location: [facility] INVASIVE CV LAB;  Service: Cardiovascular;  Laterality: Left;   POLYPECTOMY  N/A 06/10/2021   Procedure: POLYPECTOMY;  Surgeon: Midge Minium, MD;  Location: [facility] SURGERY CNTR;  Service: Endoscopy;  Laterality: N/A;   TOTAL ABDOMINAL HYSTERECTOMY W/ BILATERAL SALPINGOOPHORECTOMY     total   TOTAL SHOULDER ARTHROPLASTY Right 07/24/2019   Procedure: RIGHT TOTAL SHOULDER ARTHROPLASTY;  Surgeon: Lyndle Herrlich, MD;  Location: [facility] ORS;  Service: Orthopedics;  Laterality: Right;    MEDICATIONS:  Accu-Chek Softclix Lancets lancets   albuterol (PROVENTIL) (2.5 MG/3ML) 0.083% nebulizer solution   albuterol (VENTOLIN HFA) 108 (90 Base) MCG/ACT inhaler   Blood Glucose Monitoring Suppl (ACCU-CHEK GUIDE ME) w/Device KIT   Budeson-Glycopyrrol-Formoterol (BREZTRI AEROSPHERE) 160-9-4.8 MCG/ACT AERO   Cholecalciferol 25 MCG (1000 UT) capsule   cyanocobalamin (VITAMIN B12) 1000 MCG tablet   cyclobenzaprine (FLEXERIL) 10 MG tablet   DULoxetine (CYMBALTA) 60 MG capsule   furosemide (LASIX) 20 MG tablet   glucose blood (ACCU-CHEK GUIDE) test strip   hydrOXYzine (VISTARIL) 25 MG capsule   lidocaine (LIDODERM) 5 %   metFORMIN (GLUCOPHAGE) 500 MG tablet   olmesartan (BENICAR) 5 MG tablet   OXYGEN   pantoprazole  (PROTONIX) 40 MG tablet   pregabalin (LYRICA) 25 MG capsule   prochlorperazine (COMPAZINE) 10 MG tablet   rosuvastatin (CRESTOR) 40 MG tablet   sitaGLIPtin (JANUVIA) 100 MG tablet   traZODone (DESYREL) 100 MG tablet   verapamil (CALAN) 80 MG tablet   No current facility-administered medications for this encounter.    Shonna Chock, PA-C Surgical Short Stay/Anesthesiology [facility] Phone [phone] [facility] Phone [phone] 12/01/2022 7:07 PM

## 2022-12-01 NOTE — Progress Notes (Signed)
PCP - Aura Dials, NP Cardiologist - Dr. Debbe Odea (last saw 11/24/21)  PPM/ICD - denies   Chest x-ray - 07/25/20 EKG - 12/01/22 Stress Test - denies ECHO - 10/20/21 Cardiac Cath - denies  Sleep Study - denies   Fasting Blood Sugar - 130-140 Checks Blood Sugar once a day  Last dose of GLP1 agonist-  n/a   ASA/Blood Thinner Instructions: n/a   ERAS Protcol - yes, no drink   COVID TEST- n/a   Anesthesia review: yes, cardiac hx. Revonda Standard saw pt in PAT  Patient denies shortness of breath, fever, cough and chest pain at PAT appointment   All instructions explained to the patient, with a verbal understanding of the material. Patient agrees to go over the instructions while at home for a better understanding. The opportunity to ask questions was provided.

## 2022-12-02 DIAGNOSIS — J449 Chronic obstructive pulmonary disease, unspecified: Secondary | ICD-10-CM | POA: Diagnosis not present

## 2022-12-02 DIAGNOSIS — J9611 Chronic respiratory failure with hypoxia: Secondary | ICD-10-CM | POA: Diagnosis not present

## 2022-12-03 DIAGNOSIS — J449 Chronic obstructive pulmonary disease, unspecified: Secondary | ICD-10-CM | POA: Diagnosis not present

## 2022-12-03 DIAGNOSIS — J9611 Chronic respiratory failure with hypoxia: Secondary | ICD-10-CM | POA: Diagnosis not present

## 2022-12-06 ENCOUNTER — Other Ambulatory Visit: Payer: Self-pay | Admitting: Nurse Practitioner

## 2022-12-08 ENCOUNTER — Ambulatory Visit (HOSPITAL_COMMUNITY): Payer: 59 | Admitting: Vascular Surgery

## 2022-12-08 ENCOUNTER — Ambulatory Visit (HOSPITAL_BASED_OUTPATIENT_CLINIC_OR_DEPARTMENT_OTHER): Payer: 59 | Admitting: Anesthesiology

## 2022-12-08 ENCOUNTER — Other Ambulatory Visit: Payer: Self-pay

## 2022-12-08 ENCOUNTER — Encounter (HOSPITAL_COMMUNITY)
Admission: RE | Disposition: A | Discharge status: Home / Self Care | Payer: Self-pay | Source: Ambulatory Visit | Attending: Neurosurgery

## 2022-12-08 ENCOUNTER — Encounter (HOSPITAL_COMMUNITY): Payer: Self-pay | Admitting: Neurosurgery

## 2022-12-08 ENCOUNTER — Ambulatory Visit (HOSPITAL_COMMUNITY): Payer: 59

## 2022-12-08 ENCOUNTER — Ambulatory Visit (HOSPITAL_COMMUNITY)
Admission: RE | Admit: 2022-12-08 | Discharge: 2022-12-09 | Disposition: A | Discharge status: Home / Self Care | Payer: 59 | Source: Ambulatory Visit | Attending: Neurosurgery | Admitting: Neurosurgery

## 2022-12-08 DIAGNOSIS — J449 Chronic obstructive pulmonary disease, unspecified: Secondary | ICD-10-CM | POA: Diagnosis not present

## 2022-12-08 DIAGNOSIS — M48062 Spinal stenosis, lumbar region with neurogenic claudication: Secondary | ICD-10-CM

## 2022-12-08 DIAGNOSIS — Z0189 Encounter for other specified special examinations: Secondary | ICD-10-CM | POA: Diagnosis not present

## 2022-12-08 DIAGNOSIS — F17210 Nicotine dependence, cigarettes, uncomplicated: Secondary | ICD-10-CM | POA: Diagnosis not present

## 2022-12-08 DIAGNOSIS — Z7984 Long term (current) use of oral hypoglycemic drugs: Secondary | ICD-10-CM | POA: Diagnosis not present

## 2022-12-08 DIAGNOSIS — E1151 Type 2 diabetes mellitus with diabetic peripheral angiopathy without gangrene: Secondary | ICD-10-CM | POA: Insufficient documentation

## 2022-12-08 DIAGNOSIS — I110 Hypertensive heart disease with heart failure: Secondary | ICD-10-CM | POA: Diagnosis not present

## 2022-12-08 DIAGNOSIS — M4316 Spondylolisthesis, lumbar region: Secondary | ICD-10-CM | POA: Diagnosis not present

## 2022-12-08 DIAGNOSIS — I5032 Chronic diastolic (congestive) heart failure: Secondary | ICD-10-CM | POA: Diagnosis not present

## 2022-12-08 LAB — GLUCOSE, CAPILLARY
Glucose-Capillary: 122 mg/dL — ABNORMAL HIGH (ref 70–99)
Glucose-Capillary: 100 mg/dL — ABNORMAL HIGH (ref 70–99)
Glucose-Capillary: 172 mg/dL — ABNORMAL HIGH (ref 70–99)

## 2022-12-08 SURGERY — POSTERIOR LUMBAR FUSION 1 LEVEL
Anesthesia: General | Site: Spine Lumbar

## 2022-12-08 MED ORDER — THROMBIN 20000 UNITS EX SOLR
CUTANEOUS | Status: AC
  Filled 2022-12-08: qty 20000

## 2022-12-08 MED ORDER — LACTATED RINGERS IV SOLN: INTRAVENOUS | Status: DC

## 2022-12-08 MED ORDER — FENTANYL CITRATE (PF) 100 MCG/2ML IJ SOLN
25.0000 ug | INTRAMUSCULAR | Status: DC | PRN
  Administered 2022-12-08: 25 ug via INTRAVENOUS

## 2022-12-08 MED ORDER — OXYCODONE HCL 5 MG PO TABS: 10.0000 mg | ORAL_TABLET | ORAL | Status: DC | PRN

## 2022-12-08 MED ORDER — DEXAMETHASONE SODIUM PHOSPHATE 10 MG/ML IJ SOLN
INTRAMUSCULAR | Status: DC | PRN
  Administered 2022-12-08: 10 mg via INTRAVENOUS

## 2022-12-08 MED ORDER — HYDROXYZINE HCL 10 MG PO TABS: 25.0000 mg | ORAL_TABLET | Freq: Three times a day (TID) | ORAL | Status: DC | PRN

## 2022-12-08 MED ORDER — CHLORHEXIDINE GLUCONATE 0.12 % MT SOLN
15.0000 mL | Freq: Once | OROMUCOSAL | Status: AC
  Administered 2022-12-08: 15 mL via OROMUCOSAL
  Filled 2022-12-08: qty 15

## 2022-12-08 MED ORDER — LIDOCAINE 2% (20 MG/ML) 5 ML SYRINGE
INTRAMUSCULAR | Status: DC | PRN
  Administered 2022-12-08: 60 mg via INTRAVENOUS

## 2022-12-08 MED ORDER — UMECLIDINIUM BROMIDE 62.5 MCG/ACT IN AEPB
1.0000 | INHALATION_SPRAY | Freq: Every day | RESPIRATORY_TRACT | Status: DC
  Filled 2022-12-08: qty 7

## 2022-12-08 MED ORDER — OXYCODONE HCL 5 MG/5ML PO SOLN: 5.0000 mg | Freq: Once | ORAL | Status: AC | PRN

## 2022-12-08 MED ORDER — ZOLPIDEM TARTRATE 5 MG PO TABS: 5.0000 mg | ORAL_TABLET | Freq: Every evening | ORAL | Status: DC | PRN

## 2022-12-08 MED ORDER — ALBUTEROL SULFATE HFA 108 (90 BASE) MCG/ACT IN AERS: 2.0000 | INHALATION_SPRAY | Freq: Four times a day (QID) | RESPIRATORY_TRACT | Status: DC | PRN

## 2022-12-08 MED ORDER — PREGABALIN 25 MG PO CAPS
25.0000 mg | ORAL_CAPSULE | Freq: Two times a day (BID) | ORAL | Status: DC
  Administered 2022-12-08 – 2022-12-09 (×2): 25 mg via ORAL
  Filled 2022-12-08 (×2): qty 1

## 2022-12-08 MED ORDER — MAGNESIUM CITRATE PO SOLN: 1.0000 | Freq: Once | ORAL | Status: DC | PRN

## 2022-12-08 MED ORDER — MIDAZOLAM HCL 2 MG/2ML IJ SOLN
INTRAMUSCULAR | Status: DC | PRN
  Administered 2022-12-08: 2 mg via INTRAVENOUS

## 2022-12-08 MED ORDER — FENTANYL CITRATE (PF) 100 MCG/2ML IJ SOLN
INTRAMUSCULAR | Status: AC
  Filled 2022-12-08: qty 2

## 2022-12-08 MED ORDER — ROSUVASTATIN CALCIUM 20 MG PO TABS
40.0000 mg | ORAL_TABLET | Freq: Every day | ORAL | Status: DC
  Administered 2022-12-08 – 2022-12-09 (×2): 40 mg via ORAL
  Filled 2022-12-08 (×2): qty 2

## 2022-12-08 MED ORDER — PHENYLEPHRINE 80 MCG/ML (10ML) SYRINGE FOR IV PUSH (FOR BLOOD PRESSURE SUPPORT)
PREFILLED_SYRINGE | INTRAVENOUS | Status: DC | PRN
  Administered 2022-12-08: 160 ug via INTRAVENOUS

## 2022-12-08 MED ORDER — TRAZODONE HCL 50 MG PO TABS
100.0000 mg | ORAL_TABLET | Freq: Every day | ORAL | Status: DC
  Administered 2022-12-08: 100 mg via ORAL
  Filled 2022-12-08: qty 2

## 2022-12-08 MED ORDER — SODIUM CHLORIDE 0.9% FLUSH
3.0000 mL | Freq: Two times a day (BID) | INTRAVENOUS | Status: DC
  Administered 2022-12-08: 3 mL via INTRAVENOUS

## 2022-12-08 MED ORDER — CEFAZOLIN SODIUM-DEXTROSE 2-4 GM/100ML-% IV SOLN
2.0000 g | INTRAVENOUS | Status: AC
  Administered 2022-12-08: 2 g via INTRAVENOUS
  Filled 2022-12-08: qty 100

## 2022-12-08 MED ORDER — BISACODYL 5 MG PO TBEC: 5.0000 mg | DELAYED_RELEASE_TABLET | Freq: Every day | ORAL | Status: DC | PRN

## 2022-12-08 MED ORDER — PHENOL 1.4 % MT LIQD: 1.0000 | OROMUCOSAL | Status: DC | PRN

## 2022-12-08 MED ORDER — THROMBIN 20000 UNITS EX SOLR
CUTANEOUS | Status: DC | PRN
  Administered 2022-12-08: 20 mL via TOPICAL

## 2022-12-08 MED ORDER — 0.9 % SODIUM CHLORIDE (POUR BTL) OPTIME
TOPICAL | Status: DC | PRN
  Administered 2022-12-08: 1000 mL

## 2022-12-08 MED ORDER — ONDANSETRON HCL 4 MG/2ML IJ SOLN
INTRAMUSCULAR | Status: DC | PRN
  Administered 2022-12-08: 4 mg via INTRAVENOUS

## 2022-12-08 MED ORDER — SUGAMMADEX SODIUM 200 MG/2ML IV SOLN
INTRAVENOUS | Status: DC | PRN
  Administered 2022-12-08: 200 mg via INTRAVENOUS

## 2022-12-08 MED ORDER — OXYCODONE HCL 5 MG PO TABS
5.0000 mg | ORAL_TABLET | Freq: Once | ORAL | Status: AC | PRN
  Administered 2022-12-08: 5 mg via ORAL

## 2022-12-08 MED ORDER — LINAGLIPTIN 5 MG PO TABS
5.0000 mg | ORAL_TABLET | Freq: Every day | ORAL | Status: DC
  Administered 2022-12-08 – 2022-12-09 (×2): 5 mg via ORAL
  Filled 2022-12-08 (×2): qty 1

## 2022-12-08 MED ORDER — FENTANYL CITRATE (PF) 250 MCG/5ML IJ SOLN
INTRAMUSCULAR | Status: DC | PRN
  Administered 2022-12-08: 50 ug via INTRAVENOUS
  Administered 2022-12-08: 100 ug via INTRAVENOUS
  Administered 2022-12-08 (×2): 50 ug via INTRAVENOUS

## 2022-12-08 MED ORDER — ONDANSETRON HCL 4 MG/2ML IJ SOLN: 4.0000 mg | Freq: Four times a day (QID) | INTRAMUSCULAR | Status: DC | PRN

## 2022-12-08 MED ORDER — POTASSIUM CHLORIDE IN NACL 20-0.9 MEQ/L-% IV SOLN: INTRAVENOUS | Status: DC

## 2022-12-08 MED ORDER — SENNOSIDES-DOCUSATE SODIUM 8.6-50 MG PO TABS: 1.0000 | ORAL_TABLET | Freq: Every evening | ORAL | Status: DC | PRN

## 2022-12-08 MED ORDER — PROCHLORPERAZINE MALEATE 10 MG PO TABS: 10.0000 mg | ORAL_TABLET | Freq: Four times a day (QID) | ORAL | Status: DC | PRN

## 2022-12-08 MED ORDER — METFORMIN HCL 500 MG PO TABS
500.0000 mg | ORAL_TABLET | Freq: Every day | ORAL | Status: DC
  Administered 2022-12-09: 500 mg via ORAL
  Filled 2022-12-08: qty 1

## 2022-12-08 MED ORDER — MORPHINE SULFATE (PF) 2 MG/ML IV SOLN: 2.0000 mg | INTRAVENOUS | Status: DC | PRN

## 2022-12-08 MED ORDER — IRBESARTAN 75 MG PO TABS
37.5000 mg | ORAL_TABLET | Freq: Every day | ORAL | Status: DC
  Filled 2022-12-08: qty 1

## 2022-12-08 MED ORDER — SODIUM CHLORIDE 0.9 % IV SOLN: 250.0000 mL | INTRAVENOUS | Status: DC

## 2022-12-08 MED ORDER — CHLORHEXIDINE GLUCONATE CLOTH 2 % EX PADS: 6.0000 | MEDICATED_PAD | Freq: Once | CUTANEOUS | Status: DC

## 2022-12-08 MED ORDER — BUPIVACAINE HCL (PF) 0.5 % IJ SOLN
INTRAMUSCULAR | Status: DC | PRN
  Administered 2022-12-08: 30 mL

## 2022-12-08 MED ORDER — VITAMIN B-12 1000 MCG PO TABS
1000.0000 ug | ORAL_TABLET | Freq: Every day | ORAL | Status: DC
  Administered 2022-12-09: 1000 ug via ORAL
  Filled 2022-12-08: qty 1

## 2022-12-08 MED ORDER — ROCURONIUM BROMIDE 10 MG/ML (PF) SYRINGE
PREFILLED_SYRINGE | INTRAVENOUS | Status: AC
  Filled 2022-12-08: qty 10

## 2022-12-08 MED ORDER — FENTANYL CITRATE (PF) 250 MCG/5ML IJ SOLN
INTRAMUSCULAR | Status: AC
  Filled 2022-12-08: qty 5

## 2022-12-08 MED ORDER — ORAL CARE MOUTH RINSE: 15.0000 mL | Freq: Once | OROMUCOSAL | Status: AC

## 2022-12-08 MED ORDER — OXYCODONE HCL 5 MG PO TABS
ORAL_TABLET | ORAL | Status: AC
  Filled 2022-12-08: qty 1

## 2022-12-08 MED ORDER — INSULIN ASPART 100 UNIT/ML IJ SOLN: 0.0000 [IU] | INTRAMUSCULAR | Status: DC | PRN

## 2022-12-08 MED ORDER — FUROSEMIDE 20 MG PO TABS
20.0000 mg | ORAL_TABLET | Freq: Every day | ORAL | Status: DC
  Filled 2022-12-08: qty 1

## 2022-12-08 MED ORDER — OXYCODONE HCL 5 MG PO TABS
5.0000 mg | ORAL_TABLET | ORAL | Status: DC | PRN
  Administered 2022-12-09 (×3): 5 mg via ORAL
  Filled 2022-12-08 (×3): qty 1

## 2022-12-08 MED ORDER — BUPIVACAINE HCL (PF) 0.5 % IJ SOLN
INTRAMUSCULAR | Status: AC
  Filled 2022-12-08: qty 30

## 2022-12-08 MED ORDER — SODIUM CHLORIDE 0.9% FLUSH: 3.0000 mL | INTRAVENOUS | Status: DC | PRN

## 2022-12-08 MED ORDER — PROPOFOL 10 MG/ML IV BOLUS
INTRAVENOUS | Status: AC
  Filled 2022-12-08: qty 20

## 2022-12-08 MED ORDER — LACTATED RINGERS IV SOLN
INTRAVENOUS | Status: DC | PRN
Start: 2022-12-08 — End: 2022-12-08

## 2022-12-08 MED ORDER — PANTOPRAZOLE SODIUM 40 MG PO TBEC
40.0000 mg | DELAYED_RELEASE_TABLET | Freq: Every day | ORAL | Status: DC
  Administered 2022-12-08 – 2022-12-09 (×2): 40 mg via ORAL
  Filled 2022-12-08 (×2): qty 1

## 2022-12-08 MED ORDER — VITAMIN D 25 MCG (1000 UNIT) PO TABS
2000.0000 [IU] | ORAL_TABLET | Freq: Every day | ORAL | Status: DC
  Administered 2022-12-09: 2000 [IU] via ORAL
  Filled 2022-12-08 (×3): qty 2

## 2022-12-08 MED ORDER — FLUTICASONE FUROATE-VILANTEROL 200-25 MCG/ACT IN AEPB
1.0000 | INHALATION_SPRAY | Freq: Every day | RESPIRATORY_TRACT | Status: DC
  Filled 2022-12-08: qty 28

## 2022-12-08 MED ORDER — ROCURONIUM BROMIDE 10 MG/ML (PF) SYRINGE
PREFILLED_SYRINGE | INTRAVENOUS | Status: DC | PRN
  Administered 2022-12-08: 20 mg via INTRAVENOUS
  Administered 2022-12-08: 50 mg via INTRAVENOUS

## 2022-12-08 MED ORDER — SENNA 8.6 MG PO TABS
1.0000 | ORAL_TABLET | Freq: Two times a day (BID) | ORAL | Status: DC
  Administered 2022-12-08 – 2022-12-09 (×2): 8.6 mg via ORAL
  Filled 2022-12-08 (×2): qty 1

## 2022-12-08 MED ORDER — ALBUTEROL SULFATE (2.5 MG/3ML) 0.083% IN NEBU: 2.5000 mg | INHALATION_SOLUTION | Freq: Four times a day (QID) | RESPIRATORY_TRACT | Status: DC | PRN

## 2022-12-08 MED ORDER — DEXAMETHASONE SODIUM PHOSPHATE 10 MG/ML IJ SOLN
INTRAMUSCULAR | Status: AC
  Filled 2022-12-08: qty 1

## 2022-12-08 MED ORDER — LIDOCAINE-EPINEPHRINE 0.5 %-1:200000 IJ SOLN
INTRAMUSCULAR | Status: DC | PRN
  Administered 2022-12-08: 20 mL

## 2022-12-08 MED ORDER — MIDAZOLAM HCL 2 MG/2ML IJ SOLN
INTRAMUSCULAR | Status: AC
  Filled 2022-12-08: qty 2

## 2022-12-08 MED ORDER — VERAPAMIL HCL 40 MG PO TABS
80.0000 mg | ORAL_TABLET | Freq: Every day | ORAL | Status: DC
  Administered 2022-12-08: 80 mg via ORAL
  Filled 2022-12-08: qty 2
  Filled 2022-12-08: qty 1
  Filled 2022-12-08: qty 2

## 2022-12-08 MED ORDER — BUDESON-GLYCOPYRROL-FORMOTEROL 160-9-4.8 MCG/ACT IN AERO: 2.0000 | INHALATION_SPRAY | RESPIRATORY_TRACT | Status: DC

## 2022-12-08 MED ORDER — PROPOFOL 10 MG/ML IV BOLUS
INTRAVENOUS | Status: DC | PRN
  Administered 2022-12-08: 120 mg via INTRAVENOUS

## 2022-12-08 MED ORDER — LIDOCAINE-EPINEPHRINE 0.5 %-1:200000 IJ SOLN
INTRAMUSCULAR | Status: AC
  Filled 2022-12-08: qty 50

## 2022-12-08 MED ORDER — DULOXETINE HCL 30 MG PO CPEP
60.0000 mg | ORAL_CAPSULE | Freq: Every day | ORAL | Status: DC
  Administered 2022-12-08: 60 mg via ORAL
  Filled 2022-12-08 (×2): qty 2

## 2022-12-08 MED ORDER — OXYCODONE HCL ER 10 MG PO T12A
10.0000 mg | EXTENDED_RELEASE_TABLET | Freq: Two times a day (BID) | ORAL | Status: DC
  Administered 2022-12-08: 10 mg via ORAL
  Filled 2022-12-08: qty 1

## 2022-12-08 MED ORDER — MENTHOL 3 MG MT LOZG: 1.0000 | LOZENGE | OROMUCOSAL | Status: DC | PRN

## 2022-12-08 MED ORDER — ONDANSETRON HCL 4 MG PO TABS: 4.0000 mg | ORAL_TABLET | Freq: Four times a day (QID) | ORAL | Status: DC | PRN

## 2022-12-08 MED ORDER — ONDANSETRON HCL 4 MG/2ML IJ SOLN
INTRAMUSCULAR | Status: AC
  Filled 2022-12-08: qty 2

## 2022-12-08 MED ORDER — DIAZEPAM 5 MG PO TABS: 5.0000 mg | ORAL_TABLET | Freq: Four times a day (QID) | ORAL | Status: DC | PRN

## 2022-12-08 MED ORDER — LIDOCAINE 2% (20 MG/ML) 5 ML SYRINGE
INTRAMUSCULAR | Status: AC
  Filled 2022-12-08: qty 5

## 2022-12-08 SURGICAL SUPPLY — 68 items
ADH SKN CLS APL DERMABOND .7 (GAUZE/BANDAGES/DRESSINGS) ×1
APL SKNCLS STERI-STRIP NONHPOA (GAUZE/BANDAGES/DRESSINGS)
BAG COUNTER SPONGE SURGICOUNT (BAG) ×1 IMPLANT
BAG SPNG CNTER NS LX DISP (BAG) ×1
BASKET BONE COLLECTION (BASKET) ×1 IMPLANT
BENZOIN TINCTURE PRP APPL 2/3 (GAUZE/BANDAGES/DRESSINGS) IMPLANT
BLADE BONE MILL MEDIUM (MISCELLANEOUS) ×1 IMPLANT
BLADE CLIPPER SURG (BLADE) IMPLANT
BUR MATCHSTICK NEURO 3.0 LAGG (BURR) ×1 IMPLANT
BUR PRECISION FLUTE 5.0 (BURR) ×1 IMPLANT
CANISTER SUCT 3000ML PPV (MISCELLANEOUS) ×1 IMPLANT
CAP LCK SPNE (Orthopedic Implant) ×2 IMPLANT
CAP LOCK SPINE RADIUS (Orthopedic Implant) IMPLANT
CAP LOCKING (Orthopedic Implant) ×2 IMPLANT
CATH FOLEY LATEX FREE 16FR (CATHETERS) ×1
CATH FOLEY LF 16FR (CATHETERS) IMPLANT
CNTNR URN SCR LID CUP LEK RST (MISCELLANEOUS) ×1 IMPLANT
CONT SPEC 4OZ STRL OR WHT (MISCELLANEOUS) ×1
COVER BACK TABLE 60X90IN (DRAPES) ×1 IMPLANT
DERMABOND ADVANCED .7 DNX12 (GAUZE/BANDAGES/DRESSINGS) ×1 IMPLANT
DRAPE C-ARM 42X72 X-RAY (DRAPES) ×2 IMPLANT
DRAPE C-ARMOR (DRAPES) IMPLANT
DRAPE LAPAROTOMY 100X72X124 (DRAPES) ×1 IMPLANT
DRAPE SURG 17X23 STRL (DRAPES) ×1 IMPLANT
DRSG OPSITE POSTOP 4X8 (GAUZE/BANDAGES/DRESSINGS) IMPLANT
DURAPREP 26ML APPLICATOR (WOUND CARE) ×1 IMPLANT
ELECT REM PT RETURN 9FT ADLT (ELECTROSURGICAL) ×1
ELECTRODE REM PT RTRN 9FT ADLT (ELECTROSURGICAL) ×1 IMPLANT
GAUZE 4X4 16PLY ~~LOC~~+RFID DBL (SPONGE) IMPLANT
GAUZE SPONGE 4X4 12PLY STRL (GAUZE/BANDAGES/DRESSINGS) IMPLANT
GLOVE EXAM NITRILE XL STR (GLOVE) IMPLANT
GLOVE SURG LTX SZ6.5 (GLOVE) ×2 IMPLANT
GLOVE SURG SS PI 6.5 STRL IVOR (GLOVE) IMPLANT
GOWN STRL REUS W/ TWL LRG LVL3 (GOWN DISPOSABLE) ×2 IMPLANT
GOWN STRL REUS W/ TWL XL LVL3 (GOWN DISPOSABLE) IMPLANT
GOWN STRL REUS W/TWL 2XL LVL3 (GOWN DISPOSABLE) IMPLANT
GOWN STRL REUS W/TWL LRG LVL3 (GOWN DISPOSABLE) ×2
GOWN STRL REUS W/TWL XL LVL3 (GOWN DISPOSABLE)
KIT BASIN OR (CUSTOM PROCEDURE TRAY) ×1 IMPLANT
KIT POSITION SURG JACKSON T1 (MISCELLANEOUS) ×1 IMPLANT
KIT TURNOVER KIT B (KITS) ×1 IMPLANT
MILL BONE PREP (MISCELLANEOUS) IMPLANT
NDL HYPO 25X1 1.5 SAFETY (NEEDLE) ×1 IMPLANT
NDL SPNL 18GX3.5 QUINCKE PK (NEEDLE) IMPLANT
NEEDLE HYPO 25X1 1.5 SAFETY (NEEDLE) ×1 IMPLANT
NEEDLE SPNL 18GX3.5 QUINCKE PK (NEEDLE) IMPLANT
NS IRRIG 1000ML POUR BTL (IV SOLUTION) ×1 IMPLANT
PACK LAMINECTOMY NEURO (CUSTOM PROCEDURE TRAY) ×1 IMPLANT
PAD ARMBOARD 7.5X6 YLW CONV (MISCELLANEOUS) ×2 IMPLANT
ROD CURVED 45MM (Rod) IMPLANT
ROD CURVED 5.5X50 (Rod) IMPLANT
SCREW PA THRD 6.5X35 (Screw) IMPLANT
SET SCREW (Screw) ×2 IMPLANT
SET SCREW VRST (Screw) IMPLANT
SPACER HALF DOME 9X24X9.5 10D (Spacer) IMPLANT
SPIKE FLUID TRANSFER (MISCELLANEOUS) ×1 IMPLANT
SPONGE SURGIFOAM ABS GEL 100 (HEMOSTASIS) ×1 IMPLANT
SPONGE T-LAP 4X18 ~~LOC~~+RFID (SPONGE) IMPLANT
STRIP CLOSURE SKIN 1/2X4 (GAUZE/BANDAGES/DRESSINGS) IMPLANT
SUT PROLENE 6 0 BV (SUTURE) IMPLANT
SUT VIC AB 0 CT1 18XCR BRD8 (SUTURE) ×1 IMPLANT
SUT VIC AB 0 CT1 8-18 (SUTURE) ×1
SUT VIC AB 2-0 CT1 18 (SUTURE) ×1 IMPLANT
SUT VIC AB 3-0 SH 8-18 (SUTURE) ×1 IMPLANT
TOWEL GREEN STERILE (TOWEL DISPOSABLE) ×1 IMPLANT
TOWEL GREEN STERILE FF (TOWEL DISPOSABLE) ×1 IMPLANT
TRAY FOLEY MTR SLVR 16FR STAT (SET/KITS/TRAYS/PACK) ×1 IMPLANT
WATER STERILE IRR 1000ML POUR (IV SOLUTION) ×1 IMPLANT

## 2022-12-08 NOTE — Telephone Encounter (Signed)
Requested Prescriptions  Pending Prescriptions Disp Refills   pantoprazole (PROTONIX) 40 MG tablet [Pharmacy Med Name: PANTOPRAZOLE SODIUM 40 MG DR TAB] 90 tablet 3    Sig: TAKE 1 TABLET BY MOUTH DAILY     Gastroenterology: Proton Pump Inhibitors Passed - 12/06/2022 11:04 AM      Passed - Valid encounter within last 12 months    Recent Outpatient Visits           1 month ago Type 2 diabetes, controlled, with peripheral circulatory disorder (HCC)   [place] [facility] [place], [place] T, NP   2 months ago Chronic bilateral low back pain without sciatica   [place] [facility] [place], [place] T, NP   3 months ago Hypertension associated with diabetes (HCC)   [place] [facility] [place], [place] T, NP   4 months ago Type 2 diabetes, controlled, with peripheral circulatory disorder (HCC)   [place] [facility] [place], Jolene T, NP   7 months ago Type 2 diabetes, controlled, with peripheral circulatory disorder (HCC)   [place] [facility] [place], Dorie Rank, NP       Future Appointments             In 1 month [place], Dorie Rank, NP [facility]

## 2022-12-08 NOTE — Transfer of Care (Signed)
Immediate Anesthesia Transfer of Care Note  Patient: Stacey Gross  Procedure(s) Performed: Posterior Lumbar Interbody Fusion - Lumbar three-Lumbar four - Posterior Lateral and Interbody fusion (Spine Lumbar)  Patient Location: PACU  Anesthesia Type:General  Level of Consciousness: drowsy  Airway & Oxygen Therapy: Patient Spontanous Breathing and Patient connected to face mask oxygen  Post-op Assessment: Report given to RN and Post -op Vital signs reviewed and stable  Post vital signs: stable  Last Vitals:  Vitals Value Taken Time  BP    Temp    Pulse 94 12/08/22 1815  Resp 11 12/08/22 1815  SpO2 94 % 12/08/22 1815  Vitals shown include unfiled device data.  Last Pain:  Vitals:   12/08/22 1056  PainSc: 9       Patients Stated Pain Goal: 1 (12/08/22 1056)  Complications: No notable events documented.

## 2022-12-08 NOTE — H&P (Signed)
BP (!) 155/85   Pulse 83   Temp 98.4 F (36.9 C)   Resp 18   Ht 5\' 7"  (1.702 m)   Wt 97.1 kg   SpO2 94%   BMI 33.52 kg/m    Stacey Gross is an old patient of mine who I operated on in 2016 I believe for neurogenic claudication.  She comes in today with a story consistent with neurogenic claudication.  It hurts when she stands, hurts when she walks.  She cannot make dinner for herself.  She cannot wash the dishes.  She uses a walker.  She will lean over a counter to gain relief.  She will push a shopping cart when she is in a store.  Says the pain is in both lower extremities, starts in her back, very severe.  If she is in bed and tries to roll over, it hurts also.  She has had a number of falls recently secondary to the pain and weakness that she describes.  She weighs 211 pounds.  Pain is 8/10.  She is alert and oriented by 4.  She answers all questions appropriately.  Memory, language, attention span, and fund of knowledge are normal.  Speech is clear and fluent.  Tongue and uvula midline.  Shoulder shrug is normal.  Hearing intact to voice. Allergies  Allergen Reactions   Doxycycline Hyclate Anaphylaxis, Swelling and Rash   Acetaminophen Swelling    Tongue swelling and breaks out in welts   Aspirin Swelling    Tongue swelling and breaks out in welts   Latex Rash    Gloves, underwear elastic, tape   Vancomycin Rash    All mycins   Family History  Problem Relation Age of Onset   Hyperlipidemia Mother    Hyperlipidemia Father    Melanoma Sister    Breast cancer Neg Hx    Social History   Socioeconomic History   Marital status: Widowed    Spouse name: Not on file   Number of children: 0   Years of education: 8   Highest education level: Not on file  Occupational History   Occupation: disabled  Tobacco Use   Smoking status: Every Day    Current packs/day: 2.00    Average packs/day: 2.0 packs/day for 48.0 years (96.0 ttl pk-yrs)    Types: Cigarettes   Smokeless tobacco:  Never   Tobacco comments:    1 PPD 12/01/22  Vaping Use   Vaping status: Never Used  Substance and Sexual Activity   Alcohol use: No   Drug use: No   Sexual activity: Not on file  Other Topics Concern   Not on file  Social History Narrative   Not on file   Social Determinants of Health   Financial Resource Strain: Low Risk  (05/23/2022)   Overall Financial Resource Strain (CARDIA)    Difficulty of Paying Living Expenses: Not hard at all  Food Insecurity: No Food Insecurity (06/03/2022)   Hunger Vital Sign    Worried About Running Out of Food in the Last Year: Never true    Ran Out of Food in the Last Year: Never true  Transportation Needs: No Transportation Needs (06/03/2022)   PRAPARE - Administrator, Civil Service (Medical): No    Lack of Transportation (Non-Medical): No  Physical Activity: Inactive (05/23/2022)   Exercise Vital Sign    Days of Exercise per Week: 0 days    Minutes of Exercise per Session: 0 min  Stress: No Stress  Concern Present (05/23/2022)   [employer] of Occupational Health - Occupational Stress Questionnaire    Feeling of Stress : Not at all  Social Connections: Socially Isolated (05/23/2022)   Social Connection and Isolation Panel [NHANES]    Frequency of Communication with Friends and Family: More than three times a week    Frequency of Social Gatherings with Friends and Family: More than three times a week    Attends Religious Services: Never    Database administrator or Organizations: No    Attends Banker Meetings: Never    Marital Status: Divorced  Catering manager Violence: Not At Risk (05/23/2022)   Humiliation, Afraid, Rape, and Kick questionnaire    Fear of Current or Ex-Partner: No    Emotionally Abused: No    Physically Abused: No    Sexually Abused: No   Prior to Admission medications   Medication Sig Start Date End Date Taking? Authorizing Provider  Budeson-Glycopyrrol-Formoterol (BREZTRI AEROSPHERE)  160-9-4.8 MCG/ACT AERO Inhale 2 puffs into the lungs in the morning and at bedtime. 08/02/22  Yes Cannady, Corrie Dandy T, NP  Cholecalciferol 25 MCG (1000 UT) capsule Take 2,000 Units by mouth daily.   Yes [provider]  cyanocobalamin (VITAMIN B12) 1000 MCG tablet Take 1 tablet (1,000 mcg total) by mouth daily. 05/04/22  Yes Cannady, Jolene T, NP  cyclobenzaprine (FLEXERIL) 10 MG tablet Take 10 mg by mouth 3 (three) times daily as needed for muscle spasms.   Yes [provider]  DULoxetine (CYMBALTA) 60 MG capsule Take 1 capsule (60 mg total) by mouth daily. 09/01/22  Yes Cannady, Jolene T, NP  furosemide (LASIX) 20 MG tablet TAKE 1 TABLET EVERY DAY AS NEEDED 12/29/21  Yes Agbor-Etang, Arlys John, MD  hydrOXYzine (VISTARIL) 25 MG capsule Take 1 capsule (25 mg total) by mouth every 8 (eight) hours as needed. 11/01/22  Yes Cannady, Jolene T, NP  metFORMIN (GLUCOPHAGE) 500 MG tablet Take 1 tablet (500 mg total) by mouth 2 (two) times daily. Patient taking differently: Take 500 mg by mouth daily. 08/02/22  Yes Cannady, Jolene T, NP  olmesartan (BENICAR) 5 MG tablet TAKE 1 TABLET BY MOUTH DAILY 10/25/22  Yes Cannady, Jolene T, NP  OXYGEN Inhale 2 L into the lungs at bedtime.   Yes [provider]  pantoprazole (PROTONIX) 40 MG tablet TAKE 1 TABLET BY MOUTH DAILY 12/08/22  Yes Cannady, Jolene T, NP  pregabalin (LYRICA) 25 MG capsule Take 1 capsule (25 mg total) by mouth 2 (two) times daily. 09/01/22  Yes Cannady, Jolene T, NP  rosuvastatin (CRESTOR) 40 MG tablet Take 1 tablet (40 mg total) by mouth daily. 05/04/22  Yes Cannady, Jolene T, NP  sitaGLIPtin (JANUVIA) 100 MG tablet TAKE 1 TABLET BY MOUTH DAILY 10/25/22  Yes Cannady, Jolene T, NP  traZODone (DESYREL) 100 MG tablet TAKE 1 TABLET BY MOUTH AT BEDTIME AS NEEDED FOR SLEEP Patient taking differently: Take 100 mg by mouth at bedtime. 10/25/22  Yes Cannady, Jolene T, NP  verapamil (CALAN) 80 MG tablet TAKE 1 TABLET BY MOUTH DAILY 10/05/22  Yes  Cannady, Jolene T, NP  Accu-Chek Softclix Lancets lancets USE TO CHECK BLOOD SUGAR DAILY 05/12/21   Cannady, Jolene T, NP  albuterol (PROVENTIL) (2.5 MG/3ML) 0.083% nebulizer solution Take 3 mLs (2.5 mg total) by nebulization every 6 (six) hours as needed for wheezing or shortness of breath. 11/01/22   Cannady, Corrie Dandy T, NP  albuterol (VENTOLIN HFA) 108 (90 Base) MCG/ACT inhaler INHALE 2 PUFFS BY  MOUTH INTO THE LUNGS EVERY 6 HOURS AS NEEDED FOR WHEEZING OR SHORTNESS OF BREATH 04/15/22   Cannady, Corrie Dandy T, NP  Blood Glucose Monitoring Suppl (ACCU-CHEK GUIDE ME) w/Device KIT Use to check blood sugars 2-3 times daily with goals = <130 fasting in morning and <180 two hours after eating.  Bring blood sugar log to appointments. 05/11/21   Aura Dials T, NP  glucose blood (ACCU-CHEK GUIDE) test strip Use as instructed to check blood sugar daily 11/01/22   Cannady, Jolene T, NP  lidocaine (LIDODERM) 5 % Place 1 patch onto the skin daily. Remove & Discard patch within 12 hours or as directed by MD 05/04/22   Aura Dials T, NP  prochlorperazine (COMPAZINE) 10 MG tablet Take 10 mg by mouth every 6 (six) hours as needed for vomiting or nausea. 05/10/21   [provider]   Past Medical History:  Diagnosis Date   (HFpEF) heart failure with preserved ejection fraction (HCC)    Asthma    Cancer (HCC)    stomach   Chronic back pain    COPD (chronic obstructive pulmonary disease) (HCC)    Degenerative joint disease (DJD) of hip    Diabetes mellitus without complication (HCC)    Dyspnea    GERD (gastroesophageal reflux disease)    takes Omeprazole daily   History of bronchitis yrs ago   History of colon polyps    benign   History of staph infection 10 yrs ago   Hyperlipidemia    takes Atorvastatin daily   Hypertension    Palpitations    Peripheral vascular disease (HCC)    Tobacco use    8 cigarettes daily    Weakness    numbness and tingling in both legs    Wears dentures    full upper    Past Surgical History:  Procedure Laterality Date   APPENDECTOMY     BACK SURGERY  9-10 yrs ago   BREAST BIOPSY Right    Core- neg   COLONOSCOPY     COLONOSCOPY WITH PROPOFOL N/A 09/20/2017   Procedure: COLONOSCOPY WITH PROPOFOL;  Surgeon: Pasty Spillers, MD;  Location: [facility] ENDOSCOPY;  Service: Endoscopy;  Laterality: N/A;   COLONOSCOPY WITH PROPOFOL N/A 04/24/2018   Procedure: COLONOSCOPY WITH PROPOFOL;  Surgeon: Midge Minium, MD;  Location[facility] ENDOSCOPY;  Service: Endoscopy;  Laterality: N/A;   COLONOSCOPY WITH PROPOFOL N/A 06/10/2021   Procedure: COLONOSCOPY WITH BIOPSY;  Surgeon: Midge Minium, MD;  Location: [facility];  Service: Endoscopy;  Laterality: N/A;   ESOPHAGEAL DILATION  06/10/2021   Procedure: ESOPHAGEAL DILATION;  Surgeon: Midge Minium, MD;  Location: [facility];  Service: Endoscopy;;  15-18 mm   ESOPHAGOGASTRODUODENOSCOPY (EGD) WITH PROPOFOL N/A 09/20/2017   Procedure: ESOPHAGOGASTRODUODENOSCOPY (EGD) WITH PROPOFOL;  Surgeon: Pasty Spillers, MD;  Location: [facility] ENDOSCOPY;  Service: Endoscopy;  Laterality: N/A;   ESOPHAGOGASTRODUODENOSCOPY (EGD) WITH PROPOFOL N/A 11/20/2018   Procedure: ESOPHAGOGASTRODUODENOSCOPY (EGD) WITH PROPOFOL;  Surgeon: Midge Minium, MD;  Location: [facility] ENDOSCOPY;  Service: Endoscopy;  Laterality: N/A;   ESOPHAGOGASTRODUODENOSCOPY (EGD) WITH PROPOFOL N/A 06/10/2021   Procedure: ESOPHAGOGASTRODUODENOSCOPY (EGD) WITH BIOPSY;  Surgeon: Midge Minium, MD;  Location: [facility];  Service: Endoscopy;  Laterality: N/A;  Diabetic Latex   EYE SURGERY Bilateral    cataract extraction   PERIPHERAL VASCULAR CATHETERIZATION Left 03/17/2016   Procedure: Lower Extremity Angiography;  Surgeon: Annice Needy, MD;  Location: [facility] INVASIVE CV LAB;  Service: Cardiovascular;  Laterality: Left;   POLYPECTOMY N/A  06/10/2021   Procedure: POLYPECTOMY;  Surgeon: Midge Minium, MD;  Location: [facility];  Service: Endoscopy;   Laterality: N/A;   TOTAL ABDOMINAL HYSTERECTOMY W/ BILATERAL SALPINGOOPHORECTOMY     total   TOTAL SHOULDER ARTHROPLASTY Right 07/24/2019   Procedure: RIGHT TOTAL SHOULDER ARTHROPLASTY;  Surgeon: Lyndle Herrlich, MD;  Location: [facility] ORS;  Service: Orthopedics;  Laterality: Right;   OR for lumbar decompression L3/4 She understands and wishes to proceed for lumbar decompression and arthrodesis.  Risks including but not limited to bleeding, infection, need for further surgery, hardware failure, fusion failure, no pain relief nerve damage, bowel and or bladder dysfunction, and other risks were discussed.

## 2022-12-08 NOTE — Anesthesia Procedure Notes (Signed)
Procedure Name: Intubation Date/Time: 12/08/2022 2:05 PM  Performed by: April Holding, CRNAPre-anesthesia Checklist: Emergency Drugs available, Suction available, Patient being monitored and Timeout performed Patient Re-evaluated:Patient Re-evaluated prior to induction Oxygen Delivery Method: Circle system utilized and Ambu bag Preoxygenation: Pre-oxygenation with 100% oxygen Induction Type: IV induction and Cricoid Pressure applied Ventilation: Mask ventilation without difficulty Laryngoscope Size: Miller and 2 Grade View: Grade I Tube size: 7.0 mm Airway Equipment and Method: Stylet

## 2022-12-08 NOTE — Op Note (Signed)
12/08/2022  6:30 PM  PATIENT:  Stacey Gross  62 y.o. female With retrolisthesis at L3/4 and stenosis PRE-OPERATIVE DIAGNOSIS:  Spondylolisthesis L3/4  POST-OPERATIVE DIAGNOSIS:  Spondylolisthesis L3/4, neurogenic claudication  PROCEDURE:  Procedure(s): Posterior Lumbar Interbody Fusion - Lumbar three-Lumbar four - Posterior Lateral and Interbody fusion Half Dome X cages packed with autograft Disc space packed with autograft morsels L3 laminectomy in excess of needed exposure for the PLIF Non segmental pedicle screw fixation L3-4 Removal pedicle screws L5 SURGEON:  Surgeon(s): Coletta Memos, MD  ASSISTANTS:none  ANESTHESIA:   general  EBL:  Total I/O In: 1600 [I.V.:1500; IV Piggyback:100] Out: 550 [Urine:350; Blood:200]  BLOOD ADMINISTERED:none  CELL SAVER GIVEN:not used  COUNT:per nursing  DRAINS: Urinary Catheter (Foley)   SPECIMEN:  No Specimen  DICTATION: Stacey Gross is a 62 y.o. female whom was taken to the operating room intubated, and placed under a general anesthetic without difficulty. A foley catheter was placed under sterile conditions. She was positioned prone on a Jackson table with all pressure points properly padded.  Her lumbar region was prepped and draped in a sterile manner. I infiltrated 20cc's 1/2%lidocaine/1:2000,000 strength epinephrine into the planned incision. I opened the skin with a 10 blade and took the incision down to the thoracolumbar fascia. I exposed the lamina of L3 in a subperiosteal fashion bilaterally. I confirmed my location with an intraoperative xray.  I placed self retaining retractors and started the decompression.  I decompressed the spinal canal via a near complete laminectomy of L3. I performed inferior facetectomies of L3 on each side. This allowed for a full decompression of the L3 roots in the lateral recess, and the neural foramina along with the spinal canal.  PLIF's were performed at L3/4 in the same fashion. I opened the  disc space with a 15 blade then used a variety of instruments to remove the disc and prepare the space for the arthrodesis. I used curettes, rongeurs, punches, shavers for the disc space, and rasps in the discetomy. I measured the disc space and placed 2 Half Dome X expandable cages packed with autograft morsels into the disc space. I also packed bone into the disc space.     I placed pedicle screws at L3, using fluoroscopic guidance. I drilled a pilot hole, then cannulated the pedicle with a drill at each site. I3 then tapped each pedicle, assessing each site for pedicle violations. No cutouts were appreciated. Screws (xia 6.33mm) were then placed at each site without difficulty. I removed the locking caps from the existing L4 and L5 construct. I removed the rod, and the L5 screws.  I attached rods and locking caps with the appropriate tools. The locking caps were secured with torque limited screwdrivers. Final films were performed and the final construct appeared to be in good position.  I closed the wound in a layered fashion. I approximated the thoracolumbar fascia, subcutaneous, and subcuticular planes with vicryl sutures. I used dermaBOND, and an occlusive bandage for a sterile dressing.     PLAN OF CARE: Admit to inpatient   PATIENT DISPOSITION:  PACU - hemodynamically stable.   Delay start of Pharmacological VTE agent (>24hrs) due to surgical blood loss or risk of bleeding:  yes

## 2022-12-09 DIAGNOSIS — M48062 Spinal stenosis, lumbar region with neurogenic claudication: Secondary | ICD-10-CM | POA: Diagnosis not present

## 2022-12-09 DIAGNOSIS — I110 Hypertensive heart disease with heart failure: Secondary | ICD-10-CM | POA: Diagnosis not present

## 2022-12-09 DIAGNOSIS — M4316 Spondylolisthesis, lumbar region: Secondary | ICD-10-CM | POA: Diagnosis not present

## 2022-12-09 DIAGNOSIS — I5032 Chronic diastolic (congestive) heart failure: Secondary | ICD-10-CM | POA: Diagnosis not present

## 2022-12-09 DIAGNOSIS — J449 Chronic obstructive pulmonary disease, unspecified: Secondary | ICD-10-CM | POA: Diagnosis not present

## 2022-12-09 DIAGNOSIS — Z7984 Long term (current) use of oral hypoglycemic drugs: Secondary | ICD-10-CM | POA: Diagnosis not present

## 2022-12-09 DIAGNOSIS — E1151 Type 2 diabetes mellitus with diabetic peripheral angiopathy without gangrene: Secondary | ICD-10-CM | POA: Diagnosis not present

## 2022-12-09 MED ORDER — OXYCODONE HCL 5 MG PO TABS: 5.0000 mg | ORAL_TABLET | Freq: Four times a day (QID) | ORAL | 0 refills | Status: DC | PRN

## 2022-12-09 NOTE — Progress Notes (Signed)
Patient alert and oriented, mae's well, voiding adequate amount of urine, swallowing without difficulty, no c/o pain at time of discharge. Patient discharged home with family. Script and discharged instructions given to patient. Patient and family stated understanding of instructions given. Patient has an appointment with Dr. Cabbell   

## 2022-12-09 NOTE — Discharge Summary (Signed)
Physician Discharge Summary  Patient ID: Stacey Gross MRN: [id number] DOB/AGE: 08-31-1960 62 y.o.  Admit date: 12/08/2022 Discharge date: 12/09/2022  Admission Diagnoses:adjacent segment disease, neurogenic claudication lumbar stenosis Spondylolisthesis L3/4  Discharge Diagnoses:  Principal Problem:   Spondylolisthesis of lumbar region   Discharged Condition: good  Hospital Course: Stacey Gross was admitted and taken to the operating room for a decompression and fusion of L3/4. Post op she is ambulating, voiding, and tolerating a regular diet. Her wound is clean, dry, and without signs of infection.   Treatments: surgery: Posterior Lumbar Interbody Fusion - Lumbar three-Lumbar four - Posterior Lateral and Interbody fusion Half Dome X cages packed with autograft Disc space packed with autograft morsels L3 laminectomy in excess of needed exposure for the PLIF Non segmental pedicle screw fixation L3-4 Removal pedicle screws L5  Discharge Exam: Blood pressure 123/67, pulse 83, temperature 98.5 F (36.9 C), temperature source Oral, resp. rate 20, height 5\' 7"  (1.702 m), weight 97.1 kg, SpO2 91%. General appearance: alert, cooperative, appears stated age, and no distress  Disposition: Discharge disposition: 01-Home or Self Care      Spondylolisthesis  Allergies as of 12/09/2022       Reactions   Doxycycline Hyclate Anaphylaxis, Swelling, Rash   Acetaminophen Swelling   Tongue swelling and breaks out in welts   Aspirin Swelling   Tongue swelling and breaks out in welts   Latex Rash   Gloves, underwear elastic, tape   Vancomycin Rash   All mycins        Medication List     TAKE these medications    Accu-Chek Guide Me w/Device Kit Use to check blood sugars 2-3 times daily with goals = <130 fasting in morning and <180 two hours after eating.  Bring blood sugar log to appointments.   Accu-Chek Guide test strip Generic drug: glucose blood Use as instructed to check  blood sugar daily   Accu-Chek Softclix Lancets lancets USE TO CHECK BLOOD SUGAR DAILY   albuterol 108 (90 Base) MCG/ACT inhaler Commonly known as: VENTOLIN HFA INHALE 2 PUFFS BY MOUTH INTO THE LUNGS EVERY 6 HOURS AS NEEDED FOR WHEEZING OR SHORTNESS OF BREATH   albuterol (2.5 MG/3ML) 0.083% nebulizer solution Commonly known as: PROVENTIL Take 3 mLs (2.5 mg total) by nebulization every 6 (six) hours as needed for wheezing or shortness of breath.   Breztri Aerosphere 160-9-4.8 MCG/ACT Aero Generic drug: Budeson-Glycopyrrol-Formoterol Inhale 2 puffs into the lungs in the morning and at bedtime.   Cholecalciferol 25 MCG (1000 UT) capsule Take 2,000 Units by mouth daily.   cyanocobalamin 1000 MCG tablet Commonly known as: VITAMIN B12 Take 1 tablet (1,000 mcg total) by mouth daily.   cyclobenzaprine 10 MG tablet Commonly known as: FLEXERIL Take 10 mg by mouth 3 (three) times daily as needed for muscle spasms.   DULoxetine 60 MG capsule Commonly known as: Cymbalta Take 1 capsule (60 mg total) by mouth daily.   furosemide 20 MG tablet Commonly known as: LASIX TAKE 1 TABLET EVERY DAY AS NEEDED   hydrOXYzine 25 MG capsule Commonly known as: VISTARIL Take 1 capsule (25 mg total) by mouth every 8 (eight) hours as needed.   Januvia 100 MG tablet Generic drug: sitaGLIPtin TAKE 1 TABLET BY MOUTH DAILY   lidocaine 5 % Commonly known as: Lidoderm Place 1 patch onto the skin daily. Remove & Discard patch within 12 hours or as directed by MD   metFORMIN 500 MG tablet Commonly known as: GLUCOPHAGE Take 1  tablet (500 mg total) by mouth 2 (two) times daily. What changed: when to take this   olmesartan 5 MG tablet Commonly known as: BENICAR TAKE 1 TABLET BY MOUTH DAILY   oxyCODONE 5 MG immediate release tablet Commonly known as: Oxy IR/ROXICODONE Take 1 tablet (5 mg total) by mouth every 6 (six) hours as needed for moderate pain ((score 4 to 6)).   OXYGEN Inhale 2 L into the  lungs at bedtime.   pantoprazole 40 MG tablet Commonly known as: PROTONIX TAKE 1 TABLET BY MOUTH DAILY   pregabalin 25 MG capsule Commonly known as: Lyrica Take 1 capsule (25 mg total) by mouth 2 (two) times daily.   prochlorperazine 10 MG tablet Commonly known as: COMPAZINE Take 10 mg by mouth every 6 (six) hours as needed for vomiting or nausea.   rosuvastatin 40 MG tablet Commonly known as: CRESTOR Take 1 tablet (40 mg total) by mouth daily.   traZODone 100 MG tablet Commonly known as: DESYREL TAKE 1 TABLET BY MOUTH AT BEDTIME AS NEEDED FOR SLEEP What changed:  when to take this additional instructions   verapamil 80 MG tablet Commonly known as: CALAN TAKE 1 TABLET BY MOUTH DAILY        Follow-up Information     Coletta Memos, MD Follow up.   Specialty: Neurosurgery Why: As needed, If symptoms worsen Contact information: [address] [place] [address] [phone]                 Signed: Coletta Memos 12/09/2022, 12:07 PM

## 2022-12-09 NOTE — Anesthesia Postprocedure Evaluation (Signed)
Anesthesia Post Note  Patient: Stacey Gross  Procedure(s) Performed: Posterior Lumbar Interbody Fusion - Lumbar three-Lumbar four - Posterior Lateral and Interbody fusion (Spine Lumbar)     Patient location during evaluation: PACU Anesthesia Type: General Level of consciousness: awake and alert Pain management: pain level controlled Vital Signs Assessment: post-procedure vital signs reviewed and stable Respiratory status: spontaneous breathing, nonlabored ventilation, respiratory function stable and patient connected to nasal cannula oxygen Cardiovascular status: blood pressure returned to baseline and stable Postop Assessment: no apparent nausea or vomiting Anesthetic complications: no   No notable events documented.  Last Vitals:  Vitals:   12/08/22 2303 12/09/22 0312  BP: 137/81 (!) 101/53  Pulse: 72 62  Resp: 20 17  Temp: 36.9 C 36.8 C  SpO2: 93% 95%    Last Pain:  Vitals:   12/09/22 0632  TempSrc:   PainSc: 5                  Cola Highfill S

## 2022-12-09 NOTE — Evaluation (Signed)
Physical Therapy Evaluation  Patient Details Name: Stacey Gross MRN: [id number] DOB: [birth year] Today's Date: 12/09/2022  History of Present Illness  Pt is a 62 y/o female who presents s/p L3-L4 PLIF on 12/08/2022. PMH significant for CA, COPD, DJD hip, DM, HTN, palpitations, PVD, prior back surgery ~10 years ago, R TSA 2021.  Clinical Impression  Pt admitted with above diagnosis. At the time of PT eval, pt was able to demonstrate transfers and ambulation with gross min guard assist progressing to supervision for safety by end of session. Pt was educated on precautions, brace application/wearing schedule, appropriate activity progression, and car transfer. Pt currently with functional limitations due to the deficits listed below (see PT Problem List). Pt will benefit from skilled PT to increase their independence and safety with mobility to allow discharge to the venue listed below.          If plan is discharge home, recommend the following: A little help with walking and/or transfers;A little help with bathing/dressing/bathroom;Assistance with cooking/housework;Assist for transportation   Can travel by private vehicle        Equipment Recommendations Rolling walker (2 wheels);BSC/3in1  Recommendations for Other Services       Functional Status Assessment Patient has had a recent decline in their functional status and demonstrates the ability to make significant improvements in function in a reasonable and predictable amount of time.     Precautions / Restrictions Precautions Precautions: Fall;Back Precaution Booklet Issued: Yes (comment) Precaution Comments: Reviewed handout and pt was cued for precautions during functional mobility. Required Braces or Orthoses: Spinal Brace Spinal Brace: Lumbar corset;Applied in sitting position Restrictions Weight Bearing Restrictions: No      Mobility  Bed Mobility Overal bed mobility: Needs Assistance Bed Mobility: Rolling, Sidelying to  Sit, Sit to Sidelying Rolling: Modified independent (Device/Increase time) Sidelying to sit: Supervision     Sit to sidelying: Supervision General bed mobility comments: VC's for sequencing and general safety. No assist required. HOB flat and rails lowered to simulate home environment.    Transfers Overall transfer level: Needs assistance Equipment used: Rolling walker (2 wheels) Transfers: Sit to/from Stand Sit to Stand: Contact guard assist, Supervision           General transfer comment: Supervision by end of session. VC's for hand placement on seated surface for safety. Encouraged wide BOS and keeping shoulders and chest up to avoid bending.    Ambulation/Gait Ambulation/Gait assistance: Supervision Gait Distance (Feet): 300 Feet Assistive device: Rolling walker (2 wheels) Gait Pattern/deviations: Step-through pattern, Decreased stride length, Trunk flexed Gait velocity: Decreased Gait velocity interpretation: 1.31 - 2.62 ft/sec, indicative of limited community ambulator   General Gait Details: VC's for improved posture, closer walker proximity and forward gaze. No assist required and no overt LOB noted. At end of gait training pt reports she has not walked this far in over a year.  Stairs            Wheelchair Mobility     Tilt Bed    Modified Rankin (Stroke Patients Only)       Balance Overall balance assessment: Mild deficits observed, not formally tested                                           Pertinent Vitals/Pain Pain Assessment Pain Assessment: Faces Faces Pain Scale: Hurts a little bit Pain Descriptors / Indicators: Operative  site guarding, Sore Pain Intervention(s): Limited activity within patient's tolerance, Monitored during session, Repositioned    Home Living Family/patient expects to be discharged to:: Private residence Living Arrangements: Alone Available Help at Discharge: Family;Available 24 hours/day Type of  Home: House Home Access: Stairs to enter;Ramped entrance   Entrance Stairs-Number of Steps: 1   Home Layout: One level Home Equipment: Cane - single point;Shower seat - built in;Grab bars - tub/shower;Adaptive equipment      Prior Function Prior Level of Function : History of Falls (last six months)             Mobility Comments: Pt reports >10 falls, coming out of grocery store, in and out of the house. Pt able to walk short distances before needing rest break due to pain. ADLs Comments: Sister helping intermittently     Extremity/Trunk Assessment   Upper Extremity Assessment Upper Extremity Assessment: RUE deficits/detail RUE Deficits / Details: Decreased strength and AROM s/p TSA. Pt reports surgery was 1 year ago, chart says 2021.    Lower Extremity Assessment Lower Extremity Assessment: Generalized weakness (Mild. consistent with pre-op diagnosis)    Cervical / Trunk Assessment Cervical / Trunk Assessment: Back Surgery  Communication   Communication Communication: No apparent difficulties Cueing Techniques: Verbal cues;Gestural cues  Cognition Arousal: Alert Behavior During Therapy: WFL for tasks assessed/performed Overall Cognitive Status: Within Functional Limits for tasks assessed                                          General Comments      Exercises     Assessment/Plan    PT Assessment Patient needs continued PT services  PT Problem List Decreased strength;Decreased activity tolerance;Decreased balance;Decreased mobility;Decreased safety awareness;Decreased knowledge of use of DME;Decreased knowledge of precautions;Pain       PT Treatment Interventions DME instruction;Gait training;Stair training;Functional mobility training;Therapeutic activities;Therapeutic exercise;Balance training;Patient/family education    PT Goals (Current goals can be found in the Care Plan section)  Acute Rehab PT Goals Patient Stated Goal: Home  today PT Goal Formulation: With patient Time For Goal Achievement: 12/16/22 Potential to Achieve Goals: Good    Frequency Min 1X/week     Co-evaluation               AM-PAC PT "6 Clicks" Mobility  Outcome Measure Help needed turning from your back to your side while in a flat bed without using bedrails?: None Help needed moving from lying on your back to sitting on the side of a flat bed without using bedrails?: A Little Help needed moving to and from a bed to a chair (including a wheelchair)?: A Little Help needed standing up from a chair using your arms (e.g., wheelchair or bedside chair)?: A Little Help needed to walk in hospital room?: A Little Help needed climbing 3-5 steps with a railing? : A Little 6 Click Score: 19    End of Session Equipment Utilized During Treatment: Gait belt;Back brace Activity Tolerance: Patient tolerated treatment well Patient left: in bed;with call bell/phone within reach (OT present) Nurse Communication: Mobility status PT Visit Diagnosis: Unsteadiness on feet (R26.81);Pain Pain - part of body:  (Back)    Time: 5956-3875 PT Time Calculation (min) (ACUTE ONLY): 28 min   Charges:   PT Evaluation $PT Eval Low Complexity: 1 Low PT Treatments $Gait Training: 8-22 mins PT General Charges $$ ACUTE PT VISIT: 1  Visit         Conni Slipper, PT, DPT Acute Rehabilitation Services Secure Chat Preferred Office: [phone]   Marylynn Pearson 12/09/2022, 9:20 AM

## 2022-12-09 NOTE — Evaluation (Signed)
Occupational Therapy Evaluation Patient Details Name: Stacey Gross MRN: [id number] DOB: 1960-09-10 Today's Date: 12/09/2022   History of Present Illness 62 y/o female who presents s/p L3-L4 PLIF on 12/08/2022. PMH significant for CA, COPD, DJD hip, DM, HTN, palpitations, PVD, prior back surgery ~10 years ago, R TSA 2021.   Clinical Impression   Patient evaluated by Occupational Therapy with no further acute OT needs identified. All education has been completed and the patient has no further questions. See below for any follow-up Occupational Therapy or equipment needs. OT to sign off. Thank you for referral.         If plan is discharge home, recommend the following:      Functional Status Assessment     Equipment Recommendations  BSC/3in1;Other (comment) (RW)    Recommendations for Other Services       Precautions / Restrictions Precautions Precautions: Fall;Back Precaution Booklet Issued: Yes (comment) Precaution Comments: reviewed back precautions for adls Required Braces or Orthoses: Spinal Brace Spinal Brace: Lumbar corset;Applied in sitting position Restrictions Weight Bearing Restrictions: No      Mobility Bed Mobility               General bed mobility comments: up on arrival sitting eob s/p PT session    Transfers Overall transfer level: Needs assistance Equipment used: Rolling walker (2 wheels) Transfers: Sit to/from Stand Sit to Stand: Contact guard assist, Supervision           General transfer comment: cues to keep RW as pt wanted to leave RW on eob to transfer around the bed surface to chair      Balance Overall balance assessment: Mild deficits observed, not formally tested                                         ADL either performed or assessed with clinical judgement   ADL Overall ADL's : Needs assistance/impaired Eating/Feeding: Independent               Upper Body Dressing : Supervision/safety   Lower  Body Dressing: Supervision/safety Lower Body Dressing Details (indicate cue type and reason): requires education on AE used for LB and demonstrations. pt is unable to compelte figure 4 at this time Toilet Transfer: Supervision/safety;Rolling walker (2 wheels);Comfort height toilet;Ambulation           Functional mobility during ADLs: Supervision/safety;Rolling walker (2 wheels)  Back handout provided and reviewed adls in detail. Pt educated on: clothing between brace, never sleep in brace, set an alarm at night for medication, avoid sitting for long periods of time, correct bed positioning for sleeping, correct sequence for bed mobility, avoiding lifting more than 5 pounds and never wash directly over incision. All education is complete and patient indicates understanding.      Vision Baseline Vision/History: 0 No visual deficits Ability to See in Adequate Light: 0 Adequate Patient Visual Report: No change from baseline Vision Assessment?: No apparent visual deficits     Perception         Praxis         Pertinent Vitals/Pain Pain Assessment Pain Assessment: Faces Faces Pain Scale: Hurts a little bit Pain Descriptors / Indicators: Operative site guarding, Sore Pain Intervention(s): Limited activity within patient's tolerance, Monitored during session, Premedicated before session, Repositioned     Extremity/Trunk Assessment Upper Extremity Assessment Upper Extremity Assessment: Right hand dominant;RUE deficits/detail RUE  Deficits / Details: Decreased strength and AROM s/p TSA. Pt reports surgery was 1 year ago, chart says 2021.   Lower Extremity Assessment Lower Extremity Assessment: Overall WFL for tasks assessed   Cervical / Trunk Assessment Cervical / Trunk Assessment: Back Surgery   Communication Communication Communication: No apparent difficulties Cueing Techniques: Verbal cues;Gestural cues   Cognition Arousal: Alert Behavior During Therapy: WFL for tasks  assessed/performed Overall Cognitive Status: Within Functional Limits for tasks assessed                                       General Comments  incision site covered with dressing and dry at this time    Exercises     Shoulder Instructions      Home Living Family/patient expects to be discharged to:: Private residence Living Arrangements: Alone Available Help at Discharge: Family;Available 24 hours/day Type of Home: House Home Access: Stairs to enter;Ramped entrance Entrance Stairs-Number of Steps: 1   Home Layout: One level     Bathroom Shower/Tub: Producer, television/film/video: Standard     Home Equipment: Cane - single point;Shower seat - built in;Grab bars - tub/shower;Adaptive equipment Adaptive Equipment: Reacher Additional Comments: sister and nephew live next door to (A)      Prior Functioning/Environment Prior Level of Function : History of Falls (last six months)             Mobility Comments: Pt reports >10 falls, coming out of grocery store, in and out of the house. Pt able to walk short distances before needing rest break due to pain. ADLs Comments: Sister helping intermittently        OT Problem List:        OT Treatment/Interventions:      OT Goals(Current goals can be found in the care plan section)    OT Frequency:      Co-evaluation              AM-PAC OT "6 Clicks" Daily Activity     Outcome Measure Help from another person eating meals?: None Help from another person taking care of personal grooming?: None Help from another person toileting, which includes using toliet, bedpan, or urinal?: None Help from another person bathing (including washing, rinsing, drying)?: None Help from another person to put on and taking off regular upper body clothing?: None Help from another person to put on and taking off regular lower body clothing?: None 6 Click Score: 24   End of Session Equipment Utilized During  Treatment: Rolling walker (2 wheels);Back brace Nurse Communication: Mobility status;Precautions  Activity Tolerance: Patient tolerated treatment well Patient left: in chair;with call bell/phone within reach                   Time: 0847-0906 OT Time Calculation (min): 19 min Charges:  OT General Charges $OT Visit: 1 Visit OT Evaluation $OT Eval Moderate Complexity: 1 Mod   Brynn, OTR/L  Acute Rehabilitation Services Office: [phone] .   Mateo Flow 12/09/2022, 9:50 AM

## 2022-12-09 NOTE — Discharge Instructions (Signed)
Wound Care Remove dressing in 3 days Leave incision open to air. You may shower. Do not scrub directly on incision.  Do not put any creams, lotions, or ointments on incision. Activity Walk each and every day, increasing distance each day. No lifting greater than 8 lbs.  Avoid bending, arching, and twisting. No driving for 2 weeks; may ride as a passenger locally. If provided with back brace, wear when out of bed.  It is not necessary to wear in bed. Diet Resume your normal diet.  Return to Work Will be discussed at you follow up appointment. Call Your Doctor If Any of These Occur Redness, drainage, or swelling at the wound.  Temperature greater than 101 degrees. Severe pain not relieved by pain medication. Incision starts to come apart. Follow Up Appt Call  [phone]) for problems.  If you have any hardware placed in your spine, you will need an x-ray before your appointment.

## 2022-12-22 DIAGNOSIS — Z961 Presence of intraocular lens: Secondary | ICD-10-CM | POA: Diagnosis not present

## 2022-12-22 DIAGNOSIS — E119 Type 2 diabetes mellitus without complications: Secondary | ICD-10-CM | POA: Diagnosis not present

## 2022-12-22 DIAGNOSIS — H353132 Nonexudative age-related macular degeneration, bilateral, intermediate dry stage: Secondary | ICD-10-CM | POA: Diagnosis not present

## 2022-12-22 LAB — HM DIABETES EYE EXAM

## 2023-01-02 DIAGNOSIS — J449 Chronic obstructive pulmonary disease, unspecified: Secondary | ICD-10-CM | POA: Diagnosis not present

## 2023-01-02 DIAGNOSIS — J9611 Chronic respiratory failure with hypoxia: Secondary | ICD-10-CM | POA: Diagnosis not present

## 2023-01-05 ENCOUNTER — Other Ambulatory Visit: Payer: Self-pay | Admitting: Nurse Practitioner

## 2023-01-05 NOTE — Telephone Encounter (Signed)
Requested Prescriptions  Pending Prescriptions Disp Refills   verapamil (CALAN) 80 MG tablet [Pharmacy Med Name: VERAPAMIL HCL 80 MG TAB] 90 tablet 1    Sig: TAKE 1 TABLET BY MOUTH DAILY     Cardiovascular: Calcium Channel Blockers 3 Passed - 01/05/2023  9:52 AM      Passed - ALT in normal range and within 360 days    ALT  Date Value Ref Range Status  08/02/2022 18 0 - 32 IU/L Final   ALT (SGPT) Piccolo, Waived  Date Value Ref Range Status  02/22/2017 19 10 - 47 U/L Final         Passed - AST in normal range and within 360 days    AST  Date Value Ref Range Status  08/02/2022 22 0 - 40 IU/L Final   AST (SGOT) Piccolo, Waived  Date Value Ref Range Status  02/22/2017 32 11 - 38 U/L Final         Passed - Cr in normal range and within 360 days    Creat  Date Value Ref Range Status  07/02/2013 0.79 0.50 - 1.10 mg/dL Final   Creatinine, Ser  Date Value Ref Range Status  12/01/2022 0.86 0.44 - 1.00 mg/dL Final         Passed - Last BP in normal range    BP Readings from Last 1 Encounters:  12/09/22 123/67         Passed - Last Heart Rate in normal range    Pulse Readings from Last 1 Encounters:  12/09/22 83         Passed - Valid encounter within last 6 months    Recent Outpatient Visits           2 months ago Type 2 diabetes, controlled, with peripheral circulatory disorder (HCC)   [place] [facility] [place], [place] T, NP   3 months ago Chronic bilateral low back pain without sciatica   [place] [facility] [place], [place] T, NP   4 months ago Hypertension associated with diabetes (HCC)   [place] [facility] [place], [place] T, NP   5 months ago Type 2 diabetes, controlled, with peripheral circulatory disorder (HCC)   [place] [facility] [place], Jol[place] T, NP   8 months ago Type 2 diabetes, controlled, with peripheral circulatory disorder (HCC)   [place] [facility] [place], Dorie Rank, NP       Future Appointments             In 3 weeks Cannady, Dorie Rank, NP [place] [facility]

## 2023-01-09 DIAGNOSIS — M4316 Spondylolisthesis, lumbar region: Secondary | ICD-10-CM | POA: Diagnosis not present

## 2023-01-18 ENCOUNTER — Other Ambulatory Visit: Payer: Self-pay | Admitting: Nurse Practitioner

## 2023-01-18 NOTE — Telephone Encounter (Signed)
Requested Prescriptions  Pending Prescriptions Disp Refills   traZODone (DESYREL) 100 MG tablet [Pharmacy Med Name: TRAZODONE HCL 100 MG TAB] 90 tablet 0    Sig: TAKE 1 TABLET BY MOUTH AT BEDTIME AS NEEDED FOR SLEEP     Psychiatry: Antidepressants - Serotonin Modulator Passed - 01/18/2023  9:24 AM      Passed - Valid encounter within last 6 months    Recent Outpatient Visits           2 months ago Type 2 diabetes, controlled, with peripheral circulatory disorder (HCC)   [place] [facility] [place], [place] T, NP   3 months ago Chronic bilateral low back pain without sciatica   [place] [facility] [place], [place] T, NP   4 months ago Hypertension associated with diabetes (HCC)   [place] [facility] [place], [place] T, NP   5 months ago Type 2 diabetes, controlled, with peripheral circulatory disorder (HCC)   [place] [facility] [place], Jolene T, NP   8 months ago Type 2 diabetes, controlled, with peripheral circulatory disorder (HCC)   [place] [facility] [place], Dorie Rank, NP       Future Appointments             In 2 weeks Cannady, Dorie Rank, NP [place] [facility], [facility]

## 2023-01-23 ENCOUNTER — Ambulatory Visit: Payer: 59

## 2023-01-29 NOTE — Patient Instructions (Signed)
Be Involved in Caring For Your Health:  Taking Medications When medications are taken as directed, they can greatly improve your health. But if they are not taken as prescribed, they may not work. In some cases, not taking them correctly can be harmful. To help ensure your treatment remains effective and safe, understand your medications and how to take them. Bring your medications to each visit for review by your provider.  Your lab results, notes, and after visit summary will be available on My Chart. We strongly encourage you to use this feature. If lab results are abnormal the clinic will contact you with the appropriate steps. If the clinic does not contact you assume the results are satisfactory. You can always view your results on My Chart. If you have questions regarding your health or results, please contact the clinic during office hours. You can also ask questions on My Chart.  We at [facility] are grateful that you chose [place] to provide your care. We strive to provide evidence-based and compassionate care and are always looking for feedback. If you get a survey from the clinic please complete this so we can hear your opinions.  Diabetes Mellitus and Foot Care Diabetes, also called diabetes mellitus, may cause problems with your feet and legs because of poor blood flow (circulation). Poor circulation may make your skin: Become thinner and drier. Break more easily. Heal more slowly. Peel and crack. You may also have nerve damage (neuropathy). This can cause decreased feeling in your legs and feet. This means that you may not notice minor injuries to your feet that could lead to more serious problems. Finding and treating problems early is the best way to prevent future foot problems. How to care for your feet Foot hygiene  Wash your feet daily with warm water and mild soap. Do not use hot water. Then, pat your feet and the areas between your toes until they are fully dry. Do  not soak your feet. This can dry your skin. Trim your toenails straight across. Do not dig under them or around the cuticle. File the edges of your nails with an emery board or nail file. Apply a moisturizing lotion or petroleum jelly to the skin on your feet and to dry, brittle toenails. Use lotion that does not contain alcohol and is unscented. Do not apply lotion between your toes. Shoes and socks Wear clean socks or stockings every day. Make sure they are not too tight. Do not wear knee-high stockings. These may decrease blood flow to your legs. Wear shoes that fit well and have enough cushioning. Always look in your shoes before you put them on to be sure there are no objects inside. To break in new shoes, wear them for just a few hours a day. This prevents injuries on your feet. Wounds, scrapes, corns, and calluses  Check your feet daily for blisters, cuts, bruises, sores, and redness. If you cannot see the bottom of your feet, use a mirror or ask someone for help. Do not cut off corns or calluses or try to remove them with medicine. If you find a minor scrape, cut, or break in the skin on your feet, keep it and the skin around it clean and dry. You may clean these areas with mild soap and water. Do not clean the area with peroxide, alcohol, or iodine. If you have a wound, scrape, corn, or callus on your foot, look at it several times a day to make sure it  is healing and not infected. Check for: Redness, swelling, or pain. Fluid or blood. Warmth. Pus or a bad smell. General tips Do not cross your legs. This may decrease blood flow to your feet. Do not use heating pads or hot water bottles on your feet. They may burn your skin. If you have lost feeling in your feet or legs, you may not know this is happening until it is too late. Protect your feet from hot and cold by wearing shoes, such as at the beach or on hot pavement. Schedule a complete foot exam at least once a year or more often if  you have foot problems. Report any cuts, sores, or bruises to your health care provider right away. Where to find more information American Diabetes Association: diabetes.org Association of Diabetes Care & Education Specialists: diabeteseducator.org Contact a health care provider if: You have a condition that increases your risk of infection, and you have any cuts, sores, or bruises on your feet. You have an injury that is not healing. You have redness on your legs or feet. You feel burning or tingling in your legs or feet. You have pain or cramps in your legs and feet. Your legs or feet are numb. Your feet always feel cold. You have pain around any toenails. Get help right away if: You have a wound, scrape, corn, or callus on your foot and: You have signs of infection. You have a fever. You have a red line going up your leg. This information is not intended to replace advice given to you by your health care provider. Make sure you discuss any questions you have with your health care provider. Document Revised: 09/22/2021 Document Reviewed: 09/22/2021 Elsevier Patient Education  2024 [employer].

## 2023-02-01 ENCOUNTER — Ambulatory Visit (INDEPENDENT_AMBULATORY_CARE_PROVIDER_SITE_OTHER): Payer: 59 | Admitting: Nurse Practitioner

## 2023-02-01 ENCOUNTER — Encounter: Payer: Self-pay | Admitting: Nurse Practitioner

## 2023-02-01 VITALS — BP 122/88 | HR 99 | Temp 98.0°F | Ht 67.5 in | Wt 202.6 lb

## 2023-02-01 DIAGNOSIS — Z1231 Encounter for screening mammogram for malignant neoplasm of breast: Secondary | ICD-10-CM

## 2023-02-01 DIAGNOSIS — R634 Abnormal weight loss: Secondary | ICD-10-CM | POA: Insufficient documentation

## 2023-02-01 DIAGNOSIS — J9611 Chronic respiratory failure with hypoxia: Secondary | ICD-10-CM | POA: Diagnosis not present

## 2023-02-01 DIAGNOSIS — E66811 Obesity, class 1: Secondary | ICD-10-CM

## 2023-02-01 DIAGNOSIS — I5032 Chronic diastolic (congestive) heart failure: Secondary | ICD-10-CM | POA: Diagnosis not present

## 2023-02-01 DIAGNOSIS — Z981 Arthrodesis status: Secondary | ICD-10-CM | POA: Diagnosis not present

## 2023-02-01 DIAGNOSIS — E1159 Type 2 diabetes mellitus with other circulatory complications: Secondary | ICD-10-CM | POA: Diagnosis not present

## 2023-02-01 DIAGNOSIS — I152 Hypertension secondary to endocrine disorders: Secondary | ICD-10-CM | POA: Diagnosis not present

## 2023-02-01 DIAGNOSIS — D692 Other nonthrombocytopenic purpura: Secondary | ICD-10-CM | POA: Diagnosis not present

## 2023-02-01 DIAGNOSIS — E785 Hyperlipidemia, unspecified: Secondary | ICD-10-CM | POA: Diagnosis not present

## 2023-02-01 DIAGNOSIS — E1169 Type 2 diabetes mellitus with other specified complication: Secondary | ICD-10-CM

## 2023-02-01 DIAGNOSIS — E6609 Other obesity due to excess calories: Secondary | ICD-10-CM

## 2023-02-01 DIAGNOSIS — J9612 Chronic respiratory failure with hypercapnia: Secondary | ICD-10-CM

## 2023-02-01 DIAGNOSIS — F17219 Nicotine dependence, cigarettes, with unspecified nicotine-induced disorders: Secondary | ICD-10-CM

## 2023-02-01 DIAGNOSIS — J432 Centrilobular emphysema: Secondary | ICD-10-CM | POA: Diagnosis not present

## 2023-02-01 DIAGNOSIS — Z23 Encounter for immunization: Secondary | ICD-10-CM

## 2023-02-01 DIAGNOSIS — E1151 Type 2 diabetes mellitus with diabetic peripheral angiopathy without gangrene: Secondary | ICD-10-CM

## 2023-02-01 DIAGNOSIS — J449 Chronic obstructive pulmonary disease, unspecified: Secondary | ICD-10-CM | POA: Diagnosis not present

## 2023-02-01 DIAGNOSIS — F411 Generalized anxiety disorder: Secondary | ICD-10-CM

## 2023-02-01 LAB — BAYER DCA HB A1C WAIVED: HB A1C (BAYER DCA - WAIVED): 5.9 % — ABNORMAL HIGH (ref 4.8–5.6)

## 2023-02-01 MED ORDER — PREDNISONE 20 MG PO TABS: 40.0000 mg | ORAL_TABLET | Freq: Every day | ORAL | 0 refills | Status: AC

## 2023-02-01 MED ORDER — AMOXICILLIN-POT CLAVULANATE 875-125 MG PO TABS: 1.0000 | ORAL_TABLET | Freq: Two times a day (BID) | ORAL | 0 refills | Status: AC

## 2023-02-01 NOTE — Assessment & Plan Note (Signed)
Chronic, ongoing. Denies SI/HI.  Will continue Duloxetine 60 MG since is offering benefit to mood and chronic back pain -- she does not wish to add on any other medicine, although scores are elevated.  Educated her on this medication and change + side effects and BLACK BOX warning.

## 2023-02-01 NOTE — Progress Notes (Signed)
BP 122/88 (BP Location: Left Arm, Patient Position: Sitting, Cuff Size: Large)   Pulse 99   Temp 98 F (36.7 C) (Oral)   Ht 5' 7.5" (1.715 m)   Wt 202 lb 9.6 oz (91.9 kg)   SpO2 92%   BMI 31.26 kg/m    Subjective:    Patient ID: Stacey Gross, female    DOB: Aug 12, 1960, 62 y.o.   MRN: [id number]  HPI: Stacey Gross is a 62 y.o. female  Chief Complaint  Patient presents with   Hypertension   Hyperlipidemia   Depression   Diabetes   COPD   Otitis Media    Possible ear infection, is having some pain and dizziness.   Rhys Martini, at bedside to assist with HPI.  DIABETES A1 July was 6%, maintaining at goal.  Continues on Januvia 100 MG daily. Metformin caused increase in bowel movements and could not leave house. GLP1 medications caused major stomach issues and Farxiga caused yeast infections.   Has lost 13 pounds since August, does not feel hungry.  Putting things up to mouth makes her gag.  Food stinks.  Having diarrhea at times.  Occasional abdominal pain, but she states this is not consistent.  Denies any hemoptysis, night sweats, fever.  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  Currently does not want to pursue any testing for this, offered today.  She is aware if cancer present and she does not pursue assessment for this it could advance and be incurable.  She states that "is okay" and she "has things set-up".  [name] is her HCPOA.  Reports she is ready for her time and would not pursue treatment. Hypoglycemic episodes:no Polydipsia/polyuria: no Visual disturbance: no Chest pain: no Paresthesias: no Glucose Monitoring: yes             Accucheck frequency: not checking             Fasting glucose:              Post prandial:             Evening:             Before meals: Taking Insulin?: no             Long acting insulin:             Short acting insulin: Blood Pressure Monitoring: not  checking Retinal Examination: Up To Date -- 3 months ago with Clydene Pugh Foot Exam: Up to Date Pneumovax: Up to Date Influenza: Not Up to Date -- refuses  Aspirin: no    HYPERTENSION / HYPERLIPIDEMIA/CHF Taking Olmesartan 5 MG, Verapamil 80 MG daily, Lasix 20 MG PRN, and Crestor 40 MG daily.  Last visit with cardiology was 11/24/21 with no changes.  EF on echo 10/20/21 noted EF 50-55% and some thickening of right ventricle. Has vascular visits for her blue toe syndrome/PVD. Last saw 06/17/22, to follow annually. No changes made which she report frustrated her.  Her toes occasionally turn blue and blister, then clear up.   Satisfied with current treatment? yes Duration of hypertension: chronic BP monitoring frequency: not checking BP range:  BP medication side effects: no Duration of hyperlipidemia: chronic Cholesterol medication side effects: no Cholesterol supplements: none Medication compliance: good compliance Aspirin: no Recent stressors: no Recurrent headaches: no Visual changes: no Palpitations: no  Dyspnea: occasional, rescue inhaler helps Chest pain: no Lower extremity  edema: no Dizzy/lightheaded: with current ear issues, states pressure and pain in both ears.  COPD Pulmonary visit 01/06/22-- they recommended to continue Breztri and Albuterol as needed.  Uses oxygen at night -- uses this off and on, gets tired of getting wrapped up in cord.  Continues smoking nicotine, 1 PPD.  Not interested in quitting -- has been smoking since age 1.  Lung CT screening 01/21/22 == emphysema and aortic atherosclerosis noted, to continue annually.  COPD status: stable Satisfied with current treatment?: yes Oxygen use: no Dyspnea frequency: occasional, uses rescue inhaler which helps Cough frequency: at baseline Rescue inhaler frequency: every morning Limitation of activity: no Productive cough: no Last Spirometry: with pulmonary Pneumovax: Up to Date Influenza:  Refuses  ANXIETY/STRESS Taking Duloxetine for both pain and mood.    Has chronic back pain -- can not take Tylenol. Surgery in 2016 to lower back -- Dr. Franky Macho and he performed recent posterior lumbar interbody fusion on 12/08/22.  Pain slowly improving, no further pain in feet.  Severe spinal stenosis L4-L5.  Had injections in past with no benefit.  Currently is taking Lyrica 25 MG BID along with Cymbalta for pain.  States on average pain is a 5/10, improved from previous.    In past we have used Wellbutrin, Duloxetine, Prozac, Celexa. Duration:stable Anxious mood: sometimes Excessive worrying: no Irritability: no Sweating: no Nausea: no Palpitations:no Hyperventilation: no Panic attacks: no Agoraphobia: no  Obscessions/compulsions: no Depressed mood: yes    02/14/2023    9:53 AM 09/01/2022    9:51 AM 08/02/2022    9:44 AM 05/23/2022   11:06 AM 05/04/2022    8:53 AM  Depression screen PHQ 2/9  Decreased Interest 3 0 2 0 0  Down, Depressed, Hopeless 3 0 2 1 0  PHQ - 2 Score 6 0 4 1 0  Altered sleeping 3 2 2  0 0  Tired, decreased energy 3 1 2  0 0  Change in appetite 3 1 0 0 0  Feeling bad or failure about yourself  0 0 0 0 0  Trouble concentrating 0 0 0 0 0  Moving slowly or fidgety/restless 1 0 0 0 0  Suicidal thoughts 0 0 0 0 0  PHQ-9 Score 16 4 8 1  0  Difficult doing work/chores Somewhat difficult Not difficult at all Somewhat difficult Not difficult at all Not difficult at all  Anhedonia: no Weight changes: no Insomnia: yes hard to fall asleep -- due to pain Hypersomnia: no Fatigue/loss of energy: no Feelings of worthlessness: no Feelings of guilt: no Impaired concentration/indecisiveness: no Suicidal ideations: no  Crying spells: no Recent Stressors/Life Changes: no   Relationship problems: no   Family stress: no     Financial stress: no    Job stress: no    Recent death/loss: no    2023/02/14    9:54 AM 09/01/2022    9:52 AM 08/02/2022    9:45 AM 05/04/2022     8:54 AM  GAD 7 : Generalized Anxiety Score  Nervous, Anxious, on Edge 1 0 2 0  Control/stop worrying 1 0 2 0  Worry too much - different things 1 1 2  0  Trouble relaxing 0 0 0 0  Restless 0 0 0 0  Easily annoyed or irritable 2 2 2  0  Afraid - awful might happen 0 0 2 0  Total GAD 7 Score 5 3 10  0  Anxiety Difficulty Somewhat difficult Not difficult at all Somewhat difficult Not difficult at all  Relevant past medical, surgical, family and social history reviewed and updated as indicated. Interim medical history since our last visit reviewed. Allergies and medications reviewed and updated.  Review of Systems  Constitutional:  Negative for activity change, appetite change, diaphoresis, fatigue and fever.  Respiratory:  Positive for cough (baseline). Negative for chest tightness, shortness of breath and wheezing.   Cardiovascular:  Negative for chest pain, palpitations and leg swelling.  Gastrointestinal: Negative.   Endocrine: Negative for polydipsia, polyphagia and polyuria.  Musculoskeletal:  Positive for back pain.  Neurological:  Negative for dizziness, syncope, facial asymmetry, weakness, light-headedness, numbness and headaches.  Psychiatric/Behavioral: Negative.  Negative for sleep disturbance and suicidal ideas.     Per HPI unless specifically indicated above     Objective:    BP 122/88 (BP Location: Left Arm, Patient Position: Sitting, Cuff Size: Large)   Pulse 99   Temp 98 F (36.7 C) (Oral)   Ht 5' 7.5" (1.715 m)   Wt 202 lb 9.6 oz (91.9 kg)   SpO2 92%   BMI 31.26 kg/m   Wt Readings from Last 3 Encounters:  02/01/23 202 lb 9.6 oz (91.9 kg)  12/08/22 214 lb (97.1 kg)  12/01/22 215 lb 11.2 oz (97.8 kg)    Physical Exam Vitals and nursing note reviewed.  Constitutional:      General: She is awake. She is not in acute distress.    Appearance: She is well-developed and well-groomed. She is obese. She is not ill-appearing or toxic-appearing.  HENT:     Head:  Normocephalic.     Right Ear: Hearing, ear canal and external ear normal. A middle ear effusion is present. There is no impacted cerumen. Tympanic membrane is injected (mild).     Left Ear: Hearing, ear canal and external ear normal. A middle ear effusion is present. There is no impacted cerumen. Tympanic membrane is injected (mild).     Nose: No rhinorrhea.     Right Sinus: No maxillary sinus tenderness or frontal sinus tenderness.     Left Sinus: No maxillary sinus tenderness or frontal sinus tenderness.     Mouth/Throat:     Mouth: Mucous membranes are moist.     Pharynx: Posterior oropharyngeal erythema (mild) present. No pharyngeal swelling or oropharyngeal exudate.  Eyes:     General: Lids are normal.        Right eye: No discharge.        Left eye: No discharge.     Conjunctiva/sclera: Conjunctivae normal.     Pupils: Pupils are equal, round, and reactive to light.  Neck:     Thyroid: No thyromegaly.     Vascular: No carotid bruit.  Cardiovascular:     Rate and Rhythm: Normal rate and regular rhythm.     Heart sounds: Normal heart sounds. No murmur heard.    No gallop.  Pulmonary:     Effort: Pulmonary effort is normal. No accessory muscle usage or respiratory distress.     Breath sounds: Normal breath sounds. No decreased breath sounds, wheezing or rhonchi.     Comments: Overall clear today. Abdominal:     General: Bowel sounds are normal. There is no distension.     Palpations: Abdomen is soft. There is no hepatomegaly.     Tenderness: There is no abdominal tenderness.  Musculoskeletal:     Cervical back: Normal range of motion and neck supple.     Right lower leg: No edema.     Left lower leg: No  edema.  Lymphadenopathy:     Cervical: No cervical adenopathy.  Skin:    General: Skin is warm and dry.     Findings: Bruising present.     Comments: Scattered bruising upper extremities.  Neurological:     Mental Status: She is alert and oriented to person, place, and  time.     Deep Tendon Reflexes: Reflexes are normal and symmetric.     Reflex Scores:      Brachioradialis reflexes are 2+ on the right side and 2+ on the left side.      Patellar reflexes are 2+ on the right side and 2+ on the left side.    Comments: Oriented to person, place, date, time.  Psychiatric:        Attention and Perception: Attention normal.        Mood and Affect: Mood normal.        Speech: Speech normal.        Behavior: Behavior normal. Behavior is cooperative.        Thought Content: Thought content normal.    Results for orders placed or performed in visit on 02/01/23  Bayer DCA Hb A1c Waived  Result Value Ref Range   HB A1C (BAYER DCA - WAIVED) 5.9 (H) 4.8 - 5.6 %      Assessment & Plan:   Problem List Items Addressed This Visit       Cardiovascular and Mediastinum   Chronic diastolic heart failure [facility])    Per cardiology notes on review.  She was to follow-up with them in 3 months, recommend she call and schedule with them.  At this time continue current medication regimen and recommend: - Reminded to call for an overnight weight gain of >2 pounds or a weekly weight gain of >5 pounds - not adding salt to food and read food labels. Reviewed the importance of keeping daily sodium intake to 2000mg  daily. - Avoid NSAIDs      Hypertension associated with diabetes (HCC)    Chronic, stable.  BP well below goal in office today and at home.  Continue Olmesartan 5 MG daily for kidney protection and Verapamil 80 MG daily, discussed with patient and support system.  Recommend she monitor BP at least a few mornings a week at home and document.  DASH diet at home.  Labs today: CBC and CMP.  Return in 3 months.      Relevant Orders   CBC with Differential/Platelet   Comprehensive metabolic panel   Bayer DCA Hb U9W Waived (Completed)   Type 2 diabetes, controlled, with peripheral circulatory disorder (HCC) - Primary    Chronic, ongoing with A1c stable at 5.9% today,  reduced from 6% last visit.  Is losing weight and occasional dizzy spells. Urine ALB 80 January 2024.  Recommend she continue Olmesartan daily for kidney protection. Has stopped Metformin, recommend we try to stop Januvia due to weight loss and if needed can return to it.  She did not tolerate GLP1 or SGLT2 in past or increase in Metformin.  Check BS BID.  Continue to collaborate with vascular.  - ARB and statin on board. - Vaccinations up to date. - Foot exam up to date.  Needs to schedule eye exam.      Relevant Orders   Bayer DCA Hb A1c Waived (Completed)     Respiratory   Centrilobular emphysema (HCC)    Chronic, ongoing.  Continue Breztri daily and Albuterol as needed.  Recommend complete cessation of smoking.  Continue annual CT scans.  Continue collaboration with pulmonary, which is beneficial.  Recommend she continue visits with them.  To schedule annual lung cancer screening.      Relevant Medications   predniSONE (DELTASONE) 20 MG tablet   Chronic respiratory failure with hypoxia and hypercapnia (HCC)    Continue to collaborate with pulmonary.  Tolerating O2 at night well.        Endocrine   Hyperlipidemia associated with type 2 diabetes mellitus (HCC)    Chronic, ongoing.  Highly recommend she continue Crestor and adjust dosing as needed or add on Zetia if poor control.  Lipid check today.      Relevant Orders   Comprehensive metabolic panel   Lipid Panel w/o Chol/HDL Ratio   Bayer DCA Hb A1c Waived (Completed)     Nervous and Auditory   Nicotine dependence, cigarettes, w unsp disorders    I have recommended complete cessation of tobacco use. I have discussed various options available for assistance with tobacco cessation including over the counter methods (Nicotine gum, patch and lozenges). We also discussed prescription options (Chantix, Nicotine Inhaler / Nasal Spray). The patient is not interested in pursuing any prescription tobacco cessation options at this time.   Continue yearly lung screening.         Other   Generalized anxiety disorder    Chronic, ongoing. Denies SI/HI.  Will continue Duloxetine 60 MG since is offering benefit to mood and chronic back pain -- she does not wish to add on any other medicine, although scores are elevated.  Educated her on this medication and change + side effects and BLACK BOX warning.        Obesity    BMI 31.26 and is losing weight unintentionally.  Recommended eating smaller high protein, low fat meals more frequently and exercising 30 mins a day 5 times a week with a goal of 10-15lb weight loss in the next 3 months. Patient voiced their understanding and motivation to adhere to these recommendations.       S/P lumbar fusion   Weight loss, abnormal    Continues to lose weight unintentionally, reduced appetite.  Has lost 13 pounds since August.  History of Uterine and Colon cancer.  Presented like this when diagnosed she reports.  We discussed at length getting a CT scan abdomen to further assess for any cancerous findings, she does not wish to pursue this or GI.  Reports she would not get treatment if present.  [name] is present in room with her and is HCPOA, aware of her wishes.  She has a DNR at home.  We discussed at length her wishes and she is to alert provider if wishes to pursue assessment in future, but is aware if cancer present and we wait it may be more advanced.  She is able to make her own decisions and very oriented.      Other Visit Diagnoses     Encounter for screening mammogram for malignant neoplasm of breast       Mammogram ordered and intstructed on how to schedule   Relevant Orders   MM 3D SCREENING MAMMOGRAM BILATERAL BREAST         Follow up plan: Return in about 4 weeks (around 03/01/2023) for WEIGHT CHECK + NEEDS 3 month follow-up for T2DM, HTN/HLD, COPD, MOOD, HF.

## 2023-02-01 NOTE — Assessment & Plan Note (Signed)
Continue to collaborate with pulmonary.  Tolerating O2 at night well.

## 2023-02-01 NOTE — Assessment & Plan Note (Signed)
BMI 31.26 and is losing weight unintentionally.  Recommended eating smaller high protein, low fat meals more frequently and exercising 30 mins a day 5 times a week with a goal of 10-15lb weight loss in the next 3 months. Patient voiced their understanding and motivation to adhere to these recommendations.

## 2023-02-01 NOTE — Assessment & Plan Note (Signed)
Chronic, stable.  BP well below goal in office today and at home.  Continue Olmesartan 5 MG daily for kidney protection and Verapamil 80 MG daily, discussed with patient and support system.  Recommend she monitor BP at least a few mornings a week at home and document.  DASH diet at home.  Labs today: CBC and CMP.  Return in 3 months.

## 2023-02-01 NOTE — Assessment & Plan Note (Signed)
Chronic, ongoing with A1c stable at 5.9% today, reduced from 6% last visit.  Is losing weight and occasional dizzy spells. Urine ALB 80 January 2024.  Recommend she continue Olmesartan daily for kidney protection. Has stopped Metformin, recommend we try to stop Januvia due to weight loss and if needed can return to it.  She did not tolerate GLP1 or SGLT2 in past or increase in Metformin.  Check BS BID.  Continue to collaborate with vascular.  - ARB and statin on board. - Vaccinations up to date. - Foot exam up to date.  Needs to schedule eye exam.

## 2023-02-01 NOTE — Assessment & Plan Note (Signed)
Continues to lose weight unintentionally, reduced appetite.  Has lost 13 pounds since August.  History of Uterine and Colon cancer.  Presented like this when diagnosed she reports.  We discussed at length getting a CT scan abdomen to further assess for any cancerous findings, she does not wish to pursue this or GI.  Reports she would not get treatment if present.  [name] is present in room with her and is HCPOA, aware of her wishes.  She has a DNR at home.  We discussed at length her wishes and she is to alert provider if wishes to pursue assessment in future, but is aware if cancer present and we wait it may be more advanced.  She is able to make her own decisions and very oriented.

## 2023-02-01 NOTE — Assessment & Plan Note (Signed)
I have recommended complete cessation of tobacco use. I have discussed various options available for assistance with tobacco cessation including over the counter methods (Nicotine gum, patch and lozenges). We also discussed prescription options (Chantix, Nicotine Inhaler / Nasal Spray). The patient is not interested in pursuing any prescription tobacco cessation options at this time.  Continue yearly lung screening.  

## 2023-02-01 NOTE — Assessment & Plan Note (Signed)
Chronic, ongoing.  Highly recommend she continue Crestor and adjust dosing as needed or add on Zetia if poor control.  Lipid check today.

## 2023-02-01 NOTE — Assessment & Plan Note (Signed)
Per cardiology notes on review.  She was to follow-up with them in 3 months, recommend she call and schedule with them.  At this time continue current medication regimen and recommend: - Reminded to call for an overnight weight gain of >2 pounds or a weekly weight gain of >5 pounds - not adding salt to food and read food labels. Reviewed the importance of keeping daily sodium intake to 2000mg  daily. - Avoid NSAIDs

## 2023-02-01 NOTE — Assessment & Plan Note (Addendum)
Chronic, ongoing.  Continue Breztri daily and Albuterol as needed.  Recommend complete cessation of smoking.  Continue annual CT scans.  Continue collaboration with pulmonary, which is beneficial.  Recommend she continue visits with them.  To schedule annual lung cancer screening.

## 2023-02-02 ENCOUNTER — Encounter: Payer: Self-pay | Admitting: Nurse Practitioner

## 2023-02-02 ENCOUNTER — Other Ambulatory Visit: Payer: Self-pay | Admitting: *Deleted

## 2023-02-02 DIAGNOSIS — Z122 Encounter for screening for malignant neoplasm of respiratory organs: Secondary | ICD-10-CM

## 2023-02-02 DIAGNOSIS — Z87891 Personal history of nicotine dependence: Secondary | ICD-10-CM

## 2023-02-02 DIAGNOSIS — J449 Chronic obstructive pulmonary disease, unspecified: Secondary | ICD-10-CM | POA: Diagnosis not present

## 2023-02-02 DIAGNOSIS — J9611 Chronic respiratory failure with hypoxia: Secondary | ICD-10-CM | POA: Diagnosis not present

## 2023-02-02 DIAGNOSIS — F17210 Nicotine dependence, cigarettes, uncomplicated: Secondary | ICD-10-CM

## 2023-02-02 LAB — COMPREHENSIVE METABOLIC PANEL
ALT: 7 [IU]/L (ref 0–32)
AST: 13 [IU]/L (ref 0–40)
Albumin: 3.6 g/dL — ABNORMAL LOW (ref 3.9–4.9)
Alkaline Phosphatase: 101 [IU]/L (ref 44–121)
BUN/Creatinine Ratio: 9 — ABNORMAL LOW (ref 12–28)
BUN: 7 mg/dL — ABNORMAL LOW (ref 8–27)
Bilirubin Total: 0.3 mg/dL (ref 0.0–1.2)
CO2: 26 mmol/L (ref 20–29)
Calcium: 8.8 mg/dL (ref 8.7–10.3)
Chloride: 99 mmol/L (ref 96–106)
Creatinine, Ser: 0.79 mg/dL (ref 0.57–1.00)
Globulin, Total: 1.9 g/dL (ref 1.5–4.5)
Glucose: 149 mg/dL — ABNORMAL HIGH (ref 70–99)
Potassium: 4.3 mmol/L (ref 3.5–5.2)
Sodium: 139 mmol/L (ref 134–144)
Total Protein: 5.5 g/dL — ABNORMAL LOW (ref 6.0–8.5)
eGFR: 83 mL/min/{1.73_m2} (ref >59)

## 2023-02-02 LAB — LIPID PANEL W/O CHOL/HDL RATIO
Cholesterol, Total: 98 mg/dL — ABNORMAL LOW (ref 100–199)
HDL: 36 mg/dL — ABNORMAL LOW (ref >39)
LDL Chol Calc (NIH): 38 mg/dL (ref 0–99)
Triglycerides: 138 mg/dL (ref 0–149)
VLDL Cholesterol Cal: 24 mg/dL (ref 5–40)

## 2023-02-02 LAB — CBC WITH DIFFERENTIAL/PLATELET
Basophils Absolute: 0 10*3/uL (ref 0.0–0.2)
Basos: 1 %
EOS (ABSOLUTE): 0.1 10*3/uL (ref 0.0–0.4)
Eos: 1 %
Hematocrit: 43.9 % (ref 34.0–46.6)
Hemoglobin: 13.4 g/dL (ref 11.1–15.9)
Immature Grans (Abs): 0 10*3/uL (ref 0.0–0.1)
Immature Granulocytes: 1 %
Lymphocytes Absolute: 1.6 10*3/uL (ref 0.7–3.1)
Lymphs: 19 %
MCH: 28.7 pg (ref 26.6–33.0)
MCHC: 30.5 g/dL — ABNORMAL LOW (ref 31.5–35.7)
MCV: 94 fL (ref 79–97)
Monocytes Absolute: 0.5 10*3/uL (ref 0.1–0.9)
Monocytes: 6 %
Neutrophils Absolute: 6.3 10*3/uL (ref 1.4–7.0)
Neutrophils: 72 %
Platelets: 343 10*3/uL (ref 150–450)
RBC: 4.67 x10E6/uL (ref 3.77–5.28)
RDW: 12.4 % (ref 11.7–15.4)
WBC: 8.5 10*3/uL (ref 3.4–10.8)

## 2023-02-02 NOTE — Progress Notes (Signed)
Contacted via MyChart   Good evening Laketa, your labs have returned and overall these look stable.  Continue all current medications.  Any questions? Keep being amazing!!  Thank you for allowing me to participate in your care.  I appreciate you. Kindest regards, Glynis Hunsucker

## 2023-02-07 ENCOUNTER — Ambulatory Visit: Admission: RE | Admit: 2023-02-07 | Payer: 59 | Source: Ambulatory Visit

## 2023-02-09 ENCOUNTER — Other Ambulatory Visit: Payer: Self-pay | Admitting: Nurse Practitioner

## 2023-02-09 NOTE — Telephone Encounter (Signed)
Requested medication (s) are due for refill today: yes  Requested medication (s) are on the active medication list: yes  Last refill:  09/01/22  Future visit scheduled: yes  Notes to clinic:  Unable to refill per protocol, cannot delegate.      Requested Prescriptions  Pending Prescriptions Disp Refills   pregabalin (LYRICA) 25 MG capsule [Pharmacy Med Name: PREGABALIN 25 MG CAP] 60 capsule     Sig: TAKE 1 CAPSULE BY MOUTH TWICE DAILY     Not Delegated - Neurology:  Anticonvulsants - Controlled - pregabalin Failed - 02/09/2023  9:27 AM      Failed - This refill cannot be delegated      Passed - Cr in normal range and within 360 days    Creat  Date Value Ref Range Status  07/02/2013 0.79 0.50 - 1.10 mg/dL Final   Creatinine, Ser  Date Value Ref Range Status  02/01/2023 0.79 0.57 - 1.00 mg/dL Final         Passed - Completed PHQ-2 or PHQ-9 in the last 360 days      Passed - Valid encounter within last 12 months    Recent Outpatient Visits           1 week ago Type 2 diabetes, controlled, with peripheral circulatory disorder (HCC)   [facility] [place], [place] T, NP   3 months ago Type 2 diabetes, controlled, with peripheral circulatory disorder (HCC)   [place] [facility] [place], [place] T, NP   4 months ago Chronic bilateral low back pain without sciatica   [place] [facility] [place], Roe T, NP   5 months ago Hypertension associated with diabetes (HCC)   [place] [facility] [place], Bull[place] T, NP   6 months ago Type 2 diabetes, controlled, with peripheral circulatory disorder (HCC)   [facility] [place], Dorie Rank, NP       Future Appointments             In 2 weeks Cann[place], Dorie Rank, NP  [facility]   In 2 months Hoyt, Dorie Rank, NP  [facility]

## 2023-02-26 NOTE — Patient Instructions (Signed)
Be Involved in Caring For Your Health:  Taking Medications When medications are taken as directed, they can greatly improve your health. But if they are not taken as prescribed, they may not work. In some cases, not taking them correctly can be harmful. To help ensure your treatment remains effective and safe, understand your medications and how to take them. Bring your medications to each visit for review by your provider.  Your lab results, notes, and after visit summary will be available on My Chart. We strongly encourage you to use this feature. If lab results are abnormal the clinic will contact you with the appropriate steps. If the clinic does not contact you assume the results are satisfactory. You can always view your results on My Chart. If you have questions regarding your health or results, please contact the clinic during office hours. You can also ask questions on My Chart.  We at [facility] are grateful that you chose [place] to provide your care. We strive to provide evidence-based and compassionate care and are always looking for feedback. If you get a survey from the clinic please complete this so we can hear your opinions.  Healthy Eating, Adult Healthy eating may help you get and keep a healthy body weight, reduce the risk of chronic disease, and live a long and productive life. It is important to follow a healthy eating pattern. Your nutritional and calorie needs should be met mainly by different nutrient-rich foods. What are tips for following this plan? Reading food labels Read labels and choose the following: Reduced or low sodium products. Juices with 100% fruit juice. Foods with low saturated fats (<3 g per serving) and high polyunsaturated and monounsaturated fats. Foods with whole grains, such as whole wheat, cracked wheat, brown rice, and wild rice. Whole grains that are fortified with folic acid. This is recommended for females who are pregnant or who want to  become pregnant. Read labels and do not eat or drink the following: Foods or drinks with added sugars. These include foods that contain brown sugar, corn sweetener, corn syrup, dextrose, fructose, glucose, high-fructose corn syrup, honey, invert sugar, lactose, malt syrup, maltose, molasses, raw sugar, sucrose, trehalose, or turbinado sugar. Limit your intake of added sugars to less than 10% of your total daily calories. Do not eat more than the following amounts of added sugar per day: 6 teaspoons (25 g) for females. 9 teaspoons (38 g) for males. Foods that contain processed or refined starches and grains. Refined grain products, such as white flour, degermed cornmeal, white bread, and white rice. Shopping Choose nutrient-rich snacks, such as vegetables, whole fruits, and nuts. Avoid high-calorie and high-sugar snacks, such as potato chips, fruit snacks, and candy. Use oil-based dressings and spreads on foods instead of solid fats such as butter, margarine, sour cream, or cream cheese. Limit pre-made sauces, mixes, and "instant" products such as flavored rice, instant noodles, and ready-made pasta. Try more plant-protein sources, such as tofu, tempeh, black beans, edamame, lentils, nuts, and seeds. Explore eating plans such as the Mediterranean diet or vegetarian diet. Try heart-healthy dips made with beans and healthy fats like hummus and guacamole. Vegetables go great with these. Cooking Use oil to saut or stir-fry foods instead of solid fats such as butter, margarine, or lard. Try baking, boiling, grilling, or broiling instead of frying. Remove the fatty part of meats before cooking. Steam vegetables in water or broth. Meal planning  At meals, imagine dividing your plate into fourths: One-half of  your plate is fruits and vegetables. One-fourth of your plate is whole grains. One-fourth of your plate is protein, especially lean meats, poultry, eggs, tofu, beans, or nuts. Include low-fat  dairy as part of your daily diet. Lifestyle Choose healthy options in all settings, including home, work, school, restaurants, or stores. Prepare your food safely: Wash your hands after handling raw meats. Where you prepare food, keep surfaces clean by regularly washing with hot, soapy water. Keep raw meats separate from ready-to-eat foods, such as fruits and vegetables. Cook seafood, meat, poultry, and eggs to the recommended temperature. Get a food thermometer. Store foods at safe temperatures. In general: Keep cold foods at 48F (4.4C) or below. Keep hot foods at 148F (60C) or above. Keep your freezer at [facility] (-17.8C) or below. Foods are not safe to eat if they have been between the temperatures of 40-148F (4.4-60C) for more than 2 hours. What foods should I eat? Fruits Aim to eat 1-2 cups of fresh, canned (in natural juice), or frozen fruits each day. One cup of fruit equals 1 small apple, 1 large banana, 8 large strawberries, 1 cup (237 g) canned fruit,  cup (82 g) dried fruit, or 1 cup (240 mL) 100% juice. Vegetables Aim to eat 2-4 cups of fresh and frozen vegetables each day, including different varieties and colors. One cup of vegetables equals 1 cup (91 g) broccoli or cauliflower florets, 2 medium carrots, 2 cups (150 g) raw, leafy greens, 1 large tomato, 1 large bell pepper, 1 large sweet potato, or 1 medium white potato. Grains Aim to eat 5-10 ounce-equivalents of whole grains each day. Examples of 1 ounce-equivalent of grains include 1 slice of bread, 1 cup (40 g) ready-to-eat cereal, 3 cups (24 g) popcorn, or  cup (93 g) cooked rice. Meats and other proteins Try to eat 5-7 ounce-equivalents of protein each day. Examples of 1 ounce-equivalent of protein include 1 egg,  oz nuts (12 almonds, 24 pistachios, or 7 walnut halves), 1/4 cup (90 g) cooked beans, 6 tablespoons (90 g) hummus or 1 tablespoon (16 g) peanut butter. A cut of meat or fish that is the size of a deck of  cards is about 3-4 ounce-equivalents (85 g). Of the protein you eat each week, try to have at least 8 sounce (227 g) of seafood. This is about 2 servings per week. This includes salmon, trout, herring, sardines, and anchovies. Dairy Aim to eat 3 cup-equivalents of fat-free or low-fat dairy each day. Examples of 1 cup-equivalent of dairy include 1 cup (240 mL) milk, 8 ounces (250 g) yogurt, 1 ounces (44 g) natural cheese, or 1 cup (240 mL) fortified soy milk. Fats and oils Aim for about 5 teaspoons (21 g) of fats and oils per day. Choose monounsaturated fats, such as canola and olive oils, mayonnaise made with olive oil or avocado oil, avocados, peanut butter, and most nuts, or polyunsaturated fats, such as sunflower, corn, and soybean oils, walnuts, pine nuts, sesame seeds, sunflower seeds, and flaxseed. Beverages Aim for 6 eight-ounce glasses of water per day. Limit coffee to 3-5 eight-ounce cups per day. Limit caffeinated beverages that have added calories, such as soda and energy drinks. If you drink alcohol: Limit how much you have to: 0-1 drink a day if you are female. 0-2 drinks a day if you are female. Know how much alcohol is in your drink. In the [place] one drink is one 12 oz bottle of beer (355 mL), one 5 oz glass of wine (  148 mL), or one 1 oz glass of hard liquor (44 mL). Seasoning and other foods Try not to add too much salt to your food. Try using herbs and spices instead of salt. Try not to add sugar to food. This information is based on [place] nutrition guidelines. To learn more, visit [employer]. Exact amounts may vary. You may need different amounts. This information is not intended to replace advice given to you by your health care provider. Make sure you discuss any questions you have with your health care provider. Document Revised: 12/20/2021 Document Reviewed: 12/20/2021 Elsevier Patient Education  2024 [employer].

## 2023-03-01 ENCOUNTER — Emergency Department: Payer: 59

## 2023-03-01 ENCOUNTER — Ambulatory Visit (INDEPENDENT_AMBULATORY_CARE_PROVIDER_SITE_OTHER): Payer: 59 | Admitting: Nurse Practitioner

## 2023-03-01 ENCOUNTER — Other Ambulatory Visit: Payer: Self-pay

## 2023-03-01 ENCOUNTER — Encounter: Payer: Self-pay | Admitting: Nurse Practitioner

## 2023-03-01 ENCOUNTER — Observation Stay
Admission: EM | Admit: 2023-03-01 | Discharge: 2023-03-02 | Disposition: A | Discharge status: Home / Self Care | Payer: 59 | Attending: Internal Medicine | Admitting: Internal Medicine

## 2023-03-01 VITALS — BP 95/63 | HR 90 | Temp 98.0°F | Wt 208.2 lb

## 2023-03-01 DIAGNOSIS — R519 Headache, unspecified: Secondary | ICD-10-CM | POA: Diagnosis not present

## 2023-03-01 DIAGNOSIS — F17210 Nicotine dependence, cigarettes, uncomplicated: Secondary | ICD-10-CM | POA: Insufficient documentation

## 2023-03-01 DIAGNOSIS — F17219 Nicotine dependence, cigarettes, with unspecified nicotine-induced disorders: Secondary | ICD-10-CM | POA: Diagnosis present

## 2023-03-01 DIAGNOSIS — J432 Centrilobular emphysema: Secondary | ICD-10-CM | POA: Diagnosis not present

## 2023-03-01 DIAGNOSIS — J441 Chronic obstructive pulmonary disease with (acute) exacerbation: Principal | ICD-10-CM | POA: Insufficient documentation

## 2023-03-01 DIAGNOSIS — Z79899 Other long term (current) drug therapy: Secondary | ICD-10-CM | POA: Diagnosis not present

## 2023-03-01 DIAGNOSIS — J9621 Acute and chronic respiratory failure with hypoxia: Secondary | ICD-10-CM | POA: Insufficient documentation

## 2023-03-01 DIAGNOSIS — Z720 Tobacco use: Secondary | ICD-10-CM | POA: Diagnosis not present

## 2023-03-01 DIAGNOSIS — Z85028 Personal history of other malignant neoplasm of stomach: Secondary | ICD-10-CM | POA: Insufficient documentation

## 2023-03-01 DIAGNOSIS — I5032 Chronic diastolic (congestive) heart failure: Secondary | ICD-10-CM

## 2023-03-01 DIAGNOSIS — R9431 Abnormal electrocardiogram [ECG] [EKG]: Secondary | ICD-10-CM | POA: Insufficient documentation

## 2023-03-01 DIAGNOSIS — Z96611 Presence of right artificial shoulder joint: Secondary | ICD-10-CM | POA: Diagnosis not present

## 2023-03-01 DIAGNOSIS — R590 Localized enlarged lymph nodes: Secondary | ICD-10-CM | POA: Diagnosis not present

## 2023-03-01 DIAGNOSIS — R634 Abnormal weight loss: Secondary | ICD-10-CM | POA: Diagnosis not present

## 2023-03-01 DIAGNOSIS — I110 Hypertensive heart disease with heart failure: Secondary | ICD-10-CM | POA: Diagnosis not present

## 2023-03-01 DIAGNOSIS — K219 Gastro-esophageal reflux disease without esophagitis: Secondary | ICD-10-CM | POA: Diagnosis present

## 2023-03-01 DIAGNOSIS — J9811 Atelectasis: Secondary | ICD-10-CM | POA: Diagnosis not present

## 2023-03-01 DIAGNOSIS — R079 Chest pain, unspecified: Secondary | ICD-10-CM | POA: Diagnosis not present

## 2023-03-01 DIAGNOSIS — I5030 Unspecified diastolic (congestive) heart failure: Secondary | ICD-10-CM

## 2023-03-01 DIAGNOSIS — R918 Other nonspecific abnormal finding of lung field: Secondary | ICD-10-CM | POA: Diagnosis not present

## 2023-03-01 DIAGNOSIS — I2510 Atherosclerotic heart disease of native coronary artery without angina pectoris: Secondary | ICD-10-CM | POA: Insufficient documentation

## 2023-03-01 DIAGNOSIS — Z1152 Encounter for screening for COVID-19: Secondary | ICD-10-CM | POA: Diagnosis not present

## 2023-03-01 DIAGNOSIS — M50222 Other cervical disc displacement at C5-C6 level: Secondary | ICD-10-CM | POA: Diagnosis not present

## 2023-03-01 DIAGNOSIS — J90 Pleural effusion, not elsewhere classified: Secondary | ICD-10-CM | POA: Diagnosis not present

## 2023-03-01 DIAGNOSIS — M4312 Spondylolisthesis, cervical region: Secondary | ICD-10-CM | POA: Diagnosis not present

## 2023-03-01 DIAGNOSIS — R55 Syncope and collapse: Secondary | ICD-10-CM | POA: Diagnosis not present

## 2023-03-01 DIAGNOSIS — R0602 Shortness of breath: Secondary | ICD-10-CM | POA: Diagnosis present

## 2023-03-01 DIAGNOSIS — Z471 Aftercare following joint replacement surgery: Secondary | ICD-10-CM | POA: Diagnosis not present

## 2023-03-01 DIAGNOSIS — E1169 Type 2 diabetes mellitus with other specified complication: Secondary | ICD-10-CM | POA: Diagnosis present

## 2023-03-01 DIAGNOSIS — E1151 Type 2 diabetes mellitus with diabetic peripheral angiopathy without gangrene: Secondary | ICD-10-CM | POA: Diagnosis present

## 2023-03-01 DIAGNOSIS — Z91040 Latex allergy status: Secondary | ICD-10-CM | POA: Insufficient documentation

## 2023-03-01 DIAGNOSIS — J439 Emphysema, unspecified: Secondary | ICD-10-CM | POA: Diagnosis not present

## 2023-03-01 LAB — COMPREHENSIVE METABOLIC PANEL
ALT: 8 U/L (ref 0–44)
AST: 15 U/L (ref 15–41)
Albumin: 3.5 g/dL (ref 3.5–5.0)
Alkaline Phosphatase: 93 U/L (ref 38–126)
Anion gap: 8 (ref 5–15)
BUN: 9 mg/dL (ref 8–23)
CO2: 30 mmol/L (ref 22–32)
Calcium: 8.3 mg/dL — ABNORMAL LOW (ref 8.9–10.3)
Chloride: 99 mmol/L (ref 98–111)
Creatinine, Ser: 0.87 mg/dL (ref 0.44–1.00)
GFR, Estimated: >60 mL/min (ref >60)
Glucose, Bld: 149 mg/dL — ABNORMAL HIGH (ref 70–99)
Potassium: 3.7 mmol/L (ref 3.5–5.1)
Sodium: 137 mmol/L (ref 135–145)
Total Bilirubin: 0.8 mg/dL (ref <1.2)
Total Protein: 6.5 g/dL (ref 6.5–8.1)

## 2023-03-01 LAB — CBC
HCT: 43.7 % (ref 36.0–46.0)
Hemoglobin: 13.8 g/dL (ref 12.0–15.0)
MCH: 29.1 pg (ref 26.0–34.0)
MCHC: 31.6 g/dL (ref 30.0–36.0)
MCV: 92.2 fL (ref 80.0–100.0)
Platelets: 288 10*3/uL (ref 150–400)
RBC: 4.74 MIL/uL (ref 3.87–5.11)
RDW: 15 % (ref 11.5–15.5)
WBC: 8.8 10*3/uL (ref 4.0–10.5)
nRBC: 0 % (ref 0.0–0.2)

## 2023-03-01 LAB — RESP PANEL BY RT-PCR (RSV, FLU A&B, COVID)  RVPGX2
Influenza A by PCR: NEGATIVE
Influenza B by PCR: NEGATIVE
Resp Syncytial Virus by PCR: NEGATIVE
SARS Coronavirus 2 by RT PCR: NEGATIVE

## 2023-03-01 LAB — HIV ANTIBODY (ROUTINE TESTING W REFLEX): HIV Screen 4th Generation wRfx: NONREACTIVE

## 2023-03-01 LAB — BRAIN NATRIURETIC PEPTIDE: B Natriuretic Peptide: 91.5 pg/mL (ref 0.0–100.0)

## 2023-03-01 LAB — MAGNESIUM: Magnesium: 1.7 mg/dL (ref 1.7–2.4)

## 2023-03-01 LAB — D-DIMER, QUANTITATIVE: D-Dimer, Quant: 2.13 ug{FEU}/mL — ABNORMAL HIGH (ref 0.00–0.50)

## 2023-03-01 LAB — TROPONIN I (HIGH SENSITIVITY)
Troponin I (High Sensitivity): 20 ng/L — ABNORMAL HIGH (ref <18)
Troponin I (High Sensitivity): 18 ng/L — ABNORMAL HIGH (ref <18)

## 2023-03-01 MED ORDER — IPRATROPIUM-ALBUTEROL 0.5-2.5 (3) MG/3ML IN SOLN
3.0000 mL | Freq: Once | RESPIRATORY_TRACT | Status: AC
  Administered 2023-03-01: 3 mL via RESPIRATORY_TRACT
  Filled 2023-03-01: qty 3

## 2023-03-01 MED ORDER — SODIUM CHLORIDE 0.9% FLUSH
3.0000 mL | Freq: Two times a day (BID) | INTRAVENOUS | Status: DC
  Administered 2023-03-01 – 2023-03-02 (×3): 3 mL via INTRAVENOUS

## 2023-03-01 MED ORDER — METHYLPREDNISOLONE SODIUM SUCC 125 MG IJ SOLR
125.0000 mg | Freq: Once | INTRAMUSCULAR | Status: AC
  Administered 2023-03-01: 125 mg via INTRAVENOUS
  Filled 2023-03-01: qty 2

## 2023-03-01 MED ORDER — SODIUM CHLORIDE 0.9 % IV SOLN
2.0000 g | INTRAVENOUS | Status: DC
  Administered 2023-03-01: 2 g via INTRAVENOUS
  Filled 2023-03-01 (×2): qty 20

## 2023-03-01 MED ORDER — ALBUTEROL SULFATE (2.5 MG/3ML) 0.083% IN NEBU: 2.5000 mg | INHALATION_SOLUTION | RESPIRATORY_TRACT | Status: DC | PRN

## 2023-03-01 MED ORDER — IPRATROPIUM-ALBUTEROL 0.5-2.5 (3) MG/3ML IN SOLN
3.0000 mL | Freq: Four times a day (QID) | RESPIRATORY_TRACT | Status: DC
Start: 2023-03-01 — End: 2023-03-02
  Administered 2023-03-01 – 2023-03-02 (×4): 3 mL via RESPIRATORY_TRACT
  Filled 2023-03-01 (×4): qty 3

## 2023-03-01 MED ORDER — ENOXAPARIN SODIUM 60 MG/0.6ML IJ SOSY
0.5000 mg/kg | PREFILLED_SYRINGE | INTRAMUSCULAR | Status: DC
  Administered 2023-03-01: 47.5 mg via SUBCUTANEOUS
  Filled 2023-03-01: qty 0.6

## 2023-03-01 MED ORDER — AZITHROMYCIN 500 MG PO TABS: 500.0000 mg | ORAL_TABLET | Freq: Every day | ORAL | Status: DC

## 2023-03-01 MED ORDER — ONDANSETRON HCL 4 MG PO TABS: 4.0000 mg | ORAL_TABLET | Freq: Four times a day (QID) | ORAL | Status: DC | PRN

## 2023-03-01 MED ORDER — METHYLPREDNISOLONE SODIUM SUCC 125 MG IJ SOLR
80.0000 mg | Freq: Every day | INTRAMUSCULAR | Status: AC
  Administered 2023-03-01: 80 mg via INTRAVENOUS
  Filled 2023-03-01: qty 2

## 2023-03-01 MED ORDER — ONDANSETRON HCL 4 MG/2ML IJ SOLN: 4.0000 mg | Freq: Four times a day (QID) | INTRAMUSCULAR | Status: DC | PRN

## 2023-03-01 MED ORDER — SODIUM CHLORIDE 0.9 % IV SOLN
500.0000 mg | INTRAVENOUS | Status: DC
  Filled 2023-03-01: qty 5

## 2023-03-01 MED ORDER — PREDNISONE 20 MG PO TABS
40.0000 mg | ORAL_TABLET | Freq: Every day | ORAL | Status: DC
  Administered 2023-03-02: 40 mg via ORAL
  Filled 2023-03-01: qty 2

## 2023-03-01 MED ORDER — IOHEXOL 350 MG/ML SOLN
75.0000 mL | Freq: Once | INTRAVENOUS | Status: AC | PRN
  Administered 2023-03-01: 75 mL via INTRAVENOUS

## 2023-03-01 NOTE — Assessment & Plan Note (Signed)
Continues to lose weight unintentionally, reduced appetite.  Has lost 13 pounds since August, but gained 6 lbs back this visit which suspect is related to edema.  History of Uterine and Colon cancer.  Presented like this when diagnosed she reports.  She is agreeable to going to ER today for further work-up, PCP discussed a length concerns with her.  Reports she would not get treatment if present.  [name] is present in room with her and is HCPOA, aware of her wishes.  She has a DNR at home. She is able to make her own decisions and very oriented.

## 2023-03-01 NOTE — ED Provider Notes (Addendum)
[facility] Provider Note    Event Date/Time   First MD Initiated Contact with Patient 03/01/23 1101     (approximate)   History   Chest Pain and Shortness of Breath   HPI  Stacey Gross is a 62 y.o. female with type 2 diabetes, COPD on 2 L at nighttime, diastolic heart failure who comes in with chest pain, shortness of breath.  Patient reports having some shortness of breath over the past 1 month.  She reports having some intermittent chest pain as well.  She states that maybe got a little bit worse last night and she was following up with her primary care doctor and they noted her oxygen levels were in the 70s.  She reports that typically she uses oxygen at nighttime but is never been told to use it during the day.  She still does smoke daily with 1 to 2 packs/day.  She denies any history of blood clots.  They report some swelling her legs that only come down if she has been sitting in the bed for a few days otherwise when she starts to ambulate the swelling comes back.  They report some recurrent episodes of syncope.  They report that this last happened 3 days ago.  Unclear if she hit her head but they deny any headaches or confusion since then.   Physical Exam   Triage Vital Signs: ED Triage Vitals  Encounter Vitals Group     BP 03/01/23 1050 119/74     Systolic BP Percentile --      Diastolic BP Percentile --      Pulse Rate 03/01/23 1050 88     Resp 03/01/23 1050 (!) 22     Temp 03/01/23 1050 97.9 F (36.6 C)     Temp Source 03/01/23 1050 Oral     SpO2 03/01/23 1046 (!) 86 %     Weight 03/01/23 1050 208 lb (94.3 kg)     Height 03/01/23 1050 5\' 7"  (1.702 m)     Head Circumference --      Peak Flow --      Pain Score 03/01/23 1050 0     Pain Loc --      Pain Education --      Exclude from Growth Chart --     Most recent vital signs: Vitals:   03/01/23 1046 03/01/23 1050  BP:  119/74  Pulse:  88  Resp:  (!) 22  Temp:  97.9 F (36.6 C)   SpO2: (!) 86% 92%     General: Awake, no distress.  CV:  Good peripheral perfusion.  Resp:  Normal effort.  Wheezing noted bilaterally Abd:  No distention.  Soft and nontender Other:  Some trace edema noted.  No calf tenderness   ED Results / Procedures / Treatments   Labs (all labs ordered are listed, but only abnormal results are displayed) Labs Reviewed  RESP PANEL BY RT-PCR (RSV, FLU A&B, COVID)  [id number]  COMPREHENSIVE METABOLIC PANEL  CBC  D-DIMER, QUANTITATIVE  BRAIN NATRIURETIC PEPTIDE  TROPONIN I (HIGH SENSITIVITY)     EKG  My interpretation of EKG:  Sinus rate of 90 without any ST elevations or T wave inversions, QTc prolonged at 591  RADIOLOGY I have reviewed the xray personally and interpreted and no PNA    PROCEDURES:  Critical Care performed: No  Procedures   MEDICATIONS ORDERED IN ED: Medications  ipratropium-albuterol (DUONEB) 0.5-2.5 (3) MG/3ML nebulizer solution 3 mL (3  mLs Nebulization Given 03/01/23 1133)  ipratropium-albuterol (DUONEB) 0.5-2.5 (3) MG/3ML nebulizer solution 3 mL (3 mLs Nebulization Given 03/01/23 1133)  ipratropium-albuterol (DUONEB) 0.5-2.5 (3) MG/3ML nebulizer solution 3 mL (3 mLs Nebulization Given 03/01/23 1132)  methylPREDNISolone sodium succinate (SOLU-MEDROL) 125 mg/2 mL injection 125 mg (125 mg Intravenous Given 03/01/23 1133)     IMPRESSION / MDM / ASSESSMENT AND PLAN / ED COURSE  I reviewed the triage vital signs and the nursing notes.   Patient's presentation is most consistent with acute presentation with potential threat to life or bodily function.   Patient comes in with new oxygen requirement during the daytime.  On 2 L at nighttime.  Suspect related to COPD but will get workup to evaluate for ACS, CHF, blood clot.  Patient does have some mild wheezing noted will give some breathing treatments, steroids.  I suspect the syncope is related to hypoxia with ambulation.  Magnesium was normal overall, COVID, flu  were normal.  CMP reassuring CBC reassuring troponin was elevated  I did discuss the enlarged lymph node and patient is aware.  I discussed admission for cardiac monitoring, echocardiogram I reviewed her echo cardiogram from over a year ago where she had grade 2 diastolic dysfunction.  MPRESSION: 1. No embolism to the proximal subsegmental pulmonary artery level. 2. There is small right pleural effusion with associated compressive atelectatic changes in the right lung lower lobe. No left pleural effusion. No lung mass, consolidation or pneumothorax. No suspicious lung nodules. 3. Multiple other nonacute observations, as described above.  Discussed with pt she is willing to stay in hospital for new oxygen requirement.   The patient is on the cardiac monitor to evaluate for evidence of arrhythmia and/or significant heart rate changes.      FINAL CLINICAL IMPRESSION(S) / ED DIAGNOSES   Final diagnoses:  COPD exacerbation (HCC)     Rx / DC Orders   ED Discharge Orders     None        Note:  This document was prepared using Dragon voice recognition software and may include unintentional dictation errors.   Concha Se, MD 03/01/23 1457    Concha Se, MD 03/01/23 [phone]

## 2023-03-01 NOTE — Progress Notes (Signed)
PHARMACIST - PHYSICIAN COMMUNICATION  CONCERNING:  Enoxaparin (Lovenox) for DVT Prophylaxis    RECOMMENDATION: Patient was prescribed enoxaprin 40mg  q24 hours for VTE prophylaxis.   Filed Weights   03/01/23 1050  Weight: 94.3 kg (208 lb)    Body mass index is 32.58 kg/m.  Estimated Creatinine Clearance: 77 mL/min (by C-G formula based on SCr of 0.87 mg/dL).   Based on [facility] policy patient is candidate for enoxaparin 0.5mg /kg TBW SQ every 24 hours based on BMI being >30.  DESCRIPTION: Pharmacy has adjusted enoxaparin dose per [facility] policy.  Patient is now receiving enoxaparin 47.5 mg every 24 hours    Rockwell Alexandria, PharmD Clinical Pharmacist  03/01/2023 3:52 PM

## 2023-03-01 NOTE — Discharge Instructions (Addendum)
  Talk to your primary team about setting you up with a portable oxygen tank.   1. No acute intracranial abnormality. 2. No acute fracture or traumatic malalignment of the cervical spine. 3. Nonspecific asymmetrically enlarged lymph node in the right infra parotid region measuring up to 1.1 cm. This may be reactive, but clinical follow to resolution is recommended.

## 2023-03-01 NOTE — Assessment & Plan Note (Signed)
2D echo July 2023 with a EF of 50 to 55% and grade 2 diastolic dysfunction Appears fairly euvolemic to mildly dry on presentation Gentle hydration in the setting of syncope Monitor volume status closely Follow

## 2023-03-01 NOTE — H&P (Signed)
History and Physical    Patient: Stacey Gross ZDG:[id number] DOB: 12/05/1960 DOA: 03/01/2023 DOS: the patient was seen and examined on 03/01/2023 PCP: Marjie Skiff, NP  Patient coming from: Home  Chief Complaint:  Chief Complaint  Patient presents with   Chest Pain   Shortness of Breath   HPI: Stacey Gross is a 62 y.o. female with medical history significant of COPD, 2 L home O2 at night, HFpEF, hyperlipidemia, hypertension, peripheral vascular disease, tobacco abuse presenting with acute on chronic respiratory failure with hypoxia, syncope, COPD exacerbation.  History from patient as well as daughter at the bedside.  Per report, patient with worsening shortness of breath, cough wheezing over the course of the past several days.  Has not been wearing nighttime oxygen per report.  Still smoking up to 1 to 2 packs/day.  Has also had 2 episodes of syncope during the same timeframe.  Episodes usually happen when patient has prolonged standing with generalized shaking.  Patient does not remember what happens associated with episode.  Denies any known prodrome including weakness, dizziness, chest pain or shortness of breath.  No reported recent change in medication.  Positive decreased appetite over the past week or so.?  Diarrhea.  No focal hemiparesis or confusion.  No reported slurred speech.  No reported direct head trauma.  Was seen by PCP today and redirected to the ER because of persistent hypoxia with O2 sats in the 70s to 80s. Presented to the ER afebrile, hemodynamically stable.  Satting 86% on room air.  Transition to 2 to 3 L nasal cannula to keep O2 sats greater than 90%.  White count 8.8, hemoglobin 13.8, platelets 288, troponin 20-18.  COVID flu and RSV negative.  Creatinine 0.9.  D-dimer 2.13.  CT angio of the chest negative for PE.  Noted small pleural effusion.  CT head and CT C-spine also grossly stable. Review of Systems: As mentioned in the history of present illness. All  other systems reviewed and are negative. Past Medical History:  Diagnosis Date   (HFpEF) heart failure with preserved ejection fraction (HCC)    Asthma    Cancer (HCC)    stomach   Chronic back pain    COPD (chronic obstructive pulmonary disease) (HCC)    Degenerative joint disease (DJD) of hip    Diabetes mellitus without complication (HCC)    Dyspnea    GERD (gastroesophageal reflux disease)    takes Omeprazole daily   History of bronchitis yrs ago   History of colon polyps    benign   History of staph infection 10 yrs ago   Hyperlipidemia    takes Atorvastatin daily   Hypertension    Palpitations    Peripheral vascular disease (HCC)    Tobacco use    8 cigarettes daily    Weakness    numbness and tingling in both legs    Wears dentures    full upper   Past Surgical History:  Procedure Laterality Date   APPENDECTOMY     BACK SURGERY  9-10 yrs ago   BREAST BIOPSY Right    Core- neg   COLONOSCOPY     COLONOSCOPY WITH PROPOFOL N/A 09/20/2017   Procedure: COLONOSCOPY WITH PROPOFOL;  Surgeon: Pasty Spillers, MD;  Location: [facility] ENDOSCOPY;  Service: Endoscopy;  Laterality: N/A;   COLONOSCOPY WITH PROPOFOL N/A 04/24/2018   Procedure: COLONOSCOPY WITH PROPOFOL;  Surgeon: Midge Minium, MD;  Location: [facility] ENDOSCOPY;  Service: Endoscopy;  Laterality: N/A;  COLONOSCOPY WITH PROPOFOL N/A 06/10/2021   Procedure: COLONOSCOPY WITH BIOPSY;  Surgeon: Midge Minium, MD;  Location: [facility];  Service: Endoscopy;  Laterality: N/A;   ESOPHAGEAL DILATION  06/10/2021   Procedure: ESOPHAGEAL DILATION;  Surgeon: Midge Minium, MD;  Location: [facility] SURGERY CNTR;  Service: Endoscopy;;  15-18 mm   ESOPHAGOGASTRODUODENOSCOPY (EGD) WITH PROPOFOL N/A 09/20/2017   Procedure: ESOPHAGOGASTRODUODENOSCOPY (EGD) WITH PROPOFOL;  Surgeon: Pasty Spillers, MD;  Location: [facility] ENDOSCOPY;  Service: Endoscopy;  Laterality: N/A;   ESOPHAGOGASTRODUODENOSCOPY (EGD) WITH PROPOFOL N/A  11/20/2018   Procedure: ESOPHAGOGASTRODUODENOSCOPY (EGD) WITH PROPOFOL;  Surgeon: Midge Minium, MD;  Location: [facility] ENDOSCOPY;  Service: Endoscopy;  Laterality: N/A;   ESOPHAGOGASTRODUODENOSCOPY (EGD) WITH PROPOFOL N/A 06/10/2021   Procedure: ESOPHAGOGASTRODUODENOSCOPY (EGD) WITH BIOPSY;  Surgeon: Midge Minium, MD;  Location: [facility] SURGERY [facility];  Service: Endoscopy;  Laterality: N/A;  Diabetic Latex   EYE SURGERY Bilateral    cataract extraction   PERIPHERAL VASCULAR CATHETERIZATION Left 03/17/2016   Procedure: Lower Extremity Angiography;  Surgeon: Annice Needy, MD;  Location: [facility] INVASIVE CV LAB;  Service: Cardiovascular;  Laterality: Left;   POLYPECTOMY N/A 06/10/2021   Procedure: POLYPECTOMY;  Surgeon: Midge Minium, MD;  Location: [facility] SUR[facility]ERY [facility];  Service: Endoscopy;  Laterality: N/A;   TOTAL ABDOMINAL HYSTERECTOMY W/ BILATERAL SALPINGOOPHORECTOMY     total   TOTAL SHOULDER ARTHROPLASTY Right 07/24/2019   Procedure: RIGHT TOTAL SHOULDER ARTHROPLASTY;  Surgeon: Lyndle Herrlich, MD;  Location: [facility] ORS;  Service: Orthopedics;  Laterality: Right;   Social History:  reports that she has been smoking cigarettes. She has a 96 pack-year smoking history. She has never used smokeless tobacco. She reports that she does not drink alcohol and does not use drugs.  Allergies  Allergen Reactions   Doxycycline Hyclate Anaphylaxis, Swelling and Rash   Acetaminophen Swelling    Tongue swelling and breaks out in welts   Aspirin Swelling    Tongue swelling and breaks out in welts   Latex Rash    Gloves, underwear elastic, tape   Vancomycin Rash    All mycins    Family History  Problem Relation Age of Onset   Hyperlipidemia Mother    Hyperlipidemia Father    Melanoma Sister    Breast cancer Neg Hx     Prior to Admission medications   Medication Sig Start Date End Date Taking? Authorizing Provider  Accu-Chek Softclix Lancets lancets USE TO CHECK BLOOD SUGAR DAILY 05/12/21   Cannady, Corrie Dandy  T, NP  albuterol (PROVENTIL) (2.5 MG/3ML) 0.083% nebulizer solution Take 3 mLs (2.5 mg total) by nebulization every 6 (six) hours as needed for wheezing or shortness of breath. 11/01/22   Cannady, Corrie Dandy T, NP  albuterol (VENTOLIN HFA) 108 (90 Base) MCG/ACT inhaler INHALE 2 PUFFS BY MOUTH INTO THE LUNGS EVERY 6 HOURS AS NEEDED FOR WHEEZING OR SHORTNESS OF BREATH 04/15/22   Cannady, Corrie Dandy T, NP  Blood Glucose Monitoring Suppl (ACCU-CHEK GUIDE ME) w/Device KIT Use to check blood sugars 2-3 times daily with goals = <130 fasting in morning and <180 two hours after eating.  Bring blood sugar log to appointments. 05/11/21   Cannady, Corrie Dandy T, NP  Budeson-Glycopyrrol-Formoterol (BREZTRI AEROSPHERE) 160-9-4.8 MCG/ACT AERO Inhale 2 puffs into the lungs in the morning and at bedtime. 08/02/22   Aura Dials T, NP  Cholecalciferol 25 MCG (1000 UT) capsule Take 2,000 Units by mouth daily.    [provider]  cyanocobalamin (VITAMIN B12) 1000 MCG tablet Take 1 tablet (1,000 mcg  total) by mouth daily. 05/04/22   Cannady, Corrie Dandy T, NP  cyclobenzaprine (FLEXERIL) 10 MG tablet Take 10 mg by mouth 3 (three) times daily as needed for muscle spasms.    [provider]  DULoxetine (CYMBALTA) 60 MG capsule Take 1 capsule (60 mg total) by mouth daily. 09/01/22   Cannady, Corrie Dandy T, NP  furosemide (LASIX) 20 MG tablet TAKE 1 TABLET EVERY DAY AS NEEDED 12/29/21   Debbe Odea, MD  glucose blood (ACCU-CHEK GUIDE) test strip Use as instructed to check blood sugar daily 11/01/22   Cannady, Corrie Dandy T, NP  hydrOXYzine (VISTARIL) 25 MG capsule Take 1 capsule (25 mg total) by mouth every 8 (eight) hours as needed. 11/01/22   Cannady, Corrie Dandy T, NP  lidocaine (LIDODERM) 5 % Place 1 patch onto the skin daily. Remove & Discard patch within 12 hours or as directed by MD 05/04/22   Aura Dials T, NP  olmesartan (BENICAR) 5 MG tablet TAKE 1 TABLET BY MOUTH DAILY 10/25/22   Cannady, Corrie Dandy T, NP  OXYGEN Inhale 2 L into the  lungs at bedtime.    [provider]  pantoprazole (PROTONIX) 40 MG tablet TAKE 1 TABLET BY MOUTH DAILY 12/08/22   Cannady, Jolene T, NP  pregabalin (LYRICA) 25 MG capsule TAKE 1 CAPSULE BY MOUTH TWICE DAILY 02/09/23   Cannady, Corrie Dandy T, NP  prochlorperazine (COMPAZINE) 10 MG tablet Take 10 mg by mouth every 6 (six) hours as needed for vomiting or nausea. 05/10/21   [provider]  rosuvastatin (CRESTOR) 40 MG tablet Take 1 tablet (40 mg total) by mouth daily. 05/04/22   Cannady, Corrie Dandy T, NP  sitaGLIPtin (JANUVIA) 100 MG tablet TAKE 1 TABLET BY MOUTH DAILY 10/25/22   Aura Dials T, NP  traZODone (DESYREL) 100 MG tablet TAKE 1 TABLET BY MOUTH AT BEDTIME AS NEEDED FOR SLEEP 01/18/23   Cannady, Corrie Dandy T, NP  verapamil (CALAN) 80 MG tablet TAKE 1 TABLET BY MOUTH DAILY 01/05/23   Aura Dials T, NP    Physical Exam: Vitals:   03/01/23 1411 03/01/23 1504 03/01/23 1523 03/01/23 1625  BP: 131/84     Pulse: 92     Resp: 18     Temp:    98.4 F (36.9 C)  TempSrc:    Oral  SpO2: 92% (!) 89% 90%   Weight:      Height:       Physical Exam Constitutional:      Appearance: She is obese.  HENT:     Head: Normocephalic and atraumatic.     Nose: Nose normal.     Mouth/Throat:     Mouth: Mucous membranes are moist.  Eyes:     Pupils: Pupils are equal, round, and reactive to light.  Cardiovascular:     Rate and Rhythm: Normal rate and regular rhythm.  Pulmonary:     Effort: Pulmonary effort is normal.     Breath sounds: Wheezing present.  Abdominal:     General: Bowel sounds are normal.  Musculoskeletal:        General: Normal range of motion.     Comments: + generalized weakness    Skin:    General: Skin is warm.  Neurological:     General: No focal deficit present.  Psychiatric:        Mood and Affect: Mood normal.     Data Reviewed:  There are no new results to review at this time.  CT Angio Chest PE W and/or Wo Contrast  CLINICAL DATA:  Pulmonary embolism  (PE) suspected, high prob. Chest pain. Shortness of breath.  EXAM: CT ANGIOGRAPHY CHEST WITH CONTRAST  TECHNIQUE: Multidetector CT imaging of the chest was performed using the standard protocol during bolus administration of intravenous contrast. Multiplanar CT image reconstructions and MIPs were obtained to evaluate the vascular anatomy.  RADIATION DOSE REDUCTION: This exam was performed according to the departmental dose-optimization program which includes automated exposure control, adjustment of the mA and/or kV according to patient size and/or use of iterative reconstruction technique.  CONTRAST:  75mL OMNIPAQUE IOHEXOL 350 MG/ML SOLN  COMPARISON:  CT scan chest from 01/21/2022.  FINDINGS: Cardiovascular: No evidence of embolism to the proximal subsegmental pulmonary artery level.  Normal cardiac size. No pericardial effusion. No aortic aneurysm.  Mediastinum/Nodes: Visualized thyroid gland appears grossly unremarkable. No solid / cystic mediastinal masses. The esophagus is nondistended precluding optimal assessment. There are few mildly prominent mediastinal and hilar lymph nodes, which do not meet the size criteria for lymphadenopathy and appear grossly similar to the prior study, favoring benign etiology. No axillary lymphadenopathy by size criteria.  Lungs/Pleura: The central tracheo-bronchial tree is patent. Mild upper lobe predominant centrilobular and paraseptal emphysematous changes noted, right more than left. There also dependent changes throughout bilateral lungs. There is small right pleural effusion with associated compressive atelectatic changes in the right lung lower lobe. No left pleural effusion. No mass, consolidation or pneumothorax. No suspicious lung nodules.  Upper Abdomen: Visualized upper abdominal viscera within normal limits.  Musculoskeletal: The visualized soft tissues of the chest wall are grossly unremarkable. No suspicious osseous  lesions. There are mild multilevel degenerative changes in the visualized spine.  Review of the MIP images confirms the above findings.  IMPRESSION: 1. No embolism to the proximal subsegmental pulmonary artery level. 2. There is small right pleural effusion with associated compressive atelectatic changes in the right lung lower lobe. No left pleural effusion. No lung mass, consolidation or pneumothorax. No suspicious lung nodules. 3. Multiple other nonacute observations, as described above.  Emphysema (ICD10-J43.9).  Electronically Signed   By: Jules Schick M.D.   On: 03/01/2023 14:58 CT Cervical Spine Wo Contrast CLINICAL DATA:  Headache, new onset (Age >= 51y); Neck pain, acute, no red flags  EXAM: CT HEAD WITHOUT CONTRAST  CT CERVICAL SPINE WITHOUT CONTRAST  TECHNIQUE: Multidetector CT imaging of the head and cervical spine was performed following the standard protocol without intravenous contrast. Multiplanar CT image reconstructions of the cervical spine were also generated.  RADIATION DOSE REDUCTION: This exam was performed according to the departmental dose-optimization program which includes automated exposure control, adjustment of the mA and/or kV according to patient size and/or use of iterative reconstruction technique.  COMPARISON:  None Available.  FINDINGS: CT HEAD FINDINGS  Brain: Hemorrhage. No hydrocephalus. No extra-axial fluid collection. No CT evidence of an acute cortical infarct. No mastoid. No mass lesion.  Vascular: No hyperdense vessel or unexpected calcification.  Skull: Normal. Negative for fracture or focal lesion.  Sinuses/Orbits: No middle ear or mastoid effusion. Paranasal sinuses are notable for an air-fluid level in the right sphenoid sinus. Bilateral lens replacement. Orbits are otherwise unremarkable.  Other: None.  CT CERVICAL SPINE FINDINGS  Alignment: Grade 1 anterolisthesis of C3 on C4 and C4 on C5.  Trace retrolisthesis of C5 on C6.  Skull base and vertebrae: No acute fracture. No primary bone lesion or focal pathologic process.  Soft tissues and spinal canal: No prevertebral fluid or swelling. No visible canal hematoma.  Disc levels:  No CT evidence high-grade spinal canal stenosis.  Upper chest: Biapical paraseptal emphysema.  Other: Nonspecific asymmetrically enlarged lymph node in the right infra parotid region measuring up to 1.1 cm (series 4, image 56). This may be reactive, but clinical follow to resolution is recommended.  IMPRESSION: 1. No acute intracranial abnormality. 2. No acute fracture or traumatic malalignment of the cervical spine. 3. Nonspecific asymmetrically enlarged lymph node in the right infra parotid region measuring up to 1.1 cm. This may be reactive, but clinical follow to resolution is recommended.  Emphysema (ICD10-J43.9).  Electronically Signed   By: Lorenza Cambridge M.D.   On: 03/01/2023 13:29 CT HEAD WO CONTRAST ( ) CLINICAL DATA:  Headache, new onset (Age >= 51y); Neck pain, acute, no red flags  EXAM: CT HEAD WITHOUT CONTRAST  CT CERVICAL SPINE WITHOUT CONTRAST  TECHNIQUE: Multidetector CT imaging of the head and cervical spine was performed following the standard protocol without intravenous contrast. Multiplanar CT image reconstructions of the cervical spine were also generated.  RADIATION DOSE REDUCTION: This exam was performed according to the departmental dose-optimization program which includes automated exposure control, adjustment of the mA and/or kV according to patient size and/or use of iterative reconstruction technique.  COMPARISON:  None Available.  FINDINGS: CT HEAD FINDINGS  Brain: Hemorrhage. No hydrocephalus. No extra-axial fluid collection. No CT evidence of an acute cortical infarct. No mastoid. No mass lesion.  Vascular: No hyperdense vessel or unexpected calcification.  Skull: Normal. Negative for  fracture or focal lesion.  Sinuses/Orbits: No middle ear or mastoid effusion. Paranasal sinuses are notable for an air-fluid level in the right sphenoid sinus. Bilateral lens replacement. Orbits are otherwise unremarkable.  Other: None.  CT CERVICAL SPINE FINDINGS  Alignment: Grade 1 anterolisthesis of C3 on C4 and C4 on C5. Trace retrolisthesis of C5 on C6.  Skull base and vertebrae: No acute fracture. No primary bone lesion or focal pathologic process.  Soft tissues and spinal canal: No prevertebral fluid or swelling. No visible canal hematoma.  Disc levels:  No CT evidence high-grade spinal canal stenosis.  Upper chest: Biapical paraseptal emphysema.  Other: Nonspecific asymmetrically enlarged lymph node in the right infra parotid region measuring up to 1.1 cm (series 4, image 56). This may be reactive, but clinical follow to resolution is recommended.  IMPRESSION: 1. No acute intracranial abnormality. 2. No acute fracture or traumatic malalignment of the cervical spine. 3. Nonspecific asymmetrically enlarged lymph node in the right infra parotid region measuring up to 1.1 cm. This may be reactive, but clinical follow to resolution is recommended.  Emphysema (ICD10-J43.9).  Electronically Signed   By: Lorenza Cambridge M.D.   On: 03/01/2023 13:29 DG Chest Port 1 View CLINICAL DATA:  Shortness of breath.  EXAM: PORTABLE CHEST 1 VIEW  COMPARISON:  July 25, 2020.  FINDINGS: Stable cardiomediastinal silhouette. Status post right shoulder arthroplasty. Left lung is clear. Minimal right basilar subsegmental atelectasis is noted with small right pleural effusion.  IMPRESSION: Minimal right basilar subsegmental atelectasis with small right pleural effusion.  Electronically Signed   By: Lupita Raider M.D.   On: 03/01/2023 12:48  Lab Results  Component Value Date   WBC 8.8 03/01/2023   HGB 13.8 03/01/2023   HCT 43.7 03/01/2023   MCV 92.2 03/01/2023   PLT  288 03/01/2023   Last metabolic panel Lab Results  Component Value Date   GLUCOSE 149 (H) 03/01/2023   NA 137 03/01/2023   K 3.7 03/01/2023  CL 99 03/01/2023   CO2 30 03/01/2023   BUN 9 03/01/2023   CREATININE 0.87 03/01/2023   GFRNONAA >60 03/01/2023   CALCIUM 8.3 (L) 03/01/2023   PROT 6.5 03/01/2023   ALBUMIN 3.5 03/01/2023   LABGLOB 1.9 02/01/2023   AGRATIO 2.3 (H) 08/02/2022   BILITOT 0.8 03/01/2023   ALKPHOS 93 03/01/2023   AST 15 03/01/2023   ALT 8 03/01/2023   ANIONGAP 8 03/01/2023    Assessment and Plan: * Syncope Positive recurrent syncope x 7 days Noted concurrent acute on chronic respiratory failure with hypoxia and active COPD exacerbation Suspect predominant respiratory component given overall presentation Check orthostatics Gentle IV fluid hydration QTc noted in 590s Will repeat to correlate  Hold offending medications  2D echo Monitor     Acute on chronic respiratory failure with hypoxia (HCC) Decompensated respiratory failure now requiring 2 to 3 L nasal cannula in the setting of chronic respiratory failure on home O2 2 L at night and COPD Positive cough, wheezing, increased sputum production over several days CT of the chest grossly stable apart from small pleural effusion and atelectatic changes IV Solu-Medrol IV Rocephin DuoNebs Continue supplemental oxygen Monitor  Prolonged QT interval QTc in 590s in setting of decompensated hypoxic resp failure, syncopal event  K, Mg WNL  Will repeat EKG to correlate  Hold offending medications  Telemetry monitoring Consider cardiology consult if prolongation persists Follow     CAD (coronary artery disease) Baseline CAD  Trop 20-->18 in setting of decompensated resp failure  No active CP  Monitor  Cont home regimen    Chronic diastolic heart failure (HCC) 2D echo July 2023 with a EF of 50 to 55% and grade 2 diastolic dysfunction Appears fairly euvolemic to mildly dry on  presentation Gentle hydration in the setting of syncope Monitor volume status closely Follow  Type 2 diabetes, controlled, with peripheral circulatory disorder (HCC) Blood sugar 140s SSI A1c Monitor blood sugar with steroid use  GERD (gastroesophageal reflux disease) PPI  Hyperlipidemia associated with type 2 diabetes mellitus (HCC) Statin  Nicotine dependence, cigarettes, w unsp disorders 1-2+ pack per day smoker Discussed cessation at length Nicotine patch Monitor      Advance Care Planning:   Code Status: Limited: Do not attempt resuscitation (DNR) -DNR-LIMITED -Do Not Intubate/DNI    Consults: None   Family Communication: Family at the bedside   Severity of Illness: The appropriate patient status for this patient is INPATIENT. Inpatient status is judged to be reasonable and necessary in order to provide the required intensity of service to ensure the patient's safety. The patient's presenting symptoms, physical exam findings, and initial radiographic and laboratory data in the context of their chronic comorbidities is felt to place them at high risk for further clinical deterioration. Furthermore, it is not anticipated that the patient will be medically stable for discharge from the hospital within 2 midnights of admission.   * I certify that at the point of admission it is my clinical judgment that the patient will require inpatient hospital care spanning beyond 2 midnights from the point of admission due to high intensity of service, high risk for further deterioration and high frequency of surveillance required.*  Author: Floydene Flock, MD 03/01/2023 4:25 PM  For on call review [email or web address].

## 2023-03-01 NOTE — Progress Notes (Signed)
BP 95/63   Pulse 90   Temp 98 F (36.7 C) (Oral)   Wt 208 lb 3.2 oz (94.4 kg)   SpO2 90%   BMI 32.13 kg/m    Subjective:    Patient ID: Stacey Gross, female    DOB: 10/30/1960, 62 y.o.   MRN: [id number]  HPI: Stacey Gross is a 62 y.o. female  Chief Complaint  Patient presents with   Weight Check   Edema   O2 sats in office today prior to O2 were in 70 range. Was placed on O2 and sats to >90%.  WEIGHT CHECK AND EDEMA Follow-up today for weight.  Lost 13 pounds since August, does not feel hungry.  Has gained 5 pounds back since last visit but reports some edema present -- this is worsening.  Putting things up to mouth makes her gag.  Occasional abdominal pain, but she states this is not consistent.  Denies any hemoptysis, night sweats, fever.  Yesterday she noticed when she pooped it looked like jelly.  Stacey Gross reports patient coughs all the time now. Has had 5 falls over past 3 weeks and does not recall falls.  No oxygen use at home.  Refuses to return to pulmonary.  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  Discussed with patient and she is willing to go to ER today for further assessment.  She is aware if cancer present and she does not pursue assessment for this it could advance and be incurable.  She states that "is okay" and she "has things set-up".  Stacey Gross is her HCPOA.  Reports she is ready for her time and would not pursue treatment.  Recurrent headaches: no Visual changes: no Palpitations: no Dyspnea: yes Chest pain: no Lower extremity edema: yes Dizzy/lightheaded: yes   Relevant past medical, surgical, family and social history reviewed and updated as indicated. Interim medical history since our last visit reviewed. Allergies and medications reviewed and updated.  Review of Systems  Constitutional:  Positive for activity change, appetite change and fatigue. Negative for chills and fever.   Respiratory:  Positive for cough, shortness of breath and wheezing. Negative for chest tightness.   Cardiovascular:  Positive for leg swelling. Negative for chest pain and palpitations.  Gastrointestinal: Negative.   Endocrine: Negative for cold intolerance, heat intolerance, polydipsia, polyphagia and polyuria.  Neurological: Negative.   Psychiatric/Behavioral: Negative.      Per HPI unless specifically indicated above     Objective:    BP 95/63   Pulse 90   Temp 98 F (36.7 C) (Oral)   Wt 208 lb 3.2 oz (94.4 kg)   SpO2 90%   BMI 32.13 kg/m   Wt Readings from Last 3 Encounters:  03/01/23 208 lb (94.3 kg)  03/01/23 208 lb 3.2 oz (94.4 kg)  02/01/23 202 lb 9.6 oz (91.9 kg)    Physical Exam Vitals and nursing note reviewed.  Constitutional:      General: She is awake.     Appearance: She is well-developed and well-groomed. She is obese.     Comments: Coloring slightly paler today.  HENT:     Head: Normocephalic.     Right Ear: Hearing, tympanic membrane, ear canal and external ear normal.     Left Ear: Hearing, tympanic membrane, ear canal and external ear normal.     Nose:     Right Sinus: No maxillary sinus tenderness or frontal sinus  tenderness.     Left Sinus: No maxillary sinus tenderness or frontal sinus tenderness.  Eyes:     General: Lids are normal.        Right eye: No discharge.        Left eye: No discharge.     Conjunctiva/sclera: Conjunctivae normal.     Pupils: Pupils are equal, round, and reactive to light.  Neck:     Thyroid: No thyromegaly.     Vascular: No carotid bruit.  Cardiovascular:     Rate and Rhythm: Normal rate and regular rhythm.     Heart sounds: Normal heart sounds. No murmur heard.    No gallop.  Pulmonary:     Effort: Pulmonary effort is normal. No accessory muscle usage or respiratory distress.     Breath sounds: Wheezing present.     Comments: Scattered expiratory wheezes throughout noted - baseline. Abdominal:     General:  Bowel sounds are normal.     Palpations: Abdomen is soft.  Musculoskeletal:     Cervical back: Normal range of motion and neck supple.     Right lower leg: 1+ Pitting Edema present.     Left lower leg: 1+ Pitting Edema present.  Skin:    General: Skin is warm and dry.     Comments: Scattered pale purple bruising bilateral upper extremities.  Neurological:     Mental Status: She is alert and oriented to person, place, and time.  Psychiatric:        Attention and Perception: Attention normal.        Mood and Affect: Mood normal.        Speech: Speech normal.        Behavior: Behavior normal. Behavior is cooperative.        Thought Content: Thought content normal.     Results for orders placed or performed in visit on 02/02/23  HM DIABETES EYE EXAM  Result Value Ref Range   HM Diabetic Eye Exam No Retinopathy No Retinopathy      Assessment & Plan:   Problem List Items Addressed This Visit       Cardiovascular and Mediastinum   Chronic diastolic heart failure [facility])    Per cardiology notes on review.  She was to follow-up with them in 3 months, recommend she do this.  At this time recommend going to ER due to increased edema and overall decline in health. Continue current medication regimen and recommend: - Reminded to call for an overnight weight gain of >2 pounds or a weekly weight gain of >5 pounds - not adding salt to food and read food labels. Reviewed the importance of keeping daily sodium intake to 2000mg  daily. - Avoid NSAIDs        Respiratory   Centrilobular emphysema (HCC) - Primary    Chronic, ongoing.  O2 sats initially in 70-80 range in office today, improved to 90's with 2L O2.  Continue Breztri daily and Albuterol as needed.  Recommend complete cessation of smoking.  Continue annual CT scans.  Continue collaboration with pulmonary, which is beneficial, she refuses to return.  Have advised her to go to ER today for further assessment, which she is agreeable to.   No O2 with her today, sat without O2 on leaving was 90%.        Other   Weight loss, abnormal    Continues to lose weight unintentionally, reduced appetite.  Has lost 13 pounds since August, but gained 6 lbs back this  visit which suspect is related to edema.  History of Uterine and Colon cancer.  Presented like this when diagnosed she reports.  She is agreeable to going to ER today for further work-up, PCP discussed a length concerns with her.  Reports she would not get treatment if present.  Stacey Gross is present in room with her and is HCPOA, aware of her wishes.  She has a DNR at home. She is able to make her own decisions and very oriented.        Follow up plan: Return in about 1 week (around 03/08/2023) for COPD.

## 2023-03-01 NOTE — Assessment & Plan Note (Signed)
1-2+ pack per day smoker Discussed cessation at length Nicotine patch Monitor

## 2023-03-01 NOTE — Assessment & Plan Note (Signed)
Decompensated respiratory failure now requiring 2 to 3 L nasal cannula in the setting of chronic respiratory failure on home O2 2 L at night and COPD Positive cough, wheezing, increased sputum production over several days CT of the chest grossly stable apart from small pleural effusion and atelectatic changes IV Solu-Medrol IV Rocephin DuoNebs Continue supplemental oxygen Monitor

## 2023-03-01 NOTE — ED Triage Notes (Signed)
Pt arrives via POV. PT had a routine visit with her PCP today. She was sent to the ED due to a reported Spo2 in the 70s. Currently 86% during triage. Pt placed on 2LNC, now 92%. Pt reports intermittent chest pain that started last night and increased sob. Pt AxOx4.

## 2023-03-01 NOTE — Assessment & Plan Note (Signed)
Statin

## 2023-03-01 NOTE — Assessment & Plan Note (Signed)
Per cardiology notes on review.  She was to follow-up with them in 3 months, recommend she do this.  At this time recommend going to ER due to increased edema and overall decline in health. Continue current medication regimen and recommend: - Reminded to call for an overnight weight gain of >2 pounds or a weekly weight gain of >5 pounds - not adding salt to food and read food labels. Reviewed the importance of keeping daily sodium intake to 2000mg  daily. - Avoid NSAIDs

## 2023-03-01 NOTE — Assessment & Plan Note (Signed)
Blood sugar 140s SSI A1c Monitor blood sugar with steroid use

## 2023-03-01 NOTE — Assessment & Plan Note (Addendum)
QTc in 590s in setting of decompensated hypoxic resp failure, syncopal event  K, Mg WNL  Will repeat EKG to correlate  Hold offending medications  Telemetry monitoring Consider cardiology consult if prolongation persists Follow

## 2023-03-01 NOTE — Assessment & Plan Note (Addendum)
Positive recurrent syncope x 7 days Noted concurrent acute on chronic respiratory failure with hypoxia and active COPD exacerbation Suspect predominant respiratory component given overall presentation Check orthostatics Gentle IV fluid hydration QTc noted in 590s Will repeat to correlate  Hold offending medications  2D echo Monitor

## 2023-03-01 NOTE — Assessment & Plan Note (Signed)
Chronic, ongoing.  O2 sats initially in 70-80 range in office today, improved to 90's with 2L O2.  Continue Breztri daily and Albuterol as needed.  Recommend complete cessation of smoking.  Continue annual CT scans.  Continue collaboration with pulmonary, which is beneficial, she refuses to return.  Have advised her to go to ER today for further assessment, which she is agreeable to.  No O2 with her today, sat without O2 on leaving was 90%.

## 2023-03-01 NOTE — ED Notes (Signed)
Multiple issues with x2 EKG machines with printing off a picture.

## 2023-03-01 NOTE — ED Notes (Signed)
Pulse ox obtained at this time during ambulation. Pt sating between 86%-89% on 3 L [place]. Pt did not actively get SOB while walking. Denies any dizziness or feeling like she is going to pass out.

## 2023-03-01 NOTE — Assessment & Plan Note (Signed)
PPI ?

## 2023-03-01 NOTE — Assessment & Plan Note (Signed)
Baseline CAD  Trop 20-->18 in setting of decompensated resp failure  No active CP  Monitor  Cont home regimen

## 2023-03-02 DIAGNOSIS — R55 Syncope and collapse: Secondary | ICD-10-CM | POA: Diagnosis not present

## 2023-03-02 MED ORDER — AMOXICILLIN-POT CLAVULANATE 875-125 MG PO TABS: 1.0000 | ORAL_TABLET | Freq: Two times a day (BID) | ORAL | 0 refills | Status: AC

## 2023-03-02 MED ORDER — AMOXICILLIN-POT CLAVULANATE 875-125 MG PO TABS: 1.0000 | ORAL_TABLET | Freq: Two times a day (BID) | ORAL | Status: DC

## 2023-03-02 MED ORDER — PREDNISONE 20 MG PO TABS: 40.0000 mg | ORAL_TABLET | Freq: Every day | ORAL | 0 refills | Status: AC

## 2023-03-02 NOTE — Discharge Summary (Signed)
Physician Discharge Summary  ADREAN COMEGYS ZOX:[id number] DOB: [birth year] DOA: 03/01/2023  PCP: Marjie Skiff, NP  Admit date: 03/01/2023 Discharge date: 03/02/2023  Admitted From: Home Disposition: Home  Recommendations for Outpatient Follow-up:  Follow up with PCP in 1-2 weeks Outpatient follow-up with cardiology  Home Health: No Equipment/Devices: 3 L via nasal cannula  Discharge Condition: Stable CODE STATUS: DNR Diet recommendation: Heart healthy/carb modified  Brief/Interim Summary:  62 y.o. female with medical history significant of COPD, 2 L home O2 at night, HFpEF, hyperlipidemia, hypertension, peripheral vascular disease, tobacco abuse presenting with acute on chronic respiratory failure with hypoxia, syncope, COPD exacerbation.  History from patient as well as daughter at the bedside.  Per report, patient with worsening shortness of breath, cough wheezing over the course of the past several days.  Has not been wearing nighttime oxygen per report.  Still smoking up to 1 to 2 packs/day.  Has also had 2 episodes of syncope during the same timeframe.  Episodes usually happen when patient has prolonged standing with generalized shaking.  Patient does not remember what happens associated with episode.  Denies any known prodrome including weakness, dizziness, chest pain or shortness of breath.  No reported recent change in medication.  Positive decreased appetite over the past week or so.?  Diarrhea.  No focal hemiparesis or confusion.  No reported slurred speech.  No reported direct head trauma.  Was seen by PCP today and redirected to the ER because of persistent hypoxia with O2 sats in the 70s to 80s.   Treated with working diagnosis of decompensated COPD.  Oxygen status did improve during course of admission.  Requiring 3 L nasal cannula at time of discharge.  Will need updated prescription for oxygen with new baseline requirement of 3 L.  Regarding his syncopal events the  etiology is somewhat unclear.  Patient denies any clear prodrome to her events.  No recent change in position.  No recent change in diet or fluid intake.  This raises suspicion for arrhythmogenic syncope.  I have discussed with cardiology service who will have device company mailed a live event monitor patient and they will set up outpatient follow-up with [facility] cardiology.  Patient was last seen by [facility] in 2023 and subsequently lost to follow-up.    Discharge Diagnoses:  Principal Problem:   Syncope Active Problems:   Acute on chronic respiratory failure with hypoxia (HCC)   Nicotine dependence, cigarettes, w unsp disorders   Hyperlipidemia associated with type 2 diabetes mellitus (HCC)   GERD (gastroesophageal reflux disease)   Type 2 diabetes, controlled, with peripheral circulatory disorder (HCC)   Chronic diastolic heart failure (HCC)   CAD (coronary artery disease)   Prolonged QT interval  Syncope Etiology is somewhat unclear.  Arrhythmia genic syncope is on differential.  Also possibly orthostatic hypotension contributing.  Patient did well with physical therapy.  No obvious orthostasis noted on vital sign check.  Case discussed with cardiology.  Due to holiday there is no ability to place an event monitor prior to discharge but [facility] cardiology will arrange for event monitor to be mailed to patient house and they will set up outpatient follow-up.  Patient advised to ambulate with a walker and take great care when changing positions.  Expressed understanding.   Acute on chronic respiratory failure with hypoxia (HCC) Acute decompensated COPD Patient presented with decompensated respiratory failure from her primary care office.  Apparently her oxygen saturation was in the 70s on her baseline 2 L.  She received intravenous steroids and aggressive bronchodilator therapy.  Oxygen status and respiratory issues have improved however patient still requiring 3 L nasal cannula at time of discharge.   No significant desaturation upon ambulation.  I have involved TOC and [employer].  New oxygen tank will be delivered to patient bedside prior to discharge.  New DME order was provided.  Will prescribe 4 additional days of p.o. prednisone 40 mg daily for treatment of decompensated COPD.  Patient advised to discontinue tobacco use.  Currently a 1-2 pack-a-day smoker.  Discharge Instructions  Discharge Instructions     Diet - low sodium heart healthy   Complete by: As directed    Increase activity slowly   Complete by: As directed       Allergies as of 03/02/2023       Reactions   Doxycycline Hyclate Anaphylaxis, Swelling, Rash   Acetaminophen Swelling   Tongue swelling and breaks out in welts   Aspirin Swelling   Tongue swelling and breaks out in welts   Latex Rash   Gloves, underwear elastic, tape   Vancomycin Rash   All mycins        Medication List     TAKE these medications    Accu-Chek Guide Me w/Device Kit Use to check blood sugars 2-3 times daily with goals = <130 fasting in morning and <180 two hours after eating.  Bring blood sugar log to appointments.   Accu-Chek Guide test strip Generic drug: glucose blood Use as instructed to check blood sugar daily   Accu-Chek Softclix Lancets lancets USE TO CHECK BLOOD SUGAR DAILY   albuterol 108 (90 Base) MCG/ACT inhaler Commonly known as: VENTOLIN HFA INHALE 2 PUFFS BY MOUTH INTO THE LUNGS EVERY 6 HOURS AS NEEDED FOR WHEEZING OR SHORTNESS OF BREATH   albuterol (2.5 MG/3ML) 0.083% nebulizer solution Commonly known as: PROVENTIL Take 3 mLs (2.5 mg total) by nebulization every 6 (six) hours as needed for wheezing or shortness of breath.   amoxicillin-clavulanate 875-125 MG tablet Commonly known as: AUGMENTIN Take 1 tablet by mouth every 12 (twelve) hours for 6 doses. Start taking on: March 03, 2023   Breztri Aerosphere 160-9-4.8 MCG/ACT Aero Generic drug: Budeson-Glycopyrrol-Formoterol Inhale 2 puffs into the  lungs in the morning and at bedtime.   Cholecalciferol 25 MCG (1000 UT) capsule Take 2,000 Units by mouth daily.   cyanocobalamin 1000 MCG tablet Commonly known as: VITAMIN B12 Take 1 tablet (1,000 mcg total) by mouth daily.   cyclobenzaprine 10 MG tablet Commonly known as: FLEXERIL Take 10 mg by mouth 3 (three) times daily as needed for muscle spasms.   DULoxetine 60 MG capsule Commonly known as: Cymbalta Take 1 capsule (60 mg total) by mouth daily.   furosemide 20 MG tablet Commonly known as: LASIX TAKE 1 TABLET EVERY DAY AS NEEDED   hydrOXYzine 25 MG capsule Commonly known as: VISTARIL Take 1 capsule (25 mg total) by mouth every 8 (eight) hours as needed.   Januvia 100 MG tablet Generic drug: sitaGLIPtin TAKE 1 TABLET BY MOUTH DAILY   lidocaine 5 % Commonly known as: Lidoderm Place 1 patch onto the skin daily. Remove & Discard patch within 12 hours or as directed by MD   olmesartan 5 MG tablet Commonly known as: BENICAR TAKE 1 TABLET BY MOUTH DAILY   OXYGEN Inhale 2 L into the lungs at bedtime.   pantoprazole 40 MG tablet Commonly known as: PROTONIX TAKE 1 TABLET BY MOUTH DAILY   predniSONE 20 MG tablet  Commonly known as: DELTASONE Take 2 tablets (40 mg total) by mouth daily with breakfast for 4 days.   pregabalin 25 MG capsule Commonly known as: LYRICA TAKE 1 CAPSULE BY MOUTH TWICE DAILY   prochlorperazine 10 MG tablet Commonly known as: COMPAZINE Take 10 mg by mouth every 6 (six) hours as needed for vomiting or nausea.   rosuvastatin 40 MG tablet Commonly known as: CRESTOR Take 1 tablet (40 mg total) by mouth daily.   traZODone 100 MG tablet Commonly known as: DESYREL TAKE 1 TABLET BY MOUTH AT BEDTIME AS NEEDED FOR SLEEP   verapamil 80 MG tablet Commonly known as: CALAN TAKE 1 TABLET BY MOUTH DAILY               Durable Medical Equipment  (From admission, onward)           Start     Ordered   03/02/23 1043  For home use only DME  oxygen  Once       Question Answer Comment  Length of Need Lifetime   Mode or (Route) Nasal cannula   Liters per Minute 3   Frequency Continuous (stationary and portable oxygen unit needed)   Oxygen conserving device Yes   Oxygen delivery system Gas      03/02/23 1042            Allergies  Allergen Reactions   Doxycycline Hyclate Anaphylaxis, Swelling and Rash   Acetaminophen Swelling    Tongue swelling and breaks out in welts   Aspirin Swelling    Tongue swelling and breaks out in welts   Latex Rash    Gloves, underwear elastic, tape   Vancomycin Rash    All mycins    Consultations: None   Procedures/Studies: CT Angio Chest PE W and/or Wo Contrast  Result Date: 03/01/2023 CLINICAL DATA:  Pulmonary embolism (PE) suspected, high prob. Chest pain. Shortness of breath. EXAM: CT ANGIOGRAPHY CHEST WITH CONTRAST TECHNIQUE: Multidetector CT imaging of the chest was performed using the standard protocol during bolus administration of intravenous contrast. Multiplanar CT image reconstructions and MIPs were obtained to evaluate the vascular anatomy. RADIATION DOSE REDUCTION: This exam was performed according to the departmental dose-optimization program which includes automated exposure control, adjustment of the mA and/or kV according to patient size and/or use of iterative reconstruction technique. CONTRAST:  75mL OMNIPAQUE IOHEXOL 350 MG/ML SOLN COMPARISON:  CT scan chest from 01/21/2022. FINDINGS: Cardiovascular: No evidence of embolism to the proximal subsegmental pulmonary artery level. Normal cardiac size. No pericardial effusion. No aortic aneurysm. Mediastinum/Nodes: Visualized thyroid gland appears grossly unremarkable. No solid / cystic mediastinal masses. The esophagus is nondistended precluding optimal assessment. There are few mildly prominent mediastinal and hilar lymph nodes, which do not meet the size criteria for lymphadenopathy and appear grossly similar to the prior  study, favoring benign etiology. No axillary lymphadenopathy by size criteria. Lungs/Pleura: The central tracheo-bronchial tree is patent. Mild upper lobe predominant centrilobular and paraseptal emphysematous changes noted, right more than left. There also dependent changes throughout bilateral lungs. There is small right pleural effusion with associated compressive atelectatic changes in the right lung lower lobe. No left pleural effusion. No mass, consolidation or pneumothorax. No suspicious lung nodules. Upper Abdomen: Visualized upper abdominal viscera within normal limits. Musculoskeletal: The visualized soft tissues of the chest wall are grossly unremarkable. No suspicious osseous lesions. There are mild multilevel degenerative changes in the visualized spine. Review of the MIP images confirms the above findings. IMPRESSION: 1. No embolism  to the proximal subsegmental pulmonary artery level. 2. There is small right pleural effusion with associated compressive atelectatic changes in the right lung lower lobe. No left pleural effusion. No lung mass, consolidation or pneumothorax. No suspicious lung nodules. 3. Multiple other nonacute observations, as described above. Emphysema (ICD10-J43.9). Electronically Signed   By: Jules Schick M.D.   On: 03/01/2023 14:58   CT HEAD WO CONTRAST ( )  Result Date: 03/01/2023 CLINICAL DATA:  Headache, new onset (Age >= 51y); Neck pain, acute, no red flags EXAM: CT HEAD WITHOUT CONTRAST CT CERVICAL SPINE WITHOUT CONTRAST TECHNIQUE: Multidetector CT imaging of the head and cervical spine was performed following the standard protocol without intravenous contrast. Multiplanar CT image reconstructions of the cervical spine were also generated. RADIATION DOSE REDUCTION: This exam was performed according to the departmental dose-optimization program which includes automated exposure control, adjustment of the mA and/or kV according to patient size and/or use of iterative  reconstruction technique. COMPARISON:  None Available. FINDINGS: CT HEAD FINDINGS Brain: Hemorrhage. No hydrocephalus. No extra-axial fluid collection. No CT evidence of an acute cortical infarct. No mastoid. No mass lesion. Vascular: No hyperdense vessel or unexpected calcification. Skull: Normal. Negative for fracture or focal lesion. Sinuses/Orbits: No middle ear or mastoid effusion. Paranasal sinuses are notable for an air-fluid level in the right sphenoid sinus. Bilateral lens replacement. Orbits are otherwise unremarkable. Other: None. CT CERVICAL SPINE FINDINGS Alignment: Grade 1 anterolisthesis of C3 on C4 and C4 on C5. Trace retrolisthesis of C5 on C6. Skull base and vertebrae: No acute fracture. No primary bone lesion or focal pathologic process. Soft tissues and spinal canal: No prevertebral fluid or swelling. No visible canal hematoma. Disc levels:  No CT evidence high-grade spinal canal stenosis. Upper chest: Biapical paraseptal emphysema. Other: Nonspecific asymmetrically enlarged lymph node in the right infra parotid region measuring up to 1.1 cm (series 4, image 56). This may be reactive, but clinical follow to resolution is recommended. IMPRESSION: 1. No acute intracranial abnormality. 2. No acute fracture or traumatic malalignment of the cervical spine. 3. Nonspecific asymmetrically enlarged lymph node in the right infra parotid region measuring up to 1.1 cm. This may be reactive, but clinical follow to resolution is recommended. Emphysema (ICD10-J43.9). Electronically Signed   By: Lorenza Cambridge M.D.   On: 03/01/2023 13:29   CT Cervical Spine Wo Contrast  Result Date: 03/01/2023 CLINICAL DATA:  Headache, new onset (Age >= 51y); Neck pain, acute, no red flags EXAM: CT HEAD WITHOUT CONTRAST CT CERVICAL SPINE WITHOUT CONTRAST TECHNIQUE: Multidetector CT imaging of the head and cervical spine was performed following the standard protocol without intravenous contrast. Multiplanar CT image  reconstructions of the cervical spine were also generated. RADIATION DOSE REDUCTION: This exam was performed according to the departmental dose-optimization program which includes automated exposure control, adjustment of the mA and/or kV according to patient size and/or use of iterative reconstruction technique. COMPARISON:  None Available. FINDINGS: CT HEAD FINDINGS Brain: Hemorrhage. No hydrocephalus. No extra-axial fluid collection. No CT evidence of an acute cortical infarct. No mastoid. No mass lesion. Vascular: No hyperdense vessel or unexpected calcification. Skull: Normal. Negative for fracture or focal lesion. Sinuses/Orbits: No middle ear or mastoid effusion. Paranasal sinuses are notable for an air-fluid level in the right sphenoid sinus. Bilateral lens replacement. Orbits are otherwise unremarkable. Other: None. CT CERVICAL SPINE FINDINGS Alignment: Grade 1 anterolisthesis of C3 on C4 and C4 on C5. Trace retrolisthesis of C5 on C6. Skull base and vertebrae: No acute fracture. No  primary bone lesion or focal pathologic process. Soft tissues and spinal canal: No prevertebral fluid or swelling. No visible canal hematoma. Disc levels:  No CT evidence high-grade spinal canal stenosis. Upper chest: Biapical paraseptal emphysema. Other: Nonspecific asymmetrically enlarged lymph node in the right infra parotid region measuring up to 1.1 cm (series 4, image 56). This may be reactive, but clinical follow to resolution is recommended. IMPRESSION: 1. No acute intracranial abnormality. 2. No acute fracture or traumatic malalignment of the cervical spine. 3. Nonspecific asymmetrically enlarged lymph node in the right infra parotid region measuring up to 1.1 cm. This may be reactive, but clinical follow to resolution is recommended. Emphysema (ICD10-J43.9). Electronically Signed   By: Lorenza Cambridge M.D.   On: 03/01/2023 13:29   DG Chest Port 1 View  Result Date: 03/01/2023 CLINICAL DATA:  Shortness of breath.  EXAM: PORTABLE CHEST 1 VIEW COMPARISON:  July 25, 2020. FINDINGS: Stable cardiomediastinal silhouette. Status post right shoulder arthroplasty. Left lung is clear. Minimal right basilar subsegmental atelectasis is noted with small right pleural effusion. IMPRESSION: Minimal right basilar subsegmental atelectasis with small right pleural effusion. Electronically Signed   By: Lupita Raider M.D.   On: 03/01/2023 12:48      Subjective: Seen and examined on the day of discharge.  Stable no distress.  Appropriate discharge home.  Discharge Exam: Vitals:   03/02/23 0745 03/02/23 0900  BP:    Pulse: 83   Resp:    Temp:    SpO2: 93% 92%   Vitals:   03/02/23 0731 03/02/23 0744 03/02/23 0745 03/02/23 0900  BP:  (!) 161/88    Pulse: 86 85 83   Resp: 18 18    Temp:  98.1 F (36.7 C)    TempSrc:      SpO2: 91% 90% 93% 92%  Weight:      Height:        General: Pt is alert, awake, not in acute distress Cardiovascular: RRR, S1/S2 +, no rubs, no gallops Respiratory: CTA bilaterally, no wheezing, no rhonchi Abdominal: Soft, NT, ND, bowel sounds + Extremities: no edema, no cyanosis    The results of significant diagnostics from this hospitalization (including imaging, microbiology, ancillary and laboratory) are listed below for reference.     Microbiology: Recent Results (from the past 240 hour(s))  Resp panel by RT-PCR (RSV, Flu A&B, Covid) Anterior Nasal Swab     Status: None   Collection Time: 03/01/23 11:25 AM   Specimen: Anterior Nasal Swab  Result Value Ref Range Status   SARS Coronavirus 2 by RT PCR NEGATIVE NEGATIVE Final    Comment: (NOTE) SARS-CoV-2 target nucleic acids are NOT DETECTED.  The SARS-CoV-2 RNA is generally detectable in upper respiratory specimens during the acute phase of infection. The lowest concentration of SARS-CoV-2 viral copies this assay can detect is 138 copies/mL. A negative result does not preclude SARS-Cov-2 infection and should not be used as  the sole basis for treatment or other patient management decisions. A negative result may occur with  improper specimen collection/handling, submission of specimen other than nasopharyngeal swab, presence of viral mutation(s) within the areas targeted by this assay, and inadequate number of viral copies(<138 copies/mL). A negative result must be combined with clinical observations, patient history, and epidemiological information. The expected result is Negative.  Fact Sheet for Patients:  BloggerCourse.com  Fact Sheet for Healthcare Providers:  SeriousBroker.it  This test is no t yet approved or cleared by the [place] and  has been authorized for detection and/or diagnosis of SARS-CoV-2 by FDA under an Emergency Use Authorization (EUA). This EUA will remain  in effect (meaning this test can be used) for the duration of the COVID-19 declaration under Section 564(b)(1) of the Act, 21 U.S.C.section 360bbb-3(b)(1), unless the authorization is terminated  or revoked sooner.       Influenza A by PCR NEGATIVE NEGATIVE Final   Influenza B by PCR NEGATIVE NEGATIVE Final    Comment: (NOTE) The Xpert Xpress SARS-CoV-2/FLU/RSV plus assay is intended as an aid in the diagnosis of influenza from Nasopharyngeal swab specimens and should not be used as a sole basis for treatment. Nasal washings and aspirates are unacceptable for Xpert Xpress SARS-CoV-2/FLU/RSV testing.  Fact Sheet for Patients: BloggerCourse.com  Fact Sheet for Healthcare Providers: SeriousBroker.it  This test is not yet approved or cleared by the [place] FDA and has been authorized for detection and/or diagnosis of SARS-CoV-2 by FDA under an Emergency Use Authorization (EUA). This EUA will remain in effect (meaning this test can be used) for the duration of the COVID-19 declaration under Section 564(b)(1) of  the Act, 21 U.S.C. section 360bbb-3(b)(1), unless the authorization is terminated or revoked.     Resp Syncytial Virus by PCR NEGATIVE NEGATIVE Final    Comment: (NOTE) Fact Sheet for Patients: BloggerCourse.com  Fact Sheet for Healthcare Providers: SeriousBroker.it  This test is not yet approved or cleared by the [place] FDA and has been authorized for detection and/or diagnosis of SARS-CoV-2 by FDA under an Emergency Use Authorization (EUA). This EUA will remain in effect (meaning this test can be used) for the duration of the COVID-19 declaration under Section 564(b)(1) of the Act, 21 U.S.C. section 360bbb-3(b)(1), unless the authorization is terminated or revoked.  Performed at [facility], [address] [place], [place] [address]      Labs: BNP (last 3 results) Recent Labs    03/01/23 1125  BNP 91.5   Basic Metabolic Panel: Recent Labs  Lab 03/01/23 1125  NA 137  K 3.7  CL 99  CO2 30  GLUCOSE 149*  BUN 9  CREATININE 0.87  CALCIUM 8.3*  MG 1.7   Liver Function Tests: Recent Labs  Lab 03/01/23 1125  AST 15  ALT 8  ALKPHOS 93  BILITOT 0.8  PROT 6.5  ALBUMIN 3.5   No results for input(s): "LIPASE", "AMYLASE" in the last 168 hours. No results for input(s): "AMMONIA" in the last 168 hours. CBC: Recent Labs  Lab 03/01/23 1125  WBC 8.8  HGB 13.8  HCT 43.7  MCV 92.2  PLT 288   Cardiac Enzymes: No results for input(s): "CKTOTAL", "CKMB", "CKMBINDEX", "TROPONINI" in the last 168 hours. BNP: Invalid input(s): "POCBNP" CBG: No results for input(s): "GLUCAP" in the last 168 hours. D-Dimer Recent Labs    03/01/23 1125  DDIMER 2.13*   Hgb A1c No results for input(s): "HGBA1C" in the last 72 hours. Lipid Profile No results for input(s): "CHOL", "HDL", "LDLCALC", "TRIG", "CHOLHDL", "LDLDIRECT" in the last 72 hours. Thyroid function studies No results for input(s): "TSH",  "T4TOTAL", "T3FREE", "THYROIDAB" in the last 72 hours.  Invalid input(s): "FREET3" Anemia work up No results for input(s): "VITAMINB12", "FOLATE", "FERRITIN", "TIBC", "IRON", "RETICCTPCT" in the last 72 hours. Urinalysis    Component Value Date/Time   COLORURINE YELLOW (A) 06/07/2017 1114   APPEARANCEUR Clear 02/23/2022 1025   LABSPEC 1.014 06/07/2017 1114   PHURINE 7.0 06/07/2017 1114   GLUCOSEU Negative 02/23/2022 1025  HGBUR NEGATIVE 06/07/2017 1114   BILIRUBINUR Negative 02/23/2022 1025   KETONESUR NEGATIVE 06/07/2017 1114   PROTEINUR 1+ (A) 02/23/2022 1025   PROTEINUR NEGATIVE 06/07/2017 1114   NITRITE Negative 02/23/2022 1025   NITRITE NEGATIVE 06/07/2017 1114   LEUKOCYTESUR Negative 02/23/2022 1025   Sepsis Labs Recent Labs  Lab 03/01/23 1125  WBC 8.8   Microbiology Recent Results (from the past 240 hour(s))  Resp panel by RT-PCR (RSV, Flu A&B, Covid) Anterior Nasal Swab     Status: None   Collection Time: 03/01/23 11:25 AM   Specimen: Anterior Nasal Swab  Result Value Ref Range Status   SARS Coronavirus 2 by RT PCR NEGATIVE NEGATIVE Final    Comment: (NOTE) SARS-CoV-2 target nucleic acids are NOT DETECTED.  The SARS-CoV-2 RNA is generally detectable in upper respiratory specimens during the acute phase of infection. The lowest concentration of SARS-CoV-2 viral copies this assay can detect is 138 copies/mL. A negative result does not preclude SARS-Cov-2 infection and should not be used as the sole basis for treatment or other patient management decisions. A negative result may occur with  improper specimen collection/handling, submission of specimen other than nasopharyngeal swab, presence of viral mutation(s) within the areas targeted by this assay, and inadequate number of viral copies(<138 copies/mL). A negative result must be combined with clinical observations, patient history, and epidemiological information. The expected result is Negative.  Fact  Sheet for Patients:  BloggerCourse.com  Fact Sheet for Healthcare Providers:  SeriousBroker.it  This test is no t yet approved or cleared by the [place] FDA and  has been authorized for detection and/or diagnosis of SARS-CoV-2 by FDA under an Emergency Use Authorization (EUA). This EUA will remain  in effect (meaning this test can be used) for the duration of the COVID-19 declaration under Section 564(b)(1) of the Act, 21 U.S.C.section 360bbb-3(b)(1), unless the authorization is terminated  or revoked sooner.       Influenza A by PCR NEGATIVE NEGATIVE Final   Influenza B by PCR NEGATIVE NEGATIVE Final    Comment: (NOTE) The Xpert Xpress SARS-CoV-2/FLU/RSV plus assay is intended as an aid in the diagnosis of influenza from Nasopharyngeal swab specimens and should not be used as a sole basis for treatment. Nasal washings and aspirates are unacceptable for Xpert Xpress SARS-CoV-2/FLU/RSV testing.  Fact Sheet for Patients: BloggerCourse.com  Fact Sheet for Healthcare Providers: SeriousBroker.it  This test is not yet approved or cleared by the [place] FDA and has been authorized for detection and/or diagnosis of SARS-CoV-2 by FDA under an Emergency Use Authorization (EUA). This EUA will remain in effect (meaning this test can be used) for the duration of the COVID-19 declaration under Section 564(b)(1) of the Act, 21 U.S.C. section 360bbb-3(b)(1), unless the authorization is terminated or revoked.     Resp Syncytial Virus by PCR NEGATIVE NEGATIVE Final    Comment: (NOTE) Fact Sheet for Patients: BloggerCourse.com  Fact Sheet for Healthcare Providers: SeriousBroker.it  This test is not yet approved or cleared by the [place] FDA and has been authorized for detection and/or diagnosis of SARS-CoV-2 by FDA under an  Emergency Use Authorization (EUA). This EUA will remain in effect (meaning this test can be used) for the duration of the COVID-19 declaration under Section 564(b)(1) of the Act, 21 U.S.C. section 360bbb-3(b)(1), unless the authorization is terminated or revoked.  Performed at [facility], [address], [place], [place] [address]      Time coordinating discharge: Over 30 minutes  SIGNED:   Tresa Moore, MD  [facility] 03/02/2023, 11:57 AM Pager   If 7PM-7AM, please contact night-coverage

## 2023-03-02 NOTE — Evaluation (Signed)
Physical Therapy Evaluation Patient Details Name: Stacey Gross MRN: [id number] DOB: Aug 17, 1960 Today's Date: 03/02/2023  History of Present Illness  Pt is a 62 y.o. female presenting to hospital 03/01/23 with c/o chest pain and SOB; SpO2 sats noted to be in 70's at PCP's.  Pt admitted with syncope, acute on chronic respiratory failure with hypoxia, prolonged QT interval.  PMH includes DM, COPD on 2 L at nighttime, diastolic HF, PVD, R TSA.  Recent back sx 12/2022 (pt reports she does not have to wear brace anymore).  Recurrent episodes of syncope per pt and chart.  Clinical Impression  Prior to recent medical concerns, pt was modified independent with ambulation (held onto furniture as needed); lives alone in 1 level home with ramp to enter; pt's sister lives next door and comes over multiple times a day to assist as needed; wears 2 L home O2 at night (no O2 use during the day).  Mild LBP reported during session.  Currently pt is modified independent with bed mobility; SBA with transfers; and CGA to ambulate 100 feet with RW use (SOB noted with activity requiring sitting rest break post ambulation).  Therapist took orthostatics during session: supine BP 127/76 (HR 92 bpm); sitting BP 147/86 (HR 94 bpm); standing BP at 0 minutes 136/117 (HR 99 bpm); and standing BP at 3 minutes 114/88 (HR 100 bpm). Pt reporting LE weakness and was a little shaky in general. Trialed pt on room air--pt's SpO2 sats were 87%. On 3 L O2 via nasal cannula pt's SpO2 sats 92% at rest beginning/end of session; SpO2 sats 85% initially post ambulation (for about 20 seconds) but improved to 88-89% and then after about 1 minute of rest and pursed lip breathing pt's SpO2 sats up to 92% on 3 L.  MD and pt's nurse updated on pt's vitals/status.   Pt would currently benefit from skilled PT to address noted impairments and functional limitations (see below for any additional details).  Upon hospital discharge, no further PT needs anticipated  (pt and pt's sister did not feel pt needed ongoing therapy upon hospital discharge--pt's sister reports she makes sure to keep pt walking).    If plan is discharge home, recommend the following: A little help with walking and/or transfers;A little help with bathing/dressing/bathroom;Assistance with cooking/housework;Assist for transportation;Help with stairs or ramp for entrance   Can travel by private vehicle    Yes    Equipment Recommendations None recommended by PT  Recommendations for Other Services       Functional Status Assessment Patient has had a recent decline in their functional status and demonstrates the ability to make significant improvements in function in a reasonable and predictable amount of time.     Precautions / Restrictions Precautions Precautions: Fall Precaution Comments: Monitor SpO2 sats Restrictions Weight Bearing Restrictions: No      Mobility  Bed Mobility Overal bed mobility: Modified Independent             General bed mobility comments: Semi-supine to/from sitting with mild increased effort to perform on own    Transfers Overall transfer level: Needs assistance Equipment used: None Transfers: Sit to/from Stand Sit to Stand: Supervision           General transfer comment: mild increased effort to stand from bed (x2 trials) but steady    Ambulation/Gait Ambulation/Gait assistance: Contact guard assist Gait Distance (Feet): 100 Feet Assistive device: None Gait Pattern/deviations: Step-through pattern, Decreased step length - right, Decreased step length - left Gait  velocity: decreased     General Gait Details: pt occasionally holding onto furniture for balance (similar to what pt typically does at home); no loss of balance noted  Stairs            Wheelchair Mobility     Tilt Bed    Modified Rankin (Stroke Patients Only)       Balance Overall balance assessment: Needs assistance Sitting-balance support: No  upper extremity supported, Feet supported Sitting balance-Leahy Scale: Normal Sitting balance - Comments: steady reaching outside BOS   Standing balance support: No upper extremity supported, During functional activity Standing balance-Leahy Scale: Good Standing balance comment: pt occasionally holding onto furniture for support during ambulation--similar to what pt does at home (no loss of balance noted)                             Pertinent Vitals/Pain Pain Assessment Pain Assessment: Faces Faces Pain Scale: Hurts a little bit Pain Location: low back (and LE's d/t swelling) Pain Descriptors / Indicators: Discomfort Pain Intervention(s): Limited activity within patient's tolerance, Monitored during session, Repositioned    Home Living Family/patient expects to be discharged to:: Private residence Living Arrangements: Alone Available Help at Discharge: Family;Available PRN/intermittently Type of Home: House Home Access: Stairs to enter;Ramped entrance   Entrance Stairs-Number of Steps: 1   Home Layout: One level Home Equipment: Agricultural consultant (2 wheels);Rollator (4 wheels) Additional Comments: Sister lives next door and comes over multiple times a day to assist as needed    Prior Function Prior Level of Function : History of Falls (last six months) (d/t syncope)             Mobility Comments: Pt modified independent with ambulation (holds onto furniture as needed for support).  2 L home O2 at night.       Extremity/Trunk Assessment   Upper Extremity Assessment Upper Extremity Assessment: Overall WFL for tasks assessed    Lower Extremity Assessment Lower Extremity Assessment: Generalized weakness       Communication   Communication Communication: No apparent difficulties Cueing Techniques: Verbal cues  Cognition Arousal: Alert Behavior During Therapy: WFL for tasks assessed/performed Overall Cognitive Status: Within Functional Limits for tasks  assessed                                          General Comments  Nursing cleared pt for participation in physical therapy.  Pt agreeable to PT session.  Pt's sister present during session.    Exercises     Assessment/Plan    PT Assessment Patient needs continued PT services  PT Problem List Decreased strength;Decreased activity tolerance;Decreased balance;Decreased mobility;Cardiopulmonary status limiting activity;Pain       PT Treatment Interventions DME instruction;Gait training;Functional mobility training;Therapeutic activities;Therapeutic exercise;Balance training;Patient/family education    PT Goals (Current goals can be found in the Care Plan section)  Acute Rehab PT Goals Patient Stated Goal: to improve strength PT Goal Formulation: With patient/family Time For Goal Achievement: 03/16/23 Potential to Achieve Goals: Good    Frequency Min 1X/week     Co-evaluation               AM-PAC PT "6 Clicks" Mobility  Outcome Measure Help needed turning from your back to your side while in a flat bed without using bedrails?: None Help needed moving from lying on  your back to sitting on the side of a flat bed without using bedrails?: None Help needed moving to and from a bed to a chair (including a wheelchair)?: A Little Help needed standing up from a chair using your arms (e.g., wheelchair or bedside chair)?: A Little Help needed to walk in hospital room?: A Little Help needed climbing 3-5 steps with a railing? : A Little 6 Click Score: 20    End of Session Equipment Utilized During Treatment: Gait belt;Oxygen (3 L via nasal cannula) Activity Tolerance: Patient tolerated treatment well Patient left: with call bell/phone within reach (sitting on EOB eating breakfast; pt's sister present) Nurse Communication: Mobility status;Precautions;Other (comment) (pt's vitals during session) PT Visit Diagnosis: Other abnormalities of gait and mobility  (R26.89);Muscle weakness (generalized) (M62.81);History of falling (Z91.81);Pain Pain - part of body:  (low back)    Time: 9147-8295 PT Time Calculation (min) (ACUTE ONLY): 32 min   Charges:   PT Evaluation $PT Eval Low Complexity: 1 Low PT Treatments $Therapeutic Activity: 8-22 mins PT General Charges $$ ACUTE PT VISIT: 1 Visit        Hendricks Limes, PT 03/02/23, 9:50 AM

## 2023-03-02 NOTE — TOC Transition Note (Signed)
Transition of Care [facility]) - CM/SW Discharge Note   Patient Details  Name: Stacey Gross MRN: [id number] Date of Birth: 1960-08-25  Transition of Care [facility]) CM/SW Contact:  Garret Reddish, RN Phone Number: 03/02/2023, 11:13 AM   Clinical Narrative:    Chart reviewed.  Noted that patient has orders for discharge today.  Provider informs me that patient will need new home 02 sat  up in the home.  Patient currently uses home 02 at night and will need continuous home o2 on discharge.    I have spoken with patient.  Stacey Gross informs me that prior to admission she lived at home by herself.  She reports that she is able to get around her home.  She reports that she does have a rolling walker and cane at home if needed.  PT recommended no PT follow up.  Patient has a supportive sister that lives close to her.  Patient uses [facility] for medications.    Patient reports that she is active with [facility] for home 02.  Patient reports that she uses home 02 at night.  She reports that she has a home concentrator at home.  Patient will now require continuous oxygen.    I have spoken to the [facility] answering service and they have taken new home 02 order.  Driver Stacey Gross will call me when he is on his way to bring portable o2 tank to hospital.    I have made staff nurse aware.     Final next level of care: Home/Self Care Barriers to Discharge: No Barriers Identified   Patient Goals and CMS Choice CMS Medicare.gov Compare Post Acute Care list provided to:: Patient Choice offered to / list presented to : Patient  Discharge Placement                      Patient and family notified of of transfer: 03/02/23  Discharge Plan and Services Additional resources added to the After Visit Summary for                  DME Arranged: Oxygen DME Agency: Patsy Lager Date DME Agency Contacted: 03/02/23 Time DME Agency Contacted: 1100 Representative spoke with at DME Agency: [facility] Answering  service team member            Social Determinants of Health (SDOH) Interventions SDOH Screenings   Food Insecurity: No Food Insecurity (03/02/2023)  Housing: Low Risk  (03/02/2023)  Transportation Needs: No Transportation Needs (03/02/2023)  Utilities: Not At Risk (03/02/2023)  Alcohol Screen: Low Risk  (05/23/2022)  Depression (PHQ2-9): High Risk (02/01/2023)  Financial Resource Strain: Low Risk  (05/23/2022)  Physical Activity: Inactive (05/23/2022)  Social Connections: Socially Isolated (05/23/2022)  Stress: No Stress Concern Present (05/23/2022)  Tobacco Use: High Risk (03/01/2023)     Readmission Risk Interventions     No data to display

## 2023-03-03 ENCOUNTER — Telehealth: Payer: Self-pay

## 2023-03-03 NOTE — Telephone Encounter (Signed)
Noted  

## 2023-03-03 NOTE — Transitions of Care (Post Inpatient/ED Visit) (Unsigned)
03/03/2023  Name: Stacey Gross MRN: [id number] DOB: 1960/06/05  Today's TOC FU Call Status: Today's TOC FU Call Status:: Successful TOC FU Call Completed TOC FU Call Complete Date: 03/03/23 Patient's Name and Date of Birth confirmed.  Transition Care Management Follow-up Telephone Call Date of Discharge: 03/02/23 Discharge Facility: [facility]) Type of Discharge: Inpatient Admission Primary Inpatient Discharge Diagnosis:: COPD How have you been since you were released from the hospital?: Same Any questions or concerns?: Yes Patient Questions/Concerns:: Will need assistance getting portable oxygen with [facility] if possible Patient Questions/Concerns Addressed: Notified Provider of Patient Questions/Concerns  Items Reviewed: Did you receive and understand the discharge instructions provided?: Yes Medications obtained,verified, and reconciled?: Yes (Medications Reviewed) Any new allergies since your discharge?: No Dietary orders reviewed?: Yes Type of Diet Ordered:: Heart healthy Do you have support at home?: Yes People in Home: child(ren), adult  Medications Reviewed Today: Medications Reviewed Today     Reviewed by Anthoney Harada, LPN (Licensed Practical Nurse) on 03/03/23 at 1621  Med List Status: <None>   Medication Order Taking? Sig Documenting Provider Last Dose Status Informant  Accu-Chek Softclix Lancets lancets [id number] Yes USE TO CHECK BLOOD SUGAR DAILY Cannady, Jolene T, NP Taking Active Self  albuterol (PROVENTIL) (2.5 MG/3ML) 0.083% nebulizer solution [id number] Yes Take 3 mLs (2.5 mg total) by nebulization every 6 (six) hours as needed for wheezing or shortness of breath. Aura Dials T, NP Taking Active Self  albuterol (VENTOLIN HFA) 108 (90 Base) MCG/ACT inhaler [id number] Yes INHALE 2 PUFFS BY MOUTH INTO THE LUNGS EVERY 6 HOURS AS NEEDED FOR WHEEZING OR SHORTNESS OF BREATH Cannady, Jolene T, NP Taking Active Self   amoxicillin-clavulanate (AUGMENTIN) 875-125 MG tablet [id number] Yes Take 1 tablet by mouth every 12 (twelve) hours for 6 doses. Tresa Moore, MD Taking Active   Blood Glucose Monitoring Suppl (ACCU-CHEK GUIDE ME) w/Device KIT [id number] Yes Use to check blood sugars 2-3 times daily with goals = <130 fasting in morning and <180 two hours after eating.  Bring blood sugar log to appointments. Aura Dials T, NP Taking Active Self  Budeson-Glycopyrrol-Formoterol (BREZTRI AEROSPHERE) 160-9-4.8 MCG/ACT AERO [id number] Yes Inhale 2 puffs into the lungs in the morning and at bedtime. Aura Dials T, NP Taking Active Self  Cholecalciferol 25 MCG (1000 UT) capsule [id number] Yes Take 2,000 Units by mouth daily. [provider] Taking Active Self  cyanocobalamin (VITAMIN B12) 1000 MCG tablet [id number] Yes Take 1 tablet (1,000 mcg total) by mouth daily. Aura Dials T, NP Taking Active Self  cyclobenzaprine (FLEXERIL) 10 MG tablet [id number] Yes Take 10 mg by mouth 3 (three) times daily as needed for muscle spasms. [provider] Taking Active Self  DULoxetine (CYMBALTA) 60 MG capsule [id number] Yes Take 1 capsule (60 mg total) by mouth daily. Aura Dials T, NP Taking Active Self  furosemide (LASIX) 20 MG tablet [id number] Yes TAKE 1 TABLET EVERY DAY AS NEEDED Agbor-Etang, Arlys John, MD Taking Active Self  glucose blood (ACCU-CHEK GUIDE) test strip [id number] Yes Use as instructed to check blood sugar daily Cannady, Jolene T, NP Taking Active Self  hydrOXYzine (VISTARIL) 25 MG capsule [id number] Yes Take 1 capsule (25 mg total) by mouth every 8 (eight) hours as needed. Aura Dials T, NP Taking Active Self  lidocaine (LIDODERM) 5 % [id number] Yes Place 1 patch onto the skin daily. Remove & Discard patch within 12 hours or as directed by MD Marjie Skiff, NP Taking Active Self  olmesartan (BENICAR)  5 MG tablet [id number] Yes TAKE 1 TABLET BY MOUTH DAILY Marjie Skiff, NP Taking  Active Self  OXYGEN [id number] Yes Inhale 2 L into the lungs at bedtime. [provider] Taking Active Self  pantoprazole (PROTONIX) 40 MG tablet [id number] Yes TAKE 1 TABLET BY MOUTH DAILY Cannady, Jolene T, NP Taking Active   predniSONE (DELTASONE) 20 MG tablet [id number] Yes Take 2 tablets (40 mg total) by mouth daily with breakfast for 4 days. Tresa Moore, MD Taking Active   pregabalin (LYRICA) 25 MG capsule [id number] Yes TAKE 1 CAPSULE BY MOUTH TWICE DAILY Cannady, Jolene T, NP Taking Active   prochlorperazine (COMPAZINE) 10 MG tablet [id number] Yes Take 10 mg by mouth every 6 (six) hours as needed for vomiting or nausea. [provider] Taking Active Self  rosuvastatin (CRESTOR) 40 MG tablet [id number] Yes Take 1 tablet (40 mg total) by mouth daily. Marjie Skiff, NP Taking Active Self  sitaGLIPtin (JANUVIA) 100 MG tablet [id number] Yes TAKE 1 TABLET BY MOUTH DAILY Cannady, Jolene T, NP Taking Active Self  traZODone (DESYREL) 100 MG tablet [id number] Yes TAKE 1 TABLET BY MOUTH AT BEDTIME AS NEEDED FOR SLEEP Cannady, Jolene T, NP Taking Active   verapamil (CALAN) 80 MG tablet [id number] Yes TAKE 1 TABLET BY MOUTH DAILY Cannady, Jolene T, NP Taking Active             Home Care and Equipment/Supplies: Were Home Health Services Ordered?: No Any new equipment or medical supplies ordered?: Yes Name of Medical supply agency?: [facility] Were you able to get the equipment/medical supplies?: Yes Do you have any questions related to the use of the equipment/supplies?: Yes What questions do you have?: Portable oxygen options  Functional Questionnaire: Do you need assistance with bathing/showering or dressing?: No Do you need assistance with meal preparation?: No Do you need assistance with eating?: No Do you have difficulty maintaining continence: No Do you need assistance with getting out of bed/getting out of a chair/moving?: No Do you have difficulty managing or taking  your medications?: No  Follow up appointments reviewed: PCP Follow-up appointment confirmed?: Yes Date of PCP follow-up appointment?: 03/14/23 Follow-up Provider: Aura Dials NP Specialist Hospital Follow-up appointment confirmed?: NA Do you need transportation to your follow-up appointment?: No Do you understand care options if your condition(s) worsen?: Yes-patient verbalized understanding    SIGNATURE Kandis Fantasia, LPN [facility] Health Advisor [place] [facility] You Are. We Are. One [facility] Direct Dial [phone]

## 2023-03-04 ENCOUNTER — Other Ambulatory Visit: Payer: Self-pay | Admitting: Nurse Practitioner

## 2023-03-04 DIAGNOSIS — J449 Chronic obstructive pulmonary disease, unspecified: Secondary | ICD-10-CM | POA: Diagnosis not present

## 2023-03-04 DIAGNOSIS — J9611 Chronic respiratory failure with hypoxia: Secondary | ICD-10-CM | POA: Diagnosis not present

## 2023-03-06 ENCOUNTER — Telehealth: Payer: Self-pay

## 2023-03-06 ENCOUNTER — Other Ambulatory Visit: Payer: Self-pay

## 2023-03-06 ENCOUNTER — Ambulatory Visit: Payer: 59 | Attending: Cardiology

## 2023-03-06 DIAGNOSIS — R55 Syncope and collapse: Secondary | ICD-10-CM

## 2023-03-06 MED ORDER — FUROSEMIDE 20 MG PO TABS: 20.0000 mg | ORAL_TABLET | Freq: Every day | ORAL | 0 refills | Status: DC | PRN

## 2023-03-06 NOTE — Telephone Encounter (Signed)
Monitor order placed and message sent to scheduling.    Hammock, Sheri, NP  P Cv Div Burl Scheduling; P Cv Div Burl Triage Please mail patient Zio AT monitor for recurrent syncope for 14 days. Also please schedule a follow up with an APP or Dr Myriam Forehand Sandie Ano in 6 weeks.  Thanks, [employer]

## 2023-03-06 NOTE — Telephone Encounter (Signed)
Requested Prescriptions   Signed Prescriptions Disp Refills   furosemide (LASIX) 20 MG tablet 30 tablet 0    Sig: Take 1 tablet (20 mg total) by mouth daily as needed.    Authorizing Provider: Debbe Odea    Ordering User: Feliberto Harts L    last visit: 11/24/21 with plan to f/u in 4 months. next visit: none  Phone note on 03/06/23 for Zio orders placed per Delphina Cahill, NP with message sent to scheduling to schedule hospital f/u.  Hospital dc on 03/02/23

## 2023-03-07 NOTE — Telephone Encounter (Signed)
Requested Prescriptions  Pending Prescriptions Disp Refills   olmesartan (BENICAR) 5 MG tablet [Pharmacy Med Name: OLMESARTAN MEDOXOMIL 5 MG TAB] 90 tablet 0    Sig: TAKE 1 TABLET BY MOUTH DAILY     Cardiovascular:  Angiotensin Receptor Blockers Failed - 03/04/2023  9:46 AM      Failed - Last BP in normal range    BP Readings from Last 1 Encounters:  03/02/23 (!) 161/88         Passed - Cr in normal range and within 180 days    Creat  Date Value Ref Range Status  07/02/2013 0.79 0.50 - 1.10 mg/dL Final   Creatinine, Ser  Date Value Ref Range Status  03/01/2023 0.87 0.44 - 1.00 mg/dL Final         Passed - K in normal range and within 180 days    Potassium  Date Value Ref Range Status  03/01/2023 3.7 3.5 - 5.1 mmol/L Final         Passed - Patient is not pregnant      Passed - Valid encounter within last 6 months    Recent Outpatient Visits           6 days ago Centrilobular emphysema (HCC)   [place] [facility] [place], [place] T, NP   1 month ago Type 2 diabetes, controlled, with peripheral circulatory disorder (HCC)   [place] [facility] [place], Jolene T, NP   4 months ago Type 2 diabetes, controlled, with peripheral circulatory disorder (HCC)   [place] [facility] [place], Jolene T, NP   5 months ago Chronic bilateral low back pain without sciatica   [place] [facility] [place], [employer] T, NP   6 months ago Hypertension associated with diabetes (HCC)   [place] [facility] [place], [place] T, NP       Future Appointments             In 1 week Cannady, Dorie Rank, NP [place] [facility], [facility]   In 1 month [place], [place] T, NP [place] [facility], [facility]             furosemide (LASIX) 20 MG tablet [Pharmacy Med Name: FUROSEMIDE 20 MG TAB] 90 tablet 3    Sig: TAKE 1 TABLET BY MOUTH DAILY AS NEEDED     Cardiovascular:  Diuretics - Loop Failed -  03/04/2023  9:46 AM      Failed - Ca in normal range and within 180 days    Calcium  Date Value Ref Range Status  03/01/2023 8.3 (L) 8.9 - 10.3 mg/dL Final         Failed - Last BP in normal range    BP Readings from Last 1 Encounters:  03/02/23 (!) 161/88         Passed - K in normal range and within 180 days    Potassium  Date Value Ref Range Status  03/01/2023 3.7 3.5 - 5.1 mmol/L Final         Passed - Na in normal range and within 180 days    Sodium  Date Value Ref Range Status  03/01/2023 137 135 - 145 mmol/L Final  02/01/2023 139 134 - 144 mmol/L Final         Passed - Cr in normal range and within 180 days    Creat  Date Value Ref Range Status  07/02/2013 0.79 0.50 - 1.10 mg/dL Final  Creatinine, Ser  Date Value Ref Range Status  03/01/2023 0.87 0.44 - 1.00 mg/dL Final         Passed - Cl in normal range and within 180 days    Chloride  Date Value Ref Range Status  03/01/2023 99 98 - 111 mmol/L Final         Passed - Mg Level in normal range and within 180 days    Magnesium  Date Value Ref Range Status  03/01/2023 1.7 1.7 - 2.4 mg/dL Final    Comment:    Performed at [facility], [address] [place], [place] [address]         Passed - Valid encounter within last 6 months    Recent Outpatient Visits           6 days ago Centrilobular emphysema (HCC)   [place] [facility] [place], [place] T, NP   1 month ago Type 2 diabetes, controlled, with peripheral circulatory disorder (HCC)   [place] [facility] [place], [place] T, NP   4 months ago Type 2 diabetes, controlled, with peripheral circulatory disorder (HCC)   [place] [facility] [place], [place] T, NP   5 months ago Chronic bilateral low back pain without sciatica   [place] [facility] [place], Corrie Dandy T, NP   6 months ago Hypertension associated with diabetes (HCC)   [place] [facility] [place],  Dorie Rank, NP       Future Appointments             In 1 week Cannady, Dorie Rank, NP [place] [facility], [facility]   In 1 month [place], Dorie Rank, NP [place] [facility], [facility]

## 2023-03-08 ENCOUNTER — Telehealth: Payer: Self-pay | Admitting: Nurse Practitioner

## 2023-03-08 DIAGNOSIS — J449 Chronic obstructive pulmonary disease, unspecified: Secondary | ICD-10-CM | POA: Diagnosis not present

## 2023-03-08 NOTE — Telephone Encounter (Signed)
Copied from CRM [id number]. Topic: Appointment Scheduling - Scheduling Inquiry for Clinic >> Mar 08, 2023 12:04 PM Shon Hale wrote: Reason for CRM: Pt needing to r/s hospital f/u for 03/14/2023. Pt has conflicting appointment at [facility] in [place], [place]. Pt discharged 03/02/2023; no available appts before or after within 14 days of discharge. Please call pt back to r/s

## 2023-03-08 NOTE — Telephone Encounter (Signed)
Noted  

## 2023-03-08 NOTE — Telephone Encounter (Signed)
Patient worked out issue with other appointments and will be able to come to her scheduled appointment.

## 2023-03-10 DIAGNOSIS — R55 Syncope and collapse: Secondary | ICD-10-CM | POA: Diagnosis not present

## 2023-03-11 DIAGNOSIS — R55 Syncope and collapse: Secondary | ICD-10-CM | POA: Diagnosis not present

## 2023-03-11 NOTE — Patient Instructions (Incomplete)
Pulmonary scheduled for Thursday at 9:30  Living with COPD Being diagnosed with chronic obstructive pulmonary disease (COPD) changes your life physically and emotionally. Having COPD can affect your ability to work and do things you enjoy. COPD is not the same for everyone, and it may change over time. Your health care providers can help you come up with the COPD management plan that works best for you. How to manage lifestyle changes Treatment plan Work closely with your health care providers. Follow your COPD management plan. This plan includes: Instructions about activities, exercises, diet, medicines, what to do when COPD flares up, and when to call your health care provider. A pulmonary rehabilitation program. In pulmonary rehab, you will learn about COPD, do exercises for fitness and breathing, and get support from health care providers and other people who have COPD. Managing emotions and stress Living with a chronic disease means you may also struggle with stressful emotions, such as sadness, fear, and worry. Here are some ways to manage these emotions: Talk to someone about your fear, anxiety, depression, or stress. Learn strategies to avoid or reduce stress and ask for help if you are struggling with depression or anxiety. Consider joining a COPD support group, online or in person.  Adjusting to changes COPD may limit the things you can do, but you can make certain changes to help you cope with the diagnosis. Ask for help when you need it. Getting support from friends, family, and your health care team is an important part of managing the condition. Try to get regular exercise as prescribed by a health care provider or pulmonary rehab team. Exercising can help COPD, even if you are a bit short of breath. Take steps to prevent infection and protect your lungs: Wash your hands often and avoid being in crowds. Stay away from friends and family members who are sick. Check your local air  quality each day, and stay out of areas where air pollution is likely. How to recognize changes in your condition Recognizing changes in your COPD COPD is a progressive disease. It is important to let the health care team know if your COPD is getting worse. Your treatment plan may need to change. Watch for: Increased shortness of breath, wheezing, cough, or fatigue. Loss of ability to exercise or perform daily activities, like climbing stairs. More frequent symptom flares. Signs of depression or anxiety. Recognizing stress It is normal to have additional stress when you have COPD. However, prolonged stress and anxiety can make COPD worse and lead to depression. Recognize the warning signs, which include: Feeling sad or worried more often or most of the time. Having less energy and losing interest in pleasurable activities. Changes in your appetite or sleeping patterns. Being easily angered or irritated. Having unexplained aches and pains, digestive problems, or headaches. Follow these instructions at home: Eating and drinking  Eat foods that are high in fiber, such as fresh fruits and vegetables, whole grains, and beans. Limit foods that are high in fat and processed sugars, such as fried or sweet foods. Follow a balanced diet and maintain a healthy weight. Being overweight or underweight can make COPD worse. You may work with a Data processing manager as part of your pulmonary rehab program. Drink enough fluid to keep your urine pale yellow. If you drink alcohol: Limit how much you have to: 0-1 drink a day for women who are not pregnant. 0-2 drinks a day for men. Know how much alcohol is in your drink. In the [place]  one drink equals one 12 oz bottle of beer (355 mL), one 5 oz glass of wine (148 mL), or one 1 oz glass of hard liquor (44 mL). Lifestyle If you smoke, the most important thing that you can do is to stop smoking. Continuing to smoke will cause the disease to progress faster. Do not use any  products that contain nicotine or tobacco. These products include cigarettes, chewing tobacco, and vaping devices, such as e-cigarettes. If you need help quitting, ask your health care provider. Avoid exposure to things that irritate your lungs, such as smoke, chemicals, and fumes. Activity Balance exercise and rest. Take short walks every 1-2 hours. This is important to improve blood flow and breathing. Ask for help if you feel weak or unsteady. Do exercises that include controlled breathing with body movement, such as tai chi. General instructions Take over-the-counter and prescription medicines only as told by your health care provider. Take vitamin and protein supplements as told by your health care provider or dietitian. Practice good oral hygiene and see your dental care provider regularly. An oral infection can also spread to your lungs. Make sure you receive all the vaccines that your health care provider recommends. Keep all follow-up visits. This is important. Contact a health care provider if you: Are struggling to manage your COPD. Have emotional stress that interferes with your ability to cope with COPD. Get help right away if you: Have thoughts of suicide, death, or hurting yourself or others. If you ever feel like you may hurt yourself or others, or have thoughts about taking your own life, get help right away. Go to your nearest emergency department or: Call your local emergency services (911 in the [place] Call a suicide crisis helpline, such as the National Suicide Prevention Lifeline at (854) 414-0341 or 988 in the [place] This is open 24 hours a day in the [place] Text the Crisis Text Line at (808) 633-3190 (in the [place] Summary Being diagnosed with chronic obstructive pulmonary disease (COPD) changes your life physically and emotionally. Work with your health care providers and follow your COPD management plan. A pulmonary rehabilitation program is an important part of COPD  management. Prolonged stress, anxiety, and depression can make COPD worse. Let your health care provider know if emotional stress interferes with your ability to cope with and manage COPD. This information is not intended to replace advice given to you by your health care provider. Make sure you discuss any questions you have with your health care provider. Document Revised: 10/14/2020 Document Reviewed: 04/08/2020 Elsevier Patient Education  2024 [employer].

## 2023-03-14 ENCOUNTER — Ambulatory Visit (INDEPENDENT_AMBULATORY_CARE_PROVIDER_SITE_OTHER): Payer: 59 | Admitting: Nurse Practitioner

## 2023-03-14 ENCOUNTER — Encounter: Payer: Self-pay | Admitting: Nurse Practitioner

## 2023-03-14 VITALS — BP 109/70 | HR 78 | Temp 98.1°F | Resp 18 | Ht 67.0 in | Wt 206.2 lb

## 2023-03-14 DIAGNOSIS — J432 Centrilobular emphysema: Secondary | ICD-10-CM | POA: Diagnosis not present

## 2023-03-14 DIAGNOSIS — J9611 Chronic respiratory failure with hypoxia: Secondary | ICD-10-CM

## 2023-03-14 DIAGNOSIS — E6609 Other obesity due to excess calories: Secondary | ICD-10-CM

## 2023-03-14 DIAGNOSIS — E66811 Obesity, class 1: Secondary | ICD-10-CM

## 2023-03-14 DIAGNOSIS — J9621 Acute and chronic respiratory failure with hypoxia: Secondary | ICD-10-CM

## 2023-03-14 DIAGNOSIS — J9612 Chronic respiratory failure with hypercapnia: Secondary | ICD-10-CM | POA: Diagnosis not present

## 2023-03-14 DIAGNOSIS — R634 Abnormal weight loss: Secondary | ICD-10-CM | POA: Diagnosis not present

## 2023-03-14 DIAGNOSIS — F17219 Nicotine dependence, cigarettes, with unspecified nicotine-induced disorders: Secondary | ICD-10-CM

## 2023-03-14 DIAGNOSIS — Z6832 Body mass index (BMI) 32.0-32.9, adult: Secondary | ICD-10-CM

## 2023-03-14 MED ORDER — SITAGLIPTIN PHOSPHATE 100 MG PO TABS: 100.0000 mg | ORAL_TABLET | Freq: Every day | ORAL | 4 refills | Status: DC

## 2023-03-14 MED ORDER — TRAZODONE HCL 100 MG PO TABS: 100.0000 mg | ORAL_TABLET | Freq: Every evening | ORAL | 4 refills | Status: DC | PRN

## 2023-03-14 MED ORDER — VERAPAMIL HCL 80 MG PO TABS: 80.0000 mg | ORAL_TABLET | Freq: Every day | ORAL | 4 refills | Status: DC

## 2023-03-14 MED ORDER — FLUCONAZOLE 150 MG PO TABS
150.0000 mg | ORAL_TABLET | Freq: Once | ORAL | 0 refills | Status: AC
Start: 2023-03-14 — End: 2023-03-14

## 2023-03-14 MED ORDER — OLMESARTAN MEDOXOMIL 5 MG PO TABS: 5.0000 mg | ORAL_TABLET | Freq: Every day | ORAL | 4 refills | Status: DC

## 2023-03-14 MED ORDER — NYSTATIN 100000 UNIT/ML MT SUSP: 5.0000 mL | Freq: Four times a day (QID) | OROMUCOSAL | 0 refills | Status: DC

## 2023-03-14 NOTE — Assessment & Plan Note (Signed)
Continue to collaborate with pulmonary.  Tolerating O2, continue use of this.

## 2023-03-14 NOTE — Assessment & Plan Note (Signed)
BMI 32.03 with some weight loss.  Recommended eating smaller high protein, low fat meals more frequently and exercising 30 mins a day 5 times a week with a goal of 10-15lb weight loss in the next 3 months. Patient voiced their understanding and motivation to adhere to these recommendations.

## 2023-03-14 NOTE — Assessment & Plan Note (Signed)
Chronic, ongoing.  Continue Breztri daily and Albuterol as needed.  Recommend complete cessation of smoking.  Continue annual CT scans.  Continue collaboration with pulmonary, she is scheduled to see them on Thursday.  Continue to wear oxygen consistently at 3L [place].

## 2023-03-14 NOTE — Assessment & Plan Note (Signed)
I have recommended complete cessation of tobacco use. I have discussed various options available for assistance with tobacco cessation including over the counter methods (Nicotine gum, patch and lozenges). We also discussed prescription options (Chantix, Nicotine Inhaler / Nasal Spray). The patient is not interested in pursuing any prescription tobacco cessation options at this time.  Continue yearly lung screening.  

## 2023-03-14 NOTE — Progress Notes (Signed)
BP 109/70 (BP Location: Left Arm, Patient Position: Sitting)   Pulse 78   Temp 98.1 F (36.7 C) (Oral)   Resp 18   Ht 5\' 7"  (1.702 m)   Wt 206 lb 3.2 oz (93.5 kg)   SpO2 97% Comment: 3L  BMI 32.30 kg/m    Subjective:    Patient ID: Stacey Gross, female    DOB: [birth year], 62 y.o.   MRN: [id number]  HPI: Stacey Gross is a 62 y.o. female  Chief Complaint  Patient presents with   Hospitalization Follow-up   Transition of Care Hospital Follow up.  Recent admission to hospital on 03/01/23 for COPD exacerbation, O2 sats has been in 80's in office at the time.  She was discharged on 03/02/23.  She has recently been losing weight, 15 pounds since August. She currently has Zio in place to wear until the 20th.  Currently using O2 which she is wearing constantly now, she is no longer getting dizzy with this.  She is using 3L at present. Is using Breztri.  Currently has yeast infection in mouth + has yeast infection to vaginal area due to abx therapy.    Her appetite continues to not be that great, but is eating more then prior. Not getting as nauseous with food up to mouth. Overall reports she is feeling better.  Currently able to breath better.  She has finished her Prednisone that she was sent home with.  Last A1c was 5.9% in October.  No current abdominal pain. Denies any hemoptysis, night sweats, fever. 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 Last CT abdomen on 04/27/21 noted hepatic steatosis.  Has not gone for CT lung screening as of yet this year.  "Brief/Interim Summary:  62 y.o. female with medical history significant of COPD, 2 L home O2 at night, HFpEF, hyperlipidemia, hypertension, peripheral vascular disease, tobacco abuse presenting with acute on chronic respiratory failure with hypoxia, syncope, COPD exacerbation.  History from patient as well as daughter at the bedside.  Per report, patient with worsening  shortness of breath, cough wheezing over the course of the past several days.  Has not been wearing nighttime oxygen per report.  Still smoking up to 1 to 2 packs/day.  Has also had 2 episodes of syncope during the same timeframe.  Episodes usually happen when patient has prolonged standing with generalized shaking.  Patient does not remember what happens associated with episode.  Denies any known prodrome including weakness, dizziness, chest pain or shortness of breath.  No reported recent change in medication.  Positive decreased appetite over the past week or so.?  Diarrhea.  No focal hemiparesis or confusion.  No reported slurred speech.  No reported direct head trauma.  Was seen by PCP today and redirected to the ER because of persistent hypoxia with O2 sats in the 70s to 80s.    Treated with working diagnosis of decompensated COPD.  Oxygen status did improve during course of admission.  Requiring 3 L nasal cannula at time of discharge.  Will need updated prescription for oxygen with new baseline requirement of 3 L.   Regarding his syncopal events the etiology is somewhat unclear.  Patient denies any clear prodrome to her events.  No recent change in position.  No recent change in diet or fluid intake.  This raises suspicion for arrhythmogenic syncope.  I have discussed with cardiology service who will have device company  mailed a live event monitor patient and they will set up outpatient follow-up with [facility] cardiology.  Patient was last seen by [facility] in 2023 and subsequently lost to follow-up."  Hospital/Facility: [facility] Physician: Dr. Georgeann Oppenheim D/C Date: 03/02/23  Records Requested: 03/14/23 Records Received: 03/14/23 Records Reviewed: 03/14/23  Diagnoses on Discharge: Syncope and COPD exacerbation  Date of interactive Contact within 48 hours of discharge:  Contact was through: phone  Date of 7 day or 14 day face-to-face visit:    within 14 days  Outpatient Encounter Medications as of  03/14/2023  Medication Sig   Accu-Chek Softclix Lancets lancets USE TO CHECK BLOOD SUGAR DAILY   albuterol (PROVENTIL) (2.5 MG/3ML) 0.083% nebulizer solution Take 3 mLs (2.5 mg total) by nebulization every 6 (six) hours as needed for wheezing or shortness of breath.   albuterol (VENTOLIN HFA) 108 (90 Base) MCG/ACT inhaler INHALE 2 PUFFS BY MOUTH INTO THE LUNGS EVERY 6 HOURS AS NEEDED FOR WHEEZING OR SHORTNESS OF BREATH   Blood Glucose Monitoring Suppl (ACCU-CHEK GUIDE ME) w/Device KIT Use to check blood sugars 2-3 times daily with goals = <130 fasting in morning and <180 two hours after eating.  Bring blood sugar log to appointments.   Budeson-Glycopyrrol-Formoterol (BREZTRI AEROSPHERE) 160-9-4.8 MCG/ACT AERO Inhale 2 puffs into the lungs in the morning and at bedtime.   Cholecalciferol 25 MCG (1000 UT) capsule Take 2,000 Units by mouth daily.   cyanocobalamin (VITAMIN B12) 1000 MCG tablet Take 1 tablet (1,000 mcg total) by mouth daily.   cyclobenzaprine (FLEXERIL) 10 MG tablet Take 10 mg by mouth 3 (three) times daily as needed for muscle spasms.   DULoxetine (CYMBALTA) 60 MG capsule Take 1 capsule (60 mg total) by mouth daily.   fluconazole (DIFLUCAN) 150 MG tablet Take 1 tablet (150 mg total) by mouth once for 1 dose.   furosemide (LASIX) 20 MG tablet Take 1 tablet (20 mg total) by mouth daily as needed.   glucose blood (ACCU-CHEK GUIDE) test strip Use as instructed to check blood sugar daily   hydrOXYzine (VISTARIL) 25 MG capsule Take 1 capsule (25 mg total) by mouth every 8 (eight) hours as needed.   lidocaine (LIDODERM) 5 % Place 1 patch onto the skin daily. Remove & Discard patch within 12 hours or as directed by MD   nystatin (MYCOSTATIN) 100000 UNIT/ML suspension Take 5 mLs (500,000 Units total) by mouth 4 (four) times daily.   OXYGEN Inhale 2 L into the lungs at bedtime.   pantoprazole (PROTONIX) 40 MG tablet TAKE 1 TABLET BY MOUTH DAILY   pregabalin (LYRICA) 25 MG capsule TAKE 1 CAPSULE  BY MOUTH TWICE DAILY   prochlorperazine (COMPAZINE) 10 MG tablet Take 10 mg by mouth every 6 (six) hours as needed for vomiting or nausea.   rosuvastatin (CRESTOR) 40 MG tablet Take 1 tablet (40 mg total) by mouth daily.   [DISCONTINUED] olmesartan (BENICAR) 5 MG tablet TAKE 1 TABLET BY MOUTH DAILY   [DISCONTINUED] sitaGLIPtin (JANUVIA) 100 MG tablet TAKE 1 TABLET BY MOUTH DAILY   [DISCONTINUED] traZODone (DESYREL) 100 MG tablet TAKE 1 TABLET BY MOUTH AT BEDTIME AS NEEDED FOR SLEEP   [DISCONTINUED] verapamil (CALAN) 80 MG tablet TAKE 1 TABLET BY MOUTH DAILY   olmesartan (BENICAR) 5 MG tablet Take 1 tablet (5 mg total) by mouth daily.   traZODone (DESYREL) 100 MG tablet Take 1 tablet (100 mg total) by mouth at bedtime as needed. for sleep   verapamil (CALAN) 80 MG tablet Take 1 tablet (  80 mg total) by mouth daily.   [DISCONTINUED] sitaGLIPtin (JANUVIA) 100 MG tablet Take 1 tablet (100 mg total) by mouth daily.   No facility-administered encounter medications on file as of 03/14/2023.    Diagnostic Tests Reviewed/Disposition:     Latest Ref Rng & Units 03/01/2023   11:25 AM 02/01/2023    9:12 AM 12/01/2022   10:25 AM  CBC  WBC 4.0 - 10.5 K/uL 8.8  8.5  11.3   Hemoglobin 12.0 - 15.0 g/dL 65.7  84.6  96.2   Hematocrit 36.0 - 46.0 % 43.7  43.9  39.6   Platelets 150 - 400 K/uL 288  343  262        Latest Ref Rng & Units 03/01/2023   11:25 AM 02/01/2023    9:12 AM 12/01/2022   10:25 AM  CMP  Glucose 70 - 99 mg/dL 952  841  84   BUN 8 - 23 mg/dL 9  7  8    Creatinine 0.44 - 1.00 mg/dL 3.24  4.01  0.27   Sodium 135 - 145 mmol/L 137  139  137   Potassium 3.5 - 5.1 mmol/L 3.7  4.3  3.8   Chloride 98 - 111 mmol/L 99  99  100   CO2 22 - 32 mmol/L 30  26  27    Calcium 8.9 - 10.3 mg/dL 8.3  8.8  8.8   Total Protein 6.5 - 8.1 g/dL 6.5  5.5    Total Bilirubin <1.2 mg/dL 0.8  0.3    Alkaline Phos 38 - 126 U/L 93  101    AST 15 - 41 U/L 15  13    ALT 0 - 44 U/L 8  7      Consults:  cardiology  Discharge Instructions: Follow up with PCP in 1-2 weeks Outpatient follow-up with cardiology  Disease/illness Education: Discussed with patient at bedside today and reviewed  Home Health/Community Services Discussions/Referrals: None  Establishment or re-establishment of referral orders for community resources: None  Discussion with other health care providers: Reviewed all recent notes  Assessment and Support of treatment regimen adherence:Discussed with patient at bedside today and reviewed  Appointments Coordinated with: Discussed with patient at bedside today and reviewed  Education for self-management, independent living, and ADLs:  Discussed with patient at bedside today and reviewed  Relevant past medical, surgical, family and social history reviewed and updated as indicated. Interim medical history since our last visit reviewed. Allergies and medications reviewed and updated.  Review of Systems  Constitutional:  Positive for fatigue (improving). Negative for activity change, appetite change, chills and fever.  Respiratory:  Positive for cough (improving). Negative for chest tightness, shortness of breath and wheezing.   Cardiovascular:  Negative for chest pain, palpitations and leg swelling.  Gastrointestinal: Negative.   Endocrine: Negative for cold intolerance, heat intolerance, polydipsia, polyphagia and polyuria.  Neurological: Negative.   Psychiatric/Behavioral: Negative.      Per HPI unless specifically indicated above     Objective:    BP 109/70 (BP Location: Left Arm, Patient Position: Sitting)   Pulse 78   Temp 98.1 F (36.7 C) (Oral)   Resp 18   Ht 5\' 7"  (1.702 m)   Wt 206 lb 3.2 oz (93.5 kg)   SpO2 97% Comment: 3L  BMI 32.30 kg/m   Wt Readings from Last 3 Encounters:  03/14/23 206 lb 3.2 oz (93.5 kg)  03/01/23 208 lb (94.3 kg)  03/01/23 208 lb 3.2 oz (94.4 kg)  Physical Exam Vitals and nursing note reviewed.  Constitutional:       General: She is awake. She is not in acute distress.    Appearance: Normal appearance. She is well-developed and well-groomed. She is obese. She is not ill-appearing or toxic-appearing.     Comments: Color improved today.  HENT:     Head: Normocephalic.     Right Ear: Hearing, tympanic membrane, ear canal and external ear normal.     Left Ear: Hearing, tympanic membrane, ear canal and external ear normal.     Nose: No rhinorrhea.     Right Sinus: No maxillary sinus tenderness or frontal sinus tenderness.     Left Sinus: No maxillary sinus tenderness or frontal sinus tenderness.     Mouth/Throat:     Mouth: Mucous membranes are moist.     Comments: Erythema to tongue area. Eyes:     General: Lids are normal.        Right eye: No discharge.        Left eye: No discharge.     Conjunctiva/sclera: Conjunctivae normal.     Pupils: Pupils are equal, round, and reactive to light.  Neck:     Thyroid: No thyromegaly.     Vascular: No carotid bruit.  Cardiovascular:     Rate and Rhythm: Normal rate and regular rhythm.     Heart sounds: Normal heart sounds. No murmur heard.    No gallop.  Pulmonary:     Effort: Pulmonary effort is normal. No accessory muscle usage or respiratory distress.     Breath sounds: Wheezing present.     Comments: Occasional expiratory wheeze throughout noted - baseline and improved on exam today. Abdominal:     General: Bowel sounds are normal.     Palpations: Abdomen is soft.  Musculoskeletal:     Cervical back: Normal range of motion and neck supple.     Right lower leg: No edema.     Left lower leg: No edema.  Skin:    General: Skin is warm and dry.     Comments: Scattered pale purple bruising bilateral upper extremities.  Neurological:     Mental Status: She is alert and oriented to person, place, and time.  Psychiatric:        Attention and Perception: Attention normal.        Mood and Affect: Mood normal.        Speech: Speech normal.         Behavior: Behavior normal. Behavior is cooperative.        Thought Content: Thought content normal.     Results for orders placed or performed during the hospital encounter of 03/01/23  Resp panel by RT-PCR (RSV, Flu A&B, Covid) Anterior Nasal Swab   Specimen: Anterior Nasal Swab  Result Value Ref Range   SARS Coronavirus 2 by RT PCR NEGATIVE NEGATIVE   Influenza A by PCR NEGATIVE NEGATIVE   Influenza B by PCR NEGATIVE NEGATIVE   Resp Syncytial Virus by PCR NEGATIVE NEGATIVE  Comprehensive metabolic panel  Result Value Ref Range   Sodium 137 135 - 145 mmol/L   Potassium 3.7 3.5 - 5.1 mmol/L   Chloride 99 98 - 111 mmol/L   CO2 30 22 - 32 mmol/L   Glucose, Bld 149 (H) 70 - 99 mg/dL   BUN 9 8 - 23 mg/dL   Creatinine, Ser 1.61 0.44 - 1.00 mg/dL   Calcium 8.3 (L) 8.9 - 10.3 mg/dL   Total Protein  6.5 6.5 - 8.1 g/dL   Albumin 3.5 3.5 - 5.0 g/dL   AST 15 15 - 41 U/L   ALT 8 0 - 44 U/L   Alkaline Phosphatase 93 38 - 126 U/L   Total Bilirubin 0.8 <1.2 mg/dL   GFR, Estimated >16 >10 mL/min   Anion gap 8 5 - 15  CBC  Result Value Ref Range   WBC 8.8 4.0 - 10.5 K/uL   RBC 4.74 3.87 - 5.11 MIL/uL   Hemoglobin 13.8 12.0 - 15.0 g/dL   HCT 96.0 45.4 - 09.8 %   MCV 92.2 80.0 - 100.0 fL   MCH 29.1 26.0 - 34.0 pg   MCHC 31.6 30.0 - 36.0 g/dL   RDW 11.9 14.7 - 82.9 %   Platelets 288 150 - 400 K/uL   nRBC 0.0 0.0 - 0.2 %  D-dimer, quantitative  Result Value Ref Range   D-Dimer, Quant 2.13 (H) 0.00 - 0.50 ug/mL-FEU  Brain natriuretic peptide  Result Value Ref Range   B Natriuretic Peptide 91.5 0.0 - 100.0 pg/mL  Magnesium  Result Value Ref Range   Magnesium 1.7 1.7 - 2.4 mg/dL  HIV Antibody (routine testing w rflx)  Result Value Ref Range   HIV Screen 4th Generation wRfx Non Reactive Non Reactive  Troponin I (High Sensitivity)  Result Value Ref Range   Troponin I (High Sensitivity) 20 (H) <18 ng/L  Troponin I (High Sensitivity)  Result Value Ref Range   Troponin I (High  Sensitivity) 18 (H) <18 ng/L      Assessment & Plan:   Problem List Items Addressed This Visit       Respiratory   Acute on chronic respiratory failure with hypoxia (HCC) - Primary    Acute and improving.  She is wearing oxygen consistently now at 3L and reports no further syncope.  Continue this.  Is scheduled to see pulmonary on Thursday, appreciate their input.      Relevant Orders   CBC with Differential/Platelet   Comprehensive metabolic panel   Centrilobular emphysema (HCC)    Chronic, ongoing.  Continue Breztri daily and Albuterol as needed.  Recommend complete cessation of smoking.  Continue annual CT scans.  Continue collaboration with pulmonary, she is scheduled to see them on Thursday.  Continue to wear oxygen consistently at 3L [place].      Chronic respiratory failure with hypoxia and hypercapnia (HCC)    Continue to collaborate with pulmonary.  Tolerating O2, continue use of this.        Nervous and Auditory   Nicotine dependence, cigarettes, w unsp disorders    I have recommended complete cessation of tobacco use. I have discussed various options available for assistance with tobacco cessation including over the counter methods (Nicotine gum, patch and lozenges). We also discussed prescription options (Chantix, Nicotine Inhaler / Nasal Spray). The patient is not interested in pursuing any prescription tobacco cessation options at this time.  Continue yearly lung screening.         Other   Obesity    BMI 32.03 with some weight loss.  Recommended eating smaller high protein, low fat meals more frequently and exercising 30 mins a day 5 times a week with a goal of 10-15lb weight loss in the next 3 months. Patient voiced their understanding and motivation to adhere to these recommendations.       Weight loss, abnormal    Continues to lose weight, but reports symptoms improved and she would like to lose  weight at this time.  Has lost 2 more pounds since last visit.Marland Kitchen  Has  lost 15 pounds since August.  History of Uterine and Colon cancer. She has a DNR at home. She is able to make her own decisions and very oriented.        Follow up plan: Return for as scheduled in January.

## 2023-03-14 NOTE — Assessment & Plan Note (Signed)
Continues to lose weight, but reports symptoms improved and she would like to lose weight at this time.  Has lost 2 more pounds since last visit.Marland Kitchen  Has lost 15 pounds since August.  History of Uterine and Colon cancer. She has a DNR at home. She is able to make her own decisions and very oriented.

## 2023-03-14 NOTE — Assessment & Plan Note (Signed)
Acute and improving.  She is wearing oxygen consistently now at 3L and reports no further syncope.  Continue this.  Is scheduled to see pulmonary on Thursday, appreciate their input.

## 2023-03-15 LAB — COMPREHENSIVE METABOLIC PANEL
ALT: 13 [IU]/L (ref 0–32)
AST: 18 [IU]/L (ref 0–40)
Albumin: 4 g/dL (ref 3.9–4.9)
Alkaline Phosphatase: 94 [IU]/L (ref 44–121)
BUN/Creatinine Ratio: 8 — ABNORMAL LOW (ref 12–28)
BUN: 6 mg/dL — ABNORMAL LOW (ref 8–27)
Bilirubin Total: 0.3 mg/dL (ref 0.0–1.2)
CO2: 29 mmol/L (ref 20–29)
Calcium: 9.3 mg/dL (ref 8.7–10.3)
Chloride: 94 mmol/L — ABNORMAL LOW (ref 96–106)
Creatinine, Ser: 0.78 mg/dL (ref 0.57–1.00)
Globulin, Total: 2.1 g/dL (ref 1.5–4.5)
Glucose: 181 mg/dL — ABNORMAL HIGH (ref 70–99)
Potassium: 4.5 mmol/L (ref 3.5–5.2)
Sodium: 136 mmol/L (ref 134–144)
Total Protein: 6.1 g/dL (ref 6.0–8.5)
eGFR: 85 mL/min/{1.73_m2} (ref >59)

## 2023-03-15 LAB — CBC WITH DIFFERENTIAL/PLATELET
Basophils Absolute: 0.1 10*3/uL (ref 0.0–0.2)
Basos: 1 %
EOS (ABSOLUTE): 0.1 10*3/uL (ref 0.0–0.4)
Eos: 1 %
Hematocrit: 43.5 % (ref 34.0–46.6)
Hemoglobin: 13.5 g/dL (ref 11.1–15.9)
Immature Grans (Abs): 0.1 10*3/uL (ref 0.0–0.1)
Immature Granulocytes: 1 %
Lymphocytes Absolute: 2.2 10*3/uL (ref 0.7–3.1)
Lymphs: 21 %
MCH: 27.8 pg (ref 26.6–33.0)
MCHC: 31 g/dL — ABNORMAL LOW (ref 31.5–35.7)
MCV: 90 fL (ref 79–97)
Monocytes Absolute: 0.6 10*3/uL (ref 0.1–0.9)
Monocytes: 5 %
Neutrophils Absolute: 7.6 10*3/uL — ABNORMAL HIGH (ref 1.4–7.0)
Neutrophils: 71 %
Platelets: 262 10*3/uL (ref 150–450)
RBC: 4.85 x10E6/uL (ref 3.77–5.28)
RDW: 13.1 % (ref 11.7–15.4)
WBC: 10.6 10*3/uL (ref 3.4–10.8)

## 2023-03-15 NOTE — Progress Notes (Signed)
Contacted via MyChart   Good morning Chandra, your labs have returned and overall are stable and at baseline: - CBC stable. - Kidney function, creatinine and eGFR, remains normal, as is liver function, AST and ALT.  - Sugar, glucose, trending up a little, watch your diet closely as we may need to restart Januvia.  Any questions? Keep being amazing!!  Thank you for allowing me to participate in your care.  I appreciate you. Kindest regards, Shalynn Jorstad

## 2023-03-16 ENCOUNTER — Ambulatory Visit: Payer: 59 | Admitting: Pulmonary Disease

## 2023-03-16 ENCOUNTER — Encounter: Payer: Self-pay | Admitting: Pulmonary Disease

## 2023-03-16 VITALS — BP 110/62 | HR 91 | Temp 96.6°F | Ht 67.0 in | Wt 206.2 lb

## 2023-03-16 DIAGNOSIS — F17210 Nicotine dependence, cigarettes, uncomplicated: Secondary | ICD-10-CM | POA: Diagnosis not present

## 2023-03-16 DIAGNOSIS — J9611 Chronic respiratory failure with hypoxia: Secondary | ICD-10-CM | POA: Diagnosis not present

## 2023-03-16 DIAGNOSIS — R55 Syncope and collapse: Secondary | ICD-10-CM | POA: Diagnosis not present

## 2023-03-16 DIAGNOSIS — J449 Chronic obstructive pulmonary disease, unspecified: Secondary | ICD-10-CM

## 2023-03-16 DIAGNOSIS — I5189 Other ill-defined heart diseases: Secondary | ICD-10-CM | POA: Diagnosis not present

## 2023-03-16 NOTE — Patient Instructions (Signed)
VISIT SUMMARY:  During today's visit, we discussed your recent episodes of syncope, your current use of continuous oxygen therapy, and your efforts to quit smoking. We also addressed your concerns about oral thrush, nocturnal polyuria, and peripheral edema. We reviewed your current medications and made plans for further evaluations and follow-ups.  YOUR PLAN:  -CHRONIC OBSTRUCTIVE PULMONARY DISEASE (COPD): COPD is a chronic lung disease that makes it hard to breathe. You are on continuous oxygen therapy and using the [facility] inhaler. To prevent oral thrush, rinse your mouth with water and baking soda after using the inhaler. We will evaluate a more portable oxygen source for you and provide a spacer for your inhaler.  -HEART FAILURE: Heart failure means your heart is not pumping blood as well as it should. You are on diuretics to manage fluid accumulation. We will wait for your cardiac evaluation follow-up in January and consider an echocardiogram to assess your heart function.  -SYNCOPE: Syncope is a temporary loss of consciousness usually related to insufficient blood flow to the brain. You have had three episodes and are currently wearing a Zio patch monitor, which will be removed on the 20th. We will continue to monitor your condition.  -SMOKING CESSATION: You are currently smoking half a pack of cigarettes per day and plan to quit once your current supply is finished. To manage potential weight gain after quitting, avoid white foods such as potatoes, rice, and bread.  -GENERAL HEALTH MAINTENANCE: You have decided to avoid the flu shot due to past severe flu-like symptoms after vaccination. We discussed this decision and will continue to monitor your overall health.  INSTRUCTIONS:  Please schedule a follow-up appointment in three months. Continue wearing the Zio patch until the 20th and follow the instructions provided for your medications and oxygen therapy. We will evaluate a more portable  oxygen source and consider an echocardiogram during your cardiac evaluation in January.

## 2023-03-16 NOTE — Progress Notes (Signed)
Subjective:    Patient ID: Stacey Gross, female    DOB: [birth year], 62 y.o.   MRN: [id number]  Patient Care Team: Marjie Skiff, NP as PCP - General (Nurse Practitioner) Debbe Odea, MD as PCP - Cardiology (Cardiology) Steele Sizer, MD (Family Medicine) Salena Saner, MD as Consulting Physician (Pulmonary Disease)  Chief Complaint  Patient presents with   Follow-up    SOB. No wheezing. Cough, dry.    BACKGROUND/INTERVAL: 62 year old current smoker (1-2 PPD, 96 PY) who presents for follow-up on the issue of COPD and shortness of breath. Patient has had issues with increasing shortness of breath since 2022. Her last visit with me was on 15 Aug 2022.  In the interim she was admitted to [facility] on 01 March 2023 due to syncope that was believed to be arrhythmogenic.  Currently with a Zio patch in place.  Has upcoming cardiology appointment.  Never had PFTs ordered in May.  HPI Discussed the use of AI scribe software for clinical note transcription with the patient, who gave verbal consent to proceed.  History of Present Illness   The patient, with a history of cardiac and respiratory conditions, presents with recent episodes of syncope, for which she is currently wearing a Zio patch monitor. She reports itching and burning sensations from the monitor, which is due to be removed on 20 December. The patient is now on continuous oxygen therapy, as advised by the hospital.  Previously she had been only on nocturnal oxygen supplementation.  She expresses difficulty with the weight of the oxygen tanks, particularly following recent back surgery.  The patient has reduced her smoking to half a pack per day and plans to quit entirely once her current supply is exhausted. She expresses concerns about potential weight gain following cessation. Despite reduced appetite and unintentional weight loss, the patient has not made significant dietary changes.  The patient has been  experiencing oral thrush, which she believes may be related to her Breztri inhaler.  She initially believed that this was due to the albuterol nebulizer but this issue was discussed with her and is likely related to the ICS component and Breztri.  We discussed proper rinsing after use of Breztri.  Also discussed use of a spacer with the inhaler which may also help.  The patient also reports nocturia, which has been causing significant disruption to her sleep. She denies dysuria and hematuria.  Nocturia likely due to use of diuretics.  The patient has been experiencing peripheral edema, despite adherence to her diuretic medication.  She does not endorse any other symptomatology today  She never had PFTs ordered in May performed.     DATA 10/29/2020 PFTs: FEV1 1.42 L or 50% predicted, FVC 2.33 L or 64% predicted, FEV1/FVC 61%.  No bronchodilator response.  Air-trapping and hyperinflation noted, diffusion capacity moderately reduced, consistent with moderate COPD with emphysema.   07/14/2021 LDCT: Lung RADS 3 probably benign findings.  No solid pulmonary nodule in the right lower lobe measuring 4.8 mm.  Short-term follow-up in 6 months recommended.  Moderate emphysema noted. 10/20/2021 echocardiogram: LVEF 50 to 55%, grade 2 DD, moderately increased right ventricular wall thickness.  This represented a slight decrease in LVEF and increase in diastolic dysfunction from prior. 11/15/2021 overnight oximetry: Oxygen desaturations to as low as 76%.  Oxygen at 2 L/min nocturnally prescribed. 01/21/2022 LDCT: Lung RADS 2, previously noted lung nodules unchanged.  No nonsolid nodule within the apical segment of the right upper lobe at  5.9 mm. 03/01/2023 CT angio chest: No pulmonary embolism.  Small right pleural effusion with associated compressive atelectatic change in the right lower lobe, no effusion on the left, mass, consolidation or pneumothorax.  No suspicious lung nodules.  Review of Systems A 10  point review of systems was performed and it is as noted above otherwise negative.   Patient Active Problem List   Diagnosis Date Noted   Acute on chronic respiratory failure with hypoxia (HCC) 03/01/2023   CAD (coronary artery disease) 03/01/2023   Prolonged QT interval 03/01/2023   Weight loss, abnormal 02/01/2023   Spondylolisthesis of lumbar region 12/08/2022   Chronic bilateral low back pain 08/02/2022   Chronic diastolic heart failure (HCC) 01/30/2022   Esophageal dysphagia    Vitamin D deficiency 04/01/2021   Chronic respiratory failure with hypoxia and hypercapnia (HCC) 08/14/2020   PVD (peripheral vascular disease) (HCC) 07/25/2020   B12 deficiency 01/07/2020   Obesity 10/04/2019   Status post total shoulder arthroplasty, right 07/24/2019   Senile purpura (HCC) 06/17/2019   Hepatic steatosis 06/03/2019   Nummular dermatitis 05/28/2019   Centrilobular emphysema (HCC) 05/14/2019   Generalized anxiety disorder 12/17/2018   Type 2 diabetes, controlled, with peripheral circulatory disorder (HCC) 03/21/2018   Hx of colonic polyps    Ectopic gastric tissue    Aortic atherosclerosis (HCC) 04/18/2017   Advanced care planning/counseling discussion 02/22/2017   Blue toe syndrome (HCC) 03/04/2016   GERD (gastroesophageal reflux disease) 12/30/2014   Hyperlipidemia associated with type 2 diabetes mellitus (HCC)    Hypertension associated with diabetes (HCC)    Degenerative joint disease (DJD) of hip    S/P lumbar fusion 07/02/2013   Nicotine dependence, cigarettes, w unsp disorders 02/23/2010    Social History   Tobacco Use   Smoking status: Every Day    Current packs/day: 2.00    Average packs/day: 2.0 packs/day for 48.0 years (96.0 ttl pk-yrs)    Types: Cigarettes   Smokeless tobacco: Never   Tobacco comments:    0.75 PPD khj 03/16/2023  Substance Use Topics   Alcohol use: No    Allergies  Allergen Reactions   Doxycycline Hyclate Anaphylaxis, Swelling and Rash    Acetaminophen Swelling    Tongue swelling and breaks out in welts   Aspirin Swelling    Tongue swelling and breaks out in welts   Latex Rash    Gloves, underwear elastic, tape   Vancomycin Rash    All mycins    Current Meds  Medication Sig   Accu-Chek Softclix Lancets lancets USE TO CHECK BLOOD SUGAR DAILY   albuterol (PROVENTIL) (2.5 MG/3ML) 0.083% nebulizer solution Take 3 mLs (2.5 mg total) by nebulization every 6 (six) hours as needed for wheezing or shortness of breath.   albuterol (VENTOLIN HFA) 108 (90 Base) MCG/ACT inhaler INHALE 2 PUFFS BY MOUTH INTO THE LUNGS EVERY 6 HOURS AS NEEDED FOR WHEEZING OR SHORTNESS OF BREATH   Blood Glucose Monitoring Suppl (ACCU-CHEK GUIDE ME) w/Device KIT Use to check blood sugars 2-3 times daily with goals = <130 fasting in morning and <180 two hours after eating.  Bring blood sugar log to appointments.   Budeson-Glycopyrrol-Formoterol (BREZTRI AEROSPHERE) 160-9-4.8 MCG/ACT AERO Inhale 2 puffs into the lungs in the morning and at bedtime.   Cholecalciferol 25 MCG (1000 UT) capsule Take 2,000 Units by mouth daily.   cyanocobalamin (VITAMIN B12) 1000 MCG tablet Take 1 tablet (1,000 mcg total) by mouth daily.   cyclobenzaprine (FLEXERIL) 10 MG tablet Take 10 mg  by mouth 3 (three) times daily as needed for muscle spasms.   DULoxetine (CYMBALTA) 60 MG capsule Take 1 capsule (60 mg total) by mouth daily.   furosemide (LASIX) 20 MG tablet Take 1 tablet (20 mg total) by mouth daily as needed.   glucose blood (ACCU-CHEK GUIDE) test strip Use as instructed to check blood sugar daily   hydrOXYzine (VISTARIL) 25 MG capsule Take 1 capsule (25 mg total) by mouth every 8 (eight) hours as needed.   lidocaine (LIDODERM) 5 % Place 1 patch onto the skin daily. Remove & Discard patch within 12 hours or as directed by MD   nystatin (MYCOSTATIN) 100000 UNIT/ML suspension Take 5 mLs (500,000 Units total) by mouth 4 (four) times daily.   olmesartan (BENICAR) 5 MG tablet Take  1 tablet (5 mg total) by mouth daily.   OXYGEN Inhale 2 L into the lungs at bedtime.   pantoprazole (PROTONIX) 40 MG tablet TAKE 1 TABLET BY MOUTH DAILY   pregabalin (LYRICA) 25 MG capsule TAKE 1 CAPSULE BY MOUTH TWICE DAILY   prochlorperazine (COMPAZINE) 10 MG tablet Take 10 mg by mouth every 6 (six) hours as needed for vomiting or nausea.   rosuvastatin (CRESTOR) 40 MG tablet Take 1 tablet (40 mg total) by mouth daily.   traZODone (DESYREL) 100 MG tablet Take 1 tablet (100 mg total) by mouth at bedtime as needed. for sleep   verapamil (CALAN) 80 MG tablet Take 1 tablet (80 mg total) by mouth daily.    Immunization History  Administered Date(s) Administered   Influenza,inj,Quad PF,6+ Mos 02/09/2016, 02/22/2017, 05/09/2018, 12/17/2018, 01/07/2020   Pneumococcal Polysaccharide-23 02/09/2016   Td 04/04/2004, 05/13/2014   Zoster Recombinant(Shingrix) 01/07/2019, 04/22/2019        Objective:     BP 110/62 (BP Location: Right Wrist, Cuff Size: Normal)   Pulse 91   Temp (!) 96.6 F (35.9 C)   Ht 5\' 7"  (1.702 m)   Wt 206 lb 3.2 oz (93.5 kg)   SpO2 96%   BMI 32.30 kg/m   SpO2: 96 % O2 Device: Nasal cannula O2 Flow Rate (L/min): 3 L/min O2 Type: Continuous O2  GENERAL: Well-developed, obese, fully ambulatory, no conversational dyspnea. HEAD: Normocephalic, atraumatic.  EYES: Pupils equal, round, reactive to light. No scleral icterus.  MOUTH: Edentulous upper, poor dentition lower. NECK: Supple. No thyromegaly. Trachea midline. No JVD. No adenopathy. PULMONARY: Distant breath sounds bilaterally. Coarse with no wheezes or rhonchi. CARDIOVASCULAR: S1 and S2. Regular rate and rhythm. No rubs, murmurs or gallops heard. ABDOMEN: Obese otherwise benign MUSCULOSKELETAL: No joint deformity, no clubbing, no edema.  NEUROLOGIC: No overt focal deficit, no gait disturbance, speech is fluent. SKIN: Intact,warm,dry. PSYCH: Mood and behavior normal.   Ambulatory oxymetry was performed  today:  At rest on room air oxygen saturation was 88%, the patient ambulated at a slow pace with walker, completed 1 laps, O2 nadir 87%, severe shortness of breath.  Resting heart rate was 84 bpm at maximum for this exercise 87 bpm.  Patient was then placed on oxygen starting at 2 L/min, ambulatory monitoring resumed.  The patient had to have oxygen titrated up to 3 L/min to maintain oxygen saturations at 90% or better.     Assessment & Plan:     ICD-10-CM   1. Stage 2 moderate COPD by GOLD classification (HCC)  J44.9 AMB REFERRAL FOR DME    2. Chronic respiratory failure with hypoxia (HCC)  J96.11 AMB REFERRAL FOR DME    3. Syncope, unspecified  syncope type  R55     4. Diastolic dysfunction  I51.89     5. Tobacco dependence due to cigarettes  F17.210       Orders Placed This Encounter  Procedures   AMB REFERRAL FOR DME    Referral Priority:   Routine    Referral Type:   Durable Medical Equipment Purchase    Number of Visits Requested:   1    Discussion:    Chronic Obstructive Pulmonary Disease (COPD) On continuous oxygen therapy with mobility issues due to heavy tanks. Using Breztri inhaler, previously caused oral thrush. Informed about rinsing mouth with water and baking soda post-use to prevent thrush. Discussed medication change if thrush recurs. - Evaluate for a more portable oxygen source - Send prescription to DME company, [place], for portable oxygen source - Provide spacer for [employer] inhaler - Instruct to rinse mouth with water and baking soda after using Breztri inhaler  Heart Failure/Diastolic Dysfunction Fluid accumulation in lungs, on diuretics. Follow-up echocardiogram needed to assess heart function. Discussed potential additional cardiac issues and further evaluation. - Wait for cardiac evaluation follow-up in January - Consider echocardiogram during the cardiac evaluation  Syncope Three episodes of syncope. Currently wearing a Zio patch, scheduled for  removal on the 20th. Cause under investigation. - Continue monitoring with Zio patch until the 20th - Keep cardiology appointment  Smoking Cessation Smoking half a pack of cigarettes per day, plans to quit once current supply is finished. Concerned about potential weight gain post-cessation. Advised to avoid simple carbohydrates, preferential intake of proteins and healthy fats to manage weight.  For carbohydrate ingestion defer to complex carbohydrates in moderation - Advise to avoid simple carbohydrates to manage weight  General Health Maintenance Avoiding flu shot due to past severe flu-like symptoms post-vaccination. - Discussed decision to avoid flu shot due to past adverse reactions  Follow-up - Schedule follow-up appointment in three months.      Advised if symptoms do not improve or worsen, to please contact office for sooner follow up or seek emergency care.    I spent 60 minutes of dedicated to the care of this patient on the date of this encounter to include pre-visit review of records, face-to-face time with the patient discussing conditions above, post visit ordering of testing, clinical documentation with the electronic health record, making appropriate referrals as documented, and communicating necessary findings to members of the patients care team.     C. Danice Goltz, MD Advanced Bronchoscopy PCCM [facility]    *This note was generated using voice recognition software/Dragon and/or AI transcription program.  Despite best efforts to proofread, errors can occur which can change the meaning. Any transcriptional errors that result from this process are unintentional and may not be fully corrected at the time of dictation.

## 2023-03-20 ENCOUNTER — Telehealth: Payer: Self-pay | Admitting: Pulmonary Disease

## 2023-03-20 NOTE — Telephone Encounter (Signed)
I just saw her on 12 December.  The appropriate order for [employer] was sent in.  I requalified her during that visit.  She cannot have a POC due to the need for CONTINUOUS flow O2 during ambulation and requiring 3 L/min.  We did ask [employer] to provide her with the more portable system possible.  She may qualify for a less portable concentrator but this would be the size of a small suitcase.  But the orders were sent on the 12th after her requalification.

## 2023-03-20 NOTE — Telephone Encounter (Signed)
Per Penni Homans with [employer] the patient only had 02 at night only until she was admitted to hospital on 03/01/23 and needed 02 2-3 liters to keep 02 about 90%. Dr. Carren Rang office has been asking about POC also. The patient just got tanks delivered to her home on 03/08/23 per [place] and she will need to order 8 tanks for 3 months consistently for [place] to provide a POC per [employer]'s policy

## 2023-04-04 DIAGNOSIS — J449 Chronic obstructive pulmonary disease, unspecified: Secondary | ICD-10-CM | POA: Diagnosis not present

## 2023-04-04 DIAGNOSIS — J9611 Chronic respiratory failure with hypoxia: Secondary | ICD-10-CM | POA: Diagnosis not present

## 2023-04-08 DIAGNOSIS — J449 Chronic obstructive pulmonary disease, unspecified: Secondary | ICD-10-CM | POA: Diagnosis not present

## 2023-04-10 ENCOUNTER — Other Ambulatory Visit: Payer: Self-pay

## 2023-04-10 MED ORDER — FUROSEMIDE 20 MG PO TABS: 20.0000 mg | ORAL_TABLET | Freq: Every day | ORAL | 0 refills | Status: DC | PRN

## 2023-04-17 NOTE — Progress Notes (Signed)
 Cardiology Office Note    Date:  04/18/2023   ID:  Stacey Gross, DOB 1960-06-10, MRN [id number]  PCP:  Valerio Melanie DASEN, NP  Cardiologist:  Redell Cave, MD  Electrophysiologist:  None   Chief Complaint: Hospital follow-up  History of Present Illness:   Stacey Gross is a 63 y.o. female with history of HFpEF, chronic hypoxic respiratory failure secondary to COPD, syncope, DM, PVD, HTN, HLD, chronic back pain, ongoing tobacco use, and GERD who presents for hospital follow-up.  Coronary CTA in 01/2020 with a calcium  score of 0 and no evidence of CAD.  Outpatient cardiac monitoring in 08/2020 showed a predominant rhythm of sinus with an average rate of 81 bpm (range 47 to 174 bpm), and rare atrial and ventricular ectopy.  Echo in 09/2020 showed an EF of 60 to 65%, no regional wall motion normalities, grade 1 diastolic dysfunction, normal RV systolic function and ventricular cavity size, and no evidence of valvular abnormality.  Most recent echo from 10/2021 showed an EF of 50 to 55%, no regional wall motion abnormalities, grade 2 diastolic dysfunction, normal RV systolic function and ventricular cavity size and right atrial pressure of 3 mmHg.  She was admitted to the hospital in 02/2023 with progressive shortness of breath felt to be related to COPD exacerbation and 2 episodes of syncope.  She had not been wearing her nocturnal oxygen .  She was hypoxic in the 70s to 80s upon arrival to the ER requiring increased supplemental oxygen .  She was smoking up to 1 to 2 packs/day.  She was treated for COPD exacerbation with symptomatic improvement.  Note indicates episodes of syncope were happening when the patient was standing for prolonged timeframe with generalized shaking.  There was no associated dyspnea or chest pain.  High-sensitivity troponin of 20 with a delta troponin of 18.  BNP 91.  D-dimer 2.13.  CTA chest negative for PE with a small right pleural effusion with compressive atelectatic  changes in the right lower lobe noted.  CT head and was nonacute.  Zio patch showed a predominant rhythm of sinus with an average rate of 80 bpm (range 52 to 3 bpm), VT lasting 4 beats, 2 episodes of SVT with the fastest and longest interval lasting 5 beats, and rare atrial and ventricular ectopy.  She comes in accompanied by her sister today and is generally not feeling well.  She indicates ongoing fatigue, lethargy, daytime somnolence, and generalized weakness since her hospitalization.  She feels like she could sleep 24/7.  No prior sleep study, declines sleep evaluation.  She also reports exertional chest pain and fatigue with activity such as ambulating throughout the house.  She reports an episode of chest pain 2 weeks ago while at rest described as pressure and radiated towards the left shoulder, lasting for 5 minutes and spontaneously resolving.  She remains on supplemental oxygen  at 3 L via nasal cannula.  She has decreased her tobacco use from 2 packs daily to approximately 17 cigarettes/day.  She does not recall her syncopal episodes.  She does continue to note palpitations, these were experienced while wearing her Zio patch.   Labs independently reviewed: 03/2023 - BUN 6, serum creatinine 0.78, potassium 4.5, albumin 4.0, AST/ALT normal, Hgb 13.5, PLT 262, magnesium  1.7 01/2023 - TC 98, TG 138, HDL 36, LDL 38, A1c 5.9 04/2022 - TSH normal  Past Medical History:  Diagnosis Date   (HFpEF) heart failure with preserved ejection fraction (HCC)    Asthma  Cancer (HCC)    stomach   Chronic back pain    COPD (chronic obstructive pulmonary disease) (HCC)    Degenerative joint disease (DJD) of hip    Diabetes mellitus without complication (HCC)    Dyspnea    GERD (gastroesophageal reflux disease)    takes Omeprazole  daily   History of bronchitis yrs ago   History of colon polyps    benign   History of staph infection 10 yrs ago   Hyperlipidemia    takes Atorvastatin  daily    Hypertension    Palpitations    Peripheral vascular disease (HCC)    Tobacco use    8 cigarettes daily    Weakness    numbness and tingling in both legs    Wears dentures    full upper    Past Surgical History:  Procedure Laterality Date   APPENDECTOMY     BACK SURGERY  9-10 yrs ago   BREAST BIOPSY Right    Core- neg   COLONOSCOPY     COLONOSCOPY WITH PROPOFOL  N/A 09/20/2017   Procedure: COLONOSCOPY WITH PROPOFOL ;  Surgeon: Janalyn Keene NOVAK, MD;  Location: [facility];  Service: Endoscopy;  Laterality: N/A;   COLONOSCOPY WITH PROPOFOL  N/A 04/24/2018   Procedure: COLONOSCOPY WITH PROPOFOL ;  Surgeon: Jinny Carmine, MD;  Location: [facility];  Service: Endoscopy;  Laterality: N/A;   COLONOSCOPY WITH PROPOFOL  N/A 06/10/2021   Procedure: COLONOSCOPY WITH BIOPSY;  Surgeon: Jinny Carmine, MD;  Location: [facility];  Service: Endoscopy;  Laterality: N/A;   ESOPHAGEAL DILATION  06/10/2021   Procedure: ESOPHAGEAL DILATION;  Surgeon: Jinny Carmine, MD;  Location: [facility];  Service: Endoscopy;;  15-18 mm   ESOPHAGOGASTRODUODENOSCOPY (EGD) WITH PROPOFOL  N/A 09/20/2017   Procedure: ESOPHAGOGASTRODUODENOSCOPY (EGD) WITH PROPOFOL ;  Surgeon: Janalyn Keene NOVAK, MD;  Location: [facility];  Service: Endoscopy;  Laterality: N/A;   ESOPHAGOGASTRODUODENOSCOPY (EGD) WITH PROPOFOL  N/A 11/20/2018   Procedure: ESOPHAGOGASTRODUODENOSCOPY (EGD) WITH PROPOFOL ;  Surgeon: Jinny Carmine, MD;  Location: [facility];  Service: Endoscopy;  Laterality: N/A;   ESOPHAGOGASTRODUODENOSCOPY (EGD) WITH PROPOFOL  N/A 06/10/2021   Procedure: ESOPHAGOGASTRODUODENOSCOPY (EGD) WITH BIOPSY;  Surgeon: Jinny Carmine, MD;  Location: [facility];  Service: Endoscopy;  Laterality: N/A;  Diabetic Latex   EYE SURGERY Bilateral    cataract extraction   PERIPHERAL VASCULAR CATHETERIZATION Left 03/17/2016   Procedure: Lower Extremity Angiography;  Surgeon: Selinda GORMAN Gu, MD;  Location: [facility]  INVASIVE CV LAB;  Service: Cardiovascular;  Laterality: Left;   POLYPECTOMY N/A 06/10/2021   Procedure: POLYPECTOMY;  Surgeon: Jinny Carmine, MD;  Location: [facility];  Service: Endoscopy;  Laterality: N/A;   TOTAL ABDOMINAL HYSTERECTOMY W/ BILATERAL SALPINGOOPHORECTOMY     total   TOTAL SHOULDER ARTHROPLASTY Right 07/24/2019   Procedure: RIGHT TOTAL SHOULDER ARTHROPLASTY;  Surgeon: Leora Lynwood SAUNDERS, MD;  Location: [facility] OR[facility];  Service: Orthopedics;  Laterality: Right;    Current Medications: Current Meds  Medication Sig   Accu-Chek Softclix Lancets lancets USE TO CHECK BLOOD SUGAR DAILY   albuterol  (PROVENTIL ) (2.5 MG/3ML) 0.083% nebulizer solution Take 3 mLs (2.5 mg total) by nebulization every 6 (six) hours as needed for wheezing or shortness of breath.   albuterol  (VENTOLIN  HFA) 108 (90 Base) MCG/ACT inhaler INHALE 2 PUFFS BY MOUTH INTO THE LUNGS EVERY 6 HOURS AS NEEDED FOR WHEEZING OR SHORTNESS OF BREATH   Blood Glucose Monitoring Suppl (ACCU-CHEK GUIDE ME) w/Device KIT Use to check blood sugars 2-3 times daily with goals = <130 fasting in morning  and <180 two hours after eating.  Bring blood sugar log to appointments.   Cholecalciferol  25 MCG (1000 UT) capsule Take 2,000 Units by mouth daily.   cyanocobalamin  (VITAMIN B12) 1000 MCG tablet Take 1 tablet (1,000 mcg total) by mouth daily.   cyclobenzaprine  (FLEXERIL ) 10 MG tablet Take 10 mg by mouth 3 (three) times daily as needed for muscle spasms.   DULoxetine  (CYMBALTA ) 60 MG capsule Take 1 capsule (60 mg total) by mouth daily.   furosemide  (LASIX ) 20 MG tablet Take 1 tablet (20 mg total) by mouth daily as needed. Please keep appointment on 04/18/2023 for further refills. Thank you.   glucose blood (ACCU-CHEK GUIDE) test strip Use as instructed to check blood sugar daily   hydrOXYzine  (VISTARIL ) 25 MG capsule Take 1 capsule (25 mg total) by mouth every 8 (eight) hours as needed.   lidocaine  (LIDODERM ) 5 % Place 1 patch onto the skin  daily. Remove & Discard patch within 12 hours or as directed by MD   metoprolol  tartrate (LOPRESSOR ) 100 MG tablet TAKE 1 TABLET 2 HR PRIOR TO CARDIAC PROCEDURE   nystatin  (MYCOSTATIN ) 100000 UNIT/ML suspension Take 5 mLs (500,000 Units total) by mouth 4 (four) times daily.   olmesartan  (BENICAR ) 5 MG tablet Take 1 tablet (5 mg total) by mouth daily.   OXYGEN  Inhale 3 L into the lungs at bedtime.   pantoprazole  (PROTONIX ) 40 MG tablet TAKE 1 TABLET BY MOUTH DAILY   pregabalin  (LYRICA ) 25 MG capsule TAKE 1 CAPSULE BY MOUTH TWICE DAILY   prochlorperazine  (COMPAZINE ) 10 MG tablet Take 10 mg by mouth every 6 (six) hours as needed for vomiting or nausea.   rosuvastatin  (CRESTOR ) 40 MG tablet Take 1 tablet (40 mg total) by mouth daily.   traZODone  (DESYREL ) 100 MG tablet Take 1 tablet (100 mg total) by mouth at bedtime as needed. for sleep   verapamil  (CALAN ) 80 MG tablet Take 1 tablet (80 mg total) by mouth daily.    Allergies:   Doxycycline  hyclate, Acetaminophen , Aspirin , Latex, and Vancomycin   Social History   Socioeconomic History   Marital status: Widowed    Spouse name: Not on file   Number of children: 0   Years of education: 8   Highest education level: Not on file  Occupational History   Occupation: disabled  Tobacco Use   Smoking status: Every Day    Current packs/day: 2.00    Average packs/day: 2.0 packs/day for 48.0 years (96.0 ttl pk-yrs)    Types: Cigarettes   Smokeless tobacco: Never   Tobacco comments:    0.75 PPD khj 03/16/2023  Vaping Use   Vaping status: Never Used  Substance and Sexual Activity   Alcohol use: No   Drug use: No   Sexual activity: Not on file  Other Topics Concern   Not on file  Social History Narrative   Not on file   Social Drivers of Health   Financial Resource Strain: Low Risk  (05/23/2022)   Overall Financial Resource Strain (CARDIA)    Difficulty of Paying Living Expenses: Not hard at all  Food Insecurity: No Food Insecurity  (03/02/2023)   Hunger Vital Sign    Worried About Running Out of Food in the Last Year: Never true    Ran Out of Food in the Last Year: Never true  Transportation Needs: No Transportation Needs (03/02/2023)   PRAPARE - Administrator, Civil Service (Medical): No    Lack of Transportation (Non-Medical): No  Physical Activity: Inactive (05/23/2022)   Exercise Vital Sign    Days of Exercise per Week: 0 days    Minutes of Exercise per Session: 0 min  Stress: No Stress Concern Present (05/23/2022)   [employer] of Occupational Health - Occupational Stress Questionnaire    Feeling of Stress : Not at all  Social Connections: Socially Isolated (05/23/2022)   Social Connection and Isolation Panel [NHANES]    Frequency of Communication with Friends and Family: More than three times a week    Frequency of Social Gatherings with Friends and Family: More than three times a week    Attends Religious Services: Never    Database Administrator or Organizations: No    Attends Engineer, Structural: Never    Marital Status: Divorced     Family History:  The patient's family history includes Hyperlipidemia in her father and mother; Melanoma in her sister. There is no history of Breast cancer.  ROS:   12-point review of systems is negative unless otherwise noted in the HPI.   EKGs/Labs/Other Studies Reviewed:    Studies reviewed were summarized above. The additional studies were reviewed today:  Outpatient cardiac monitor 03/2023: Patient had a min HR of 50 bpm, max HR of 203 bpm, and avg HR of 80 bpm. Predominant underlying rhythm was Sinus Rhythm. 1 run of Ventricular Tachycardia occurred lasting 4 beats with a max rate of 203 bpm (avg 186 bpm). 2 Supraventricular Tachycardia  runs occurred, the run with the fastest interval lasting 5 beats with a max rate of 141 bpm (avg 132 bpm); the run with the fastest interval was also the longest. Isolated SVEs were rare (<1.0%), SVE  Couplets were rare (<1.0%), and SVE Triplets were rare  (<1.0%). Isolated VEs were rare (<1.0%, 358), VE Couplets were rare (<1.0%, 39), and VE Triplets were rare (<1.0%, 1). Ventricular Trigeminy was present.    Conclusion Minimal heart rate 50, max heart rate 203, average heart rate 80. 1 run of nonsustained VT lasting 4 beats. 2 paroxysmal SVT, fastest episode lasting 5 beats. No significant sustained arrhythmias noted. No atrial fibrillation or atrial flutter noted. __________  2D echo 10/20/2021: 1. Left ventricular ejection fraction, by estimation, is 50 to 55%. The  left ventricle has low normal function. The left ventricle has no regional  wall motion abnormalities. Left ventricular diastolic parameters are  consistent with Grade II diastolic  dysfunction (pseudonormalization).   2. Right ventricular systolic function is normal. The right ventricular  size is normal. Moderately increased right ventricular wall thickness.   3. The mitral valve is normal in structure. No evidence of mitral valve  regurgitation.   4. The aortic valve was not well visualized. Aortic valve regurgitation  is not visualized.   5. The inferior vena cava is normal in size with greater than 50%  respiratory variability, suggesting right atrial pressure of 3 mmHg.  __________  2D echo 09/17/2020: 1. Left ventricular ejection fraction, by estimation, is 60 to 65%. The  left ventricle has normal function. The left ventricle has no regional  wall motion abnormalities. Left ventricular diastolic parameters are  consistent with Grade I diastolic  dysfunction (impaired relaxation).   2. Right ventricular systolic function is normal. The right ventricular  size is normal. Tricuspid regurgitation signal is inadequate for assessing  PA pressure.   3. The mitral valve is normal in structure. No evidence of mitral valve  regurgitation. No evidence of mitral stenosis.  __________  Outpatient cardiac  monitoring  08/2020: Patient had a min HR of 47 bpm, max HR of 174 bpm, and avg HR of 81 bpm. Predominant underlying rhythm was Sinus Rhythm.  Occasional PACs and PVCs noted, no significant arrhythmias noted. Isolated SVEs were rare (<1.0%), SVE Couplets were rare (<1.0%), and SVE Triplets were rare (<1.0%).  Patient triggered events associated with sinus rhythm.  No significant arrhythmias noted. __________  Coronary CTA 01/23/2020: Aorta: Normal size. Mild aortic root wall calcifications. No dissection.   Aortic Valve:  Trileaflet.  No calcifications.   Coronary Arteries:  Normal coronary origin.  Right dominance.   RCA is a large dominant artery that gives rise to PDA and PLA. There is no plaque.   Left main is a large artery that gives rise to Ramus, LAD and LCX arteries. There is no disease in the ramus   LAD is a large vessel that has no plaque.   LCX is a non-dominant artery that gives rise to an obtuse marginal branch. There is no plaque.   Other findings:   Normal pulmonary vein drainage into the left atrium.   Normal left atrial appendage without a thrombus.   Normal size of the pulmonary artery.   IMPRESSION: 1. Coronary calcium  score of 0. Patient is low risk for near term coronary events 2. Normal coronary origin with right dominance. 3. No evidence of CAD.    EKG:  EKG is not ordered today.  Zio XT reviewed.   Recent Labs: 05/04/2022: TSH 1.210 03/01/2023: B Natriuretic Peptide 91.5; Magnesium  1.7 03/14/2023: ALT 13; BUN 6; Creatinine, Ser 0.78; Hemoglobin 13.5; Platelets 262; Potassium 4.5; Sodium 136  Recent Lipid Panel    Component Value Date/Time   CHOL 98 (L) 02/01/2023 0912   CHOL 194 02/22/2017 0847   TRIG 138 02/01/2023 0912   TRIG 227 (H) 02/22/2017 0847   HDL 36 (L) 02/01/2023 0912   CHOLHDL 4.1 05/13/2020 0934   VLDL 45 (H) 02/22/2017 0847   LDLCALC 38 02/01/2023 0912    PHYSICAL EXAM:    VS:  BP 124/85 (BP Location: Left Arm, Patient Position:  Sitting, Cuff Size: Normal)   Pulse 93   Ht 5' 7 (1.702 m)   Wt 201 lb 12.8 oz (91.5 kg)   SpO2 97%   BMI 31.61 kg/m   BMI: Body mass index is 31.61 kg/m.  Physical Exam Vitals reviewed.  Constitutional:      Appearance: She is well-developed.  HENT:     Head: Normocephalic and atraumatic.     Nose:     Comments: Nasal cyanosis. Eyes:     General:        Right eye: No discharge.        Left eye: No discharge.  Neck:     Vascular: No JVD.  Cardiovascular:     Rate and Rhythm: Normal rate and regular rhythm.     Heart sounds: Normal heart sounds, S1 normal and S2 normal. Heart sounds not distant. No midsystolic click and no opening snap. No murmur heard.    No friction rub.  Pulmonary:     Effort: Pulmonary effort is normal. No respiratory distress.     Breath sounds: Decreased breath sounds present. No wheezing, rhonchi or rales.     Comments: Diminished and coarse breath sounds bilaterally.  Supplemental oxygen  via nasal cannula noted. Chest:     Chest wall: No tenderness.  Abdominal:     General: There is no distension.  Musculoskeletal:     Cervical  back: Normal range of motion.     Right lower leg: No edema.     Left lower leg: No edema.  Skin:    General: Skin is warm and dry.     Nails: There is no clubbing.  Neurological:     Mental Status: She is alert and oriented to person, place, and time.  Psychiatric:        Speech: Speech normal.        Behavior: Behavior normal.        Thought Content: Thought content normal.        Judgment: Judgment normal.     Wt Readings from Last 3 Encounters:  04/18/23 201 lb 12.8 oz (91.5 kg)  03/16/23 206 lb 3.2 oz (93.5 kg)  03/14/23 206 lb 3.2 oz (93.5 kg)     ASSESSMENT & PLAN:   Syncope: Uncertain etiology.  She reports 3 total episodes of syncope that were not preceded with prodrome.  She does not recall the events surrounding these episodes.  2 most recent episodes occurred leading up to her admission where she  was also noted to be significantly hypoxic with oxygen  saturations in the 70s upon arrival to the ED.  Query if this is contributing to her syncope.  Zio patch without significant arrhythmia, high-grade AV block, or prolonged pauses.  Plan for coronary CTA as outlined below, echo, and carotid artery ultrasound.  No driving for 6 months, or until etiology is identified and adequately treated.  Elevated high-sensitivity troponin: Likely supply/demand ischemia in the setting of hypoxia related to COPD exacerbation and need for supplemental oxygen .  Schedule coronary CTA.  Not currently on aspirin  secondary to angioedema.  HFpEF: Appears euvolemic.  Not requiring standing loop diuretic.  Obtain echo with recommendation to escalate GDMT as indicated.  Chronic hypoxic respiratory failure secondary to COPD with ongoing tobacco use: Suspect her underlying pulmonary disease is the main contributing factor to the way she feels at this time.  Ongoing management per pulmonology.  Complete smoking cessation is recommended.  Abnormal CT imaging: Findings discussed with patient and sister today.  She will follow-up with PCP.    Disposition: F/u with Dr. Darliss in 2 months.   Medication Adjustments/Labs and Tests Ordered: Current medicines are reviewed at length with the patient today.  Concerns regarding medicines are outlined above. Medication changes, Labs and Tests ordered today are summarized above and listed in the Patient Instructions accessible in Encounters.   Signed, Bernardino Bring, PA-C 04/18/2023 1:20 PM     [facility] [address] [place], [place] [address] [phone]

## 2023-04-18 ENCOUNTER — Ambulatory Visit: Payer: 59 | Attending: Physician Assistant | Admitting: Physician Assistant

## 2023-04-18 ENCOUNTER — Encounter: Payer: Self-pay | Admitting: Physician Assistant

## 2023-04-18 VITALS — BP 124/85 | HR 93 | Ht 67.0 in | Wt 201.8 lb

## 2023-04-18 DIAGNOSIS — R55 Syncope and collapse: Secondary | ICD-10-CM | POA: Diagnosis not present

## 2023-04-18 DIAGNOSIS — J449 Chronic obstructive pulmonary disease, unspecified: Secondary | ICD-10-CM

## 2023-04-18 DIAGNOSIS — R079 Chest pain, unspecified: Secondary | ICD-10-CM

## 2023-04-18 DIAGNOSIS — R9389 Abnormal findings on diagnostic imaging of other specified body structures: Secondary | ICD-10-CM | POA: Diagnosis not present

## 2023-04-18 DIAGNOSIS — J9611 Chronic respiratory failure with hypoxia: Secondary | ICD-10-CM

## 2023-04-18 DIAGNOSIS — R7989 Other specified abnormal findings of blood chemistry: Secondary | ICD-10-CM

## 2023-04-18 DIAGNOSIS — Z720 Tobacco use: Secondary | ICD-10-CM

## 2023-04-18 DIAGNOSIS — I5032 Chronic diastolic (congestive) heart failure: Secondary | ICD-10-CM

## 2023-04-18 MED ORDER — METOPROLOL TARTRATE 100 MG PO TABS: ORAL_TABLET | ORAL | 0 refills | Status: DC

## 2023-04-18 NOTE — Patient Instructions (Addendum)
 Medication Instructions:  Your Physician recommend you continue on your current medication as directed.    *If you need a refill on your cardiac medications before your next appointment, please call your pharmacy*   Lab Work: Your provider would like for you to have following labs drawn today BMP.   If you have labs (blood work) drawn today and your tests are completely normal, you will receive your results only by: MyChart Message (if you have MyChart) OR A paper copy in the mail If you have any lab test that is abnormal or we need to change your treatment, we will call you to review the results.   Testing/Procedures: Your physician has requested that you have an echocardiogram. Echocardiography is a painless test that uses sound waves to create images of your heart. It provides your doctor with information about the size and shape of your heart and how well your heart's chambers and valves are working.   You may receive an ultrasound enhancing agent through an IV if needed to better visualize your heart during the echo. This procedure takes approximately one hour.  There are no restrictions for this procedure.  This will take place at [address] [facility] [address], [place] [address]  Please note: We ask at that you not bring children with you during ultrasound (echo/ vascular) testing. Due to room size and safety concerns, children are not allowed in the ultrasound rooms during exams. Our front office staff cannot provide observation of children in our lobby area while testing is being conducted. An adult accompanying a patient to their appointment will only be allowed in the ultrasound room at the discretion of the ultrasound technician under special circumstances. We apologize for any inconvenience.  Your physician has requested that you have a carotid duplex. This test is an ultrasound of the carotid arteries in your neck. It looks at blood flow through these arteries  that supply the brain with blood.   Allow one hour for this exam.  There are no restrictions or special instructions.  This will take place at [address] [facility] [address], [place] [address]  Please note: We ask at that you not bring children with you during ultrasound (echo/ vascular) testing. Due to room size and safety concerns, children are not allowed in the ultrasound rooms during exams. Our front office staff cannot provide observation of children in our lobby area while testing is being conducted. An adult accompanying a patient to their appointment will only be allowed in the ultrasound room at the discretion of the ultrasound technician under special circumstances. We apologize for any inconvenience.    Your cardiac CT will be scheduled at one of the below locations:   [facility] [address] [place], [place] [address] [phone]   [facility] please arrive 15 mins early for check-in and test prep.  Please follow these instructions carefully (unless otherwise directed):  An IV will be required for this test and Nitroglycerin  will be given.    On the Night Before the Test: Be sure to Drink plenty of water . Do not consume any caffeinated/decaffeinated beverages or chocolate 12 hours prior to your test. Do not take any antihistamines 12 hours prior to your test.   On the Day of the Test: Drink plenty of water  until 1 hour prior to the test. Do not eat any food 1 hour prior to test. You may take your regular medications prior  to the test.  Take metoprolol  (Lopressor ) two hours prior to test. If you take Furosemide /Hydrochlorothiazide/Spironolactone/Chlorthalidone, please HOLD on the morning of the test. Patients who wear a continuous glucose monitor MUST remove the device prior to scanning. FEMALES- please wear underwire-free bra if available, avoid dresses & tight clothing        After the Test: Drink plenty of water . After receiving IV contrast, you may experience a mild flushed feeling. This is normal. On occasion, you may experience a mild rash up to 24 hours after the test. This is not dangerous. If this occurs, you can take Benadryl 25 mg and increase your fluid intake. If you experience trouble breathing, this can be serious. If it is severe call 911 IMMEDIATELY. If it is mild, please call our office.  We will call to schedule your test 2-4 weeks out understanding that some insurance companies will need an authorization prior to the service being performed.   For more information and frequently asked questions, please visit our website : [email or web address]  For non-scheduling related questions, please contact the cardiac imaging nurse navigator should you have any questions/concerns: Cardiac Imaging Nurse Navigators Direct Office Dial: [phone]   For scheduling needs, including cancellations and rescheduling, please call Brittany, [phone].    Follow-Up: At [facility], you and your health needs are our priority.  As part of our continuing mission to provide you with exceptional heart care, we have created designated Provider Care Teams.  These Care Teams include your primary Cardiologist (physician) and Advanced Practice Providers (APPs -  Physician Assistants and Nurse Practitioners) who all work together to provide you with the care you need, when you need it.  We recommend signing up for the patient portal called MyChart.  Sign up information is provided on this After Visit Summary.  MyChart is used to connect with patients for Virtual Visits (Telemedicine).  Patients are able to view lab/test results, encounter notes, upcoming appointments, etc.  Non-urgent messages can be sent to your provider as well.   To learn more about what you can do with MyChart, go to [email or web address].    Your next appointment:   2  month(s)  Provider:   You may see Redell Cave, MD

## 2023-04-19 LAB — BASIC METABOLIC PANEL
BUN/Creatinine Ratio: 17 (ref 12–28)
BUN: 16 mg/dL (ref 8–27)
CO2: 27 mmol/L (ref 20–29)
Calcium: 9.8 mg/dL (ref 8.7–10.3)
Chloride: 94 mmol/L — ABNORMAL LOW (ref 96–106)
Creatinine, Ser: 0.93 mg/dL (ref 0.57–1.00)
Glucose: 232 mg/dL — ABNORMAL HIGH (ref 70–99)
Potassium: 5.5 mmol/L — ABNORMAL HIGH (ref 3.5–5.2)
Sodium: 139 mmol/L (ref 134–144)
eGFR: 69 mL/min/{1.73_m2} (ref >59)

## 2023-04-20 ENCOUNTER — Other Ambulatory Visit: Payer: Self-pay

## 2023-04-20 DIAGNOSIS — Z79899 Other long term (current) drug therapy: Secondary | ICD-10-CM

## 2023-04-21 ENCOUNTER — Other Ambulatory Visit
Admission: RE | Admit: 2023-04-21 | Discharge: 2023-04-21 | Disposition: A | Discharge status: Home / Self Care | Payer: 59 | Source: Ambulatory Visit | Attending: Physician Assistant | Admitting: Physician Assistant

## 2023-04-21 DIAGNOSIS — Z79899 Other long term (current) drug therapy: Secondary | ICD-10-CM | POA: Diagnosis not present

## 2023-04-21 LAB — BASIC METABOLIC PANEL
Anion gap: 11 (ref 5–15)
BUN: 19 mg/dL (ref 8–23)
CO2: 31 mmol/L (ref 22–32)
Calcium: 9.1 mg/dL (ref 8.9–10.3)
Chloride: 95 mmol/L — ABNORMAL LOW (ref 98–111)
Creatinine, Ser: 0.85 mg/dL (ref 0.44–1.00)
GFR, Estimated: >60 mL/min (ref >60)
Glucose, Bld: 291 mg/dL — ABNORMAL HIGH (ref 70–99)
Potassium: 4.7 mmol/L (ref 3.5–5.1)
Sodium: 137 mmol/L (ref 135–145)

## 2023-04-25 DIAGNOSIS — M4316 Spondylolisthesis, lumbar region: Secondary | ICD-10-CM | POA: Diagnosis not present

## 2023-04-26 ENCOUNTER — Telehealth (HOSPITAL_COMMUNITY): Payer: Self-pay | Admitting: *Deleted

## 2023-04-26 NOTE — Telephone Encounter (Signed)
Reaching out to patient to offer assistance regarding upcoming cardiac imaging study; pt verbalizes understanding of appt date/time, parking situation and where to check in, pre-test NPO status and medications ordered, and verified current allergies; name and call back number provided for further questions should they arise Hayley Sharpe RN Navigator Cardiac Imaging [facility] [phone] office [phone] cell  

## 2023-04-27 ENCOUNTER — Ambulatory Visit
Admission: RE | Admit: 2023-04-27 | Discharge: 2023-04-27 | Disposition: A | Discharge status: Home / Self Care | Payer: 59 | Source: Ambulatory Visit | Attending: Physician Assistant | Admitting: Physician Assistant

## 2023-04-27 DIAGNOSIS — Z87891 Personal history of nicotine dependence: Secondary | ICD-10-CM | POA: Diagnosis not present

## 2023-04-27 DIAGNOSIS — I700 Atherosclerosis of aorta: Secondary | ICD-10-CM | POA: Diagnosis not present

## 2023-04-27 DIAGNOSIS — R079 Chest pain, unspecified: Secondary | ICD-10-CM | POA: Insufficient documentation

## 2023-04-27 DIAGNOSIS — I2510 Atherosclerotic heart disease of native coronary artery without angina pectoris: Secondary | ICD-10-CM | POA: Diagnosis not present

## 2023-04-27 DIAGNOSIS — Z122 Encounter for screening for malignant neoplasm of respiratory organs: Secondary | ICD-10-CM | POA: Diagnosis not present

## 2023-04-27 DIAGNOSIS — F17210 Nicotine dependence, cigarettes, uncomplicated: Secondary | ICD-10-CM | POA: Insufficient documentation

## 2023-04-27 DIAGNOSIS — J439 Emphysema, unspecified: Secondary | ICD-10-CM | POA: Insufficient documentation

## 2023-04-27 DIAGNOSIS — Z96611 Presence of right artificial shoulder joint: Secondary | ICD-10-CM | POA: Insufficient documentation

## 2023-04-27 MED ORDER — NITROGLYCERIN 0.4 MG SL SUBL
0.4000 mg | SUBLINGUAL_TABLET | Freq: Once | SUBLINGUAL | Status: AC
Start: 2023-04-27 — End: 2023-04-27
  Administered 2023-04-27: 0.4 mg via SUBLINGUAL

## 2023-04-27 MED ORDER — SODIUM CHLORIDE 0.9 % IV BOLUS
150.0000 mL | Freq: Once | INTRAVENOUS | Status: AC
  Administered 2023-04-27: 150 mL via INTRAVENOUS

## 2023-04-27 MED ORDER — IOHEXOL 350 MG/ML SOLN
100.0000 mL | Freq: Once | INTRAVENOUS | Status: AC | PRN
  Administered 2023-04-27: 100 mL via INTRAVENOUS

## 2023-04-27 NOTE — Progress Notes (Signed)
Patient tolerated CT well. Drank water after. Vital signs stable encourage to drink water throughout day.Reasons explained and verbalized understanding. Ambulated steady gait.  

## 2023-04-30 NOTE — Patient Instructions (Signed)
Be Involved in Caring For Your Health:  Taking Medications When medications are taken as directed, they can greatly improve your health. But if they are not taken as prescribed, they may not work. In some cases, not taking them correctly can be harmful. To help ensure your treatment remains effective and safe, understand your medications and how to take them. Bring your medications to each visit for review by your provider.  Your lab results, notes, and after visit summary will be available on My Chart. We strongly encourage you to use this feature. If lab results are abnormal the clinic will contact you with the appropriate steps. If the clinic does not contact you assume the results are satisfactory. You can always view your results on My Chart. If you have questions regarding your health or results, please contact the clinic during office hours. You can also ask questions on My Chart.  We at [facility] are grateful that you chose [place] to provide your care. We strive to provide evidence-based and compassionate care and are always looking for feedback. If you get a survey from the clinic please complete this so we can hear your opinions.  Diabetes Mellitus and Foot Care Diabetes, also called diabetes mellitus, may cause problems with your feet and legs because of poor blood flow (circulation). Poor circulation may make your skin: Become thinner and drier. Break more easily. Heal more slowly. Peel and crack. You may also have nerve damage (neuropathy). This can cause decreased feeling in your legs and feet. This means that you may not notice minor injuries to your feet that could lead to more serious problems. Finding and treating problems early is the best way to prevent future foot problems. How to care for your feet Foot hygiene  Wash your feet daily with warm water and mild soap. Do not use hot water. Then, pat your feet and the areas between your toes until they are fully dry. Do  not soak your feet. This can dry your skin. Trim your toenails straight across. Do not dig under them or around the cuticle. File the edges of your nails with an emery board or nail file. Apply a moisturizing lotion or petroleum jelly to the skin on your feet and to dry, brittle toenails. Use lotion that does not contain alcohol and is unscented. Do not apply lotion between your toes. Shoes and socks Wear clean socks or stockings every day. Make sure they are not too tight. Do not wear knee-high stockings. These may decrease blood flow to your legs. Wear shoes that fit well and have enough cushioning. Always look in your shoes before you put them on to be sure there are no objects inside. To break in new shoes, wear them for just a few hours a day. This prevents injuries on your feet. Wounds, scrapes, corns, and calluses  Check your feet daily for blisters, cuts, bruises, sores, and redness. If you cannot see the bottom of your feet, use a mirror or ask someone for help. Do not cut off corns or calluses or try to remove them with medicine. If you find a minor scrape, cut, or break in the skin on your feet, keep it and the skin around it clean and dry. You may clean these areas with mild soap and water. Do not clean the area with peroxide, alcohol, or iodine. If you have a wound, scrape, corn, or callus on your foot, look at it several times a day to make sure it  is healing and not infected. Check for: Redness, swelling, or pain. Fluid or blood. Warmth. Pus or a bad smell. General tips Do not cross your legs. This may decrease blood flow to your feet. Do not use heating pads or hot water bottles on your feet. They may burn your skin. If you have lost feeling in your feet or legs, you may not know this is happening until it is too late. Protect your feet from hot and cold by wearing shoes, such as at the beach or on hot pavement. Schedule a complete foot exam at least once a year or more often if  you have foot problems. Report any cuts, sores, or bruises to your health care provider right away. Where to find more information American Diabetes Association: diabetes.org Association of Diabetes Care & Education Specialists: diabeteseducator.org Contact a health care provider if: You have a condition that increases your risk of infection, and you have any cuts, sores, or bruises on your feet. You have an injury that is not healing. You have redness on your legs or feet. You feel burning or tingling in your legs or feet. You have pain or cramps in your legs and feet. Your legs or feet are numb. Your feet always feel cold. You have pain around any toenails. Get help right away if: You have a wound, scrape, corn, or callus on your foot and: You have signs of infection. You have a fever. You have a red line going up your leg. This information is not intended to replace advice given to you by your health care provider. Make sure you discuss any questions you have with your health care provider. Document Revised: 09/22/2021 Document Reviewed: 09/22/2021 Elsevier Patient Education  2024 [employer].

## 2023-05-01 ENCOUNTER — Other Ambulatory Visit: Payer: Self-pay

## 2023-05-01 DIAGNOSIS — Z122 Encounter for screening for malignant neoplasm of respiratory organs: Secondary | ICD-10-CM

## 2023-05-01 DIAGNOSIS — Z87891 Personal history of nicotine dependence: Secondary | ICD-10-CM

## 2023-05-01 DIAGNOSIS — F17210 Nicotine dependence, cigarettes, uncomplicated: Secondary | ICD-10-CM

## 2023-05-02 ENCOUNTER — Ambulatory Visit: Payer: 59 | Attending: Physician Assistant

## 2023-05-02 ENCOUNTER — Ambulatory Visit (INDEPENDENT_AMBULATORY_CARE_PROVIDER_SITE_OTHER): Payer: 59

## 2023-05-02 DIAGNOSIS — R55 Syncope and collapse: Secondary | ICD-10-CM

## 2023-05-02 LAB — ECHOCARDIOGRAM COMPLETE
AR max vel: 2.59 cm2
AV Area VTI: 2.74 cm2
AV Area mean vel: 2.57 cm2
AV Mean grad: 5 mm[Hg]
AV Peak grad: 10 mm[Hg]
Ao pk vel: 1.58 m/s
Area-P 1/2: 5.13 cm2
S' Lateral: 3.1 cm

## 2023-05-02 MED ORDER — PERFLUTREN LIPID MICROSPHERE
1.0000 mL | INTRAVENOUS | Status: AC | PRN
  Administered 2023-05-02: 2 mL via INTRAVENOUS

## 2023-05-04 ENCOUNTER — Encounter: Payer: Self-pay | Admitting: Nurse Practitioner

## 2023-05-04 ENCOUNTER — Ambulatory Visit (INDEPENDENT_AMBULATORY_CARE_PROVIDER_SITE_OTHER): Payer: 59 | Admitting: Nurse Practitioner

## 2023-05-04 VITALS — BP 102/72 | HR 80 | Temp 98.4°F | Ht 66.9 in | Wt 209.0 lb

## 2023-05-04 DIAGNOSIS — E1169 Type 2 diabetes mellitus with other specified complication: Secondary | ICD-10-CM

## 2023-05-04 DIAGNOSIS — J432 Centrilobular emphysema: Secondary | ICD-10-CM | POA: Diagnosis not present

## 2023-05-04 DIAGNOSIS — E1159 Type 2 diabetes mellitus with other circulatory complications: Secondary | ICD-10-CM | POA: Diagnosis not present

## 2023-05-04 DIAGNOSIS — F411 Generalized anxiety disorder: Secondary | ICD-10-CM

## 2023-05-04 DIAGNOSIS — I739 Peripheral vascular disease, unspecified: Secondary | ICD-10-CM | POA: Diagnosis not present

## 2023-05-04 DIAGNOSIS — J9611 Chronic respiratory failure with hypoxia: Secondary | ICD-10-CM | POA: Diagnosis not present

## 2023-05-04 DIAGNOSIS — K219 Gastro-esophageal reflux disease without esophagitis: Secondary | ICD-10-CM

## 2023-05-04 DIAGNOSIS — M545 Low back pain, unspecified: Secondary | ICD-10-CM

## 2023-05-04 DIAGNOSIS — E1151 Type 2 diabetes mellitus with diabetic peripheral angiopathy without gangrene: Secondary | ICD-10-CM

## 2023-05-04 DIAGNOSIS — E785 Hyperlipidemia, unspecified: Secondary | ICD-10-CM | POA: Diagnosis not present

## 2023-05-04 DIAGNOSIS — I5032 Chronic diastolic (congestive) heart failure: Secondary | ICD-10-CM | POA: Diagnosis not present

## 2023-05-04 DIAGNOSIS — E538 Deficiency of other specified B group vitamins: Secondary | ICD-10-CM

## 2023-05-04 DIAGNOSIS — G8929 Other chronic pain: Secondary | ICD-10-CM

## 2023-05-04 DIAGNOSIS — F17219 Nicotine dependence, cigarettes, with unspecified nicotine-induced disorders: Secondary | ICD-10-CM

## 2023-05-04 DIAGNOSIS — J9612 Chronic respiratory failure with hypercapnia: Secondary | ICD-10-CM | POA: Diagnosis not present

## 2023-05-04 DIAGNOSIS — E559 Vitamin D deficiency, unspecified: Secondary | ICD-10-CM | POA: Diagnosis not present

## 2023-05-04 DIAGNOSIS — E6609 Other obesity due to excess calories: Secondary | ICD-10-CM

## 2023-05-04 DIAGNOSIS — I700 Atherosclerosis of aorta: Secondary | ICD-10-CM | POA: Diagnosis not present

## 2023-05-04 DIAGNOSIS — I152 Hypertension secondary to endocrine disorders: Secondary | ICD-10-CM | POA: Diagnosis not present

## 2023-05-04 DIAGNOSIS — I75023 Atheroembolism of bilateral lower extremities: Secondary | ICD-10-CM

## 2023-05-04 LAB — BAYER DCA HB A1C WAIVED: HB A1C (BAYER DCA - WAIVED): 9.5 % — ABNORMAL HIGH (ref 4.8–5.6)

## 2023-05-04 LAB — MICROALBUMIN, URINE WAIVED
Creatinine, Urine Waived: 200 mg/dL (ref 10–300)
Microalb, Ur Waived: 80 mg/L — ABNORMAL HIGH (ref 0–19)

## 2023-05-04 MED ORDER — VITAMIN B-12 1000 MCG PO TABS: 1000.0000 ug | ORAL_TABLET | Freq: Every day | ORAL | 4 refills | Status: AC

## 2023-05-04 MED ORDER — TRELEGY ELLIPTA 100-62.5-25 MCG/ACT IN AEPB: 1.0000 | INHALATION_SPRAY | Freq: Every day | RESPIRATORY_TRACT | 11 refills | Status: DC

## 2023-05-04 MED ORDER — ROSUVASTATIN CALCIUM 40 MG PO TABS: 40.0000 mg | ORAL_TABLET | Freq: Every day | ORAL | 4 refills | Status: AC

## 2023-05-04 MED ORDER — SITAGLIPTIN PHOSPHATE 100 MG PO TABS: 100.0000 mg | ORAL_TABLET | Freq: Every day | ORAL | 4 refills | Status: AC

## 2023-05-04 MED ORDER — NYSTATIN 100000 UNIT/ML MT SUSP: 5.0000 mL | Freq: Four times a day (QID) | OROMUCOSAL | 0 refills | Status: DC

## 2023-05-04 MED ORDER — PREGABALIN 50 MG PO CAPS: 50.0000 mg | ORAL_CAPSULE | Freq: Two times a day (BID) | ORAL | 2 refills | Status: DC

## 2023-05-04 NOTE — Assessment & Plan Note (Signed)
Chronic with multiple surgeries, last September 2024.  At this time will continue Duloxetine which is offering benefit to mood and pain.  Increase Lyrica to 50 MG BID since is tolerating this medication, did not get benefit from Gabapentin in past.  Educated her on changes and medications + side effects.  Recommend stretching at home daily and movement.  Continue Lidocaine patches.  Recommend a referral to physiatry, she will think about this.

## 2023-05-04 NOTE — Assessment & Plan Note (Signed)
Chronic, ongoing noted on lung CT screening.  Continue statin and ASA daily for prevention.  Continue collaboration with vascular.  Recommend complete cessation smoking.

## 2023-05-04 NOTE — Assessment & Plan Note (Signed)
Chronic, stable, followed by vascular.  Continue collaboration, recent note reviewed.  Continue statin and ASA daily.  Refer to blue toe syndrome plan of care.

## 2023-05-04 NOTE — Assessment & Plan Note (Signed)
Chronic, continue supplement and recheck level today.

## 2023-05-04 NOTE — Assessment & Plan Note (Signed)
Continue to collaborate with pulmonary.  Tolerating O2, continue use of this.

## 2023-05-04 NOTE — Assessment & Plan Note (Signed)
Chronic, ongoing.  Highly recommend she continue Crestor and adjust dosing as needed or add on Zetia if poor control.  Lipid check today.

## 2023-05-04 NOTE — Assessment & Plan Note (Signed)
BMI 32.83 with some weight gain.  Recommended eating smaller high protein, low fat meals more frequently and exercising 30 mins a day 5 times a week with a goal of 10-15lb weight loss in the next 3 months. Patient voiced their understanding and motivation to adhere to these recommendations.

## 2023-05-04 NOTE — Progress Notes (Signed)
BP 102/72 (BP Location: Left Arm, Patient Position: Sitting, Cuff Size: Large)   Pulse 80   Temp 98.4 F (36.9 C) (Oral)   Ht 5' 6.9" (1.699 m)   Wt 209 lb (94.8 kg)   SpO2 96%   BMI 32.83 kg/m    Subjective:    Patient ID: Stacey Gross, female    DOB: 10-Apr-1960, 63 y.o.   MRN: [id number]  HPI: Stacey Gross is a 63 y.o. female  Chief Complaint  Patient presents with   Congestive Heart Failure   COPD   Diabetes   Hyperlipidemia   Hypertension   Depression   Friend, Melinda, at bedside to assist with HPI.  DIABETES A1 5.9% in October.  Took Januvia with tolerance in past, currently not taking any medication due to stable levels for some time.  In past she did not tolerate Metformin at higher doses and did not tolerate GLP1 or SGLT2.    Takes Vitamin D and B12 daily for low levels in past.  Continues to take PPI daily for chronic issues with GERD. Hypoglycemic episodes:no Polydipsia/polyuria: no Visual disturbance: no Chest pain: no Paresthesias: no Glucose Monitoring: yes             Accucheck frequency: daily             Fasting glucose: 216 this morning             Post prandial:             Evening:             Before meals: Taking Insulin?: no             Long acting insulin:             Short acting insulin: Blood Pressure Monitoring: not checking Retinal Examination: Up To Date -- [place] Foot Exam: Up to Date Pneumovax: Up to Date Influenza: Not Up to Date -- refuses  Aspirin: no    HYPERTENSION / HYPERLIPIDEMIA/CHF Continues Olmesartan 5 MG, Verapamil 80 MG daily, Lasix 20 MG PRN, and Crestor 40 MG daily. Saw cardiology on 04/18/23 with no changes.  EF on 05/02/23 was 60-65%.   Vascular visits for her blue toe syndrome/PVD. Saw last 06/17/22, to follow annually. No changes made. Her toes occasionally turn blue and blister, then clear up.  She is concerned about worsening of this R>L foot and would like to have second opinion on treatment  options. Satisfied with current treatment? yes Duration of hypertension: chronic BP monitoring frequency: not checking BP range:  BP medication side effects: no Duration of hyperlipidemia: chronic Cholesterol medication side effects: no Cholesterol supplements: none Medication compliance: good compliance Aspirin: no Recent stressors: no Recurrent headaches: no Visual changes: no Palpitations: if walking 50 feet or more, rapid HR, and occasionally when not doing anything -- following with cardiology and recent Zio done Dyspnea: occasional, using rescue inhaler helps Chest pain: no Lower extremity edema: no Dizzy/lightheaded: if stands up to quickly  COPD Continues Albuterol as needed.  She stopped Breztri due to fungal infections in mouth.  Uses oxygen every day - 3 L.  Continues smoking nicotine, <1 PPD - which is reduction for her.  Not interested in quitting -- has been smoking since age 77.  Lung CT screening 04/27/23 == emphysema and aortic atherosclerosis noted, to continue annually.  COPD status: stable Satisfied with current treatment?: yes Oxygen use: no Dyspnea frequency: occasional, uses rescue inhaler which helps Cough frequency: at  baseline Rescue inhaler frequency: every morning Limitation of activity: no Productive cough: no Last Spirometry: with pulmonary Pneumovax: Up to Date Influenza: Refuses  ANXIETY/STRESS Taking Duloxetine for both pain and mood.    Chronic back pain present -- can not take Tylenol. Dr. Franky Macho performed recent posterior lumbar interbody fusion on 12/08/22 and she did follow-up with him recently, she reports no changes and is to return in 6 months but continues to have pain.  Severe spinal stenosis L4-L5.  Had injections in past with no benefit. Currently is taking Lyrica 25 MG BID along with Cymbalta for pain, but feels Lyrica does not work well.  On average pain is a 7/10.  Does not want to go to a pain clinic.  In past we have used  Wellbutrin, Duloxetine, Prozac, Celexa. Duration:stable Anxious mood: sometimes Excessive worrying: no Irritability: no Sweating: no Nausea: no Palpitations:no Hyperventilation: no Panic attacks: no Agoraphobia: no  Obscessions/compulsions: no Depressed mood: yes, sometimes    May 13, 2023   10:01 AM 02/01/2023    9:53 AM 09/01/2022    9:51 AM 08/02/2022    9:44 AM 05/23/2022   11:06 AM  Depression screen PHQ 2/9  Decreased Interest 0 3 0 2 0  Down, Depressed, Hopeless 0 3 0 2 1  PHQ - 2 Score 0 6 0 4 1  Altered sleeping 0 3 2 2  0  Tired, decreased energy 0 3 1 2  0  Change in appetite 0 3 1 0 0  Feeling bad or failure about yourself  0 0 0 0 0  Trouble concentrating 0 0 0 0 0  Moving slowly or fidgety/restless 0 1 0 0 0  Suicidal thoughts 0 0 0 0 0  PHQ-9 Score 0 16 4 8 1   Difficult doing work/chores Not difficult at all Somewhat difficult Not difficult at all Somewhat difficult Not difficult at all  Anhedonia: no Weight changes: no Insomnia: yes hard to fall asleep -- due to pain Hypersomnia: no Fatigue/loss of energy: no Feelings of worthlessness: no Feelings of guilt: no Impaired concentration/indecisiveness: no Suicidal ideations: no  Crying spells: no Recent Stressors/Life Changes: no   Relationship problems: no   Family stress: no     Financial stress: no    Job stress: no    Recent death/loss: no    May 13, 2023   10:01 AM 02/01/2023    9:54 AM 09/01/2022    9:52 AM 08/02/2022    9:45 AM  GAD 7 : Generalized Anxiety Score  Nervous, Anxious, on Edge 0 1 0 2  Control/stop worrying 0 1 0 2  Worry too much - different things 0 1 1 2   Trouble relaxing 0 0 0 0  Restless 0 0 0 0  Easily annoyed or irritable 0 2 2 2   Afraid - awful might happen 0 0 0 2  Total GAD 7 Score 0 5 3 10   Anxiety Difficulty Not difficult at all Somewhat difficult Not difficult at all Somewhat difficult   Relevant past medical, surgical, family and social history reviewed and updated as  indicated. Interim medical history since our last visit reviewed. Allergies and medications reviewed and updated.  Review of Systems  Constitutional:  Negative for activity change, appetite change, diaphoresis, fatigue and fever.  Respiratory:  Positive for cough (baseline) and shortness of breath (baseline). Negative for chest tightness and wheezing.   Cardiovascular:  Positive for palpitations (occasional). Negative for chest pain and leg swelling.  Gastrointestinal: Negative.   Endocrine: Negative for polydipsia, polyphagia  and polyuria.  Musculoskeletal:  Positive for back pain.  Neurological: Negative.   Psychiatric/Behavioral: Negative.  Negative for sleep disturbance and suicidal ideas.    Per HPI unless specifically indicated above     Objective:    BP 102/72 (BP Location: Left Arm, Patient Position: Sitting, Cuff Size: Large)   Pulse 80   Temp 98.4 F (36.9 C) (Oral)   Ht 5' 6.9" (1.699 m)   Wt 209 lb (94.8 kg)   SpO2 96%   BMI 32.83 kg/m   Wt Readings from Last 3 Encounters:  05/04/23 209 lb (94.8 kg)  04/18/23 201 lb 12.8 oz (91.5 kg)  03/16/23 206 lb 3.2 oz (93.5 kg)    Physical Exam Vitals and nursing note reviewed.  Constitutional:      General: She is awake. She is not in acute distress.    Appearance: She is well-developed and well-groomed. She is obese. She is not ill-appearing or toxic-appearing.  HENT:     Head: Normocephalic.     Right Ear: Hearing and external ear normal.     Left Ear: Hearing and external ear normal.  Eyes:     General: Lids are normal.        Right eye: No discharge.        Left eye: No discharge.     Conjunctiva/sclera: Conjunctivae normal.     Pupils: Pupils are equal, round, and reactive to light.  Neck:     Thyroid: No thyromegaly.     Vascular: No carotid bruit.  Cardiovascular:     Rate and Rhythm: Normal rate and regular rhythm.     Pulses:          Dorsalis pedis pulses are 1+ on the right side and 2+ on the left  side.       Posterior tibial pulses are 1+ on the right side and 2+ on the left side.     Heart sounds: Normal heart sounds. No murmur heard.    No gallop.  Pulmonary:     Effort: Pulmonary effort is normal. No accessory muscle usage or respiratory distress.     Breath sounds: Wheezing present. No decreased breath sounds or rales.     Comments: Intermittent expiratory wheezes per baseline. Abdominal:     General: Bowel sounds are normal. There is no distension.     Palpations: Abdomen is soft.     Tenderness: There is no abdominal tenderness.  Musculoskeletal:     Cervical back: Normal range of motion and neck supple.     Right lower leg: No edema.     Left lower leg: No edema.     Right foot: Normal range of motion.     Left foot: Normal range of motion.  Feet:     Right foot:     Protective Sensation: 10 sites tested.  9 sites sensed.     Skin integrity: Dry skin present.     Toenail Condition: Right toenails are abnormally thick.     Left foot:     Protective Sensation: 10 sites tested.  9 sites sensed.     Skin integrity: Dry skin present.     Toenail Condition: Left toenails are abnormally thick.     Comments: Right foot with purplish-blue toes present to all toes and cold to touch.  Left foot less discoloration of toes and less cold to touch. Lymphadenopathy:     Cervical: No cervical adenopathy.  Skin:    General: Skin is warm and  dry.  Neurological:     Mental Status: She is alert and oriented to person, place, and time.     Deep Tendon Reflexes: Reflexes are normal and symmetric.     Reflex Scores:      Brachioradialis reflexes are 2+ on the right side and 2+ on the left side.      Patellar reflexes are 2+ on the right side and 2+ on the left side. Psychiatric:        Attention and Perception: Attention normal.        Mood and Affect: Mood normal.        Speech: Speech normal.        Behavior: Behavior normal. Behavior is cooperative.        Thought Content:  Thought content normal.    Results for orders placed or performed in visit on 05/04/23  Bayer DCA Hb A1c Waived   Collection Time: 05/04/23  9:42 AM  Result Value Ref Range   HB A1C (BAYER DCA - WAIVED) 9.5 (H) 4.8 - 5.6 %  Microalbumin, Urine Waived   Collection Time: 05/04/23  9:42 AM  Result Value Ref Range   Microalb, Ur Waived 80 (H) 0 - 19 mg/L   Creatinine, Urine Waived 200 10 - 300 mg/dL   Microalb/Creat Ratio 30-300 (H) <30 mg/g      Assessment & Plan:   Problem List Items Addressed This Visit       Cardiovascular and Mediastinum   Aortic atherosclerosis (HCC)   Chronic, ongoing noted on lung CT screening.  Continue statin and ASA daily for prevention.  Continue collaboration with vascular.  Recommend complete cessation smoking.      Relevant Medications   rosuvastatin (CRESTOR) 40 MG tablet   Blue toe syndrome (HCC)   Chronic, ongoing.  Continue collaboration with vascular.  Recent note reviewed. She wishes a second opinion on treatment, referral to [facility] placed per request.      Relevant Medications   rosuvastatin (CRESTOR) 40 MG tablet   Other Relevant Orders   Comprehensive metabolic panel   Lipid Panel w/o Chol/HDL Ratio   Ambulatory referral to Vascular Surgery   Chronic diastolic heart failure (HCC)   Chronic, stable.  Euvolemic today in office.  EF 05/02/23 was 60-65%.  Continue collaboration with cardiology, recent note reviewed.  Continue current medication regimen. - Reminded to call for an overnight weight gain of >2 pounds or a weekly weight gain of >5 pounds - not adding salt to food and read food labels. Reviewed the importance of keeping daily sodium intake to 2000mg  daily. - Avoid NSAIDs      Relevant Medications   rosuvastatin (CRESTOR) 40 MG tablet   Other Relevant Orders   CBC with Differential/Platelet   Comprehensive metabolic panel   Lipid Panel w/o Chol/HDL Ratio   Hypertension associated with diabetes (HCC)   Chronic, stable.  BP  well below goal in office today and at home.  Continue Olmesartan 5 MG daily for kidney protection and Verapamil 80 MG daily, discussed with patient and support system -- recommend they reduce her Lasix to 10 MG daily due to lower BP levels.  Recommend she monitor BP at least a few mornings a week at home and document.  DASH diet at home.  Labs today: CBC, TSH, and CMP.  Return in 3 months.      Relevant Medications   sitaGLIPtin (JANUVIA) 100 MG tablet   rosuvastatin (CRESTOR) 40 MG tablet   Other Relevant Orders  Bayer DCA Hb A1c Waived (Completed)   Microalbumin, Urine Waived (Completed)   CBC with Differential/Platelet   Comprehensive metabolic panel   TSH   PVD (peripheral vascular disease) (HCC)   Chronic, stable, followed by vascular.  Continue collaboration, recent note reviewed.  Continue statin and ASA daily.  Refer to blue toe syndrome plan of care.      Relevant Medications   rosuvastatin (CRESTOR) 40 MG tablet   Other Relevant Orders   Comprehensive metabolic panel   Lipid Panel w/o Chol/HDL Ratio   Type 2 diabetes, controlled, with peripheral circulatory disorder (HCC) - Primary   Chronic, ongoing with A1c trend up to 9.5% today without medication, previous was 5.9%.  She has gained some weight since last visit. Urine ALB 80 January 2025.  Recommend she continue Olmesartan daily for kidney protection. Will restart Januvia at 100 MG daily, as this controlled A1c levels.  In past she did not tolerate Metformin at higher doses and did not tolerate GLP1 or SGLT2.  Check BS BID, bring to visit in 4 weeks.  Continue to collaborate with vascular.  - ARB and statin on board. - Vaccinations up to date. - Foot and eye exam up to date.      Relevant Medications   sitaGLIPtin (JANUVIA) 100 MG tablet   rosuvastatin (CRESTOR) 40 MG tablet   Other Relevant Orders   Bayer DCA Hb A1c Waived (Completed)   Microalbumin, Urine Waived (Completed)     Respiratory   Centrilobular  emphysema (HCC)   Chronic, ongoing.  Will trial change from [facility] to Trelegy to see if less irritation to mouth.  Continue Albuterol as needed.  Recommend complete cessation of smoking.  Continue annual CT scans.  Continue collaboration with pulmonary, recent note reviewed.  Continue to wear oxygen consistently at 3L [place].      Relevant Medications   Fluticasone-Umeclidin-Vilant (TRELEGY ELLIPTA) 100-62.5-25 MCG/ACT AEPB   Other Relevant Orders   CBC with Differential/Platelet   Chronic respiratory failure with hypoxia and hypercapnia (HCC)   Continue to collaborate with pulmonary.  Tolerating O2, continue use of this.      Relevant Orders   CBC with Differential/Platelet     Digestive   GERD (gastroesophageal reflux disease)   Chronic, ongoing, recommend complete cessation of smoking, which would benefit GERD symptoms.  Continue PPI at this time, has not tolerated reductions.  Mag level annually. Risks of PPI use were discussed with patient including bone loss, C. Diff diarrhea, pneumonia, infections, CKD, electrolyte abnormalities.  Verbalizes understanding and chooses to continue the medication.       Relevant Orders   Magnesium     Endocrine   Hyperlipidemia associated with type 2 diabetes mellitus (HCC)   Chronic, ongoing.  Highly recommend she continue Crestor and adjust dosing as needed or add on Zetia if poor control.  Lipid check today.      Relevant Medications   sitaGLIPtin (JANUVIA) 100 MG tablet   rosuvastatin (CRESTOR) 40 MG tablet   Other Relevant Orders   Bayer DCA Hb A1c Waived (Completed)   Comprehensive metabolic panel   Lipid Panel w/o Chol/HDL Ratio     Nervous and Auditory   Nicotine dependence, cigarettes, w unsp disorders   I have recommended complete cessation of tobacco use. I have discussed various options available for assistance with tobacco cessation including over the counter methods (Nicotine gum, patch and lozenges). We also discussed  prescription options (Chantix, Nicotine Inhaler / Nasal Spray). The patient is not  interested in pursuing any prescription tobacco cessation options at this time.  Continue yearly lung screening.         Other   B12 deficiency   Chronic, continue supplement and recheck level today.        Relevant Orders   Vitamin B12   Chronic bilateral low back pain   Chronic with multiple surgeries, last September 2024.  At this time will continue Duloxetine which is offering benefit to mood and pain.  Increase Lyrica to 50 MG BID since is tolerating this medication, did not get benefit from Gabapentin in past.  Educated her on changes and medications + side effects.  Recommend stretching at home daily and movement.  Continue Lidocaine patches.  Recommend a referral to physiatry, she will think about this.      Relevant Medications   pregabalin (LYRICA) 50 MG capsule   Generalized anxiety disorder   Chronic, ongoing. Denies SI/HI.  Will continue Duloxetine 60 MG since is offering benefit to mood and chronic back pain -- she does not wish to add on any other medicine, although scores are elevated.  Educated her on this medication and change + side effects and BLACK BOX warning.        Obesity   BMI 32.83 with some weight gain.  Recommended eating smaller high protein, low fat meals more frequently and exercising 30 mins a day 5 times a week with a goal of 10-15lb weight loss in the next 3 months. Patient voiced their understanding and motivation to adhere to these recommendations.       Relevant Medications   sitaGLIPtin (JANUVIA) 100 MG tablet   Vitamin D deficiency   Chronic.  Noted past labs, continue supplement and recheck level today.      Relevant Orders   VITAMIN D 25 Hydroxy (Vit-D Deficiency, Fractures)      Follow up plan: Return in about 4 weeks (around 06/01/2023) for T2DM, PAIN, BP.

## 2023-05-04 NOTE — Assessment & Plan Note (Signed)
Chronic, ongoing. Denies SI/HI.  Will continue Duloxetine 60 MG since is offering benefit to mood and chronic back pain -- she does not wish to add on any other medicine, although scores are elevated.  Educated her on this medication and change + side effects and BLACK BOX warning.

## 2023-05-04 NOTE — Assessment & Plan Note (Signed)
Chronic.  Noted past labs, continue supplement and recheck level today.

## 2023-05-04 NOTE — Assessment & Plan Note (Signed)
Chronic, stable.  BP well below goal in office today and at home.  Continue Olmesartan 5 MG daily for kidney protection and Verapamil 80 MG daily, discussed with patient and support system -- recommend they reduce her Lasix to 10 MG daily due to lower BP levels.  Recommend she monitor BP at least a few mornings a week at home and document.  DASH diet at home.  Labs today: CBC, TSH, and CMP.  Return in 3 months.

## 2023-05-04 NOTE — Assessment & Plan Note (Signed)
Chronic, stable.  Euvolemic today in office.  EF 05/02/23 was 60-65%.  Continue collaboration with cardiology, recent note reviewed.  Continue current medication regimen. - Reminded to call for an overnight weight gain of >2 pounds or a weekly weight gain of >5 pounds - not adding salt to food and read food labels. Reviewed the importance of keeping daily sodium intake to 2000mg  daily. - Avoid NSAIDs

## 2023-05-04 NOTE — Assessment & Plan Note (Signed)
Chronic, ongoing.  Continue collaboration with vascular.  Recent note reviewed. She wishes a second opinion on treatment, referral to [facility] placed per request.

## 2023-05-04 NOTE — Assessment & Plan Note (Addendum)
Chronic, ongoing with A1c trend up to 9.5% today without medication, previous was 5.9%.  She has gained some weight since last visit. Urine ALB 80 January 2025.  Recommend she continue Olmesartan daily for kidney protection. Will restart Januvia at 100 MG daily, as this controlled A1c levels.  In past she did not tolerate Metformin at higher doses and did not tolerate GLP1 or SGLT2.  Check BS BID, bring to visit in 4 weeks.  Continue to collaborate with vascular.  - ARB and statin on board. - Vaccinations up to date. - Foot and eye exam up to date.

## 2023-05-04 NOTE — Assessment & Plan Note (Signed)
Chronic, ongoing, recommend complete cessation of smoking, which would benefit GERD symptoms.  Continue PPI at this time, has not tolerated reductions.  Mag level annually. Risks of PPI use were discussed with patient including bone loss, C. Diff diarrhea, pneumonia, infections, CKD, electrolyte abnormalities.  Verbalizes understanding and chooses to continue the medication.

## 2023-05-04 NOTE — Assessment & Plan Note (Signed)
Chronic, ongoing.  Will trial change from [facility] to Trelegy to see if less irritation to mouth.  Continue Albuterol as needed.  Recommend complete cessation of smoking.  Continue annual CT scans.  Continue collaboration with pulmonary, recent note reviewed.  Continue to wear oxygen consistently at 3L [place].

## 2023-05-04 NOTE — Assessment & Plan Note (Signed)
I have recommended complete cessation of tobacco use. I have discussed various options available for assistance with tobacco cessation including over the counter methods (Nicotine gum, patch and lozenges). We also discussed prescription options (Chantix, Nicotine Inhaler / Nasal Spray). The patient is not interested in pursuing any prescription tobacco cessation options at this time.  Continue yearly lung screening.

## 2023-05-05 ENCOUNTER — Encounter: Payer: Self-pay | Admitting: Nurse Practitioner

## 2023-05-05 DIAGNOSIS — J9611 Chronic respiratory failure with hypoxia: Secondary | ICD-10-CM | POA: Diagnosis not present

## 2023-05-05 DIAGNOSIS — E042 Nontoxic multinodular goiter: Secondary | ICD-10-CM | POA: Insufficient documentation

## 2023-05-05 DIAGNOSIS — J449 Chronic obstructive pulmonary disease, unspecified: Secondary | ICD-10-CM | POA: Diagnosis not present

## 2023-05-05 DIAGNOSIS — R9389 Abnormal findings on diagnostic imaging of other specified body structures: Secondary | ICD-10-CM | POA: Insufficient documentation

## 2023-05-05 LAB — CBC WITH DIFFERENTIAL/PLATELET
Basophils Absolute: 0.1 10*3/uL (ref 0.0–0.2)
Basos: 1 %
EOS (ABSOLUTE): 0.1 10*3/uL (ref 0.0–0.4)
Eos: 1 %
Hematocrit: 41.1 % (ref 34.0–46.6)
Hemoglobin: 13.3 g/dL (ref 11.1–15.9)
Immature Grans (Abs): 0 10*3/uL (ref 0.0–0.1)
Immature Granulocytes: 0 %
Lymphocytes Absolute: 2 10*3/uL (ref 0.7–3.1)
Lymphs: 24 %
MCH: 28.7 pg (ref 26.6–33.0)
MCHC: 32.4 g/dL (ref 31.5–35.7)
MCV: 89 fL (ref 79–97)
Monocytes Absolute: 0.4 10*3/uL (ref 0.1–0.9)
Monocytes: 5 %
Neutrophils Absolute: 5.6 10*3/uL (ref 1.4–7.0)
Neutrophils: 69 %
Platelets: 253 10*3/uL (ref 150–450)
RBC: 4.63 x10E6/uL (ref 3.77–5.28)
RDW: 13.5 % (ref 11.7–15.4)
WBC: 8.2 10*3/uL (ref 3.4–10.8)

## 2023-05-05 LAB — MAGNESIUM: Magnesium: 1.5 mg/dL — ABNORMAL LOW (ref 1.6–2.3)

## 2023-05-05 LAB — LIPID PANEL W/O CHOL/HDL RATIO
Cholesterol, Total: 161 mg/dL (ref 100–199)
HDL: 42 mg/dL (ref >39)
LDL Chol Calc (NIH): 71 mg/dL (ref 0–99)
Triglycerides: 302 mg/dL — ABNORMAL HIGH (ref 0–149)
VLDL Cholesterol Cal: 48 mg/dL — ABNORMAL HIGH (ref 5–40)

## 2023-05-05 LAB — VITAMIN B12: Vitamin B-12: 1130 pg/mL (ref 232–1245)

## 2023-05-05 LAB — COMPREHENSIVE METABOLIC PANEL
ALT: 9 [IU]/L (ref 0–32)
AST: 15 [IU]/L (ref 0–40)
Albumin: 4.1 g/dL (ref 3.9–4.9)
Alkaline Phosphatase: 112 [IU]/L (ref 44–121)
BUN/Creatinine Ratio: 13 (ref 12–28)
BUN: 12 mg/dL (ref 8–27)
Bilirubin Total: 0.3 mg/dL (ref 0.0–1.2)
CO2: 29 mmol/L (ref 20–29)
Calcium: 9.2 mg/dL (ref 8.7–10.3)
Chloride: 92 mmol/L — ABNORMAL LOW (ref 96–106)
Creatinine, Ser: 0.92 mg/dL (ref 0.57–1.00)
Globulin, Total: 2 g/dL (ref 1.5–4.5)
Glucose: 262 mg/dL — ABNORMAL HIGH (ref 70–99)
Potassium: 4.4 mmol/L (ref 3.5–5.2)
Sodium: 137 mmol/L (ref 134–144)
Total Protein: 6.1 g/dL (ref 6.0–8.5)
eGFR: 70 mL/min/{1.73_m2} (ref >59)

## 2023-05-05 LAB — VITAMIN D 25 HYDROXY (VIT D DEFICIENCY, FRACTURES): Vit D, 25-Hydroxy: 58.8 ng/mL (ref 30.0–100.0)

## 2023-05-05 LAB — TSH: TSH: 1.58 u[IU]/mL (ref 0.450–4.500)

## 2023-05-05 NOTE — Progress Notes (Signed)
Contacted via MyChart   Good morning Stacey Gross, your labs have returned and overall: - Kidney function, creatinine and eGFR, remains normal, as is liver function, AST and ALT.  - Glucose is elevated at expected based on your A1c. - LDL, bad cholesterol, in good range, but triglycerides a little elevated, work on diet changes at home -- less pork skin. - Magnesium a little low, try adding on a magnesium supplement 400 MG daily. At times Protonix causes this.:) Keep being amazing!!  Thank you for allowing me to participate in your care.  I appreciate you. Kindest regards, Jaan Fischel

## 2023-05-08 ENCOUNTER — Telehealth: Payer: Self-pay | Admitting: Nurse Practitioner

## 2023-05-08 NOTE — Telephone Encounter (Signed)
Pt is calling to request order to be sent to [facility] for portable oxygen tank. [facility] telephone number Cb- [phone] Pt Cb- [phone]

## 2023-05-09 ENCOUNTER — Telehealth: Payer: Self-pay | Admitting: Pulmonary Disease

## 2023-05-09 DIAGNOSIS — J449 Chronic obstructive pulmonary disease, unspecified: Secondary | ICD-10-CM

## 2023-05-09 DIAGNOSIS — J9611 Chronic respiratory failure with hypoxia: Secondary | ICD-10-CM

## 2023-05-09 NOTE — Telephone Encounter (Signed)
Please sent order from Dec to [facility]. Thank you!

## 2023-05-09 NOTE — Telephone Encounter (Signed)
OK to place order for the patient?

## 2023-05-09 NOTE — Telephone Encounter (Signed)
 Noted

## 2023-05-09 NOTE — Telephone Encounter (Signed)
Patient needs a prescription to be sent to [facility] for her portable oxygen. Please call for an update once this is completed [phone]

## 2023-05-09 NOTE — Telephone Encounter (Signed)
Patient checking on the status of the below and would like a follow up call once orders have been completed.

## 2023-05-09 NOTE — Telephone Encounter (Signed)
Called and notified patient that order should come from pulmonology.

## 2023-05-10 NOTE — Telephone Encounter (Signed)
 The POC order was sent to [facility] 03/20/23. I will call [employer] to see what is going on

## 2023-05-16 NOTE — Telephone Encounter (Signed)
Okay to send order for oxygen/POC.

## 2023-05-16 NOTE — Telephone Encounter (Signed)
I just spoke with Penni Homans with [employer] and she confirmed the patient has used the required amount of 02 for 3 months. Per Protocol per Penni Homans with [employer] the patient only had 02 at night only until she was admitted to hospital on 03/01/23 and needed 02 2-3 liters to keep 02 about 90%. Dr. Carren Rang office has been asking about POC also. The patient just got tanks delivered to her home on 03/08/23 per [place] and she will need to order 8 tanks for 3 months consistently for [place] to provide a POC per [employer]'s policy  Penni Homans states that if we would place a new 02 order for 3 liters and POC Evaluation then they should be able to get the patient her POC

## 2023-05-16 NOTE — Telephone Encounter (Signed)
Okay to place new O2 order?

## 2023-05-16 NOTE — Telephone Encounter (Signed)
I have placed the order

## 2023-05-22 ENCOUNTER — Telehealth: Payer: Self-pay

## 2023-05-22 ENCOUNTER — Other Ambulatory Visit: Payer: Self-pay

## 2023-05-22 DIAGNOSIS — E1151 Type 2 diabetes mellitus with diabetic peripheral angiopathy without gangrene: Secondary | ICD-10-CM

## 2023-05-22 NOTE — Telephone Encounter (Signed)
 Patient was identified as falling into the [place] Measure - Diabetes.   Patient was: Referred to pharmacy for chronic disease management.

## 2023-05-23 ENCOUNTER — Telehealth: Payer: Self-pay

## 2023-05-23 NOTE — Telephone Encounter (Signed)
 Order placed on 05/16/23 and sent to [facility]. On 05/18/2023 confirmed by Penni Homans that she has the new 02 order

## 2023-05-23 NOTE — Progress Notes (Signed)
 Care Guide Pharmacy Note  05/23/2023 Name: Stacey Gross MRN: [id number] DOB: Aug 11, 1960  Referred By: Marjie Skiff, NP Reason for referral: Care Coordination (Outreach to schedule with Pharm d )   Stacey Gross is a 63 y.o. year old female who is a primary care patient of Marjie Skiff, NP.  [name] was referred to the pharmacist for assistance related to: DMII  An unsuccessful telephone outreach was attempted today to contact the patient who was referred to the pharmacy team for assistance with medication management. Additional attempts will be made to contact the patient.  Penne Lash , RMA     [facility]  [facility] Guide  Direct Dial: [phone]  Website: [email or web address]

## 2023-05-24 ENCOUNTER — Telehealth: Payer: Self-pay

## 2023-05-24 NOTE — Progress Notes (Signed)
 Error

## 2023-05-25 ENCOUNTER — Other Ambulatory Visit: Payer: Self-pay

## 2023-05-25 MED ORDER — FUROSEMIDE 20 MG PO TABS: 20.0000 mg | ORAL_TABLET | Freq: Every day | ORAL | 3 refills | Status: DC | PRN

## 2023-06-02 DIAGNOSIS — J9611 Chronic respiratory failure with hypoxia: Secondary | ICD-10-CM | POA: Diagnosis not present

## 2023-06-02 DIAGNOSIS — J449 Chronic obstructive pulmonary disease, unspecified: Secondary | ICD-10-CM | POA: Diagnosis not present

## 2023-06-04 NOTE — Patient Instructions (Signed)

## 2023-06-07 ENCOUNTER — Ambulatory Visit: Payer: Self-pay | Admitting: Nurse Practitioner

## 2023-06-07 ENCOUNTER — Encounter: Payer: Self-pay | Admitting: Nurse Practitioner

## 2023-06-07 VITALS — BP 108/72 | HR 87 | Temp 98.3°F | Ht 67.0 in | Wt 210.0 lb

## 2023-06-07 DIAGNOSIS — M545 Low back pain, unspecified: Secondary | ICD-10-CM | POA: Diagnosis not present

## 2023-06-07 DIAGNOSIS — J432 Centrilobular emphysema: Secondary | ICD-10-CM

## 2023-06-07 DIAGNOSIS — E1151 Type 2 diabetes mellitus with diabetic peripheral angiopathy without gangrene: Secondary | ICD-10-CM

## 2023-06-07 DIAGNOSIS — I152 Hypertension secondary to endocrine disorders: Secondary | ICD-10-CM

## 2023-06-07 DIAGNOSIS — E1159 Type 2 diabetes mellitus with other circulatory complications: Secondary | ICD-10-CM

## 2023-06-07 DIAGNOSIS — G8929 Other chronic pain: Secondary | ICD-10-CM

## 2023-06-07 MED ORDER — PREGABALIN 50 MG PO CAPS: 50.0000 mg | ORAL_CAPSULE | Freq: Two times a day (BID) | ORAL | 2 refills | Status: DC

## 2023-06-07 MED ORDER — AMOXICILLIN-POT CLAVULANATE 875-125 MG PO TABS: 1.0000 | ORAL_TABLET | Freq: Two times a day (BID) | ORAL | 0 refills | Status: AC

## 2023-06-07 MED ORDER — FLUCONAZOLE 150 MG PO TABS: 150.0000 mg | ORAL_TABLET | Freq: Once | ORAL | 0 refills | Status: AC

## 2023-06-07 MED ORDER — PREDNISONE 20 MG PO TABS: 40.0000 mg | ORAL_TABLET | Freq: Every day | ORAL | 0 refills | Status: AC

## 2023-06-07 NOTE — Assessment & Plan Note (Signed)
 Chronic with multiple surgeries, last September 2024.  At this time will continue Duloxetine which is offering benefit to mood and pain and Lyrica 50 MG BID since is tolerating this medication, did not get benefit from Gabapentin in past.  Educated her on changes and medications + side effects.  Recommend stretching at home daily and movement.  Continue Lidocaine patches.  Recommend a referral to physiatry, she will think about this.

## 2023-06-07 NOTE — Assessment & Plan Note (Signed)
 Chronic, ongoing with A1c trend up to 9.5% recent visit without medication, previous was 5.9%. Januvia is helping now back on board -- sugars trending down.  Urine ALB 80 January 2025.  Recommend she continue Olmesartan daily for kidney protection. In past she did not tolerate Metformin at higher doses and did not tolerate GLP1 or SGLT2.  Check BS BID, bring to visit in 4 weeks.  Continue to collaborate with vascular.  - ARB and statin on board. - Vaccinations up to date. - Foot and eye exam up to date.

## 2023-06-07 NOTE — Assessment & Plan Note (Signed)
 Chronic, ongoing with current exacerbation.  Will continue Trelegy to see if less irritation to mouth.  Continue Albuterol as needed.  Recommend complete cessation of smoking.  Continue annual CT scans.  Continue collaboration with pulmonary, recent note reviewed.  Continue to wear oxygen consistently at 3L [name].  Treat with Augmentin BID for 7 days.  Will send in Diflucan to take only as needed if yeast presents + Prednisone 40 MG daily for 5 days -- only take if abx does not work.

## 2023-06-07 NOTE — Assessment & Plan Note (Signed)
 Chronic, stable.  BP well below goal in office today and at home.  Continue Olmesartan 5 MG daily for kidney protection and Verapamil 80 MG daily + Lasix.  Recommend she monitor BP at least a few mornings a week at home and document.  DASH diet at home.  Labs today: up to date.  Return in 3 months.

## 2023-06-07 NOTE — Progress Notes (Signed)
 BP 108/72 (BP Location: Left Arm, Patient Position: Sitting, Cuff Size: Normal)   Pulse 87   Temp 98.3 F (36.8 C) (Oral)   Ht 5\' 7"  (1.702 m)   Wt 210 lb (95.3 kg)   SpO2 95%   BMI 32.89 kg/m    Subjective:    Patient ID: Stacey Gross, female    DOB: 04-03-1961, 63 y.o.   MRN: [id number]  HPI: SHERESE HEYWARD is a 63 y.o. female  Chief Complaint  Patient presents with   Diabetes   Hypertension   Pain   Friend, [name], at bedside to assist with HPI.  DIABETES Follow-up today, restarted Januvia on 05/04/23 as A1c trended up from 5.9% to 9.5% in January.  This worked well for her in the past.  We also reduced her Lasix to 10 MG daily due to lower BP readings last visit.  Needs to maintain on board, helps edema.  Lyrica was increased to 50 MG BID on 05/04/23 due to chronic back pain post surgery.  In past she did not tolerate Metformin at higher doses and did not tolerate GLP1 or SGLT2.  Hypoglycemic episodes:no Polydipsia/polyuria: no Visual disturbance: no Chest pain: no Paresthesias: no Glucose Monitoring: yes             Accucheck frequency: daily             Fasting glucose: 130-140 average -- improved from 200 range             Post prandial:             Evening:             Before meals: Taking Insulin?: no             Long acting insulin:             Short acting insulin: Blood Pressure Monitoring: not checking Retinal Examination: Up To Date -- [place] Foot Exam: Up to Date Pneumovax: Up to Date Influenza: Not Up to Date -- refuses  Aspirin: no    UPPER RESPIRATORY TRACT INFECTION Underlying COPD -- has not been feeling for 3 days.  Smoking less, 1/2 to 3/4 PPD. Fever: no Cough: yes Shortness of breath: yes Wheezing: yes Chest pain: no Chest tightness: yes Chest congestion: yes Nasal congestion: yes Runny nose: no Post nasal drip: no Sneezing: no Sore throat: no Swollen glands: no Sinus pressure: yes Headache: yes Face pain: no Toothache:  no Ear pain: none Ear pressure: none Eyes red/itching:no Eye drainage/crusting: no  Vomiting: no Rash: no Fatigue: yes Sick contacts: no Strep contacts: no  Context: fluctuating Recurrent sinusitis: no Relief with OTC cold/cough medications: yes  Treatments attempted: mucinex    Relevant past medical, surgical, family and social history reviewed and updated as indicated. Interim medical history since our last visit reviewed. Allergies and medications reviewed and updated.  Review of Systems  Constitutional:  Negative for activity change, appetite change, diaphoresis, fatigue and fever.  HENT:  Positive for congestion and sinus pressure. Negative for ear discharge, ear pain, postnasal drip, rhinorrhea, sinus pain and sore throat.   Respiratory:  Positive for cough and shortness of breath. Negative for chest tightness and wheezing.   Cardiovascular:  Negative for chest pain, palpitations and leg swelling.  Gastrointestinal: Negative.   Endocrine: Negative for polydipsia, polyphagia and polyuria.  Musculoskeletal:  Positive for back pain.  Neurological: Negative.   Psychiatric/Behavioral: Negative.  Negative for sleep disturbance and suicidal ideas.  Per HPI unless specifically indicated above     Objective:    BP 108/72 (BP Location: Left Arm, Patient Position: Sitting, Cuff Size: Normal)   Pulse 87   Temp 98.3 F (36.8 C) (Oral)   Ht 5\' 7"  (1.702 m)   Wt 210 lb (95.3 kg)   SpO2 95%   BMI 32.89 kg/m   Wt Readings from Last 3 Encounters:  06/07/23 210 lb (95.3 kg)  05/04/23 209 lb (94.8 kg)  04/18/23 201 lb 12.8 oz (91.5 kg)    Physical Exam Vitals and nursing note reviewed.  Constitutional:      General: She is awake. She is not in acute distress.    Appearance: She is well-developed and well-groomed. She is obese. She is not ill-appearing or toxic-appearing.  HENT:     Head: Normocephalic.     Right Ear: Hearing, ear canal and external ear normal. A middle ear  effusion is present.     Left Ear: Hearing, ear canal and external ear normal. A middle ear effusion is present.     Nose: Nose normal. No rhinorrhea.     Right Sinus: No maxillary sinus tenderness or frontal sinus tenderness.     Left Sinus: No maxillary sinus tenderness or frontal sinus tenderness.     Mouth/Throat:     Mouth: Mucous membranes are moist.     Pharynx: Oropharynx is clear. Posterior oropharyngeal erythema (mild) present. No pharyngeal swelling or oropharyngeal exudate.  Eyes:     General: Lids are normal.        Right eye: No discharge.        Left eye: No discharge.     Conjunctiva/sclera: Conjunctivae normal.     Pupils: Pupils are equal, round, and reactive to light.  Neck:     Thyroid: No thyromegaly.     Vascular: No carotid bruit.  Cardiovascular:     Rate and Rhythm: Normal rate and regular rhythm.     Pulses:          Dorsalis pedis pulses are 1+ on the right side and 2+ on the left side.       Posterior tibial pulses are 1+ on the right side and 2+ on the left side.     Heart sounds: Normal heart sounds. No murmur heard.    No gallop.  Pulmonary:     Effort: Pulmonary effort is normal. No accessory muscle usage or respiratory distress.     Breath sounds: Wheezing present. No decreased breath sounds or rales.     Comments: Intermittent expiratory wheezes throughout, slightly more than baseline. Abdominal:     General: Bowel sounds are normal. There is no distension.     Palpations: Abdomen is soft.     Tenderness: There is no abdominal tenderness.  Musculoskeletal:     Cervical back: Normal range of motion and neck supple.     Right lower leg: No edema.     Left lower leg: No edema.  Feet:     Comments: Right foot with purplish-blue toes present to all toes and cold to touch.  Left foot less discoloration of toes and less cold to touch. Lymphadenopathy:     Cervical: No cervical adenopathy.  Skin:    General: Skin is warm and dry.  Neurological:      Mental Status: She is alert and oriented to person, place, and time.     Deep Tendon Reflexes: Reflexes are normal and symmetric.     Reflex Scores:  Brachioradialis reflexes are 2+ on the right side and 2+ on the left side.      Patellar reflexes are 2+ on the right side and 2+ on the left side. Psychiatric:        Attention and Perception: Attention normal.        Mood and Affect: Mood normal.        Speech: Speech normal.        Behavior: Behavior normal. Behavior is cooperative.        Thought Content: Thought content normal.    Results for orders placed or performed in visit on 05/04/23  Bayer DCA Hb A1c Waived   Collection Time: 05/04/23  9:42 AM  Result Value Ref Range   HB A1C (BAYER DCA - WAIVED) 9.5 (H) 4.8 - 5.6 %  Microalbumin, Urine Waived   Collection Time: 05/04/23  9:42 AM  Result Value Ref Range   Microalb, Ur Waived 80 (H) 0 - 19 mg/L   Creatinine, Urine Waived 200 10 - 300 mg/dL   Microalb/Creat Ratio 30-300 (H) <30 mg/g  CBC with Differential/Platelet   Collection Time: 05/04/23  9:43 AM  Result Value Ref Range   WBC 8.2 3.4 - 10.8 x10E3/uL   RBC 4.63 3.77 - 5.28 x10E6/uL   Hemoglobin 13.3 11.1 - 15.9 g/dL   Hematocrit 78.2 95.6 - 46.6 %   MCV 89 79 - 97 fL   MCH 28.7 26.6 - 33.0 pg   MCHC 32.4 31.5 - 35.7 g/dL   RDW 21.3 08.6 - 57.8 %   Platelets 253 150 - 450 x10E3/uL   Neutrophils 69 Not Estab. %   Lymphs 24 Not Estab. %   Monocytes 5 Not Estab. %   Eos 1 Not Estab. %   Basos 1 Not Estab. %   Neutrophils Absolute 5.6 1.4 - 7.0 x10E3/uL   Lymphocytes Absolute 2.0 0.7 - 3.1 x10E3/uL   Monocytes Absolute 0.4 0.1 - 0.9 x10E3/uL   EOS (ABSOLUTE) 0.1 0.0 - 0.4 x10E3/uL   Basophils Absolute 0.1 0.0 - 0.2 x10E3/uL   Immature Granulocytes 0 Not Estab. %   Immature Grans (Abs) 0.0 0.0 - 0.1 x10E3/uL  Comprehensive metabolic panel   Collection Time: 05/04/23  9:43 AM  Result Value Ref Range   Glucose 262 (H) 70 - 99 mg/dL   BUN 12 8 - 27 mg/dL    Creatinine, Ser 4.69 0.57 - 1.00 mg/dL   eGFR 70 >62 XB/MWU/1.32   BUN/Creatinine Ratio 13 12 - 28   Sodium 137 134 - 144 mmol/L   Potassium 4.4 3.5 - 5.2 mmol/L   Chloride 92 (L) 96 - 106 mmol/L   CO2 29 20 - 29 mmol/L   Calcium 9.2 8.7 - 10.3 mg/dL   Total Protein 6.1 6.0 - 8.5 g/dL   Albumin 4.1 3.9 - 4.9 g/dL   Globulin, Total 2.0 1.5 - 4.5 g/dL   Bilirubin Total 0.3 0.0 - 1.2 mg/dL   Alkaline Phosphatase 112 44 - 121 IU/L   AST 15 0 - 40 IU/L   ALT 9 0 - 32 IU/L  TSH   Collection Time: 05/04/23  9:43 AM  Result Value Ref Range   TSH 1.580 0.450 - 4.500 uIU/mL  Lipid Panel w/o Chol/HDL Ratio   Collection Time: 05/04/23  9:43 AM  Result Value Ref Range   Cholesterol, Total 161 100 - 199 mg/dL   Triglycerides 440 (H) 0 - 149 mg/dL   HDL 42 >10 mg/dL   VLDL  Cholesterol Cal 48 (H) 5 - 40 mg/dL   LDL Chol Calc (NIH) 71 0 - 99 mg/dL  Magnesium   Collection Time: 05/04/23  9:43 AM  Result Value Ref Range   Magnesium 1.5 (L) 1.6 - 2.3 mg/dL  VITAMIN D 25 Hydroxy (Vit-D Deficiency, Fractures)   Collection Time: 05/04/23  9:43 AM  Result Value Ref Range   Vit D, 25-Hydroxy 58.8 30.0 - 100.0 ng/mL  Vitamin B12   Collection Time: 05/04/23  9:43 AM  Result Value Ref Range   Vitamin B-12 1,130 232 - 1,245 pg/mL      Assessment & Plan:   Problem List Items Addressed This Visit       Cardiovascular and Mediastinum   Hypertension associated with diabetes (HCC)   Chronic, stable.  BP well below goal in office today and at home.  Continue Olmesartan 5 MG daily for kidney protection and Verapamil 80 MG daily + Lasix.  Recommend she monitor BP at least a few mornings a week at home and document.  DASH diet at home.  Labs today: up to date.  Return in 3 months.      Type 2 diabetes, controlled, with peripheral circulatory disorder (HCC)   Chronic, ongoing with A1c trend up to 9.5% recent visit without medication, previous was 5.9%. Januvia is helping now back on board -- sugars  trending down.  Urine ALB 80 January 2025.  Recommend she continue Olmesartan daily for kidney protection. In past she did not tolerate Metformin at higher doses and did not tolerate GLP1 or SGLT2.  Check BS BID, bring to visit in 4 weeks.  Continue to collaborate with vascular.  - ARB and statin on board. - Vaccinations up to date. - Foot and eye exam up to date.        Respiratory   Centrilobular emphysema (HCC) - Primary   Chronic, ongoing with current exacerbation.  Will continue Trelegy to see if less irritation to mouth.  Continue Albuterol as needed.  Recommend complete cessation of smoking.  Continue annual CT scans.  Continue collaboration with pulmonary, recent note reviewed.  Continue to wear oxygen consistently at 3L [place].  Treat with Augmentin BID for 7 days.  Will send in Diflucan to take only as needed if yeast presents + Prednisone 40 MG daily for 5 days -- only take if abx does not work.      Relevant Medications   predniSONE (DELTASONE) 20 MG tablet     Other   Chronic bilateral low back pain   Chronic with multiple surgeries, last September 2024.  At this time will continue Duloxetine which is offering benefit to mood and pain and Lyrica 50 MG BID since is tolerating this medication, did not get benefit from Gabapentin in past.  Educated her on changes and medications + side effects.  Recommend stretching at home daily and movement.  Continue Lidocaine patches.  Recommend a referral to physiatry, she will think about this.      Relevant Medications   predniSONE (DELTASONE) 20 MG tablet   pregabalin (LYRICA) 50 MG capsule    Follow up plan: Return in about 8 weeks (around 08/02/2023) for T2DM, HTN/HLD, PAIN.

## 2023-06-12 NOTE — Progress Notes (Signed)
 Care Guide Pharmacy Note  06/12/2023 Name: Stacey Gross MRN: [id number] DOB: May 26, 1960  Referred By: Marjie Skiff, NP Reason for referral: Care Coordination (Outreach to schedule with Pharm d )   Stacey Gross is a 63 y.o. year old female who is a primary care patient of Marjie Skiff, NP.  Stacey Gross was referred to the pharmacist for assistance related to: DMII  Successful contact was made with the patient to discuss pharmacy services including being ready for the pharmacist to call at least 5 minutes before the scheduled appointment time and to have medication bottles and any blood pressure readings ready for review. The patient agreed to meet with the pharmacist via telephone visit on (date/time).06/21/2023  Penne Lash , RMA     [place]  [facility] Guide  Direct Dial: [phone]  Website: [email or web address]

## 2023-06-13 ENCOUNTER — Telehealth: Payer: Self-pay | Admitting: Nurse Practitioner

## 2023-06-13 NOTE — Telephone Encounter (Signed)
 Copied from CRM [id number]. Topic: Medicare AWV >> Jun 13, 2023  8:11 AM Payton Doughty wrote: Reason for CRM: LVM to r/s AWV due to [facility] out of office today. New AWV date 06/27/23 at 11:20am. Please connfirm date change [facility].  Verlee Rossetti; Care Guide Ambulatory Clinical Support [place] [facility] Direct Dial: [phone]

## 2023-06-15 ENCOUNTER — Ambulatory Visit: Payer: 59 | Admitting: Pulmonary Disease

## 2023-06-15 ENCOUNTER — Ambulatory Visit: Admitting: Pulmonary Disease

## 2023-06-15 ENCOUNTER — Ambulatory Visit
Admission: RE | Admit: 2023-06-15 | Discharge: 2023-06-15 | Disposition: A | Discharge status: Home / Self Care | Attending: Pulmonary Disease | Admitting: Pulmonary Disease

## 2023-06-15 ENCOUNTER — Ambulatory Visit
Admission: RE | Admit: 2023-06-15 | Discharge: 2023-06-15 | Disposition: A | Discharge status: Home / Self Care | Source: Ambulatory Visit | Attending: Pulmonary Disease | Admitting: Pulmonary Disease

## 2023-06-15 ENCOUNTER — Encounter: Payer: Self-pay | Admitting: Pulmonary Disease

## 2023-06-15 VITALS — BP 118/72 | HR 77 | Temp 96.8°F | Ht 67.0 in | Wt 210.8 lb

## 2023-06-15 DIAGNOSIS — J441 Chronic obstructive pulmonary disease with (acute) exacerbation: Secondary | ICD-10-CM

## 2023-06-15 DIAGNOSIS — J449 Chronic obstructive pulmonary disease, unspecified: Secondary | ICD-10-CM | POA: Diagnosis not present

## 2023-06-15 MED ORDER — CEFDINIR 300 MG PO CAPS: 300.0000 mg | ORAL_CAPSULE | Freq: Two times a day (BID) | ORAL | 0 refills | Status: DC

## 2023-06-15 NOTE — Patient Instructions (Signed)
 VISIT SUMMARY:  You were seen today for fatigue and respiratory symptoms, including shortness of breath and a productive cough. You have a history of COPD and have been experiencing these symptoms for about a week and a half. You completed a course of Augmentin but continue to feel unwell. Your oxygen levels have been low, and you have been feeling extremely tired. You have also reduced your smoking significantly.  YOUR PLAN:  -ACUTE BRONCHITIS: Acute bronchitis is an infection of the airways in your lungs, causing symptoms like fatigue, shortness of breath, and a productive cough. You will start a prednisone taper pack and take Omnicef for seven days. A chest x-ray has been ordered to check for possible pneumonia.  -CHRONIC OBSTRUCTIVE PULMONARY DISEASE (COPD): COPD is a chronic lung disease that makes it hard to breathe. You should continue using Trelegy and your nebulizer twice daily. Monitor your oxygen levels and adjust your supplemental oxygen as needed.  -LUNG CANCER: You have a history of lung cancer, but your last screening in January was good. No new actions are needed at this time.  INSTRUCTIONS:  Please schedule a follow-up appointment in three weeks to assess your response to the new antibiotic and prednisone treatment, and to review your chest x-ray results. Call sooner if you experience any problems.

## 2023-06-15 NOTE — Progress Notes (Signed)
 Subjective:    Patient ID: Stacey Gross, female    DOB: 05-08-1960, 63 y.o.   MRN: [id number]  Patient Care Team: Marjie Skiff, NP as PCP - General (Nurse Practitioner) Debbe Odea, MD as PCP - Cardiology (Cardiology) Steele Sizer, MD (Family Medicine) Salena Saner, MD as Consulting Physician (Pulmonary Disease)  Chief Complaint  Patient presents with   Follow-up    Increased SOB. Wheezing. Cough with yellow sputum.     BACKGROUND/INTERVAL: 63 year old current smoker (0.5 PPD, 96 PY) who presents for follow-up on the issue of COPD and shortness of breath. Patient has had issues with increasing shortness of breath since 2022. Her last visit with me was on 16 March 2023.  Recent exacerbation treated by primary care provider with Augmentin, prescribed prednisone but advised to hold until seen by [place].  Never had PFTs ordered in May 2024.  Recent lung cancer screening CT 25 September 2023, lung RADS 2  HPI Discussed the use of AI scribe software for clinical note transcription with the patient, who gave verbal consent to proceed.  History of Present Illness   The patient, with a history of COPD, presents with fatigue and respiratory symptoms.  She has been experiencing shortness of breath and a productive cough with yellow sputum for about a week and a half. She uses Trelegy, which helps with her symptoms, and a nebulizer at home twice a day, including this morning. She completed a course of Augmentin last night. She had a slight fever when her symptoms began but has not been tested for COVID-19. Her oxygen levels reportedly drop somewhat even with oxygen on.  She feels extremely tired, stating 'I've been sleeping my life away' and 'all I want to do is sleep.' This fatigue began approximately two to three weeks ago and has progressively worsened, making it difficult for her to perform daily activities such as grocery shopping.  She was also given prednisone but has not  started it yet. She dislikes prednisone. No issues obtaining Trelegy from the pharmacy.  She smokes ten cigarettes a day, which is a reduction from two packs a day.   DATA 10/29/2020 PFTs: FEV1 1.42 L or 50% predicted, FVC 2.33 L or 64% predicted, FEV1/FVC 61%.  No bronchodilator response.  Air-trapping and hyperinflation noted, diffusion capacity moderately reduced, consistent with moderate COPD with emphysema.   07/14/2021 LDCT: Lung RADS 3 probably benign findings.  No solid pulmonary nodule in the right lower lobe measuring 4.8 mm.  Short-term follow-up in 6 months recommended.  Moderate emphysema noted. 10/20/2021 echocardiogram: LVEF 50 to 55%, grade 2 DD, moderately increased right ventricular wall thickness.  This represented a slight decrease in LVEF and increase in diastolic dysfunction from prior. 11/15/2021 overnight oximetry: Oxygen desaturations to as low as 76%.  Oxygen at 2 L/min nocturnally prescribed. 01/21/2022 LDCT: Lung RADS 2, previously noted lung nodules unchanged.  No nonsolid nodule within the apical segment of the right upper lobe at 5.9 mm. 03/01/2023 CT angio chest: No pulmonary embolism.  Small right pleural effusion with associated compressive atelectatic change in the right lower lobe, no effusion on the left, mass, consolidation or pneumothorax.  No suspicious lung nodules. 04/27/2023 LDCT: Lung RADS 2S, multiple small pulmonary nodules scattered throughout the lungs.  Unchanged from prior.  Mild diffuse bronchial wall thickening and mild centrilobular and paraseptal emphysema  Review of Systems A 10 point review of systems was performed and it is as noted above otherwise negative.   Patient  Active Problem List   Diagnosis Date Noted   Abnormal imaging of thyroid 05/05/2023   CAD (coronary artery disease) 03/01/2023   Prolonged QT interval 03/01/2023   Spondylolisthesis of lumbar region 12/08/2022   Chronic bilateral low back pain 08/02/2022   Chronic diastolic  heart failure (HCC) 78/29/5621   Esophageal dysphagia    Vitamin D deficiency 04/01/2021   Chronic respiratory failure with hypoxia and hypercapnia (HCC) 08/14/2020   PVD (peripheral vascular disease) (HCC) 07/25/2020   B12 deficiency 01/07/2020   Obesity 10/04/2019   Status post total shoulder arthroplasty, right 07/24/2019   Senile purpura (HCC) 06/17/2019   Hepatic steatosis 06/03/2019   Nummular dermatitis 05/28/2019   Centrilobular emphysema (HCC) 05/14/2019   Generalized anxiety disorder 12/17/2018   Type 2 diabetes, controlled, with peripheral circulatory disorder (HCC) 03/21/2018   Hx of colonic polyps    Ectopic gastric tissue    Aortic atherosclerosis (HCC) 04/18/2017   Advanced care planning/counseling discussion 02/22/2017   Blue toe syndrome (HCC) 03/04/2016   GERD (gastroesophageal reflux disease) 12/30/2014   Hyperlipidemia associated with type 2 diabetes mellitus (HCC)    Hypertension associated with diabetes (HCC)    Degenerative joint disease (DJD) of hip    S/P lumbar fusion 07/02/2013   Nicotine dependence, cigarettes, w unsp disorders 02/23/2010    Social History   Tobacco Use   Smoking status: Every Day    Current packs/day: 2.00    Average packs/day: 2.0 packs/day for 48.0 years (96.0 ttl pk-yrs)    Types: Cigarettes   Smokeless tobacco: Never   Tobacco comments:    0.5 PPD khj 06/15/2023  Substance Use Topics   Alcohol use: No    Allergies  Allergen Reactions   Doxycycline Hyclate Anaphylaxis, Swelling and Rash   Acetaminophen Swelling    Tongue swelling and breaks out in welts   Aspirin Swelling    Tongue swelling and breaks out in welts   Latex Rash    Gloves, underwear elastic, tape   Vancomycin Rash    All mycins    Current Meds  Medication Sig   Accu-Chek Softclix Lancets lancets USE TO CHECK BLOOD SUGAR DAILY   albuterol (PROVENTIL) (2.5 MG/3ML) 0.083% nebulizer solution Take 3 mLs (2.5 mg total) by nebulization every 6 (six) hours  as needed for wheezing or shortness of breath.   albuterol (VENTOLIN HFA) 108 (90 Base) MCG/ACT inhaler INHALE 2 PUFFS BY MOUTH INTO THE LUNGS EVERY 6 HOURS AS NEEDED FOR WHEEZING OR SHORTNESS OF BREATH   Blood Glucose Monitoring Suppl (ACCU-CHEK GUIDE ME) w/Device KIT Use to check blood sugars 2-3 times daily with goals = <130 fasting in morning and <180 two hours after eating.  Bring blood sugar log to appointments.   cefdinir (OMNICEF) 300 MG capsule Take 1 capsule (300 mg total) by mouth 2 (two) times daily.   Cholecalciferol 25 MCG (1000 UT) capsule Take 2,000 Units by mouth daily.   cyanocobalamin (VITAMIN B12) 1000 MCG tablet Take 1 tablet (1,000 mcg total) by mouth daily.   cyclobenzaprine (FLEXERIL) 10 MG tablet Take 10 mg by mouth 3 (three) times daily as needed for muscle spasms.   DULoxetine (CYMBALTA) 60 MG capsule Take 1 capsule (60 mg total) by mouth daily.   Fluticasone-Umeclidin-Vilant (TRELEGY ELLIPTA) 100-62.5-25 MCG/ACT AEPB Inhale 1 puff into the lungs daily.   furosemide (LASIX) 20 MG tablet Take 1 tablet (20 mg total) by mouth daily as needed. Please keep appointment on 04/18/2023 for further refills. Thank you.  glucose blood (ACCU-CHEK GUIDE) test strip Use as instructed to check blood sugar daily   hydrOXYzine (VISTARIL) 25 MG capsule Take 1 capsule (25 mg total) by mouth every 8 (eight) hours as needed.   lidocaine (LIDODERM) 5 % Place 1 patch onto the skin daily. Remove & Discard patch within 12 hours or as directed by MD   nystatin (MYCOSTATIN) 100000 UNIT/ML suspension Take 5 mLs (500,000 Units total) by mouth 4 (four) times daily.   olmesartan (BENICAR) 5 MG tablet Take 1 tablet (5 mg total) by mouth daily.   OXYGEN Inhale 3 L into the lungs at bedtime.   pantoprazole (PROTONIX) 40 MG tablet TAKE 1 TABLET BY MOUTH DAILY   pregabalin (LYRICA) 50 MG capsule Take 1 capsule (50 mg total) by mouth 2 (two) times daily.   prochlorperazine (COMPAZINE) 10 MG tablet Take 10 mg  by mouth every 6 (six) hours as needed for vomiting or nausea.   rosuvastatin (CRESTOR) 40 MG tablet Take 1 tablet (40 mg total) by mouth daily.   sitaGLIPtin (JANUVIA) 100 MG tablet Take 1 tablet (100 mg total) by mouth daily.   traZODone (DESYREL) 100 MG tablet Take 1 tablet (100 mg total) by mouth at bedtime as needed. for sleep   verapamil (CALAN) 80 MG tablet Take 1 tablet (80 mg total) by mouth daily.    Immunization History  Administered Date(s) Administered   Influenza,inj,Quad PF,6+ Mos 02/09/2016, 02/22/2017, 05/09/2018, 12/17/2018, 01/07/2020   Pneumococcal Polysaccharide-23 02/09/2016   Td 04/04/2004, 05/13/2014   Zoster Recombinant(Shingrix) 01/07/2019, 04/22/2019        Objective:     BP 118/72 (BP Location: Right Wrist, Cuff Size: Normal)   Pulse 77   Temp (!) 96.8 F (36 C)   Ht 5\' 7"  (1.702 m)   Wt 210 lb 12.8 oz (95.6 kg)   SpO2 92%   BMI 33.02 kg/m   SpO2: 92 % O2 Device: Nasal cannula O2 Flow Rate (L/min): 3 L/min O2 Type: Pulse O2  GENERAL: Well-developed, obese, fully ambulatory, no conversational dyspnea. HEAD: Normocephalic, atraumatic.  EYES: Pupils equal, round, reactive to light. No scleral icterus.  MOUTH: Edentulous upper, poor dentition lower. NECK: Supple. No thyromegaly. Trachea midline. No JVD. No adenopathy. PULMONARY: Distant breath sounds bilaterally.  Rhonchi throughout, no wheezes. CARDIOVASCULAR: S1 and S2. Regular rate and rhythm. No rubs, murmurs or gallops heard. ABDOMEN: Obese otherwise benign MUSCULOSKELETAL: No joint deformity, no clubbing, no edema.  NEUROLOGIC: No overt focal deficit, no gait disturbance, speech is fluent.  No asterixis. SKIN: Intact,warm,dry. PSYCH: Mood and behavior normal.       Assessment & Plan:     ICD-10-CM   1. COPD with acute exacerbation (HCC)  J44.1 DG Chest 2 View      Orders Placed This Encounter  Procedures   DG Chest 2 View    Standing Status:   Future    Expiration Date:    06/14/2024    Reason for Exam (SYMPTOM  OR DIAGNOSIS REQUIRED):   Copd exacerbation, R/O Pneumonia    Preferred imaging location?:   [facility]    Meds ordered this encounter  Medications   cefdinir (OMNICEF) 300 MG capsule    Sig: Take 1 capsule (300 mg total) by mouth 2 (two) times daily.    Dispense:  14 capsule    Refill:  0   Discussion:    Acute bronchitis Presents with symptoms of acute bronchitis, including increased fatigue, shortness of breath, and productive cough with yellow sputum  for the past two to three weeks. Completed a course of Augmentin but continues to experience symptoms. Concern for possible pneumonia. History of smoking may contribute to respiratory symptoms. Prednisone was prescribed but not started due to patient preference. Truman Hayward is chosen as a stronger antibiotic due to previous adverse reactions to doxycycline and potential cardiac arrhythmia risks erythromycin class and quinolone antibiotics. - Start prednisone taper pack as prescribed. - Prescribe Omnicef for seven days. - Order chest x-ray at the hospital, preferably today or within the next few days, rule out pneumonia.  Chronic obstructive pulmonary disease (COPD) COPD, currently using Trelegy and a nebulizer (albuterol) twice daily. Reports that Trelegy helps with symptoms. Oxygen levels have dropped when active, even with supplemental oxygen, contributing to fatigue. Continued smoking may exacerbate symptoms.  Oxygen desaturations not documented today. - Continue Trelegy as prescribed. - Continue nebulizer treatments twice daily. - Monitor oxygen levels and adjust supplemental oxygen as needed.  Lung cancer screening Lung cancer screening in January 2025, lung RADS 2.   Follow-up Follow-up needed to assess response to new antibiotic and prednisone treatment, as well as to review chest x-ray results. - Schedule follow-up appointment in three weeks. - Advise to call sooner if any problems  arise.     Advised if symptoms do not improve or worsen, to please contact office for sooner follow up or seek emergency care.    I spent 40 minutes of dedicated to the care of this patient on the date of this encounter to include pre-visit review of records, face-to-face time with the patient discussing conditions above, post visit ordering of testing, clinical documentation with the electronic health record, making appropriate referrals as documented, and communicating necessary findings to members of the patients care team.     C. Danice Goltz, MD Advanced Bronchoscopy PCCM [facility]    *This note was generated using voice recognition software/Dragon and/or AI transcription program.  Despite best efforts to proofread, errors can occur which can change the meaning. Any transcriptional errors that result from this process are unintentional and may not be fully corrected at the time of dictation.

## 2023-06-16 ENCOUNTER — Ambulatory Visit (INDEPENDENT_AMBULATORY_CARE_PROVIDER_SITE_OTHER): Payer: 59 | Admitting: Vascular Surgery

## 2023-06-16 ENCOUNTER — Encounter (INDEPENDENT_AMBULATORY_CARE_PROVIDER_SITE_OTHER): Payer: 59

## 2023-06-21 ENCOUNTER — Telehealth: Payer: Self-pay | Admitting: Pulmonary Disease

## 2023-06-21 ENCOUNTER — Other Ambulatory Visit: Payer: Self-pay

## 2023-06-21 ENCOUNTER — Ambulatory Visit: Payer: 59 | Admitting: Cardiology

## 2023-06-21 NOTE — Telephone Encounter (Signed)
 I have notified the patient. Nothing further needed.

## 2023-06-21 NOTE — Telephone Encounter (Signed)
 Her x-ray did not show very much difference from her baseline there is an area on the right that may represent a small patch of pneumonia but she is receiving adequate therapy.  This needs to be corroborated by the radiologist who has not read the film yet.

## 2023-06-21 NOTE — Progress Notes (Signed)
 06/21/2023 Name: Stacey Gross MRN: [id number] DOB: 1961/03/30  Chief Complaint  Patient presents with   Diabetes Management Plan   Stacey Gross is a 63 y.o. year old female who presented for a telephone visit.   They were referred to the pharmacist by  care team  for assistance in managing diabetes.   Subjective:  Care Team: Primary Care Provider: Marjie Skiff, NP ; Next Scheduled Visit: 08/03/2023  Medication Access/Adherence  Current Pharmacy:  [facility] - [place], [place] - [address] [place] [address] Phone: [phone] Fax: [phone]  -Patient reports affordability concerns with their medications: No  -Patient reports access/transportation concerns to their pharmacy: No  -Patient reports adherence concerns with their medications:  No    Diabetes: Current medications: Januvia 100 mg daily -Medications tried in the past: Metformin caused significant diarrhea, SGLT2 therapy caused yeast infection, and Ozempic caused stomach upset -Current glucose readings: FBG 104 and 1 to 2-hour postprandial 160 -Using Accu-Chek guide meter; testing to times daily -A1c on 1/30 was elevated at 9.5%-up from 5.9% on 02/01/2023, but patient endorses going without Januvia for a while prior to January A1c -Patient denies hypoglycemic s/sx including dizziness, shakiness, sweating.  -Patient denies hyperglycemic symptoms including polyuria, polydipsia, polyphagia, nocturia, neuropathy, blurred vision. -Patient endorses taking Januvia 100 mg daily as prescribed as well as implementing lifestyle modifications including dietary changes to improve glycemic control  Objective:  Lab Results  Component Value Date   HGBA1C 9.5 (H) 05/04/2023   Lab Results  Component Value Date   CREATININE 0.92 05/04/2023   BUN 12 05/04/2023   NA 137 05/04/2023   K 4.4 05/04/2023   CL 92 (L) 05/04/2023   CO2 29 05/04/2023   Medications Reviewed Today      Reviewed by Lenna Gilford, RPH (Pharmacist) on 06/21/23 at [phone]  Med List Status: <None>   Medication Order Taking? Sig Documenting Provider Last Dose Status Informant  Accu-Chek Softclix Lancets lancets [id number] Yes USE TO CHECK BLOOD SUGAR DAILY Cannady, Jolene T, NP Taking Active Self  albuterol (PROVENTIL) (2.5 MG/3ML) 0.083% nebulizer solution [id number] Yes Take 3 mLs (2.5 mg total) by nebulization every 6 (six) hours as needed for wheezing or shortness of breath. Marjie Skiff, NP Taking Active Self  albuterol (VENTOLIN HFA) 108 (90 Base) MCG/ACT inhaler [id number] Yes INHALE 2 PUFFS BY MOUTH INTO THE LUNGS EVERY 6 HOURS AS NEEDED FOR WHEEZING OR SHORTNESS OF BREATH Cannady, Jolene T, NP Taking Active Self  Blood Glucose Monitoring Suppl (ACCU-CHEK GUIDE ME) w/Device KIT [id number] Yes Use to check blood sugars 2-3 times daily with goals = <130 fasting in morning and <180 two hours after eating.  Bring blood sugar log to appointments. Aura Dials T, NP Taking Active Self  cefdinir (OMNICEF) 300 MG capsule [id number] Yes Take 1 capsule (300 mg total) by mouth 2 (two) times daily. Salena Saner, MD Taking Active   Cholecalciferol 25 MCG (1000 UT) capsule [id number] Yes Take 2,000 Units by mouth daily. [provider] Taking Active Self  cyanocobalamin (VITAMIN B12) 1000 MCG tablet [id number] Yes Take 1 tablet (1,000 mcg total) by mouth daily. Aura Dials T, NP Taking Active   cyclobenzaprine (FLEXERIL) 10 MG tablet [id number]  Take 10 mg by mouth 3 (three) times daily as needed for muscle spasms. [provider]  Active Self  DULoxetine (CYMBALTA) 60 MG capsule [id number] Yes Take 1 capsule (60 mg total) by mouth daily. [place],  Dorie Rank, NP Taking Active Self  Fluticasone-Umeclidin-Vilant (TRELEGY ELLIPTA) 100-62.5-25 MCG/ACT AEPB [id number] Yes Inhale 1 puff into the lungs daily. Aura Dials T, NP Taking Active   furosemide (LASIX) 20 MG tablet [id number]  Yes Take 1 tablet (20 mg total) by mouth daily as needed. Please keep appointment on 04/18/2023 for further refills. Thank you. Debbe Odea, MD Taking Active   glucose blood (ACCU-CHEK GUIDE) test strip [id number] Yes Use as instructed to check blood sugar daily Cannady, Jolene T, NP Taking Active Self  hydrOXYzine (VISTARIL) 25 MG capsule [id number]  Take 1 capsule (25 mg total) by mouth every 8 (eight) hours as needed. Aura Dials T, NP  Active Self  lidocaine (LIDODERM) 5 % [id number]  Place 1 patch onto the skin daily. Remove & Discard patch within 12 hours or as directed by MD Marjie Skiff, NP  Active Self  nystatin (MYCOSTATIN) 100000 UNIT/ML suspension [id number]  Take 5 mLs (500,000 Units total) by mouth 4 (four) times daily. Aura Dials T, NP  Active   olmesartan (BENICAR) 5 MG tablet [id number] Yes Take 1 tablet (5 mg total) by mouth daily. Aura Dials T, NP Taking Active   OXYGEN [id number]  Inhale 3 L into the lungs at bedtime. [provider]  Active Self  pantoprazole (PROTONIX) 40 MG tablet [id number] Yes TAKE 1 TABLET BY MOUTH DAILY Cannady, Jolene T, NP Taking Active   pregabalin (LYRICA) 50 MG capsule [id number] Yes Take 1 capsule (50 mg total) by mouth 2 (two) times daily. Aura Dials T, NP Taking Active   prochlorperazine (COMPAZINE) 10 MG tablet [id number]  Take 10 mg by mouth every 6 (six) hours as needed for vomiting or nausea. [provider]  Active Self  rosuvastatin (CRESTOR) 40 MG tablet [id number] Yes Take 1 tablet (40 mg total) by mouth daily. Aura Dials T, NP Taking Active   sitaGLIPtin (JANUVIA) 100 MG tablet [id number] Yes Take 1 tablet (100 mg total) by mouth daily. Aura Dials T, NP Taking Active   traZODone (DESYREL) 100 MG tablet [id number] Yes Take 1 tablet (100 mg total) by mouth at bedtime as needed. for sleep Aura Dials T, NP Taking Active   verapamil (CALAN) 80 MG tablet [id number] Yes Take 1 tablet (80 mg total) by  mouth daily. Marjie Skiff, NP Taking Active            Assessment/Plan:   Diabetes: -Currently uncontrolled based on most recent A1c; however, recent home blood glucose readings reflect much better control -Reviewed goal A1c, goal fasting, and goal 2 hour post prandial glucose -Reviewed dietary modifications including  -Recommend to continue current regimen at this time as well as twice daily blood glucose monitoring/recording -Patient sees PCP again 5/1 and will be due for A1c.  If A1c >7%, could consider retrial of metformin but with extended release formulation and slow dose titration.  As long as A1c <9% and no ketones present in urine, could also consider SGLT2 retrial with Jardiance 10mg  daily.  Follow Up Plan: Will check-in with patient after May PCP visit  Lenna Gilford, PharmD, DPLA

## 2023-06-21 NOTE — Telephone Encounter (Signed)
 Patient would like to know the results of her x-ray once they become available. She can be reached at [phone]

## 2023-06-22 ENCOUNTER — Encounter: Payer: Self-pay | Admitting: Nurse Practitioner

## 2023-06-22 DIAGNOSIS — E119 Type 2 diabetes mellitus without complications: Secondary | ICD-10-CM | POA: Diagnosis not present

## 2023-06-22 DIAGNOSIS — Z961 Presence of intraocular lens: Secondary | ICD-10-CM | POA: Diagnosis not present

## 2023-06-22 DIAGNOSIS — H353132 Nonexudative age-related macular degeneration, bilateral, intermediate dry stage: Secondary | ICD-10-CM | POA: Diagnosis not present

## 2023-06-22 LAB — HM DIABETES EYE EXAM

## 2023-06-27 ENCOUNTER — Ambulatory Visit: Admitting: Emergency Medicine

## 2023-06-27 VITALS — Ht 67.0 in | Wt 208.0 lb

## 2023-06-27 DIAGNOSIS — Z780 Asymptomatic menopausal state: Secondary | ICD-10-CM

## 2023-06-27 DIAGNOSIS — Z0000 Encounter for general adult medical examination without abnormal findings: Secondary | ICD-10-CM | POA: Diagnosis not present

## 2023-06-27 NOTE — Progress Notes (Signed)
 Subjective:   Stacey Gross is a 63 y.o. who presents for a Medicare Wellness preventive visit.  Visit Complete: Virtual I connected with  Stacey Gross on 06/27/23 by a audio enabled telemedicine application and verified that I am speaking with the correct person using two identifiers.  Patient Location: Home  Provider Location: Office/Clinic  I discussed the limitations of evaluation and management by telemedicine. The patient expressed understanding and agreed to proceed.  Vital Signs: Because this visit was a virtual/telehealth visit, some criteria may be missing or patient reported. Any vitals not documented were not able to be obtained and vitals that have been documented are patient reported.  VideoDeclined- This patient declined Librarian, academic. Therefore the visit was completed with audio only.  Persons Participating in Visit: Patient.  AWV Questionnaire: No: Patient Medicare AWV questionnaire was not completed prior to this visit.  Cardiac Risk Factors include: advanced age (>3men, >55 women);hypertension;diabetes mellitus;dyslipidemia;obesity (BMI >30kg/m2);smoking/ tobacco exposure;Other (see comment), Risk factor comments: CAD     Objective:    Today's Vitals   06/27/23 1113  Weight: 208 lb (94.3 kg)  Height: 5\' 7"  (1.702 m)  PainSc: 9    Body mass index is 32.58 kg/m.     06/27/2023   11:31 AM 03/01/2023   10:52 AM 12/08/2022   10:57 AM 12/01/2022   10:14 AM 05/23/2022   11:09 AM 06/10/2021    8:27 AM 05/17/2021   11:38 AM  Advanced Directives  Does Patient Have a Medical Advance Directive? Yes Yes No No No No Yes  Type of Estate agent of [place];Living will Healthcare Power of [place];Living will     Healthcare Power of Attorney  Does patient want to make changes to medical advance directive? No - Patient declined        Copy of Healthcare Power of Attorney in Chart? No - copy requested      No - copy  requested  Would patient like information on creating a medical advance directive?   No - Patient declined No - Patient declined No - Patient declined No - Patient declined     Current Medications (verified) Outpatient Encounter Medications as of 06/27/2023  Medication Sig   Accu-Chek Softclix Lancets lancets USE TO CHECK BLOOD SUGAR DAILY   albuterol (PROVENTIL) (2.5 MG/3ML) 0.083% nebulizer solution Take 3 mLs (2.5 mg total) by nebulization every 6 (six) hours as needed for wheezing or shortness of breath.   albuterol (VENTOLIN HFA) 108 (90 Base) MCG/ACT inhaler INHALE 2 PUFFS BY MOUTH INTO THE LUNGS EVERY 6 HOURS AS NEEDED FOR WHEEZING OR SHORTNESS OF BREATH   Blood Glucose Monitoring Suppl (ACCU-CHEK GUIDE ME) w/Device KIT Use to check blood sugars 2-3 times daily with goals = <130 fasting in morning and <180 two hours after eating.  Bring blood sugar log to appointments.   Cholecalciferol 25 MCG (1000 UT) capsule Take 2,000 Units by mouth daily.   cyanocobalamin (VITAMIN B12) 1000 MCG tablet Take 1 tablet (1,000 mcg total) by mouth daily.   cyclobenzaprine (FLEXERIL) 10 MG tablet Take 10 mg by mouth 3 (three) times daily as needed for muscle spasms.   DULoxetine (CYMBALTA) 60 MG capsule Take 1 capsule (60 mg total) by mouth daily.   Fluticasone-Umeclidin-Vilant (TRELEGY ELLIPTA) 100-62.5-25 MCG/ACT AEPB Inhale 1 puff into the lungs daily.   furosemide (LASIX) 20 MG tablet Take 1 tablet (20 mg total) by mouth daily as needed. Please keep appointment on 04/18/2023 for further  refills. Thank you.   glucose blood (ACCU-CHEK GUIDE) test strip Use as instructed to check blood sugar daily   hydrOXYzine (VISTARIL) 25 MG capsule Take 1 capsule (25 mg total) by mouth every 8 (eight) hours as needed.   lidocaine (LIDODERM) 5 % Place 1 patch onto the skin daily. Remove & Discard patch within 12 hours or as directed by MD   olmesartan (BENICAR) 5 MG tablet Take 1 tablet (5 mg total) by mouth daily.    OXYGEN Inhale 3 L into the lungs at bedtime.   pantoprazole (PROTONIX) 40 MG tablet TAKE 1 TABLET BY MOUTH DAILY   pregabalin (LYRICA) 50 MG capsule Take 1 capsule (50 mg total) by mouth 2 (two) times daily.   prochlorperazine (COMPAZINE) 10 MG tablet Take 10 mg by mouth every 6 (six) hours as needed for vomiting or nausea.   rosuvastatin (CRESTOR) 40 MG tablet Take 1 tablet (40 mg total) by mouth daily.   sitaGLIPtin (JANUVIA) 100 MG tablet Take 1 tablet (100 mg total) by mouth daily.   traZODone (DESYREL) 100 MG tablet Take 1 tablet (100 mg total) by mouth at bedtime as needed. for sleep   verapamil (CALAN) 80 MG tablet Take 1 tablet (80 mg total) by mouth daily.   cefdinir (OMNICEF) 300 MG capsule Take 1 capsule (300 mg total) by mouth 2 (two) times daily. (Patient not taking: Reported on 06/27/2023)   nystatin (MYCOSTATIN) 100000 UNIT/ML suspension Take 5 mLs (500,000 Units total) by mouth 4 (four) times daily. (Patient not taking: Reported on 06/27/2023)   No facility-administered encounter medications on file as of 06/27/2023.    Allergies (verified) Doxycycline hyclate, Acetaminophen, Aspirin, Latex, and Vancomycin   History: Past Medical History:  Diagnosis Date   (HFpEF) heart failure with preserved ejection fraction (HCC)    Asthma    Cancer (HCC)    stomach   Chronic back pain    COPD (chronic obstructive pulmonary disease) (HCC)    Degenerative joint disease (DJD) of hip    Diabetes mellitus without complication (HCC)    Dyspnea    GERD (gastroesophageal reflux disease)    takes Omeprazole daily   History of bronchitis yrs ago   History of colon polyps    benign   History of staph infection 10 yrs ago   Hyperlipidemia    takes Atorvastatin daily   Hypertension    Palpitations    Peripheral vascular disease (HCC)    Tobacco use    8 cigarettes daily    Weakness    numbness and tingling in both legs    Wears dentures    full upper   Past Surgical History:   Procedure Laterality Date   APPENDECTOMY     BACK SURGERY  9-10 yrs ago   BREAST BIOPSY Right    Core- neg   COLONOSCOPY     COLONOSCOPY WITH PROPOFOL N/A 09/20/2017   Procedure: COLONOSCOPY WITH PROPOFOL;  Surgeon: Pasty Spillers, MD;  Location: [facility] ENDOSCOPY;  Service: Endoscopy;  Laterality: N/A;   COLONOSCOPY WITH PROPOFOL N/A 04/24/2018   Procedure: COLONOSCOPY WITH PROPOFOL;  Surgeon: Midge Minium, MD;  Location: [facility] ENDOSCOPY;  Service: Endoscopy;  Laterality: N/A;   COLONOSCOPY WITH PROPOFOL N/A 06/10/2021   Procedure: COLONOSCOPY WITH BIOPSY;  Surgeon: Midge Minium, MD;  Location: [facility];  Service: Endoscopy;  Laterality: N/A;   ESOPHAGEAL DILATION  06/10/2021   Procedure: ESOPHAGEAL DILATION;  Surgeon: Midge Minium, MD;  Location: [facility];  Service: Endoscopy;;  15-18 mm   ESOPHAGOGASTRODUODENOSCOPY (EGD) WITH PROPOFOL N/A 09/20/2017   Procedure: ESOPHAGOGASTRODUODENOSCOPY (EGD) WITH PROPOFOL;  Surgeon: Pasty Spillers, MD;  Location: [facility] ENDOSCOPY;  Service: Endoscopy;  Laterality: N/A;   ESOPHAGOGASTRODUODENOSCOPY (EGD) WITH PROPOFOL N/A 11/20/2018   Procedure: ESOPHAGOGASTRODUODENOSCOPY (EGD) WITH PROPOFOL;  Surgeon: Midge Minium, MD;  Location: [facility] ENDOSCOPY;  Service: Endoscopy;  Laterality: N/A;   ESOPHAGOGASTRODUODENOSCOPY (EGD) WITH PROPOFOL N/A 06/10/2021   Procedure: ESOPHAGOGASTRODUODENOSCOPY (EGD) WITH BIOPSY;  Surgeon: Midge Minium, MD;  Location: [facility];  Service: Endoscopy;  Laterality: N/A;  Diabetic Latex   EYE SURGERY Bilateral    cataract extraction   PERIPHERAL VASCULAR CATHETERIZATION Left 03/17/2016   Procedure: Lower Extremity Angiography;  Surgeon: Annice Needy, MD;  Location: [facility] INVASIVE CV LAB;  Service: Cardiovascular;  Laterality: Left;   POLYPECTOMY N/A 06/10/2021   Procedure: POLYPECTOMY;  Surgeon: Midge Minium, MD;  Location: [facility];  Service: Endoscopy;  Laterality: N/A;   TOTAL  ABDOMINAL HYSTERECTOMY W/ BILATERAL SALPINGOOPHORECTOMY     total   TOTAL SHOULDER ARTHROPLASTY Right 07/24/2019   Procedure: RIGHT TOTAL SHOULDER ARTHROPLASTY;  Surgeon: Lyndle Herrlich, MD;  Location: [facility] ORS;  Service: Orthopedics;  Laterality: Right;   Family History  Problem Relation Age of Onset   Hyperlipidemia Mother    Hyperlipidemia Father    Melanoma Sister    Breast cancer Neg Hx    Social History   Socioeconomic History   Marital status: Widowed    Spouse name: Not on file   Number of children: 0   Years of education: 8   Highest education level: Not on file  Occupational History   Occupation: disabled  Tobacco Use   Smoking status: Every Day    Current packs/day: 0.50    Average packs/day: 2.0 packs/day for 52.2 years (102.6 ttl pk-yrs)    Types: Cigarettes    Start date: 1973   Smokeless tobacco: Never   Tobacco comments:    0.5 PPD khj 06/15/2023  Vaping Use   Vaping status: Never Used  Substance and Sexual Activity   Alcohol use: No   Drug use: No   Sexual activity: Not on file  Other Topics Concern   Not on file  Social History Narrative   Not on file   Social Drivers of Health   Financial Resource Strain: Low Risk  (06/27/2023)   Overall Financial Resource Strain (CARDIA)    Difficulty of Paying Living Expenses: Not hard at all  Food Insecurity: No Food Insecurity (06/27/2023)   Hunger Vital Sign    Worried About Running Out of Food in the Last Year: Never true    Ran Out of Food in the Last Year: Never true  Transportation Needs: No Transportation Needs (06/27/2023)   PRAPARE - Administrator, Civil Service (Medical): No    Lack of Transportation (Non-Medical): No  Physical Activity: Inactive (06/27/2023)   Exercise Vital Sign    Days of Exercise per Week: 0 days    Minutes of Exercise per Session: 0 min  Stress: No Stress Concern Present (06/27/2023)   [employer] of Occupational Health - Occupational Stress Questionnaire     Feeling of Stress : Only a little  Social Connections: Socially Isolated (06/27/2023)   Social Connection and Isolation Panel [NHANES]    Frequency of Communication with Friends and Family: More than three times a week    Frequency of Social Gatherings with Friends and Family: More than three times a week  Attends Religious Services: Never    Active Member of Clubs or Organizations: No    Attends Banker Meetings: Never    Marital Status: Widowed    Tobacco Counseling Ready to quit: No Counseling given: No Tobacco comments: 0.5 PPD khj 06/15/2023    Clinical Intake:  Pre-visit preparation completed: Yes  Pain : 0-10 Pain Score: 9  Pain Type: Chronic pain Pain Location: Back Pain Descriptors / Indicators: Aching     BMI - recorded: 32.58 Nutritional Status: BMI > 30  Obese Nutritional Risks: Nausea/ vomitting/ diarrhea ("a little bit" has medication) Diabetes: Yes CBG done?: No (FBS 102 per patient) Did pt. bring in CBG monitor from home?: No  Lab Results  Component Value Date   HGBA1C 9.5 (H) 05/04/2023   HGBA1C 5.9 (H) 02/01/2023   HGBA1C 6.0 (H) 11/01/2022     How often do you need to have someone help you when you read instructions, pamphlets, or other written materials from your doctor or pharmacy?: 3 - Sometimes (sister helps) What is the last grade level you completed in school?: 7th  Interpreter Needed?: No  Information entered by :: Tora Kindred, CMA   Activities of Daily Living     06/27/2023   11:18 AM 03/02/2023    3:43 AM  In your present state of health, do you have any difficulty performing the following activities:  Hearing? 0 0  Vision? 0 0  Difficulty concentrating or making decisions? 0 0  Walking or climbing stairs? 1   Comment uses cane or walker prn   Dressing or bathing? 0   Doing errands, shopping? 1 0  Comment doesn't drive right now, sister drives to appointments   Preparing Food and eating ? N   Using the  Toilet? N   In the past six months, have you accidently leaked urine? N   Do you have problems with loss of bowel control? N   Managing your Medications? N   Managing your Finances? N   Housekeeping or managing your Housekeeping? N     Patient Care Team: Marjie Skiff, NP as PCP - General (Nurse Practitioner) Debbe Odea, MD as PCP - Cardiology (Cardiology) Salena Saner, MD as Consulting Physician (Pulmonary Disease) [facility], [facility])  Indicate any recent Medical Services you may have received from other than [facility] providers in the past year (date may be approximate).     Assessment:   This is a routine wellness examination for [place].  Hearing/Vision screen Hearing Screening - Comments:: Denies hearing loss Vision Screening - Comments:: Gets DM eye exams, Dr. Julianne Rice [place]   Goals Addressed             This Visit's Progress    Patient Stated       Try to decrease smoking       Depression Screen     06/27/2023   11:26 AM 06/07/2023   10:11 AM 05/04/2023   10:01 AM 05/04/2023    9:33 AM 03/14/2023    1:19 PM 02/01/2023    9:53 AM 09/01/2022    9:51 AM  PHQ 2/9 Scores  PHQ - 2 Score 4  0   6 0  PHQ- 9 Score 12  0   16 4  Exception Documentation  Patient refusal  Patient refusal Patient refusal      Fall Risk     06/27/2023   11:32 AM 10/05/2022    8:46 AM 05/23/2022   11:09  AM 05/04/2022    8:28 AM 01/31/2022    2:44 PM  Fall Risk   Falls in the past year? 1 0 0 0 0  Number falls in past yr: 1 0 0 0 0  Injury with Fall? 1 0 0 0 0  Risk for fall due to : History of fall(s);Impaired balance/gait;Orthopedic patient;Impaired mobility No Fall Risks No Fall Risks No Fall Risks No Fall Risks  Follow up Falls prevention discussed;Falls evaluation completed;Education provided Falls evaluation completed Falls prevention discussed;Falls evaluation completed Falls evaluation completed Falls evaluation completed    MEDICARE RISK AT  HOME:  Medicare Risk at Home Any stairs in or around the home?: Yes (and has a ramp) If so, are there any without handrails?: No Home free of loose throw rugs in walkways, pet beds, electrical cords, etc?: Yes Adequate lighting in your home to reduce risk of falls?: Yes Life alert?: No Use of a cane, walker or w/c?: Yes (cane or walker prn) Grab bars in the bathroom?: Yes Shower chair or bench in shower?: Yes Elevated toilet seat or a handicapped toilet?: Yes  TIMED UP AND GO:  Was the test performed?  No  Cognitive Function: 6CIT completed        06/27/2023   11:34 AM 05/23/2022   11:17 AM 05/15/2020   11:19 AM 02/01/2018    2:33 PM 01/25/2017    2:21 PM  6CIT Screen  What Year? 0 points 0 points 0 points 0 points 0 points  What month? 0 points 0 points 0 points 0 points 0 points  What time? 0 points 0 points 0 points 0 points 0 points  Count back from 20 0 points 0 points 0 points 0 points 0 points  Months in reverse 0 points 0 points 4 points 0 points 0 points  Repeat phrase 0 points 0 points 6 points 0 points 0 points  Total Score 0 points 0 points 10 points 0 points 0 points    Immunizations Immunization History  Administered Date(s) Administered   Influenza,inj,Quad PF,6+ Mos 02/09/2016, 02/22/2017, 05/09/2018, 12/17/2018, 01/07/2020   Pneumococcal Polysaccharide-23 02/09/2016   Td 04/04/2004, 05/13/2014   Zoster Recombinant(Shingrix) 01/07/2019, 04/22/2019    Screening Tests Health Maintenance  Topic Date Due   DEXA SCAN  Never done   INFLUENZA VACCINE  07/03/2023 (Originally 11/03/2022)   MAMMOGRAM  02/01/2024 (Originally 09/23/2022)   Pneumonia Vaccine 40+ Years old (2 of 2 - PCV) 06/06/2024 (Originally 02/08/2017)   HEMOGLOBIN A1C  11/01/2023   Lung Cancer Screening  04/26/2024   Diabetic kidney evaluation - eGFR measurement  05/03/2024   Diabetic kidney evaluation - Urine ACR  05/03/2024   FOOT EXAM  05/03/2024   DTaP/Tdap/Td (3 - Tdap) 05/13/2024    Colonoscopy  06/10/2024   OPHTHALMOLOGY EXAM  06/21/2024   Medicare Annual Wellness (AWV)  06/26/2024   Hepatitis C Screening  Completed   HIV Screening  Completed   Zoster Vaccines- Shingrix  Completed   HPV VACCINES  Aged Out   COVID-19 Vaccine  Discontinued    Health Maintenance  Health Maintenance Due  Topic Date Due   DEXA SCAN  Never done   Health Maintenance Items Addressed: DEXA ordered, See Nurse Notes  Additional Screening:  Vision Screening: Recommended annual ophthalmology exams for early detection of glaucoma and other disorders of the eye.  Dental Screening: Recommended annual dental exams for proper oral hygiene  Community Resource Referral / Chronic Care Management: CRR required this visit?  No  CCM required this visit?  No     Plan:     I have personally reviewed and noted the following in the patient's chart:   Medical and social history Use of alcohol, tobacco or illicit drugs  Current medications and supplements including opioid prescriptions. Patient is not currently taking opioid prescriptions. Functional ability and status Nutritional status Physical activity Advanced directives List of other physicians Hospitalizations, surgeries, and ER visits in previous 12 months Vitals Screenings to include cognitive, depression, and falls Referrals and appointments  In addition, I have reviewed and discussed with patient certain preventive protocols, quality metrics, and best practice recommendations. A written personalized care plan for preventive services as well as general preventive health recommendations were provided to patient.     Tora Kindred, CMA   06/27/2023   After Visit Summary: (Mail) Due to this being a telephonic visit, the after visit summary with patients personalized plan was offered to patient via mail   Notes:  FBS 102 this morning per patient Placed order for DEXA scan. MMG already ordered. Needs pneumonia  vaccine Declined DM & Nutrition education Declined flu and covid vaccines

## 2023-06-27 NOTE — Patient Instructions (Addendum)
 Stacey Gross , Thank you for taking time to come for your Medicare Wellness Visit. I appreciate your ongoing commitment to your health goals. Please review the following plan we discussed and let me know if I can assist you in the future.   Referrals/Orders/Follow-Ups/Clinician Recommendations: I have placed an order for a bone density test. You may have this done at the same visit as your mammogram. Call [facility] @ [phone] to schedule at your convenience. Recommend getting a pneumonia shot at your next visit.  This is a list of the screening recommended for you and due dates:  Health Maintenance  Topic Date Due   DEXA scan (bone density measurement)  Never done   Flu Shot  07/03/2023*   Mammogram  02/01/2024*   Pneumonia Vaccine (2 of 2 - PCV) 06/06/2024*   Hemoglobin A1C  11/01/2023   Screening for Lung Cancer  04/26/2024   Yearly kidney function blood test for diabetes  05/03/2024   Yearly kidney health urinalysis for diabetes  05/03/2024   Complete foot exam   05/03/2024   DTaP/Tdap/Td vaccine (3 - Tdap) 05/13/2024   Colon Cancer Screening  06/10/2024   Eye exam for diabetics  06/21/2024   Medicare Annual Wellness Visit  06/26/2024   Hepatitis C Screening  Completed   HIV Screening  Completed   Zoster (Shingles) Vaccine  Completed   HPV Vaccine  Aged Out   COVID-19 Vaccine  Discontinued  *Topic was postponed. The date shown is not the original due date.    Advanced directives: (Copy Requested) Please bring a copy of your health care power of attorney and living will to the office to be added to your chart at your convenience. You can mail to [facility] [address] [place], [place] [address] or email to [email or web address]  Next Medicare Annual Wellness Visit scheduled for next year: Yes, 07/09/24 @ 11:20AM (phone visit)  Fall Prevention in the Home, Adult Falls can cause injuries and affect people of all ages. There are many simple  things that you can do to make your home safe and to help prevent falls. If you need it, ask for help making these changes. What actions can I take to prevent falls? General information Use good lighting in all rooms. Make sure to: Replace any light bulbs that burn out. Turn on lights if it is dark and use night-lights. Keep items that you use often in easy-to-reach places. Lower the shelves around your home if needed. Move furniture so that there are clear paths around it. Do not keep throw rugs or other things on the floor that can make you trip. If any of your floors are uneven, fix them. Add color or contrast paint or tape to clearly mark and help you see: Grab bars or handrails. First and last steps of staircases. Where the edge of each step is. If you use a ladder or stepladder: Make sure that it is fully opened. Do not climb a closed ladder. Make sure the sides of the ladder are locked in place. Have someone hold the ladder while you use it. Know where your pets are as you move through your home. What can I do in the bathroom?     Keep the floor dry. Clean up any water that is on the floor right away. Remove soap buildup in the bathtub or shower. Buildup makes bathtubs and showers slippery. Use non-skid mats or decals on the floor of the bathtub or shower.  Attach bath mats securely with double-sided, non-slip rug tape. If you need to sit down while you are in the shower, use a non-slip stool. Install grab bars by the toilet and in the bathtub and shower. Do not use towel bars as grab bars. What can I do in the bedroom? Make sure that you have a light by your bed that is easy to reach. Do not use any sheets or blankets on your bed that hang to the floor. Have a firm bench or chair with side arms that you can use for support when you get dressed. What can I do in the kitchen? Clean up any spills right away. If you need to reach something above you, use a sturdy step stool  that has a grab bar. Keep electrical cables out of the way. Do not use floor polish or wax that makes floors slippery. What can I do with my stairs? Do not leave anything on the stairs. Make sure that you have a light switch at the top and the bottom of the stairs. Have them installed if you do not have them. Make sure that there are handrails on both sides of the stairs. Fix handrails that are broken or loose. Make sure that handrails are as long as the staircases. Install non-slip stair treads on all stairs in your home if they do not have carpet. Avoid having throw rugs at the top or bottom of stairs, or secure the rugs with carpet tape to prevent them from moving. Choose a carpet design that does not hide the edge of steps on the stairs. Make sure that carpet is firmly attached to the stairs. Fix any carpet that is loose or worn. What can I do on the outside of my home? Use bright outdoor lighting. Repair the edges of walkways and driveways and fix any cracks. Clear paths of anything that can make you trip, such as tools or rocks. Add color or contrast paint or tape to clearly mark and help you see high doorway thresholds. Trim any bushes or trees on the main path into your home. Check that handrails are securely fastened and in good repair. Both sides of all steps should have handrails. Install guardrails along the edges of any raised decks or porches. Have leaves, snow, and ice cleared regularly. Use sand, salt, or ice melt on walkways during winter months if you live where there is ice and snow. In the garage, clean up any spills right away, including grease or oil spills. What other actions can I take? Review your medicines with your health care provider. Some medicines can make you confused or feel dizzy. This can increase your chance of falling. Wear closed-toe shoes that fit well and support your feet. Wear shoes that have rubber soles and low heels. Use a cane, walker, scooter, or  crutches that help you move around if needed. Talk with your provider about other ways that you can decrease your risk of falls. This may include seeing a physical therapist to learn to do exercises to improve movement and strength. Where to find more information Centers for Disease Control and Prevention, [employer]: TonerPromos.no [employer] on Aging: BaseRingTones.pl National Institute on Aging: BaseRingTones.pl Contact a health care provider if: You are afraid of falling at home. You feel weak, drowsy, or dizzy at home. You fall at home. Get help right away if you: Lose consciousness or have trouble moving after a fall. Have a fall that causes a head injury. These symptoms may  be an emergency. Get help right away. Call 911. Do not wait to see if the symptoms will go away. Do not drive yourself to the hospital. This information is not intended to replace advice given to you by your health care provider. Make sure you discuss any questions you have with your health care provider. Document Revised: 11/22/2021 Document Reviewed: 11/22/2021 Elsevier Patient Education  2024 [employer].

## 2023-07-03 DIAGNOSIS — J9611 Chronic respiratory failure with hypoxia: Secondary | ICD-10-CM | POA: Diagnosis not present

## 2023-07-03 DIAGNOSIS — J449 Chronic obstructive pulmonary disease, unspecified: Secondary | ICD-10-CM | POA: Diagnosis not present

## 2023-07-07 ENCOUNTER — Ambulatory Visit: Admitting: Pulmonary Disease

## 2023-07-12 DIAGNOSIS — E1159 Type 2 diabetes mellitus with other circulatory complications: Secondary | ICD-10-CM | POA: Diagnosis not present

## 2023-07-12 DIAGNOSIS — I70203 Unspecified atherosclerosis of native arteries of extremities, bilateral legs: Secondary | ICD-10-CM | POA: Diagnosis not present

## 2023-07-12 DIAGNOSIS — I75029 Atheroembolism of unspecified lower extremity: Secondary | ICD-10-CM | POA: Diagnosis not present

## 2023-07-12 DIAGNOSIS — I10 Essential (primary) hypertension: Secondary | ICD-10-CM | POA: Diagnosis not present

## 2023-07-12 DIAGNOSIS — E785 Hyperlipidemia, unspecified: Secondary | ICD-10-CM | POA: Diagnosis not present

## 2023-07-12 DIAGNOSIS — J449 Chronic obstructive pulmonary disease, unspecified: Secondary | ICD-10-CM | POA: Diagnosis not present

## 2023-07-12 DIAGNOSIS — R208 Other disturbances of skin sensation: Secondary | ICD-10-CM | POA: Diagnosis not present

## 2023-07-12 DIAGNOSIS — R238 Other skin changes: Secondary | ICD-10-CM | POA: Diagnosis not present

## 2023-07-12 DIAGNOSIS — Z9981 Dependence on supplemental oxygen: Secondary | ICD-10-CM | POA: Diagnosis not present

## 2023-07-30 NOTE — Patient Instructions (Addendum)
 Start out taking Tresiba  10 units nightly, if by Monday sugars in morning are >130, increase insulin  by 3 units (13 units).  Continue to increase by 3 units every 3 days if morning sugar >130.  Goal is to lower blood sugar but not get you too low, <70.    Be Involved in Caring For Your Health:  Taking Medications When medications are taken as directed, they can greatly improve your health. But if they are not taken as prescribed, they may not work. In some cases, not taking them correctly can be harmful. To help ensure your treatment remains effective and safe, understand your medications and how to take them. Bring your medications to each visit for review by your provider.  Your lab results, notes, and after visit summary will be available on My Chart. We strongly encourage you to use this feature. If lab results are abnormal the clinic will contact you with the appropriate steps. If the clinic does not contact you assume the results are satisfactory. You can always view your results on My Chart. If you have questions regarding your health or results, please contact the clinic during office hours. You can also ask questions on My Chart.  We at [facility] are grateful that you chose us  to provide your care. We strive to provide evidence-based and compassionate care and are always looking for feedback. If you get a survey from the clinic please complete this so we can hear your opinions.  Diabetes Mellitus and Exercise Regular exercise is important for your health, especially if you have diabetes mellitus. Exercise is not just about losing weight. It can also help you increase muscle strength and bone density and reduce body fat and stress. This can help your level of endurance and make you more fit and flexible. Why should I exercise if I have diabetes? Exercise has many benefits for people with diabetes. It can: Help lower and control your blood sugar (glucose). Help your body respond  better and become more sensitive to the hormone insulin . Reduce how much insulin  your body needs. Lower your risk for heart disease by: Lowering how much "bad" cholesterol and triglycerides you have in your body. Increasing how much "good" cholesterol you have in your body. Lowering your blood pressure. Lowering your blood glucose levels. What is my activity plan? Your health care provider or an expert trained in diabetes care (certified diabetes educator) can help you make an activity plan. This plan can help you find the type of exercise that works for you. It may also tell you how often to exercise and for how long. Be sure to: Get at least 150 minutes of medium-intensity or high-intensity exercise each week. This may involve brisk walking, biking, or water  aerobics. Do stretching and strengthening exercises at least 2 times a week. This may involve yoga or weight lifting. Spread out your activity over at least 3 days of the week. Get some form of physical activity each day. Do not go more than 2 days in a row without some kind of activity. Avoid being inactive for more than 30 minutes at a time. Take frequent breaks to walk or stretch. Choose activities that you enjoy. Set goals that you know you can accomplish. Start slowly and increase the intensity of your exercise over time. How do I manage my diabetes during exercise?  Monitor your blood glucose Check your blood glucose before and after you exercise. If your blood glucose is 240 mg/dL (44.0 mmol/L) or higher  before you exercise, check your urine for ketones. These are chemicals created by the liver. If you have ketones in your urine, do not exercise until your blood glucose returns to normal. If your blood glucose is 100 mg/dL (5.6 mmol/L) or lower, eat a snack that has 15-20 grams of carbohydrate in it. Check your blood glucose 15 minutes after the snack to make sure that your level is above 100 mg/dL (5.6 mmol/L) before you start to  exercise. Your risk for low blood glucose (hypoglycemia) goes up during and after exercise. Know the symptoms of this condition and how to treat it. Follow these instructions at home: Keep a carbohydrate snack on hand for use before, during, and after exercise. This can help prevent or treat hypoglycemia. Avoid injecting insulin  into parts of your body that are going to be used during exercise. This may include: Your arms, when you are going to play tennis. Your legs, when you are about to go jogging. Keep track of your exercise habits. This can help you and your health care provider watch and adjust your activity plan. Write down: What you eat before and after you exercise. Blood glucose levels before and after you exercise. The type and amount of exercise you do. Talk to your health care provider before you start a new activity. They may need to: Make sure that the activity is safe for you. Adjust your insulin , other medicines, and food that you eat. Drink water  while you exercise. This can stop you from losing too much water  (dehydration). It can also prevent problems caused by having a lot of heat in your body (heat stroke). Where to find more information American Diabetes Association: diabetes.org Association of Diabetes Care & Education Specialists: diabeteseducator.org This information is not intended to replace advice given to you by your health care provider. Make sure you discuss any questions you have with your health care provider. Document Revised: 09/08/2021 Document Reviewed: 09/08/2021 Elsevier Patient Education  2024 [employer].

## 2023-08-02 ENCOUNTER — Ambulatory Visit
Admission: RE | Admit: 2023-08-02 | Discharge: 2023-08-02 | Disposition: A | Discharge status: Home / Self Care | Source: Ambulatory Visit | Attending: Nurse Practitioner | Admitting: Nurse Practitioner

## 2023-08-02 ENCOUNTER — Other Ambulatory Visit: Payer: Self-pay | Admitting: Nurse Practitioner

## 2023-08-02 DIAGNOSIS — Z780 Asymptomatic menopausal state: Secondary | ICD-10-CM | POA: Diagnosis not present

## 2023-08-02 DIAGNOSIS — J9611 Chronic respiratory failure with hypoxia: Secondary | ICD-10-CM | POA: Diagnosis not present

## 2023-08-02 DIAGNOSIS — Z1231 Encounter for screening mammogram for malignant neoplasm of breast: Secondary | ICD-10-CM | POA: Insufficient documentation

## 2023-08-02 DIAGNOSIS — J449 Chronic obstructive pulmonary disease, unspecified: Secondary | ICD-10-CM | POA: Diagnosis not present

## 2023-08-03 ENCOUNTER — Encounter: Payer: Self-pay | Admitting: Nurse Practitioner

## 2023-08-03 ENCOUNTER — Ambulatory Visit: Admitting: Nurse Practitioner

## 2023-08-03 VITALS — BP 113/69 | HR 80 | Temp 97.7°F | Ht 67.0 in | Wt 215.0 lb

## 2023-08-03 DIAGNOSIS — E1159 Type 2 diabetes mellitus with other circulatory complications: Secondary | ICD-10-CM | POA: Diagnosis not present

## 2023-08-03 DIAGNOSIS — E6609 Other obesity due to excess calories: Secondary | ICD-10-CM

## 2023-08-03 DIAGNOSIS — I700 Atherosclerosis of aorta: Secondary | ICD-10-CM | POA: Diagnosis not present

## 2023-08-03 DIAGNOSIS — I5032 Chronic diastolic (congestive) heart failure: Secondary | ICD-10-CM

## 2023-08-03 DIAGNOSIS — I75023 Atheroembolism of bilateral lower extremities: Secondary | ICD-10-CM | POA: Diagnosis not present

## 2023-08-03 DIAGNOSIS — I152 Hypertension secondary to endocrine disorders: Secondary | ICD-10-CM | POA: Diagnosis not present

## 2023-08-03 DIAGNOSIS — J432 Centrilobular emphysema: Secondary | ICD-10-CM | POA: Diagnosis not present

## 2023-08-03 DIAGNOSIS — E1169 Type 2 diabetes mellitus with other specified complication: Secondary | ICD-10-CM

## 2023-08-03 DIAGNOSIS — J9611 Chronic respiratory failure with hypoxia: Secondary | ICD-10-CM | POA: Diagnosis not present

## 2023-08-03 DIAGNOSIS — F411 Generalized anxiety disorder: Secondary | ICD-10-CM

## 2023-08-03 DIAGNOSIS — E1151 Type 2 diabetes mellitus with diabetic peripheral angiopathy without gangrene: Secondary | ICD-10-CM

## 2023-08-03 DIAGNOSIS — F17219 Nicotine dependence, cigarettes, with unspecified nicotine-induced disorders: Secondary | ICD-10-CM

## 2023-08-03 DIAGNOSIS — E66811 Obesity, class 1: Secondary | ICD-10-CM

## 2023-08-03 DIAGNOSIS — E785 Hyperlipidemia, unspecified: Secondary | ICD-10-CM | POA: Diagnosis not present

## 2023-08-03 DIAGNOSIS — I739 Peripheral vascular disease, unspecified: Secondary | ICD-10-CM

## 2023-08-03 DIAGNOSIS — J9612 Chronic respiratory failure with hypercapnia: Secondary | ICD-10-CM

## 2023-08-03 LAB — BAYER DCA HB A1C WAIVED: HB A1C (BAYER DCA - WAIVED): 8.6 % — ABNORMAL HIGH (ref 4.8–5.6)

## 2023-08-03 MED ORDER — AMOXICILLIN-POT CLAVULANATE 875-125 MG PO TABS: 1.0000 | ORAL_TABLET | Freq: Two times a day (BID) | ORAL | 0 refills | Status: AC

## 2023-08-03 MED ORDER — ALBUTEROL SULFATE HFA 108 (90 BASE) MCG/ACT IN AERS: INHALATION_SPRAY | RESPIRATORY_TRACT | 3 refills | Status: AC

## 2023-08-03 MED ORDER — BEVESPI AEROSPHERE 9-4.8 MCG/ACT IN AERO: 2.0000 | INHALATION_SPRAY | Freq: Every day | RESPIRATORY_TRACT | 11 refills | Status: AC

## 2023-08-03 MED ORDER — INSULIN DEGLUDEC 100 UNIT/ML ~~LOC~~ SOPN: 10.0000 [IU] | PEN_INJECTOR | Freq: Every day | SUBCUTANEOUS | 12 refills | Status: DC

## 2023-08-03 NOTE — Assessment & Plan Note (Signed)
BMI 33.67.  Recommended eating smaller high protein, low fat meals more frequently and exercising 30 mins a day 5 times a week with a goal of 10-15lb weight loss in the next 3 months. Patient voiced their understanding and motivation to adhere to these recommendations.  

## 2023-08-03 NOTE — Assessment & Plan Note (Signed)
 Chronic, ongoing. Denies SI/HI.  Will continue Duloxetine 60 MG since is offering benefit to mood and chronic back pain -- she does not wish to add on any other medicine, although scores are elevated.  Educated her on this medication and change + side effects and BLACK BOX warning.

## 2023-08-03 NOTE — Assessment & Plan Note (Signed)
 Continue to collaborate with pulmonary.  Tolerating O2, continue use of this.

## 2023-08-03 NOTE — Assessment & Plan Note (Signed)
 Chronic, ongoing.  Highly recommend she continue Crestor and adjust dosing as needed or add on Zetia if poor control.  Lipid check today.

## 2023-08-03 NOTE — Assessment & Plan Note (Signed)
 Chronic, ongoing with A1c slowly trending down to 8.6% today.  Urine ALB 80 January 2025.  Recommend she continue Olmesartan  daily for kidney protection. In past she did not tolerate Metformin  at higher doses and did not tolerate GLP1 or SGLT2.  Check BS BID, bring to visit in 4 weeks.  Continue to collaborate with vascular.  - Discussed at length treatment options with her.  Will maintain Januvia  on board and start Tresiba  10 units nightly with plan to increase dependent on sugars.  Educated at length on this regimen and use.  She has had poor tolerance of multiple medications and concern that a sulfonylurea would cause hypoglycemia and falls.  - ARB and statin on board. - Vaccinations up to date. - Foot and eye exam up to date.

## 2023-08-03 NOTE — Assessment & Plan Note (Signed)
 Chronic, stable.  Euvolemic today in office.  EF 05/02/23 was 60-65%.  Continue collaboration with cardiology, recent note reviewed.  Continue current medication regimen. - Reminded to call for an overnight weight gain of >2 pounds or a weekly weight gain of >5 pounds - not adding salt to food and read food labels. Reviewed the importance of keeping daily sodium intake to 2000mg  daily. - Avoid NSAIDs

## 2023-08-03 NOTE — Progress Notes (Signed)
 BP 113/69   Pulse 80   Temp 97.7 F (36.5 C) (Oral)   Ht 5\' 7"  (1.702 m)   Wt 215 lb (97.5 kg)   SpO2 95%   BMI 33.67 kg/m    Subjective:    Patient ID: Stacey Gross, female    DOB: Jun 17, 1960, 63 y.o.   MRN: [id number]  HPI: RUPERTA Gross is a 63 y.o. female  Chief Complaint  Patient presents with   Diabetes   Hypertension   Hyperlipidemia   COPD   Friend, Melinda, at bedside to assist with HPI.  DIABETES A1c in January was 9.5%, at which time we restarted Januvia . Metformin  caused increase in bowel movements and could not leave house. GLP1 medications caused major stomach issues and Farxiga  caused yeast infections.  Hypoglycemic episodes:no Polydipsia/polyuria: no Visual disturbance: no Chest pain: no Paresthesias: no Glucose Monitoring: yes             Accucheck frequency: daily             Fasting glucose: 178 this morning             Post prandial:             Evening:             Before meals: Taking Insulin ?: no             Long acting insulin :             Short acting insulin : Blood Pressure Monitoring: not checking Retinal Examination: Up To Date -- [place] Foot Exam: Up to Date Pneumovax: Up to Date Influenza: Not Up to Date -- refuses  Aspirin : no    HYPERTENSION / HYPERLIPIDEMIA/CHF Taking Olmesartan  5 MG, Verapamil  80 MG daily, Lasix  20 MG PRN, and Crestor  40 MG daily. Cardiology last seen 04/18/23 with no changes. EF 05/02/23 was 60-65%.   Vascular visits for her blue toe syndrome/PVD. Saw vascular [facility] for second opinion on 07/12/23 with no changes and there is nothing they can do for her syndrome. Her toes occasionally turn blue and blister, then clear up.   Satisfied with current treatment? yes Duration of hypertension: chronic BP monitoring frequency: not checking BP range:  BP medication side effects: no Duration of hyperlipidemia: chronic Cholesterol medication side effects: no Cholesterol supplements: none Medication compliance: good  compliance Aspirin : no Recent stressors: no Recurrent headaches: no Visual changes: no Palpitations: occasional Dyspnea: occasional, using rescue inhaler helps Chest pain: no Lower extremity edema: no Dizzy/lightheaded: if stands up too quickly  COPD Continues Albuterol  as needed. No current daily inhaler as Breztri  and triple therapy caused fungal issues. She is not using Trelegy as is powder and could not tolerate.  Saw pulmonary on 06/15/23 and they treated for exacerbation, she reports that she is still not feeling 100%.  Uses oxygen  every day - 3 L.  Continues smoking nicotine , 3/4 to 1/2 PPD.  Not interested in quitting -- has been smoking since age 65.  Lung CT screening 04/27/23 == emphysema and aortic atherosclerosis noted, to continue annually.  COPD status: stable Satisfied with current treatment?: yes Oxygen  use: yes Dyspnea frequency: daily Cough frequency: at baseline Rescue inhaler frequency: every day Limitation of activity: no Productive cough: yes Last Spirometry: with pulmonary Pneumovax: Up to Date Influenza: Refuses  ANXIETY/STRESS Taking Duloxetine  and Lyrica  for both pain and mood.  In past has used Wellbutrin , Duloxetine , Prozac , Celexa .  History: Chronic back pain present, cannot take Tylenol . Dr. Michale Age performed  recent posterior lumbar interbody fusion on 12/08/22 and she did follow-up with him recently, she reports no changes and is to return in 6 months but continues to have pain.  Severe spinal stenosis L4-L5.  Had injections in past with no benefit. Currently is taking Lyrica  25 MG BID along with Cymbalta  for pain, but feels Lyrica  does not work well.  On average pain is a 7/10.  Does not want to go to a pain clinic. Duration:stable Anxious mood: sometimes Excessive worrying: no Irritability: no Sweating: no Nausea: no Palpitations:no Hyperventilation: no Panic attacks: no Agoraphobia: no  Obscessions/compulsions: no Depressed mood: yes,  sometimes    06/27/2023   11:26 AM 05/10/23   10:01 AM 02/01/2023    9:53 AM 09/01/2022    9:51 AM 08/02/2022    9:44 AM  Depression screen PHQ 2/9  Decreased Interest 3 0 3 0 2  Down, Depressed, Hopeless 1 0 3 0 2  PHQ - 2 Score 4 0 6 0 4  Altered sleeping 2 0 3 2 2   Tired, decreased energy 3 0 3 1 2   Change in appetite 3 0 3 1 0  Feeling bad or failure about yourself  0 0 0 0 0  Trouble concentrating 0 0 0 0 0  Moving slowly or fidgety/restless 0 0 1 0 0  Suicidal thoughts 0 0 0 0 0  PHQ-9 Score 12 0 16 4 8   Difficult doing work/chores Very difficult Not difficult at all Somewhat difficult Not difficult at all Somewhat difficult  Anhedonia: no Weight changes: no Insomnia: yes hard to fall asleep -- due to pain Hypersomnia: no Fatigue/loss of energy: no Feelings of worthlessness: no Feelings of guilt: no Impaired concentration/indecisiveness: no Suicidal ideations: no  Crying spells: no Recent Stressors/Life Changes: no   Relationship problems: no   Family stress: no     Financial stress: no    Job stress: no    Recent death/loss: no    05-10-2023   10:01 AM 02/01/2023    9:54 AM 09/01/2022    9:52 AM 08/02/2022    9:45 AM  GAD 7 : Generalized Anxiety Score  Nervous, Anxious, on Edge 0 1 0 2  Control/stop worrying 0 1 0 2  Worry too much - different things 0 1 1 2   Trouble relaxing 0 0 0 0  Restless 0 0 0 0  Easily annoyed or irritable 0 2 2 2   Afraid - awful might happen 0 0 0 2  Total GAD 7 Score 0 5 3 10   Anxiety Difficulty Not difficult at all Somewhat difficult Not difficult at all Somewhat difficult   Relevant past medical, surgical, family and social history reviewed and updated as indicated. Interim medical history since our last visit reviewed. Allergies and medications reviewed and updated.  Review of Systems  Constitutional:  Negative for activity change, appetite change, diaphoresis, fatigue and fever.  Respiratory:  Positive for cough, chest  tightness, shortness of breath and wheezing.   Cardiovascular:  Positive for leg swelling (occasional). Negative for chest pain and palpitations.  Gastrointestinal: Negative.   Endocrine: Negative for polydipsia, polyphagia and polyuria.  Neurological:  Positive for headaches (occasional). Negative for dizziness, syncope, weakness, light-headedness and numbness.  Psychiatric/Behavioral: Negative.     Per HPI unless specifically indicated above     Objective:    BP 113/69   Pulse 80   Temp 97.7 F (36.5 C) (Oral)   Ht 5\' 7"  (1.702 m)   Wt 215 lb (  97.5 kg)   SpO2 95%   BMI 33.67 kg/m   Wt Readings from Last 3 Encounters:  08/03/23 215 lb (97.5 kg)  06/27/23 208 lb (94.3 kg)  06/15/23 210 lb 12.8 oz (95.6 kg)    Physical Exam Vitals and nursing note reviewed.  Constitutional:      General: She is awake. She is not in acute distress.    Appearance: She is well-developed and well-groomed. She is obese. She is not ill-appearing or toxic-appearing.  HENT:     Head: Normocephalic.     Right Ear: Hearing and external ear normal.     Left Ear: Hearing and external ear normal.  Eyes:     General: Lids are normal.        Right eye: No discharge.        Left eye: No discharge.     Conjunctiva/sclera: Conjunctivae normal.     Pupils: Pupils are equal, round, and reactive to light.  Neck:     Thyroid : No thyromegaly.     Vascular: No carotid bruit.  Cardiovascular:     Rate and Rhythm: Normal rate and regular rhythm.     Heart sounds: Normal heart sounds. No murmur heard.    No gallop.  Pulmonary:     Effort: Pulmonary effort is normal. No accessory muscle usage or respiratory distress.     Breath sounds: Decreased air movement present. Wheezing present. No decreased breath sounds or rales.     Comments: Mild decrease in air movement present and scattered expiratory wheezes noted throughout. Abdominal:     General: Bowel sounds are normal. There is no distension.      Palpations: Abdomen is soft.     Tenderness: There is no abdominal tenderness.  Musculoskeletal:     Cervical back: Normal range of motion and neck supple.     Right lower leg: No edema.     Left lower leg: No edema.  Lymphadenopathy:     Head:     Right side of head: No submental, submandibular, tonsillar, preauricular or posterior auricular adenopathy.     Left side of head: No submental, submandibular, tonsillar, preauricular or posterior auricular adenopathy.     Cervical: No cervical adenopathy.  Skin:    General: Skin is warm and dry.  Neurological:     Mental Status: She is alert and oriented to person, place, and time.     Deep Tendon Reflexes: Reflexes are normal and symmetric.     Reflex Scores:      Brachioradialis reflexes are 2+ on the right side and 2+ on the left side.      Patellar reflexes are 2+ on the right side and 2+ on the left side. Psychiatric:        Attention and Perception: Attention normal.        Mood and Affect: Mood normal.        Speech: Speech normal.        Behavior: Behavior normal. Behavior is cooperative.        Thought Content: Thought content normal.    Results for orders placed or performed in visit on 06/22/23  HM DIABETES EYE EXAM   Collection Time: 06/22/23  4:58 PM  Result Value Ref Range   HM Diabetic Eye Exam No Retinopathy No Retinopathy      Assessment & Plan:   Problem List Items Addressed This Visit       Cardiovascular and Mediastinum   Type 2 diabetes, controlled, with  peripheral circulatory disorder (HCC) - Primary   Chronic, ongoing with A1c slowly trending down to 8.6% today.  Urine ALB 80 January 2025.  Recommend she continue Olmesartan  daily for kidney protection. In past she did not tolerate Metformin  at higher doses and did not tolerate GLP1 or SGLT2.  Check BS BID, bring to visit in 4 weeks.  Continue to collaborate with vascular.  - Discussed at length treatment options with her.  Will maintain Januvia  on board and  start Tresiba  10 units nightly with plan to increase dependent on sugars.  Educated at length on this regimen and use.  She has had poor tolerance of multiple medications and concern that a sulfonylurea would cause hypoglycemia and falls.  - ARB and statin on board. - Vaccinations up to date. - Foot and eye exam up to date.      Relevant Medications   insulin  degludec (TRESIBA ) 100 UNIT/ML FlexTouch Pen   Other Relevant Orders   Bayer DCA Hb A1c Waived   Hypertension associated with diabetes (HCC)   Chronic, stable.  BP well below goal in office today and at home.  Continue Olmesartan  5 MG daily for kidney protection and Verapamil  80 MG daily + Lasix .  Recommend she monitor BP at least a few mornings a week at home and document.  DASH diet at home.  Labs today: CMP.  Return in 3 months.      Relevant Medications   insulin  degludec (TRESIBA ) 100 UNIT/ML FlexTouch Pen   Other Relevant Orders   Bayer DCA Hb A1c Waived   Comprehensive metabolic panel with GFR   Chronic diastolic heart failure (HCC)   Chronic, stable.  Euvolemic today in office.  EF 05/02/23 was 60-65%.  Continue collaboration with cardiology, recent note reviewed.  Continue current medication regimen. - Reminded to call for an overnight weight gain of >2 pounds or a weekly weight gain of >5 pounds - not adding salt to food and read food labels. Reviewed the importance of keeping daily sodium intake to 2000mg  daily. - Avoid NSAIDs      Blue toe syndrome (HCC)   Chronic, ongoing.  Continue collaboration with vascular.  Recent notes reviewed.       Aortic atherosclerosis (HCC)   Chronic, ongoing noted on lung CT screening.  Continue statin and ASA daily for prevention.  Continue collaboration with vascular.  Recommend complete cessation smoking.        Respiratory   Chronic respiratory failure with hypoxia and hypercapnia (HCC)   Continue to collaborate with pulmonary.  Tolerating O2, continue use of this.       Centrilobular emphysema (HCC)   Chronic, ongoing with ongoing productive cough. Suspect some of this is related to her not using a daily inhaler and using Albuterol  only. Discussed at length with her need for daily inhaler, would benefit triple therapy but does not tolerate the steroid element.   - Will trial Bevespi  to allow for LABA/LAMA on board.  Discussed at length with her.  Continue Albuterol  as needed.   - Recommend complete cessation of smoking.   - Continue annual CT scans.   - Continue collaboration with pulmonary, recent note reviewed.   - Continue to wear oxygen  consistently at 3L [place].  Treat current cough with Augmentin  BID for 7 days.        Relevant Medications   albuterol  (VENTOLIN  HFA) 108 (90 Base) MCG/ACT inhaler   Glycopyrrolate -Formoterol  (BEVESPI  AEROSPHERE) 9-4.8 MCG/ACT AERO     Endocrine   Hyperlipidemia  associated with type 2 diabetes mellitus (HCC)   Chronic, ongoing.  Highly recommend she continue Crestor  and adjust dosing as needed or add on Zetia if poor control.  Lipid check today.      Relevant Medications   insulin  degludec (TRESIBA ) 100 UNIT/ML FlexTouch Pen   Other Relevant Orders   Bayer DCA Hb A1c Waived   Lipid Panel w/o Chol/HDL Ratio     Nervous and Auditory   Nicotine  dependence, cigarettes, w unsp disorders   I have recommended complete cessation of tobacco use. I have discussed various options available for assistance with tobacco cessation including over the counter methods (Nicotine  gum, patch and lozenges). We also discussed prescription options (Chantix , Nicotine  Inhaler / Nasal Spray). The patient is not interested in pursuing any prescription tobacco cessation options at this time.  Continue yearly lung screening.         Other   Obesity   BMI 33.67.  Recommended eating smaller high protein, low fat meals more frequently and exercising 30 mins a day 5 times a week with a goal of 10-15lb weight loss in the next 3 months. Patient voiced  their understanding and motivation to adhere to these recommendations.       Relevant Medications   insulin  degludec (TRESIBA ) 100 UNIT/ML FlexTouch Pen   Generalized anxiety disorder   Chronic, ongoing. Denies SI/HI.  Will continue Duloxetine  60 MG since is offering benefit to mood and chronic back pain -- she does not wish to add on any other medicine, although scores are elevated.  Educated her on this medication and change + side effects and BLACK BOX warning.           Follow up plan: Return in 4 weeks (on 08/31/2023) for T2DM -- started Tresiba  on 08/03/23.

## 2023-08-03 NOTE — Assessment & Plan Note (Signed)
 I have recommended complete cessation of tobacco use. I have discussed various options available for assistance with tobacco cessation including over the counter methods (Nicotine gum, patch and lozenges). We also discussed prescription options (Chantix, Nicotine Inhaler / Nasal Spray). The patient is not interested in pursuing any prescription tobacco cessation options at this time.  Continue yearly lung screening.

## 2023-08-03 NOTE — Assessment & Plan Note (Signed)
 Chronic, ongoing.  Continue collaboration with vascular.  Recent notes reviewed.

## 2023-08-03 NOTE — Assessment & Plan Note (Signed)
 Chronic, stable.  BP well below goal in office today and at home.  Continue Olmesartan  5 MG daily for kidney protection and Verapamil  80 MG daily + Lasix .  Recommend she monitor BP at least a few mornings a week at home and document.  DASH diet at home.  Labs today: CMP.  Return in 3 months.

## 2023-08-03 NOTE — Assessment & Plan Note (Signed)
 Chronic, ongoing with ongoing productive cough. Suspect some of this is related to her not using a daily inhaler and using Albuterol  only. Discussed at length with her need for daily inhaler, would benefit triple therapy but does not tolerate the steroid element.   - Will trial Bevespi  to allow for LABA/LAMA on board.  Discussed at length with her.  Continue Albuterol  as needed.   - Recommend complete cessation of smoking.   - Continue annual CT scans.   - Continue collaboration with pulmonary, recent note reviewed.   - Continue to wear oxygen  consistently at 3L [place].  Treat current cough with Augmentin  BID for 7 days.

## 2023-08-03 NOTE — Assessment & Plan Note (Signed)
 Chronic, ongoing noted on lung CT screening.  Continue statin and ASA daily for prevention.  Continue collaboration with vascular.  Recommend complete cessation smoking.

## 2023-08-04 DIAGNOSIS — M170 Bilateral primary osteoarthritis of knee: Secondary | ICD-10-CM | POA: Diagnosis not present

## 2023-08-04 LAB — LIPID PANEL W/O CHOL/HDL RATIO
Cholesterol, Total: 140 mg/dL (ref 100–199)
HDL: 42 mg/dL (ref >39)
LDL Chol Calc (NIH): 65 mg/dL (ref 0–99)
Triglycerides: 201 mg/dL — ABNORMAL HIGH (ref 0–149)
VLDL Cholesterol Cal: 33 mg/dL (ref 5–40)

## 2023-08-04 LAB — COMPREHENSIVE METABOLIC PANEL WITH GFR
ALT: 9 IU/L (ref 0–32)
AST: 16 IU/L (ref 0–40)
Albumin: 4.2 g/dL (ref 3.9–4.9)
Alkaline Phosphatase: 107 IU/L (ref 44–121)
BUN/Creatinine Ratio: 9 — ABNORMAL LOW (ref 12–28)
BUN: 9 mg/dL (ref 8–27)
Bilirubin Total: 0.3 mg/dL (ref 0.0–1.2)
CO2: 30 mmol/L — ABNORMAL HIGH (ref 20–29)
Calcium: 9.5 mg/dL (ref 8.7–10.3)
Chloride: 93 mmol/L — ABNORMAL LOW (ref 96–106)
Creatinine, Ser: 1.02 mg/dL — ABNORMAL HIGH (ref 0.57–1.00)
Globulin, Total: 2.1 g/dL (ref 1.5–4.5)
Glucose: 166 mg/dL — ABNORMAL HIGH (ref 70–99)
Potassium: 4.7 mmol/L (ref 3.5–5.2)
Sodium: 138 mmol/L (ref 134–144)
Total Protein: 6.3 g/dL (ref 6.0–8.5)
eGFR: 61 mL/min/{1.73_m2} (ref >59)

## 2023-08-04 NOTE — Progress Notes (Signed)
 Good morning, please let Mileva know her labs have returned: - Lipid panel is showing stable LDL level, bad cholesterol, but triglycerides remain a little elevated.  Continue all medications and heavy focus on healthy diet. - Kidney and liver function levels are stable.  CO2 is a bit elevated, mild, please start new daily inhaler as we discussed.  Any questions? Keep being amazing!!  Thank you for allowing me to participate in your care.  I appreciate you. Kindest regards, Tjuana Vickrey

## 2023-08-04 NOTE — Progress Notes (Signed)
 Please let patient know her mammogram is normal and repeat in one year.

## 2023-08-05 NOTE — Telephone Encounter (Signed)
 Duplicate request, lasted refilled 08/03/23.E-Prescribing Status: Receipt confirmed by pharmacy (08/03/2023 11:23 AM EDT).  Requested Prescriptions  Pending Prescriptions Disp Refills   albuterol  (VENTOLIN  HFA) 108 (90 Base) MCG/ACT inhaler [Pharmacy Med Name: ALBUTEROL  SULFATE HFA 108 (90 BASE)] 8.5 g 3    Sig: INHALE 2 PUFFS BY MOUTH INTO THE LUNGS EVERY 6 HOURS AS NEEDED FOR WHEEZING OR SHORTNESS OF BREATH     Pulmonology:  Beta Agonists 2 Passed - 08/05/2023  9:02 AM      Passed - Last BP in normal range    BP Readings from Last 1 Encounters:  08/03/23 113/69         Passed - Last Heart Rate in normal range    Pulse Readings from Last 1 Encounters:  08/03/23 80         Passed - Valid encounter within last 12 months    Recent Outpatient Visits           2 days ago Type 2 diabetes, controlled, with peripheral circulatory disorder (HCC)   [place] [facility] [place], Sanjuan Crumbly T, NP   1 month ago Centrilobular emphysema (HCC)   [place] [facility] [place], Lavelle Posey, NP

## 2023-08-07 ENCOUNTER — Telehealth: Payer: Self-pay | Admitting: Nurse Practitioner

## 2023-08-08 MED ORDER — PEN NEEDLES 31G X 8 MM MISC: 2 refills | Status: DC

## 2023-08-08 NOTE — Addendum Note (Signed)
 Addended by: Lillee Mooneyhan T on: 08/08/2023 08:14 AM   Modules accepted: Orders

## 2023-08-11 ENCOUNTER — Other Ambulatory Visit: Payer: Self-pay | Admitting: Nurse Practitioner

## 2023-08-14 NOTE — Telephone Encounter (Signed)
 Requested Prescriptions  Refused Prescriptions Disp Refills   nystatin  (MYCOSTATIN ) 100000 UNIT/ML suspension [Pharmacy Med Name: NYSTATIN  100000 UNIT/ML MOUTH SUSP] 60 mL 0    Sig: TAKE 5 MLS (500,000 UNITS TOTAL) BY MOUTH FOUR TIMES A DAY     There is no refill protocol information for this order

## 2023-08-15 ENCOUNTER — Other Ambulatory Visit: Payer: Self-pay | Admitting: Nurse Practitioner

## 2023-08-15 ENCOUNTER — Ambulatory Visit: Payer: Self-pay

## 2023-08-15 MED ORDER — NYSTATIN 100000 UNIT/ML MT SUSP: 5.0000 mL | Freq: Four times a day (QID) | OROMUCOSAL | 0 refills | Status: DC

## 2023-08-15 NOTE — Telephone Encounter (Signed)
 Copied from CRM [id number]. Topic: Clinical - Prescription Issue >> Aug 15, 2023  8:56 AM Hassie Lint wrote: Reason for CRM: Patient called in asking to speak with NP [facility] or her nurse. States she talked to the pharmacy yesterday and the denied her request for a refill of nystatin  (MYCOSTATIN ) 100000 UNIT/ML. Advised request still shows its waiting on a response.  Patient would like a call back to further discuss.  Patient can be reached at [phone]

## 2023-08-15 NOTE — Telephone Encounter (Signed)
 Called and notified patient that RX has been sent in for her.

## 2023-08-15 NOTE — Telephone Encounter (Signed)
 error

## 2023-08-15 NOTE — Telephone Encounter (Signed)
 RX is not on current med list. Can we send this in for the patient?

## 2023-08-15 NOTE — Addendum Note (Signed)
 Addended by: Sharyah Bostwick T on: 08/15/2023 12:03 PM   Modules accepted: Orders

## 2023-08-16 ENCOUNTER — Other Ambulatory Visit: Payer: Self-pay

## 2023-08-16 NOTE — Progress Notes (Signed)
   08/16/2023  Patient ID: Stacey Gross, female   DOB: 1960/07/18, 64 y.o.   MRN: [id number]  Subjective/Objective Telephone visit to follow-up on management of diabetes  Diabetes -Current medications:  Januvia  100mg  daily, Tresiba  18 units at bedtime -Patient recently prescribed Tresiba  to assist with control of T2DM- started at 10 units nightly, titrating 2-3 units every 2-3 days; and she is currently at 18 units nightly. -Patient does monitor home FBG and endorses value of 132 this morning, which is the lowest she has seen.  This has decreased from previously values in the 200 and 300's -Recent A1c of 8.6%- down from 9.5% prior -Does not endorse any s/sx of hypoglycemia -Did not tolerate metformin , GLP1, or SGLT2 medications well in the past -Endorses affordability of Tresiba  and all medications, stating she does not have copays for her prescriptions  Assessment/Plan  Diabetes -Recommend patient continue to adjust Tresiba  by 2 units every 3 days until FBG consistently 110-130 -Continue to monitor and record FBG -Sees PCP again 5/30  Follow-up:  6 weeks  Linn Rich, PharmD, DPLA

## 2023-08-17 ENCOUNTER — Encounter: Payer: Self-pay | Admitting: Pulmonary Disease

## 2023-08-17 ENCOUNTER — Ambulatory Visit: Admitting: Pulmonary Disease

## 2023-08-17 VITALS — BP 124/78 | HR 81 | Temp 98.0°F | Ht 67.0 in | Wt 217.0 lb

## 2023-08-17 DIAGNOSIS — J441 Chronic obstructive pulmonary disease with (acute) exacerbation: Secondary | ICD-10-CM

## 2023-08-17 DIAGNOSIS — J440 Chronic obstructive pulmonary disease with acute lower respiratory infection: Secondary | ICD-10-CM

## 2023-08-17 DIAGNOSIS — J9611 Chronic respiratory failure with hypoxia: Secondary | ICD-10-CM

## 2023-08-17 DIAGNOSIS — J209 Acute bronchitis, unspecified: Secondary | ICD-10-CM

## 2023-08-17 DIAGNOSIS — F17210 Nicotine dependence, cigarettes, uncomplicated: Secondary | ICD-10-CM | POA: Diagnosis not present

## 2023-08-17 MED ORDER — ALBUTEROL SULFATE (2.5 MG/3ML) 0.083% IN NEBU
2.5000 mg | INHALATION_SOLUTION | Freq: Once | RESPIRATORY_TRACT | Status: AC
  Administered 2023-08-17: 2.5 mg via RESPIRATORY_TRACT

## 2023-08-17 NOTE — Progress Notes (Signed)
 Subjective:    Patient ID: Stacey Gross, female    DOB: 27-Apr-1960, 63 y.o.   MRN: [id number]  Patient Care Team: Cannady, Jolene T, NP as PCP - General (Nurse Practitioner) Constancia Delton, MD as PCP - Cardiology (Cardiology) Marc Senior, MD as Consulting Physician (Pulmonary Disease) [facility])  Chief Complaint  Patient presents with   Follow-up    She has had "rattling in chest" and cough with yellow sputum over the past month- just finished round of amox per PCP.     BACKGROUND/INTERVAL: 63 year old current smoker (0.5 PPD, 96 PY) who presents for follow-up on the issue of COPD and shortness of breath. Patient has had issues with increasing shortness of breath since 2022. Her last visit with me was on 15 June 2023.  Recent exacerbation treated by primary care provider with Augmentin , prescribed prednisone  but advised to hold until seen by us .  Never had PFTs ordered in May 2024.  Recent lung cancer screening CT 27 April 2023, lung RADS 2.    HPI Discussed the use of AI scribe software for clinical note transcription with the patient, who gave verbal consent to proceed.  History of Present Illness   Stacey Gross is a 63 year old female with diabetes who presents with persistent cough and respiratory symptoms.  She has been experiencing a persistent cough and respiratory symptoms for the past week, characterized by the production of yellow sputum.Despite completing a course of amoxicillin , her symptoms persist.  She is currently using a nebulizer three times a day and has developed oral thrush, which she attributes to the nebulizer treatment. Her current medications include budesonide  (Pulmicort ) and Bevespi , which she uses twice daily, and albuterol  as needed.  Suspect thrush issues due to budesonide .    She has a known allergy to vancomycin, which causes her tongue to swell, but has previously taken azithromycin  (Z-Pak) without issues.  She also experiences reflux symptoms, particularly after eating, and takes Protonix  once daily for management.  She is a smoker and is attempting to quit, acknowledging the difficulty of cessation.  Currently smoking a half a pack of cigarettes per day.  She was counseled again with regards to smoking cessation.  Despite the symptoms she has not had any fevers, chills or sweats.  As noted above she was prescribed prednisone  however it has been holding off on this until this visit.  This was prescribed by her primary care provider.  She has had worsening of her symptoms of gastroesophageal reflux.  She has been compliant with oxygen  therapy.     DATA 10/29/2020 PFTs: FEV1 1.42 L or 50% predicted, FVC 2.33 L or 64% predicted, FEV1/FVC 61%.  No bronchodilator response.  Air-trapping and hyperinflation noted, diffusion capacity moderately reduced, consistent with moderate COPD with emphysema.   07/14/2021 LDCT: Lung RADS 3 probably benign findings.  No solid pulmonary nodule in the right lower lobe measuring 4.8 mm.  Short-term follow-up in 6 months recommended.  Moderate emphysema noted. 10/20/2021 echocardiogram: LVEF 50 to 55%, grade 2 DD, moderately increased right ventricular wall thickness.  This represented a slight decrease in LVEF and increase in diastolic dysfunction from prior. 11/15/2021 overnight oximetry: Oxygen  desaturations to as low as 76%.  Oxygen  at 2 L/min nocturnally prescribed. 01/21/2022 LDCT: Lung RADS 2, previously noted lung nodules unchanged.  No nonsolid nodule within the apical segment of the right upper lobe at 5.9 mm. 03/01/2023 CT angio chest: No pulmonary embolism.  Small right  pleural effusion with associated compressive atelectatic change in the right lower lobe, no effusion on the left, mass, consolidation or pneumothorax.  No suspicious lung nodules. 04/27/2023 LDCT: Lung RADS 2S, multiple small pulmonary nodules scattered throughout the lungs.  Unchanged from prior.   Mild diffuse bronchial wall thickening and mild centrilobular and paraseptal emphysema  Review of Systems A 10 point review of systems was performed and it is as noted above otherwise negative.   Patient Active Problem List   Diagnosis Date Noted   Abnormal imaging of thyroid  05/05/2023   CAD (coronary artery disease) 03/01/2023   Prolonged QT interval 03/01/2023   Spondylolisthesis of lumbar region 12/08/2022   Chronic bilateral low back pain 08/02/2022   Chronic diastolic heart failure (HCC) 01/30/2022   Esophageal dysphagia    Vitamin D  deficiency 04/01/2021   Chronic respiratory failure with hypoxia and hypercapnia (HCC) 08/14/2020   PVD (peripheral vascular disease) (HCC) 07/25/2020   B12 deficiency 01/07/2020   Obesity 10/04/2019   Status post total shoulder arthroplasty, right 07/24/2019   Senile purpura (HCC) 06/17/2019   Hepatic steatosis 06/03/2019   Nummular dermatitis 05/28/2019   Centrilobular emphysema (HCC) 05/14/2019   Generalized anxiety disorder 12/17/2018   Type 2 diabetes, controlled, with peripheral circulatory disorder (HCC) 03/21/2018   Hx of colonic polyps    Ectopic gastric tissue    Aortic atherosclerosis (HCC) 04/18/2017   Advanced care planning/counseling discussion 02/22/2017   Blue toe syndrome (HCC) 03/04/2016   GERD (gastroesophageal reflux disease) 12/30/2014   Hyperlipidemia associated with type 2 diabetes mellitus (HCC)    Hypertension associated with diabetes (HCC)    Degenerative joint disease (DJD) of hip    S/P lumbar fusion 07/02/2013   Nicotine  dependence, cigarettes, w unsp disorders 02/23/2010    Social History   Tobacco Use   Smoking status: Every Day    Current packs/day: 0.75    Average packs/day: 2.0 packs/day for 52.4 years (103.1 ttl pk-yrs)    Types: Cigarettes    Start date: 1973   Smokeless tobacco: Never   Tobacco comments:    0.5 PPD khj 06/15/2023,08/17/23  Substance Use Topics   Alcohol use: No    Allergies   Allergen Reactions   Doxycycline  Hyclate Anaphylaxis, Swelling and Rash   Acetaminophen  Swelling    Tongue swelling and breaks out in welts   Aspirin  Swelling    Tongue swelling and breaks out in welts   Latex Rash    Gloves, underwear elastic, tape   Vancomycin Rash    All mycins    Current Meds  Medication Sig   Accu-Chek Softclix Lancets lancets USE TO CHECK BLOOD SUGAR DAILY   albuterol  (PROVENTIL ) (2.5 MG/3ML) 0.083% nebulizer solution Take 3 mLs (2.5 mg total) by nebulization every 6 (six) hours as needed for wheezing or shortness of breath.   albuterol  (VENTOLIN  HFA) 108 (90 Base) MCG/ACT inhaler INHALE 2 PUFFS BY MOUTH INTO THE LUNGS EVERY 6 HOURS AS NEEDED FOR WHEEZING OR SHORTNESS OF BREATH   Blood Glucose Monitoring Suppl (ACCU-CHEK GUIDE ME) w/Device KIT Use to check blood sugars 2-3 times daily with goals = <130 fasting in morning and <180 two hours after eating.  Bring blood sugar log to appointments.   Cholecalciferol  25 MCG (1000 UT) capsule Take 2,000 Units by mouth daily.   cyanocobalamin  (VITAMIN B12) 1000 MCG tablet Take 1 tablet (1,000 mcg total) by mouth daily.   cyclobenzaprine  (FLEXERIL ) 10 MG tablet Take 10 mg by mouth 3 (three) times daily as  needed for muscle spasms.   DULoxetine  (CYMBALTA ) 60 MG capsule Take 1 capsule (60 mg total) by mouth daily.   furosemide  (LASIX ) 20 MG tablet Take 1 tablet (20 mg total) by mouth daily as needed. Please keep appointment on 04/18/2023 for further refills. Thank you.   glucose blood (ACCU-CHEK GUIDE) test strip Use as instructed to check blood sugar daily   Glycopyrrolate -Formoterol  (BEVESPI  AEROSPHERE) 9-4.8 MCG/ACT AERO Inhale 2 puffs into the lungs daily.   hydrOXYzine  (VISTARIL ) 25 MG capsule Take 1 capsule (25 mg total) by mouth every 8 (eight) hours as needed.   insulin  degludec (TRESIBA ) 100 UNIT/ML FlexTouch Pen Inject 10 Units into the skin daily. (Patient taking differently: Inject 20 Units into the skin daily.)    Insulin  Pen Needle (PEN NEEDLES) 31G X 8 MM MISC To use for injecting insulin  daily.   lidocaine  (LIDODERM ) 5 % Place 1 patch onto the skin daily. Remove & Discard patch within 12 hours or as directed by MD   nystatin  (MYCOSTATIN ) 100000 UNIT/ML suspension Take 5 mLs (500,000 Units total) by mouth 4 (four) times daily.   olmesartan  (BENICAR ) 5 MG tablet Take 1 tablet (5 mg total) by mouth daily.   OXYGEN  Inhale 3 L into the lungs at bedtime.   pantoprazole  (PROTONIX ) 40 MG tablet TAKE 1 TABLET BY MOUTH DAILY   pregabalin  (LYRICA ) 50 MG capsule Take 1 capsule (50 mg total) by mouth 2 (two) times daily.   prochlorperazine  (COMPAZINE ) 10 MG tablet Take 10 mg by mouth every 6 (six) hours as needed for vomiting or nausea.   rosuvastatin  (CRESTOR ) 40 MG tablet Take 1 tablet (40 mg total) by mouth daily.   sitaGLIPtin  (JANUVIA ) 100 MG tablet Take 1 tablet (100 mg total) by mouth daily.   traZODone  (DESYREL ) 100 MG tablet Take 1 tablet (100 mg total) by mouth at bedtime as needed. for sleep   verapamil  (CALAN ) 80 MG tablet Take 1 tablet (80 mg total) by mouth daily.    Immunization History  Administered Date(s) Administered   Influenza,inj,Quad PF,6+ Mos 02/09/2016, 02/22/2017, 05/09/2018, 12/17/2018, 01/07/2020   Pneumococcal Polysaccharide-23 02/09/2016   Td 04/04/2004, 05/13/2014   Zoster Recombinant(Shingrix ) 01/07/2019, 04/22/2019        Objective:     BP 124/78 (BP Location: Right Arm, Cuff Size: Normal)   Pulse 81   Temp 98 F (36.7 C) (Oral)   Ht 5\' 7"  (1.702 m)   Wt 217 lb (98.4 kg)   SpO2 97%   BMI 33.99 kg/m   SpO2: 97 % O2 Device: Nasal cannula O2 Flow Rate (L/min): 3 L/min O2 Type: Pulse O2  GENERAL: Well-developed, obese, fully ambulatory, no conversational dyspnea. HEAD: Normocephalic, atraumatic.  EYES: Pupils equal, round, reactive to light. No scleral icterus.  MOUTH: Edentulous upper, poor dentition lower.  Mild signs of thrush. NECK: Supple. No thyromegaly.  Trachea midline. No JVD. No adenopathy. PULMONARY: Distant breath sounds bilaterally.  Rhonchi throughout, few wheezes. CARDIOVASCULAR: S1 and S2. Regular rate and rhythm. No rubs, murmurs or gallops heard. ABDOMEN: Obese otherwise benign MUSCULOSKELETAL: No joint deformity, no clubbing, no edema.  NEUROLOGIC: No overt focal deficit, no gait disturbance, speech is fluent.  No asterixis. SKIN: Intact,warm,dry. PSYCH: Mood and behavior normal.   Patient received nebulization treatment with DuoNeb which cleared her rhonchi and wheezes and improved her shortness of breath.      Assessment & Plan:     ICD-10-CM   1. COPD with acute exacerbation (HCC)  J44.1 DG Chest 2 View  albuterol  (PROVENTIL ) (2.5 MG/3ML) 0.083% nebulizer solution 2.5 mg    2. Chronic respiratory failure with hypoxia (HCC)  J96.11 albuterol  (PROVENTIL ) (2.5 MG/3ML) 0.083% nebulizer solution 2.5 mg    3. Acute bronchitis with COPD (HCC)  J44.0 albuterol  (PROVENTIL ) (2.5 MG/3ML) 0.083% nebulizer solution 2.5 mg   J20.9     4. Tobacco dependence due to cigarettes  F17.210       Orders Placed This Encounter  Procedures   DG Chest 2 View    Standing Status:   Future    Expected Date:   08/17/2023    Expiration Date:   08/16/2024    Reason for Exam (SYMPTOM  OR DIAGNOSIS REQUIRED):   Cough, Shortness of breath    Preferred imaging location?:   [facility]   Discussion:    Chronic obstructive pulmonary disease (COPD) with exacerbation COPD exacerbation with ongoing cough and yellow sputum production. Wheezing present. Previous treatment included amoxicillin  and nebulizer therapy. Concerns about thrush with nebulizer use.  No known allergy to azithromycin . Current treatment includes Bevespi  and albuterol . Plan to assess with chest x-ray to determine need for further antibiotics. Avoid systemic steroids due to diabetes. - Order chest x-ray - Administer albuterol  nebulizer treatment in office - Instruct to use  albuterol  nebulizer as needed at home - Continue Bevespi  twice daily - Discontinue nebulizer with steroid component - Send prescription for plain albuterol   Oral thrush Oral thrush likely secondary to inhaled steroids and recent antibiotic use, compounded by diabetes. Current mouthwash may be exacerbating irritation due to alcohol content. - Switch to Biotene mouthwash without alcohol  Diabetes mellitus Diabetes may be contributing to oral thrush and complicating treatment options for COPD exacerbation.  Gastroesophageal reflux disease (GERD) Symptoms of reflux with sensation of food hanging in the esophagus and regurgitation. Currently on Protonix  once daily. - Increase Protonix  to twice daily for one week - Refer to gastroenterologist for further evaluation  Follow-up - Follow up in 3-4 weeks      Advised if symptoms do not improve or worsen, to please contact office for sooner follow up or seek emergency care.    I spent 40 minutes of dedicated to the care of this patient on the date of this encounter to include pre-visit review of records, face-to-face time with the patient discussing conditions above, post visit ordering of testing, clinical documentation with the electronic health record, making appropriate referrals as documented, and communicating necessary findings to members of the patients care team.     C. Chloe Counter, MD Advanced Bronchoscopy PCCM [facility]-    *This note was generated using voice recognition software/Dragon and/or AI transcription program.  Despite best efforts to proofread, errors can occur which can change the meaning. Any transcriptional errors that result from this process are unintentional and may not be fully corrected at the time of dictation.

## 2023-08-17 NOTE — Patient Instructions (Signed)
 VISIT SUMMARY:  Today, you were seen for a persistent cough and respiratory symptoms that have lasted for a week. You have been using a nebulizer and completed a course of amoxicillin , but your symptoms have not improved. You also developed oral thrush, which you believe is related to your nebulizer treatment. Additionally, you experience reflux symptoms and are currently taking Protonix . You are a smoker and are trying to quit.  YOUR PLAN:  -CHRONIC OBSTRUCTIVE PULMONARY DISEASE (COPD) WITH EXACERBATION: COPD is a chronic lung disease that makes it hard to breathe. Your COPD has worsened, causing a persistent cough and yellow sputum. We will do a chest x-ray to see if you need more antibiotics. You received an albuterol  nebulizer treatment in the office and should continue using albuterol  at home as needed. Continue taking Bevespi  twice daily, but stop using the nebulizer with steroids (budesonide ). We will send a prescription for plain albuterol .  -ORAL THRUSH: Oral thrush is a fungal infection in your mouth, likely caused by your inhaled steroids and recent antibiotics, and worsened by diabetes. Switch to Biotene mouthwash without alcohol to reduce irritation.  Examination today was mostly consistent with irritation more so than thrush.  There is also evidence that you may be having significant gastric reflux.  -DIABETES MELLITUS: Diabetes is a condition where your blood sugar levels are too high. It may be contributing to your oral thrush and complicating your COPD treatment.  -GASTROESOPHAGEAL REFLUX DISEASE (GERD): GERD is a condition where stomach acid frequently flows back into the tube connecting your mouth and stomach. Increase your Protonix  to twice daily for one week and we will refer you to a gastroenterologist for further evaluation.   INSTRUCTIONS:  Please follow up in 3-4 weeks. We will review the results of your chest x-ray and assess your progress with the new treatment plan.  We  will call you if additional antibiotics are necessary.

## 2023-08-27 NOTE — Patient Instructions (Signed)
 Be Involved in Caring For Your Health:  Taking Medications When medications are taken as directed, they can greatly improve your health. But if they are not taken as prescribed, they may not work. In some cases, not taking them correctly can be harmful. To help ensure your treatment remains effective and safe, understand your medications and how to take them. Bring your medications to each visit for review by your provider.  Your lab results, notes, and after visit summary will be available on My Chart. We strongly encourage you to use this feature. If lab results are abnormal the clinic will contact you with the appropriate steps. If the clinic does not contact you assume the results are satisfactory. You can always view your results on My Chart. If you have questions regarding your health or results, please contact the clinic during office hours. You can also ask questions on My Chart.  We at [facility] are grateful that you chose [place] to provide your care. We strive to provide evidence-based and compassionate care and are always looking for feedback. If you get a survey from the clinic please complete this so we can hear your opinions.  Diabetes Mellitus and Foot Care Diabetes, also called diabetes mellitus, may cause problems with your feet and legs because of poor blood flow (circulation). Poor circulation may make your skin: Become thinner and drier. Break more easily. Heal more slowly. Peel and crack. You may also have nerve damage (neuropathy). This can cause decreased feeling in your legs and feet. This means that you may not notice minor injuries to your feet that could lead to more serious problems. Finding and treating problems early is the best way to prevent future foot problems. How to care for your feet Foot hygiene  Wash your feet daily with warm water and mild soap. Do not use hot water. Then, pat your feet and the areas between your toes until they are fully dry. Do  not soak your feet. This can dry your skin. Trim your toenails straight across. Do not dig under them or around the cuticle. File the edges of your nails with an emery board or nail file. Apply a moisturizing lotion or petroleum jelly to the skin on your feet and to dry, brittle toenails. Use lotion that does not contain alcohol and is unscented. Do not apply lotion between your toes. Shoes and socks Wear clean socks or stockings every day. Make sure they are not too tight. Do not wear knee-high stockings. These may decrease blood flow to your legs. Wear shoes that fit well and have enough cushioning. Always look in your shoes before you put them on to be sure there are no objects inside. To break in new shoes, wear them for just a few hours a day. This prevents injuries on your feet. Wounds, scrapes, corns, and calluses  Check your feet daily for blisters, cuts, bruises, sores, and redness. If you cannot see the bottom of your feet, use a mirror or ask someone for help. Do not cut off corns or calluses or try to remove them with medicine. If you find a minor scrape, cut, or break in the skin on your feet, keep it and the skin around it clean and dry. You may clean these areas with mild soap and water. Do not clean the area with peroxide, alcohol, or iodine. If you have a wound, scrape, corn, or callus on your foot, look at it several times a day to make sure it  is healing and not infected. Check for: Redness, swelling, or pain. Fluid or blood. Warmth. Pus or a bad smell. General tips Do not cross your legs. This may decrease blood flow to your feet. Do not use heating pads or hot water bottles on your feet. They may burn your skin. If you have lost feeling in your feet or legs, you may not know this is happening until it is too late. Protect your feet from hot and cold by wearing shoes, such as at the beach or on hot pavement. Schedule a complete foot exam at least once a year or more often if  you have foot problems. Report any cuts, sores, or bruises to your health care provider right away. Where to find more information American Diabetes Association: diabetes.org Association of Diabetes Care & Education Specialists: diabeteseducator.org Contact a health care provider if: You have a condition that increases your risk of infection, and you have any cuts, sores, or bruises on your feet. You have an injury that is not healing. You have redness on your legs or feet. You feel burning or tingling in your legs or feet. You have pain or cramps in your legs and feet. Your legs or feet are numb. Your feet always feel cold. You have pain around any toenails. Get help right away if: You have a wound, scrape, corn, or callus on your foot and: You have signs of infection. You have a fever. You have a red line going up your leg. This information is not intended to replace advice given to you by your health care provider. Make sure you discuss any questions you have with your health care provider. Document Revised: 09/22/2021 Document Reviewed: 09/22/2021 Elsevier Patient Education  2024 [employer].

## 2023-09-01 ENCOUNTER — Encounter: Payer: Self-pay | Admitting: Nurse Practitioner

## 2023-09-01 ENCOUNTER — Ambulatory Visit: Admitting: Nurse Practitioner

## 2023-09-01 VITALS — BP 106/66 | HR 96 | Temp 98.0°F | Ht 67.0 in | Wt 214.6 lb

## 2023-09-01 DIAGNOSIS — E1151 Type 2 diabetes mellitus with diabetic peripheral angiopathy without gangrene: Secondary | ICD-10-CM

## 2023-09-01 DIAGNOSIS — Z794 Long term (current) use of insulin: Secondary | ICD-10-CM

## 2023-09-01 NOTE — Assessment & Plan Note (Signed)
 Chronic, ongoing with A1c slowly trending down to 8.6% in May and Tresiba  was started.  Sugars are now trending down.  Urine ALB 80 January 2025.  Recommend she continue Olmesartan  daily for kidney protection. In past she did not tolerate Metformin  at higher doses and did not tolerate GLP1 or SGLT2.  Check BS BID, bring to visit in 4 weeks.  Continue to collaborate with vascular.  - Continue Januvia  and Tresiba , recommend to stay at 20 units for now but if gets consistent lows is to cut back on dosage. - Continue to work with [facility] PharmD. - ARB and statin on board. - Vaccinations up to date. - Foot and eye exam up to date.

## 2023-09-01 NOTE — Progress Notes (Signed)
 BP 106/66   Pulse 96   Temp 98 F (36.7 C) (Oral)   Ht 5\' 7"  (1.702 m)   Wt 214 lb 9.6 oz (97.3 kg)   SpO2 92%   BMI 33.61 kg/m    Subjective:    Patient ID: Stacey Gross, female    DOB: 03/03/1961, 63 y.o.   MRN: [id number]  HPI: Stacey Gross is a 63 y.o. female  Chief Complaint  Patient presents with   Diabetes   DIABETES May A1c 8.6%, trend up for her as had been stable for some time.  Started Tresiba , currently taking 20 units. Continues Januvia . Metformin  caused increase in bowel movements and could not leave house. GLP1 medications caused major stomach issues and Farxiga  caused yeast infections. Taking Lyrica  50 MG BID for chronic pain, which she is tolerating. Hypoglycemic episodes:no Polydipsia/polyuria: no Visual disturbance: no Chest pain: no Paresthesias: no Glucose Monitoring: yes  Accucheck frequency: BID  Fasting glucose: 91 this morning, yesterday 100  Post prandial:  Evening: 187 before bedtime last night  Before meals: Taking Insulin ?: yes  Long acting insulin : Tresiba  20 units  Short acting insulin : Blood Pressure Monitoring: not checking Retinal Examination: Up to Date Foot Exam: Up to Date Diabetic Education: Not Completed Pneumovax: Up to Date Influenza: Up to Date Aspirin : no   Relevant past medical, surgical, family and social history reviewed and updated as indicated. Interim medical history since our last visit reviewed. Allergies and medications reviewed and updated.  Review of Systems  Constitutional:  Negative for activity change, appetite change, diaphoresis, fatigue and fever.  Respiratory:  Positive for cough (occasional), shortness of breath (if takes O2 off) and wheezing. Negative for chest tightness.   Cardiovascular:  Negative for chest pain, palpitations and leg swelling.  Gastrointestinal: Negative.   Neurological: Negative.   Psychiatric/Behavioral: Negative.     Per HPI unless specifically indicated above      Objective:     BP 106/66   Pulse 96   Temp 98 F (36.7 C) (Oral)   Ht 5\' 7"  (1.702 m)   Wt 214 lb 9.6 oz (97.3 kg)   SpO2 92%   BMI 33.61 kg/m   Wt Readings from Last 3 Encounters:  09/01/23 214 lb 9.6 oz (97.3 kg)  08/17/23 217 lb (98.4 kg)  08/03/23 215 lb (97.5 kg)    Physical Exam Vitals and nursing note reviewed.  Constitutional:      General: She is awake. She is not in acute distress.    Appearance: She is well-developed and well-groomed. She is obese. She is not ill-appearing or toxic-appearing.  HENT:     Head: Normocephalic.     Right Ear: Hearing and external ear normal.     Left Ear: Hearing and external ear normal.  Eyes:     General: Lids are normal.        Right eye: No discharge.        Left eye: No discharge.     Conjunctiva/sclera: Conjunctivae normal.     Pupils: Pupils are equal, round, and reactive to light.  Neck:     Thyroid : No thyromegaly.     Vascular: No carotid bruit.  Cardiovascular:     Rate and Rhythm: Normal rate and regular rhythm.     Heart sounds: Normal heart sounds. No murmur heard.    No gallop.  Pulmonary:     Effort: Pulmonary effort is normal. No accessory muscle usage or respiratory distress.  Breath sounds: Decreased air movement (intermittent throughout) present. Wheezing (expiratory wheezes throughout) present. No decreased breath sounds or rales.     Comments: Baseline lung sounds on exam today. O2 on at 3L. Abdominal:     General: Bowel sounds are normal. There is no distension.     Palpations: Abdomen is soft.     Tenderness: There is no abdominal tenderness.  Musculoskeletal:     Cervical back: Normal range of motion and neck supple.     Right lower leg: No edema.     Left lower leg: No edema.  Lymphadenopathy:     Cervical: No cervical adenopathy.  Skin:    General: Skin is warm and dry.  Neurological:     Mental Status: She is alert and oriented to person, place, and time.     Deep Tendon Reflexes:  Reflexes are normal and symmetric.     Reflex Scores:      Brachioradialis reflexes are 2+ on the right side and 2+ on the left side.      Patellar reflexes are 2+ on the right side and 2+ on the left side. Psychiatric:        Attention and Perception: Attention normal.        Mood and Affect: Mood normal.        Speech: Speech normal.        Behavior: Behavior normal. Behavior is cooperative.        Thought Content: Thought content normal.     Results for orders placed or performed in visit on 08/03/23  Bayer DCA Hb A1c Waived   Collection Time: 08/03/23 11:15 AM  Result Value Ref Range   HB A1C (BAYER DCA - WAIVED) 8.6 (H) 4.8 - 5.6 %  Comprehensive metabolic panel with GFR   Collection Time: 08/03/23 11:15 AM  Result Value Ref Range   Glucose 166 (H) 70 - 99 mg/dL   BUN 9 8 - 27 mg/dL   Creatinine, Ser 7.82 (H) 0.57 - 1.00 mg/dL   eGFR 61 >95 AO/ZHY/8.65   BUN/Creatinine Ratio 9 (L) 12 - 28   Sodium 138 134 - 144 mmol/L   Potassium 4.7 3.5 - 5.2 mmol/L   Chloride 93 (L) 96 - 106 mmol/L   CO2 30 (H) 20 - 29 mmol/L   Calcium  9.5 8.7 - 10.3 mg/dL   Total Protein 6.3 6.0 - 8.5 g/dL   Albumin 4.2 3.9 - 4.9 g/dL   Globulin, Total 2.1 1.5 - 4.5 g/dL   Bilirubin Total 0.3 0.0 - 1.2 mg/dL   Alkaline Phosphatase 107 44 - 121 IU/L   AST 16 0 - 40 IU/L   ALT 9 0 - 32 IU/L  Lipid Panel w/o Chol/HDL Ratio   Collection Time: 08/03/23 11:15 AM  Result Value Ref Range   Cholesterol, Total 140 100 - 199 mg/dL   Triglycerides 784 (H) 0 - 149 mg/dL   HDL 42 >69 mg/dL   VLDL Cholesterol Cal 33 5 - 40 mg/dL   LDL Chol Calc (NIH) 65 0 - 99 mg/dL      Assessment & Plan:   Problem List Items Addressed This Visit       Cardiovascular and Mediastinum   Type 2 diabetes, controlled, with peripheral circulatory disorder (HCC) - Primary   Chronic, ongoing with A1c slowly trending down to 8.6% in May and Tresiba  was started.  Sugars are now trending down.  Urine ALB 80 January 2025.   Recommend she continue Olmesartan  daily  for kidney protection. In past she did not tolerate Metformin  at higher doses and did not tolerate GLP1 or SGLT2.  Check BS BID, bring to visit in 4 weeks.  Continue to collaborate with vascular.  - Continue Januvia  and Tresiba , recommend to stay at 20 units for now but if gets consistent lows is to cut back on dosage. - Continue to work with [facility] PharmD. - ARB and statin on board. - Vaccinations up to date. - Foot and eye exam up to date.        Follow up plan: Return in about 2 months (around 11/06/2023) for T2DM, HTN/HLD, COPD.

## 2023-09-02 DIAGNOSIS — J9611 Chronic respiratory failure with hypoxia: Secondary | ICD-10-CM | POA: Diagnosis not present

## 2023-09-02 DIAGNOSIS — J449 Chronic obstructive pulmonary disease, unspecified: Secondary | ICD-10-CM | POA: Diagnosis not present

## 2023-09-03 ENCOUNTER — Encounter: Payer: Self-pay | Admitting: Pulmonary Disease

## 2023-09-04 ENCOUNTER — Other Ambulatory Visit: Payer: Self-pay | Admitting: Nurse Practitioner

## 2023-09-05 NOTE — Telephone Encounter (Signed)
 Requested Prescriptions  Pending Prescriptions Disp Refills   DULoxetine  (CYMBALTA ) 60 MG capsule [Pharmacy Med Name: DULOXETINE  HCL 60 MG CAP] 90 capsule 1    Sig: TAKE 1 CAPSULE BY MOUTH DAILY     Psychiatry: Antidepressants - SNRI - duloxetine  Failed - 09/05/2023 12:19 PM      Failed - Cr in normal range and within 360 days    Creat  Date Value Ref Range Status  07/02/2013 0.79 0.50 - 1.10 mg/dL Final   Creatinine, Ser  Date Value Ref Range Status  08/03/2023 1.02 (H) 0.57 - 1.00 mg/dL Final         Passed - eGFR is 30 or above and within 360 days    GFR calc Af Amer  Date Value Ref Range Status  05/13/2020 86 >59 mL/min/1.73 Final    Comment:    **In accordance with recommendations from the NKF-ASN Task force,**   [facility] is in the process of updating its eGFR calculation to the   2021 CKD-EPI creatinine equation that estimates kidney function   without a race variable.    GFR, Estimated  Date Value Ref Range Status  04/21/2023 >60 >60 mL/min Final    Comment:    (NOTE) Calculated using the CKD-EPI Creatinine Equation (2021)    eGFR  Date Value Ref Range Status  08/03/2023 61 >59 mL/min/1.73 Final         Passed - Completed PHQ-2 or PHQ-9 in the last 360 days      Passed - Last BP in normal range    BP Readings from Last 1 Encounters:  09/01/23 106/66         Passed - Valid encounter within last 6 months    Recent Outpatient Visits           4 days ago Type 2 diabetes, controlled, with peripheral circulatory disorder (HCC)   [facility], Teller T, NP   1 month ago Type 2 diabetes, controlled, with peripheral circulatory disorder (HCC)   [place] [facility] [place], Sanjuan Crumbly T, NP   3 months ago Centrilobular emphysema [facility])   [facility] [place], Lavelle Posey, NP

## 2023-09-15 ENCOUNTER — Ambulatory Visit: Admitting: Pulmonary Disease

## 2023-09-27 ENCOUNTER — Other Ambulatory Visit: Payer: Self-pay

## 2023-09-27 NOTE — Progress Notes (Unsigned)
   09/27/2023  Patient ID: Stacey Gross, female   DOB: Aug 07, 1960, 63 y.o.   MRN: [id number]  Outreach attempt for scheduled telephone follow-up was unsuccessful; but I was able to leave HIPAA compliant voicemail with my direct phone number.  Routing to care guide to see if visit can be rescheduled.  Stacey Gross, PharmD, DPLA

## 2023-10-02 DIAGNOSIS — J449 Chronic obstructive pulmonary disease, unspecified: Secondary | ICD-10-CM | POA: Diagnosis not present

## 2023-10-02 DIAGNOSIS — J9611 Chronic respiratory failure with hypoxia: Secondary | ICD-10-CM | POA: Diagnosis not present

## 2023-10-04 ENCOUNTER — Telehealth: Admitting: Nurse Practitioner

## 2023-10-04 ENCOUNTER — Telehealth: Payer: Self-pay

## 2023-10-04 ENCOUNTER — Ambulatory Visit: Payer: Self-pay | Admitting: *Deleted

## 2023-10-04 ENCOUNTER — Encounter: Payer: Self-pay | Admitting: Nurse Practitioner

## 2023-10-04 DIAGNOSIS — J441 Chronic obstructive pulmonary disease with (acute) exacerbation: Secondary | ICD-10-CM | POA: Diagnosis not present

## 2023-10-04 MED ORDER — AMOXICILLIN-POT CLAVULANATE 875-125 MG PO TABS: 1.0000 | ORAL_TABLET | Freq: Two times a day (BID) | ORAL | 0 refills | Status: AC

## 2023-10-04 MED ORDER — PREDNISONE 20 MG PO TABS: 40.0000 mg | ORAL_TABLET | Freq: Every day | ORAL | 0 refills | Status: AC

## 2023-10-04 MED ORDER — HYDROCOD POLI-CHLORPHE POLI ER 10-8 MG/5ML PO SUER: 5.0000 mL | Freq: Every evening | ORAL | 0 refills | Status: DC | PRN

## 2023-10-04 NOTE — Progress Notes (Signed)
 There were no vitals taken for this visit.   Subjective:    Patient ID: Stacey Gross, female    DOB: 1960-11-25, 63 y.o.   MRN: [id number]  HPI: Stacey ALIG is a 63 y.o. female  Chief Complaint  Patient presents with   Cough    Patient states she has been having a cough, congestion, and SOB for the last 5 days   Shortness of Breath   Virtual Visit via Video Note  I connected with Stacey Gross on 10/04/23 at  4:00 PM EDT by a video enabled telemedicine application and verified that I am speaking with the correct person using two identifiers.  Location: Patient: home Provider: work   I discussed the limitations of evaluation and management by telemedicine and the availability of in person appointments. The patient expressed understanding and agreed to proceed.  I discussed the assessment and treatment plan with the patient. The patient was provided an opportunity to ask questions and all were answered. The patient agreed with the plan and demonstrated an understanding of the instructions.   The patient was advised to call back or seek an in-person evaluation if the symptoms worsen or if the condition fails to improve as anticipated.  I provided 25 minutes of non-face-to-face time during this encounter.   Emary Zalar T Jasier Calabretta, NP   COPD Has been feeling bad for 5 days, hit her like a ton of bricks.  Has been in bed and has reduced appetite.  Feeling weak.   COPD status: exacerbated Satisfied with current treatment?: yes Oxygen  use: yes 3 L Dyspnea frequency: increased Cough frequency: increased Rescue inhaler frequency:   Limitation of activity: yes Productive cough: yes Last Spirometry: 10/27/20 Pneumovax: Up to Date Influenza: Up to Date  Relevant past medical, surgical, family and social history reviewed and updated as indicated. Interim medical history since our last visit reviewed. Allergies and medications reviewed and updated.  Review of Systems   Constitutional:  Positive for appetite change, fatigue and fever. Negative for activity change and diaphoresis.  HENT:  Positive for congestion, postnasal drip and rhinorrhea. Negative for ear discharge, ear pain, sinus pressure, sinus pain and sore throat.   Respiratory:  Positive for cough, chest tightness, shortness of breath and wheezing.   Cardiovascular:  Negative for chest pain, palpitations and leg swelling.  Gastrointestinal: Negative.   Neurological:  Positive for headaches.  Psychiatric/Behavioral: Negative.     Per HPI unless specifically indicated above     Objective:    There were no vitals taken for this visit.  Wt Readings from Last 3 Encounters:  09/01/23 214 lb 9.6 oz (97.3 kg)  08/17/23 217 lb (98.4 kg)  08/03/23 215 lb (97.5 kg)    Physical Exam Vitals and nursing note reviewed.  Constitutional:      General: She is awake. She is not in acute distress.    Appearance: She is well-developed and well-groomed. She is obese. She is ill-appearing. She is not toxic-appearing.  HENT:     Head: Normocephalic.     Right Ear: Hearing normal.     Left Ear: Hearing normal.  Eyes:     General: Lids are normal.        Right eye: No discharge.        Left eye: No discharge.     Conjunctiva/sclera: Conjunctivae normal.  Pulmonary:     Effort: Pulmonary effort is normal. No accessory muscle usage or respiratory distress.     Comments: Hoarse  productive cough noted. Musculoskeletal:     Cervical back: Normal range of motion.  Neurological:     Mental Status: She is alert and oriented to person, place, and time.  Psychiatric:        Attention and Perception: Attention normal.        Mood and Affect: Mood normal.        Behavior: Behavior normal. Behavior is cooperative.        Thought Content: Thought content normal.        Judgment: Judgment normal.     Results for orders placed or performed in visit on 08/03/23  Bayer DCA Hb A1c Waived   Collection Time:  08/03/23 11:15 AM  Result Value Ref Range   HB A1C (BAYER DCA - WAIVED) 8.6 (H) 4.8 - 5.6 %  Comprehensive metabolic panel with GFR   Collection Time: 08/03/23 11:15 AM  Result Value Ref Range   Glucose 166 (H) 70 - 99 mg/dL   BUN 9 8 - 27 mg/dL   Creatinine, Ser 8.97 (H) 0.57 - 1.00 mg/dL   eGFR 61 >40 fO/fpw/8.26   BUN/Creatinine Ratio 9 (L) 12 - 28   Sodium 138 134 - 144 mmol/L   Potassium 4.7 3.5 - 5.2 mmol/L   Chloride 93 (L) 96 - 106 mmol/L   CO2 30 (H) 20 - 29 mmol/L   Calcium  9.5 8.7 - 10.3 mg/dL   Total Protein 6.3 6.0 - 8.5 g/dL   Albumin 4.2 3.9 - 4.9 g/dL   Globulin, Total 2.1 1.5 - 4.5 g/dL   Bilirubin Total 0.3 0.0 - 1.2 mg/dL   Alkaline Phosphatase 107 44 - 121 IU/L   AST 16 0 - 40 IU/L   ALT 9 0 - 32 IU/L  Lipid Panel w/o Chol/HDL Ratio   Collection Time: 08/03/23 11:15 AM  Result Value Ref Range   Cholesterol, Total 140 100 - 199 mg/dL   Triglycerides 798 (H) 0 - 149 mg/dL   HDL 42 >60 mg/dL   VLDL Cholesterol Cal 33 5 - 40 mg/dL   LDL Chol Calc (NIH) 65 0 - 99 mg/dL      Assessment & Plan:   Problem List Items Addressed This Visit       Respiratory   COPD exacerbation (HCC) - Primary   Acute for 5 days with no improvement.  Continue 3L oxygen  at home and inhaler regimen.  Will treat with Augmentin  BID for 7 days and Prednisone  40 MG daily for 5 days. She is aware to monitor sugars closely while taking this and alert provider if >400 + stop taking. Tussionex sent in for cough relief.  She is aware if any worsening she is immediately to go to ER.  Refused to go today. - Increased rest - Increasing Fluids - Acetaminophen  as needed for fever/pain.  - Mucinex .  - Humidifying the air.       Relevant Medications   predniSONE  (DELTASONE ) 20 MG tablet   chlorpheniramine-HYDROcodone  (TUSSIONEX) 10-8 MG/5ML     Follow up plan: Return in about 1 week (around 10/11/2023) for COPD.

## 2023-10-04 NOTE — Assessment & Plan Note (Signed)
 Acute for 5 days with no improvement.  Continue 3L oxygen  at home and inhaler regimen.  Will treat with Augmentin  BID for 7 days and Prednisone  40 MG daily for 5 days. She is aware to monitor sugars closely while taking this and alert provider if >400 + stop taking. Tussionex sent in for cough relief.  She is aware if any worsening she is immediately to go to ER.  Refused to go today. - Increased rest - Increasing Fluids - Acetaminophen  as needed for fever/pain.  - Mucinex .  - Humidifying the air.

## 2023-10-04 NOTE — Telephone Encounter (Signed)
 Appointment scheduled.

## 2023-10-04 NOTE — Patient Instructions (Signed)
 COPD Exacerbation Learn about triggers that can make COPD worse and how to avoid them. To view the content, go to this web address: [email or web address]  This video will expire on: 08/28/2025. If you need access to this video following this date, please reach out to the healthcare provider who assigned it to you. This information is not intended to replace advice given to you by your health care provider. Make sure you discuss any questions you have with your health care provider. Elsevier Patient Education  [employer].

## 2023-10-04 NOTE — Telephone Encounter (Signed)
 Copied from CRM [id number]. Topic: Clinical - Medication Question >> Oct 03, 2023  4:22 PM Sasha H wrote: Reason for CRM: Pt's sister is wanting a call back about prescribing the pt antibiotics

## 2023-10-04 NOTE — Telephone Encounter (Signed)
 FYI Only or Action Required?: FYI only for provider.  Patient was last seen in primary care on 09/01/2023 by Valerio Melanie DASEN, NP. Called Nurse Triage reporting Breathing Problem. Symptoms began a week ago. Interventions attempted: Rest, hydration, or home remedies. Symptoms are: gradually worsening.  Triage Disposition: Call EMS 911 Now  Patient/caregiver understands and will follow disposition?: Patient declines- office alerted  Copied from CRM [id number]. Topic: Clinical - Red Word Triage >> Oct 04, 2023  8:14 AM Rosaria BRAVO wrote: Red Word that prompted transfer to Nurse Triage: Difficulty breathing, possible upper respiratory infection. Reason for Disposition  SEVERE difficulty breathing (e.g., struggling for each breath, speaks in single words)  Answer Assessment - Initial Assessment Questions 1. RESPIRATORY STATUS: Describe your breathing? (e.g., wheezing, shortness of breath, unable to speak, severe coughing)      Patient states if she talks too much she has to stop because she can't breath- cough congestion 2. ONSET: When did this breathing problem begin?      5 days ago 3. PATTERN Does the difficult breathing come and go, or has it been constant since it started?      O2 use- continuously- not helping 4. SEVERITY: How bad is your breathing? (e.g., mild, moderate, severe)    - MILD: No SOB at rest, mild SOB with walking, speaks normally in sentences, can lie down, no retractions, pulse < 100.    - MODERATE: SOB at rest, SOB with minimal exertion and prefers to sit, cannot lie down flat, speaks in phrases, mild retractions, audible wheezing, pulse 100-120.    - SEVERE: Very SOB at rest, speaks in single words, struggling to breathe, sitting hunched forward, retractions, pulse > 120      severe 5. RECURRENT SYMPTOM: Have you had difficulty breathing before? If Yes, ask: When was the last time? and What happened that time?      yes 6. CARDIAC HISTORY: Do you have any  history of heart disease? (e.g., heart attack, angina, bypass surgery, angioplasty)      PVD 7. LUNG HISTORY: Do you have any history of lung disease?  (e.g., pulmonary embolus, asthma, emphysema)     Respiratory failure 8. CAUSE: What do you think is causing the breathing problem?      congestion 9. OTHER SYMPTOMS: Do you have any other symptoms? (e.g., dizziness, runny nose, cough, chest pain, fever)     Cough, chest congetsion 10. O2 SATURATION MONITOR:  Do you use an oxygen  saturation monitor (pulse oximeter) at home? If Yes, ask: What is your reading (oxygen  level) today? What is your usual oxygen  saturation reading? (e.g., 95%)       88%, P 106   Patient declines ED - patient states she owes too many bills- and she will not go- she just wants Rx form provider. Patient is on oxygen - but states she is getting panicky regarding that. Patient has been in bed for 2 days and has not eaten. Her sister is with her today.  Protocols used: Breathing Difficulty-A-AH

## 2023-10-09 ENCOUNTER — Other Ambulatory Visit: Payer: Self-pay

## 2023-10-09 NOTE — Progress Notes (Signed)
   10/09/2023  Patient ID: Stacey Gross, female   DOB: 16-Dec-1960, 63 y.o.   MRN: [id number]  Diabetes -Current medications:  Januvia  100mg  daily, Tresiba  20 units at bedtime -Did not tolerate metformin  (significant diarrhea), GLP1's (significant GI upset), or Farxiga  (yeast infections) in the past -Patient does monitor home FBG and endorses values averaging 100-105 -A1c in May of 8.6%- down from 9.5% prior -Does not endorse any s/sx of hypoglycemia -Endorses affordability of Tresiba  and all medications, stating she does not have copays for her prescriptions  COPD -Current medications:  albuterol  HFA as needed, Bevespi  2 puffs daily -Current exacerbation that is being treated with Augmentin  875/125mg  BID x7d and prednisone  40mg  daily x5d -Patient endorses improvements in symptoms since beginning abx and steroid -Good adherence to Bevespi  -Did not tolerate Breztri  (thrush) or albuterol  nebulizer solution (mouth blisters) due in the past   Assessment/Plan   Diabetes -Home BG readings reflect great improvement in control -Due for A1c in August -Continue current regimen at this time and regular monitoring of FBG  COPD -Complete 7 day course of abx and 5 day course of steroid -Continue Bevespi  2 puffs daily and albuterol  HFA as needed -Sees PCP for follow-up 7/15   Follow-up:  3 months   Stacey Gross, PharmD, DPLA

## 2023-10-15 NOTE — Patient Instructions (Signed)
 Be Involved in Caring For Your Health:  Taking Medications When medications are taken as directed, they can greatly improve your health. But if they are not taken as prescribed, they may not work. In some cases, not taking them correctly can be harmful. To help ensure your treatment remains effective and safe, understand your medications and how to take them. Bring your medications to each visit for review by your provider.  Your lab results, notes, and after visit summary will be available on My Chart. We strongly encourage you to use this feature. If lab results are abnormal the clinic will contact you with the appropriate steps. If the clinic does not contact you assume the results are satisfactory. You can always view your results on My Chart. If you have questions regarding your health or results, please contact the clinic during office hours. You can also ask questions on My Chart.  We at [facility] are grateful that you chose us  to provide your care. We strive to provide evidence-based and compassionate care and are always looking for feedback. If you get a survey from the clinic please complete this so we can hear your opinions.  COPD Exacerbation Learn about triggers that can make COPD worse and how to avoid them. To view the content, go to this web address: [email or web address]  This video will expire on: 08/28/2025. If you need access to this video following this date, please reach out to the healthcare provider who assigned it to you. This information is not intended to replace advice given to you by your health care provider. Make sure you discuss any questions you have with your health care provider. Elsevier Patient Education  [employer].

## 2023-10-17 ENCOUNTER — Encounter: Payer: Self-pay | Admitting: Nurse Practitioner

## 2023-10-17 ENCOUNTER — Ambulatory Visit (INDEPENDENT_AMBULATORY_CARE_PROVIDER_SITE_OTHER): Admitting: Nurse Practitioner

## 2023-10-17 VITALS — BP 111/68 | HR 90 | Temp 98.7°F | Ht 67.0 in | Wt 219.0 lb

## 2023-10-17 DIAGNOSIS — J441 Chronic obstructive pulmonary disease with (acute) exacerbation: Secondary | ICD-10-CM | POA: Diagnosis not present

## 2023-10-17 DIAGNOSIS — F411 Generalized anxiety disorder: Secondary | ICD-10-CM | POA: Diagnosis not present

## 2023-10-17 MED ORDER — GLUCOSE BLOOD VI STRP: ORAL_STRIP | 12 refills | Status: AC

## 2023-10-17 MED ORDER — BUSPIRONE HCL 5 MG PO TABS: 5.0000 mg | ORAL_TABLET | Freq: Two times a day (BID) | ORAL | 3 refills | Status: AC

## 2023-10-17 MED ORDER — DULOXETINE HCL 60 MG PO CPEP: 60.0000 mg | ORAL_CAPSULE | Freq: Every day | ORAL | 3 refills | Status: AC

## 2023-10-17 MED ORDER — INSULIN DEGLUDEC 100 UNIT/ML ~~LOC~~ SOPN: 10.0000 [IU] | PEN_INJECTOR | Freq: Every day | SUBCUTANEOUS | 12 refills | Status: DC

## 2023-10-17 MED ORDER — PANTOPRAZOLE SODIUM 40 MG PO TBEC: 40.0000 mg | DELAYED_RELEASE_TABLET | Freq: Every day | ORAL | 3 refills | Status: AC

## 2023-10-17 MED ORDER — FLUCONAZOLE 150 MG PO TABS: 150.0000 mg | ORAL_TABLET | Freq: Every day | ORAL | 0 refills | Status: DC

## 2023-10-17 MED ORDER — FUROSEMIDE 20 MG PO TABS: 20.0000 mg | ORAL_TABLET | Freq: Every day | ORAL | 3 refills | Status: DC | PRN

## 2023-10-17 MED ORDER — PREGABALIN 50 MG PO CAPS: 50.0000 mg | ORAL_CAPSULE | Freq: Two times a day (BID) | ORAL | 1 refills | Status: DC

## 2023-10-17 MED ORDER — AMOXICILLIN-POT CLAVULANATE 875-125 MG PO TABS: 1.0000 | ORAL_TABLET | Freq: Two times a day (BID) | ORAL | 0 refills | Status: AC

## 2023-10-17 MED ORDER — PEN NEEDLES 31G X 8 MM MISC: 2 refills | Status: AC

## 2023-10-17 NOTE — Assessment & Plan Note (Addendum)
 Acute with some improvement, but not 100%.  Continue 3L oxygen  at home and inhaler regimen.  Will extend Augmentin  BID for 3 more days. Continue Tussionex PRN.  She is aware if any worsening she is immediately to go to ER.  Refuses to return for follow-up next week with another provider, since PCP will be out of office.  Highly recommended she return for lung check, refuses.  She reports she will return in August as scheduled.  Advised her if any worsening to come to office or go to ER. - Increased rest - Increasing Fluids - Acetaminophen  as needed for fever/pain.  - Mucinex .  - Humidifying the air.  - Diflucan  sent in for yeast with abx therapy, advised her to try to hold Albuterol  inhaler for 24 hours after taking this.

## 2023-10-17 NOTE — Progress Notes (Signed)
 BP 111/68   Pulse 90   Temp 98.7 F (37.1 C) (Oral)   Ht 5' 7 (1.702 m)   Wt 219 lb (99.3 kg)   SpO2 92%   BMI 34.30 kg/m    Subjective:    Patient ID: Stacey Gross, female    DOB: May 09, 1960, 63 y.o.   MRN: [id number]  HPI: Stacey Gross is a 63 y.o. female  Chief Complaint  Patient presents with   COPD   COPD Follow-up today for COPD exacerbation, treated with Prednisone  and Augmentin  on 10/04/23. Improving a little bit, but 100%.  Still has some junk in her.  Productive. Continues to wheeze.  No fever.  Saw pulmonary last on 08/17/23.  Having more irritability over the past month and would like mood medication adjusted. Also needs refills on some medications. COPD status: improving Satisfied with current treatment?: yes Oxygen  use: yes Dyspnea frequency: ongoing, a little worse with exacerbation Cough frequency: ongoing, a little worse with exacerbation Rescue inhaler frequency:  daily Limitation of activity: yes Productive cough: yes Last Spirometry: pulmonary Pneumovax: Up to Date Influenza: Up to Date     06/27/2023   11:26 AM 05/04/2023   10:01 AM 02/01/2023    9:53 AM 09/01/2022    9:51 AM 08/02/2022    9:44 AM  Depression screen PHQ 2/9  Decreased Interest 3 0 3 0 2  Down, Depressed, Hopeless 1 0 3 0 2  PHQ - 2 Score 4 0 6 0 4  Altered sleeping 2 0 3 2 2   Tired, decreased energy 3 0 3 1 2   Change in appetite 3 0 3 1 0  Feeling bad or failure about yourself  0 0 0 0 0  Trouble concentrating 0 0 0 0 0  Moving slowly or fidgety/restless 0 0 1 0 0  Suicidal thoughts 0 0 0 0 0  PHQ-9 Score 12 0 16 4 8   Difficult doing work/chores Very difficult Not difficult at all Somewhat difficult Not difficult at all Somewhat difficult       05/04/2023   10:01 AM 02/01/2023    9:54 AM 09/01/2022    9:52 AM 08/02/2022    9:45 AM  GAD 7 : Generalized Anxiety Score  Nervous, Anxious, on Edge 0 1 0 2  Control/stop worrying 0 1 0 2  Worry too much - different  things 0 1 1 2   Trouble relaxing 0 0 0 0  Restless 0 0 0 0  Easily annoyed or irritable 0 2 2 2   Afraid - awful might happen 0 0 0 2  Total GAD 7 Score 0 5 3 10   Anxiety Difficulty Not difficult at all Somewhat difficult Not difficult at all Somewhat difficult   Relevant past medical, surgical, family and social history reviewed and updated as indicated. Interim medical history since our last visit reviewed. Allergies and medications reviewed and updated.  Review of Systems  Constitutional:  Positive for fatigue. Negative for activity change, appetite change, diaphoresis and fever.  HENT:  Positive for congestion, postnasal drip, rhinorrhea and sinus pressure. Negative for ear discharge, ear pain, sinus pain, sneezing, sore throat and voice change.   Respiratory:  Positive for cough, chest tightness, shortness of breath and wheezing.   Cardiovascular:  Negative for chest pain, palpitations and leg swelling.  Gastrointestinal: Negative.   Neurological: Negative.   Psychiatric/Behavioral:  Positive for sleep disturbance. Negative for decreased concentration, self-injury and suicidal ideas. The patient is nervous/anxious.    Per  HPI unless specifically indicated above     Objective:    BP 111/68   Pulse 90   Temp 98.7 F (37.1 C) (Oral)   Ht 5' 7 (1.702 m)   Wt 219 lb (99.3 kg)   SpO2 92%   BMI 34.30 kg/m   Wt Readings from Last 3 Encounters:  10/17/23 219 lb (99.3 kg)  09/01/23 214 lb 9.6 oz (97.3 kg)  08/17/23 217 lb (98.4 kg)    Physical Exam Vitals and nursing note reviewed.  Constitutional:      General: She is awake. She is not in acute distress.    Appearance: She is well-developed and well-groomed. She is obese. She is not ill-appearing or toxic-appearing.     Comments: Appears with improved appearance from previous visit.  HENT:     Head: Normocephalic.     Right Ear: Hearing, ear canal and external ear normal. A middle ear effusion is present. There is no  impacted cerumen. Tympanic membrane is not injected.     Left Ear: Hearing, ear canal and external ear normal. A middle ear effusion is present. There is no impacted cerumen. Tympanic membrane is not injected.     Nose: Nose normal. No rhinorrhea.     Right Sinus: No maxillary sinus tenderness or frontal sinus tenderness.     Left Sinus: No maxillary sinus tenderness or frontal sinus tenderness.     Mouth/Throat:     Mouth: Mucous membranes are moist.     Pharynx: Posterior oropharyngeal erythema (mild) present. No pharyngeal swelling or oropharyngeal exudate.  Eyes:     General: Lids are normal.        Right eye: No discharge.        Left eye: No discharge.     Conjunctiva/sclera: Conjunctivae normal.     Pupils: Pupils are equal, round, and reactive to light.  Neck:     Thyroid : No thyromegaly.     Vascular: No carotid bruit.  Cardiovascular:     Rate and Rhythm: Normal rate and regular rhythm.     Heart sounds: Normal heart sounds. No murmur heard.    No gallop.  Pulmonary:     Effort: Pulmonary effort is normal. No accessory muscle usage or respiratory distress.     Breath sounds: Wheezing present. No decreased breath sounds or rales.     Comments: Intermittent expiratory wheezes throughout. Baseline. Abdominal:     General: Bowel sounds are normal. There is no distension.     Palpations: Abdomen is soft.     Tenderness: There is no abdominal tenderness.  Musculoskeletal:     Cervical back: Normal range of motion and neck supple.     Right lower leg: No edema.     Left lower leg: No edema.  Lymphadenopathy:     Head:     Right side of head: No submental, submandibular, tonsillar, preauricular, posterior auricular or occipital adenopathy.     Left side of head: No submental, submandibular, tonsillar, preauricular, posterior auricular or occipital adenopathy.     Cervical: No cervical adenopathy.  Skin:    General: Skin is warm and dry.  Neurological:     Mental Status: She  is alert and oriented to person, place, and time.     Deep Tendon Reflexes: Reflexes are normal and symmetric.     Reflex Scores:      Brachioradialis reflexes are 2+ on the right side and 2+ on the left side.      Patellar  reflexes are 2+ on the right side and 2+ on the left side. Psychiatric:        Attention and Perception: Attention normal.        Mood and Affect: Mood normal.        Speech: Speech normal.        Behavior: Behavior normal. Behavior is cooperative.        Thought Content: Thought content normal.    Results for orders placed or performed in visit on 08/03/23  Bayer DCA Hb A1c Waived   Collection Time: 08/03/23 11:15 AM  Result Value Ref Range   HB A1C (BAYER DCA - WAIVED) 8.6 (H) 4.8 - 5.6 %  Comprehensive metabolic panel with GFR   Collection Time: 08/03/23 11:15 AM  Result Value Ref Range   Glucose 166 (H) 70 - 99 mg/dL   BUN 9 8 - 27 mg/dL   Creatinine, Ser 8.97 (H) 0.57 - 1.00 mg/dL   eGFR 61 >40 fO/fpw/8.26   BUN/Creatinine Ratio 9 (L) 12 - 28   Sodium 138 134 - 144 mmol/L   Potassium 4.7 3.5 - 5.2 mmol/L   Chloride 93 (L) 96 - 106 mmol/L   CO2 30 (H) 20 - 29 mmol/L   Calcium  9.5 8.7 - 10.3 mg/dL   Total Protein 6.3 6.0 - 8.5 g/dL   Albumin 4.2 3.9 - 4.9 g/dL   Globulin, Total 2.1 1.5 - 4.5 g/dL   Bilirubin Total 0.3 0.0 - 1.2 mg/dL   Alkaline Phosphatase 107 44 - 121 IU/L   AST 16 0 - 40 IU/L   ALT 9 0 - 32 IU/L  Lipid Panel w/o Chol/HDL Ratio   Collection Time: 08/03/23 11:15 AM  Result Value Ref Range   Cholesterol, Total 140 100 - 199 mg/dL   Triglycerides 798 (H) 0 - 149 mg/dL   HDL 42 >60 mg/dL   VLDL Cholesterol Cal 33 5 - 40 mg/dL   LDL Chol Calc (NIH) 65 0 - 99 mg/dL      Assessment & Plan:   Problem List Items Addressed This Visit       Respiratory   COPD exacerbation (HCC) - Primary   Acute with some improvement, but not 100%.  Continue 3L oxygen  at home and inhaler regimen.  Will extend Augmentin  BID for 3 more days. Continue  Tussionex PRN.  She is aware if any worsening she is immediately to go to ER.  Refuses to return for follow-up next week with another provider, since PCP will be out of office.  Highly recommended she return for lung check, refuses.  She reports she will return in August as scheduled.  Advised her if any worsening to come to office or go to ER. - Increased rest - Increasing Fluids - Acetaminophen  as needed for fever/pain.  - Mucinex .  - Humidifying the air.  - Diflucan  sent in for yeast with abx therapy, advised her to try to hold Albuterol  inhaler for 24 hours after taking this.        Other   Generalized anxiety disorder   Chronic, exacerbated. Denies SI/HI.  Will continue Duloxetine  60 MG since is offering benefit to mood and chronic back pain, but add on Buspar  5 MG BID which may benefit anxiety and irritability.  Educated her on this medication and change + side effects and BLACK BOX warning.        Relevant Medications   busPIRone  (BUSPAR ) 5 MG tablet   DULoxetine  (CYMBALTA ) 60 MG capsule  Time: 25 minutes, >50% spent counseling/or care coordination   Follow up plan: Return for as scheduled August 15th.

## 2023-10-17 NOTE — Assessment & Plan Note (Signed)
 Chronic, exacerbated. Denies SI/HI.  Will continue Duloxetine  60 MG since is offering benefit to mood and chronic back pain, but add on Buspar  5 MG BID which may benefit anxiety and irritability.  Educated her on this medication and change + side effects and BLACK BOX warning.

## 2023-10-18 ENCOUNTER — Telehealth: Payer: Self-pay

## 2023-10-18 MED ORDER — INSULIN DEGLUDEC 100 UNIT/ML ~~LOC~~ SOPN: 20.0000 [IU] | PEN_INJECTOR | Freq: Every day | SUBCUTANEOUS | 12 refills | Status: AC

## 2023-10-18 NOTE — Telephone Encounter (Signed)
 Routing to provider. Can we change the directions to 20 units and resend to the pharmacy?

## 2023-10-18 NOTE — Telephone Encounter (Signed)
 Copied from CRM [id number]. Topic: Clinical - Prescription Issue >> Oct 18, 2023  3:34 PM Essie A wrote: Reason for CRM: Patient and daughter called regarding insulin  degludec (TRESIBA ) 100 UNIT/ML FlexTouch Pen.  The prescription should say 20 units instead of 10 units.  Please call pharmacy to make a change and let patient know that the change has been made at [phone].

## 2023-10-19 MED ORDER — NYSTATIN 100000 UNIT/ML MT SUSP: 5.0000 mL | Freq: Four times a day (QID) | OROMUCOSAL | 0 refills | Status: DC

## 2023-10-19 NOTE — Telephone Encounter (Signed)
 Called and notified patient that her prescription has been corrected. Patient also requests a refill on nystatin  solution for thrush.

## 2023-10-19 NOTE — Addendum Note (Signed)
 Addended by: NELWYN LAYMON SAILOR on: 10/19/2023 09:17 AM   Modules accepted: Orders

## 2023-11-02 DIAGNOSIS — J449 Chronic obstructive pulmonary disease, unspecified: Secondary | ICD-10-CM | POA: Diagnosis not present

## 2023-11-02 DIAGNOSIS — J9611 Chronic respiratory failure with hypoxia: Secondary | ICD-10-CM | POA: Diagnosis not present

## 2023-11-11 NOTE — Patient Instructions (Signed)
Be Involved in Caring For Your Health:  Taking Medications When medications are taken as directed, they can greatly improve your health. But if they are not taken as prescribed, they may not work. In some cases, not taking them correctly can be harmful. To help ensure your treatment remains effective and safe, understand your medications and how to take them. Bring your medications to each visit for review by your provider.  Your lab results, notes, and after visit summary will be available on My Chart. We strongly encourage you to use this feature. If lab results are abnormal the clinic will contact you with the appropriate steps. If the clinic does not contact you assume the results are satisfactory. You can always view your results on My Chart. If you have questions regarding your health or results, please contact the clinic during office hours. You can also ask questions on My Chart.  We at [facility] are grateful that you chose [place] to provide your care. We strive to provide evidence-based and compassionate care and are always looking for feedback. If you get a survey from the clinic please complete this so we can hear your opinions.  Diabetes Mellitus and Exercise Regular exercise is important for your health, especially if you have diabetes mellitus. Exercise is not just about losing weight. It can also help you increase muscle strength and bone density and reduce body fat and stress. This can help your level of endurance and make you more fit and flexible. Why should I exercise if I have diabetes? Exercise has many benefits for people with diabetes. It can: Help lower and control your blood sugar (glucose). Help your body respond better and become more sensitive to the hormone insulin. Reduce how much insulin your body needs. Lower your risk for heart disease by: Lowering how much "bad" cholesterol and triglycerides you have in your body. Increasing how much "good" cholesterol  you have in your body. Lowering your blood pressure. Lowering your blood glucose levels. What is my activity plan? Your health care provider or an expert trained in diabetes care (certified diabetes educator) can help you make an activity plan. This plan can help you find the type of exercise that works for you. It may also tell you how often to exercise and for how long. Be sure to: Get at least 150 minutes of medium-intensity or high-intensity exercise each week. This may involve brisk walking, biking, or water aerobics. Do stretching and strengthening exercises at least 2 times a week. This may involve yoga or weight lifting. Spread out your activity over at least 3 days of the week. Get some form of physical activity each day. Do not go more than 2 days in a row without some kind of activity. Avoid being inactive for more than 30 minutes at a time. Take frequent breaks to walk or stretch. Choose activities that you enjoy. Set goals that you know you can accomplish. Start slowly and increase the intensity of your exercise over time. How do I manage my diabetes during exercise?  Monitor your blood glucose Check your blood glucose before and after you exercise. If your blood glucose is 240 mg/dL (40.9 mmol/L) or higher before you exercise, check your urine for ketones. These are chemicals created by the liver. If you have ketones in your urine, do not exercise until your blood glucose returns to normal. If your blood glucose is 100 mg/dL (5.6 mmol/L) or lower, eat a snack that has 15-20 grams of carbohydrate in  it. Check your blood glucose 15 minutes after the snack to make sure that your level is above 100 mg/dL (5.6 mmol/L) before you start to exercise. Your risk for low blood glucose (hypoglycemia) goes up during and after exercise. Know the symptoms of this condition and how to treat it. Follow these instructions at home: Keep a carbohydrate snack on hand for use before, during, and after  exercise. This can help prevent or treat hypoglycemia. Avoid injecting insulin into parts of your body that are going to be used during exercise. This may include: Your arms, when you are going to play tennis. Your legs, when you are about to go jogging. Keep track of your exercise habits. This can help you and your health care provider watch and adjust your activity plan. Write down: What you eat before and after you exercise. Blood glucose levels before and after you exercise. The type and amount of exercise you do. Talk to your health care provider before you start a new activity. They may need to: Make sure that the activity is safe for you. Adjust your insulin, other medicines, and food that you eat. Drink water while you exercise. This can stop you from losing too much water (dehydration). It can also prevent problems caused by having a lot of heat in your body (heat stroke). Where to find more information American Diabetes Association: diabetes.org Association of Diabetes Care & Education Specialists: diabeteseducator.org This information is not intended to replace advice given to you by your health care provider. Make sure you discuss any questions you have with your health care provider. Document Revised: 09/08/2021 Document Reviewed: 09/08/2021 Elsevier Patient Education  2024 [employer].

## 2023-11-17 ENCOUNTER — Encounter: Payer: Self-pay | Admitting: Nurse Practitioner

## 2023-11-17 ENCOUNTER — Ambulatory Visit: Admitting: Nurse Practitioner

## 2023-11-17 ENCOUNTER — Telehealth: Payer: Self-pay

## 2023-11-17 VITALS — BP 117/76 | HR 83 | Temp 98.9°F | Ht 67.0 in | Wt 225.0 lb

## 2023-11-17 DIAGNOSIS — E1169 Type 2 diabetes mellitus with other specified complication: Secondary | ICD-10-CM

## 2023-11-17 DIAGNOSIS — F17219 Nicotine dependence, cigarettes, with unspecified nicotine-induced disorders: Secondary | ICD-10-CM | POA: Diagnosis not present

## 2023-11-17 DIAGNOSIS — D692 Other nonthrombocytopenic purpura: Secondary | ICD-10-CM | POA: Diagnosis not present

## 2023-11-17 DIAGNOSIS — I5032 Chronic diastolic (congestive) heart failure: Secondary | ICD-10-CM | POA: Diagnosis not present

## 2023-11-17 DIAGNOSIS — E1151 Type 2 diabetes mellitus with diabetic peripheral angiopathy without gangrene: Secondary | ICD-10-CM

## 2023-11-17 DIAGNOSIS — E1159 Type 2 diabetes mellitus with other circulatory complications: Secondary | ICD-10-CM | POA: Diagnosis not present

## 2023-11-17 DIAGNOSIS — I739 Peripheral vascular disease, unspecified: Secondary | ICD-10-CM

## 2023-11-17 DIAGNOSIS — E785 Hyperlipidemia, unspecified: Secondary | ICD-10-CM | POA: Diagnosis not present

## 2023-11-17 DIAGNOSIS — E66811 Obesity, class 1: Secondary | ICD-10-CM

## 2023-11-17 DIAGNOSIS — R9389 Abnormal findings on diagnostic imaging of other specified body structures: Secondary | ICD-10-CM | POA: Diagnosis not present

## 2023-11-17 DIAGNOSIS — J9611 Chronic respiratory failure with hypoxia: Secondary | ICD-10-CM

## 2023-11-17 DIAGNOSIS — J432 Centrilobular emphysema: Secondary | ICD-10-CM | POA: Diagnosis not present

## 2023-11-17 DIAGNOSIS — F411 Generalized anxiety disorder: Secondary | ICD-10-CM

## 2023-11-17 LAB — BAYER DCA HB A1C WAIVED: HB A1C (BAYER DCA - WAIVED): 6.9 % — ABNORMAL HIGH (ref 4.8–5.6)

## 2023-11-17 MED ORDER — FUROSEMIDE 20 MG PO TABS: 40.0000 mg | ORAL_TABLET | Freq: Every day | ORAL | 3 refills | Status: AC

## 2023-11-17 MED ORDER — ACCU-CHEK GUIDE ME W/DEVICE KIT: PACK | 0 refills | Status: AC

## 2023-11-17 NOTE — Assessment & Plan Note (Addendum)
 Chronic, stable.  BP well below goal in office today and at home.  Continue Olmesartan  5 MG daily for kidney protection and Verapamil  80 MG daily + Lasix .  Recommend she monitor BP at least a few mornings a week at home and document.  DASH diet at home.  Labs today: CMP.

## 2023-11-17 NOTE — Assessment & Plan Note (Signed)
 Chronic, ongoing with ongoing productive cough and not feeling 100%. ?if more related to HF. - Will continue Bevespi  to allow for LABA/LAMA on board.  Discussed at length with her.  Continue Albuterol  as needed.   - Recommend complete cessation of smoking.   - Continue annual CT scans.   - Continue collaboration with pulmonary, recent note reviewed.   - Continue to wear oxygen  consistently at 3L [place].  Stacey Gross

## 2023-11-17 NOTE — Assessment & Plan Note (Signed)
 Chronic, stable, followed by vascular.  Continue collaboration, recent note reviewed.  Continue statin and ASA daily.  Refer to blue toe syndrome plan of care.

## 2023-11-17 NOTE — Assessment & Plan Note (Signed)
 Chronic, exacerbated. Denies SI/HI.  Will continue Duloxetine  60 MG and  Buspar  5 MG BID which offers benefit anxiety and irritability.  Educated her on this medication and change + side effects and BLACK BOX warning.

## 2023-11-17 NOTE — Progress Notes (Signed)
 BP 117/76   Pulse 83   Temp 98.9 F (37.2 C) (Oral)   Ht 5' 7 (1.702 m)   Wt 225 lb (102.1 kg)   SpO2 96%   BMI 35.24 kg/m    Subjective:    Patient ID: Stacey Gross, female    DOB: 1960/11/04, 63 y.o.   MRN: [id number]  HPI: Stacey Gross is a 63 y.o. female  Chief Complaint  Patient presents with   COPD   Diabetes   Hyperlipidemia   Hypertension   Friend, Newell, at bedside to assist with HPI.  DIABETES A1c May was 8.6%.  Taking Januvia  and Tresiba .   HISTORY: Metformin  caused increase in bowel movements and could not leave house. GLP1 medications caused major stomach issues and Farxiga  caused yeast infections.  Hypoglycemic episodes:no Polydipsia/polyuria: no Visual disturbance: no Chest pain: no Paresthesias: no Glucose Monitoring: yes             Accucheck frequency: daily             Fasting glucose: 110 range             Post prandial:             Evening: 200 range             Before meals: Taking Insulin ?: no             Long acting insulin : 20 units Tresiba              Short acting insulin : Blood Pressure Monitoring: not checking Retinal Examination: Up To Date -- [place] Foot Exam: Up to Date Pneumovax: Up to Date Influenza: Not Up to Date -- refuses  Aspirin : no    HYPERTENSION / HYPERLIPIDEMIA/CHF Continues Olmesartan  5 MG, Verapamil  80 MG daily, Lasix  20 MG, and Crestor  40 MG daily. EF 05/02/23 was 60-65%. Saw cardiology on 04/18/23 for her HF, they are requesting a second opinion as she is still having episodes of her heart pounding quickly. Has been getting more SOB walking 20 to 30 feet. At baseline sleeps on 3 pillows at night. Has been eating salami every day recently, 5 slices + salty popcorn.    Saw vascular [facility] for second opinion on PVD/blue toe syndrome on 07/12/23 with no changes and there is nothing they can do for her syndrome. Her toes occasionally turn blue and blister, then clear up.   Satisfied with current treatment?  yes Duration of hypertension: chronic BP monitoring frequency: daily BP range: 117/70 recently BP medication side effects: no Duration of hyperlipidemia: chronic Cholesterol medication side effects: no Cholesterol supplements: none Medication compliance: good compliance Aspirin : no Recent stressors: no Recurrent headaches: no Visual changes: no Palpitations: occasional Dyspnea: yes at baseline Chest pain: recent episode GERD all night Lower extremity edema: occasional Dizzy/lightheaded: if stands up from sitting too quickly  COPD Is using Bevespi  and Albuterol .  Uses Albuterol  every night.  Saw pulmonary on 06/15/23 and they treated for exacerbation, she reports that she is still not feeling 100%.  Uses oxygen  every day - 3 L.  Continues to smoke nicotine , 3/4 to 1/2 PPD.  Not interested in quitting -- has been smoking since age 13.  Lung CT screening 04/27/23 == emphysema and aortic atherosclerosis noted, to continue annually.  COPD status: stable Satisfied with current treatment?: yes Oxygen  use: yes Dyspnea frequency: daily Cough frequency: increased recently Rescue inhaler frequency: every day Limitation of activity: no Productive cough: yes, like concrete Last Spirometry: with pulmonary  Pneumovax: Up to Date Influenza: Refuses  ANXIETY/STRESS In past has used Wellbutrin , Duloxetine , Prozac , Celexa . Takes Lyrica  50 MG BID along with Cymbalta  for pain/mood with benefit. Buspar  for anxiety.  History: Chronic back pain present, cannot take Tylenol . Dr. Gillie performed recent posterior lumbar interbody fusion on 12/08/22 and she did follow-up with him recently, she reports no changes and has not returned for visit with them.  Severe spinal stenosis L4-L5.  Had injections in past with no benefit. Does not want to go to a pain clinic. Duration:stable Anxious mood: sometimes Excessive worrying: no Irritability: no Sweating: no Nausea: no Palpitations:no Hyperventilation:  no Panic attacks: no Agoraphobia: no  Obscessions/compulsions: no Depressed mood: yes, sometimes    06/27/2023   11:26 AM May 16, 2023   10:01 AM 02/01/2023    9:53 AM 09/01/2022    9:51 AM 08/02/2022    9:44 AM  Depression screen PHQ 2/9  Decreased Interest 3 0 3 0 2  Down, Depressed, Hopeless 1 0 3 0 2  PHQ - 2 Score 4 0 6 0 4  Altered sleeping 2 0 3 2 2   Tired, decreased energy 3 0 3 1 2   Change in appetite 3 0 3 1 0  Feeling bad or failure about yourself  0 0 0 0 0  Trouble concentrating 0 0 0 0 0  Moving slowly or fidgety/restless 0 0 1 0 0  Suicidal thoughts 0 0 0 0 0  PHQ-9 Score 12 0 16 4 8   Difficult doing work/chores Very difficult Not difficult at all Somewhat difficult Not difficult at all Somewhat difficult  Anhedonia: no Weight changes: no Insomnia: yes hard to fall asleep -- due to pain Hypersomnia: no Fatigue/loss of energy: no Feelings of worthlessness: no Feelings of guilt: no Impaired concentration/indecisiveness: no Suicidal ideations: no  Crying spells: no Recent Stressors/Life Changes: no   Relationship problems: no   Family stress: no     Financial stress: no    Job stress: no    Recent death/loss: no    2023/05/16   10:01 AM 02/01/2023    9:54 AM 09/01/2022    9:52 AM 08/02/2022    9:45 AM  GAD 7 : Generalized Anxiety Score  Nervous, Anxious, on Edge 0 1 0 2  Control/stop worrying 0 1 0 2  Worry too much - different things 0 1 1 2   Trouble relaxing 0 0 0 0  Restless 0 0 0 0  Easily annoyed or irritable 0 2 2 2   Afraid - awful might happen 0 0 0 2  Total GAD 7 Score 0 5 3 10   Anxiety Difficulty Not difficult at all Somewhat difficult Not difficult at all Somewhat difficult   Relevant past medical, surgical, family and social history reviewed and updated as indicated. Interim medical history since our last visit reviewed. Allergies and medications reviewed and updated.  Review of Systems  Constitutional:  Negative for activity change,  appetite change, diaphoresis, fatigue and fever.  Respiratory:  Positive for cough, chest tightness, shortness of breath and wheezing.   Cardiovascular:  Positive for leg swelling (occasional). Negative for chest pain and palpitations.  Gastrointestinal: Negative.   Endocrine: Negative for polydipsia, polyphagia and polyuria.  Neurological:  Positive for headaches (occasional). Negative for dizziness, syncope, weakness, light-headedness and numbness.  Psychiatric/Behavioral: Negative.     Per HPI unless specifically indicated above     Objective:    BP 117/76   Pulse 83   Temp 98.9 F (37.2 C) (Oral)  Ht 5' 7 (1.702 m)   Wt 225 lb (102.1 kg)   SpO2 96%   BMI 35.24 kg/m   Wt Readings from Last 3 Encounters:  11/17/23 225 lb (102.1 kg)  10/17/23 219 lb (99.3 kg)  09/01/23 214 lb 9.6 oz (97.3 kg)    Physical Exam Vitals and nursing note reviewed.  Constitutional:      General: She is awake. She is not in acute distress.    Appearance: She is well-developed and well-groomed. She is obese. She is not ill-appearing or toxic-appearing.  HENT:     Head: Normocephalic.     Right Ear: Hearing and external ear normal.     Left Ear: Hearing and external ear normal.  Eyes:     General: Lids are normal.        Right eye: No discharge.        Left eye: No discharge.     Conjunctiva/sclera: Conjunctivae normal.     Pupils: Pupils are equal, round, and reactive to light.  Neck:     Thyroid : No thyromegaly.     Vascular: No carotid bruit.  Cardiovascular:     Rate and Rhythm: Normal rate and regular rhythm.     Heart sounds: Normal heart sounds. No murmur heard.    No gallop.  Pulmonary:     Effort: Pulmonary effort is normal. No accessory muscle usage or respiratory distress.     Breath sounds: Decreased air movement present. Wheezing present. No decreased breath sounds or rales.     Comments: Mild decrease in air movement present and scattered expiratory wheezes noted  throughout. Abdominal:     General: Bowel sounds are normal. There is no distension.     Palpations: Abdomen is soft.     Tenderness: There is no abdominal tenderness.  Musculoskeletal:     Cervical back: Normal range of motion and neck supple.     Right lower leg: 1+ Pitting Edema present.     Left lower leg: 1+ Pitting Edema present.  Lymphadenopathy:     Head:     Right side of head: No submental, submandibular, tonsillar, preauricular or posterior auricular adenopathy.     Left side of head: No submental, submandibular, tonsillar, preauricular or posterior auricular adenopathy.     Cervical: No cervical adenopathy.  Skin:    General: Skin is warm and dry.  Neurological:     Mental Status: She is alert and oriented to person, place, and time.     Deep Tendon Reflexes: Reflexes are normal and symmetric.     Reflex Scores:      Brachioradialis reflexes are 2+ on the right side and 2+ on the left side.      Patellar reflexes are 2+ on the right side and 2+ on the left side. Psychiatric:        Attention and Perception: Attention normal.        Mood and Affect: Mood normal.        Speech: Speech normal.        Behavior: Behavior normal. Behavior is cooperative.        Thought Content: Thought content normal.    Results for orders placed or performed in visit on 11/17/23  Bayer DCA Hb A1c Waived   Collection Time: 11/17/23  9:15 AM  Result Value Ref Range   HB A1C (BAYER DCA - WAIVED) 6.9 (H) 4.8 - 5.6 %      Assessment & Plan:   Problem List Items  Addressed This Visit       Cardiovascular and Mediastinum   Type 2 diabetes, controlled, with peripheral circulatory disorder (HCC) - Primary   Chronic, ongoing with A1c slowly trending down to 6.9% today.  Urine ALB 80 January 2025.  Recommend she continue Olmesartan  daily for kidney protection. In past she did not tolerate Metformin  at higher doses and did not tolerate GLP1 or SGLT2.  Check BS BID, bring to visits.  Continue to  collaborate with vascular.  - Continue Januvia  and Tresiba , recommend to stay at 20 units for now but if gets consistent lows is to cut back on dosage. - Continue to work with [facility] Pharm[facility]. - ARB and statin on board. - Vaccinations up to date. - Foot and eye exam up to date.      Relevant Medications   furosemide  (LASIX ) 20 MG tablet   Other Relevant Orders   Bayer DCA Hb A1c Waived (Completed)   Senile purpura (HCC)   Bilateral upper extremity.  Recommend gentle skin cleansing daily and application of lotion daily.  Monitor skin and notify provider if any abrasions or wounds.      Relevant Medications   furosemide  (LASIX ) 20 MG tablet   PVD (peripheral vascular disease) (HCC)   Chronic, stable, followed by vascular.  Continue collaboration, recent note reviewed.  Continue statin and ASA daily.  Refer to blue toe syndrome plan of care.      Relevant Medications   furosemide  (LASIX ) 20 MG tablet   Hypertension associated with diabetes (HCC)   Chronic, stable.  BP well below goal in office today and at home.  Continue Olmesartan  5 MG daily for kidney protection and Verapamil  80 MG daily + Lasix .  Recommend she monitor BP at least a few mornings a week at home and document.  DASH diet at home.  Labs today: CMP.        Relevant Medications   furosemide  (LASIX ) 20 MG tablet   Other Relevant Orders   Bayer DCA Hb A1c Waived (Completed)   Ambulatory referral to Cardiology   Chronic diastolic heart failure (HCC)   Chronic, stable.  Mild edema noted to day, some concern for acute HF.  Will obtain imaging and increase her Lasix  to 40 MG daily.  EF 05/02/23 was 60-65%.  Continue collaboration with cardiology, recent note reviewed.  Continue current medication regimen. Referral to new cardiologist placed per her request. - Reminded to call for an overnight weight gain of >2 pounds or a weekly weight gain of >5 pounds - not adding salt to food and read food labels. Reviewed the importance  of keeping daily sodium intake to 2000mg  daily. - Avoid NSAIDs      Relevant Medications   furosemide  (LASIX ) 20 MG tablet   Other Relevant Orders   Ambulatory referral to Cardiology   DG Chest 2 View     Respiratory   Chronic respiratory failure with hypoxia and hypercapnia (HCC)   Continue to collaborate with pulmonary.  Tolerating O2, continue use of this.      Relevant Orders   Ambulatory referral to Cardiology   Centrilobular emphysema (HCC)   Chronic, ongoing with ongoing productive cough and not feeling 100%. ?if more related to HF. - Will continue Bevespi  to allow for LABA/LAMA on board.  Discussed at length with her.  Continue Albuterol  as needed.   - Recommend complete cessation of smoking.   - Continue annual CT scans.   - Continue collaboration with pulmonary, recent note  reviewed.   - Continue to wear oxygen  consistently at 3L [place].  SABRA        Relevant Orders   DG Chest 2 View     Endocrine   Hyperlipidemia associated with type 2 diabetes mellitus (HCC)   Chronic, ongoing.  Highly recommend she continue Crestor  and adjust dosing as needed or add on Zetia if poor control.  Lipid check today.      Relevant Medications   furosemide  (LASIX ) 20 MG tablet   Other Relevant Orders   Bayer DCA Hb A1c Waived (Completed)   Comprehensive metabolic panel with GFR   Lipid Panel w/o Chol/HDL Ratio     Nervous and Auditory   Nicotine  dependence, cigarettes, w unsp disorders   I have recommended complete cessation of tobacco use. I have discussed various options available for assistance with tobacco cessation including over the counter methods (Nicotine  gum, patch and lozenges). We also discussed prescription options (Chantix , Nicotine  Inhaler / Nasal Spray). The patient is not interested in pursuing any prescription tobacco cessation options at this time.  Continue yearly lung screening.         Other   Obesity   BMI 35.24.  Recommended eating smaller high protein, low  fat meals more frequently and exercising 30 mins a day 5 times a week with a goal of 10-15lb weight loss in the next 3 months. Patient voiced their understanding and motivation to adhere to these recommendations.       Generalized anxiety disorder   Chronic, exacerbated. Denies SI/HI.  Will continue Duloxetine  60 MG and  Buspar  5 MG BID which offers benefit anxiety and irritability.  Educated her on this medication and change + side effects and BLACK BOX warning.        Abnormal imaging of thyroid    Will order thyroid  ultrasound due to findings on imaging in past, discussed with patient.         Follow up plan: Return in about 1 week (around 11/24/2023) for COPD AND HF.

## 2023-11-17 NOTE — Telephone Encounter (Signed)
 Copied from CRM #[id number]. Topic: Clinical - Request for Lab/Test Order >> Nov 17, 2023 10:17 AM Myrick T wrote: Reason for CRM: patient called stated she went to [facility] for her xray but they are not able to do the xray until Monday

## 2023-11-17 NOTE — Assessment & Plan Note (Signed)
 BMI 35.24.  Recommended eating smaller high protein, low fat meals more frequently and exercising 30 mins a day 5 times a week with a goal of 10-15lb weight loss in the next 3 months. Patient voiced their understanding and motivation to adhere to these recommendations.

## 2023-11-17 NOTE — Assessment & Plan Note (Signed)
 I have recommended complete cessation of tobacco use. I have discussed various options available for assistance with tobacco cessation including over the counter methods (Nicotine gum, patch and lozenges). We also discussed prescription options (Chantix, Nicotine Inhaler / Nasal Spray). The patient is not interested in pursuing any prescription tobacco cessation options at this time.  Continue yearly lung screening.

## 2023-11-17 NOTE — Assessment & Plan Note (Signed)
 Chronic, stable.  Mild edema noted to day, some concern for acute HF.  Will obtain imaging and increase her Lasix  to 40 MG daily.  EF 05/02/23 was 60-65%.  Continue collaboration with cardiology, recent note reviewed.  Continue current medication regimen. Referral to new cardiologist placed per her request. - Reminded to call for an overnight weight gain of >2 pounds or a weekly weight gain of >5 pounds - not adding salt to food and read food labels. Reviewed the importance of keeping daily sodium intake to 2000mg  daily. - Avoid NSAIDs

## 2023-11-17 NOTE — Assessment & Plan Note (Signed)
 Will order thyroid  ultrasound due to findings on imaging in past, discussed with patient.

## 2023-11-17 NOTE — Assessment & Plan Note (Signed)
 Chronic, ongoing.  Highly recommend she continue Crestor  and adjust dosing as needed or add on Zetia if poor control.  Lipid check today.

## 2023-11-17 NOTE — Assessment & Plan Note (Signed)
 Chronic, ongoing with A1c slowly trending down to 6.9% today.  Urine ALB 80 January 2025.  Recommend she continue Olmesartan  daily for kidney protection. In past she did not tolerate Metformin  at higher doses and did not tolerate GLP1 or SGLT2.  Check BS BID, bring to visits.  Continue to collaborate with vascular.  - Continue Januvia  and Tresiba , recommend to stay at 20 units for now but if gets consistent lows is to cut back on dosage. - Continue to work with [facility] PharmD. - ARB and statin on board. - Vaccinations up to date. - Foot and eye exam up to date.

## 2023-11-17 NOTE — Assessment & Plan Note (Signed)
 Continue to collaborate with pulmonary.  Tolerating O2, continue use of this.

## 2023-11-17 NOTE — Assessment & Plan Note (Signed)
Bilateral upper extremity.  Recommend gentle skin cleansing daily and application of lotion daily.  Monitor skin and notify provider if any abrasions or wounds. 

## 2023-11-18 ENCOUNTER — Ambulatory Visit: Payer: Self-pay | Admitting: Nurse Practitioner

## 2023-11-18 DIAGNOSIS — E042 Nontoxic multinodular goiter: Secondary | ICD-10-CM

## 2023-11-18 LAB — LIPID PANEL W/O CHOL/HDL RATIO
Cholesterol, Total: 143 mg/dL (ref 100–199)
HDL: 49 mg/dL (ref >39)
LDL Chol Calc (NIH): 67 mg/dL (ref 0–99)
Triglycerides: 161 mg/dL — ABNORMAL HIGH (ref 0–149)
VLDL Cholesterol Cal: 27 mg/dL (ref 5–40)

## 2023-11-18 LAB — COMPREHENSIVE METABOLIC PANEL WITH GFR
ALT: 11 IU/L (ref 0–32)
AST: 16 IU/L (ref 0–40)
Albumin: 4.3 g/dL (ref 3.9–4.9)
Alkaline Phosphatase: 96 IU/L (ref 44–121)
BUN/Creatinine Ratio: 4 — ABNORMAL LOW (ref 12–28)
BUN: 4 mg/dL — ABNORMAL LOW (ref 8–27)
Bilirubin Total: 0.3 mg/dL (ref 0.0–1.2)
CO2: 27 mmol/L (ref 20–29)
Calcium: 9.5 mg/dL (ref 8.7–10.3)
Chloride: 91 mmol/L — ABNORMAL LOW (ref 96–106)
Creatinine, Ser: 1.04 mg/dL — ABNORMAL HIGH (ref 0.57–1.00)
Globulin, Total: 2.1 g/dL (ref 1.5–4.5)
Glucose: 150 mg/dL — ABNORMAL HIGH (ref 70–99)
Potassium: 5 mmol/L (ref 3.5–5.2)
Sodium: 135 mmol/L (ref 134–144)
Total Protein: 6.4 g/dL (ref 6.0–8.5)
eGFR: 60 mL/min/1.73 (ref >59)

## 2023-11-18 NOTE — Progress Notes (Signed)
 Good morning, please let Stacey Gross know her labs have returned: - Kidney and liver function remain stable. - Lipid panel shows stable levels with exception of ongoing mild elevation in triglycerides, continue all current medications and ensure to take daily.  Any questions? Keep being stellar!!  Thank you for allowing me to participate in your care.  I appreciate you. Kindest regards, Rihaan Barrack

## 2023-11-20 ENCOUNTER — Ambulatory Visit
Admission: RE | Admit: 2023-11-20 | Discharge: 2023-11-20 | Disposition: A | Discharge status: Home / Self Care | Source: Ambulatory Visit | Attending: Nurse Practitioner | Admitting: Nurse Practitioner

## 2023-11-20 DIAGNOSIS — R918 Other nonspecific abnormal finding of lung field: Secondary | ICD-10-CM | POA: Diagnosis not present

## 2023-11-20 DIAGNOSIS — R059 Cough, unspecified: Secondary | ICD-10-CM | POA: Diagnosis not present

## 2023-11-20 DIAGNOSIS — J432 Centrilobular emphysema: Secondary | ICD-10-CM | POA: Insufficient documentation

## 2023-11-20 DIAGNOSIS — I5032 Chronic diastolic (congestive) heart failure: Secondary | ICD-10-CM | POA: Diagnosis not present

## 2023-11-20 DIAGNOSIS — Z96611 Presence of right artificial shoulder joint: Secondary | ICD-10-CM | POA: Diagnosis not present

## 2023-11-20 MED ORDER — CEFPODOXIME PROXETIL 200 MG PO TABS: 200.0000 mg | ORAL_TABLET | Freq: Two times a day (BID) | ORAL | 0 refills | Status: AC

## 2023-11-20 NOTE — Progress Notes (Signed)
 Good morning, please let Pinkey know her imaging has returned and there is concern for infectious element still being present.  I am sending in a new antibiotic to start taking twice a day.  Any questions?

## 2023-11-20 NOTE — Telephone Encounter (Signed)
 Called and spoke to patient. She is on her way to [place] now. Messaged provider to change order location back to [place].

## 2023-11-20 NOTE — Addendum Note (Signed)
 Addended by: Yasin Ducat T on: 11/20/2023 08:59 AM   Modules accepted: Orders

## 2023-11-22 ENCOUNTER — Telehealth: Payer: Self-pay | Admitting: Pharmacy Technician

## 2023-11-22 NOTE — Progress Notes (Signed)
   11/22/2023 Name: Stacey Gross MRN: [id number] DOB: 1960-07-22  Patient is appearing on a report for [facility] [place] Metric Diabetes and last engaged with the clinical pharmacist to discuss diabetes on 10/09/2023. Contacted patient today to discuss diabetes management and completed medication review.   Diabetes Plan from last clinical pharmacist appointment Diabetes -Home BG readings reflect great improvement in control -Due for A1c in August -Continue current regimen at this time and regular monitoring of FBG  Medication Adherence Barriers Identified:  Patient made recommended medication changes per plan: Yes Patient informs she takes Januvia  100mg  daily and Tresiba  20 units daily. Her A1C in August is 6.9 down from 8.6. Access issues with any new medication or testing device: No Per Medication Dispense Hisotry for [facility], Januvia  was last dispense on 8/2 for 90 days supply and Tresiba  on 10/18/23 for 75 day supply. No data available in Dr Annemarie. Patient is checking blood sugars as prescribed: No Patient informs she is doing fingerstick blood sugar checks which she does not like to do. She informs physician asked her to keep a log and she will start that soon. She informs her blood sugars are ranging between 143-193. Patient informs it is hard for her to walk and she would like to get out and walk and shop. However, she informs she is unable to do so due to legs being weak. She informs she gives out just walking from a physician waiting room or an exam room or even from her car to her couch. She reports having a walker but feels as though is does not help. She does not want to do physical therapy as she has had several back surgeries with rods in place (physician is aware of this per patient). She feels physical therapy would aggravate that issue  She reports she does have some swelling in her legs at the ankles. She reports being short of breath with these small walks (previously described). She  reports she is on oxygen  and physician is aware of shortness of breath. Patient is open to a scooter if possible. Would like for this to be mentioned to physician.  Medication Adherence Barriers Addressed/Actions Taken:  Reviewed medication changes per plan from last clinical pharmacist note. Educated patient to contact pharmacy regarding new prescriptions Reviewed instructions for monitoring blood sugars at home and reminded patient to keep a written log to review with pharmacist Reminded patient of date/time of upcoming clinical pharmacist follow up and any upcoming PCP/specialists visits. Patient denies transportation barriers to the appointment. Yes  Next clinical pharmacist appointment is scheduled for: 01/09/2024   Kate Caddy, CPhT [facility] Office: [phone] Email: [email or web address]

## 2023-11-23 ENCOUNTER — Telehealth: Payer: Self-pay

## 2023-11-23 DIAGNOSIS — E1151 Type 2 diabetes mellitus with diabetic peripheral angiopathy without gangrene: Secondary | ICD-10-CM

## 2023-11-23 MED ORDER — FREESTYLE LIBRE 3 READER DEVI: 0 refills | Status: AC

## 2023-11-23 MED ORDER — FREESTYLE LIBRE 3 PLUS SENSOR MISC: 4 refills | Status: AC

## 2023-11-23 NOTE — Progress Notes (Signed)
   11/23/2023  Patient ID: Stacey Gross, female   DOB: Jun 23, 1960, 63 y.o.   MRN: [id number]  [name] 3 reader and [name] 3+ sensors going through at pharmacy for $0 on Medicare and will be ready for pick up tomorrow.  Contacted patient to make her aware and offer assistance with set up.  She believes her sister can help her with set-up, but I will call Monday to verify.  If help is needed, I will see if she can come see me in person at [facility] next Wednesday.  [name], PharmD, DPLA

## 2023-11-23 NOTE — Progress Notes (Signed)
   11/23/2023  Patient ID: Stacey Gross Spanner, female   DOB: October 04, 1960, 63 y.o.   MRN: [id number]  Kate Caddy, CPhT, spoke with patient this week to follow-up on any needs related to diabetes management, and patient stated she would like a CGM to prevent multiple daily finger sticks to check BG.  Based on insulin  use, Medicare should cover CGM; so I have sent orders for a Libre 3 reader and Libre 3+ sensors to her pharmacy under the [facility] standing order for CGM.  I will contact the pharmacy to verify insurance coverage and notify patient.  We already have a telephone follow-up scheduled for 10/7, but I can also see her in person sooner if assistance is needed with setting up Libre 3.  Channing Gross Mealing, PharmD, DPLA

## 2023-11-27 ENCOUNTER — Telehealth: Payer: Self-pay

## 2023-11-27 ENCOUNTER — Ambulatory Visit
Admission: RE | Admit: 2023-11-27 | Discharge: 2023-11-27 | Disposition: A | Discharge status: Home / Self Care | Source: Ambulatory Visit | Attending: Nurse Practitioner | Admitting: Nurse Practitioner

## 2023-11-27 DIAGNOSIS — R9389 Abnormal findings on diagnostic imaging of other specified body structures: Secondary | ICD-10-CM | POA: Insufficient documentation

## 2023-11-27 DIAGNOSIS — E042 Nontoxic multinodular goiter: Secondary | ICD-10-CM | POA: Diagnosis not present

## 2023-11-27 NOTE — Progress Notes (Signed)
   11/27/2023  Patient ID: Stacey Gross, female   DOB: [birth year], 63 y.o.   MRN: [id number]  Patient outreach to follow-up on [facility] 3 Reader and + Sensors.  Patient has picked these up and has them set up and is currently using for CGM.  Telephone follow-up already scheduled for 10/7, but I informed her to contact me if she needs anything before then.  [name], PharmD, DPLA

## 2023-11-29 ENCOUNTER — Telehealth: Payer: Self-pay

## 2023-11-29 NOTE — Telephone Encounter (Signed)
 Copied from CRM [id number]. Topic: General - Other >> Nov 29, 2023  9:29 AM Travis F wrote: Reason for CRM: Patient is calling in because she had an appointment on 11/17/23 and says she did not check out at the appointment. Patient wants to know does she need to schedule an appointment for a follow up. Please advise.

## 2023-11-29 NOTE — Telephone Encounter (Signed)
 scheduled

## 2023-11-29 NOTE — Telephone Encounter (Signed)
 Per providers note, patient was to have a 1 week follow up after appointment. Please call to schedule ASAP.

## 2023-12-03 DIAGNOSIS — J9611 Chronic respiratory failure with hypoxia: Secondary | ICD-10-CM | POA: Diagnosis not present

## 2023-12-03 NOTE — Progress Notes (Signed)
 Good morning, please let Stacey Gross know her thyroid  imaging has returned and thyroid  gland is not showing any cancerous findings and no further imaging or biopsy is recommended.  There are multiple low risk nodules and we will continue to monitor thyroid  labs with this.  Any questions? Have a great day!!

## 2023-12-03 NOTE — Patient Instructions (Incomplete)
 Increase Lasix  to 60 MG daily for 6 days  Be Involved in Caring For Your Health:  Taking Medications When medications are taken as directed, they can greatly improve your health. But if they are not taken as prescribed, they may not work. In some cases, not taking them correctly can be harmful. To help ensure your treatment remains effective and safe, understand your medications and how to take them. Bring your medications to each visit for review by your provider.  Your lab results, notes, and after visit summary will be available on My Chart. We strongly encourage you to use this feature. If lab results are abnormal the clinic will contact you with the appropriate steps. If the clinic does not contact you assume the results are satisfactory. You can always view your results on My Chart. If you have questions regarding your health or results, please contact the clinic during office hours. You can also ask questions on My Chart.  We at [facility] are grateful that you chose us  to provide your care. We strive to provide evidence-based and compassionate care and are always looking for feedback. If you get a survey from the clinic please complete this so we can hear your opinions.  Heart Failure: Eating Plan Heart failure is a long-term condition where the heart can't pump enough blood through the body. When this happens, parts of the body don't get the blood and oxygen  they need. Living with heart failure can be hard. But a healthy lifestyle and choosing the right foods may help to improve your symptoms. If you have heart failure, your eating plan will include limiting the amount of salt, also called sodium, and unhealthy fats you eat. What are tips for following this plan? Reading food labels Check food labels for the amount of sodium per serving. Choose foods that have less than 140 mg (milligrams) of sodium in each serving. Check food labels for the number of calories per serving. This  is important if you need to limit your daily calorie intake to lose weight. Check food labels for the serving size. If you eat more than one serving, you'll be eating more sodium and calories than what's listed on the label. Look for foods with the words sodium-free, very low sodium, or low sodium on the package. Foods labeled as reduced sodium, lightly salted, or no salt added may still have more sodium than what's recommended for you. Cooking Avoid adding salt when cooking. Before using any salt substitutes, talk with your health care provider or an expert in healthy eating called a dietitian. Season food with salt-free seasonings, spices, or herbs. Check the label of seasoning mixes to make sure they don't contain salt. Cook with heart-healthy oils, such as olive, canola, soybean, or sunflower oil. Do not fry foods. Cook foods using low-fat methods, like baking, boiling, grilling, and broiling. Limit unhealthy fats when cooking. To do this: Remove the skin from poultry, such as chicken. Remove all the fat you can see on meats. Skim the fat off from stews, soups, and gravies before serving them. Meal planning  Limit your intake of: Processed, canned, or prepackaged foods. Foods that are high in trans fat, such as fried foods. Sweets, desserts, sugary drinks, and other foods with added sugar. Full-fat dairy products, such as whole milk. Eat a balanced diet. This may include: 4-5 servings of fruit each day and 4-5 servings of vegetables each day. At each meal, try to fill one-half of your plate with fruits and  vegetables. Up to 6-8 servings of whole grains each day. Up to 2 servings of lean meat, poultry, or fish each day. One serving of meat is equal to 3 oz (85 g). This is about the same size as a deck of cards. 2 servings of low-fat dairy each day. Heart-healthy fats. Healthy fats called omega-3 fatty acids are a good choice. They're found in foods such as flaxseed and  cold-water  fish like sardines, salmon, and mackerel. Aim to eat 25-35 g (grams) of fiber a day. Foods that are high in fiber include apples, broccoli, carrots, beans, peas, and whole grains. Do not add salt or condiments that contain salt (such as soy sauce) to foods before eating. When eating at a restaurant, ask that your food be prepared with less salt or no salt, if possible. Try to eat 2 or more plant-based or meat-free meals each week. Cook more meals at home and eat less at restaurants, buffets, and fast food places. General information Do not eat more than 2,300 mg of sodium a day. The amount of sodium that's recommended for you may be lower, depending on your condition. Stay at a healthy weight as told. Ask your provider what a healthy weight is for you. Check your weight every day. Work with your provider and dietitian to make a plan that will help you lose weight or stay at your current weight. Limit how much fluid you drink. Ask your provider or dietitian how much fluid you can have each day. Limit or avoid alcohol as told. Recommended foods Fruits All fresh, frozen, and canned fruits. Dried fruits, such as raisins, prunes, and cranberries. Vegetables All fresh vegetables. Vegetables that are frozen without sauce or added salt. Low-sodium or sodium-free canned vegetables. Grains Bread with less than 80 mg of sodium per slice. Whole-wheat pasta, quinoa, and brown rice. Oats and oatmeal. Barley. Millet. Grits and cream of wheat. Whole-grain and whole-wheat cold cereal. Meats and other protein foods Lean cuts of meat. Skinless chicken and [place]. Fish with high omega-3 fatty acids, such as salmon, sardines, and other cold-water  fishes. Eggs. Dried beans, peas, and edamame. Unsalted nuts and nut butters. Dairy Low-fat or nonfat (skim) milk and dried milk. Rice milk, soy milk, and almond milk. Low-fat or nonfat yogurt. Small amounts of reduced-sodium block cheese. Low-sodium cottage  cheese. Fats and oils Olive, canola, soybean, flaxseed, avocado, or sunflower oil. Sweets and desserts Applesauce. Granola bars. Sugar-free pudding and gelatin. Frozen fruit bars. Seasoning and other foods Fresh and dried herbs. Lemon or lime juice. Vinegar. Low-sodium ketchup. Salt-free marinades, salad dressings, sauces, and seasonings. The items listed above may not be all the foods and drinks you can have. Talk with a dietitian to learn more. Foods to avoid Fruits Fruits that are dried with preservatives that contain sodium. Vegetables Canned vegetables. Frozen vegetables with sauce or seasonings. Creamed vegetables. [place] fries. Onion rings. Pickled vegetables and sauerkraut. Grains Bread with more than 80 mg of sodium per slice. Hot or cold cereal with more than 140 mg sodium per serving. Salted pretzels and crackers. Prepackaged breadcrumbs. Bagels, croissants, and biscuits. Meats and other protein foods Ribs and chicken wings. Bacon, ham, pepperoni, bologna, salami, and packaged luncheon meats. Hot dogs, bratwurst, and sausage. Canned meat. Smoked meat and fish. Salted nuts and seeds. Dairy Whole milk, half-and-half, and cream. Buttermilk. Processed cheese, cheese spreads, and cheese curds. Regular cottage cheese. Feta cheese. Shredded cheese. String cheese. Fats and oils Butter, lard, shortening, ghee, and bacon fat. Canned and  packaged gravies. Seasoning and other foods Onion salt, garlic salt, table salt, and sea salt. Marinades. Regular salad dressings. Relishes, pickles, and olives. Meat flavorings and tenderizers, and bouillon cubes. Horseradish, ketchup, and mustard. Worcestershire sauce. Teriyaki sauce, soy sauce (including reduced sodium). Hot sauce and Tabasco sauce. Steak sauce, fish sauce, oyster sauce, and cocktail sauce. Taco seasonings. Barbecue sauce. Tartar sauce. The items listed above may not be all the foods and drinks you should avoid. Talk with a dietitian to  learn more. This information is not intended to replace advice given to you by your health care provider. Make sure you discuss any questions you have with your health care provider. Document Revised: 11/14/2022 Document Reviewed: 11/03/2022 Elsevier Patient Education  2024 [employer].

## 2023-12-05 ENCOUNTER — Ambulatory Visit: Admitting: Nurse Practitioner

## 2023-12-05 ENCOUNTER — Encounter: Payer: Self-pay | Admitting: Nurse Practitioner

## 2023-12-05 VITALS — BP 136/80 | HR 85 | Temp 99.0°F | Resp 16 | Ht 67.01 in | Wt 234.6 lb

## 2023-12-05 DIAGNOSIS — Z23 Encounter for immunization: Secondary | ICD-10-CM

## 2023-12-05 DIAGNOSIS — J432 Centrilobular emphysema: Secondary | ICD-10-CM

## 2023-12-05 DIAGNOSIS — E1159 Type 2 diabetes mellitus with other circulatory complications: Secondary | ICD-10-CM | POA: Diagnosis not present

## 2023-12-05 DIAGNOSIS — R601 Generalized edema: Secondary | ICD-10-CM | POA: Diagnosis not present

## 2023-12-05 DIAGNOSIS — I152 Hypertension secondary to endocrine disorders: Secondary | ICD-10-CM | POA: Diagnosis not present

## 2023-12-05 DIAGNOSIS — I5032 Chronic diastolic (congestive) heart failure: Secondary | ICD-10-CM | POA: Diagnosis not present

## 2023-12-05 NOTE — Assessment & Plan Note (Addendum)
 Chronic, stable.  BP stable on exam today.  Continue Olmesartan  5 MG daily for kidney protection and Verapamil  80 MG daily + Lasix .  Recommend she monitor BP at least a few mornings a week at home and document.  DASH diet at home.  Concern for underlying acute HF due to underlying chronic COPD and diabetes. Since no improvement in symptoms have highly recommended she be seen in ER for more urgent assessment, she reports she will have Newell take her first thing in the morning since [place] needs to work this afternoon and unable to take her.  Have advised if any worsening to immediately call EMS. Will check CBC, TSH, BNP today.  Increase Lasix  to 60 MG daily, start today until seen in ER.

## 2023-12-05 NOTE — Progress Notes (Signed)
 BP 136/80 (BP Location: Left Arm, Patient Position: Sitting)   Pulse 85   Temp 99 F (37.2 C) (Oral)   Resp 16   Ht 5' 7.01 (1.702 m)   Wt 234 lb 9.6 oz (106.4 kg)   SpO2 96%   BMI 36.73 kg/m    Subjective:    Patient ID: Stacey Gross, female    DOB: [birth year], 63 y.o.   MRN: [id number]  HPI: Stacey Gross is a 63 y.o. female  Chief Complaint  Patient presents with   COPD    Feeling sleepy, feels tired and wants to sleep. No breathing changes since last visit.    xray     Had chest xray on 11/20/2023.    Alyse Beau, at bedside to assist with HPI.  COPD & CHF Follow-up today for lung and heart check.  Uses Bevespi  and Albuterol .  Uses Albuterol  every night.  Pulmonary last seen on 06/15/23 and they treated for exacerbation.  Uses oxygen  every day - 3 L. At visit on 11/17/23 increased Lasix  dosing to 40 MG daily for period and started new abx due to CXR noting opacity right minor fissure favoring infection.  Continues to have swelling to BLE majority of the time.  When elevates doe not help 100%.  Going to see cardiology on September 19th at [place], new cardiologist per patient request. Continues to eat 5 pieces of thin salami daily, but denies any other high sodium foods.  Continues to gain weight on review.  Smoking nicotine , 3/4 to 1/2 PPD.  Not interested in quitting -- has been smoking since age 24.  Lung CT screening 04/27/23 == emphysema and aortic atherosclerosis noted, to continue annually.  COPD status: stable Satisfied with current treatment?: yes Oxygen  use: yes Dyspnea frequency: daily Cough frequency: increased recently Rescue inhaler frequency: every day Limitation of activity: no Productive cough: yes, like concrete Last Spirometry: with pulmonary Pneumovax: Up to Date Influenza: Refuses   Relevant past medical, surgical, family and social history reviewed and updated as indicated. Interim medical history since our last visit reviewed. Allergies  and medications reviewed and updated.  Review of Systems  Constitutional:  Negative for activity change, appetite change, diaphoresis, fatigue and fever.  Respiratory:  Positive for cough, chest tightness, shortness of breath and wheezing.   Cardiovascular:  Positive for chest pain (sternal area on occasion with reflux) and leg swelling (ongoing). Negative for palpitations.  Gastrointestinal: Negative.   Endocrine: Negative for polydipsia, polyphagia and polyuria.  Neurological:  Positive for headaches (occasional). Negative for dizziness, syncope, weakness, light-headedness and numbness.  Psychiatric/Behavioral: Negative.     Per HPI unless specifically indicated above     Objective:    BP 136/80 (BP Location: Left Arm, Patient Position: Sitting)   Pulse 85   Temp 99 F (37.2 C) (Oral)   Resp 16   Ht 5' 7.01 (1.702 m)   Wt 234 lb 9.6 oz (106.4 kg)   SpO2 96%   BMI 36.73 kg/m   Wt Readings from Last 3 Encounters:  12/05/23 234 lb 9.6 oz (106.4 kg)  11/17/23 225 lb (102.1 kg)  10/17/23 219 lb (99.3 kg)    Physical Exam Vitals and nursing note reviewed.  Constitutional:      General: She is awake. She is not in acute distress.    Appearance: She is well-developed and well-groomed. She is obese. She is not ill-appearing or toxic-appearing.  HENT:     Head: Normocephalic.     Right  Ear: Hearing and external ear normal.     Left Ear: Hearing and external ear normal.  Eyes:     General: Lids are normal.        Right eye: No discharge.        Left eye: No discharge.     Conjunctiva/sclera: Conjunctivae normal.     Pupils: Pupils are equal, round, and reactive to light.  Neck:     Thyroid : No thyromegaly.     Vascular: No carotid bruit.  Cardiovascular:     Rate and Rhythm: Normal rate and regular rhythm.     Heart sounds: Normal heart sounds. No murmur heard.    No gallop.  Pulmonary:     Effort: Pulmonary effort is normal. No accessory muscle usage or respiratory  distress.     Breath sounds: Decreased air movement present. Wheezing and rales present. No decreased breath sounds.     Comments: Mild decrease in air movement present and scattered expiratory wheezes noted throughout + rales bilateral lower lobes. Abdominal:     General: Bowel sounds are normal. There is no distension.     Palpations: Abdomen is soft.     Tenderness: There is no abdominal tenderness.  Musculoskeletal:     Cervical back: Normal range of motion and neck supple.     Right lower leg: 2+ Pitting Edema present.     Left lower leg: 2+ Pitting Edema present.  Lymphadenopathy:     Head:     Right side of head: No submental, submandibular, tonsillar, preauricular or posterior auricular adenopathy.     Left side of head: No submental, submandibular, tonsillar, preauricular or posterior auricular adenopathy.     Cervical: No cervical adenopathy.  Skin:    General: Skin is warm and dry.  Neurological:     Mental Status: She is alert and oriented to person, place, and time.     Deep Tendon Reflexes: Reflexes are normal and symmetric.     Reflex Scores:      Brachioradialis reflexes are 2+ on the right side and 2+ on the left side.      Patellar reflexes are 2+ on the right side and 2+ on the left side. Psychiatric:        Attention and Perception: Attention normal.        Mood and Affect: Mood normal.        Speech: Speech normal.        Behavior: Behavior normal. Behavior is cooperative.        Thought Content: Thought content normal.    Results for orders placed or performed in visit on 11/17/23  Bayer DCA Hb A1c Waived   Collection Time: 11/17/23  9:15 AM  Result Value Ref Range   HB A1C (BAYER DCA - WAIVED) 6.9 (H) 4.8 - 5.6 %  Comprehensive metabolic panel with GFR   Collection Time: 11/17/23  9:16 AM  Result Value Ref Range   Glucose 150 (H) 70 - 99 mg/dL   BUN 4 (L) 8 - 27 mg/dL   Creatinine, Ser 8.95 (H) 0.57 - 1.00 mg/dL   eGFR 60 >40 fO/fpw/8.26    BUN/Creatinine Ratio 4 (L) 12 - 28   Sodium 135 134 - 144 mmol/L   Potassium 5.0 3.5 - 5.2 mmol/L   Chloride 91 (L) 96 - 106 mmol/L   CO2 27 20 - 29 mmol/L   Calcium  9.5 8.7 - 10.3 mg/dL   Total Protein 6.4 6.0 - 8.5 g/dL  Albumin 4.3 3.9 - 4.9 g/dL   Globulin, Total 2.1 1.5 - 4.5 g/dL   Bilirubin Total 0.3 0.0 - 1.2 mg/dL   Alkaline Phosphatase 96 44 - 121 IU/L   AST 16 0 - 40 IU/L   ALT 11 0 - 32 IU/L  Lipid Panel w/o Chol/HDL Ratio   Collection Time: 11/17/23  9:16 AM  Result Value Ref Range   Cholesterol, Total 143 100 - 199 mg/dL   Triglycerides 838 (H) 0 - 149 mg/dL   HDL 49 >60 mg/dL   VLDL Cholesterol Cal 27 5 - 40 mg/dL   LDL Chol Calc (NIH) 67 0 - 99 mg/dL      Assessment & Plan:   Problem List Items Addressed This Visit       Cardiovascular and Mediastinum   Hypertension associated with diabetes (HCC)   Chronic, stable.  BP stable on exam today.  Continue Olmesartan  5 MG daily for kidney protection and Verapamil  80 MG daily + Lasix .  Recommend she monitor BP at least a few mornings a week at home and document.  DASH diet at home.  Concern for underlying acute HF due to underlying chronic COPD and diabetes. Since no improvement in symptoms have highly recommended she be seen in ER for more urgent assessment, she reports she will have Newell take her first thing in the morning since [place] needs to work this afternoon and unable to take her.  Have advised if any worsening to immediately call EMS. Will check CBC, TSH, BNP today.  Increase Lasix  to 60 MG daily, start today until seen in ER.      Chronic diastolic heart failure (HCC) - Primary   Chronic, stable.  Ongoing weight gain and edema present.  EF 05/02/23 was 60-65%.  Continue collaboration with cardiology, recent note reviewed.  Continue current medication regimen. Concern for underlying acute HF due to underlying chronic COPD and diabetes. Since no improvement in symptoms have highly recommended she be seen in ER  for more urgent assessment, she reports she will have Newell take her first thing in the morning since [place] needs to work this afternoon and unable to take her.  Have advised if any worsening until then to immediately call EMS. Will check CBC, TSH, BNP today.  Increase Lasix  to 60 MG daily, start today until seen in ER. Will hold off on repeat echo at this time, as suspect they will perform in ER. Advised her to reduce salt intake, including salami. - Reminded to call for an overnight weight gain of >2 pounds or a weekly weight gain of >5 pounds - not adding salt to food and read food labels. Reviewed the importance of keeping daily sodium intake to 2000mg  daily. - Avoid NSAIDs        Respiratory   Centrilobular emphysema (HCC)   Chronic, ongoing with treatment recently for exacerbation and opacity noted to right minor fissure. Concern for underlying acute HF due to underlying chronic COPD and diabetes. Since no improvement in symptoms have highly recommended she be seen in ER for more urgent assessment, she reports she will have Newell take her first thing in the morning since [place] needs to work this afternoon and unable to take her.  Have advised if any worsening to immediately call EMS. Will check CBC, TSH, BNP today.  Increase Lasix  to 60 MG daily, start today until seen in ER. - Will continue Bevespi  to allow for LABA/LAMA on board.  Discussed at length with her.  Continue Albuterol  as needed.   - Recommend complete cessation of smoking.   - Continue annual CT scans.   - Continue collaboration with pulmonary, recent note reviewed.   - Continue to wear oxygen  consistently at 3L .  SABRA        Other Visit Diagnoses       Generalized edema       Refer to HF plan of care.   Relevant Orders   Comprehensive metabolic panel with GFR   TSH   B Nat Peptide   CBC with Differential/Platelet     Flu vaccine need       Flu vaccine in office today, educated on this.   Relevant Orders    Flu vaccine HIGH DOSE PF(Fluzone Trivalent) (Completed)       Time: 25 minutes, >50% spent counseling/or care coordination   Follow up plan: Return in about 1 week (around 12/12/2023) for COPD and Heart.

## 2023-12-05 NOTE — Assessment & Plan Note (Addendum)
 Chronic, ongoing with treatment recently for exacerbation and opacity noted to right minor fissure. Concern for underlying acute HF due to underlying chronic COPD and diabetes. Since no improvement in symptoms have highly recommended she be seen in ER for more urgent assessment, she reports she will have Newell take her first thing in the morning since [place] needs to work this afternoon and unable to take her.  Have advised if any worsening to immediately call EMS. Will check CBC, TSH, BNP today.  Increase Lasix  to 60 MG daily, start today until seen in ER. - Will continue Bevespi  to allow for LABA/LAMA on board.  Discussed at length with her.  Continue Albuterol  as needed.   - Recommend complete cessation of smoking.   - Continue annual CT scans.   - Continue collaboration with pulmonary, recent note reviewed.   - Continue to wear oxygen  consistently at 3L [place].  SABRA

## 2023-12-05 NOTE — Progress Notes (Signed)
 Patient seen in office this morning. Results reviewed during visit.

## 2023-12-05 NOTE — Assessment & Plan Note (Addendum)
 Chronic, stable.  Ongoing weight gain and edema present.  EF 05/02/23 was 60-65%.  Continue collaboration with cardiology, recent note reviewed.  Continue current medication regimen. Concern for underlying acute HF due to underlying chronic COPD and diabetes. Since no improvement in symptoms have highly recommended she be seen in ER for more urgent assessment, she reports she will have Newell take her first thing in the morning since [place] needs to work this afternoon and unable to take her.  Have advised if any worsening until then to immediately call EMS. Will check CBC, TSH, BNP today.  Increase Lasix  to 60 MG daily, start today until seen in ER. Will hold off on repeat echo at this time, as suspect they will perform in ER. Advised her to reduce salt intake, including salami. - Reminded to call for an overnight weight gain of >2 pounds or a weekly weight gain of >5 pounds - not adding salt to food and read food labels. Reviewed the importance of keeping daily sodium intake to 2000mg  daily. - Avoid NSAIDs

## 2023-12-06 ENCOUNTER — Ambulatory Visit: Payer: Self-pay | Admitting: Nurse Practitioner

## 2023-12-06 DIAGNOSIS — R059 Cough, unspecified: Secondary | ICD-10-CM | POA: Diagnosis not present

## 2023-12-06 DIAGNOSIS — Z886 Allergy status to analgesic agent status: Secondary | ICD-10-CM | POA: Diagnosis not present

## 2023-12-06 DIAGNOSIS — Z881 Allergy status to other antibiotic agents status: Secondary | ICD-10-CM | POA: Diagnosis not present

## 2023-12-06 DIAGNOSIS — J9811 Atelectasis: Secondary | ICD-10-CM | POA: Diagnosis not present

## 2023-12-06 DIAGNOSIS — J441 Chronic obstructive pulmonary disease with (acute) exacerbation: Secondary | ICD-10-CM | POA: Diagnosis not present

## 2023-12-06 DIAGNOSIS — R918 Other nonspecific abnormal finding of lung field: Secondary | ICD-10-CM | POA: Diagnosis not present

## 2023-12-06 DIAGNOSIS — Z91040 Latex allergy status: Secondary | ICD-10-CM | POA: Diagnosis not present

## 2023-12-06 DIAGNOSIS — J449 Chronic obstructive pulmonary disease, unspecified: Secondary | ICD-10-CM | POA: Diagnosis not present

## 2023-12-06 DIAGNOSIS — F17210 Nicotine dependence, cigarettes, uncomplicated: Secondary | ICD-10-CM | POA: Diagnosis not present

## 2023-12-06 LAB — COMPREHENSIVE METABOLIC PANEL WITH GFR
ALT: 6 IU/L (ref 0–32)
AST: 11 IU/L (ref 0–40)
Albumin: 4.1 g/dL (ref 3.9–4.9)
Alkaline Phosphatase: 84 IU/L (ref 44–121)
BUN/Creatinine Ratio: 14 (ref 12–28)
BUN: 15 mg/dL (ref 8–27)
Bilirubin Total: 0.3 mg/dL (ref 0.0–1.2)
CO2: 30 mmol/L — ABNORMAL HIGH (ref 20–29)
Calcium: 9.1 mg/dL (ref 8.7–10.3)
Chloride: 95 mmol/L — ABNORMAL LOW (ref 96–106)
Creatinine, Ser: 1.08 mg/dL — ABNORMAL HIGH (ref 0.57–1.00)
Globulin, Total: 2 g/dL (ref 1.5–4.5)
Glucose: 94 mg/dL (ref 70–99)
Potassium: 5 mmol/L (ref 3.5–5.2)
Sodium: 140 mmol/L (ref 134–144)
Total Protein: 6.1 g/dL (ref 6.0–8.5)
eGFR: 57 mL/min/1.73 — ABNORMAL LOW (ref >59)

## 2023-12-06 LAB — CBC WITH DIFFERENTIAL/PLATELET
Basophils Absolute: 0.1 x10E3/uL (ref 0.0–0.2)
Basos: 1 %
EOS (ABSOLUTE): 0.1 x10E3/uL (ref 0.0–0.4)
Eos: 1 %
Hematocrit: 37.7 % (ref 34.0–46.6)
Hemoglobin: 11.7 g/dL (ref 11.1–15.9)
Immature Grans (Abs): 0.1 x10E3/uL (ref 0.0–0.1)
Immature Granulocytes: 1 %
Lymphocytes Absolute: 2 x10E3/uL (ref 0.7–3.1)
Lymphs: 25 %
MCH: 28.7 pg (ref 26.6–33.0)
MCHC: 31 g/dL — ABNORMAL LOW (ref 31.5–35.7)
MCV: 93 fL (ref 79–97)
Monocytes Absolute: 0.5 x10E3/uL (ref 0.1–0.9)
Monocytes: 6 %
Neutrophils Absolute: 5.5 x10E3/uL (ref 1.4–7.0)
Neutrophils: 66 %
Platelets: 220 x10E3/uL (ref 150–450)
RBC: 4.07 x10E6/uL (ref 3.77–5.28)
RDW: 12.5 % (ref 11.7–15.4)
WBC: 8.1 x10E3/uL (ref 3.4–10.8)

## 2023-12-06 LAB — BRAIN NATRIURETIC PEPTIDE: BNP: 67 pg/mL (ref 0.0–100.0)

## 2023-12-06 LAB — TSH: TSH: 0.692 u[IU]/mL (ref 0.450–4.500)

## 2023-12-06 NOTE — Progress Notes (Signed)
 Good morning, please let Stacey Gross know her labs have returned.  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  Liver function normal.  CBC is overall stable.  Thyroid  and BNP levels normal. She was supposed to go to ER today, 12/06/23, had stated she would go in the morning but do not see where she did go.  Please check on this and advise her I do continue to recommend this due to her weight gain and worsening SOB -- she needs ASAP assessment of heart. I will check on her Thursday and see if she went to ER. Any questions? Keep being amazing!!  Thank you for allowing me to participate in your care.  I appreciate you. Kindest regards, Harlyn Rathmann

## 2023-12-10 NOTE — Patient Instructions (Signed)
 Be Involved in Caring For Your Health:  Taking Medications When medications are taken as directed, they can greatly improve your health. But if they are not taken as prescribed, they may not work. In some cases, not taking them correctly can be harmful. To help ensure your treatment remains effective and safe, understand your medications and how to take them. Bring your medications to each visit for review by your provider.  Your lab results, notes, and after visit summary will be available on My Chart. We strongly encourage you to use this feature. If lab results are abnormal the clinic will contact you with the appropriate steps. If the clinic does not contact you assume the results are satisfactory. You can always view your results on My Chart. If you have questions regarding your health or results, please contact the clinic during office hours. You can also ask questions on My Chart.  We at [facility] are grateful that you chose us  to provide your care. We strive to provide evidence-based and compassionate care and are always looking for feedback. If you get a survey from the clinic please complete this so we can hear your opinions.   COPD Action Plan A COPD action plan is a description of what to do when you have a flare (exacerbation) of chronic obstructive pulmonary disease (COPD). Your action plan is a color-coded plan that lists the symptoms that indicate whether your condition is under control and what actions to take. If you have symptoms in the green zone, it means you are doing well that day. If you have symptoms in the yellow zone, it means you are having a bad day or an exacerbation. If you have symptoms in the red zone, you need urgent medical care. Follow the plan that you and your health care provider developed. Review your plan with your health care provider at each visit. Red zone Symptoms in this zone mean that you should get medical help right away. They  include: Feeling very short of breath, even when you are resting. Not being able to do any activities because of poor breathing. Not being able to sleep because of poor breathing. Fever or shaking chills. Feeling confused or very sleepy. Chest pain. Coughing up blood. If you have any of these symptoms, call emergency services (911 in the [place] or go to the nearest emergency room. Yellow zone Symptoms in this zone mean that your condition may be getting worse. They include: Feeling more short of breath than usual. Having less energy for daily activities than usual. Phlegm or mucus that is thicker than usual. Needing to use your rescue inhaler or nebulizer more often than usual. More ankle swelling than usual. Coughing more than usual. Feeling like you have a chest cold. Trouble sleeping due to COPD symptoms. Decreased appetite. COPD medicines not helping as much as usual. If you experience any yellow symptoms: Keep taking your daily medicines as directed. Use your quick-relief inhaler as told by your health care provider. If you were prescribed steroid medicine to take by mouth (oral medicine), start taking it as told by your health care provider. If you were prescribed an antibiotic medicine, start taking it as told by your health care provider. Do not stop taking the antibiotic even if you start to feel better. Use oxygen  as told by your health care provider. Get more rest. Do your pursed-lip breathing exercises. Do not smoke. Avoid any irritants in the air. If your signs and symptoms do not improve after  taking these steps, call your health care provider right away. Green zone Symptoms in this zone mean that you are doing well. They include: Being able to do your usual activities and exercise. Having the usual amount of coughing, including the same amount of phlegm or mucus. Being able to sleep well. Having a good appetite. Where to find more information: You can find more  information about COPD from: American Lung Association, My COPD Action Plan: www.lung.org COPD Foundation: www.copdfoundation.org National Heart, Lung, & Blood Institute: [employer] Follow these instructions at home: Continue taking your daily medicines as told by your health care provider. Make sure you receive all the immunizations that your health care provider recommends, especially the pneumococcal and influenza vaccines. Wash your hands often with soap and water . Have family members wash their hands too. Regular hand washing can help prevent infections. Follow your usual exercise and diet plan. Avoid irritants in the air, such as smoke. Do not use any products that contain nicotine  or tobacco. These products include cigarettes, chewing tobacco, and vaping devices, such as e-cigarettes. If you need help quitting, ask your health care provider. Summary A COPD action plan tells you what to do when you have a flare (exacerbation) of chronic obstructive pulmonary disease (COPD). Follow each action plan for your symptoms. If you have any symptoms in the red zone, call emergency services (911 in the [place] or go to the nearest emergency room. This information is not intended to replace advice given to you by your health care provider. Make sure you discuss any questions you have with your health care provider. Document Revised: 02/02/2023 Document Reviewed: 02/02/2023 Elsevier Patient Education  2024 [employer].

## 2023-12-12 ENCOUNTER — Telehealth: Payer: Self-pay

## 2023-12-12 ENCOUNTER — Encounter: Payer: Self-pay | Admitting: Nurse Practitioner

## 2023-12-12 ENCOUNTER — Ambulatory Visit (INDEPENDENT_AMBULATORY_CARE_PROVIDER_SITE_OTHER): Admitting: Nurse Practitioner

## 2023-12-12 VITALS — BP 135/86 | HR 82 | Temp 98.6°F | Resp 17 | Ht 67.01 in | Wt 227.4 lb

## 2023-12-12 DIAGNOSIS — Z23 Encounter for immunization: Secondary | ICD-10-CM

## 2023-12-12 DIAGNOSIS — J9612 Chronic respiratory failure with hypercapnia: Secondary | ICD-10-CM

## 2023-12-12 DIAGNOSIS — J432 Centrilobular emphysema: Secondary | ICD-10-CM

## 2023-12-12 DIAGNOSIS — J9611 Chronic respiratory failure with hypoxia: Secondary | ICD-10-CM

## 2023-12-12 NOTE — Assessment & Plan Note (Signed)
 Continue to collaborate with pulmonary.  Tolerating O2, continue use of this.

## 2023-12-12 NOTE — Progress Notes (Signed)
 BP 135/86 (BP Location: Left Wrist, Patient Position: Sitting, Cuff Size: Normal)   Pulse 82   Temp 98.6 F (37 C) (Oral)   Resp 17   Ht 5' 7.01 (1.702 m)   Wt 227 lb 6.4 oz (103.1 kg)   SpO2 95% Comment: 3L  BMI 35.61 kg/m    Subjective:    Patient ID: Stacey Gross, female    DOB: [birth year], 63 y.o.   MRN: [id number]  HPI: JAYLENN Gross is a 63 y.o. female  Chief Complaint  Patient presents with   COPD    Feeling much better than last week but still having problems with the fatigue.    COPD Follow-up today for SOB and edema -- went to ER as recommended by PCP due to worsening symptoms.  Treated for exacerbation with nebulizer therapy, Prednisone , and Omnicef .  Labs recent visit overall reassuring, including BNP.  Has completed Prednisone , but this did push her sugars up to 300 to 400 range.  EKG in ER noted NSR and right ventricular hypertrophy with possible. Sees cardiology at [facility] on the 19th.  K+ in ER was 5.3.   COPD status: exacerbated Satisfied with current treatment?: yes Oxygen  use: yes -- currently using 3 L [place] Dyspnea frequency: at baseline, improved from what it was Cough frequency: still productive Rescue inhaler frequency:  using 3-4 nebs a day Limitation of activity: improving, is cleaning her house Productive cough: yes Last Spirometry: with pulmonary Pneumovax: Provided today Influenza: Up to Date  Relevant past medical, surgical, family and social history reviewed and updated as indicated. Interim medical history since our last visit reviewed. Allergies and medications reviewed and updated.  Review of Systems  Constitutional:  Positive for fatigue. Negative for activity change, appetite change, diaphoresis and fever.  Respiratory:  Positive for cough and shortness of breath (improving today). Negative for chest tightness and wheezing.   Cardiovascular:  Negative for chest pain, palpitations and leg swelling (improved).   Gastrointestinal: Negative.   Psychiatric/Behavioral: Negative.      Per HPI unless specifically indicated above     Objective:    BP 135/86 (BP Location: Left Wrist, Patient Position: Sitting, Cuff Size: Normal)   Pulse 82   Temp 98.6 F (37 C) (Oral)   Resp 17   Ht 5' 7.01 (1.702 m)   Wt 227 lb 6.4 oz (103.1 kg)   SpO2 95% Comment: 3L  BMI 35.61 kg/m   Wt Readings from Last 3 Encounters:  12/12/23 227 lb 6.4 oz (103.1 kg)  12/05/23 234 lb 9.6 oz (106.4 kg)  11/17/23 225 lb (102.1 kg)    Physical Exam Vitals and nursing note reviewed.  Constitutional:      General: She is awake. She is not in acute distress.    Appearance: She is well-developed and well-groomed. She is obese. She is not ill-appearing or toxic-appearing.  HENT:     Head: Normocephalic.     Right Ear: Hearing and external ear normal.     Left Ear: Hearing and external ear normal.  Eyes:     General: Lids are normal.        Right eye: No discharge.        Left eye: No discharge.     Conjunctiva/sclera: Conjunctivae normal.     Pupils: Pupils are equal, round, and reactive to light.  Neck:     Thyroid : No thyromegaly.     Vascular: No carotid bruit.  Cardiovascular:     Rate  and Rhythm: Normal rate and regular rhythm.     Heart sounds: Normal heart sounds. No murmur heard.    No gallop.     Comments: No edema today, improved.  Down 7 lbs. Pulmonary:     Effort: Pulmonary effort is normal. No accessory muscle usage or respiratory distress.     Breath sounds: Wheezing present. No decreased breath sounds or rales.     Comments: Intermittent expiratory wheezes noted throughout, improved sounds from previous check. Abdominal:     General: Bowel sounds are normal. There is no distension.     Palpations: Abdomen is soft.     Tenderness: There is no abdominal tenderness.  Musculoskeletal:     Cervical back: Normal range of motion and neck supple.     Right lower leg: No edema.     Left lower leg: No  edema.  Lymphadenopathy:     Cervical: No cervical adenopathy.  Skin:    General: Skin is warm and dry.  Neurological:     Mental Status: She is alert and oriented to person, place, and time.     Deep Tendon Reflexes: Reflexes are normal and symmetric.     Reflex Scores:      Brachioradialis reflexes are 2+ on the right side and 2+ on the left side.      Patellar reflexes are 2+ on the right side and 2+ on the left side. Psychiatric:        Attention and Perception: Attention normal.        Mood and Affect: Mood normal.        Speech: Speech normal.        Behavior: Behavior normal. Behavior is cooperative.        Thought Content: Thought content normal.     Results for orders placed or performed in visit on 12/05/23  Comprehensive metabolic panel with GFR   Collection Time: 12/05/23 12:09 PM  Result Value Ref Range   Glucose 94 70 - 99 mg/dL   BUN 15 8 - 27 mg/dL   Creatinine, Ser 8.91 (H) 0.57 - 1.00 mg/dL   eGFR 57 (L) >40 fO/fpw/8.26   BUN/Creatinine Ratio 14 12 - 28   Sodium 140 134 - 144 mmol/L   Potassium 5.0 3.5 - 5.2 mmol/L   Chloride 95 (L) 96 - 106 mmol/L   CO2 30 (H) 20 - 29 mmol/L   Calcium  9.1 8.7 - 10.3 mg/dL   Total Protein 6.1 6.0 - 8.5 g/dL   Albumin 4.1 3.9 - 4.9 g/dL   Globulin, Total 2.0 1.5 - 4.5 g/dL   Bilirubin Total 0.3 0.0 - 1.2 mg/dL   Alkaline Phosphatase 84 44 - 121 IU/L   AST 11 0 - 40 IU/L   ALT 6 0 - 32 IU/L  TSH   Collection Time: 12/05/23 12:09 PM  Result Value Ref Range   TSH 0.692 0.450 - 4.500 uIU/mL  B Nat Peptide   Collection Time: 12/05/23 12:09 PM  Result Value Ref Range   BNP 67.0 0.0 - 100.0 pg/mL  CBC with Differential/Platelet   Collection Time: 12/05/23 12:09 PM  Result Value Ref Range   WBC 8.1 3.4 - 10.8 x10E3/uL   RBC 4.07 3.77 - 5.28 x10E6/uL   Hemoglobin 11.7 11.1 - 15.9 g/dL   Hematocrit 62.2 65.9 - 46.6 %   MCV 93 79 - 97 fL   MCH 28.7 26.6 - 33.0 pg   MCHC 31.0 (L) 31.5 - 35.7 g/dL  RDW 12.5 11.7 - 15.4  %   Platelets 220 150 - 450 x10E3/uL   Neutrophils 66 Not Estab. %   Lymphs 25 Not Estab. %   Monocytes 6 Not Estab. %   Eos 1 Not Estab. %   Basos 1 Not Estab. %   Neutrophils Absolute 5.5 1.4 - 7.0 x10E3/uL   Lymphocytes Absolute 2.0 0.7 - 3.1 x10E3/uL   Monocytes Absolute 0.5 0.1 - 0.9 x10E3/uL   EOS (ABSOLUTE) 0.1 0.0 - 0.4 x10E3/uL   Basophils Absolute 0.1 0.0 - 0.2 x10E3/uL   Immature Granulocytes 1 Not Estab. %   Immature Grans (Abs) 0.1 0.0 - 0.1 x10E3/uL      Assessment & Plan:   Problem List Items Addressed This Visit       Respiratory   Chronic respiratory failure with hypoxia and hypercapnia (HCC)   Continue to collaborate with pulmonary.  Tolerating O2, continue use of this.      Relevant Orders   Potassium   Centrilobular emphysema (HCC) - Primary   Chronic, recent exacerbation improving.  Suspect some acute HF was presenting with this, now weight coming down and symptoms overall improving with treatment.   - Will continue Bevespi  to allow for LABA/LAMA on board.  Discussed at length with her.  Continue Albuterol  as needed + nebulizer. - Recommend complete cessation of smoking.   - Continue annual CT scans.   - Continue collaboration with pulmonary, recent note reviewed. Recommend she schedule follow-up with them.   - Continue to wear oxygen  consistently at 3L [place].  SABRA        Relevant Medications   albuterol  (PROVENTIL ) (2.5 MG/3ML) 0.083% nebulizer solution   Other Relevant Orders   Potassium   Other Visit Diagnoses       Pneumococcal vaccination given       PCV20 in office today.   Relevant Orders   Pneumococcal conjugate vaccine 20-valent (Prevnar 20)        Follow up plan: Return in about 2 months (around 02/22/2024) for T2DM, HTN/HLD, COPD, MOOD, PAIN.

## 2023-12-12 NOTE — Progress Notes (Signed)
   12/12/2023  Patient ID: Stacey Gross, female   DOB: 04/25/1960, 63 y.o.   MRN: [id number]  In basket message from patient's PCP, Melanie Polio, NP, stating she saw patient today; and she is having a hard time keeping Libre 3+ sensors on longer than 6 days at a time.  No Libre sensors come with adhesive covers, but Dexcom G7 does.  I have sent an order to switch patient to Dexcom G7 via [employer], but I am not sure if insurance will cover since they have covered a Libre 3 reader this year and just refilled a 3 month supply of Libre 3+ sensors in August.  If Dexcom G7 is not approved, I informed patient I will see what local retailers sell adhesive covers for Libre 3+ (she does not order online).  Stacey Gross, PharmD, DPLA

## 2023-12-12 NOTE — Assessment & Plan Note (Signed)
 Chronic, recent exacerbation improving.  Suspect some acute HF was presenting with this, now weight coming down and symptoms overall improving with treatment.   - Will continue Bevespi  to allow for LABA/LAMA on board.  Discussed at length with her.  Continue Albuterol  as needed + nebulizer. - Recommend complete cessation of smoking.   - Continue annual CT scans.   - Continue collaboration with pulmonary, recent note reviewed. Recommend she schedule follow-up with them.   - Continue to wear oxygen  consistently at 3L [place].  SABRA

## 2023-12-13 ENCOUNTER — Ambulatory Visit: Payer: Self-pay | Admitting: Nurse Practitioner

## 2023-12-13 LAB — POTASSIUM: Potassium: 4.2 mmol/L (ref 3.5–5.2)

## 2023-12-13 NOTE — Progress Notes (Signed)
 Results reviewed with patient during yesterdays appointment.

## 2023-12-13 NOTE — Progress Notes (Signed)
 Please let Stacey Gross know potassium level is normal this check.  No changes needed. Enjoy the mountains!!

## 2023-12-14 ENCOUNTER — Telehealth: Payer: Self-pay

## 2023-12-14 NOTE — Progress Notes (Signed)
   12/14/2023  Patient ID: Stacey Gross, female   DOB: 12-25-1960, 63 y.o.   MRN: [id number]  Order for Dexcom G7 could not be fulfilled by DME supplier through [place], because part D covers patient's prescriptions.  Where Libre 3 was just filled with a 90 day supply of 3+ sensors, they will not cover Dexcom at this time.  I have contacted area medical supply stores and pharmacies, and none of them keep lLibre sensor covers in stock.  The [facility] website suggests clear Tegaderm adhesive to help keep sensors in place.  Contacted Ms. Shepard, and she states her current sensor has been on 12 days now with no issue.  I advised on making sure the area is clean and dry with no lotion, oils, etc before placing sensor.  I also recommended purchasing Tegaderm if needed.  If she does continue to have issues with sensors staying on, I will see if insurance will cover Dexcom since these come with adhesive covers when a refill on sensors is due.  Stacey Gross, PharmD, DPLA

## 2023-12-25 ENCOUNTER — Telehealth: Payer: Self-pay

## 2023-12-25 NOTE — Telephone Encounter (Unsigned)
 Copied from CRM [id number]. Topic: Clinical - Request for Lab/Test Order >> Dec 25, 2023  9:49 AM Suzen RAMAN wrote: Reason for CRM: Patient sister would like a call back pertaining to whether patient is able to is have an Early parkinson diease test completed.    Newell Rase [phone]

## 2023-12-26 NOTE — Telephone Encounter (Addendum)
 Discussed with patient. She will wait to talk about during her scheduled visit. She did go on to say this weekend she did fall this past weekend while [place] the mountains. When she fell she was very bruised and very bloody. When she fell she says she did not remember anything or even being in the mountains. She said also that she was talking to people who were not there.

## 2023-12-26 NOTE — Telephone Encounter (Signed)
 With patients DPR and stated permission to discuss with sister Newell. She is aware that if these episodes were to occur again her sister needs to either go by EMS to the ED or be transported to the ED.

## 2023-12-28 DIAGNOSIS — Z961 Presence of intraocular lens: Secondary | ICD-10-CM | POA: Diagnosis not present

## 2023-12-28 DIAGNOSIS — E119 Type 2 diabetes mellitus without complications: Secondary | ICD-10-CM | POA: Diagnosis not present

## 2023-12-28 DIAGNOSIS — H353132 Nonexudative age-related macular degeneration, bilateral, intermediate dry stage: Secondary | ICD-10-CM | POA: Diagnosis not present

## 2024-01-02 DIAGNOSIS — J9611 Chronic respiratory failure with hypoxia: Secondary | ICD-10-CM | POA: Diagnosis not present

## 2024-01-08 NOTE — Progress Notes (Unsigned)
   01/09/2024  Patient ID: Stacey Gross Spanner, female   DOB: [birth year], 63 y.o.   MRN: [id number]  Subjective/Objective: Telephone visit to follow-up on management of chronic conditions  Diabetes -Current medications:  Januvia  100mg  daily, Tresiba  20 units at bedtime -Did not tolerate metformin  (significant diarrhea), GLP1's (significant GI upset), or Farxiga  (yeast infections) in the past -Using Libre3 for CGM -Patient does monitor home FBG and endorses values averaging 90-110 -A1c in August was down to 6.9%  -Does endorse 1 instance of hypoglycemia where her sensor alerted her that BG was below 70, so she had some orange juice, and this resoled -Endorses affordability of Tresiba  and all medications, stating she does not have copays for her prescriptions  Hypertension -Current medications:  furosemide  40mg  daily, olmesartan  5mg  daily, verapamil  80mg  daily -Patient does monitor home BP but uses automated wrist monitor, because she states upper arm cuffs bruise her arm -She endorses home BP is usually 170/70; however, last office visit BP on 9/9 was 135/86- likely that readings on home wrist monitor are not accurate  ASCVD -Current medications:  rosuvastatin  40mg  daily/ -LDL 11/17/23 was 67 -Patient reports incidence of fall from possible loss of conciousness a couple of weekends ago while visit the mountains with her sister.  She states she had double vision for a while around this incident, and she does not remember the majority of the weekend.  She did no seek medical attention for this. -Followed by cardiology with an upcoming visit 10/17  COPD -Current medications:  albuterol  HFA as needed, Bevespi  2 puffs daily, albuterol  nebulizer solution as needed -Recent exacerbation in September -Patient endorses improvements in symptoms since beginning abx and steroid -Good adherence to Bevespi  per patient  -Did not tolerate Breztri  (thrush) in the past -On 3L of oxygen  at home    Assessment/Plan   Diabetes -Controlled based on A1c -Patient would likely benefit from GLP1 in regard to diabetes control and cardiorenal protection.  Medication and patient reported history only reflect use of Ozempic  in the past, and this medication was not tolerated well due to significant n/v.  Could try Rybelsus  at 3mg  daily, increasing after 1 month to 7mg  daily if patient tolerates.  This would replace Januvia  100mg  daily.  If this change was made, we may need to adjust insulin  dosing.  Hypertension -Uncontrolled -I will see if patient's insurance will cover a new upper arm BP monitor with a XL cuff to prevent bruising of the upper arm -Continue current regimen at this time -I recommend checking home BP daily 30 minutes to 1 hour after taking morning BP medications -If BP consistently >130/80, consider increasing olmesartan  dose with follow-up CMP 2 weeks after  ASCVD -Continue current regimen at this time -Do not miss upcoming cardiology visit on 10/17 -Concern that patient may have experienced TIA based on reported events a few weeks ago (documented in subjective portion of note).  Might could also benefit from neurological work up.  Consulting PCP and cardiologist to make them aware. -Advised patient to seek immediate mediation attention if this happens again  COPD -Continue current regimen at this time -Do not miss doses of Bevespi  -Always make sure you have albuterol  rescue inhaler and nebulizer solution on hand to use as needed -Continue to follow-up with pulmonology   Follow-up:  Will notify patient about new home BP monitor and a follow-up telephone visit is scheduled for 12/15   Channing Gross Mealing, PharmD, DPLA

## 2024-01-09 ENCOUNTER — Ambulatory Visit: Admitting: Podiatry

## 2024-01-09 ENCOUNTER — Other Ambulatory Visit: Payer: Self-pay

## 2024-01-09 DIAGNOSIS — E1151 Type 2 diabetes mellitus with diabetic peripheral angiopathy without gangrene: Secondary | ICD-10-CM

## 2024-01-09 DIAGNOSIS — E1159 Type 2 diabetes mellitus with other circulatory complications: Secondary | ICD-10-CM

## 2024-01-09 DIAGNOSIS — E1169 Type 2 diabetes mellitus with other specified complication: Secondary | ICD-10-CM

## 2024-01-09 DIAGNOSIS — J432 Centrilobular emphysema: Secondary | ICD-10-CM

## 2024-01-10 ENCOUNTER — Telehealth: Payer: Self-pay

## 2024-01-10 NOTE — Progress Notes (Signed)
   01/10/2024  Patient ID: Dagoberto DELENA Spanner, female   DOB: 02-04-1961, 63 y.o.   MRN: [id number]  Contacted patient's [facility] insurance plan to inquire about OTC benefit that may cover BP monitor.  Patient is able to [facility] OTC benefit to purchase a blood pressure monitor at [facility], [facility], [employer], [employer], or [employer].  Currently $75 is available on her account, and this amount is deposited every month.  Contacted patient to make her aware, and she plans to use funds to purchase upper-arm, automated monitor with a L/XL cuff.  [name], PharmD, DPLA

## 2024-01-16 ENCOUNTER — Encounter: Payer: Self-pay | Admitting: Podiatry

## 2024-01-16 ENCOUNTER — Ambulatory Visit: Admitting: Podiatry

## 2024-01-16 VITALS — Ht 67.0 in | Wt 227.4 lb

## 2024-01-16 DIAGNOSIS — L600 Ingrowing nail: Secondary | ICD-10-CM

## 2024-01-16 DIAGNOSIS — B351 Tinea unguium: Secondary | ICD-10-CM

## 2024-01-16 DIAGNOSIS — M79675 Pain in left toe(s): Secondary | ICD-10-CM | POA: Diagnosis not present

## 2024-01-16 DIAGNOSIS — M79674 Pain in right toe(s): Secondary | ICD-10-CM | POA: Diagnosis not present

## 2024-01-16 NOTE — Progress Notes (Signed)
 Chief Complaint  Patient presents with   Nail Problem    Pt is here due to bilateral toenail issues states she has toenail on both feet that needs to come and or has ingrown, not sure which ones. Also would like to have heels of the feet shave due to callous.    Subjective: Patient presents today for evaluation of pain to the medial border right great toe. Patient is concerned for possible ingrown nail.  It is very sensitive to touch.  Patient presents today for further treatment and evaluation.  Past Medical History:  Diagnosis Date   (HFpEF) heart failure with preserved ejection fraction (HCC)    Asthma    Cancer (HCC)    stomach   Chronic back pain    COPD (chronic obstructive pulmonary disease) (HCC)    Degenerative joint disease (DJD) of hip    Diabetes mellitus without complication (HCC)    Dyspnea    GERD (gastroesophageal reflux disease)    takes Omeprazole  daily   History of bronchitis yrs ago   History of colon polyps    benign   History of staph infection 10 yrs ago   Hyperlipidemia    takes Atorvastatin  daily   Hypertension    Palpitations    Peripheral vascular disease    Tobacco use    8 cigarettes daily    Weakness    numbness and tingling in both legs    Wears dentures    full upper    Past Surgical History:  Procedure Laterality Date   APPENDECTOMY     BACK SURGERY  9-10 yrs ago   BREAST BIOPSY Right    Core- neg   COLONOSCOPY     COLONOSCOPY WITH PROPOFOL  N/A 09/20/2017   Procedure: COLONOSCOPY WITH PROPOFOL ;  Surgeon: Janalyn Keene NOVAK, MD;  Location: [facility] ENDOSCOPY;  Service: Endoscopy;  Laterality: N/A;   COLONOSCOPY WITH PROPOFOL  N/A 04/24/2018   Procedure: COLONOSCOPY WITH PROPOFOL ;  Surgeon: Jinny Carmine, MD;  Location: [facility] ENDOSCOPY;  Service: Endoscopy;  Laterality: N/A;   COLONOSCOPY WITH PROPOFOL  N/A 06/10/2021   Procedure: COLONOSCOPY WITH BIOPSY;  Surgeon: Jinny Carmine, MD;  Location: [facility]ERY [facility];  Service: Endoscopy;   Laterality: N/A;   ESOPHAGEAL DILATION  06/10/2021   Procedure: ESOPHAGEAL DILATION;  Surgeon: Jinny Carmine, MD;  Location: [facility];  Service: Endoscopy;;  15-18 mm   ESOPHAGOGASTRODUODENOSCOPY (EGD) WITH PROPOFOL  N/A 09/20/2017   Procedure: ESOPHAGOGASTRODUODENOSCOPY (EGD) WITH PROPOFOL ;  Surgeon: Janalyn Keene NOVAK, MD;  Location: [facility] ENDOSCOPY;  Service: Endoscopy;  Laterality: N/A;   ESOPHAGOGASTRODUODENOSCOPY (EGD) WITH PROPOFOL  N/A 11/20/2018   Procedure: ESOPHAGOGASTRODUODENOSCOPY (EGD) WITH PROPOFOL ;  Surgeon: Jinny Carmine, MD;  Location: [facility] ENDOSCOPY;  Service: Endoscopy;  Laterality: N/A;   ESOPHAGOGASTRODUODENOSCOPY (EGD) WITH PROPOFOL  N/A 06/10/2021   Procedure: ESOPHAGOGASTRODUODENOSCOPY (EGD) WITH BIOPSY;  Surgeon: Jinny Carmine, MD;  Location: [facility] SUR[facility]ERY [facility];  Service: Endoscopy;  Laterality: N/A;  Diabetic Latex   EYE SURGERY Bilateral    cataract extraction   PERIPHERAL VASCULAR CATHETERIZATION Left 03/17/2016   Procedure: Lower Extremity Angiography;  Surgeon: Selinda GORMAN Gu, MD;  Location: [facility] INVASIVE CV LAB;  Service: Cardiovascular;  Laterality: Left;   POLYPECTOMY N/A 06/10/2021   Procedure: POLYPECTOMY;  Surgeon: Jinny Carmine, MD;  Location: [facility] SURGERY [facility]TR;  Service: Endoscopy;  Laterality: N/A;   TOTAL ABDOMINAL HYSTERECTOMY W/ BILATERAL SALPINGOOPHORECTOMY     total   TOTAL SHOULDER ARTHROPLASTY Right 07/24/2019   Procedure: RIGHT TOTAL SHOULDER ARTHROPLASTY;  Surgeon: Leora Lynwood SAUNDERS,  MD;  Location: [facility] ORS;  Service: Orthopedics;  Laterality: Right;    Allergies  Allergen Reactions   Doxycycline  Hyclate Anaphylaxis, Swelling and Rash   Acetaminophen  Swelling    Tongue swelling and breaks out in welts   Aspirin  Swelling    Tongue swelling and breaks out in welts   Latex Rash    Gloves, underwear elastic, tape   Vancomycin Rash    All mycins    Objective:  General: Well developed, nourished, in no acute distress, alert and  oriented x3   Dermatology: Skin is warm, dry and supple bilateral.  Medial border right great toe is tender with evidence of an ingrowing nail. Pain on palpation noted to the border of the nail fold.  Remaining nails demonstrate hyperkeratotic dystrophy with associated tenderness consistent with fungal nail infection.  Hyperkeratotic plantar callus noted to the right heel  Vascular: DP and PT pulses palpable.  No clinical evidence of vascular compromise  Neruologic: Grossly intact via light touch bilateral.  Musculoskeletal: No pedal deformity noted  Assesement: #1 Paronychia with ingrowing nail medial border right great toe #2 pain due to onychomycosis of toenails both #3 symptomatic callus right plantar heel  Plan of Care:  -Patient evaluated.  Mechanical debridement of nails 1-5 bilateral performed using a nail nipper without incident or bleeding -Excisional debridement of the hyperkeratotic callus to the right plantar heel was performed using a 312 scalpel without incident or bleeding.  Advised against going barefoot.  Recommend good supportive house shoes -Discussed treatment alternatives and plan of care. Explained nail avulsion procedure and post procedure course to patient. -Patient opted for permanent partial nail avulsion of the ingrown portion of the nail.  -Prior to procedure, local anesthesia infiltration utilized using 3 ml of a 50:50 mixture of 2% plain lidocaine  and 0.5% plain marcaine  in a normal hallux block fashion and a betadine prep performed.  -Partial permanent nail avulsion with chemical matrixectomy performed using 3x30sec applications of phenol followed by alcohol flush.  -Light dressing applied.  Post care instructions provided -Return to clinic 3 weeks  Thresa EMERSON Sar, DPM [facility]  Dr. Thresa EMERSON Sar, DPM    [address] [place], [place] [address]                Office [phone]  Fax [phone]

## 2024-01-30 DIAGNOSIS — I5032 Chronic diastolic (congestive) heart failure: Secondary | ICD-10-CM | POA: Diagnosis not present

## 2024-01-30 DIAGNOSIS — E782 Mixed hyperlipidemia: Secondary | ICD-10-CM | POA: Diagnosis not present

## 2024-01-30 DIAGNOSIS — R002 Palpitations: Secondary | ICD-10-CM | POA: Diagnosis not present

## 2024-01-30 DIAGNOSIS — I10 Essential (primary) hypertension: Secondary | ICD-10-CM | POA: Diagnosis not present

## 2024-02-02 DIAGNOSIS — J449 Chronic obstructive pulmonary disease, unspecified: Secondary | ICD-10-CM | POA: Diagnosis not present

## 2024-02-02 DIAGNOSIS — J9611 Chronic respiratory failure with hypoxia: Secondary | ICD-10-CM | POA: Diagnosis not present

## 2024-02-06 ENCOUNTER — Encounter: Payer: Self-pay | Admitting: Podiatry

## 2024-02-06 ENCOUNTER — Ambulatory Visit: Admitting: Podiatry

## 2024-02-06 VITALS — Ht 67.0 in | Wt 227.4 lb

## 2024-02-06 DIAGNOSIS — L600 Ingrowing nail: Secondary | ICD-10-CM

## 2024-02-06 NOTE — Patient Instructions (Signed)

## 2024-02-06 NOTE — Progress Notes (Signed)
 Chief Complaint  Patient presents with   Ingrown Toenail    Pt is here to f/u on right great toenail after having ingrown removed, states the toe is still sore, and complains of left great toenail believes she has another ingrown.    Subjective: Patient presents today for evaluation of pain to the medial border left great toe. Patient is concerned for possible ingrown nail.  It is very sensitive to touch.  History of partial nail matricectomy last visit on 01/16/2024 to the right great toe.  It is healing well.  Patient presents today for further treatment and evaluation.  Past Medical History:  Diagnosis Date   (HFpEF) heart failure with preserved ejection fraction (HCC)    Asthma    Cancer (HCC)    stomach   Chronic back pain    COPD (chronic obstructive pulmonary disease) (HCC)    Degenerative joint disease (DJD) of hip    Diabetes mellitus without complication (HCC)    Dyspnea    GERD (gastroesophageal reflux disease)    takes Omeprazole  daily   History of bronchitis yrs ago   History of colon polyps    benign   History of staph infection 10 yrs ago   Hyperlipidemia    takes Atorvastatin  daily   Hypertension    Palpitations    Peripheral vascular disease    Tobacco use    8 cigarettes daily    Weakness    numbness and tingling in both legs    Wears dentures    full upper    Past Surgical History:  Procedure Laterality Date   APPENDECTOMY     BACK SURGERY  9-10 yrs ago   BREAST BIOPSY Right    Core- neg   COLONOSCOPY     COLONOSCOPY WITH PROPOFOL  N/A 09/20/2017   Procedure: COLONOSCOPY WITH PROPOFOL ;  Surgeon: Janalyn Keene NOVAK, MD;  Location: [facility] ENDOSCOPY;  Service: Endoscopy;  Laterality: N/A;   COLONOSCOPY WITH PROPOFOL  N/A 04/24/2018   Procedure: COLONOSCOPY WITH PROPOFOL ;  Surgeon: Jinny Carmine, MD;  Location: [facility] ENDOSCOPY;  Service: Endoscopy;  Laterality: N/A;   COLONOSCOPY WITH PROPOFOL  N/A 06/10/2021   Procedure: COLONOSCOPY WITH BIOPSY;   Surgeon: Jinny Carmine, MD;  Location: [facility];  Service: Endoscopy;  Laterality: N/A;   ESOPHAGEAL DILATION  06/10/2021   Procedure: ESOPHAGEAL DILATION;  Surgeon: Jinny Carmine, MD;  Location: [facility] SUR[facility]ERY [facility];  Service: Endoscopy;;  15-18 mm   ESOPHAGOGASTRODUODENOSCOPY (EGD) WITH PROPOFOL  N/A 09/20/2017   Procedure: ESOPHAGOGASTRODUODENOSCOPY (EGD) WITH PROPOFOL ;  Surgeon: Janalyn Keene NOVAK, MD;  Location: [facility] ENDOSCOPY;  Service: Endoscopy;  Laterality: N/A;   ESOPHAGOGASTRODUODENOSCOPY (EGD) WITH PROPOFOL  N/A 11/20/2018   Procedure: ESOPHAGOGASTRODUODENOSCOPY (EGD) WITH PROPOFOL ;  Surgeon: Jinny Carmine, MD;  Location: [facility] ENDOSCOPY;  Service: Endoscopy;  Laterality: N/A;   ESOPHAGOGASTRODUODENOSCOPY (EGD) WITH PROPOFOL  N/A 06/10/2021   Procedure: ESOPHAGOGASTRODUODENOSCOPY (EGD) WITH BIOPSY;  Surgeon: Jinny Carmine, MD;  Location: [facility];  Service: Endoscopy;  Laterality: N/A;  Diabetic Latex   EYE SURGERY Bilateral    cataract extraction   PERIPHERAL VASCULAR CATHETERIZATION Left 03/17/2016   Procedure: Lower Extremity Angiography;  Surgeon: Selinda GORMAN Gu, MD;  Location: [facility] INVAS[facility] CV LAB;  Service: Cardiovascular;  Laterality: Left;   POLYPECTOMY N/A 06/10/2021   Procedure: POLYPECTOMY;  Surgeon: Jinny Carmine, MD;  Location: [facility] SUR[facility]ERY [facility];  Service: Endoscopy;  Laterality: N/A;   TOTAL ABDOMINAL HYSTERECTOMY W/ BILATERAL SALPINGOOPHORECTOMY     total   TOTAL SHOULDER ARTHROPLASTY Right 07/24/2019  Procedure: RIGHT TOTAL SHOULDER ARTHROPLASTY;  Surgeon: Leora Lynwood SAUNDERS, MD;  Location: [facility] ORS;  Service: Orthopedics;  Laterality: Right;    Allergies  Allergen Reactions   Doxycycline  Hyclate Anaphylaxis, Swelling and Rash   Acetaminophen  Swelling    Tongue swelling and breaks out in welts   Aspirin  Swelling    Tongue swelling and breaks out in welts   Latex Rash    Gloves, underwear elastic, tape   Vancomycin Rash    All mycins     Objective:  General: Well developed, nourished, in no acute distress, alert and oriented x3   Dermatology: Skin is warm, dry and supple bilateral.  Medial border left great toe is tender with evidence of an ingrowing nail. Pain on palpation noted to the border of the nail fold. The remaining nails appear unremarkable at this time.  Right great toe medial border nail avulsion site appears to be healing routinely.  No drainage.  Overall well-healing nail matricectomy site  Vascular: DP and PT pulses palpable.  No clinical evidence of vascular compromise  Neruologic: Grossly intact via light touch bilateral.  Musculoskeletal: No pedal deformity noted  Assesement: #1 Paronychia with ingrowing nail medial border left great toe #2 H/o partial nail matricectomy medial border RT great toe.  01/16/2024  Plan of Care:  -Patient evaluated.  -Discussed treatment alternatives and plan of care. Explained nail avulsion procedure and post procedure course to patient. -Patient opted for permanent partial nail avulsion of the ingrown portion of the nail.  -Prior to procedure, local anesthesia infiltration utilized using 3 ml of a 50:50 mixture of 2% plain lidocaine  and 0.5% plain marcaine  in a normal hallux block fashion and a betadine prep performed.  -Partial permanent nail avulsion with chemical matrixectomy performed using 3x30sec applications of phenol followed by alcohol flush.  -Light dressing applied.  Post care instructions provided -Return to clinic 3 weeks.  At this time she would like to schedule routine footcare every 3 months with Dr. May Thresa EMERSON Janit, DPM [facility]  Dr. Thresa EMERSON Janit, DPM    [address] [place], [place] [address]                Office [phone]  Fax [phone]

## 2024-02-10 NOTE — Patient Instructions (Signed)

## 2024-02-13 ENCOUNTER — Encounter: Payer: Self-pay | Admitting: Nurse Practitioner

## 2024-02-13 ENCOUNTER — Ambulatory Visit (INDEPENDENT_AMBULATORY_CARE_PROVIDER_SITE_OTHER): Admitting: Nurse Practitioner

## 2024-02-13 VITALS — BP 106/71 | HR 90 | Temp 98.6°F | Resp 15 | Ht 67.01 in | Wt 227.0 lb

## 2024-02-13 DIAGNOSIS — E1169 Type 2 diabetes mellitus with other specified complication: Secondary | ICD-10-CM | POA: Diagnosis not present

## 2024-02-13 DIAGNOSIS — J9612 Chronic respiratory failure with hypercapnia: Secondary | ICD-10-CM | POA: Diagnosis not present

## 2024-02-13 DIAGNOSIS — E66811 Obesity, class 1: Secondary | ICD-10-CM

## 2024-02-13 DIAGNOSIS — J432 Centrilobular emphysema: Secondary | ICD-10-CM

## 2024-02-13 DIAGNOSIS — E1159 Type 2 diabetes mellitus with other circulatory complications: Secondary | ICD-10-CM

## 2024-02-13 DIAGNOSIS — F411 Generalized anxiety disorder: Secondary | ICD-10-CM

## 2024-02-13 DIAGNOSIS — Z7409 Other reduced mobility: Secondary | ICD-10-CM | POA: Insufficient documentation

## 2024-02-13 DIAGNOSIS — E1151 Type 2 diabetes mellitus with diabetic peripheral angiopathy without gangrene: Secondary | ICD-10-CM | POA: Diagnosis not present

## 2024-02-13 DIAGNOSIS — I5032 Chronic diastolic (congestive) heart failure: Secondary | ICD-10-CM

## 2024-02-13 DIAGNOSIS — F17219 Nicotine dependence, cigarettes, with unspecified nicotine-induced disorders: Secondary | ICD-10-CM

## 2024-02-13 DIAGNOSIS — J9611 Chronic respiratory failure with hypoxia: Secondary | ICD-10-CM

## 2024-02-13 DIAGNOSIS — E6609 Other obesity due to excess calories: Secondary | ICD-10-CM

## 2024-02-13 DIAGNOSIS — R413 Other amnesia: Secondary | ICD-10-CM | POA: Insufficient documentation

## 2024-02-13 DIAGNOSIS — R251 Tremor, unspecified: Secondary | ICD-10-CM | POA: Insufficient documentation

## 2024-02-13 DIAGNOSIS — I75023 Atheroembolism of bilateral lower extremities: Secondary | ICD-10-CM

## 2024-02-13 LAB — BAYER DCA HB A1C WAIVED: HB A1C (BAYER DCA - WAIVED): 6.9 % — ABNORMAL HIGH (ref 4.8–5.6)

## 2024-02-13 MED ORDER — TRAZODONE HCL 100 MG PO TABS: 100.0000 mg | ORAL_TABLET | Freq: Every evening | ORAL | 4 refills | Status: AC | PRN

## 2024-02-13 MED ORDER — PREGABALIN 75 MG PO CAPS
75.0000 mg | ORAL_CAPSULE | Freq: Two times a day (BID) | ORAL | 0 refills | Status: AC
Start: 2024-02-13 — End: ?

## 2024-02-13 MED ORDER — PROPRANOLOL HCL 20 MG PO TABS: 20.0000 mg | ORAL_TABLET | Freq: Every day | ORAL | 3 refills | Status: DC

## 2024-02-13 MED ORDER — NYSTATIN 100000 UNIT/ML MT SUSP: 5.0000 mL | Freq: Four times a day (QID) | OROMUCOSAL | 2 refills | Status: AC

## 2024-02-13 MED ORDER — VERAPAMIL HCL 80 MG PO TABS: 80.0000 mg | ORAL_TABLET | Freq: Every day | ORAL | 4 refills | Status: DC

## 2024-02-13 MED ORDER — OLMESARTAN MEDOXOMIL 5 MG PO TABS: 5.0000 mg | ORAL_TABLET | Freq: Every day | ORAL | 4 refills | Status: AC

## 2024-02-13 NOTE — Progress Notes (Signed)
 BP 106/71 (BP Location: Left Arm, Patient Position: Sitting, Cuff Size: Large)   Pulse 90   Temp 98.6 F (37 C) (Oral)   Resp 15   Ht 5' 7.01 (1.702 m)   Wt 227 lb (103 kg)   SpO2 96%   BMI 35.54 kg/m    Subjective:    Patient ID: Stacey Gross, female    DOB: Jan 30, 1961, 63 y.o.   MRN: [id number]  HPI: Stacey Gross is a 63 y.o. female  Chief Complaint  Patient presents with   Diabetes    Sensor stopped working and needs sample. Does have days she stays low around the 50's.    HTN/HLD    No concerns.    COPD    Getting a lot of headaches recently.    Mood    Doesn't want to be around people at times.   [employer] scooter    Would like an order placed to allow her to be more mobile at home and get out in the community. Prefers [employer] if possible.    Alyse Beau, at bedside to assist with HPI.  DIABETES August A1c 6.9%.  Continues Januvia  and Tresiba . Did not give her insulin  for a couple nights as was running 90 range and has had some 50's.  HISTORY: Metformin  caused increase in bowel movements and could not leave house. GLP1 medications caused major stomach issues and Farxiga  caused yeast infections.  Hypoglycemic episodes: yes Polydipsia/polyuria: no Visual disturbance: no Chest pain: no Paresthesias: no Glucose Monitoring: yes             Accucheck frequency: a few times a day             Fasting glucose: 110 range             Post prandial:             Evening: 100 range             Before meals: Taking Insulin ?: no             Long acting insulin : 20 units Tresiba  - refer to above             Short acting insulin : Blood Pressure Monitoring: not checking Retinal Examination: Up To Date -- [place] Foot Exam: Up to Date Pneumovax: Up to Date Influenza: Not Up to Date -- refuses  Aspirin : no    HYPERTENSION / HYPERLIPIDEMIA/CHF Taking Olmesartan  5 MG, Verapamil  80 MG daily, Lasix  20 MG, and Crestor  40 MG daily. EF 05/02/23 was  60-65%. Last cardiology visit was on 01/30/24, [facility] for 2nd opinion -- a heart monitor was ordered. Has been getting more SOB walking, can barely walk 20 to 30 feet. At baseline sleeps on 3 pillows at night. Has cut back a lot on salami. Reports dissatisfaction with recent cardiology visit and does not know if will return.  They would like testing for early stages of dementia vs Parkinson's. Has the shakes, ongoing for months and memory changes being noticed by [facility]. Difficulty with short term memory and recall noted at home. Whole body shakes and occasional jerks, no seizures. Beau reports she has difficulty getting food to mouth and drinking.   Did see vascular [facility] for second opinion on PVD/blue toe syndrome on 07/12/23 with no changes and there is nothing they can do for her syndrome she reports. Her toes occasionally turn blue and blister, then clear up.   Satisfied with current treatment? yes  Duration of hypertension: chronic BP monitoring frequency: daily BP range: 111/70 range BP medication side effects: no Duration of hyperlipidemia: chronic Cholesterol medication side effects: no Cholesterol supplements: none Medication compliance: good compliance Aspirin : no Recent stressors: no Recurrent headaches: no Visual changes: no Palpitations: all the time -- had recent heart monitor done with cardiology Dyspnea: yes at baseline Chest pain: no Lower extremity edema: occasional Dizzy/lightheaded: yes    02/13/2024    9:04 AM 06/27/2023   11:34 AM 05/23/2022   11:17 AM 05/15/2020   11:19 AM 02/01/2018    2:33 PM  6CIT Screen  What Year? 0 points 0 points 0 points 0 points 0 points  What month? 0 points 0 points 0 points 0 points 0 points  What time? 0 points 0 points 0 points 0 points 0 points  Count back from 20 0 points 0 points 0 points 0 points 0 points  Months in reverse 2 points 0 points 0 points 4 points 0 points  Repeat phrase 0 points 0 points 0 points 6 points  0 points  Total Score 2 points 0 points 0 points 10 points 0 points   COPD Uses Bevespi  and Albuterol  -- off and on uses. Saw pulmonary on 08/17/23 last.  Uses oxygen  every day, 3 L.  Smokes nicotine  daily, 3/4 to 1/2 PPD.  Not interested in quitting -- has been smoking since age 84.  Lung CT screening 04/27/23 == emphysema and aortic atherosclerosis noted, to continue annually.  COPD status: stable Satisfied with current treatment?: yes Oxygen  use: yes Dyspnea frequency: as above Cough frequency: yes Rescue inhaler frequency: mostly at night will use Limitation of activity: no Productive cough: no Last Spirometry: with pulmonary Pneumovax: Up to Date Influenza: Up To Date  ANXIETY/STRESS & CHRONIC PAIN/POOR MOBILITY In past has used Wellbutrin , Duloxetine , Prozac , Celexa . Currently takes Lyrica  50 MG BID along with Cymbalta  for pain/mood with benefit and Buspar  for anxiety. Having more pain, especially if standing for long period.  Cannot stand at sink for 2 coffee cups and spoon.   Is requesting a [employer] Scooter order. History of multiple fusions to back. Has been unable to ambulate well for over a year, becomes fatigued easily due to COPD and has pain which impedes her mobility.  Patient can transfer independently.  A manual w/c would be difficult for her to utilize as well, she enjoys being active to her highest level and this would decrease her current independence as becomes fatigued easily with strenuous activity + has portable O2 she uses. Would be dependent on a scooter to assist with ADLs.  She has control of upper body and is able to maneuver an scooter + cognitively understands how to safely use the scooter.  Without an scooter her MRADLs would be significantly affected, as she would be unable to transfer to areas in her home to cook and bath + would not be able to attend provider appointments to assess her overall health.  She currently has difficulty performing home needs and getting  groceries due to mobility. Her mood has become greatly affected as she is unable to leave house often. A scooter would improve her overall quality of life and allow her easier mobility with her portable O2.  History: Chronic back pain present, cannot take Tylenol . Dr. Gillie performed recent posterior lumbar interbody fusion on 12/08/22 and she did follow-up with him recently, she reports no changes and has not returned for visit with them.  Severe spinal stenosis L4-L5.  Had  injections in past with no benefit. Does not want to go to a pain clinic. Duration: stable Anxious mood: occasional Excessive worrying: no Irritability: no Sweating: no Nausea: no Palpitations:no Hyperventilation: no Panic attacks: no Agoraphobia: no  Obscessions/compulsions: no Depressed mood: yes due to not being able to get out of house with poor mobility    02/19/2024    1:37 PM 06/27/2023   11:26 AM 05/04/2023   10:01 AM 02/01/2023    9:53 AM 09/01/2022    9:51 AM  Depression screen PHQ 2/9  Decreased Interest 3 3 0 3 0  Down, Depressed, Hopeless 3 1 0 3 0  PHQ - 2 Score 6 4 0 6 0  Altered sleeping 1 2 0 3 2  Tired, decreased energy 3 3 0 3 1  Change in appetite  3 0 3 1  Feeling bad or failure about yourself  0 0 0 0 0  Trouble concentrating 0 0 0 0 0  Moving slowly or fidgety/restless 1 0 0 1 0  Suicidal thoughts 0 0 0 0 0  PHQ-9 Score 11 12  0  16  4   Difficult doing work/chores Somewhat difficult Very difficult Not difficult at all Somewhat difficult Not difficult at all     Data saved with a previous flowsheet row definition  Anhedonia: no Weight changes: no Insomnia: yes hard to fall asleep -- due to pain Hypersomnia: no Fatigue/loss of energy: no Feelings of worthlessness: no Feelings of guilt: no Impaired concentration/indecisiveness: no Suicidal ideations: no  Crying spells: no Recent Stressors/Life Changes: no   Relationship problems: no   Family stress: no     Financial stress: no     Job stress: no    Recent death/loss: no    2024-02-19    1:37 PM 05/04/2023   10:01 AM 02/01/2023    9:54 AM 09/01/2022    9:52 AM  GAD 7 : Generalized Anxiety Score  Nervous, Anxious, on Edge 1 0 1 0  Control/stop worrying 1 0 1 0  Worry too much - different things 1 0 1 1  Trouble relaxing 0 0 0 0  Restless 0 0 0 0  Easily annoyed or irritable 1 0 2 2  Afraid - awful might happen 0 0 0 0  Total GAD 7 Score 4 0 5 3  Anxiety Difficulty Somewhat difficult Not difficult at all Somewhat difficult Not difficult at all   Relevant past medical, surgical, family and social history reviewed and updated as indicated. Interim medical history since our last visit reviewed. Allergies and medications reviewed and updated.  Review of Systems  Constitutional:  Negative for activity change, appetite change, diaphoresis, fatigue and fever.  Respiratory:  Positive for cough, shortness of breath (at baseline) and wheezing (at baseline). Negative for chest tightness.   Cardiovascular:  Positive for leg swelling (occasional, improved). Negative for chest pain and palpitations.  Gastrointestinal: Negative.   Endocrine: Negative for polydipsia, polyphagia and polyuria.  Musculoskeletal:  Positive for arthralgias.  Neurological:  Positive for tremors. Negative for dizziness, seizures, syncope, facial asymmetry, weakness, light-headedness, numbness and headaches.  Psychiatric/Behavioral:  Positive for sleep disturbance. Negative for decreased concentration, self-injury and suicidal ideas. The patient is nervous/anxious.    Per HPI unless specifically indicated above     Objective:    BP 106/71 (BP Location: Left Arm, Patient Position: Sitting, Cuff Size: Large)   Pulse 90   Temp 98.6 F (37 C) (Oral)   Resp 15   Ht  5' 7.01 (1.702 m)   Wt 227 lb (103 kg)   SpO2 96%   BMI 35.54 kg/m   Wt Readings from Last 3 Encounters:  02/13/24 227 lb (103 kg)  02/06/24 227 lb 6.4 oz (103.1 kg)  01/16/24  227 lb 6.4 oz (103.1 kg)    Physical Exam Vitals and nursing note reviewed.  Constitutional:      General: She is awake. She is not in acute distress.    Appearance: She is well-developed and well-groomed. She is obese. She is not ill-appearing or toxic-appearing.  HENT:     Head: Normocephalic.     Right Ear: Hearing and external ear normal.     Left Ear: Hearing and external ear normal.  Eyes:     General: Lids are normal.        Right eye: No discharge.        Left eye: No discharge.     Conjunctiva/sclera: Conjunctivae normal.     Pupils: Pupils are equal, round, and reactive to light.  Neck:     Thyroid : No thyromegaly.     Vascular: No carotid bruit.  Cardiovascular:     Rate and Rhythm: Normal rate and regular rhythm.     Heart sounds: Normal heart sounds. No murmur heard.    No gallop.  Pulmonary:     Effort: Pulmonary effort is normal. No accessory muscle usage or respiratory distress.     Breath sounds: Decreased air movement present. Wheezing present. No decreased breath sounds or rales.     Comments: Mild decrease in air movement present and scattered expiratory wheezes noted throughout per baseline.  Abdominal:     General: Bowel sounds are normal. There is no distension.     Palpations: Abdomen is soft.     Tenderness: There is no abdominal tenderness.  Musculoskeletal:     Cervical back: Normal range of motion and neck supple.     Right lower leg: Edema (trace) present.     Left lower leg: Edema (trace) present.  Lymphadenopathy:     Head:     Right side of head: No submental, submandibular, tonsillar, preauricular or posterior auricular adenopathy.     Left side of head: No submental, submandibular, tonsillar, preauricular or posterior auricular adenopathy.     Cervical: No cervical adenopathy.  Skin:    General: Skin is warm and dry.  Neurological:     Mental Status: She is alert and oriented to person, place, and time.     Cranial Nerves: Cranial nerves  2-12 are intact.     Motor: Weakness (BLE 3/5) and tremor (with activity noted most, upper body) present.     Coordination: Coordination is intact.     Gait: Gait is intact.     Deep Tendon Reflexes: Reflexes are normal and symmetric.     Reflex Scores:      Brachioradialis reflexes are 2+ on the right side and 2+ on the left side.      Patellar reflexes are 2+ on the right side and 2+ on the left side.    Comments: BUE strength 4/5. Can transfer from sitting to standing, but causes discomfort and slightly unsteady on feet. No rigidity or cogwheel. Oriented to person, place, and time. Some difficulty with short term recall noted.  Psychiatric:        Attention and Perception: Attention normal.        Mood and Affect: Mood normal.        Speech:  Speech normal.        Behavior: Behavior normal. Behavior is cooperative.        Thought Content: Thought content normal.    Results for orders placed or performed in visit on 02/13/24  Bayer DCA Hb A1c Waived   Collection Time: 02/13/24  9:12 AM  Result Value Ref Range   HB A1C (BAYER DCA - WAIVED) 6.9 (H) 4.8 - 5.6 %      Assessment & Plan:   Problem List Items Addressed This Visit       Cardiovascular and Mediastinum   Type 2 diabetes, controlled, with peripheral circulatory disorder (HCC) - Primary   Chronic, ongoing with A1c remains 6.9% today.  Urine ALB 80 January 2025.  Recommend she continue Olmesartan  daily for kidney protection. In past she did not tolerate Metformin  at higher doses and did not tolerate GLP1 or SGLT2.  Check BS BID, bring to visits.  Continue to collaborate with vascular.  - Continue Januvia  and Tresiba , recommend to stay at 20 units for now but if gets consistent lows is to cut back on dosage. Melinda and her agree with this and able to verbalize understanding. - Continue to work with [facility] Pharm[facility]. - ARB and statin on board. - Vaccinations up to date. - Foot and eye exam up to date.      Relevant  Medications   propranolol (INDERAL) 20 MG tablet   olmesartan  (BENICAR ) 5 MG tablet   verapamil  (CALAN ) 80 MG tablet   Other Relevant Orders   Bayer DCA Hb A1c Waived (Completed)   TSH   Hypertension associated with diabetes (HCC)   Chronic, stable.  BP well below goal on exam, may be able to reduce medication doses in future.  Continue Olmesartan  5 MG daily for kidney protection and Verapamil  80 MG daily + Lasix  for now.  Recommend she monitor BP at least a few mornings a week at home and document.  DASH diet at home.  Will check CBC, TSH, CMP today.        Relevant Medications   propranolol (INDERAL) 20 MG tablet   olmesartan  (BENICAR ) 5 MG tablet   verapamil  (CALAN ) 80 MG tablet   Other Relevant Orders   Bayer DCA Hb A1c Waived (Completed)   TSH   Chronic diastolic heart failure (HCC)   Chronic, stable.  Ongoing weight gain and edema present.  EF 05/02/23 was 60-65%.  Continue collaboration with cardiology, recent note reviewed - although she is refusing to return if no changes made by current cardiologist as was not satisfied with recent visit.  Continue current medication regimen. Advised her to reduce salt intake, including salami. - Reminded to call for an overnight weight gain of >2 pounds or a weekly weight gain of >5 pounds - not adding salt to food and read food labels. Reviewed the importance of keeping daily sodium intake to 2000mg  daily. - Avoid NSAIDs      Relevant Medications   propranolol (INDERAL) 20 MG tablet   olmesartan  (BENICAR ) 5 MG tablet   verapamil  (CALAN ) 80 MG tablet   Blue toe syndrome (HCC)   Chronic, ongoing.  Continue collaboration with vascular.  Recent notes reviewed.       Relevant Medications   propranolol (INDERAL) 20 MG tablet   olmesartan  (BENICAR ) 5 MG tablet   verapamil  (CALAN ) 80 MG tablet     Respiratory   Chronic respiratory failure with hypoxia and hypercapnia (HCC)   Continue to collaborate with pulmonary.  Tolerating O2, continue  use of this.      Centrilobular emphysema (HCC)   Chronic, progressive.  We have again discussed the progressive nature of emphysema, she not using inhaler daily as instructed and is not interested in quitting smoking. Discussed at length with her that progressive emphysema can lead to effects on other areas of the body, including heart health. - Will continue Bevespi  to allow for LABA/LAMA on board.  Discussed at length with her.  Continue Albuterol  as needed + nebulizer. - Sent in Nystatin  mouth rinse to use as needed for thrush which she reports with all inhalers - Recommend complete cessation of smoking.   - Continue annual CT scans.   - Continue collaboration with pulmonary, recent note reviewed. Recommend she schedule follow-up with them, but at this time she refuses. Her specialist copay increases next year and she is concerned about returning to specialists due to this, as lives on fixed income. - Continue to wear oxygen  consistently at 3L [place].  .          Endocrine   Hyperlipidemia associated with type 2 diabetes mellitus (HCC)   Chronic, ongoing.  Highly recommend she continue Crestor  and adjust dosing as needed or add on Zetia if poor control.  Lipid check today.      Relevant Medications   propranolol (INDERAL) 20 MG tablet   olmesartan  (BENICAR ) 5 MG tablet   verapamil  (CALAN ) 80 MG tablet   Other Relevant Orders   Bayer DCA Hb A1c Waived (Completed)   Comprehensive metabolic panel with GFR   Lipid Panel w/o Chol/HDL Ratio     Nervous and Auditory   Nicotine  dependence, cigarettes, w unsp disorders   I have recommended complete cessation of tobacco use. I have discussed various options available for assistance with tobacco cessation including over the counter methods (Nicotine  gum, patch and lozenges). We also discussed prescription options (Chantix , Nicotine  Inhaler / Nasal Spray). The patient is not interested in pursuing any prescription tobacco cessation options at this  time.  Continue yearly lung screening.         Other   Very poor mobility   Due to multiple chronic disease processes and pain, also uses portable O2. Mobility is affecting overall quality of life, including causing more depressed mood as cannot leave home often. Will work on obtaining a Jazzy Scooter for her to assist with mobility. Would be able to transfer from chair or bed to scooter without issue.       Tremor   Ongoing for several months, ?if more essential tremor based on exam. Obtaining MRI brain for memory changes. Will trial a low dose of Propranolol 20 MG daily, monitor HR with this. Currently in 90 range. Educated [place] and patient on this medication and to monitor HR and BP at home. Alert provider if HR consistently <60 or BP <100/50. Consider neurology referral in future if ongoing issues, refuses this today.      Relevant Orders   MR Brain W Wo Contrast   Obesity   BMI 35.54.  Recommended eating smaller high protein, low fat meals more frequently and exercising 30 mins a day 5 times a week with a goal of 10-15lb weight loss in the next 3 months. Patient voiced their understanding and motivation to adhere to these recommendations.       Memory changes   Over past several months per her friend. 6CIT 2 today, some short term recall difficulty noted. Will obtain labs today and imaging of  brain via MRI to further assess. Consider addition of memory care medication in future. Have recommended neurology, refuses visit with them at this time.      Relevant Orders   Vitamin B12   RPR   CBC with Differential/Platelet   MR Brain W Wo Contrast   Generalized anxiety disorder   Chronic, exacerbated due to overall health and mobility. Denies SI/HI.  Will continue Duloxetine  60 MG and  Buspar  5 MG BID which offers benefit anxiety and irritability.  Educated her on this medication and change + side effects and BLACK BOX warning.  Attempt to get [place] scooter which would overall improve  quality of life.      Relevant Medications   traZODone  (DESYREL ) 100 MG tablet      Follow up plan: Return in about 5 weeks (around 03/19/2024) for MEMORY CHANGES AND PAIN.

## 2024-02-13 NOTE — Assessment & Plan Note (Signed)
 Chronic, exacerbated due to overall health and mobility. Denies SI/HI.  Will continue Duloxetine  60 MG and  Buspar  5 MG BID which offers benefit anxiety and irritability.  Educated her on this medication and change + side effects and BLACK BOX warning.  Attempt to get [place] scooter which would overall improve quality of life.

## 2024-02-13 NOTE — Assessment & Plan Note (Signed)
 Due to multiple chronic disease processes and pain, also uses portable O2. Mobility is affecting overall quality of life, including causing more depressed mood as cannot leave home often. Will work on obtaining a Jazzy Scooter for her to assist with mobility. Would be able to transfer from chair or bed to scooter without issue.

## 2024-02-13 NOTE — Assessment & Plan Note (Signed)
 Chronic, progressive.  We have again discussed the progressive nature of emphysema, she not using inhaler daily as instructed and is not interested in quitting smoking. Discussed at length with her that progressive emphysema can lead to effects on other areas of the body, including heart health. - Will continue Bevespi  to allow for LABA/LAMA on board.  Discussed at length with her.  Continue Albuterol  as needed + nebulizer. - Sent in Nystatin  mouth rinse to use as needed for thrush which she reports with all inhalers - Recommend complete cessation of smoking.   - Continue annual CT scans.   - Continue collaboration with pulmonary, recent note reviewed. Recommend she schedule follow-up with them, but at this time she refuses. Her specialist copay increases next year and she is concerned about returning to specialists due to this, as lives on fixed income. - Continue to wear oxygen  consistently at 3L [place].  SABRA

## 2024-02-13 NOTE — Assessment & Plan Note (Signed)
 Chronic, stable.  BP well below goal on exam, may be able to reduce medication doses in future.  Continue Olmesartan  5 MG daily for kidney protection and Verapamil  80 MG daily + Lasix  for now.  Recommend she monitor BP at least a few mornings a week at home and document.  DASH diet at home.  Will check CBC, TSH, CMP today.

## 2024-02-13 NOTE — Assessment & Plan Note (Signed)
 Chronic, ongoing.  Highly recommend she continue Crestor  and adjust dosing as needed or add on Zetia if poor control.  Lipid check today.

## 2024-02-13 NOTE — Assessment & Plan Note (Signed)
 Ongoing for several months, ?if more essential tremor based on exam. Obtaining MRI brain for memory changes. Will trial a low dose of Propranolol 20 MG daily, monitor HR with this. Currently in 90 range. Educated [place] and patient on this medication and to monitor HR and BP at home. Alert provider if HR consistently <60 or BP <100/50. Consider neurology referral in future if ongoing issues, refuses this today.

## 2024-02-13 NOTE — Assessment & Plan Note (Signed)
 Continue to collaborate with pulmonary.  Tolerating O2, continue use of this.

## 2024-02-13 NOTE — Assessment & Plan Note (Signed)
 I have recommended complete cessation of tobacco use. I have discussed various options available for assistance with tobacco cessation including over the counter methods (Nicotine gum, patch and lozenges). We also discussed prescription options (Chantix, Nicotine Inhaler / Nasal Spray). The patient is not interested in pursuing any prescription tobacco cessation options at this time.  Continue yearly lung screening.

## 2024-02-13 NOTE — Assessment & Plan Note (Signed)
 Over past several months per her friend. 6CIT 2 today, some short term recall difficulty noted. Will obtain labs today and imaging of brain via MRI to further assess. Consider addition of memory care medication in future. Have recommended neurology, refuses visit with them at this time.

## 2024-02-13 NOTE — Assessment & Plan Note (Signed)
BMI 35.54. Recommended eating smaller high protein, low fat meals more frequently and exercising 30 mins a day 5 times a week with a goal of 10-15lb weight loss in the next 3 months. Patient voiced their understanding and motivation to adhere to these recommendations.

## 2024-02-13 NOTE — Assessment & Plan Note (Signed)
 Chronic, stable.  Ongoing weight gain and edema present.  EF 05/02/23 was 60-65%.  Continue collaboration with cardiology, recent note reviewed - although she is refusing to return if no changes made by current cardiologist as was not satisfied with recent visit.  Continue current medication regimen. Advised her to reduce salt intake, including salami. - Reminded to call for an overnight weight gain of >2 pounds or a weekly weight gain of >5 pounds - not adding salt to food and read food labels. Reviewed the importance of keeping daily sodium intake to 2000mg  daily. - Avoid NSAIDs

## 2024-02-13 NOTE — Assessment & Plan Note (Signed)
 Chronic, ongoing.  Continue collaboration with vascular.  Recent notes reviewed.

## 2024-02-13 NOTE — Assessment & Plan Note (Signed)
 Chronic, ongoing with A1c remains 6.9% today.  Urine ALB 80 January 2025.  Recommend she continue Olmesartan  daily for kidney protection. In past she did not tolerate Metformin  at higher doses and did not tolerate GLP1 or SGLT2.  Check BS BID, bring to visits.  Continue to collaborate with vascular.  - Continue Januvia  and Tresiba , recommend to stay at 20 units for now but if gets consistent lows is to cut back on dosage. Stacey Gross and her agree with this and able to verbalize understanding. - Continue to work with [facility] PharmD. - ARB and statin on board. - Vaccinations up to date. - Foot and eye exam up to date.

## 2024-02-14 ENCOUNTER — Ambulatory Visit: Payer: Self-pay | Admitting: Nurse Practitioner

## 2024-02-14 LAB — TSH: TSH: 0.828 u[IU]/mL (ref 0.450–4.500)

## 2024-02-14 LAB — VITAMIN B12: Vitamin B-12: 1462 pg/mL — ABNORMAL HIGH (ref 232–1245)

## 2024-02-14 LAB — COMPREHENSIVE METABOLIC PANEL WITH GFR
ALT: 8 IU/L (ref 0–32)
AST: 13 IU/L (ref 0–40)
Albumin: 4.4 g/dL (ref 3.9–4.9)
Alkaline Phosphatase: 100 IU/L (ref 49–135)
BUN/Creatinine Ratio: 21 (ref 12–28)
BUN: 23 mg/dL (ref 8–27)
Bilirubin Total: 0.3 mg/dL (ref 0.0–1.2)
CO2: 33 mmol/L — ABNORMAL HIGH (ref 20–29)
Calcium: 9.8 mg/dL (ref 8.7–10.3)
Chloride: 91 mmol/L — ABNORMAL LOW (ref 96–106)
Creatinine, Ser: 1.1 mg/dL — ABNORMAL HIGH (ref 0.57–1.00)
Globulin, Total: 2.1 g/dL (ref 1.5–4.5)
Glucose: 118 mg/dL — ABNORMAL HIGH (ref 70–99)
Potassium: 4.8 mmol/L (ref 3.5–5.2)
Sodium: 139 mmol/L (ref 134–144)
Total Protein: 6.5 g/dL (ref 6.0–8.5)
eGFR: 56 mL/min/1.73 — ABNORMAL LOW (ref >59)

## 2024-02-14 LAB — CBC WITH DIFFERENTIAL/PLATELET
Basophils Absolute: 0.1 x10E3/uL (ref 0.0–0.2)
Basos: 1 %
EOS (ABSOLUTE): 0.1 x10E3/uL (ref 0.0–0.4)
Eos: 1 %
Hematocrit: 45.1 % (ref 34.0–46.6)
Hemoglobin: 13.9 g/dL (ref 11.1–15.9)
Immature Grans (Abs): 0.1 x10E3/uL (ref 0.0–0.1)
Immature Granulocytes: 1 %
Lymphocytes Absolute: 1.7 x10E3/uL (ref 0.7–3.1)
Lymphs: 15 %
MCH: 28.5 pg (ref 26.6–33.0)
MCHC: 30.8 g/dL — ABNORMAL LOW (ref 31.5–35.7)
MCV: 92 fL (ref 79–97)
Monocytes Absolute: 0.6 x10E3/uL (ref 0.1–0.9)
Monocytes: 6 %
Neutrophils Absolute: 8.2 x10E3/uL — ABNORMAL HIGH (ref 1.4–7.0)
Neutrophils: 76 %
Platelets: 259 x10E3/uL (ref 150–450)
RBC: 4.88 x10E6/uL (ref 3.77–5.28)
RDW: 12.5 % (ref 11.7–15.4)
WBC: 10.7 x10E3/uL (ref 3.4–10.8)

## 2024-02-14 LAB — LIPID PANEL W/O CHOL/HDL RATIO
Cholesterol, Total: 141 mg/dL (ref 100–199)
HDL: 44 mg/dL (ref >39)
LDL Chol Calc (NIH): 61 mg/dL (ref 0–99)
Triglycerides: 222 mg/dL — ABNORMAL HIGH (ref 0–149)
VLDL Cholesterol Cal: 36 mg/dL (ref 5–40)

## 2024-02-14 LAB — RPR: RPR Ser Ql: NONREACTIVE

## 2024-02-14 NOTE — Progress Notes (Signed)
 Good afternoon, please let Stacey Gross know her labs have returned: - A1c remains stable at 6.9%, no medication changes for this. - Kidney function continues to show Stage 3a kidney disease with no worsening, we will continue to monitor this closely. - CO2 level remains a little elevated, I would recommend if continue increased fatigue and SOB return to pulmonary immediately for further recommendations. Also cut back on smoking.  - Remainder of labs stable overall. Any questions? Keep being amazing!!  Thank you for allowing me to participate in your care.  I appreciate you. Kindest regards, Nairi Oswald

## 2024-02-19 ENCOUNTER — Other Ambulatory Visit: Payer: Self-pay

## 2024-02-19 DIAGNOSIS — I5032 Chronic diastolic (congestive) heart failure: Secondary | ICD-10-CM

## 2024-02-21 ENCOUNTER — Ambulatory Visit: Admission: RE | Admit: 2024-02-21 | Source: Ambulatory Visit

## 2024-02-27 ENCOUNTER — Ambulatory Visit: Admitting: Podiatry

## 2024-03-04 ENCOUNTER — Telehealth: Admitting: Nurse Practitioner

## 2024-03-04 ENCOUNTER — Encounter: Payer: Self-pay | Admitting: Nurse Practitioner

## 2024-03-04 ENCOUNTER — Ambulatory Visit: Payer: Self-pay

## 2024-03-04 VITALS — BP 111/69 | Ht 67.01 in | Wt 227.0 lb

## 2024-03-04 DIAGNOSIS — J441 Chronic obstructive pulmonary disease with (acute) exacerbation: Secondary | ICD-10-CM | POA: Diagnosis not present

## 2024-03-04 MED ORDER — PREDNISONE 20 MG PO TABS: 40.0000 mg | ORAL_TABLET | Freq: Every day | ORAL | 0 refills | Status: AC

## 2024-03-04 MED ORDER — MICONAZOLE NITRATE 2 % VA CREA: 1.0000 | TOPICAL_CREAM | Freq: Every day | VAGINAL | 0 refills | Status: AC

## 2024-03-04 MED ORDER — CEFDINIR 300 MG PO CAPS: 300.0000 mg | ORAL_CAPSULE | Freq: Two times a day (BID) | ORAL | 0 refills | Status: AC

## 2024-03-04 NOTE — Telephone Encounter (Signed)
 Scheduled virtual and advised to get a test from drug store

## 2024-03-04 NOTE — Assessment & Plan Note (Signed)
 Acute with symptoms worsening over past 2 days.  Continue 3L oxygen  at home and inhaler regimen as ordered.  Start Omnicef  BID for 7 days and Prednisone  40 MG daily for 5 days. Continue Tussionex PRN, which she has some leftover at home.  She is aware if any worsening she is immediately to go to ER, strict precautions given.  Highly recommend she return for visit with pulmonary, she has not as of yet scheduled.  - Increased rest - Increasing Fluids - Acetaminophen  as needed for fever/pain.  - Mucinex .  - Humidifying the air.  - Miconazole cream sent in for yeast if presents with abx therapy. Since is acutely ill will avoid Diflucan  orally due to risk for interaction and QT prolongation with Albuterol .

## 2024-03-04 NOTE — Patient Instructions (Signed)
 COPD Exacerbation Learn about triggers that can make COPD worse and how to avoid them. To view the content, go to this web address: [email or web address]  This video will expire on: 08/28/2025. If you need access to this video following this date, please reach out to the healthcare provider who assigned it to you. This information is not intended to replace advice given to you by your health care provider. Make sure you discuss any questions you have with your health care provider. Elsevier Patient Education  [employer].

## 2024-03-04 NOTE — Telephone Encounter (Signed)
 FYI Only or Action Required?: Action required by provider: update on patient condition.  Patient was last seen in primary care on 02/13/2024 by Valerio Melanie DASEN, NP.  Called Nurse Triage reporting Cough.  Symptoms began several days ago.  Interventions attempted: Prescription medications: albuterol  neb, bevespi .  Symptoms are: gradually worsening.  Triage Disposition: Go to ED Now (Notify PCP)  Patient/caregiver understands and will follow disposition?: No, refuses disposition        Copied from CRM #[id number]. Topic: Clinical - Red Word Triage >> Mar 04, 2024  7:40 AM Charlet HERO wrote: Red Word that prompted transfer to Nurse Triage: Patient sister Newell calling bc she has a lot of phlem Yellowish brown and coughing. Cannady Reason for Disposition  Oxygen  level (e.g., pulse oximetry) 90% or lower    Pt sister called on pt behalf with pt in the background. Pt has been experiencing hypoxia with productive cough since Saturday. Pt baseline O2 is 3L, but now at 4L with SpO2 89-92. Pt has taken albuterol  NEB this AM with minimal relief. Triager strongly advised ED for further evaluation/treatment. Pt refused and sister stated that pt is DNR and to ask PCP for abx.  Triager will forward encounter for Jolene, NP 's office to review and advise. Caregiver verbalized understanding and is expecting call back from office for next steps. Triager also advised that if pt does not hear back from office, to follow disposition for further evaluation/treatment.  Triager called PCP CAL and spoke to Iris-- She was given pt name and update on pt request.  Answer Assessment - Initial Assessment Questions 1. MAIN CONCERN OR SYMPTOM: What's your main concern? (e.g., low oxygen  level, breathing difficulty) What question do you have?     Productive cough, low SpO2 2. ONSET: When did the  sx  start?      saturday 3. OXYGEN  THERAPY:      3L at baseline OTC, increase to 4L 4.  OXYGEN  EQUIPMENT:  Are  you having any trouble with your oxygen  equipment?  (e.g., cannula, mask, tubing, tank, concentrator)     denies 5. OXYGEN  SATURATION MONITOR:       N/a 6. OXYGEN  LEVEL: What is your reading (oxygen  level) today? What is your usual oxygen  saturation reading? (e.g., 95%)     89-92 on 4L O2 7. VSS MONITORING: Do you monitor/measure your oxygen  level or vital signs? (e.g., yes, no, measurements are automatically sent to provider/call center). Document CURRENT and NORMAL BASELINE values if available.       Typically 94-96 on 3L Last albuterol  neb 0630 with minimal relief. Triager reviewed/reinforced albuterol  usage and SIG.  8. BREATHING DIFFICULTY: Are you having any difficulty breathing? If Yes, ask: How bad is it?  (e.g., none, mild, moderate, severe)      Denies Patient can be heard in the background. Triager does not appreciate audible SOB/wheezing during call. Pt is speaking in full sentences.  9. OTHER SYMPTOMS: Do you have any other symptoms? (e.g., fever, change in sputum)     Afebrile, yellow productive cough. 10. SMOKING: Do you smoke currently? Is there anyone that smokes around you?  (Note: smoking around oxygen  is dangerous!)  Answer Assessment - Initial Assessment Questions 1. ONSET: When did the cough begin?      Saturday 2. SEVERITY: How bad is the cough today?      *No Answer* 3. SPUTUM: Describe the color of your sputum (e.g., none, dry cough; clear, white, yellow, green)     *No Answer*  4. HEMOPTYSIS: Are you coughing up any blood? If Yes, ask: How much? (e.g., flecks, streaks, tablespoons, etc.)     *No Answer* 5. DIFFICULTY BREATHING: Are you having difficulty breathing? If Yes, ask: How bad is it? (e.g., mild, moderate, severe)      *No Answer*  6. FEVER: Do you have a fever? If Yes, ask: What is your temperature, how was it measured, and when did it start?     *No Answer* 7. CARDIAC HISTORY: Do you have any history of heart  disease? (e.g., heart attack, congestive heart failure)      *No Answer* 8. LUNG HISTORY: Do you have any history of lung disease?  (e.g., pulmonary embolus, asthma, emphysema)     *No Answer* 9. PE RISK FACTORS: Do you have a history of blood clots? (or: recent major surgery, recent prolonged travel, bedridden)     *No Answer* 10. OTHER SYMPTOMS: Do you have any other symptoms? (e.g., runny nose, wheezing, chest pain)       *No Answer* Typically on 3L. Now at 4L 11. PREGNANCY: Is there any chance you are pregnant? When was your last menstrual period?       *No Answer* 12. TRAVEL: Have you traveled out of the country in the last month? (e.g., travel history, exposures)       *No Answer*  Protocols used: Cough - Acute Productive-A-AH, Oxygen  Monitoring and Hypoxia-A-AH

## 2024-03-04 NOTE — Progress Notes (Signed)
 BP 111/69 (BP Location: Left Arm, Patient Position: Sitting, Cuff Size: Large)   Ht 5' 7.01 (1.702 m)   Wt 227 lb (103 kg)   BMI 35.54 kg/m    Subjective:    Patient ID: Stacey Gross, female    DOB: December 23, 1960, 63 y.o.   MRN: [id number]  HPI: Stacey Gross is a 63 y.o. female  Chief Complaint  Patient presents with   Cough    Progressive cough and phlegm, started Saturday. Color is yellow/brown. Coughing and headache. Negative home tests for Covid and Flu.    Virtual Visit via Video Note  I connected with Stacey Gross on 03/04/24 at  2:00 PM EST by a video enabled telemedicine application and verified that I am speaking with the correct person using two identifiers.  Location: Patient: home Provider: work   I discussed the limitations of evaluation and management by telemedicine and the availability of in person appointments. The patient expressed understanding and agreed to proceed.  I discussed the assessment and treatment plan with the patient. The patient was provided an opportunity to ask questions and all were answered. The patient agreed with the plan and demonstrated an understanding of the instructions.   The patient was advised to call back or seek an in-person evaluation if the symptoms worsen or if the condition fails to improve as anticipated.  I provided 25 minutes of non-face-to-face time during this encounter.   [name], NP   COPD Started with symptoms on Saturday. No fever. Home Covid testing was negative. Around no one that was sick. Feels the weather aggravated. Coughing up very thick phlegm and wheezing. Last treated for exacerbation on 12/06/23 and was treated with Omnicef  which she had benefit from. Last saw pulmonary on 08/17/23, have recommended she follow-up with them but she has not. COPD status: exacerbated Satisfied with current treatment?: yes Oxygen  use: yes Dyspnea frequency: sometimes Cough frequency: yes Rescue inhaler  frequency:  more often when sick Limitation of activity: no Productive cough: yes Last Spirometry: with pulmonary Pneumovax: Up to Date Influenza: Up to Date   Relevant past medical, surgical, family and social history reviewed and updated as indicated. Interim medical history since our last visit reviewed. Allergies and medications reviewed and updated.  Review of Systems  Constitutional:  Positive for activity change and fatigue. Negative for appetite change, chills and fever.  HENT:  Positive for ear pain (left), postnasal drip, rhinorrhea, sinus pressure and sore throat. Negative for congestion, ear discharge, sinus pain and sneezing.   Respiratory:  Positive for cough, chest tightness, shortness of breath and wheezing.   Cardiovascular:  Negative for chest pain, palpitations and leg swelling.  Gastrointestinal: Negative.   Neurological:  Positive for headaches.  Psychiatric/Behavioral: Negative.      Per HPI unless specifically indicated above     Objective:    BP 111/69 (BP Location: Left Arm, Patient Position: Sitting, Cuff Size: Large)   Ht 5' 7.01 (1.702 m)   Wt 227 lb (103 kg)   BMI 35.54 kg/m   Wt Readings from Last 3 Encounters:  03/04/24 227 lb (103 kg)  02/13/24 227 lb (103 kg)  02/06/24 227 lb 6.4 oz (103.1 kg)    Physical Exam Vitals and nursing note reviewed.  Constitutional:      General: She is awake. She is not in acute distress.    Appearance: She is well-developed and well-groomed. She is obese. She is ill-appearing. She is not toxic-appearing.  HENT:  Head: Normocephalic.     Right Ear: Hearing normal.     Left Ear: Hearing normal.  Eyes:     General: Lids are normal.        Right eye: No discharge.        Left eye: No discharge.     Conjunctiva/sclera: Conjunctivae normal.  Pulmonary:     Effort: Pulmonary effort is normal. No accessory muscle usage or respiratory distress.     Comments: No SOB with talking. Intermittent cough  noted. Musculoskeletal:     Cervical back: Normal range of motion.  Neurological:     Mental Status: She is alert and oriented to person, place, and time.  Psychiatric:        Attention and Perception: Attention normal.        Mood and Affect: Mood normal.        Behavior: Behavior normal. Behavior is cooperative.        Thought Content: Thought content normal.        Judgment: Judgment normal.    Results for orders placed or performed in visit on 02/13/24  Bayer DCA Hb A1c Waived   Collection Time: 02/13/24  9:12 AM  Result Value Ref Range   HB A1C (BAYER DCA - WAIVED) 6.9 (H) 4.8 - 5.6 %  Comprehensive metabolic panel with GFR   Collection Time: 02/13/24  9:14 AM  Result Value Ref Range   Glucose 118 (H) 70 - 99 mg/dL   BUN 23 8 - 27 mg/dL   Creatinine, Ser 8.89 (H) 0.57 - 1.00 mg/dL   eGFR 56 (L) >40 fO/fpw/8.26   BUN/Creatinine Ratio 21 12 - 28   Sodium 139 134 - 144 mmol/L   Potassium 4.8 3.5 - 5.2 mmol/L   Chloride 91 (L) 96 - 106 mmol/L   CO2 33 (H) 20 - 29 mmol/L   Calcium  9.8 8.7 - 10.3 mg/dL   Total Protein 6.5 6.0 - 8.5 g/dL   Albumin 4.4 3.9 - 4.9 g/dL   Globulin, Total 2.1 1.5 - 4.5 g/dL   Bilirubin Total 0.3 0.0 - 1.2 mg/dL   Alkaline Phosphatase 100 49 - 135 IU/L   AST 13 0 - 40 IU/L   ALT 8 0 - 32 IU/L  Lipid Panel w/o Chol/HDL Ratio   Collection Time: 02/13/24  9:14 AM  Result Value Ref Range   Cholesterol, Total 141 100 - 199 mg/dL   Triglycerides 777 (H) 0 - 149 mg/dL   HDL 44 >60 mg/dL   VLDL Cholesterol Cal 36 5 - 40 mg/dL   LDL Chol Calc (NIH) 61 0 - 99 mg/dL  Vitamin A87   Collection Time: 02/13/24  9:14 AM  Result Value Ref Range   Vitamin B-12 1,462 (H) 232 - 1,245 pg/mL  RPR   Collection Time: 02/13/24  9:14 AM  Result Value Ref Range   RPR Ser Ql Non Reactive Non Reactive  CBC with Differential/Platelet   Collection Time: 02/13/24  9:14 AM  Result Value Ref Range   WBC 10.7 3.4 - 10.8 x10E3/uL   RBC 4.88 3.77 - 5.28 x10E6/uL    Hemoglobin 13.9 11.1 - 15.9 g/dL   Hematocrit 54.8 65.9 - 46.6 %   MCV 92 79 - 97 fL   MCH 28.5 26.6 - 33.0 pg   MCHC 30.8 (L) 31.5 - 35.7 g/dL   RDW 87.4 88.2 - 84.5 %   Platelets 259 150 - 450 x10E3/uL   Neutrophils 76 Not Estab. %  Lymphs 15 Not Estab. %   Monocytes 6 Not Estab. %   Eos 1 Not Estab. %   Basos 1 Not Estab. %   Neutrophils Absolute 8.2 (H) 1.4 - 7.0 x10E3/uL   Lymphocytes Absolute 1.7 0.7 - 3.1 x10E3/uL   Monocytes Absolute 0.6 0.1 - 0.9 x10E3/uL   EOS (ABSOLUTE) 0.1 0.0 - 0.4 x10E3/uL   Basophils Absolute 0.1 0.0 - 0.2 x10E3/uL   Immature Granulocytes 1 Not Estab. %   Immature Grans (Abs) 0.1 0.0 - 0.1 x10E3/uL  TSH   Collection Time: 02/13/24  9:14 AM  Result Value Ref Range   TSH 0.828 0.450 - 4.500 uIU/mL      Assessment & Plan:   Problem List Items Addressed This Visit       Respiratory   COPD exacerbation (HCC) - Primary   Acute with symptoms worsening over past 2 days.  Continue 3L oxygen  at home and inhaler regimen as ordered.  Start Omnicef  BID for 7 days and Prednisone  40 MG daily for 5 days. Continue Tussionex PRN, which she has some leftover at home.  She is aware if any worsening she is immediately to go to ER, strict precautions given.  Highly recommend she return for visit with pulmonary, she has not as of yet scheduled.  - Increased rest - Increasing Fluids - Acetaminophen  as needed for fever/pain.  - Mucinex .  - Humidifying the air.  - Miconazole cream sent in for yeast if presents with abx therapy. Since is acutely ill will avoid Diflucan  orally due to risk for interaction and QT prolongation with Albuterol .      Relevant Medications   predniSONE  (DELTASONE ) 20 MG tablet     Follow up plan: Return in about 16 days (around 03/20/2024) for as scheduled on December 17th for follow-up.

## 2024-03-16 NOTE — Progress Notes (Unsigned)
° °  03/18/24  Patient ID: Stacey Gross, female   DOB: [birth year], 63 y.o.   MRN: [id number]  Subjective/Objective: Telephone visit to follow-up on management of chronic conditions  Diabetes -Current medications:  Januvia  100mg  daily, Tresiba  20 units at bedtime -Did not tolerate metformin  (significant diarrhea), GLP1's (significant GI upset), or Farxiga  (yeast infections) in the past -Using Libre3 for CGM -Patient does monitor home FBG and endorses values averaging 90-110 -Does endorse 1 instance of hypoglycemia where her sensor alerted her that BG was below 70, so she had some orange juice, and this resoled -Endorses affordability of Tresiba  and all medications, stating she does not have copays for her prescriptions  Lab Results  Component Value Date   HGBA1C 6.9 (H) 02/13/2024   HGBA1C 6.9 (H) 11/17/2023   HGBA1C 8.6 (H) 08/03/2023    Hypertension -Current medications:  furosemide  40mg  daily, olmesartan  5mg  daily, verapamil  80mg  daily, propranolol  20mg  daily  -Propranolol  recently added for tremor versus BP/HR control -Patient does monitor home BP but uses automated wrist monitor, because she states upper arm cuffs bruise her arm -She endorses home BP is usually 170/70; however, last office visit BP on 12/1 was 111/69- likely that readings on home wrist monitor are not accurate  ASCVD -Current medications:  rosuvastatin  40mg  daily/ -Followed by cardiology      Component Value Date/Time   CHOL 141 02/13/2024 0914   CHOL 194 02/22/2017 0847   TRIG 222 (H) 02/13/2024 0914   TRIG 227 (H) 02/22/2017 0847   HDL 44 02/13/2024 0914   CHOLHDL 4.1 05/13/2020 0934   VLDL 45 (H) 02/22/2017 0847   LDLCALC 61 02/13/2024 0914   LABVLDL 36 02/13/2024 0914    COPD -Current medications:  albuterol  HFA as needed, Bevespi  2 puffs daily, albuterol  nebulizer solution as needed -Recent exacerbation 12/1 -Patient endorses improvements in symptoms since completing abx and steroid -Good  adherence to Bevespi  per patient  -Did not tolerate Breztri  (thrush) in the past -On 3L of oxygen  at home   Assessment/Plan   Diabetes -Controlled based on A1c -Continue current regimen at this time -Continue CGM with Libre 3 -Will be due for A1c, UACR, and CMP in February 2026  Hypertension -Controlled -Continue current regimen at this time -I recommend checking home BP and HR  daily 30 minutes to 1 hour after taking morning BP medications, especially with recent addition of propranolol  and somewhat low office BP readings lately -Advised patient after our last visit to use OTC benefit from insurance to purchase automated, upper arm monitor with L/XL cuff -Continue regular follow-up with cardiology and PCP  ASCVD -LDL at goal of <70 -Continue regular follow-up with PCP and cardiology  COPD -Continue current regimen at this time -Do not miss doses of Bevespi  -Always make sure you have albuterol  rescue inhaler and nebulizer solution on hand to use as needed -Continue to follow-up with pulmonology and PCP -I recommend close monitoring with recently added propranolol  for tremor, as this can cause bronchoconstriction in patients with COPD   Follow-up:  1 month   Stacey Gross, PharmD, DPLA

## 2024-03-17 NOTE — Patient Instructions (Signed)
 Chronic Back Pain Chronic back pain is back pain that lasts longer than 3 months. The pain may get worse at certain times (flare-ups). There are things you can do at home to manage your pain. Follow these instructions at home: Watch for any changes in your symptoms. Take these actions to help with your pain: Managing pain and stiffness     If told, put ice on the painful area. You may be told to use ice for 24-48 hours after a flare-up starts. Put ice in a plastic bag. Place a towel between your skin and the bag. Leave the ice on for 20 minutes, 2-3 times a day. If told, put heat on the painful area. Do this as often as told by your doctor. Use the heat source that your doctor recommends, such as a moist heat pack or a heating pad. Place a towel between your skin and the heat source. Leave the heat on for 20-30 minutes. If your skin turns bright red, take off the ice or heat right away to prevent skin damage. The risk of damage is higher if you cannot feel pain, heat, or cold. Soak in a warm bath. This can help with pain. Activity        Avoid bending and other activities that make the pain worse. When you stand: Keep your upper back and neck straight. Keep your shoulders pulled back. Avoid slouching. When you sit: Keep your back straight. Relax your shoulders. Do not round your shoulders or pull them backward. Do not sit or stand in one place for too long. Take short rest breaks during the day. Lying down or standing is often better than sitting. Resting can help relieve pain. When sitting or lying down for a long time, do some mild activity or stretching. This will help to prevent stiffness and pain. Get regular exercise. Ask your doctor what activities are safe for you. You may have to avoid lifting. Ask your provider how much you can safely lift. If you lift things: Bend your knees. Keep the weight close to your body. Avoid twisting. Medicines Take over-the-counter and  prescription medicines only as told by your doctor. You may need to take medicines for pain and swelling. These may be taken by mouth or put on the skin. You may also be given muscle relaxants. Ask your doctor if the medicine prescribed to you: Requires you to avoid driving or using machinery. Can cause trouble pooping (constipation). You may need to take these actions to prevent or treat trouble pooping: Drink enough fluid to keep your pee (urine) pale yellow. Take over-the-counter or prescription medicines. Eat foods that are high in fiber. These include beans, whole grains, and fresh fruits and vegetables. Limit foods that are high in fat and sugars. These include fried or sweet foods. General instructions  Sleep on a firm mattress. Try lying on your side with your knees slightly bent. If you lie on your back, put a pillow under your knees. Do not smoke or use any products that contain nicotine or tobacco. If you need help quitting, ask your doctor. Contact a doctor if: Your pain does not get better with rest or medicine. You have new pain. You have a fever. You lose weight quickly. You have trouble doing your normal activities. One or both of your legs or feet feel weak. One or both of your legs or feet lose feeling (have numbness). Get help right away if: You are not able to control when you pee  or poop. You have bad back pain and: You feel like you may vomit (nauseous). You vomit. You have pain in your chest or your belly (abdomen). You have shortness of breath. You faint. These symptoms may be an emergency. Get help right away. Call 911. Do not wait to see if the symptoms will go away. Do not drive yourself to the hospital. This information is not intended to replace advice given to you by your health care provider. Make sure you discuss any questions you have with your health care provider. Document Revised: 11/08/2021 Document Reviewed: 11/08/2021 Elsevier Patient Education   2024 [employer].

## 2024-03-18 ENCOUNTER — Other Ambulatory Visit: Payer: Self-pay

## 2024-03-18 DIAGNOSIS — J441 Chronic obstructive pulmonary disease with (acute) exacerbation: Secondary | ICD-10-CM

## 2024-03-18 DIAGNOSIS — E1169 Type 2 diabetes mellitus with other specified complication: Secondary | ICD-10-CM

## 2024-03-18 DIAGNOSIS — E1159 Type 2 diabetes mellitus with other circulatory complications: Secondary | ICD-10-CM

## 2024-03-18 DIAGNOSIS — I152 Hypertension secondary to endocrine disorders: Secondary | ICD-10-CM

## 2024-03-18 DIAGNOSIS — E1151 Type 2 diabetes mellitus with diabetic peripheral angiopathy without gangrene: Secondary | ICD-10-CM

## 2024-03-20 ENCOUNTER — Ambulatory Visit: Admitting: Nurse Practitioner

## 2024-03-20 ENCOUNTER — Encounter: Payer: Self-pay | Admitting: Nurse Practitioner

## 2024-03-20 DIAGNOSIS — R413 Other amnesia: Secondary | ICD-10-CM | POA: Diagnosis not present

## 2024-03-20 DIAGNOSIS — G8929 Other chronic pain: Secondary | ICD-10-CM

## 2024-03-20 DIAGNOSIS — M545 Low back pain, unspecified: Secondary | ICD-10-CM

## 2024-03-20 DIAGNOSIS — R251 Tremor, unspecified: Secondary | ICD-10-CM

## 2024-03-20 MED ORDER — PREGABALIN 100 MG PO CAPS: 100.0000 mg | ORAL_CAPSULE | Freq: Two times a day (BID) | ORAL | 2 refills | Status: DC

## 2024-03-20 NOTE — Assessment & Plan Note (Signed)
 Over past several months per her friend. 6CIT 2 last visit, some short term recall difficulty noted. Labs overal reassuring recent check. She refuses MRI due to cost OOP. Consider addition of memory care medication in future. Have recommended neurology, refuses visit with them at this time.

## 2024-03-20 NOTE — Progress Notes (Addendum)
 BP 108/72 (BP Location: Left Wrist, Patient Position: Sitting)   Pulse 69   Temp 97.8 F (36.6 C) (Oral)   Resp 17   Ht 5' 7.01 (1.702 m)   Wt 227 lb (103 kg)   SpO2 95%   PF (!) 3 L/min   BMI 35.54 kg/m    Subjective:    Patient ID: Stacey Gross Spanner, female    DOB: 12-Jun-1960, 63 y.o.   MRN: [id number]  HPI: Stacey Gross is a 63 y.o. female  Chief Complaint  Patient presents with   Memory changes    Can't remember from one day to the next.    Pain    Pain all the time, nothing in particular it all hurts. Left sided hip pain she has never had before. No injury.    She will continue to see PCP, podiatry, and pulmonary, but will see no one else from now on. Has not heard from cardiology about Zio results. Is frustrated.  MEMORY CHANGES Report initially at visit 02/13/24. Her friend Newell, who attends all her visits, had concerns for this. Overall labs reassuring recently. Was to have MRI brain but did not show for this - it was going to cost $200. Newell also reports she has a tremor which has worsened. Started Propranolol  20 MG at visit 02/13/24, they have not noticed much difference with this. Currently smoking <1 PPD. Recurrent headaches: no Visual changes: no Palpitations: no Dyspnea: at baseline, improved after recent illness Chest pain: no Lower extremity edema: off and on Dizzy/lightheaded: at baseline  CHRONIC PAIN  Taking Lyrica  75 MG BID. Recently her hip started hurting her. Has history of multiple back surgeries. Has not heard about scooter which was ordered in November. Does not want PT in home or to attend. Newell comes and makes sure she gets up daily. Pain control status: stable Duration: chronic Location: back pain, hip pain, leg pain Quality: dull, aching, and throbbing Current Pain Level: 9/10 Previous Pain Level: 7/10 Breakthrough pain: no Benefit from narcotic medications: none taken What Activities task can be accomplished with current  medication? Minimal ADLs Interested in weaning off narcotics:no   Stool softners/OTC fiber: no  Previous pain specialty evaluation: no Non-narcotic analgesic meds: yes Narcotic contract: not applicable      03/20/2024   10:27 AM 02/13/2024    1:37 PM 06/27/2023   11:26 AM 05/04/2023   10:01 AM 02/01/2023    9:53 AM  Depression screen PHQ 2/9  Decreased Interest 3 3 3  0 3  Down, Depressed, Hopeless 1 3 1  0 3  PHQ - 2 Score 4 6 4  0 6  Altered sleeping 1 1 2  0 3  Tired, decreased energy 3 3 3  0 3  Change in appetite 3  3 0 3  Feeling bad or failure about yourself  0 0 0 0 0  Trouble concentrating 0 0 0 0 0  Moving slowly or fidgety/restless 1 1 0 0 1  Suicidal thoughts 0 0 0 0 0  PHQ-9 Score 12 11 12   0  16   Difficult doing work/chores  Somewhat difficult Very difficult Not difficult at all Somewhat difficult     Data saved with a previous flowsheet row definition       03/20/2024   10:28 AM 02/13/2024    1:37 PM 05/04/2023   10:01 AM 02/01/2023    9:54 AM  GAD 7 : Generalized Anxiety Score  Nervous, Anxious, on Edge 1 1 0 1  Control/stop worrying 1 1 0 1  Worry too much - different things 1 1 0 1  Trouble relaxing 0 0 0 0  Restless 0 0 0 0  Easily annoyed or irritable 1 1 0 2  Afraid - awful might happen 0 0 0 0  Total GAD 7 Score 4 4 0 5  Anxiety Difficulty  Somewhat difficult Not difficult at all Somewhat difficult      Relevant past medical, surgical, family and social history reviewed and updated as indicated. Interim medical history since our last visit reviewed. Allergies and medications reviewed and updated.  Review of Systems  Constitutional:  Negative for activity change, appetite change, diaphoresis, fatigue and fever.  Respiratory:  Positive for cough, shortness of breath (at baseline) and wheezing (at baseline). Negative for chest tightness.   Cardiovascular:  Positive for leg swelling (occasional, improved). Negative for chest pain and palpitations.   Gastrointestinal: Negative.   Musculoskeletal:  Positive for arthralgias.  Neurological:  Positive for tremors. Negative for dizziness, seizures, syncope, facial asymmetry, weakness, light-headedness, numbness and headaches.  Psychiatric/Behavioral:  Negative for decreased concentration, self-injury, sleep disturbance and suicidal ideas. The patient is nervous/anxious.     Per HPI unless specifically indicated above     Objective:    BP 108/72 (BP Location: Left Wrist, Patient Position: Sitting)   Pulse 69   Temp 97.8 F (36.6 C) (Oral)   Resp 17   Ht 5' 7.01 (1.702 m)   Wt 227 lb (103 kg)   SpO2 95%   PF (!) 3 L/min   BMI 35.54 kg/m   Wt Readings from Last 3 Encounters:  03/20/24 227 lb (103 kg)  03/04/24 227 lb (103 kg)  02/13/24 227 lb (103 kg)    Physical Exam Vitals and nursing note reviewed.  Constitutional:      General: She is awake. She is not in acute distress.    Appearance: She is well-developed and well-groomed. She is obese. She is not ill-appearing or toxic-appearing.  HENT:     Head: Normocephalic.     Right Ear: Hearing and external ear normal.     Left Ear: Hearing and external ear normal.  Eyes:     General: Lids are normal.        Right eye: No discharge.        Left eye: No discharge.     Conjunctiva/sclera: Conjunctivae normal.     Pupils: Pupils are equal, round, and reactive to light.  Neck:     Thyroid : No thyromegaly.     Vascular: No carotid bruit.  Cardiovascular:     Rate and Rhythm: Normal rate and regular rhythm.     Heart sounds: Normal heart sounds. No murmur heard.    No gallop.  Pulmonary:     Effort: Pulmonary effort is normal. No accessory muscle usage or respiratory distress.     Breath sounds: No decreased air movement. Wheezing present. No decreased breath sounds or rales.     Comments: Scattered expiratory wheezes throughout, but improved lung sounds this visit. Abdominal:     General: Bowel sounds are normal. There is  no distension.     Palpations: Abdomen is soft.     Tenderness: There is no abdominal tenderness.  Musculoskeletal:     Cervical back: Normal range of motion and neck supple.     Right lower leg: No edema.     Left lower leg: No edema.  Lymphadenopathy:     Head:  Right side of head: No submental, submandibular, tonsillar, preauricular or posterior auricular adenopathy.     Left side of head: No submental, submandibular, tonsillar, preauricular or posterior auricular adenopathy.     Cervical: No cervical adenopathy.  Skin:    General: Skin is warm and dry.  Neurological:     Mental Status: She is alert and oriented to person, place, and time.     Cranial Nerves: Cranial nerves 2-12 are intact.     Motor: Weakness (BLE 3/5) and tremor (with activity noted most, upper body) present.     Coordination: Coordination is intact.     Gait: Gait is intact.     Deep Tendon Reflexes: Reflexes are normal and symmetric.     Reflex Scores:      Brachioradialis reflexes are 2+ on the right side and 2+ on the left side.      Patellar reflexes are 2+ on the right side and 2+ on the left side.    Comments: BUE strength 4/5. Can transfer from sitting to standing, but causes discomfort and slightly unsteady on feet. No rigidity or cogwheel. Oriented to person, place, and time. Some difficulty with short term recall noted.  Psychiatric:        Attention and Perception: Attention normal.        Mood and Affect: Mood normal.        Speech: Speech normal.        Behavior: Behavior normal. Behavior is cooperative.        Thought Content: Thought content normal.    Results for orders placed or performed in visit on 02/13/24  Bayer DCA Hb A1c Waived   Collection Time: 02/13/24  9:12 AM  Result Value Ref Range   HB A1C (BAYER DCA - WAIVED) 6.9 (H) 4.8 - 5.6 %  Comprehensive metabolic panel with GFR   Collection Time: 02/13/24  9:14 AM  Result Value Ref Range   Glucose 118 (H) 70 - 99 mg/dL   BUN 23  8 - 27 mg/dL   Creatinine, Ser 8.89 (H) 0.57 - 1.00 mg/dL   eGFR 56 (L) >40 fO/fpw/8.26   BUN/Creatinine Ratio 21 12 - 28   Sodium 139 134 - 144 mmol/L   Potassium 4.8 3.5 - 5.2 mmol/L   Chloride 91 (L) 96 - 106 mmol/L   CO2 33 (H) 20 - 29 mmol/L   Calcium  9.8 8.7 - 10.3 mg/dL   Total Protein 6.5 6.0 - 8.5 g/dL   Albumin 4.4 3.9 - 4.9 g/dL   Globulin, Total 2.1 1.5 - 4.5 g/dL   Bilirubin Total 0.3 0.0 - 1.2 mg/dL   Alkaline Phosphatase 100 49 - 135 IU/L   AST 13 0 - 40 IU/L   ALT 8 0 - 32 IU/L  Lipid Panel w/o Chol/HDL Ratio   Collection Time: 02/13/24  9:14 AM  Result Value Ref Range   Cholesterol, Total 141 100 - 199 mg/dL   Triglycerides 777 (H) 0 - 149 mg/dL   HDL 44 >60 mg/dL   VLDL Cholesterol Cal 36 5 - 40 mg/dL   LDL Chol Calc (NIH) 61 0 - 99 mg/dL  Vitamin B12   Collection Time: 02/13/24  9:14 AM  Result Value Ref Range   Vitamin B-12 1,462 (H) 232 - 1,245 pg/mL  RPR   Collection Time: 02/13/24  9:14 AM  Result Value Ref Range   RPR Ser Ql Non Reactive Non Reactive  CBC with Differential/Platelet   Collection Time: 02/13/24  9:14 AM  Result Value Ref Range   WBC 10.7 3.4 - 10.8 x10E3/uL   RBC 4.88 3.77 - 5.28 x10E6/uL   Hemoglobin 13.9 11.1 - 15.9 g/dL   Hematocrit 54.8 65.9 - 46.6 %   MCV 92 79 - 97 fL   MCH 28.5 26.6 - 33.0 pg   MCHC 30.8 (L) 31.5 - 35.7 g/dL   RDW 87.4 88.2 - 84.5 %   Platelets 259 150 - 450 x10E3/uL   Neutrophils 76 Not Estab. %   Lymphs 15 Not Estab. %   Monocytes 6 Not Estab. %   Eos 1 Not Estab. %   Basos 1 Not Estab. %   Neutrophils Absolute 8.2 (H) 1.4 - 7.0 x10E3/uL   Lymphocytes Absolute 1.7 0.7 - 3.1 x10E3/uL   Monocytes Absolute 0.6 0.1 - 0.9 x10E3/uL   EOS (ABSOLUTE) 0.1 0.0 - 0.4 x10E3/uL   Basophils Absolute 0.1 0.0 - 0.2 x10E3/uL   Immature Granulocytes 1 Not Estab. %   Immature Grans (Abs) 0.1 0.0 - 0.1 x10E3/uL  TSH   Collection Time: 02/13/24  9:14 AM  Result Value Ref Range   TSH 0.828 0.450 - 4.500 uIU/mL       Assessment & Plan:   Problem List Items Addressed This Visit       Other   Tremor   Ongoing for several months, ?if more essential tremor based on exam. Refuses MRI due to OOP cost. Stop Propranolol  as no benefit and due to COPD do not want to increase. Consider neurology referral in future if ongoing issues, refuses this today.       Obesity, morbid (HCC) - Primary   BMI 35.54 with COPD, HTN, HLD, T2DM.  Recommended eating smaller high protein, low fat meals more frequently and exercising 30 mins a day 5 times a week with a goal of 10-15lb weight loss in the next 3 months. Patient voiced their understanding and motivation to adhere to these recommendations.       Memory changes   Over past several months per her friend. 6CIT 2 last visit, some short term recall difficulty noted. Labs overal reassuring recent check. She refuses MRI due to cost OOP. Consider addition of memory care medication in future. Have recommended neurology, refuses visit with them at this time.      Chronic bilateral low back pain   Chronic with multiple surgeries, last September 2024.  At this time will continue Duloxetine  which is offering benefit to mood and pain and increase Lyrica  to 100 MG BID since is tolerating this medication, did not get benefit from Gabapentin in past.  Educated her on changes and medications + side effects.  Recommend stretching at home daily and movement.  Continue Lidocaine  patches as needed.  Recommend a referral to physiatry, she refuses this or PT in home. Will check on scooter order from November, as she had not heard from anyone.      Relevant Medications   pregabalin  (LYRICA ) 100 MG capsule     Follow up plan: Return in about 4 weeks (around 04/17/2024) for Neuropathy.

## 2024-03-20 NOTE — Addendum Note (Signed)
 Addended by: Kellin Bartling T on: 03/20/2024 01:14 PM   Modules accepted: Orders

## 2024-03-20 NOTE — Assessment & Plan Note (Signed)
 BMI 35.54 with COPD, HTN, HLD, T2DM.  Recommended eating smaller high protein, low fat meals more frequently and exercising 30 mins a day 5 times a week with a goal of 10-15lb weight loss in the next 3 months. Patient voiced their understanding and motivation to adhere to these recommendations.

## 2024-03-20 NOTE — Assessment & Plan Note (Addendum)
 Chronic with multiple surgeries, last September 2024.  At this time will continue Duloxetine  which is offering benefit to mood and pain and increase Lyrica  to 100 MG BID since is tolerating this medication, did not get benefit from Gabapentin in past.  Educated her on changes and medications + side effects.  Recommend stretching at home daily and movement.  Continue Lidocaine  patches as needed.  Recommend a referral to physiatry, she refuses this or PT in home. Will check on scooter order from November, as she had not heard from anyone.

## 2024-03-20 NOTE — Assessment & Plan Note (Addendum)
 Ongoing for several months, ?if more essential tremor based on exam. Refuses MRI due to [facility] cost. Stop Propranolol  as no benefit and due to COPD do not want to increase. Consider neurology referral in future if ongoing issues, refuses this today.

## 2024-04-19 ENCOUNTER — Ambulatory Visit: Admitting: Nurse Practitioner

## 2024-04-27 NOTE — Patient Instructions (Signed)
 Be Involved in Caring For Your Health:  Taking Medications When medications are taken as directed, they can greatly improve your health. But if they are not taken as prescribed, they may not work. In some cases, not taking them correctly can be harmful. To help ensure your treatment remains effective and safe, understand your medications and how to take them. Bring your medications to each visit for review by your provider.  Your lab results, notes, and after visit summary will be available on My Chart. We strongly encourage you to use this feature. If lab results are abnormal the clinic will contact you with the appropriate steps. If the clinic does not contact you assume the results are satisfactory. You can always view your results on My Chart. If you have questions regarding your health or results, please contact the clinic during office hours. You can also ask questions on My Chart.  We at [facility] are grateful that you chose us  to provide your care. We strive to provide evidence-based and compassionate care and are always looking for feedback. If you get a survey from the clinic please complete this so we can hear your opinions.   Nerve Damage (Peripheral Neuropathy): What to Know Peripheral neuropathy happens when there's damage to the peripheral nerves. These nerves send signals between your spinal cord and your arms and legs. You can have damage in one or more nerves. What are the causes? There are many reasons why nerves can get damaged. Damage may be caused by some diseases, such as: Diabetes. This is the most common cause. Autoimmune diseases, such as rheumatoid arthritis or lupus. These happen when your body's defense system (immune system) attacks healthy parts of your body by mistake. Inherited nerve disease. This is passed down in families. Kidney disease. Thyroid disease. Other causes include: Pressure or stress on a nerve that lasts a long time. Lack of B  vitamins from poor diet or drinking too much alcohol. Infections. Some medicines, like chemotherapy used for cancer treatment. Poisonous (toxic) substances, such as lead or mercury. Too little blood flowing to the legs. Sometimes, the cause isn't known. What are the signs or symptoms? Symptoms depend on which nerves are damaged. In your legs, hands, or arms, you may have: Loss of feeling (numbness). Tingling. Burning pain. Very sensitive skin. Weakness. Paralysis. This is when you can't move a part of your body. Clumsiness. Muscle twitching. Loss of balance. You may have trouble walking. Other symptoms can include: Not being able to control when you pee. Feeling dizzy. Problems during sex. How is this diagnosed? Your health care provider will: Ask about your symptoms and medical history. Do a physical exam. Do a neurological exam. They will check your reflexes, how you move, and what you can feel. You may need to have other tests, such as: Blood tests. Nerve tests to check how well your nerves and muscles work. Imaging tests, such as a CT scan or MRI. These are done to rule out other causes. Nerve biopsy. This is when a small piece of a nerve is removed for testing. Lumbar puncture. A small amount of the fluid that surrounds the brain and spinal cord is taken and checked in a lab. How is this treated? Treatment may include: Treating the cause of the nerve damage. Pain medicines. Other medicines that may help with pain, such as: Medicines that treat seizures. Medicines that treat depression. Pain patches or creams that are put on painful areas of skin. TENS therapy. This uses  a device that sends mild electrical pulses to your nerves. This will prevent pain signals from reaching the brain. Massage. Acupuncture. Surgery to relieve pressure on a nerve or to destroy a nerve that's causing pain. This is done only if the other treatments are not helping. You may also need  physical therapy to help improve your movement, balance, and muscle strength. You may be told to use a cane or walker if needed. Follow these instructions at home: Medicines Take your medicines only as told. You may need to take steps to help treat or prevent trouble pooping (constipation), such as: Taking medicines to help you poop. Eating foods high in fiber, like beans, whole grains, and fresh fruits and vegetables. Drinking more fluids as told. Ask your provider if it's safe to drive or use machines while taking your medicine. Lifestyle  Do not smoke, vape, or use nicotine or tobacco. Avoid or limit alcohol. Too much alcohol can be harmful to nerves and cause damage. Eat a healthy diet. Safety  Take care to avoid burning or damaging your skin if you have numbness. If you have numbness in your feet: Check your feet each day for signs of injury or infection. Watch for redness, warmth, and swelling. Wear padded socks and comfortable shoes. These help protect your feet. General instructions If you have diabetes, keep your blood sugar under control. Develop a good support system. Living with nerve damage can cause a lot of stress. Talk with a mental health therapist or join a support group. Exercise as told. Ask what things are safe for you to do at home. Work with a pain specialist to find the best way to manage your pain. Keep all follow-up visits. Your provider will check if the treatments are working and change them if needed. Where to find support The Foundation for Peripheral Neuropathy: budgetmaniac.si Where to find more information To learn more, go to: [employer] of Neurological Disorders and Stroke at basicfm.no. Click Search and type peripheral neuropathy. Find the link you need. Contact a health care provider if: Your pain treatments are not working. Your symptoms are getting worse. You have side effects from any of your  medicines. You have new symptoms. You're having a hard time coping with your condition. Get help right away if: You have a wound that's not healing. You have new weakness in an arm or leg. You've fallen or you fall often. This information is not intended to replace advice given to you by your health care provider. Make sure you discuss any questions you have with your health care provider. Document Revised: 06/15/2023 Document Reviewed: 06/15/2023 Elsevier Patient Education  2025 [employer].

## 2024-04-30 ENCOUNTER — Ambulatory Visit: Payer: Self-pay

## 2024-04-30 NOTE — Telephone Encounter (Signed)
 Pt scheduled 05/02/24 to have oxygen  dosing adjusted.   Reason for Disposition  Requesting regular office appointment  Answer Assessment - Initial Assessment Questions Pt's sister Deneen) calling in today to see if Dr. Tamea can increase the pt's oxygen  from 3 to 4. Newell states the pt is not experiencing any symptoms but needs more assistance with her oxygen . Pt scheduled 05/01/24.  1. REASON FOR CALL: What is the main reason for your call? or How can I best help you?     To schedule appt  2. SYMPTOMS : Do you have any symptoms?      Denies 3. OTHER QUESTIONS: Do you have any other questions?     no  Protocols used: Information Only Call - No Triage-A-AH

## 2024-04-30 NOTE — Telephone Encounter (Signed)
 Noted. Nothing further needed.

## 2024-05-01 ENCOUNTER — Encounter: Payer: Self-pay | Admitting: Nurse Practitioner

## 2024-05-01 ENCOUNTER — Ambulatory Visit: Admitting: Nurse Practitioner

## 2024-05-01 VITALS — BP 101/64 | HR 72 | Temp 97.9°F | Ht 67.0 in | Wt 221.0 lb

## 2024-05-01 DIAGNOSIS — F17219 Nicotine dependence, cigarettes, with unspecified nicotine-induced disorders: Secondary | ICD-10-CM

## 2024-05-01 DIAGNOSIS — Z7409 Other reduced mobility: Secondary | ICD-10-CM | POA: Diagnosis not present

## 2024-05-01 DIAGNOSIS — E1151 Type 2 diabetes mellitus with diabetic peripheral angiopathy without gangrene: Secondary | ICD-10-CM

## 2024-05-01 DIAGNOSIS — I152 Hypertension secondary to endocrine disorders: Secondary | ICD-10-CM | POA: Diagnosis not present

## 2024-05-01 DIAGNOSIS — E1159 Type 2 diabetes mellitus with other circulatory complications: Secondary | ICD-10-CM

## 2024-05-01 DIAGNOSIS — J432 Centrilobular emphysema: Secondary | ICD-10-CM | POA: Diagnosis not present

## 2024-05-01 MED ORDER — PREGABALIN 150 MG PO CAPS: 150.0000 mg | ORAL_CAPSULE | Freq: Two times a day (BID) | ORAL | 3 refills | Status: AC

## 2024-05-01 MED ORDER — VERAPAMIL HCL 40 MG PO TABS: 40.0000 mg | ORAL_TABLET | Freq: Every day | ORAL | 3 refills | Status: AC

## 2024-05-01 NOTE — Assessment & Plan Note (Addendum)
 Chronic, ongoing with A1c remains 6.9% last check.  Urine ALB 80 January 2025.  Recommend she continue Olmesartan  daily for kidney protection. In past she did not tolerate Metformin  at higher doses and did not tolerate GLP1 or SGLT2.  Check BS BID, bring to visits.  Continue to collaborate with vascular.  - Continue Januvia  and Tresiba , recommend to stay at 20 units for now but if gets consistent lows is to cut back on dosage. Melinda and her agree with this and able to verbalize understanding. - Increase Lyrica  to 150 MG BID, as is tolerating. She is aware that we may not be able to get neuropathy completely under control. Tolerates medication without ADR. - Continue to work with [facility] PharmD. - ARB and statin on board. - Vaccinations up to date. - Foot and eye exam up to date.

## 2024-05-01 NOTE — Assessment & Plan Note (Signed)
 Due to multiple chronic disease processes and pain, also uses portable O2. Mobility is affecting overall quality of life, including causing more depressed mood as cannot leave home often. Will work on obtaining a Jazzy Scooter for her to assist with mobility, have placed two orders for this. Would be able to transfer from chair or bed to scooter without issue.  Home PT ordered.

## 2024-05-01 NOTE — Assessment & Plan Note (Signed)
 Chronic, stable.  BP well below goal on exam, slightly soft.  Continue Olmesartan  5 MG daily for kidney protection and reduce Verapamil  to 40 MG daily + Lasix  PRN continue.  Recommend she monitor BP at least a few mornings a week at home and document.  DASH diet at home.  Labs next visit.

## 2024-05-01 NOTE — Assessment & Plan Note (Signed)
 BMI 35.61 with COPD, HTN, HLD, T2DM.  Recommended eating smaller high protein, low fat meals more frequently and exercising 30 mins a day 5 times a week with a goal of 10-15lb weight loss in the next 3 months. Patient voiced their understanding and motivation to adhere to these recommendations.

## 2024-05-01 NOTE — Assessment & Plan Note (Signed)
 I have recommended complete cessation of tobacco use. I have discussed various options available for assistance with tobacco cessation including over the counter methods (Nicotine gum, patch and lozenges). We also discussed prescription options (Chantix, Nicotine Inhaler / Nasal Spray). The patient is not interested in pursuing any prescription tobacco cessation options at this time.  Continue yearly lung screening.

## 2024-05-01 NOTE — Assessment & Plan Note (Addendum)
 Chronic, progressive.  - Will continue inhaler regimen.  Discussed at length with her.  Continue Albuterol  as needed.   - Recommend complete cessation of smoking.   - Continue annual CT scans.   - Continue collaboration with pulmonary, recent note reviewed.   - Continue to wear oxygen  consistently at 3L [place].  .   - Work on obtaining [employer].

## 2024-05-01 NOTE — Progress Notes (Signed)
 "  BP 101/64   Pulse 72   Temp 97.9 F (36.6 C) (Oral)   Ht 5' 7 (1.702 m)   Wt 221 lb (100.2 kg)   SpO2 94%   BMI 34.61 kg/m    Subjective:    Patient ID: Stacey Gross, female    DOB: [birth year], 63 y.o.   MRN: [id number]  HPI: Stacey Gross is a 64 y.o. female  Chief Complaint  Patient presents with   Diabetic Neuropathy    Patient states that the neuropathy is not any better.    NEUROPATHY A1c in November was 6.9%. Increased Lyrica  to 100 MG BID on 03/20/24. Has not heard about scooter which has been ordered twice now, last on 02/13/24. Lyrica  dose change not helping. Does continue to smoke daily.  Is requesting a Jazzy Scooter order. History of multiple fusions to back. Has been unable to ambulate well for over a year, becomes fatigued easily due to COPD and has pain which impedes her mobility.  Patient can transfer independently.  A manual w/c would be difficult for her to utilize as well, she enjoys being active to her highest level and this would decrease her current independence as becomes fatigued easily with strenuous activity + has portable O2 she uses. Would be dependent on a scooter to assist with ADLs.  She has control of upper body and is able to maneuver an scooter + cognitively understands how to safely use the scooter.  Without an scooter her MRADLs would be significantly affected, as she would be unable to transfer to areas in her home to cook and bath + would not be able to attend provider appointments to assess her overall health.  She currently has difficulty performing home needs and getting groceries due to mobility. Her mood has become greatly affected as she is unable to leave house often. A scooter would improve her overall quality of life and allow her easier mobility with her portable O2.  Neuropathy status: uncontrolled  Satisfied with current treatment?: yes Medication side effects: no Medication compliance:  good compliance Location: both feet Pain:  yes Severity: 10/10  Quality:  aching, burning, tingling, and pins and needles Frequency: constant Bilateral: yes Symmetric: yes Numbness: yes Decreased sensation: yes Weakness: yes Context: no change Alleviating factors: nothing Aggravating factors: unknown Treatments attempted: Gabapentin, Lyrica , OTC supplements  Relevant past medical, surgical, family and social history reviewed and updated as indicated. Interim medical history since our last visit reviewed. Allergies and medications reviewed and updated.  Review of Systems  Constitutional:  Positive for fatigue. Negative for activity change, appetite change, diaphoresis and fever.  Respiratory:  Positive for cough and shortness of breath (improving today). Negative for chest tightness and wheezing.   Cardiovascular:  Negative for chest pain, palpitations and leg swelling (improved).  Gastrointestinal: Negative.   Neurological:  Positive for weakness and numbness. Negative for dizziness, light-headedness and headaches.  Psychiatric/Behavioral: Negative.      Per HPI unless specifically indicated above     Objective:    BP 101/64   Pulse 72   Temp 97.9 F (36.6 C) (Oral)   Ht 5' 7 (1.702 m)   Wt 221 lb (100.2 kg)   SpO2 94%   BMI 34.61 kg/m   Wt Readings from Last 3 Encounters:  05/01/24 221 lb (100.2 kg)  03/20/24 227 lb (103 kg)  03/04/24 227 lb (103 kg)    Physical Exam Vitals and nursing note reviewed.  Constitutional:  General: She is awake. She is not in acute distress.    Appearance: She is well-developed and well-groomed. She is obese. She is not ill-appearing or toxic-appearing.  HENT:     Head: Normocephalic.     Right Ear: Hearing and external ear normal.     Left Ear: Hearing and external ear normal.  Eyes:     General: Lids are normal.        Right eye: No discharge.        Left eye: No discharge.     Conjunctiva/sclera: Conjunctivae normal.     Pupils: Pupils are equal, round, and  reactive to light.  Neck:     Thyroid : No thyromegaly.     Vascular: No carotid bruit.  Cardiovascular:     Rate and Rhythm: Normal rate and regular rhythm.     Heart sounds: Normal heart sounds. No murmur heard.    No gallop.  Pulmonary:     Effort: Pulmonary effort is normal. No accessory muscle usage or respiratory distress.     Breath sounds: Wheezing present. No decreased breath sounds or rales.     Comments: Intermittent expiratory wheezes noted throughout. Abdominal:     General: Bowel sounds are normal. There is no distension.     Palpations: Abdomen is soft.     Tenderness: There is no abdominal tenderness.  Musculoskeletal:     Cervical back: Normal range of motion and neck supple.     Right lower leg: No edema.     Left lower leg: No edema.     Comments: Hand strength 4/5 and BLE strength 3/5.  Lymphadenopathy:     Cervical: No cervical adenopathy.  Skin:    General: Skin is warm and dry.  Neurological:     Mental Status: She is alert and oriented to person, place, and time.     Deep Tendon Reflexes: Reflexes are normal and symmetric.     Reflex Scores:      Brachioradialis reflexes are 2+ on the right side and 2+ on the left side.      Patellar reflexes are 2+ on the right side and 2+ on the left side. Psychiatric:        Attention and Perception: Attention normal.        Mood and Affect: Mood normal.        Speech: Speech normal.        Behavior: Behavior normal. Behavior is cooperative.        Thought Content: Thought content normal.    Results for orders placed or performed in visit on 02/13/24  Bayer DCA Hb A1c Waived   Collection Time: 02/13/24  9:12 AM  Result Value Ref Range   HB A1C (BAYER DCA - WAIVED) 6.9 (H) 4.8 - 5.6 %  Comprehensive metabolic panel with GFR   Collection Time: 02/13/24  9:14 AM  Result Value Ref Range   Glucose 118 (H) 70 - 99 mg/dL   BUN 23 8 - 27 mg/dL   Creatinine, Ser 8.89 (H) 0.57 - 1.00 mg/dL   eGFR 56 (L) >40  fO/fpw/8.26   BUN/Creatinine Ratio 21 12 - 28   Sodium 139 134 - 144 mmol/L   Potassium 4.8 3.5 - 5.2 mmol/L   Chloride 91 (L) 96 - 106 mmol/L   CO2 33 (H) 20 - 29 mmol/L   Calcium  9.8 8.7 - 10.3 mg/dL   Total Protein 6.5 6.0 - 8.5 g/dL   Albumin 4.4 3.9 - 4.9 g/dL  Globulin, Total 2.1 1.5 - 4.5 g/dL   Bilirubin Total 0.3 0.0 - 1.2 mg/dL   Alkaline Phosphatase 100 49 - 135 IU/L   AST 13 0 - 40 IU/L   ALT 8 0 - 32 IU/L  Lipid Panel w/o Chol/HDL Ratio   Collection Time: 02/13/24  9:14 AM  Result Value Ref Range   Cholesterol, Total 141 100 - 199 mg/dL   Triglycerides 777 (H) 0 - 149 mg/dL   HDL 44 >60 mg/dL   VLDL Cholesterol Cal 36 5 - 40 mg/dL   LDL Chol Calc (NIH) 61 0 - 99 mg/dL  Vitamin A87   Collection Time: 02/13/24  9:14 AM  Result Value Ref Range   Vitamin B-12 1,462 (H) 232 - 1,245 pg/mL  RPR   Collection Time: 02/13/24  9:14 AM  Result Value Ref Range   RPR Ser Ql Non Reactive Non Reactive  CBC with Differential/Platelet   Collection Time: 02/13/24  9:14 AM  Result Value Ref Range   WBC 10.7 3.4 - 10.8 x10E3/uL   RBC 4.88 3.77 - 5.28 x10E6/uL   Hemoglobin 13.9 11.1 - 15.9 g/dL   Hematocrit 54.8 65.9 - 46.6 %   MCV 92 79 - 97 fL   MCH 28.5 26.6 - 33.0 pg   MCHC 30.8 (L) 31.5 - 35.7 g/dL   RDW 87.4 88.2 - 84.5 %   Platelets 259 150 - 450 x10E3/uL   Neutrophils 76 Not Estab. %   Lymphs 15 Not Estab. %   Monocytes 6 Not Estab. %   Eos 1 Not Estab. %   Basos 1 Not Estab. %   Neutrophils Absolute 8.2 (H) 1.4 - 7.0 x10E3/uL   Lymphocytes Absolute 1.7 0.7 - 3.1 x10E3/uL   Monocytes Absolute 0.6 0.1 - 0.9 x10E3/uL   EOS (ABSOLUTE) 0.1 0.0 - 0.4 x10E3/uL   Basophils Absolute 0.1 0.0 - 0.2 x10E3/uL   Immature Granulocytes 1 Not Estab. %   Immature Grans (Abs) 0.1 0.0 - 0.1 x10E3/uL  TSH   Collection Time: 02/13/24  9:14 AM  Result Value Ref Range   TSH 0.828 0.450 - 4.500 uIU/mL      Assessment & Plan:   Problem List Items Addressed This Visit        Cardiovascular and Mediastinum   Type 2 diabetes, controlled, with peripheral circulatory disorder (HCC)   Chronic, ongoing with A1c remains 6.9% last check.  Urine ALB 80 January 2025.  Recommend she continue Olmesartan  daily for kidney protection. In past she did not tolerate Metformin  at higher doses and did not tolerate GLP1 or SGLT2.  Check BS BID, bring to visits.  Continue to collaborate with vascular.  - Continue Januvia  and Tresiba , recommend to stay at 20 units for now but if gets consistent lows is to cut back on dosage. Melinda and her agree with this and able to verbalize understanding. - Increase Lyrica  to 150 MG BID, as is tolerating. She is aware that we may not be able to get neuropathy completely under control. Tolerates medication without ADR. - Continue to work with [facility] Pharm[facility]. - ARB and statin on board. - Vaccinations up to date. - Foot and eye exam up to date.      Relevant Medications   propranolol  (INDERAL ) 20 MG tablet   verapamil  (CALAN ) 40 MG tablet   Other Relevant Orders   Ambulatory referral to Home Health   Hypertension associated with diabetes (HCC)   Chronic, stable.  BP well below goal  on exam, slightly soft.  Continue Olmesartan  5 MG daily for kidney protection and reduce Verapamil  to 40 MG daily + Lasix  PRN continue.  Recommend she monitor BP at least a few mornings a week at home and document.  DASH diet at home.  Labs next visit.      Relevant Medications   propranolol  (INDERAL ) 20 MG tablet   verapamil  (CALAN ) 40 MG tablet     Respiratory   Centrilobular emphysema (HCC) - Primary   Chronic, progressive.  - Will continue inhaler regimen.  Discussed at length with her.  Continue Albuterol  as needed.   - Recommend complete cessation of smoking.   - Continue annual CT scans.   - Continue collaboration with pulmonary, recent note reviewed.   - Continue to wear oxygen  consistently at 3L [place].  .   - Work on obtaining [employer].       Relevant Orders   Ambulatory referral to Home Health     Nervous and Auditory   Nicotine  dependence, cigarettes, w unsp disorders   I have recommended complete cessation of tobacco use. I have discussed various options available for assistance with tobacco cessation including over the counter methods (Nicotine  gum, patch and lozenges). We also discussed prescription options (Chantix , Nicotine  Inhaler / Nasal Spray). The patient is not interested in pursuing any prescription tobacco cessation options at this time.  Continue yearly lung screening.         Other   Very poor mobility   Due to multiple chronic disease processes and pain, also uses portable O2. Mobility is affecting overall quality of life, including causing more depressed mood as cannot leave home often. Will work on obtaining a Jazzy Scooter for her to assist with mobility, have placed two orders for this. Would be able to transfer from chair or bed to scooter without issue.  Home PT ordered.      Relevant Orders   Ambulatory referral to Home Health   Obesity, morbid (HCC)   BMI 35.61 with COPD, HTN, HLD, T2DM.  Recommended eating smaller high protein, low fat meals more frequently and exercising 30 mins a day 5 times a week with a goal of 10-15lb weight loss in the next 3 months. Patient voiced their understanding and motivation to adhere to these recommendations.         Follow up plan: Return in about 4 weeks (around 05/29/2024) for T2DM, HTN/HLD, COPD, NEUROPATHY, Depression.      "

## 2024-05-02 ENCOUNTER — Other Ambulatory Visit: Payer: Self-pay | Admitting: Nurse Practitioner

## 2024-05-02 ENCOUNTER — Ambulatory Visit: Admitting: Pulmonary Disease

## 2024-05-02 ENCOUNTER — Encounter: Payer: Self-pay | Admitting: Pulmonary Disease

## 2024-05-02 VITALS — BP 100/64 | HR 66 | Temp 98.0°F | Ht 67.0 in | Wt 224.0 lb

## 2024-05-02 DIAGNOSIS — J209 Acute bronchitis, unspecified: Secondary | ICD-10-CM

## 2024-05-02 DIAGNOSIS — F17210 Nicotine dependence, cigarettes, uncomplicated: Secondary | ICD-10-CM | POA: Diagnosis not present

## 2024-05-02 DIAGNOSIS — J9611 Chronic respiratory failure with hypoxia: Secondary | ICD-10-CM

## 2024-05-02 DIAGNOSIS — J441 Chronic obstructive pulmonary disease with (acute) exacerbation: Secondary | ICD-10-CM

## 2024-05-02 MED ORDER — AMOXICILLIN-POT CLAVULANATE 875-125 MG PO TABS: 1.0000 | ORAL_TABLET | Freq: Two times a day (BID) | ORAL | 0 refills | Status: AC

## 2024-05-02 MED ORDER — PREDNISONE 20 MG PO TABS: 20.0000 mg | ORAL_TABLET | Freq: Every day | ORAL | 0 refills | Status: AC

## 2024-05-02 NOTE — Patient Instructions (Signed)
 VISIT SUMMARY:  During your visit, we discussed your worsening breathing difficulties, continued tobacco use, and the impact of your tremors on your mobility. We reviewed your current medications and supplemental oxygen  use, and we addressed your concerns about increasing your oxygen  flow. We also talked about the importance of quitting smoking to improve your respiratory health.  YOUR PLAN:  -COPD WITH ACUTE EXACERBATION AND ACUTE BRONCHITIS: COPD, or chronic obstructive pulmonary disease, is a chronic inflammatory lung disease that causes obstructed airflow from the lungs. You are experiencing an acute exacerbation, which means a sudden worsening of your symptoms, along with acute bronchitis, an inflammation of the bronchial tubes. We have prescribed a short course of prednisone  and an antibiotic to help clear the sputum and reduce inflammation. We also ordered a chest x-ray to evaluate your lung condition. Please continue using Breztri  and your nebulizer as prescribed.  -CHRONIC RESPIRATORY FAILURE WITH HYPOXIA: Chronic respiratory failure with hypoxia means your lungs are not getting enough oxygen  into your blood. You are currently using supplemental oxygen  at 3 liters per minute. We discussed temporarily increasing your oxygen  to 4 liters per minute during episodes of severe breathing difficulty, but be cautious to avoid prolonged use to prevent CO2 retention. We also recommended using paper tape to secure your oxygen  mask.  -TOBACCO DEPENDENCE, CIGARETTES: Tobacco dependence means you are addicted to smoking cigarettes. Quitting smoking is crucial to improve your respiratory health and slow the progression of COPD. We strongly advise you to stop using all tobacco products. We discussed the negative impact of smoking on your lungs and the benefits of quitting.  INSTRUCTIONS:  Please follow the prescribed course of prednisone  and antibiotics. Continue using Breztri  and your nebulizer as directed.  Use paper tape to secure your oxygen  mask and temporarily increase your oxygen  to 4 liters per minute during severe dyspnea, but avoid prolonged use. Schedule a chest x-ray as ordered. Consider rescheduling your lung cancer screening CT if it is covered by insurance. It is very important to quit smoking to improve your respiratory health.

## 2024-05-03 ENCOUNTER — Other Ambulatory Visit: Payer: Self-pay

## 2024-05-03 ENCOUNTER — Other Ambulatory Visit: Payer: Self-pay | Admitting: *Deleted

## 2024-05-03 DIAGNOSIS — E1159 Type 2 diabetes mellitus with other circulatory complications: Secondary | ICD-10-CM

## 2024-05-03 DIAGNOSIS — Z87891 Personal history of nicotine dependence: Secondary | ICD-10-CM

## 2024-05-03 DIAGNOSIS — F17210 Nicotine dependence, cigarettes, uncomplicated: Secondary | ICD-10-CM

## 2024-05-03 DIAGNOSIS — E1151 Type 2 diabetes mellitus with diabetic peripheral angiopathy without gangrene: Secondary | ICD-10-CM

## 2024-05-03 DIAGNOSIS — J432 Centrilobular emphysema: Secondary | ICD-10-CM

## 2024-05-03 DIAGNOSIS — E1169 Type 2 diabetes mellitus with other specified complication: Secondary | ICD-10-CM

## 2024-05-03 DIAGNOSIS — Z122 Encounter for screening for malignant neoplasm of respiratory organs: Secondary | ICD-10-CM

## 2024-05-03 DIAGNOSIS — I152 Hypertension secondary to endocrine disorders: Secondary | ICD-10-CM

## 2024-05-03 NOTE — Progress Notes (Signed)
 "  05/03/24  Patient ID: Stacey Gross, female   DOB: [birth year], 64 y.o.   MRN: [id number]  Subjective: Telephone visit to follow-up on management of chronic conditions  Diabetes -Current medications:  Januvia  100mg  daily, Tresiba  20 units at bedtime -Did not tolerate metformin  (significant diarrhea), GLP1's (significant GI upset), or Farxiga  (yeast infections) in the past -Using Libre3 for CGM -States she will have nocturnal lows occasionally; so if BG <100 at bedtime, she will not use Tresiba .  States it has been >1 week since she has used the Tresiba , but BG does fluctuate during the day going up to 200 at times -ACEi/ARB for cardiorenal protection:  olmesartan  5mg  daily -UACR 30-300 January 2025 -Statin for ASCVD risk reduction:  rosuvastatin  40mg  daily  Hypertension -Current medications:  furosemide  40mg  daily, olmesartan  5mg  daily, verapamil  40mg  daily -Verapamil  recently reduced to 40mg  from 80mg  due to hypotension; patient's sister is picking new dose up from pharmacy for her today -Patient does monitor home BP but uses automated wrist monitor, because she states upper arm cuffs bruise her arm -She endorses home BP is averaging 100/55; BP at PCP visit 1/28 was 101/64  COPD -Current medications:  albuterol  HFA as needed, Bevespi  2 puffs daily, albuterol  nebulizer solution as needed -Currently exacerbated, and patient was prescribed Augment and prednisone  by Dr. Tamea with pulmonology yesterday- she has not started these medications, but sister is picking up from pharmacy for her today -Good adherence to Bevespi  per patient, but she is still having to use Albuterol  HFA daily -Did not tolerate Breztri  (thrush) in the past -On 3L of oxygen  at home -Propranolol  20mg  recently added for tremor-patient advised to stop therapy in December due to lack of benefit and risk associated with COPD; but she has continued to take and endorses benefit in regard to decreased  tremor  Objective:  Lab Results  Component Value Date   HGBA1C 6.9 (H) 02/13/2024   HGBA1C 6.9 (H) 11/17/2023   HGBA1C 8.6 (H) 08/03/2023       Component Value Date/Time   NA 139 02/13/2024 0914   K 4.8 02/13/2024 0914   CL 91 (L) 02/13/2024 0914   CO2 33 (H) 02/13/2024 0914   GLUCOSE 118 (H) 02/13/2024 0914   GLUCOSE 291 (H) 04/21/2023 0859   BUN 23 02/13/2024 0914   CREATININE 1.10 (H) 02/13/2024 0914   CREATININE 0.79 07/02/2013 1422   CALCIUM  9.8 02/13/2024 0914   PROT 6.5 02/13/2024 0914   ALBUMIN 4.4 02/13/2024 0914   AST 13 02/13/2024 0914   AST 32 02/22/2017 0847   ALT 8 02/13/2024 0914   ALT 19 02/22/2017 0847   ALKPHOS 100 02/13/2024 0914   BILITOT 0.3 02/13/2024 0914   EGFR 56 (L) 02/13/2024 0914   GFRNONAA >60 04/21/2023 0859      Component Value Date/Time   CHOL 141 02/13/2024 0914   CHOL 194 02/22/2017 0847   TRIG 222 (H) 02/13/2024 0914   TRIG 227 (H) 02/22/2017 0847   HDL 44 02/13/2024 0914   CHOLHDL 4.1 05/13/2020 0934   VLDL 45 (H) 02/22/2017 0847   LDLCALC 61 02/13/2024 0914   LABVLDL 36 02/13/2024 0914   Assessment/Plan:   Diabetes -Controlled based on A1c <7% -BP at goal of <130/80 -LDL at goal of <70 -UACR not at goal, but due for follow-up lab -Advised patient to begin using Tresiba  at 10 units daily (since she has not been using regularly in at least 1 week) switching to morning dosing to see  if this will provide benefit in BG lowering during the day while preventing any lows overnight -Continue Januvia  100mg  daily -Continue CGM with Libre 3 -PCP again 2/25 and will be due for A1c, CMP, and UACR  Hypertension -Currently hypotensive -Start reduced verapamil  dosing of 40mg  daily ASAP; continue furosemide  40mg  daily and olmesartan  5mg  daily -Continue to monitor BP regularly- I do not recommend wrist monitors, but her home readings are very similar to office readings and she has not been able to purchase upper arm, automated  monitor -Continue regular follow-up with cardiology and PCP  COPD -Continue current regimen at this time -Start Augmentin  and prednisone  20mg  ASAP- prednisone  will likely increase BG readings -Do not miss doses of Bevespi  -Always make sure you have albuterol  rescue inhaler and nebulizer solution on hand to use as needed -Continue to follow-up with pulmonology and PCP -I recommend close monitoring with continued use of propranolol  20mg  for tremors.  This could be exacerbating COPD (and lowering BP)   Follow-up:  2/13   Stacey Gross, PharmD, DPLA  "

## 2024-05-06 ENCOUNTER — Encounter: Payer: Self-pay | Admitting: Pulmonary Disease

## 2024-05-09 ENCOUNTER — Ambulatory Visit: Admission: RE | Admit: 2024-05-09

## 2024-05-17 ENCOUNTER — Other Ambulatory Visit

## 2024-05-29 ENCOUNTER — Ambulatory Visit: Admitting: Nurse Practitioner

## 2024-07-05 ENCOUNTER — Ambulatory Visit: Admitting: Pulmonary Disease

## 2024-07-09 ENCOUNTER — Ambulatory Visit
